# Patient Record
Sex: Male | Born: 1935
Health system: Southern US, Community
[De-identification: ages and names within clinical notes are randomized; demographics above are authoritative.]

## PROBLEM LIST (undated history)

## (undated) DIAGNOSIS — C44219 Basal cell carcinoma of skin of left ear and external auricular canal: Secondary | ICD-10-CM

## (undated) DIAGNOSIS — G250 Essential tremor: Secondary | ICD-10-CM

## (undated) DIAGNOSIS — I2510 Atherosclerotic heart disease of native coronary artery without angina pectoris: Secondary | ICD-10-CM

## (undated) DIAGNOSIS — I10 Essential (primary) hypertension: Secondary | ICD-10-CM

## (undated) DIAGNOSIS — Z860100 Personal history of colon polyps, unspecified: Secondary | ICD-10-CM

## (undated) DIAGNOSIS — K219 Gastro-esophageal reflux disease without esophagitis: Secondary | ICD-10-CM

## (undated) DIAGNOSIS — M4802 Spinal stenosis, cervical region: Secondary | ICD-10-CM

## (undated) DIAGNOSIS — K8050 Calculus of bile duct without cholangitis or cholecystitis without obstruction: Secondary | ICD-10-CM

## (undated) DIAGNOSIS — E785 Hyperlipidemia, unspecified: Secondary | ICD-10-CM

## (undated) DIAGNOSIS — N1832 Chronic kidney disease, stage 3b: Secondary | ICD-10-CM

## (undated) DIAGNOSIS — K5900 Constipation, unspecified: Secondary | ICD-10-CM

## (undated) DIAGNOSIS — I129 Hypertensive chronic kidney disease with stage 1 through stage 4 chronic kidney disease, or unspecified chronic kidney disease: Secondary | ICD-10-CM

## (undated) DIAGNOSIS — C61 Malignant neoplasm of prostate: Secondary | ICD-10-CM

## (undated) DIAGNOSIS — J189 Pneumonia, unspecified organism: Secondary | ICD-10-CM

## (undated) DIAGNOSIS — I4510 Unspecified right bundle-branch block: Secondary | ICD-10-CM

## (undated) DIAGNOSIS — M549 Dorsalgia, unspecified: Secondary | ICD-10-CM

## (undated) DIAGNOSIS — E119 Type 2 diabetes mellitus without complications: Secondary | ICD-10-CM

## (undated) DIAGNOSIS — Z972 Presence of dental prosthetic device (complete) (partial): Secondary | ICD-10-CM

## (undated) DIAGNOSIS — N179 Acute kidney failure, unspecified: Secondary | ICD-10-CM

## (undated) DIAGNOSIS — Z973 Presence of spectacles and contact lenses: Secondary | ICD-10-CM

## (undated) HISTORY — DX: Gastro-esophageal reflux disease without esophagitis: K21.9

## (undated) HISTORY — DX: Essential (primary) hypertension: I10

## (undated) HISTORY — DX: Malignant neoplasm of prostate: C61

## (undated) HISTORY — DX: Hyperlipidemia, unspecified: E78.5

## (undated) HISTORY — PX: COLONOSCOPY: SHX174

## (undated) HISTORY — DX: Essential tremor: G25.0

## (undated) HISTORY — DX: Atherosclerotic heart disease of native coronary artery without angina pectoris: I25.10

---

## 1993-09-09 DIAGNOSIS — M109 Gout, unspecified: Secondary | ICD-10-CM

## 1993-09-09 DIAGNOSIS — I10 Essential (primary) hypertension: Secondary | ICD-10-CM | POA: Insufficient documentation

## 1993-09-09 HISTORY — DX: Essential (primary) hypertension: I10

## 2003-09-10 DIAGNOSIS — G250 Essential tremor: Secondary | ICD-10-CM

## 2003-09-10 HISTORY — DX: Essential tremor: G25.0

## 2004-02-15 HISTORY — PX: CYSTOSCOPY: SUR368

## 2004-11-07 ENCOUNTER — Encounter: Payer: Self-pay | Admitting: Family Medicine

## 2005-02-07 DIAGNOSIS — E119 Type 2 diabetes mellitus without complications: Secondary | ICD-10-CM

## 2005-04-11 ENCOUNTER — Ambulatory Visit: Payer: Self-pay | Admitting: Family Medicine

## 2005-07-12 ENCOUNTER — Ambulatory Visit: Payer: Self-pay | Admitting: Family Medicine

## 2005-08-06 ENCOUNTER — Ambulatory Visit: Payer: Self-pay | Admitting: Family Medicine

## 2005-08-06 LAB — CONVERTED CEMR LAB: Microalbumin U total vol: 17.9 mg/L

## 2005-08-09 ENCOUNTER — Ambulatory Visit: Payer: Self-pay | Admitting: Family Medicine

## 2005-09-09 DIAGNOSIS — I2510 Atherosclerotic heart disease of native coronary artery without angina pectoris: Secondary | ICD-10-CM

## 2005-09-09 DIAGNOSIS — C61 Malignant neoplasm of prostate: Secondary | ICD-10-CM

## 2005-09-09 HISTORY — DX: Atherosclerotic heart disease of native coronary artery without angina pectoris: I25.10

## 2005-09-09 HISTORY — PX: CORONARY ARTERY BYPASS GRAFT: SHX141

## 2005-09-09 HISTORY — DX: Malignant neoplasm of prostate: C61

## 2005-09-10 ENCOUNTER — Ambulatory Visit: Payer: Self-pay | Admitting: Family Medicine

## 2005-09-18 ENCOUNTER — Ambulatory Visit (HOSPITAL_COMMUNITY): Admission: RE | Admit: 2005-09-18 | Discharge: 2005-09-18 | Payer: Self-pay | Admitting: Urology

## 2005-09-18 HISTORY — PX: PROSTATE CRYOABLATION: SUR358

## 2005-12-08 DIAGNOSIS — E785 Hyperlipidemia, unspecified: Secondary | ICD-10-CM

## 2005-12-08 DIAGNOSIS — E782 Mixed hyperlipidemia: Secondary | ICD-10-CM | POA: Insufficient documentation

## 2005-12-08 HISTORY — DX: Hyperlipidemia, unspecified: E78.5

## 2005-12-31 ENCOUNTER — Ambulatory Visit: Payer: Self-pay | Admitting: Family Medicine

## 2006-01-02 ENCOUNTER — Ambulatory Visit: Payer: Self-pay | Admitting: Family Medicine

## 2006-01-06 ENCOUNTER — Ambulatory Visit: Payer: Self-pay | Admitting: Cardiovascular Disease

## 2006-01-08 ENCOUNTER — Ambulatory Visit: Payer: Self-pay | Admitting: Family Medicine

## 2006-01-15 ENCOUNTER — Ambulatory Visit: Payer: Self-pay | Admitting: Family Medicine

## 2006-01-24 ENCOUNTER — Ambulatory Visit: Payer: Self-pay

## 2006-01-24 ENCOUNTER — Inpatient Hospital Stay (HOSPITAL_COMMUNITY): Admission: EM | Admit: 2006-01-24 | Discharge: 2006-02-03 | Payer: Self-pay | Admitting: Emergency Medicine

## 2006-01-24 ENCOUNTER — Ambulatory Visit: Payer: Self-pay | Admitting: Internal Medicine

## 2006-01-24 HISTORY — PX: OTHER SURGICAL HISTORY: SHX169

## 2006-01-29 ENCOUNTER — Encounter: Payer: Self-pay | Admitting: Internal Medicine

## 2006-02-27 ENCOUNTER — Ambulatory Visit: Payer: Self-pay | Admitting: Family Medicine

## 2006-02-27 LAB — CONVERTED CEMR LAB
Hgb A1c MFr Bld: 6.1 %
Microalbumin U total vol: 7.7 mg/L

## 2006-03-03 ENCOUNTER — Ambulatory Visit: Payer: Self-pay | Admitting: Family Medicine

## 2006-03-04 ENCOUNTER — Ambulatory Visit: Payer: Self-pay | Admitting: Cardiovascular Disease

## 2006-03-27 ENCOUNTER — Ambulatory Visit: Payer: Self-pay | Admitting: Family Medicine

## 2006-03-27 ENCOUNTER — Ambulatory Visit: Payer: Self-pay | Admitting: Cardiovascular Disease

## 2006-05-01 ENCOUNTER — Ambulatory Visit: Payer: Self-pay | Admitting: Cardiovascular Disease

## 2006-06-02 ENCOUNTER — Ambulatory Visit: Payer: Self-pay | Admitting: Family Medicine

## 2006-06-05 ENCOUNTER — Ambulatory Visit: Payer: Self-pay | Admitting: Family Medicine

## 2006-08-06 ENCOUNTER — Encounter: Payer: Self-pay | Admitting: Family Medicine

## 2006-11-06 ENCOUNTER — Ambulatory Visit: Payer: Self-pay | Admitting: Cardiovascular Disease

## 2006-12-18 ENCOUNTER — Encounter: Payer: Self-pay | Admitting: Family Medicine

## 2006-12-18 DIAGNOSIS — R251 Tremor, unspecified: Secondary | ICD-10-CM

## 2006-12-18 DIAGNOSIS — Z8546 Personal history of malignant neoplasm of prostate: Secondary | ICD-10-CM | POA: Insufficient documentation

## 2006-12-18 DIAGNOSIS — I2510 Atherosclerotic heart disease of native coronary artery without angina pectoris: Secondary | ICD-10-CM | POA: Insufficient documentation

## 2006-12-18 DIAGNOSIS — Z87448 Personal history of other diseases of urinary system: Secondary | ICD-10-CM

## 2007-01-05 ENCOUNTER — Ambulatory Visit: Payer: Self-pay | Admitting: Family Medicine

## 2007-01-05 LAB — CONVERTED CEMR LAB
ALT: 15 units/L (ref 0–40)
Albumin: 3.5 g/dL (ref 3.5–5.2)
Alkaline Phosphatase: 55 units/L (ref 39–117)
Bilirubin, Direct: 0.2 mg/dL (ref 0.0–0.3)
CO2: 29 meq/L (ref 19–32)
Creatinine,U: 59.7 mg/dL
Eosinophils Absolute: 0.2 10*3/uL (ref 0.0–0.6)
Eosinophils Relative: 3.2 % (ref 0.0–5.0)
GFR calc non Af Amer: 63 mL/min
Glucose, Bld: 119 mg/dL — ABNORMAL HIGH (ref 70–99)
Hgb A1c MFr Bld: 6.2 % — ABNORMAL HIGH (ref 4.6–6.0)
LDL Cholesterol: 111 mg/dL — ABNORMAL HIGH (ref 0–99)
Lymphocytes Relative: 38.2 % (ref 12.0–46.0)
MCHC: 33.8 g/dL (ref 30.0–36.0)
Monocytes Absolute: 0.6 10*3/uL (ref 0.2–0.7)
Monocytes Relative: 9.5 % (ref 3.0–11.0)
Neutro Abs: 3 10*3/uL (ref 1.4–7.7)
Neutrophils Relative %: 49 % (ref 43.0–77.0)
PSA: 1.55 ng/mL (ref 0.10–4.00)
Platelets: 229 10*3/uL (ref 150–400)
Potassium: 4.5 meq/L (ref 3.5–5.1)
RBC: 4.14 M/uL — ABNORMAL LOW (ref 4.22–5.81)
RDW: 14.7 % — ABNORMAL HIGH (ref 11.5–14.6)
Sodium: 143 meq/L (ref 135–145)

## 2007-01-07 ENCOUNTER — Ambulatory Visit: Payer: Self-pay | Admitting: Family Medicine

## 2007-02-18 ENCOUNTER — Ambulatory Visit: Payer: Self-pay | Admitting: Family Medicine

## 2007-02-19 LAB — CONVERTED CEMR LAB: Cholesterol: 204 mg/dL (ref 0–200)

## 2007-04-02 ENCOUNTER — Encounter (INDEPENDENT_AMBULATORY_CARE_PROVIDER_SITE_OTHER): Payer: Self-pay | Admitting: *Deleted

## 2007-04-08 ENCOUNTER — Ambulatory Visit: Payer: Self-pay | Admitting: Family Medicine

## 2007-04-08 LAB — CONVERTED CEMR LAB
ALT: 15 units/L (ref 0–53)
Cholesterol: 116 mg/dL (ref 0–200)
HDL: 32.8 mg/dL — ABNORMAL LOW (ref >39.0)

## 2007-04-10 ENCOUNTER — Ambulatory Visit: Payer: Self-pay | Admitting: Family Medicine

## 2007-04-22 ENCOUNTER — Ambulatory Visit: Payer: Self-pay | Admitting: Cardiovascular Disease

## 2007-06-26 ENCOUNTER — Ambulatory Visit: Payer: Self-pay | Admitting: Family Medicine

## 2008-02-02 ENCOUNTER — Ambulatory Visit: Payer: Self-pay | Admitting: Family Medicine

## 2008-02-03 LAB — CONVERTED CEMR LAB
ALT: 15 units/L (ref 0–53)
AST: 25 units/L (ref 0–37)
Alkaline Phosphatase: 49 units/L (ref 39–117)
Basophils Absolute: 0 10*3/uL (ref 0.0–0.1)
Basophils Relative: 0.3 % (ref 0.0–1.0)
Bilirubin, Direct: 0.1 mg/dL (ref 0.0–0.3)
Calcium: 9.3 mg/dL (ref 8.4–10.5)
Cholesterol: 148 mg/dL (ref 0–200)
Eosinophils Absolute: 0.1 10*3/uL (ref 0.0–0.7)
Eosinophils Relative: 1.9 % (ref 0.0–5.0)
GFR calc Af Amer: 83 mL/min
GFR calc non Af Amer: 69 mL/min
HDL: 40.1 mg/dL (ref >39.0)
LDL Cholesterol: 79 mg/dL (ref 0–99)
Lymphocytes Relative: 31.9 % (ref 12.0–46.0)
MCHC: 33.7 g/dL (ref 30.0–36.0)
MCV: 101.8 fL — ABNORMAL HIGH (ref 78.0–100.0)
Monocytes Absolute: 0.4 10*3/uL (ref 0.1–1.0)
Monocytes Relative: 8.6 % (ref 3.0–12.0)
Potassium: 4.6 meq/L (ref 3.5–5.1)
RBC: 4.08 M/uL — ABNORMAL LOW (ref 4.22–5.81)
RDW: 14 % (ref 11.5–14.6)
Total Bilirubin: 1.1 mg/dL (ref 0.3–1.2)
Total CHOL/HDL Ratio: 3.7
VLDL: 29 mg/dL (ref 0–40)
WBC: 5.1 10*3/uL (ref 4.5–10.5)

## 2008-02-23 ENCOUNTER — Ambulatory Visit: Payer: Self-pay | Admitting: Family Medicine

## 2008-06-10 ENCOUNTER — Ambulatory Visit: Payer: Self-pay | Admitting: Cardiovascular Disease

## 2008-11-03 ENCOUNTER — Telehealth: Payer: Self-pay | Admitting: Family Medicine

## 2008-12-14 ENCOUNTER — Telehealth: Payer: Self-pay | Admitting: Family Medicine

## 2009-03-22 ENCOUNTER — Ambulatory Visit: Payer: Self-pay | Admitting: Family Medicine

## 2009-03-22 LAB — CONVERTED CEMR LAB
ALT: 13 units/L (ref 0–53)
BUN: 23 mg/dL (ref 6–23)
Basophils Absolute: 0 10*3/uL (ref 0.0–0.1)
Basophils Relative: 0.3 % (ref 0.0–3.0)
Bilirubin, Direct: 0.1 mg/dL (ref 0.0–0.3)
Calcium: 8.9 mg/dL (ref 8.4–10.5)
Chloride: 107 meq/L (ref 96–112)
Eosinophils Relative: 3.5 % (ref 0.0–5.0)
GFR calc non Af Amer: 52.07 mL/min (ref >60)
Glucose, Bld: 108 mg/dL — ABNORMAL HIGH (ref 70–99)
HCT: 38.2 % — ABNORMAL LOW (ref 39.0–52.0)
Hemoglobin: 13.1 g/dL (ref 13.0–17.0)
Hgb A1c MFr Bld: 6.4 % (ref 4.6–6.5)
LDL Cholesterol: 95 mg/dL (ref 0–99)
Lymphs Abs: 2.6 10*3/uL (ref 0.7–4.0)
Microalb, Ur: 62.4 mg/dL — ABNORMAL HIGH (ref 0.0–1.9)
Neutro Abs: 3 10*3/uL (ref 1.4–7.7)
Neutrophils Relative %: 46.3 % (ref 43.0–77.0)
PSA: 2.21 ng/mL (ref 0.10–4.00)
RDW: 13.9 % (ref 11.5–14.6)
Total Bilirubin: 1.2 mg/dL (ref 0.3–1.2)
Triglycerides: 137 mg/dL (ref 0.0–149.0)
Uric Acid, Serum: 6.1 mg/dL (ref 4.0–7.8)
VLDL: 27.4 mg/dL (ref 0.0–40.0)
WBC: 6.4 10*3/uL (ref 4.5–10.5)

## 2009-03-23 ENCOUNTER — Ambulatory Visit: Payer: Self-pay | Admitting: Family Medicine

## 2009-03-23 DIAGNOSIS — E559 Vitamin D deficiency, unspecified: Secondary | ICD-10-CM | POA: Insufficient documentation

## 2009-03-23 DIAGNOSIS — E039 Hypothyroidism, unspecified: Secondary | ICD-10-CM | POA: Insufficient documentation

## 2009-05-18 ENCOUNTER — Encounter (INDEPENDENT_AMBULATORY_CARE_PROVIDER_SITE_OTHER): Payer: Self-pay | Admitting: *Deleted

## 2009-07-25 ENCOUNTER — Encounter: Payer: Self-pay | Admitting: Family Medicine

## 2009-08-28 ENCOUNTER — Ambulatory Visit: Payer: Self-pay | Admitting: Family Medicine

## 2009-08-30 ENCOUNTER — Ambulatory Visit: Payer: Self-pay | Admitting: Family Medicine

## 2009-10-31 ENCOUNTER — Encounter (INDEPENDENT_AMBULATORY_CARE_PROVIDER_SITE_OTHER): Payer: Self-pay | Admitting: *Deleted

## 2010-03-21 ENCOUNTER — Telehealth (INDEPENDENT_AMBULATORY_CARE_PROVIDER_SITE_OTHER): Payer: Self-pay | Admitting: *Deleted

## 2010-03-23 ENCOUNTER — Ambulatory Visit: Payer: Self-pay | Admitting: Family Medicine

## 2010-03-23 LAB — CONVERTED CEMR LAB
ALT: 13 units/L (ref 0–53)
Albumin: 3.6 g/dL (ref 3.5–5.2)
Alkaline Phosphatase: 61 units/L (ref 39–117)
Bilirubin, Direct: 0.1 mg/dL (ref 0.0–0.3)
Calcium: 9 mg/dL (ref 8.4–10.5)
Chloride: 106 meq/L (ref 96–112)
Cholesterol: 170 mg/dL (ref 0–200)
Creatinine, Ser: 1.5 mg/dL (ref 0.4–1.5)
Creatinine,U: 36.8 mg/dL
Microalb Creat Ratio: 87 mg/g — ABNORMAL HIGH (ref 0.0–30.0)
Potassium: 4.3 meq/L (ref 3.5–5.1)
TSH: 5.18 microintl units/mL (ref 0.35–5.50)
Total Bilirubin: 0.8 mg/dL (ref 0.3–1.2)
Triglycerides: 188 mg/dL — ABNORMAL HIGH (ref 0.0–149.0)

## 2010-03-26 ENCOUNTER — Ambulatory Visit: Payer: Self-pay | Admitting: Family Medicine

## 2010-10-09 NOTE — Letter (Signed)
Summary: [place] No Show Letter  [place] [facility]  [address] [place], [place] [address]   Phone: [phone]  Fax: [phone]    10/31/2009 MRN: [id number]  TAVIN VERNET [address] [place], [place]  [address]   Dear Mr. Benzel,   Our records indicate that you missed your scheduled appointment with ___lab__________________ on __2.22.11__________.  Please contact this office to reschedule your appointment as soon as possible.  It is important that you keep your scheduled appointments with your physician, so we can provide you the best care possible.  Please be advised that there may be a charge for "no show" appointments.    Sincerely,   [place] [facility]

## 2010-10-09 NOTE — Progress Notes (Signed)
----   Converted from flag ---- ---- 03/21/2010 10:14 AM, Crawford Givens MD wrote: TSH 790.6 PSA v76.44 cmet, lipid, MALB, A1c 250.00   ---- 03/21/2010 10:09 AM, Mills Koller wrote: Patient coming in the AM for CPX labs, need orders and dx please, [name] ------------------------------

## 2010-10-09 NOTE — Assessment & Plan Note (Signed)
Summary: CPX / LFW   Vital Signs:  Patient profile:   77 year old male Height:      70 inches Weight:      204.75 pounds BMI:     29.48 Temp:     98.5 degrees F oral Pulse rate:   68 / minute Pulse rhythm:   regular BP sitting:   156 / 80  (left arm) Cuff size:   regular  Vitals Entered By: Delilah Shan CMA Estha Few Dull) (March 26, 2010 11:47 AM) CC: CPX.  Colonoscopy at [facility]? date, Preventive Care   History of Present Illness: Recent labs reviewed with patient.   Diabetes:  Using medications without difficulties: yes Hypoglycemic episodes:no Hyperglycemic episodes:no Feet problems:no Blood Sugars averaging:  ~110 in AM eye exam within last year: yes  Hypertension:      Using medication without problems or lightheadedness: yes Chest pain with exertion:no Edema:no Short of breath:no Average home BPs: usually 110-140s/75-85 Other issues: usually higher in MD office  Elevated Cholesterol: Using medications without problems:yes Muscle aches: occ.  no clear relation to statin.   Other complaints: no  Allergies: 1)  ! Sulfa  Past History:  Past Surgical History: Last updated: 01/07/2007 HOSP GLOMERULONEPHRITIS  "REST" 1965 ABD CT WITH & WITHOUT lEFT RENAL CYST 3-4 CM/02/15/2004 CYSTOSCOPY MOD BPH , MOD TRIBEC ? 02/15/2004 CRYOTHERAPY OF PROSTATE 2ND --PROSTATE CA. 09/18/2005 COLONOSCOPY/ DIVERTICS 2 POLYPS B=9 07/23/2000         10 yrs ADENOSINE MYOVIEW ISCHEMIA BY EKG--- CATH. 01/24/2006 CABG x 4 : 01/31/2006 ABD CT NEGATIVE AAA 01/25/2006  Family History: Last updated: 04-21-09 Father:DECEASED AGE 71 NATURAL CAUSES  Mother: DECEASED 55YOA ? HEART /MULTIPLE TUMORS INSIDE Siblings: 1 BROTHER DIED 69 YOA PROSTATE CANCER BROTHER A 62  BACK PROBLEMS HYPOTHYROID SISTERS 4 : 2 WITH DM CV: + MOTHER (HEART) HBP + SELF DM + SELF; SISTERS X 2 PROSTATE CANCER: + BROTHER, + SELF DEPRESSION: NEG. ETOH/DRUG ABUSE: NEG. STROKE: NEG.  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 RETIRED P[employer] Never Smoked Alcohol use-no Drug use-no  Risk Factors: Alcohol Use: 0 (04-21-2009) Caffeine Use: 1 (Apr 21, 2009) Exercise: yes (21-Apr-2009)  Risk Factors: Smoking Status: never (21-Apr-2009) Passive Smoke Exposure: no (Apr 21, 2009)  Past Medical History: Diabetes mellitus, type II:02/2005 Gout :1995 Hyperlipidemia04/2007 Hypertension: : 1995 benign tremor 2005 CAD s/p CABG Prostate CA s/p treatment, followed by urology.   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 RETIRED [employer] Never Smoked Alcohol use-no Drug use-no  Review of Systems       See HPI.  Otherwise noncontributory.  Feeling well o/w.   Physical Exam  General:  GEN: nad, alert and oriented HEENT: mucous membranes moist NECK: supple w/o LA CV: rrr.  no murmur PULM: ctab, no inc wob ABD: soft, +bs EXT: no edema SKIN: no acute rash  resting tremor noted episodically during exam.  No motor changes o/w.  DTRs wnl x4.  Prostate:  no nodules and no asymmetry.   stool heme neg  Diabetes Management Exam:    Foot Exam (with socks and/or shoes not present):       Sensory-Pinprick/Light touch:          Left medial foot (L-4): normal          Left dorsal foot (L-5): normal  Left lateral foot (S-1): normal          Right medial foot (L-4): normal          Right dorsal foot (L-5): normal          Right lateral foot (S-1): normal       Sensory-Monofilament:          Left foot: normal          Right foot: normal       Inspection:          Left foot: normal          Right foot: normal       Nails:          Left foot: normal          Right foot: normal   Impression & Recommendations:  Problem # 1:  Preventive Health Care  (ICD-V70.0) See below.  Up to date except for shingles vaccine.  Pt to check on coverage.   Problem # 2:  HYPERTENSION (ICD-401.9) No change in meds.  controlled based on outside numbers.  His updated medication list for this problem includes:    Coreg Cr 20 Mg Cp24 (Carvedilol phosphate) .Marland Kitchen... 1 daily by mouth    Lisinopril 20 Mg Tabs (Lisinopril) .Marland Kitchen... Take one by mouth daily  Problem # 3:  HYPERLIPIDEMIA (ICD-272.4) No change in meds.  His updated medication list for this problem includes:    Crestor 10 Mg Tabs (Rosuvastatin calcium) .Marland Kitchen... 1 tablet by mouth at bedtime  Problem # 4:  DIABETES MELLITUS, TYPE II (ICD-250.00) controlled, no change in meds.  MALB elevated; treatment is to continue to control DM2.   The following medications were removed from the medication list:    Avandia 4 Mg Tabs (Rosiglitazone maleate) .Marland Kitchen... 1daily by mouth His updated medication list for this problem includes:    Aspir-low 81 Mg Tbec (Aspirin) .Marland Kitchen... 1 daily by mouth    Lisinopril 20 Mg Tabs (Lisinopril) .Marland Kitchen... Take one by mouth daily    Actos 15 Mg Tabs (Pioglitazone hcl) ..... One tab by mouth once daily    Actos 15 Mg Tabs (Pioglitazone hcl) .Marland Kitchen... Take 1 tablet by mouth once a day  Complete Medication List: 1)  Aspir-low 81 Mg Tbec (Aspirin) .Marland Kitchen.. 1 daily by mouth 2)  Crestor 10 Mg Tabs (Rosuvastatin calcium) .Marland Kitchen.. 1 tablet by mouth at bedtime 3)  Allopurinol 300 Mg Tabs (Allopurinol) .Marland Kitchen.. 1 daily by mouth 4)  Coreg Cr 20 Mg Cp24 (Carvedilol phosphate) .Marland Kitchen.. 1 daily by mouth 5)  Nexium 40 Mg Cpdr (Esomeprazole magnesium) .... One tab 45 mins before brfst 6)  Mysoline 50 Mg Tabs (Primidone) .... 1/2 tablet daily by mouth 7)  Ascensia Contour Test Strp (Glucose blood) .... Use as directed 8)  Lisinopril 20 Mg Tabs (Lisinopril) .... Take one by mouth daily 9)  Actos 15 Mg Tabs (Pioglitazone hcl) .... One tab by mouth once daily 10)  Actos 15 Mg Tabs (Pioglitazone hcl) .... Take 1 tablet by mouth once  a day  Colorectal Screening:  Current Recommendations:    Hemoccult: NEG X 1 today  PSA Screening:    PSA: 2.90  (03/23/2010)  Immunization & Chemoprophylaxis:    Tetanus vaccine: Td  (04/09/2000)    Influenza vaccine: Fluvax 3+  (06/26/2007)    Pneumovax: Pneumovax  (07/10/2005)  Patient Instructions: 1)  Please schedule a follow-up appointment in 6 months.  Please stop taking the crestor for a few days see  if the muscle aches get better. If they do, let me know.  Check with your insurance to see if they will cover the shingles shot.   Prescriptions: ACTOS 15 MG TABS (PIOGLITAZONE HCL) one tab by mouth once daily  #90 x 4   Entered and Authorized by:   Crawford Givens MD   Signed by:   Crawford Givens MD on 03/26/2010   Method used:   Faxed to ...       [facility] Environmental education officer)       [address] [address]       [place], [place]  [address]       Ph: [phone]       Fax: [phone]   RxID:   [id number] LISINOPRIL 20 MG TABS (LISINOPRIL) take one by mouth daily  #90 x 4   Entered and Authorized by:   Crawford Givens MD   Signed by:   Crawford Givens MD on 03/26/2010   Method used:   Faxed to ...       [facility] Environmental education officer)       [address] [address]       [place], [place]  [address]       Ph: [phone]       Fax: [phone]   RxID:   [id number] ASCENSIA CONTOUR TEST   STRP (GLUCOSE BLOOD) use as directed  #100 x 0   Entered and Authorized by:   Crawford Givens MD   Signed by:   Crawford Givens MD on 03/26/2010   Method used:   Faxed to ...       [facility] Environmental education officer)       [address] [address]       [place], [place]  [address]       Ph: [phone]       Fax: [phone]   RxID:   [id number] MYSOLINE 50 MG TABS (PRIMIDONE) 1/2 tablet daily by mouth  #45 x 4   Entered and Authorized by:   Crawford Givens MD   Signed by:   Crawford Givens MD on 03/26/2010   Method used:   Faxed to ...       [facility] Environmental education officer)       [address] [address]       [place], [place]  [address]       Ph:  [phone]       Fax: [phone]   RxID:   [id number] NEXIUM 40 MG CPDR (ESOMEPRAZOLE MAGNESIUM) one tab 45 mins before brfst  #90 x 4   Entered and Authorized by:   Crawford Givens MD   Signed by:   Crawford Givens MD on 03/26/2010   Method used:   Faxed to ...       [facility] Environmental education officer)       [address] [address]       [place], [place]  [address]       Ph: [phone]       Fax: [phone]   RxID:   [id number] COREG CR 20 MG CP24 (CARVEDILOL PHOSPHATE) 1 daily by mouth  #90 x 4   Entered and Authorized by:   Crawford Givens MD   Signed by:   Crawford Givens MD on 03/26/2010   Method used:   Faxed to ...       [facility] Environmental education officer)       [address] [address]       [place], [place]  [address]       Ph: [phone]       Fax: [phone]  RxID:   [id number] ALLOPURINOL 300 MG TABS (ALLOPURINOL) 1 daily by mouth  #90 x 4   Entered and Authorized by:   Crawford Givens MD   Signed by:   Crawford Givens MD on 03/26/2010   Method used:   Faxed to ...       [facility] Environmental education officer)       [address] [address]       [place], [place]  [address]       Ph: [phone]       Fax: [phone]   RxID:   [id number] CRESTOR 10 MG TABS (ROSUVASTATIN CALCIUM) 1 tablet by mouth at bedtime  #90 x 4   Entered and Authorized by:   Crawford Givens MD   Signed by:   Crawford Givens MD on 03/26/2010   Method used:   Faxed to ...       [facility] Environmental education officer)       [address] [address]       [place], [place]  [address]       Ph: [phone]       Fax: [phone]   RxID:   [id number]   Current Allergies (reviewed today): ! SULFA      Prevention & Chronic Care Immunizations   Influenza vaccine: Fluvax 3+  (06/26/2007)   Influenza vaccine deferral: Not available  (03/26/2010)    Tetanus booster: 04/09/2000: Td    Pneumococcal vaccine: Pneumovax  (07/10/2005)    H. zoster vaccine: Not documented  Colorectal Screening   Hemoccult: Not documented   Hemoccult action/deferral: NEG X 1 today   (03/26/2010)    Colonoscopy: Not documented  Other Screening   PSA: 2.90  (03/23/2010)   Smoking status: never  (03/23/2009)  Diabetes Mellitus   HgbA1C: 6.2  (03/23/2010)    Eye exam: normal  (07/25/2009)   Eye exam due: 08/2010    Foot exam: yes  (03/26/2010)   High risk foot: Not documented   Foot care education: Not documented    Urine microalbumin/creatinine ratio: 87.0  (03/23/2010)  Lipids   Total Cholesterol: 170  (03/23/2010)   LDL: 88  (03/23/2010)   LDL Direct: 104.4  (02/18/2007)   HDL: 44.80  (03/23/2010)   Triglycerides: 188.0  (03/23/2010)    SGOT (AST): 17  (03/23/2010)   SGPT (ALT): 13  (03/23/2010)   Alkaline phosphatase: 61  (03/23/2010)   Total bilirubin: 0.8  (03/23/2010)    Lipid flowsheet reviewed?: Yes  Hypertension   Last Blood Pressure: 156 / 80  (03/26/2010)   Serum creatinine: 1.5  (03/23/2010)   Serum potassium 4.3  (03/23/2010)    Hypertension flowsheet reviewed?: Yes  Self-Management Support :    Diabetes self-management support: Not documented    Hypertension self-management support: Not documented    Lipid self-management support: Not documented

## 2011-01-22 NOTE — Assessment & Plan Note (Signed)
[facility]                            CARDIOLOGY OFFICE NOTE   NAME:[name] [name], [name] [name]                   MRN:          [id number]  DATE:06/10/2008                            DOB:          Feb 09, 1929    [name] is seen today in followup.  He is status post coronary  bypass surgery in 2006.  He had a nonischemic Myoview in 2007.  His  coronary risk factors include hypertension, diabetes, and  hyperlipidemia.   He sees Dr. Hetty Ely for his primary care needs.  He has been doing  well.  He is not having any significant chest pain, PND, orthopnea.  He  continues to golf on occasion.   His [name] recent LDL cholesterol was 111.   He is on Crestor 10 mg a day and has been compliant.   The patient's review of systems is remarkable for continued body tremor.  He is on primidone for this.   His sugars have been reasonable with hemoglobin A1c in the less than 7  range, blood pressure continues to be little bit elevated.  There is  some confusion in his lisinopril as to whether he is on 10 mg or 20 mg.  We will try to look this up in the computer, use as a transcript.  I  told him I thought he should probably be on little bit more.  He  continues to have microalbuminuria in his urine and I think increasing  his ACE inhibitor would be helpful here as well.   His meds include primidone 25 mg a day, Avandia 4 mg a day, Crestor 10 a  day, aspirin a day, lisinopril dose to be determined and doubled, and  Coreg CR 20 a day.   He has had a flu shot.   His exam is remarkable for weight that is up from 195-206, blood  pressure 160/80, pulse 60 and regular, respiratory rate 14, afebrile.  HEENT, unremarkable.  Carotids normal without bruit, no lymphadenopathy,  thyromegaly, or JVP elevation.  Lungs are clear, good diaphragmatic  motion.  No wheezing.  S1 and S2, normal heart sounds, PMI normal.  Abdomen is benign.  Bowel sounds positive.  No AAA, no  tenderness, no  bruit, no hepatosplenomegaly, hepatojugular reflux.  Distal pulses are  intact.  No edema.  Neuro, nonfocal.  Skin, warm and dry.  No muscular  weakness.   EKG shows sinus rhythm with right bundle-branch block.  No acute  changes.   IMPRESSION:  1. Coronary disease, previous coronary artery bypass graft.  Continue      aspirin, beta-blocker.  2. Hypertension, poorly controlled.  We will look up his lisinopril      dose in the EMR and double it.  He will continue low-salt diet.  3. Essential obesity and weight gain.  Decrease caloric intake,      increase exercise activity, diet prescription given.  4. Chronic right bundle-branch block, stable.  No evidence of high-      grade heart block or syncope.  5. Diabetes,  follow up with Dr. Hetty Ely, hemoglobin A1c in target  range.  6. Continued microalbuminuria, continue angiotensin-converting enzyme      inhibitor to protect kidneys.  7. Hyperlipidemia.  He may benefit from a switch to Vytorin.  We will      leave this up Dr. Hetty Ely, his target LDL in the setting of      coronary bypass surgery should be 70 or 80.  8. History of gout.  Continue allopurinol, no recent flares.  9. Body tremor.  Continue primidone, beta-blocker probably also helps,      no evidence of Parkinson disease.  10.Memory changes.  I have noticed with [name] [name] over the last 2 visits      he has significant memory problems.  I suspect he is developing      some Alzheimer disease.  I will leave it up to Dr. Hetty Ely to see      if he would like to start a trial of Aricept or Namenda.  I do not      think it is related to his tremor.     Noralyn Pick. Eden Emms, MD, [facility]  Electronically Signed    PCN/MedQ  DD: 06/10/2008  DT: 06/11/2008  Job #: [id number]

## 2011-01-22 NOTE — Assessment & Plan Note (Signed)
[facility]                            CARDIOLOGY OFFICE NOTE   NAME:Couchman, HANNAH CRILL                   MRN:          [id number]  DATE:04/22/2007                            DOB:          1929/01/04    Mr. Muckleroy returns today for followup. He is status post coronary  artery bypass surgery in 2006. He had a nonischemic Myoview in 2007. He  is a diabetic. His hemoglobin A1c has been 6.1. He has been doing well.  His blood pressure has always been a little bit elevated when he comes  to the office, but he takes it at home and it usually runs 120 systolic.   He has been compliant with his low cholesterol and low sugar diets. He  has been somewhat active, although the heat bothers him. He is playing a  bit of golf.   REVIEW OF SYSTEMS:  Is otherwise negative.   CURRENT MEDICATIONS:  1. Allopurinol 300 a day.  2. Primidone 25 a day.  3. Avandia 4 a day.  4. Crestor 10 a day.  5. Aspirin a day.  6. Lisinopril 20 a day.  7. Coreg CR 20 a day.  Last time I saw him, we increased his lisinopril to 20 a day for his  blood pressure.   PHYSICAL EXAMINATION:  Remarkable for an elderly, white male who looks  younger than his stated age. Affect is appropriate. Weight is 195. Blood  pressure 150/80, pulse 56 and regular. Respiratory rate is 14. He is  afebrile.  HEENT: Is normal.  NECK: Supple. There is no thyromegaly. No lymphadenopathy. No JVP  elevation. There are no bruits.  LUNGS:  Are clear with good diaphragmatic motion. No wheezing.  There is an S1, S2 with normal heart sounds. PMI is normal.  ABDOMEN: Is benign. There are no renal bruits. No tenderness. Bowel  sounds are positive. No hepatosplenomegaly or hepatojugular reflux.  Distal pulses are intact. No edema. Femorals are +3, PT's are +3.  NEURO: Nonfocal.  SKIN: Warm and dry.  There is no muscular weakness.   IMPRESSION:  1. Stable status post coronary artery bypass graft with no  angina. He      has nitroglycerine at home in case he needs to continue aspirin and      beta-blocker therapy. Nonischemic Myoview a year ago. No need for      repeat stress testing in an asymptomatic patient for probably 2-3      years.  2. Hypertension. Currently fairly well-controlled. I think there is a      bit of white coat hypertension here. He will continue to take his      blood pressure at home and refrain from salt.  3. Hyperlipidemia. Followup with Dr.  Hetty Ely. His last LFTs done in      July were normal. He will continue his Crestor.  4. Diabetes. Hemoglobin A1c in the excellent range of 6.1. Continue      current medication. Followup with Dr.  Hetty Ely for this as well.   Overall, I think he is doing well and I will see him back  in a year.  Refills on his prescriptions for 90 days of lisinopril 20 mg and Coreg  CR 20 were given.     Noralyn Pick. Eden Emms, MD, [facility]  Electronically Signed    PCN/MedQ  DD: 04/22/2007  DT: 04/23/2007  Job #: [id number]

## 2011-01-25 NOTE — Consult Note (Signed)
NAME:  [name] [name], COOPMAN NO.:  [id number]   MEDICAL RECORD NO.:  [id number]          PATIENT TYPE:  INP   LOCATION:  3702                         FACILITY:  [facility]   PHYSICIAN:  Kerin Perna, M.D.  DATE OF BIRTH:  Nov 29, 1928   DATE OF CONSULTATION:  01/28/2006  DATE OF DISCHARGE:                                   CONSULTATION   PRIMARY CARE PHYSICIAN:  Laurita Quint, M.D.   CONSULTANT:  Kerin Perna, M.D.   REASON FOR CONSULTATION:  Severe three-vessel coronary artery disease,  unstable angina.   CHIEF COMPLAINT:  Chest pain.   HISTORY OF PRESENT ILLNESS:  [name] [name] is a 77 year old gentleman who has  had several weeks of exertional dyspnea and chest tightness.  Associated  with these symptoms has been a decrease in exercise tolerance.  The patient  underwent a stress test using the standard Bruce protocol and developed  chest pain at a heart rate of only 100 beats per minute.  The patient had a  significantly abnormal EKG and the study was terminated.  No Cardiolite  imaging was performed.  The patient was then admitted to the hospital where  cardiac enzymes were checked and were negative.  He was placed on heparin  and nitroglycerin.  He underwent diagnostic cardiac catheterization  yesterday which demonstrated severe three-vessel coronary artery disease.  His proximal LAD had a 90% stenosis.  His proximal right coronary had a 95%  stenosis.  His circumflex had an 80% stenosis of the OM1 vessels and a 99%  stenosis of the distal posterolateral circumflex which filled via  collaterals.  His ejection fraction was 60%.  Left ventricular end-diastolic  pressure was 15 mmHg.  Because of the patient's symptoms and coronary  anatomy and severe multivessel disease, he was felt to be a candidate for  surgical revascularization.  The patient is currently stable on IV heparin  following his cardiac catheterization.  He underwent a CT of the abdomen due  to  potential abdominal aneurysm seem on aortogram at catheterization,  however, the CT scan is negative other than some renal cystic disease.  His  carotid duplex study shows no significant carotid stenosis.  A 2-D echo is  pending.   PAST MEDICAL HISTORY:  1.  Type 2 diabetes mellitus with a hemoglobin A1c of 7.1.  2.  Hypertension.  3.  Adenocarcinoma of the prostate status post cryotherapy, January 2007.  4.  Gout.  5.  History of glomerulonephritis 40 years ago with persistent microscopic      hematuria but normal BUN and creatinine.  6.  Benign tremor.  7.  No known drug allergies.   MEDICATIONS:  1.  Allopurinol 300 mg daily.  2.  Inderal 80 mg daily.  3.  Lisinopril HCT 20/12.5 daily.  4.  Primidone (Mysoline) 25 mg daily.  5.  Avandia 4 m daily.  6.  Lipitor 20 mg daily.  7.  Aspirin 325 mg daily.  8.  He is also on IV heparin protocol at this time.   SOCIAL HISTORY:  The patient is a retired Airline pilot.  He is married  and has children and grandchildren.  He never smoked and he does not drink.  His main activity now has been golf.   FAMILY HISTORY:  Positive for diabetes, positive for atherosclerotic heart  disease.   REVIEW OF SYSTEMS:  Constitutional review is negative for fever or weight  loss.  HEENT review is negative for active dental problems or difficulty  swallowing.  Musculoskeletal review is negative for chest trauma, broken  ribs or extremity fracture.  Cardiac review is positive for his unstable  angina, severe three-vessel coronary artery disease, negative for myocardial  infarction or cardiac murmur.  GI review is negative for hepatitis, jaundice  or blood per rectum.  Urologic review is positive for prostate cancer.  Status post cryotherapy of the prostate gland.  He also has some renal  cystic disease.  He also has microscopic hematuria from his  glomerulonephritis following a strep throat infection 40 years ago.  His  neurologic review is  negative for stroke or seizure and positive for a  benign tremor.  Vascular review is negative for DVT, claudication or TIA.   PHYSICAL EXAM:  The patient is 5 feet 10 inches and weighs 203 pounds.  Blood pressure is 126/70, pulse 70 and regular, respirations 18.  GENERAL APPEARANCE:  That of a pleasant white gentleman in no distress on IV  heparin.  HEENT:  Exam is normocephalic.  Full EOMs.  Pharynx clear.  Dentition  adequate.  NECK:  Without JVD, mass or carotid bruits.  LYMPHATICS:  Reveal no palpable supraclavicular or axillary adenopathy.  His breath sounds are clear and equal and there is no thoracic deformity.  CARDIAC:  Exam reveals a regular rhythm, without S3, gallop or murmur or  rub.  ABDOMEN:  Exam is soft, mildly obese, nontender without pulsatile mass or  organomegaly.  EXTREMITIES:  Reveal no deformity, edema or cyanosis.  Peripheral pulses are  2+ in all extremities and there is no venous insufficiency of the lower  extremities.  SKIN:  Without rash or lesion.  NEUROLOGIC:  Exam is alert and oriented without focal motor deficit.  He  does have a resting tremor.   LABORATORY DATA:  I reviewed the coronary arteriograms and his chest x-ray  and laboratory values.  He has severe three-vessel coronary artery disease  with 95% stenosis of the right coronary, 90% stenosis of the LAD and 80-90%  stenosis of the circumflex marginals.  His creatinine is 1.2 with BUN 16,  sodium 142.  His glucose is 127 with a hemoglobin A1c of 7.1.  His total  cholesterol is 235 with a triglycerides of 197 and HDL of 41.   IMPRESSION AND PLAN:  The patient would benefit from surgical  revascularization of his multivessel coronary artery disease.  His surgery  will be scheduled for May 24.  I discussed the details of surgery with the  patient as well  as the alternatives to surgical revascularization and the associated risks. At this time, he is in agreement to proceed with surgery on May  24.   Thank you for the consultation.      Kerin Perna, M.D.  Electronically Signed     PV/MEDQ  D:  01/28/2006  T:  01/28/2006  Job:  [id number]   cc:   [facility]   CVTS Office

## 2011-01-25 NOTE — H&P (Signed)
NAME:  [name] [name], [name] [name] NO.:  [id number]   MEDICAL RECORD NO.:  [id number]          PATIENT TYPE:  AMB   LOCATION:  DAY                          FACILITY:  [facility]   PHYSICIAN:  Pricilla Riffle, M.D.    DATE OF BIRTH:  [birth year]   DATE OF ADMISSION:  09/18/2005  DATE OF DISCHARGE:  09/18/2005                                HISTORY & PHYSICAL   HISTORY:  The patient is an 77 year old gentleman (birthday today) was  recently seen by Maurine Cane, M.D. in the clinic.  He had a history of  exertional dyspnea and chest tightness for 2 months.   He has no known coronary artery disease.  He has a history of hypertension.   The patient was set up for stress Myoview scan.  He has this done today.  Baseline EKG was normal.  He walked on the treadmill for 4 minutes 37  seconds to a peak heart rate of only 100.  Peak blood pressure 145/79.  His  legs became very tired and he actually jumped off the treadmill.  It was at  this time that he began having chest pain that eased off over several minute  in the recovery period after nitroglycerin was given.   EKG at about 3 minutes showed subtle ST changes in the anterior leads.  This  went on to more pronounced depression at 3 minutes 50 seconds in V-2 through  V-6, 1, 3, and F with worsening to downsloping ST depression, 1 to 1.1 to 2  mm in 1, 2, 3, F V-2 through V-6.  By 12 minutes in recovery, these changes  were nearly resolved.   The patient currently is pain free.   ALLERGIES:  No known drug allergies.   MEDICATIONS:  1.  Allopurinol 300 daily.  2.  Inderal 80 daily.  3.  Lisinopril hydrochlorothiazide 20/12.5 daily.  4.  Primidone 25 daily.  5.  Avandia 4 daily.   PAST MEDICAL HISTORY:  1.  Gout.  2.  Hypertension.  3.  Diabetes.   SOCIAL HISTORY:  The patient is retired from the [employer].  He is  married.  He has 1 daughter and 3 grandchildren.   REVIEW OF SYSTEMS:  All systems reviewed, negative to above  problems except  as noted.   PHYSICAL EXAMINATION:  GENERAL:  The patient is in no acute distress.  VITAL SIGNS:  Blood pressure 150/79, pulse 63, weight not taken.  NECK:  JVP is normal.  LUNGS:  Clear.  CARDIAC:  Regular rate and rhythm, S1 and S2, no S3 or S4 or significant  murmurs.  ABDOMEN:  Benign, obese.  EXTREMITIES:  Trace pulses.  No edema.   IMPRESSION:  Patient is a 77 year old gentleman with a history of chest  pain, dyspnea now presents for a stress Myoview scan, developed a concern  for claudication early that made him jump off of the treadmill.  He then  developed chest pain EKG changes that are very early on with only minimal  increase in heart rate to 100.  Most consistent with a borderline early  positive test.  Based on the above, I would recommend cardiac catheterization to find the  anatomy.  The patient has an IV in place and will begin intravenous  hydration.  Plan for transfer to [facility] for this.  The patient is  currently pain free.  We will check fasting lipid panel and we will also  check hemoglobin A1c.           ______________________________  Pricilla Riffle, M.D.     PVR/MEDQ  D:  01/24/2006  T:  01/24/2006  Job:  [id number]   cc:   Charlton Haws, M.D.  [address]  [address]  [place]  [place] [address]

## 2011-01-25 NOTE — Discharge Summary (Signed)
NAMEHAYDEN, [name] [name] NO.:  [id number]   MEDICAL RECORD NO.:  [id number]          PATIENT TYPE:  INP   LOCATION:  2023                         FACILITY:  [facility]   PHYSICIAN:  Kerin Perna, M.D.  DATE OF BIRTH:  October 04, 1928   DATE OF ADMISSION:  01/24/2006  DATE OF DISCHARGE:                                 DISCHARGE SUMMARY   ADMITTING DIAGNOSES:  Chest pain.   PAST MEDICAL HISTORY AND DISCHARGE DIAGNOSES:  1.  Type 2 diabetes mellitus with a hemoglobin A1c of 7.1.  2.  Hypertension.  3.  Adenocarcinoma of the prostate status post cryotherapy January 2007.  4.  Gout.  5.  History of glomerulonephritis 40 years ago with a persistent microscopic      hematuria, but normal BUN and creatinine.  6.  Benign tremor.  7.  Severe three vessel coronary artery disease with unstable angina status      post coronary artery bypass grafting x4.   ALLERGIES:  No known drug allergies.   BRIEF HISTORY:  The patient is a 77 year old male who presented with a  several week history of exertional dyspnea and chest tightness that was  associated with a decrease in exercise tolerance.  Patient underwent a  stress test using standard Bruce protocol at which time he developed a  significantly abnormal EKG and the study was terminated.  No Cardiolite  imaging was performed.  He was admitted to [facility] where cardiac enzymes  were checked and were found to be negative.  He was placed on heparin and  nitroglycerin and then underwent a diagnostic cardiac catheterization on Jan 27, 2006 which demonstrated severe three vessel coronary artery disease.  CVTS service was then consulted regarding surgical revascularization.   HOSPITAL COURSE:  The patient was admitted after a significantly positive  stress test as previously stated.  The patient was maintained on routine  hospital care and then taken for diagnostic cardiac catheterization on Jan 27, 2006 which revealed severe three  vessel coronary artery disease.  His  ejection fraction was noted to be 60%.  The patient then underwent a CT of  the abdomen secondary to a potential abdominal aortic aneurysm seen on  aortogram at the time of catheterization.  CT scan, however, was negative  other than some renal cystic disease.  Dr. Donata Clay of the CVTS service was  then consulted regarding surgical revascularization.  Dr. Donata Clay  evaluated patient on Jan 28, 2006.  It was his opinion that the patient  should proceed with coronary artery bypass grafting.   Patient was taken to the OR on Jan 30, 2006 for coronary artery bypass  grafting x4.  Angioscopic vessel harvesting was performed on the right  thigh.  Saphenous vein was grafted to OM1 in sequence to OM2, saphenous vein  was grafted to the distal right and the left internal mammary artery was  grafted to the LAD.  Patient tolerated the procedure well and was  hemodynamically stable immediately postoperatively.  Patient was transferred  from the OR to the SICU in stable condition.  Patient was extubated without  complication and woke up from anesthesia neurologically intact.   Patient's postoperative course has progressed as expected.  His diabetes  mellitus has been controlled throughout the postoperative course with  sliding scale insulin, Lantus insulin, as well as resumption of his Avandia.  The patient has been volume overloaded postoperatively and has been diuresed  accordingly and will require further diuresis as an outpatient.  On  postoperative day one he was afebrile with stable vital signs and  maintaining a normal sinus rhythm.  All invasive lines and chest tubes were  discontinued in a routine manner and he was ambulated.  The patient has  participated in cardiac rehabilitation since postoperative day one and has  continued to progress to a satisfactory level at this time.   On postoperative day three the patient is without complaint.  He is  afebrile  with stable vital signs and maintaining a normal sinus rhythm.  On physical  examination his wounds are clean and dry.  There is trace edema present in  the bilateral lower extremities and the lungs are clear.  Cardiac is regular  rate and rhythm.  The patient is in stable condition at this time and as  long as he continues to progress in the current manner should be ready for  discharge in the next one to two days pending morning round reevaluation.   LABORATORIES:  CBC and BMP on Feb 02, 2006:  White count 8.5, hemoglobin  8.7, hematocrit 26, platelets 145.  Sodium 136, potassium 3.8, BUN 19,  creatinine 1.4, glucose 75.   CONDITION ON DISCHARGE:  Improved.   INSTRUCTIONS:   MEDICATIONS:  1.  Aspirin 325 mg daily.  2.  Toprol XL 25 mg daily.  3.  Lisinopril/HCTZ 20/12.5 mg daily.  4.  Allopurinol 300 mg daily.  5.  Mysoline 25 mg daily.  6.  Avandia 4 mg daily.  7.  Lasix 40 mg daily x5 days.  8.  K-Dur 20 mEq daily x5 days.  9.  Tylox one to two q.4-6h. p.r.n. pain.   ACTIVITY:  No driving or lifting more than 10 pounds x3 weeks and the  patient should continue daily breathing and walking exercises.   DIET:  Low salt, low fat, and carbohydrate modified mean calorie.   WOUND CARE:  The patient may shower daily and clean the incisions with soap  and water.  If wound problems arise patient should contact CVTS office.   FOLLOW-UP:  Appointment with Dr. Eden Emms.  Patient will be instructed to  contact his office for an appointment two weeks after discharge at which  time a PA and lateral chest x-ray will be taken.  Dr. Donata Clay three weeks  after discharge.  CVTS office will contact patient with the date and time of  that appointment.      Pecola Leisure, [place]      Kerin Perna, M.D.  Electronically Signed    AY/MEDQ  D:  02/02/2006  T:  02/03/2006  Job:  [id number]   cc:   Charlton Haws, M.D.  [address]  [address] [place]  [place] [address]

## 2011-01-25 NOTE — Op Note (Signed)
NAME:  [name] [name], [name] [name]            ACCOUNT NO.:  [id number]   MEDICAL RECORD NO.:  [id number]          PATIENT TYPE:  AMB   LOCATION:  DAY                          FACILITY:  [facility]   PHYSICIAN:  Sigmund I. Patsi Sears, M.D.DATE OF BIRTH:  1928/10/04   DATE OF PROCEDURE:  09/18/2005  DATE OF DISCHARGE:                                 OPERATIVE REPORT   PREOPERATIVE DIAGNOSIS:  Adenocarcinoma of the prostate.   POSTOPERATIVE DIAGNOSIS:  Adenocarcinoma of the prostate.   OPERATIONS:  Cryotherapy of the prostate.   SURGEON:  Sigmund I. Patsi Sears, M.D.   ANESTHESIA:  General LMA.   PREPARATION:  After appropriate preanesthesia, the patient was brought to  the operating room, placed on the operating table in dorsal supine position  where general LMA anesthesia was introduced. He was then replaced in the  dorsal lithotomy position where the pubis was prepped with Betadine solution  and draped in the usual fashion.   Extra care was given to suturing the scrotum out of the operative field, and  a green towel was also placed to protect the scrotum and the testicle.   REVIEW OF HISTORY:  This 77 year old married white male was found to have a  prostate nodule, and a PSA of 4.1 by Dr. Hetty Ely. Biopsy showed Gleason 3+3  (6) adenocarcinoma of the prostate on the left side, with PIN on the right  side. Prostate size was originally 78 mL with negative CT and bone scan.  After a second opinion consultation with radiation therapy, and also  consideration of radical prostatectomy with the laparoscopic approach, the  patient has elected cryotherapy as therapy for his prostate. He underwent  hormone downsizing, to a size of 44 mL  preoperatively, and 35 mL on the day  of surgery. He is now for cryosurgery. His past history is significant for  erectile dysfunction, type 2 diabetes, hypertension, gout, glomerular  nephritis.   DESCRIPTION OF PROCEDURE:  The prostate was divided into 3 areas, and  3 rows  of needles were placed with care taken to avoid any injury to the urethra.  Needles were placed under horizontal and lateral ultrasonic control.   Following placement of needles, the thermosensor devices were placed both  around the urethra, and in Denonvilliers fascia.   The patient underwent two separate freeze - thaw cycles, with direct  visualization of the ice ball prostatic affect. Care was taken to avoid any  thermosensor low temperatures, and the lowest thermocouple sensor  temperature was -7 degrees. This was only for several seconds.   Following the second freeze - thaw cycle when warming, the needles were  removed. Minimal bleeding was noted. The urethral warming device was left in  place for 20 minutes. A sterile dressing was applied. The patient was  awakened, and taken to recovery room in excellent condition. It is noted  that he underwent suprapubic tube insertion at the beginning of the case.  This will be left in position, until the patient is able to void normally.      Sigmund I. Patsi Sears, M.D.  Electronically Signed     SIT/MEDQ  D:  09/18/2005  T:  09/19/2005  Job:  [id number]   cc:   Laurita Quint, M.D.  Fax: [phone]

## 2011-01-25 NOTE — Cardiovascular Report (Signed)
NAME:  CESAR, ROGERSON NO.:  [id number]   MEDICAL RECORD NO.:  [id number]          PATIENT TYPE:  INP   LOCATION:  3702                         FACILITY:  [facility]   PHYSICIAN:  Salvadore Farber, M.D. LHCDATE OF BIRTH:  [birth year]   DATE OF PROCEDURE:  01/27/2006  DATE OF DISCHARGE:                              CARDIAC CATHETERIZATION   PROCEDURE:  Left heart catheterization, left ventriculography, abdominal  aortography, coronary angiography.   INDICATIONS:  Mr. Montez Morita is a 77 year old gentleman who presents with  several weeks of exertional dyspnea accompanied by chest discomfort.  On  Friday, he underwent an exercise tolerance test.  He stopped the test at  approximately 3 minutes of the standard Bruce protocol due to chest  discomfort and fatigue.  He achieved a peak heart rate of only 100 beats per  minute.  Despite this low work load, he had a markedly positive  electrocardiographic portion of the test.  No Cardiolite imaging was  performed.  He was admitted to the hospital and referred for diagnostic  angiography.   PROCEDURAL TECHNIQUE:  Informed consent was obtained.  Underwent 1%  lidocaine local anesthesia, a 5-French sheath was placed in the right common  femoral artery using the modified Seldinger technique.  Coronary angiography  was performed using JL-4 and JR-4 catheters.  Left heart catheterization and  ventriculography was performed using a pigtail catheter.  The pigtail  catheter was then pulled back to the suprarenal abdominal aorta.  Abdominal  aortography was performed by power injection.  The patient tolerated the  procedure well and was transferred to the holding room in stable condition.  Sheaths will be removed there.   COMPLICATIONS:  None.   FINDINGS:  1.  LV:  151/8/13.  EF 65% without regional wall motion abnormality.  2.  No aortic stenosis or mitral regurgitation.  3.  Abdominal aorta:  There is mild plaque without significant  stenosis.      There is a possible small aneurysm of the infrarenal abdominal aorta.  4.  Renal arteries:  There are two vessels bilaterally.  Both vessels on the      right appear minimally diseased.  The left upper vessel is small and may      have an at least moderate ostial stenosis.  The ostium of it is not      clearly seen.  The left lower appears minimally diseased and supplies      the bulk of the left kidney.  5.  Left main:  Angiographically normal.  6.  LAD:  The vessel is a moderate-sized vessel which supplies four small      diagonal branches.  The proximal vessel has a 90% stenosis focally.      There is then an 80% stenosis just before the takeoff of the second      diagonal.  The distal vessel is at least moderately diseased diffusely      at the apex.  7.  Circumflex:  Large vessel giving rise to 2 obtuse marginals.  The first      marginal has an 80% stenosis proximally.  The AV groove circumflex is      occluded after the takeoff of the first marginal.  The second marginal      is collateralized from both the left in the right.  8.  RCA:  Moderate-sized dominant vessel.  There is a 95% stenosis      proximally.   IMPRESSION AND PLAN:  The patient has multivessel coronary disease with  preserved left ventricular systolic function.  Due to the diffuse nature of  the disease, will refer to CVTS for consideration of coronary artery bypass  grafting surgery.  Will check an abdominal ultrasound to size what appears  to be a small abdominal aortic aneurysm.      Salvadore Farber, M.D. [facility]  Electronically Signed     WED/MEDQ  D:  01/27/2006  T:  01/27/2006  Job:  [id number]   cc:   Charlton Haws, M.D.  [address]  [address]  [place]  [place] [address]

## 2011-01-25 NOTE — Assessment & Plan Note (Signed)
[facility]                              CARDIOLOGY OFFICE NOTE   NAME:Biondolillo, HOBY KAWAI                   MRN:          [id number]  DATE:03/27/2006                            DOB:          11-Jul-1929    REASON FOR VISIT:  Mr. Phillips returns today for followup.  He is status  post recent semi-emergent CABG with good LV function.  Initially  postoperatively, he was a little bit overmedicated and dehydrated.  We  adjusted his medications.   He is currently doing better.  He is a diabetic; however, I told him I would  like get him on a low dose of ACE inhibitor for its renal protection and  decreased proteinuria.  He also being a diabetic, I think it would be better  to switch him to Coreg in regards to decreased insulin resistance.  I asked  him to see Dr. Hetty Ely in regarding to stopping his Avandia since he is a  coronary patient.   PHYSICAL EXAMINATION:  GENERAL:  He looks well.  VITAL SIGNS:  Blood pressure 136/78, pulse 68 and regular.  LUNGS:  Clear.  CARDIAC:  Normal S1 and S2 with normal heart sounds.  ABDOMEN:  Benign.  EXTREMITIES:  No edema.   IMPRESSION:  Stable, status post coronary artery bypass grafting and  reasonable risk factor modifications.  Normal liver function tests on  Crestor.  Blood pressure improved and no longer postural.  Try low-dose  lisinopril and Coreg CR.  Follow up with Hetty Ely to change oral  hyperglycemic.  I will see him back in two to three weeks.                               Noralyn Pick. Eden Emms, MD, [facility]    PCN/MedQ  DD:  03/27/2006  DT:  03/27/2006  Job #:  [id number]

## 2011-01-25 NOTE — Assessment & Plan Note (Signed)
[facility] HEALTHCARE                            CARDIOLOGY OFFICE NOTE   NAME:[name], [name] LEACH                   MRN:          [id number]  DATE:11/06/2006                            DOB:          1929-07-31    [name] [name] returns today for follow up.   He is status post CABG.  His last Myoview in May of 2007 was low risk  with apical thinning.  He had normal LV function.   Jarnell has been doing well.  He has not had any significant chest pain,  PND or orthopnea.  He continues to golf on a regular basis.   His blood pressure continues to be a bit high.  We have checked it now  over the last 6 months and I believe his lisinopril should be increased  to 20 mg a day.   He is due to have a physical with Dr. Hetty Ely in April and I asked him  to have his liver and lipids checked at that time.  He has not had any  significant chest pain.   He will also need his hemoglobin A1c checked with Dr. Hetty Ely.   In talking to [place], he could be a little bit stricter with his diet.  He still continues to have some salt in it.  I explained to him the  relationship between this and his blood pressure.   We also talked a little bit about his Coreg CR.  He was wondering if he  could save money on this.  I told him the only way that we could was to  switch him to a generic b.i.d. Coreg and he would rather take the single  pill.   REVIEW OF SYSTEMS:  Remarkable for a recent tooth abscess which he is on  amoxicillin for.   MEDICATIONS:  1. Primidone 25 mg a day.  2. Avandia 4 mg a day.  3. Crestor 10 mg a day.  4. An aspirin a day.  5. Lisinopril 10 a day, to be increased to 20 a day.  6. Coreg CR 20 a day.   EXAM:  Blood pressure is 155/86, weight is stable at 199, pulse 71 and  regular.  HEENT:  Normal.  LUNGS:  Clear.  He has an S1, S2 with a soft systolic murmur.  ABDOMEN:  Benign.  LOWER EXTREMITIES:  Intact pulses, no edema.  There are no renal bruits.  NEURO:  Nonfocal.  SKIN:  Warm and dry.   IMPRESSION:  Stable status post coronary artery bypass graft, low risk  Myoview in 2007.  Continue current therapy.  Risk factor modification  adequate.  Follow up liver and lipid profile, on Crestor, in April with  Dr. Hetty Ely, when his hemoglobin A1c will also be checked.   He will continue an aspirin a day for graft patency.   Blood pressure not particularly well controlled.  Lisinopril to be  increased from 10 to 20 mg a day.   He is status post CABG and we will maintain him on his Coreg since he is  a diabetic and the Coreg has less insulin resistance.  Overall, he is active and not having angina and I am pleased with his  progress.  His scripts were re-written for today and I will see him back  in 6 months to recheck his blood pressure.     Noralyn Pick. Eden Emms, MD, [facility]  Electronically Signed    PCN/MedQ  DD: 11/06/2006  DT: 11/06/2006  Job #: [id number]

## 2011-01-25 NOTE — Assessment & Plan Note (Signed)
[facility] HEALTHCARE                              CARDIOLOGY OFFICE NOTE   NAME:[name] [name], [name] [name]                   MRN:          [id number]  DATE:05/01/2006                            DOB:          1928/09/20    [name] returns today for follow-up.  He is status post CABG.  He has  been doing fairly well.  He would like to swing a golf club again, and I  told him this would be fine since it has been about 3 months.  His blood  pressure has been somewhat labile but seems to be improved on lisinopril 10  mg a day, and I told him if his blood pressure runs over 150 systolic he  could increase his lisinopril to 20 mg a day.  I also mentioned to him that  this is a drug that could be had at [facility] for $4.  He will continue his  Coreg CR at 20 mg a day.   He tells me his blood sugars have been under reasonable control.  He has not  had any significant chest pain, PND or orthopnea.   PHYSICAL EXAMINATION:  GENERAL:  His blood pressure is 150/83, pulse 69 and  regular.  LUNGS:  Clear.  CARDIAC:  Normal S1, S2, normal heart sounds.  CHEST:  Sternum is well-healed.  ABDOMEN:  Benign.  LOWER EXTREMITIES:  Intact pulses, no edema.   MEDICATIONS:  1. Crestor 10 mg a day.  2. Lisinopril 10 mg a day.  3. Coreg 20 mg a day.  4. He is also on some diabetes and gout medicine.   IMPRESSION:  Stable status post bypass.  Good left ventricular function and  good risk factor modification although somewhat labile blood pressure.  We  will see him back in 6 months.  Overall he is doing fairly well.                               Noralyn Pick. Eden Emms, MD, [facility]    PCN/MedQ  DD:  05/01/2006  DT:  05/01/2006  Job #:  [id number]

## 2011-01-25 NOTE — Op Note (Signed)
NAMEHASEEB, FIALLOS NO.:  [id number]   MEDICAL RECORD NO.:  [id number]          PATIENT TYPE:  INP   LOCATION:  2303                         FACILITY:  [facility]   PHYSICIAN:  Kerin Perna, M.D.  DATE OF BIRTH:  1929-03-25   DATE OF PROCEDURE:  01/30/2006  DATE OF DISCHARGE:                                 OPERATIVE REPORT   OPERATION:  Coronary artery bypass grafting x4 (left internal mammary artery  to the LAD, saphenous vein graft to right coronary artery, sequential  saphenous vein graft to OM1 and OM2).   PREOPERATIVE DIAGNOSIS:  Class IV unstable angina, with severe three-vessel  coronary artery disease.   POSTOPERATIVE DIAGNOSIS:  Class IV unstable angina, with severe three-vessel  coronary artery disease.   SURGEON:  Kerin Perna, M.D.   ASSISTANT:  Angelina Sheriff. Wortman, P.A.-C.   ANESTHESIA:  General by Dr. Laverle Hobby.   INDICATIONS:  The patient is a 77 year old male diabetic who presented with  dyspnea on exertion, and a stress test showed a strongly early ischemic  changes.  He was admitted to hospital and underwent cardiac catheterization  which demonstrated high-grade three-vessel coronary artery disease with 95%  stenosis of the proximal right coronary, 90% stenosis of the LAD, and 95%  stenosis of the circumflex.  His LV systolic function was preserved.  He was  felt to be a candidate for surgical revascularization.   Prior to surgery, I examined the patient and his hospital room on at least  two separate occasions and reviewed results of the cardiac catheterization  with the patient as well as with the patient's family.  I discussed the  indications and expected benefits of coronary artery bypass surgery for  treatment of his coronary artery disease.  I reviewed the alternatives to  surgical therapy as well.  I discussed with the patient the major aspects of  the planned procedure including the choice of conduit to include internal  mammary artery and endoscopically harvested saphenous vein, the location and  size of the surgical incisions, the use of general anesthesia and  cardiopulmonary bypass, and the expected postoperative hospital recovery.  I  reviewed with the patient the risks to him of coronary artery bypass  surgery, including risks of MI, CVA, bleeding, blood transfusion  requirement, infection, and death.  He understood these factors regarding  bypass surgery and agreed to proceed with operation as planned under what I  felt was an informed consent.   OPERATIVE FINDINGS:  The patient was given Amicar for this operation and was  not given aprotinin.  The patient did not require any blood products.  The  patient was maintained on an intravenous insulin infusion according to the  [employer] protocol.  The heart was covered with thick layer of epicardial  fat.  The LAD was deeply imbedded in the fat.  It was a suboptimal target.  The distal circumflex vessel was small but graftable.  The vein was  harvested endoscopically from the left leg.  The right leg vein was exposed  but was not harvested.   PROCEDURE:  The patient was brought  to the operating room and placed supine  on the operating room table, where general anesthesia was induced under  invasive hemodynamic monitoring.  The chest, abdomen, and legs were prepped  with Betadine and draped as a sterile field.  A sternal incision was made as  the saphenous vein was harvested endoscopically from the left thigh.  The  left internal mammary artery was harvested as a pedicle graft from its  origin at the subclavian vessels.  It was a small 1.5 mm vessel but had  excellent flow.  The sternal retractor was placed, and the pericardium was  opened and suspended.  Pursestrings were placed in the ascending aorta and  right atrium.  The patient was given heparin, and the ACT was documented as  being therapeutic.  The patient was cannulated and placed on bypass.   The  coronaries were identified for grafting.  The mammary artery and vein grafts  were prepared for the distal anastomoses.  Cardioplegia catheters were  placed for both antegrade aortic and retrograde coronary sinus cardioplegia.  The patient was cooled to 32 degrees.  The aortic cross-clamp was applied.  800 cc of cold blood cardioplegia was delivered in split doses between the  antegrade aortic and retrograde coronary sinus catheters.  There was good  cardioplegic arrest, and septal temperature dropped less than 14 degrees.  Topical iced saline was used to augment myocardial preservation.   The distal coronary anastomoses were then performed.  The first distal  anastomosis was to the mid to distal right coronary.  This was a 1.75 mm  vessel with a proximal 95% stenosis.  A reverse saphenous vein was sewn end-  to-side with running 7-0 Prolene.  There was good flow through graft.  The  second distal anastomosis was part of the sequential vein graft to the OM1  and OM2.  The OM1 was a 1.7 mm vessel which was intramyocardial and had a  proximal 80% stenosis.  A reverse saphenous vein was used to construct a  side-to-side anastomosis with the OM1.  There was excellent flow through  graft.  The third distal anastomosis was the continuation of the sequential  vein graft to the OM2.  This was a smaller 1.5 mm vessel which was totally  occluded proximally.  The end of the vein was sewn end-to-side with running  7-0 Prolene.  There was good flow through graft.  Cardioplegia was redosed.  The fourth distal anastomosis was to the midportion of the LAD.  The LAD had  a proximal 90% stenosis.  The IMA pedicle was brought through an opening  created in the pericardium through a thick layer of fat down onto the LAD.  An end-to-side anastomosis with running 8-0 Prolene was constructed.  There was excellent flow through the anastomosis after briefly releasing the  pedicle bulldog on the mammary  pedicle.  The bulldog was replaced and the  pedicle secured to the epicardium.  Cardioplegia was redosed.   While the cross-clamp was still in place, two proximal vein anastomoses were  performed on the ascending aorta using a 4.0 mm punch and a running 6-0  Prolene.  Prior to tying down the final proximal anastomosis, air was vented  from the coronaries and the left side of the heart was a with a dose of  retrograde warm blood cardioplegia.  The final proximal anastomosis was tied  down, and the cross-clamp was removed.   The heart was cardioverted back to a regular rhythm.  A 27-gauge  needle was  used aspirated from the vein grafts.  The vein grafts were both opened.  Each had excellent flow.  Hemostasis was documented at the proximal and  distal anastomoses.  The patient was rewarmed and reperfused.  Temporary  pacing wires were applied.  The lungs were expanded, and the ventilator was  resumed.  When the patient reached 37.5 degrees, he was weaned from bypass  without difficulty.  The patient was on renal dose dopamine and in a sinus  rhythm.  Protamine was administered without adverse reaction.  The  hemodynamics remained stable.  The cannulas were removed.  The mediastinum  was irrigated with a warm antibiotic irrigation.  The leg incision was  irrigated and closed in a standard fashion.  The superior pericardial fat  was closed over the aorta.  Two mediastinal and a left pleural chest tube  were placed and  brought out through separate incisions.  The sternum was closed with  interrupted steel wire.  The pectoralis fascia was closed with a running #1  Vicryl.  The subcutaneous and skin layers were closed with a running Vicryl,  and sterile dressings were applied.  Total bypass time was 134 minutes, with  cross-clamp time of 84 minutes.      Kerin Perna, M.D.  Electronically Signed     PV/MEDQ  D:  01/30/2006  T:  01/31/2006  Job:  [id number]   cc:   CV[facility] Office   Charlton Haws, M.D.  [address]  [address]  [place]  [place] [address]

## 2011-09-20 ENCOUNTER — Encounter: Payer: Self-pay | Admitting: *Deleted

## 2011-09-20 ENCOUNTER — Encounter: Payer: Self-pay | Admitting: Cardiovascular Disease

## 2011-09-20 ENCOUNTER — Ambulatory Visit (INDEPENDENT_AMBULATORY_CARE_PROVIDER_SITE_OTHER): Payer: Medicare Other | Admitting: Cardiovascular Disease

## 2011-09-20 VITALS — BP 168/76 | HR 65 | Ht 71.0 in | Wt 197.0 lb

## 2011-09-20 DIAGNOSIS — I10 Essential (primary) hypertension: Secondary | ICD-10-CM

## 2011-09-20 DIAGNOSIS — E785 Hyperlipidemia, unspecified: Secondary | ICD-10-CM

## 2011-09-20 DIAGNOSIS — I2510 Atherosclerotic heart disease of native coronary artery without angina pectoris: Secondary | ICD-10-CM

## 2011-09-20 DIAGNOSIS — E119 Type 2 diabetes mellitus without complications: Secondary | ICD-10-CM | POA: Diagnosis not present

## 2011-09-20 DIAGNOSIS — I452 Bifascicular block: Secondary | ICD-10-CM

## 2011-09-20 MED ORDER — TRAMADOL HCL 50 MG PO TABS: 50.0000 mg | ORAL_TABLET | Freq: Three times a day (TID) | ORAL | Status: DC | PRN

## 2011-09-20 MED ORDER — LISINOPRIL 20 MG PO TABS: 20.0000 mg | ORAL_TABLET | Freq: Every day | ORAL | Status: DC

## 2011-09-20 MED ORDER — ROSUVASTATIN CALCIUM 10 MG PO TABS: 10.0000 mg | ORAL_TABLET | Freq: Every day | ORAL | Status: DC

## 2011-09-20 MED ORDER — CARVEDILOL PHOSPHATE ER 20 MG PO CP24: 20.0000 mg | ORAL_CAPSULE | Freq: Every day | ORAL | Status: DC

## 2011-09-20 NOTE — Assessment & Plan Note (Signed)
No change from ECG reviewed from 2006  Yearly echo  No evidence of syncope or high grade heart block

## 2011-09-20 NOTE — Progress Notes (Signed)
Patient ID: [name] [name], male   DOB: July 21, 1929, 78 y.o.   MRN: [id number] 78 yo referred by Dr Para March.  Has not been seen here in over 4 years when he moved to [place].  Had CABG in 2006 by Dr Maren Beach.  Last myovue in 2007.  No recent problems with SSCP, dyspnea or palpitations.  Compliant with meds.  Recent diagnosis of DM.  Has increasing lower back pain that limits mobility.  CRF;s otherwise HTN and elevated lipids.  Has BP cuff at home but not sure it is accurate.  Had been running around 150 systolic Compliant with meds.  Limited exercise.    ROS: Denies fever, malais, weight loss, blurry vision, decreased visual acuity, cough, sputum, SOB, hemoptysis, pleuritic pain, palpitaitons, heartburn, abdominal pain, melena, lower extremity edema, claudication, or rash.  All other systems reviewed and negative   General: Affect appropriate Healthy:  appears stated age HEENT: normal Neck supple with no adenopathy JVP normal no bruits no thyromegaly Lungs clear with no wheezing and good diaphragmatic motion Heart:  S1/S2 no murmur,rub, gallop or click PMI normal Abdomen: benighn, BS positve, no tenderness, no AAA no bruit.  No HSM or HJR Distal pulses intact with no bruits No edema Neuro non-focal Skin warm and dry No muscular weakness  Medications Current Outpatient Prescriptions  Medication Sig Dispense Refill  . allopurinol (ZYLOPRIM) 300 MG tablet Take 300 mg by mouth daily.      . carvedilol (COREG CR) 20 MG 24 hr capsule Take 20 mg by mouth daily.      Marland Kitchen ibuprofen (ADVIL,MOTRIN) 200 MG tablet Take 600 mg by mouth as needed.      Marland Kitchen lisinopril (PRINIVIL,ZESTRIL) 20 MG tablet Take 20 mg by mouth daily.      . pioglitazone (ACTOS) 15 MG tablet Take 15 mg by mouth daily.      . primidone (MYSOLINE) 250 MG tablet Take 250 mg by mouth daily.      . rosuvastatin (CRESTOR) 10 MG tablet Take 10 mg by mouth daily.      . traMADol (ULTRAM) 50 MG tablet Take 1 tablet (50 mg total) by  mouth every 8 (eight) hours as needed for pain.  30 tablet  1    Allergies Sulfonamide derivatives  Family History: Family History  Problem Relation Age of Onset  . Heart disease Mother   . Hypothyroidism Brother   . Cancer Mother   . Diabetes Sister   . Diabetes Sister   . Prostate cancer Brother     Social History: History   Social History  . Marital Status: Married    Spouse Name: N/A    Number of Children: N/A  . Years of Education: N/A   Occupational History  . Not on file.   Social History Main Topics  . Smoking status: Never Smoker   . Smokeless tobacco: Never Used  . Alcohol Use: Not on file  . Drug Use: Not on file  . Sexually Active: Not on file   Other Topics Concern  . Not on file   Social History Narrative  . No narrative on file    Electrocardiogram:  SR rate 65 RBBB LAD  Assessment and Plan

## 2011-09-20 NOTE — Assessment & Plan Note (Signed)
Discussed low carb diet.  Target hemoglobin A1c is 6.5 or less.  Continue current medications.  

## 2011-09-20 NOTE — Patient Instructions (Signed)
Your physician wants you to follow-up in: 6 months.  You will receive a reminder letter in the mail two months in advance. If you don't receive a letter, please call our office to schedule the follow-up appointment.  Your physician has recommended you make the following change in your medication: Start Tramadol 50mg  by mouth every 8 hours as needed for pain.

## 2011-09-20 NOTE — Assessment & Plan Note (Signed)
Will monitor at home may need higher dose of ACE  Low sodium diet

## 2011-09-20 NOTE — Assessment & Plan Note (Signed)
Stable with no angina and good activity level.  Continue medical Rx  

## 2011-09-20 NOTE — Assessment & Plan Note (Signed)
Cholesterol is at goal.  Continue current dose of statin and diet Rx.  No myalgias or side effects.  F/U  LFT's in 6 months. Lab Results  Component Value Date   LDLCALC 88 03/23/2010

## 2011-10-09 ENCOUNTER — Other Ambulatory Visit: Payer: Self-pay | Admitting: Family Medicine

## 2011-10-09 DIAGNOSIS — E119 Type 2 diabetes mellitus without complications: Secondary | ICD-10-CM

## 2011-10-09 DIAGNOSIS — Z8546 Personal history of malignant neoplasm of prostate: Secondary | ICD-10-CM

## 2011-10-15 ENCOUNTER — Other Ambulatory Visit (INDEPENDENT_AMBULATORY_CARE_PROVIDER_SITE_OTHER): Payer: Medicare Other

## 2011-10-15 DIAGNOSIS — E119 Type 2 diabetes mellitus without complications: Secondary | ICD-10-CM

## 2011-10-15 DIAGNOSIS — Z8546 Personal history of malignant neoplasm of prostate: Secondary | ICD-10-CM | POA: Diagnosis not present

## 2011-10-15 DIAGNOSIS — M109 Gout, unspecified: Secondary | ICD-10-CM | POA: Diagnosis not present

## 2011-10-15 LAB — COMPREHENSIVE METABOLIC PANEL
AST: 19 U/L (ref 0–37)
Alkaline Phosphatase: 60 U/L (ref 39–117)
BUN: 23 mg/dL (ref 6–23)
Glucose, Bld: 104 mg/dL — ABNORMAL HIGH (ref 70–99)
Potassium: 4.5 mEq/L (ref 3.5–5.1)
Sodium: 138 mEq/L (ref 135–145)
Total Bilirubin: 1 mg/dL (ref 0.3–1.2)

## 2011-10-15 LAB — LIPID PANEL
Cholesterol: 176 mg/dL (ref 0–200)
HDL: 53.4 mg/dL (ref >39.00)
LDL Cholesterol: 92 mg/dL (ref 0–99)
VLDL: 30.4 mg/dL (ref 0.0–40.0)

## 2011-10-15 LAB — TSH: TSH: 6.19 u[IU]/mL — ABNORMAL HIGH (ref 0.35–5.50)

## 2011-10-15 LAB — URIC ACID: Uric Acid, Serum: 5.3 mg/dL (ref 4.0–7.8)

## 2011-10-15 LAB — HEMOGLOBIN A1C: Hgb A1c MFr Bld: 6.1 % (ref 4.6–6.5)

## 2011-10-17 ENCOUNTER — Telehealth: Payer: Self-pay | Admitting: *Deleted

## 2011-10-17 ENCOUNTER — Encounter: Payer: Self-pay | Admitting: Family Medicine

## 2011-10-17 LAB — PSA, MEDICARE: PSA: 2.51 ng/mL (ref <=4.00)

## 2011-10-17 NOTE — Telephone Encounter (Signed)
Patient returned your call.  He is coming in for CPE tomorrow.

## 2011-10-18 ENCOUNTER — Ambulatory Visit (INDEPENDENT_AMBULATORY_CARE_PROVIDER_SITE_OTHER): Payer: Medicare Other | Admitting: Family Medicine

## 2011-10-18 ENCOUNTER — Encounter: Payer: Self-pay | Admitting: Family Medicine

## 2011-10-18 DIAGNOSIS — C61 Malignant neoplasm of prostate: Secondary | ICD-10-CM | POA: Diagnosis not present

## 2011-10-18 DIAGNOSIS — E785 Hyperlipidemia, unspecified: Secondary | ICD-10-CM | POA: Diagnosis not present

## 2011-10-18 DIAGNOSIS — Z1211 Encounter for screening for malignant neoplasm of colon: Secondary | ICD-10-CM

## 2011-10-18 DIAGNOSIS — M549 Dorsalgia, unspecified: Secondary | ICD-10-CM | POA: Diagnosis not present

## 2011-10-18 DIAGNOSIS — E119 Type 2 diabetes mellitus without complications: Secondary | ICD-10-CM | POA: Diagnosis not present

## 2011-10-18 DIAGNOSIS — M109 Gout, unspecified: Secondary | ICD-10-CM

## 2011-10-18 DIAGNOSIS — I10 Essential (primary) hypertension: Secondary | ICD-10-CM

## 2011-10-18 DIAGNOSIS — G250 Essential tremor: Secondary | ICD-10-CM

## 2011-10-18 DIAGNOSIS — Z8546 Personal history of malignant neoplasm of prostate: Secondary | ICD-10-CM

## 2011-10-18 DIAGNOSIS — Z87448 Personal history of other diseases of urinary system: Secondary | ICD-10-CM

## 2011-10-18 DIAGNOSIS — Z23 Encounter for immunization: Secondary | ICD-10-CM | POA: Diagnosis not present

## 2011-10-18 DIAGNOSIS — E039 Hypothyroidism, unspecified: Secondary | ICD-10-CM

## 2011-10-18 MED ORDER — ATORVASTATIN CALCIUM 40 MG PO TABS: 40.0000 mg | ORAL_TABLET | Freq: Every day | ORAL | Status: DC

## 2011-10-18 MED ORDER — HYDROCODONE-ACETAMINOPHEN 5-500 MG PO TABS: 0.5000 | ORAL_TABLET | Freq: Three times a day (TID) | ORAL | Status: AC | PRN

## 2011-10-18 MED ORDER — ALLOPURINOL 300 MG PO TABS: 300.0000 mg | ORAL_TABLET | Freq: Every day | ORAL | Status: DC

## 2011-10-18 MED ORDER — PIOGLITAZONE HCL 15 MG PO TABS: 15.0000 mg | ORAL_TABLET | Freq: Every day | ORAL | Status: DC

## 2011-10-18 MED ORDER — ESOMEPRAZOLE MAGNESIUM 40 MG PO CPDR: 40.0000 mg | DELAYED_RELEASE_CAPSULE | Freq: Every day | ORAL | Status: DC

## 2011-10-18 MED ORDER — PRIMIDONE 50 MG PO TABS: 25.0000 mg | ORAL_TABLET | Freq: Every day | ORAL | Status: DC

## 2011-10-18 NOTE — Telephone Encounter (Signed)
Will see pt today.  

## 2011-10-18 NOTE — Progress Notes (Signed)
Back pain.  Taking up to 12 ibuprofen a day w/o much relief.  Going on for years, getting worse. Was xrays in [place] and was told it was probably due to arthritis.  Pain with standing, in lower midline of back.  Gets better as the day goes on. No help with tramadol. Requesting records about back.  Pain with back extension.    H/o prostate cancer.  Prev seen by uro, last treatment was ~6 years ago. PSA stable.  H/o hematuria, longstanding and predates the prostate CA.   GERD controlled with PRN nexium.  No vomiting, no blood in stool.   Diabetes:  Using medications without difficulties:yes Hypoglycemic episodes:no Hyperglycemic episodes:no Feet problems:no Blood Sugars averaging: ~115 in AM eye exam within last year: <1 year  Hypertension:    Using medication without problems or lightheadedness: yes Chest pain with exertion:no Edema:no Short of breath:no Average home BPs: ~130s/80s at home  Elevated Cholesterol: Using medications without problems:yes Muscle aches: no Diet compliance: yes Exercise: limited by back  Gout, controlled with meds.  No flares.    Tremor is continued.  Sometimes it's worse than others.  No other new neuro sx.   PMH and SH reviewed.   Vital signs, Meds and allergies reviewed.  ROS: See HPI.  Otherwise nontributory.   GEN: nad, alert and oriented HEENT: mucous membranes moist NECK: supple w/o LA CV: rrr. PULM: ctab, no inc wob ABD: soft, +bs EXT: no edema SKIN: no acute rash Faint tremor noted in hands Prostate gland firm and smooth, no sig enlargement, no nodularity, tenderness, mass, asymmetry or induration.  Diabetic foot exam: Normal inspection No skin breakdown No calluses  Normal DP pulses Normal sensation to light tough and monofilament Nails normal

## 2011-10-18 NOTE — Patient Instructions (Addendum)
Check with your insurance to see if they will cover the shingles shot. See Shirlee Limerick about your referral before you leave today. Take the vicodin an needed for back pain.  It can make you drowsy and constipated.   Take care.  Recheck in 6 months.  A1c ahead of visit.

## 2011-10-20 ENCOUNTER — Encounter: Payer: Self-pay | Admitting: Family Medicine

## 2011-10-20 DIAGNOSIS — Z1211 Encounter for screening for malignant neoplasm of colon: Secondary | ICD-10-CM | POA: Insufficient documentation

## 2011-10-20 DIAGNOSIS — M549 Dorsalgia, unspecified: Secondary | ICD-10-CM | POA: Insufficient documentation

## 2011-10-20 NOTE — Assessment & Plan Note (Signed)
Will follow.

## 2011-10-20 NOTE — Assessment & Plan Note (Signed)
We talked about actos.  Med is tolerated (no h/o bladder CA) and benefit from control of DM2 outweighs other considerations, with repeat uro eval pending.  Pt agrees to continue actos.  A1c controlled, no change in meds.

## 2011-10-20 NOTE — Assessment & Plan Note (Signed)
D/w patient re:options for colon cancer screening, including IFOB vs. colonoscopy.  Risks and benefits of both were discussed and patient voiced understanding.  Pt elects for:IFOB  

## 2011-10-20 NOTE — Assessment & Plan Note (Signed)
Controlled on home checks, no change in meds.

## 2011-10-20 NOTE — Assessment & Plan Note (Signed)
Refer back to uro for re-eval, to re-est care (esp given his stated history of hematuria and current use of actos).  PSA isn't up significantly, but I'd like to have him seen again.

## 2011-10-20 NOTE — Assessment & Plan Note (Signed)
Refer back to uro.  

## 2011-10-20 NOTE — Assessment & Plan Note (Signed)
Controlled , no change in meds 

## 2011-10-20 NOTE — Assessment & Plan Note (Signed)
Change to atorvastatin. D/w pt, he agrees.  Insurance won't cover the crestor

## 2011-10-20 NOTE — Assessment & Plan Note (Addendum)
Now with midline pain in lower back, not with elevation in PSA.  Pain with facet loading on exam but not as much with forward flexion. No new neuro sx.  Will get outside records/images and then try to wean off nsaids. Refer to ortho for further consideration.  >50 min spent with face to face with patient, >50% counseling and/or coordinating care

## 2011-10-20 NOTE — Assessment & Plan Note (Signed)
Recheck yearly, no TMG on exam.

## 2011-10-25 ENCOUNTER — Other Ambulatory Visit: Payer: Medicare Other

## 2011-10-25 DIAGNOSIS — Z1289 Encounter for screening for malignant neoplasm of other sites: Secondary | ICD-10-CM

## 2011-10-25 LAB — FECAL OCCULT BLOOD, IMMUNOCHEMICAL: Fecal Occult Bld: NEGATIVE

## 2011-10-28 DIAGNOSIS — M545 Low back pain, unspecified: Secondary | ICD-10-CM | POA: Diagnosis not present

## 2011-10-28 DIAGNOSIS — M1990 Unspecified osteoarthritis, unspecified site: Secondary | ICD-10-CM | POA: Diagnosis not present

## 2011-10-28 DIAGNOSIS — IMO0002 Reserved for concepts with insufficient information to code with codable children: Secondary | ICD-10-CM | POA: Diagnosis not present

## 2011-11-02 ENCOUNTER — Ambulatory Visit: Payer: Self-pay | Admitting: Sports Medicine

## 2011-11-02 DIAGNOSIS — M5126 Other intervertebral disc displacement, lumbar region: Secondary | ICD-10-CM | POA: Diagnosis not present

## 2011-11-02 DIAGNOSIS — M5137 Other intervertebral disc degeneration, lumbosacral region: Secondary | ICD-10-CM | POA: Diagnosis not present

## 2011-11-02 DIAGNOSIS — M51379 Other intervertebral disc degeneration, lumbosacral region without mention of lumbar back pain or lower extremity pain: Secondary | ICD-10-CM | POA: Diagnosis not present

## 2011-11-02 DIAGNOSIS — M48061 Spinal stenosis, lumbar region without neurogenic claudication: Secondary | ICD-10-CM | POA: Diagnosis not present

## 2011-11-07 DIAGNOSIS — M4800 Spinal stenosis, site unspecified: Secondary | ICD-10-CM | POA: Diagnosis not present

## 2011-11-07 DIAGNOSIS — M5430 Sciatica, unspecified side: Secondary | ICD-10-CM | POA: Diagnosis not present

## 2011-11-07 DIAGNOSIS — M545 Low back pain, unspecified: Secondary | ICD-10-CM | POA: Diagnosis not present

## 2011-11-07 DIAGNOSIS — IMO0002 Reserved for concepts with insufficient information to code with codable children: Secondary | ICD-10-CM | POA: Diagnosis not present

## 2011-11-08 DIAGNOSIS — M47817 Spondylosis without myelopathy or radiculopathy, lumbosacral region: Secondary | ICD-10-CM | POA: Diagnosis not present

## 2011-11-08 DIAGNOSIS — M48062 Spinal stenosis, lumbar region with neurogenic claudication: Secondary | ICD-10-CM | POA: Diagnosis not present

## 2011-11-08 DIAGNOSIS — IMO0002 Reserved for concepts with insufficient information to code with codable children: Secondary | ICD-10-CM | POA: Diagnosis not present

## 2011-11-11 ENCOUNTER — Encounter: Payer: Self-pay | Admitting: Family Medicine

## 2011-11-11 DIAGNOSIS — C61 Malignant neoplasm of prostate: Secondary | ICD-10-CM | POA: Diagnosis not present

## 2011-11-14 ENCOUNTER — Encounter: Payer: Self-pay | Admitting: Family Medicine

## 2011-11-19 ENCOUNTER — Other Ambulatory Visit: Payer: Self-pay

## 2011-11-19 MED ORDER — LISINOPRIL 20 MG PO TABS: 20.0000 mg | ORAL_TABLET | Freq: Every day | ORAL | Status: DC

## 2011-11-29 DIAGNOSIS — IMO0002 Reserved for concepts with insufficient information to code with codable children: Secondary | ICD-10-CM | POA: Diagnosis not present

## 2011-11-29 DIAGNOSIS — M5430 Sciatica, unspecified side: Secondary | ICD-10-CM | POA: Diagnosis not present

## 2011-11-29 DIAGNOSIS — M545 Low back pain, unspecified: Secondary | ICD-10-CM | POA: Diagnosis not present

## 2011-11-29 DIAGNOSIS — M4800 Spinal stenosis, site unspecified: Secondary | ICD-10-CM | POA: Diagnosis not present

## 2011-12-11 DIAGNOSIS — M48062 Spinal stenosis, lumbar region with neurogenic claudication: Secondary | ICD-10-CM | POA: Diagnosis not present

## 2011-12-11 DIAGNOSIS — IMO0002 Reserved for concepts with insufficient information to code with codable children: Secondary | ICD-10-CM | POA: Diagnosis not present

## 2011-12-11 DIAGNOSIS — M47817 Spondylosis without myelopathy or radiculopathy, lumbosacral region: Secondary | ICD-10-CM | POA: Diagnosis not present

## 2012-01-03 DIAGNOSIS — M1990 Unspecified osteoarthritis, unspecified site: Secondary | ICD-10-CM | POA: Diagnosis not present

## 2012-01-03 DIAGNOSIS — IMO0002 Reserved for concepts with insufficient information to code with codable children: Secondary | ICD-10-CM | POA: Diagnosis not present

## 2012-01-03 DIAGNOSIS — M545 Low back pain, unspecified: Secondary | ICD-10-CM | POA: Diagnosis not present

## 2012-01-03 DIAGNOSIS — I10 Essential (primary) hypertension: Secondary | ICD-10-CM | POA: Diagnosis not present

## 2012-01-10 DIAGNOSIS — M48061 Spinal stenosis, lumbar region without neurogenic claudication: Secondary | ICD-10-CM | POA: Diagnosis not present

## 2012-01-10 DIAGNOSIS — M4310 Spondylolisthesis, site unspecified: Secondary | ICD-10-CM | POA: Diagnosis not present

## 2012-01-10 DIAGNOSIS — E119 Type 2 diabetes mellitus without complications: Secondary | ICD-10-CM | POA: Diagnosis not present

## 2012-01-10 DIAGNOSIS — I10 Essential (primary) hypertension: Secondary | ICD-10-CM | POA: Diagnosis not present

## 2012-01-20 ENCOUNTER — Telehealth: Payer: Self-pay | Admitting: *Deleted

## 2012-01-20 DIAGNOSIS — Z01818 Encounter for other preprocedural examination: Secondary | ICD-10-CM

## 2012-01-20 NOTE — Telephone Encounter (Signed)
PT AWARE  LEXISCAN SCHEDULED FOR 01-23-12 AT 8:15  FOR PRE OP CLEARANCE PT NEEDS BACK SURGERY .[name] [name]

## 2012-01-20 NOTE — Telephone Encounter (Signed)
SPOKE WITH PT RE  RECEIVING  FAX FROM DR Yetta Barre RE PT NEEDING BACK SURGERY PER DR NISHAN PT NEEDS LEXISCAN  MYOVIEW PRIOR  TO BEING CLEARED FOR  PROCEDURE PT AWARE OF ABOVE WILL TRY AND SCHEDULE PT THIS WEEK SO AS TO CLEAR FOR SURGERY./CY

## 2012-01-23 ENCOUNTER — Ambulatory Visit (HOSPITAL_COMMUNITY): Payer: Medicare Other | Attending: Cardiology | Admitting: Radiology

## 2012-01-23 VITALS — BP 167/91 | HR 61 | Ht 71.0 in | Wt 192.0 lb

## 2012-01-23 DIAGNOSIS — I2510 Atherosclerotic heart disease of native coronary artery without angina pectoris: Secondary | ICD-10-CM

## 2012-01-23 DIAGNOSIS — I10 Essential (primary) hypertension: Secondary | ICD-10-CM | POA: Diagnosis not present

## 2012-01-23 DIAGNOSIS — Z01810 Encounter for preprocedural cardiovascular examination: Secondary | ICD-10-CM | POA: Diagnosis not present

## 2012-01-23 DIAGNOSIS — Z8249 Family history of ischemic heart disease and other diseases of the circulatory system: Secondary | ICD-10-CM | POA: Diagnosis not present

## 2012-01-23 DIAGNOSIS — E119 Type 2 diabetes mellitus without complications: Secondary | ICD-10-CM | POA: Insufficient documentation

## 2012-01-23 DIAGNOSIS — I4510 Unspecified right bundle-branch block: Secondary | ICD-10-CM

## 2012-01-23 DIAGNOSIS — Z01818 Encounter for other preprocedural examination: Secondary | ICD-10-CM

## 2012-01-23 DIAGNOSIS — E785 Hyperlipidemia, unspecified: Secondary | ICD-10-CM | POA: Insufficient documentation

## 2012-01-23 DIAGNOSIS — I4949 Other premature depolarization: Secondary | ICD-10-CM | POA: Diagnosis not present

## 2012-01-23 MED ORDER — TECHNETIUM TC 99M TETROFOSMIN IV KIT
30.0000 | PACK | Freq: Once | INTRAVENOUS | Status: AC | PRN
  Administered 2012-01-23: 30 via INTRAVENOUS

## 2012-01-23 MED ORDER — REGADENOSON 0.4 MG/5ML IV SOLN
0.4000 mg | Freq: Once | INTRAVENOUS | Status: AC
  Administered 2012-01-23: 0.4 mg via INTRAVENOUS

## 2012-01-23 MED ORDER — TECHNETIUM TC 99M TETROFOSMIN IV KIT
10.0000 | PACK | Freq: Once | INTRAVENOUS | Status: AC | PRN
  Administered 2012-01-23: 10 via INTRAVENOUS

## 2012-01-23 NOTE — Progress Notes (Signed)
[facility] SITE 3 NUCLEAR MED [address] [place] [address] [phone]  Cardiology Nuclear Med Study  [name] [name] is a 78 y.o. male     MRN : [id number]     DOB: [birth year]  Procedure Date: 01/23/2012  Nuclear Med Background Indication for Stress Test:  Evaluation for Ischemia, Graft Patency and Clearance for Pending Back Surgery by Dr. Yetta Barre  History:  '07 MPS:(+)>CABG; '07 Echo:60% Cardiac Risk Factors: Family History - CAD, Hypertension, Lipids, NIDDM and RBBB  Symptoms:  No cardiac complaints.   Nuclear Pre-Procedure Caffeine/Decaff Intake:  None NPO After: 5:00pm   Lungs:  clear O2 Sat: 98% on room air. IV 0.9% NS with Angio Cath:  20g  IV Site: R Antecubital  IV Started by:  Stanton Kidney, EMT-P  Chest Size (in):  42  Cup Size: n/a  Height: 5\' 11"  (1.803 m)  Weight:  192 lb (87.091 kg)  BMI:  Body mass index is 26.78 kg/(m^2). Tech Comments:  Med's were not taken this day, per patient.    Nuclear Med Study 1 or 2 day study: 1 day  Stress Test Type:  Eugenie Birks  Reading MD: Marca Ancona, MD  Order Authorizing Provider:  Charlton Haws, MD  Resting Radionuclide: Technetium 61m Tetrofosmin  Resting Radionuclide Dose: 10.9 mCi   Stress Radionuclide:  Technetium 41m Tetrofosmin  Stress Radionuclide Dose: 32.9 mCi           Stress Protocol Rest HR: 61 Stress HR: 77  Rest BP: 167/91 Stress BP: 165/99  Exercise Time (min): n/a METS: n/a          Dose of Adenosine (mg):  n/a Dose of Lexiscan: 0.4 mg  Dose of Atropine (mg): n/a Dose of Dobutamine: n/a mcg/kg/min (at max HR)  Stress Test Technologist: Smiley Houseman, CMA-N  Nuclear Technologist:  Domenic Polite, CNMT     Rest Procedure:  Myocardial perfusion imaging was performed at rest 45 minutes following the intravenous administration of Technetium 70m Tetrofosmin.  Rest ECG:  RBBB and occasional PAC's, rare PVC.  Stress Procedure:  The patient received IV Lexiscan 0.4 mg over  15-seconds.  Technetium 52m Tetrofosmin injected at 30-seconds.  There were no significant changes with Lexiscan, occasional PJC's were noted.  He did c/o slight chest tightness with Lexiscan.  Quantitative spect images were obtained after a 45 minute delay.  Stress ECG: No significant change from baseline ECG  QPS Raw Data Images:  Normal; no motion artifact; normal heart/lung ratio. Stress Images:  Small moderate perfusion defect involving the apical inferior wall and true apex.  Rest Images:  Small mild perfusion defect involving the apical inferior wall and true apex Subtraction (SDS):  Partially reversible small apical inferior and true apex perfusion defect.  Transient Ischemic Dilatation (Normal <1.22):  1.07 Lung/Heart Ratio (Normal <0.45):  0.35  Quantitative Gated Spect Images QGS EDV:  92 ml QGS ESV:  35 ml  Impression Exercise Capacity:  Lexiscan with no exercise. BP Response:  Normal blood pressure response. Clinical Symptoms:  Dyspnea.  ECG Impression:  RBBB, no change with infusion.  Comparison with Prior Nuclear Study: No images to compare  Overall Impression:  Abnormal stress nuclear study.  There is a small, partially reversible apical inferior and true apex perfusion defect suggestive of prior MI with peri-infarction ischemia.  This defect looks like more than just apical thinning.   LV Ejection Fraction: 62%.  LV Wall Motion:  NL LV Function; NL Wall Motion  [employer]

## 2012-02-08 HISTORY — PX: LUMBAR FUSION: SHX111

## 2012-02-10 ENCOUNTER — Other Ambulatory Visit: Payer: Self-pay | Admitting: Neurological Surgery

## 2012-02-12 ENCOUNTER — Encounter (HOSPITAL_COMMUNITY): Payer: Self-pay | Admitting: Respiratory Therapy

## 2012-02-12 ENCOUNTER — Encounter (HOSPITAL_COMMUNITY): Payer: Self-pay | Admitting: *Deleted

## 2012-02-12 NOTE — Progress Notes (Signed)
CONFIRMED LAB APPOINTMENT WITH PATIENT 02/14/12 1400.

## 2012-02-12 NOTE — Pre-Procedure Instructions (Signed)
[address] [place]  02/12/2012   Your procedure is scheduled on: Thursday 02/20/12   Report to [facility] at 530 AM.  Call this number if you have problems the morning of surgery: [phone]   Remember:   Do not eat food:After Midnight.  May have clear liquids: up to 4 Hours before arrival.(until 130 am)  Clear liquids include soda, tea, black coffee, apple or grape juice, broth.  Take these medicines the morning of surgery with A SIP OF WATER: allopurinol, coreg(carvedilol), nexium, percocet, primidone(mysoline)    Do not wear jewelry, make-up or nail polish.  Do not wear lotions, powders, or perfumes. You may wear deodorant.  Do not shave 48 hours prior to surgery. Men may shave face and neck.  Do not bring valuables to the hospital.  Contacts, dentures or bridgework may not be worn into surgery.  Leave suitcase in the car. After surgery it may be brought to your room.  For patients admitted to the hospital, checkout time is 11:00 AM the day of discharge.   Patients discharged the day of surgery will not be allowed to drive home.  Name and phone number of your driver:   Special Instructions: CHG Shower Use Special Wash: 1/2 bottle night before surgery and 1/2 bottle morning of surgery.   Please read over the following fact sheets that you were given: Pain Booklet, Coughing and Deep Breathing, Blood Transfusion Information, MRSA Information and Surgical Site Infection Prevention

## 2012-02-14 ENCOUNTER — Encounter (HOSPITAL_COMMUNITY)
Admission: RE | Admit: 2012-02-14 | Discharge: 2012-02-14 | Disposition: A | Discharge status: Home / Self Care | Payer: Medicare Other | Source: Ambulatory Visit | Attending: Neurological Surgery | Admitting: Neurological Surgery

## 2012-02-14 DIAGNOSIS — Z01811 Encounter for preprocedural respiratory examination: Secondary | ICD-10-CM | POA: Diagnosis not present

## 2012-02-14 LAB — TYPE AND SCREEN: Antibody Screen: NEGATIVE

## 2012-02-14 LAB — BASIC METABOLIC PANEL
BUN: 25 mg/dL — ABNORMAL HIGH (ref 6–23)
Chloride: 100 mEq/L (ref 96–112)
Creatinine, Ser: 1.27 mg/dL (ref 0.50–1.35)
GFR calc Af Amer: 59 mL/min — ABNORMAL LOW (ref >90)
Glucose, Bld: 117 mg/dL — ABNORMAL HIGH (ref 70–99)
Potassium: 4.5 mEq/L (ref 3.5–5.1)

## 2012-02-14 LAB — CBC
HCT: 39.5 % (ref 39.0–52.0)
Hemoglobin: 13.7 g/dL (ref 13.0–17.0)
MCH: 32.6 pg (ref 26.0–34.0)
MCHC: 34.7 g/dL (ref 30.0–36.0)
MCV: 94 fL (ref 78.0–100.0)
RDW: 13.7 % (ref 11.5–15.5)

## 2012-02-14 LAB — DIFFERENTIAL
Eosinophils Absolute: 0.2 10*3/uL (ref 0.0–0.7)
Lymphocytes Relative: 36 % (ref 12–46)
Lymphs Abs: 3.1 10*3/uL (ref 0.7–4.0)
Monocytes Relative: 7 % (ref 3–12)
Neutro Abs: 4.6 10*3/uL (ref 1.7–7.7)
Neutrophils Relative %: 54 % (ref 43–77)

## 2012-02-14 LAB — SURGICAL PCR SCREEN: Staphylococcus aureus: NEGATIVE

## 2012-02-14 NOTE — Pre-Procedure Instructions (Signed)
[address] [place]  02/14/2012   Your procedure is scheduled on:  Thursday February 20, 2012.  Report to [facility] at 0530 AM.  Call this number if you have problems the morning of surgery: [phone]   Remember:   Do not eat food:After Midnight.  May have clear liquids: up to 4 Hours before arrival until 0130 am.  Clear liquids include soda, tea, black coffee, apple or grape juice, broth.  Take these medicines the morning of surgery with A SIP OF WATER: Allopurinol (Zyloprim), Carvedilol (Coreg), Esomeprazole (Nexium), Oxycodone (Percocet) if needed for pain, and Primidone (Mysoline).   Do not wear jewelry  Do not wear lotions or cologne.             Men may shave face and neck.  Do not bring valuables to the hospital.  Contacts, dentures or bridgework may not be worn into surgery.  Leave suitcase in the car. After surgery it may be brought to your room.  For patients admitted to the hospital, checkout time is 11:00 AM the day of discharge.   Patients discharged the day of surgery will not be allowed to drive home.  Name and phone number of your driver:   Special Instructions: CHG Shower Use Special Wash: 1/2 bottle night before surgery and 1/2 bottle morning of surgery.   Please read over the following fact sheets that you were given: Pain Booklet, Coughing and Deep Breathing, Blood Transfusion Information, MRSA Information and Surgical Site Infection Prevention

## 2012-02-17 NOTE — Consult Note (Signed)
Anesthesia Chart Review:  Patient is a 78 year old male scheduled for L4-5 PLIF on 02/20/12.  History includes non-smoker, DM2, HLD, HTN, benign essential tremor, gout, GERD, glomerulonephritis '65, prostate CA, and CAD s/p CABG X 4 '07 (LIMA -> LAD, SVG RCA, SVG -> OM1 -> OM2).  PCP is Dr. Crawford Givens.  His Cardiologist is Dr. Eden Emms, last visit was 09/20/11.  He recommended stress test preoperatively.    Stress test on 01/23/12 showed: Abnormal stress nuclear study. There is a small, partially reversible apical inferior and true apex perfusion defect suggestive of prior MI with peri-infarction ischemia. This defect looks like more than just apical thinning. LV Ejection Fraction: 62%. LV Wall Motion: NL LV Function; NL Wall Motion. Ultimately, Dr. Eden Emms said, "I think this is low risk for back surgery and he has no chest pain Continue beta blocker ok for surgery."  He remains on Coreg by medication list.  Unconfirmed EKG then showed SR with PACs, LAD, right BBB, cannot rule out inferior infarct (age undetermined).  It appeared overall stable when compared to his [facility] EKG from 09/20/11.    Last echo was on 01/29/06 and showed: - Overall left ventricular systolic function was normal. Left ventricular ejection fraction was estimated to be 60 %. There was focal basal septal hypertrophy. - Aortic valve thickness was mildly to moderately increased. There was lower normal aortic valve leaflet excursion. - Moderate sized anterior echodense region suspect fat pad but can't exclude organized effusion.  His last cath was on 01/27/06 prior to his CABG. (See Notes tab for complete report.)  CXR on 02/14/12 showed no acute cardiopulmonary abnormalities.  Labs noted.  Plan to proceed if no new acute CV symptoms.  Shonna Chock, PA-C

## 2012-02-19 MED ORDER — CEFAZOLIN SODIUM-DEXTROSE 2-3 GM-% IV SOLR
2.0000 g | INTRAVENOUS | Status: AC
  Administered 2012-02-20: 2 g via INTRAVENOUS
  Filled 2012-02-19: qty 50

## 2012-02-20 ENCOUNTER — Inpatient Hospital Stay (HOSPITAL_COMMUNITY): Payer: Medicare Other | Admitting: Vascular Surgery

## 2012-02-20 ENCOUNTER — Encounter (HOSPITAL_COMMUNITY): Payer: Self-pay | Admitting: Vascular Surgery

## 2012-02-20 ENCOUNTER — Encounter (HOSPITAL_COMMUNITY): Payer: Self-pay | Admitting: *Deleted

## 2012-02-20 ENCOUNTER — Inpatient Hospital Stay (HOSPITAL_COMMUNITY)
Admission: RE | Admit: 2012-02-20 | Discharge: 2012-02-22 | DRG: 460 | Disposition: A | Discharge status: Home / Self Care | Payer: Medicare Other | Source: Ambulatory Visit | Attending: Neurological Surgery | Admitting: Neurological Surgery

## 2012-02-20 ENCOUNTER — Encounter (HOSPITAL_COMMUNITY)
Admission: RE | Disposition: A | Discharge status: Home / Self Care | Payer: Self-pay | Source: Ambulatory Visit | Attending: Neurological Surgery

## 2012-02-20 ENCOUNTER — Encounter (HOSPITAL_COMMUNITY): Payer: Self-pay | Admitting: Anesthesiology

## 2012-02-20 ENCOUNTER — Inpatient Hospital Stay (HOSPITAL_COMMUNITY): Payer: Medicare Other

## 2012-02-20 DIAGNOSIS — M47817 Spondylosis without myelopathy or radiculopathy, lumbosacral region: Principal | ICD-10-CM | POA: Diagnosis present

## 2012-02-20 DIAGNOSIS — E039 Hypothyroidism, unspecified: Secondary | ICD-10-CM | POA: Diagnosis present

## 2012-02-20 DIAGNOSIS — Z01812 Encounter for preprocedural laboratory examination: Secondary | ICD-10-CM | POA: Diagnosis not present

## 2012-02-20 DIAGNOSIS — M4310 Spondylolisthesis, site unspecified: Secondary | ICD-10-CM | POA: Diagnosis not present

## 2012-02-20 DIAGNOSIS — I2510 Atherosclerotic heart disease of native coronary artery without angina pectoris: Secondary | ICD-10-CM | POA: Diagnosis present

## 2012-02-20 DIAGNOSIS — M549 Dorsalgia, unspecified: Secondary | ICD-10-CM

## 2012-02-20 DIAGNOSIS — I10 Essential (primary) hypertension: Secondary | ICD-10-CM | POA: Diagnosis not present

## 2012-02-20 DIAGNOSIS — M48061 Spinal stenosis, lumbar region without neurogenic claudication: Secondary | ICD-10-CM | POA: Diagnosis not present

## 2012-02-20 DIAGNOSIS — M79609 Pain in unspecified limb: Secondary | ICD-10-CM | POA: Diagnosis not present

## 2012-02-20 DIAGNOSIS — K219 Gastro-esophageal reflux disease without esophagitis: Secondary | ICD-10-CM | POA: Diagnosis present

## 2012-02-20 DIAGNOSIS — E119 Type 2 diabetes mellitus without complications: Secondary | ICD-10-CM | POA: Diagnosis present

## 2012-02-20 DIAGNOSIS — M545 Low back pain: Secondary | ICD-10-CM | POA: Diagnosis not present

## 2012-02-20 DIAGNOSIS — M519 Unspecified thoracic, thoracolumbar and lumbosacral intervertebral disc disorder: Secondary | ICD-10-CM | POA: Diagnosis not present

## 2012-02-20 DIAGNOSIS — Q762 Congenital spondylolisthesis: Secondary | ICD-10-CM | POA: Diagnosis not present

## 2012-02-20 LAB — GLUCOSE, CAPILLARY
Glucose-Capillary: 120 mg/dL — ABNORMAL HIGH (ref 70–99)
Glucose-Capillary: 201 mg/dL — ABNORMAL HIGH (ref 70–99)

## 2012-02-20 LAB — PROTIME-INR
INR: 1.02 (ref 0.00–1.49)
Prothrombin Time: 13.6 seconds (ref 11.6–15.2)

## 2012-02-20 SURGERY — POSTERIOR LUMBAR FUSION 1 LEVEL
Anesthesia: General | Site: Back | Laterality: Bilateral | Wound class: Clean

## 2012-02-20 MED ORDER — OXYCODONE-ACETAMINOPHEN 5-325 MG PO TABS
1.0000 | ORAL_TABLET | ORAL | Status: DC | PRN
  Administered 2012-02-20: 2 via ORAL
  Administered 2012-02-21: 1 via ORAL
  Administered 2012-02-21 – 2012-02-22 (×2): 2 via ORAL
  Filled 2012-02-20 (×5): qty 2

## 2012-02-20 MED ORDER — CYCLOBENZAPRINE HCL 10 MG PO TABS
10.0000 mg | ORAL_TABLET | Freq: Three times a day (TID) | ORAL | Status: DC | PRN
  Administered 2012-02-20 – 2012-02-21 (×2): 10 mg via ORAL
  Filled 2012-02-20 (×2): qty 1

## 2012-02-20 MED ORDER — GLYCOPYRROLATE 0.2 MG/ML IJ SOLN
INTRAMUSCULAR | Status: DC | PRN
  Administered 2012-02-20: .8 mg via INTRAVENOUS

## 2012-02-20 MED ORDER — PRIMIDONE 50 MG PO TABS
25.0000 mg | ORAL_TABLET | Freq: Every day | ORAL | Status: DC
  Administered 2012-02-21 – 2012-02-22 (×2): 25 mg via ORAL
  Filled 2012-02-20 (×3): qty 0.5

## 2012-02-20 MED ORDER — FENTANYL CITRATE 0.05 MG/ML IJ SOLN: 50.0000 ug | INTRAMUSCULAR | Status: DC | PRN

## 2012-02-20 MED ORDER — ONDANSETRON HCL 4 MG/2ML IJ SOLN
INTRAMUSCULAR | Status: DC | PRN
  Administered 2012-02-20: 4 mg via INTRAVENOUS

## 2012-02-20 MED ORDER — PROPOFOL 10 MG/ML IV EMUL
INTRAVENOUS | Status: DC | PRN
  Administered 2012-02-20: 140 mg via INTRAVENOUS

## 2012-02-20 MED ORDER — BACITRACIN 50000 UNITS IM SOLR
INTRAMUSCULAR | Status: AC
  Filled 2012-02-20: qty 1

## 2012-02-20 MED ORDER — ZOLPIDEM TARTRATE 5 MG PO TABS
5.0000 mg | ORAL_TABLET | Freq: Every evening | ORAL | Status: DC | PRN
  Administered 2012-02-21: 5 mg via ORAL
  Filled 2012-02-20: qty 1

## 2012-02-20 MED ORDER — PIOGLITAZONE HCL 15 MG PO TABS
15.0000 mg | ORAL_TABLET | Freq: Every day | ORAL | Status: DC
  Administered 2012-02-21 – 2012-02-22 (×2): 15 mg via ORAL
  Filled 2012-02-20 (×3): qty 1

## 2012-02-20 MED ORDER — CEFAZOLIN SODIUM 1-5 GM-% IV SOLN
1.0000 g | Freq: Three times a day (TID) | INTRAVENOUS | Status: AC
  Administered 2012-02-20 – 2012-02-21 (×2): 1 g via INTRAVENOUS
  Filled 2012-02-20 (×2): qty 50

## 2012-02-20 MED ORDER — ONDANSETRON HCL 4 MG/2ML IJ SOLN: 4.0000 mg | INTRAMUSCULAR | Status: DC | PRN

## 2012-02-20 MED ORDER — PANTOPRAZOLE SODIUM 40 MG PO TBEC
40.0000 mg | DELAYED_RELEASE_TABLET | Freq: Every day | ORAL | Status: DC
  Administered 2012-02-20 – 2012-02-22 (×3): 40 mg via ORAL
  Filled 2012-02-20 (×3): qty 1

## 2012-02-20 MED ORDER — BACITRACIN 50000 UNITS IM SOLR
INTRAMUSCULAR | Status: DC | PRN
  Administered 2012-02-20: 09:00:00

## 2012-02-20 MED ORDER — THROMBIN 20000 UNITS EX KIT
PACK | CUTANEOUS | Status: DC | PRN
  Administered 2012-02-20: 20000 [IU] via TOPICAL

## 2012-02-20 MED ORDER — ROCURONIUM BROMIDE 100 MG/10ML IV SOLN
INTRAVENOUS | Status: DC | PRN
  Administered 2012-02-20: 20 mg via INTRAVENOUS
  Administered 2012-02-20: 10 mg via INTRAVENOUS
  Administered 2012-02-20: 20 mg via INTRAVENOUS
  Administered 2012-02-20: 50 mg via INTRAVENOUS

## 2012-02-20 MED ORDER — PHENYLEPHRINE HCL 10 MG/ML IJ SOLN
10.0000 mg | INTRAVENOUS | Status: DC | PRN
  Administered 2012-02-20: 50 ug/min via INTRAVENOUS

## 2012-02-20 MED ORDER — MIDAZOLAM HCL 2 MG/2ML IJ SOLN: 1.0000 mg | INTRAMUSCULAR | Status: DC | PRN

## 2012-02-20 MED ORDER — DEXAMETHASONE SODIUM PHOSPHATE 10 MG/ML IJ SOLN
10.0000 mg | INTRAMUSCULAR | Status: AC
  Administered 2012-02-20: 10 mg via INTRAVENOUS

## 2012-02-20 MED ORDER — LACTATED RINGERS IV SOLN
INTRAVENOUS | Status: DC | PRN
  Administered 2012-02-20 (×3): via INTRAVENOUS

## 2012-02-20 MED ORDER — PHENOL 1.4 % MT LIQD: 1.0000 | OROMUCOSAL | Status: DC | PRN

## 2012-02-20 MED ORDER — SODIUM CHLORIDE 0.9 % IJ SOLN
3.0000 mL | Freq: Two times a day (BID) | INTRAMUSCULAR | Status: DC
  Administered 2012-02-21: 3 mL via INTRAVENOUS

## 2012-02-20 MED ORDER — LIDOCAINE HCL (CARDIAC) 20 MG/ML IV SOLN
INTRAVENOUS | Status: DC | PRN
  Administered 2012-02-20: 90 mg via INTRAVENOUS

## 2012-02-20 MED ORDER — LISINOPRIL 20 MG PO TABS
20.0000 mg | ORAL_TABLET | Freq: Every day | ORAL | Status: DC
  Administered 2012-02-20 – 2012-02-22 (×3): 20 mg via ORAL
  Filled 2012-02-20 (×3): qty 1

## 2012-02-20 MED ORDER — ASPIRIN 81 MG PO TABS: 81.0000 mg | ORAL_TABLET | Freq: Every day | ORAL | Status: DC

## 2012-02-20 MED ORDER — HYDROMORPHONE HCL PF 1 MG/ML IJ SOLN
INTRAMUSCULAR | Status: AC
  Filled 2012-02-20: qty 1

## 2012-02-20 MED ORDER — FENTANYL CITRATE 0.05 MG/ML IJ SOLN
INTRAMUSCULAR | Status: DC | PRN
  Administered 2012-02-20 (×2): 25 ug via INTRAVENOUS
  Administered 2012-02-20: 50 ug via INTRAVENOUS
  Administered 2012-02-20: 25 ug via INTRAVENOUS
  Administered 2012-02-20: 75 ug via INTRAVENOUS

## 2012-02-20 MED ORDER — HYDROMORPHONE HCL PF 1 MG/ML IJ SOLN
0.2500 mg | INTRAMUSCULAR | Status: DC | PRN
  Administered 2012-02-20 (×3): 0.5 mg via INTRAVENOUS

## 2012-02-20 MED ORDER — MIDAZOLAM HCL 5 MG/5ML IJ SOLN
INTRAMUSCULAR | Status: DC | PRN
  Administered 2012-02-20: 1 mg via INTRAVENOUS

## 2012-02-20 MED ORDER — VECURONIUM BROMIDE 10 MG IV SOLR
INTRAVENOUS | Status: DC | PRN
  Administered 2012-02-20 (×2): 1 mg via INTRAVENOUS

## 2012-02-20 MED ORDER — MENTHOL 3 MG MT LOZG: 1.0000 | LOZENGE | OROMUCOSAL | Status: DC | PRN

## 2012-02-20 MED ORDER — SODIUM CHLORIDE 0.9 % IV SOLN
INTRAVENOUS | Status: AC
  Filled 2012-02-20: qty 500

## 2012-02-20 MED ORDER — HYDROMORPHONE HCL PF 1 MG/ML IJ SOLN
0.5000 mg | INTRAMUSCULAR | Status: DC | PRN
  Administered 2012-02-20 – 2012-02-21 (×2): 1 mg via INTRAVENOUS
  Filled 2012-02-20 (×2): qty 1

## 2012-02-20 MED ORDER — SENNA 8.6 MG PO TABS
1.0000 | ORAL_TABLET | Freq: Two times a day (BID) | ORAL | Status: DC
  Administered 2012-02-20 – 2012-02-22 (×5): 8.6 mg via ORAL
  Filled 2012-02-20 (×6): qty 1

## 2012-02-20 MED ORDER — ACETAMINOPHEN 10 MG/ML IV SOLN
INTRAVENOUS | Status: AC
  Administered 2012-02-20: 1000 mg via INTRAVENOUS
  Filled 2012-02-20: qty 100

## 2012-02-20 MED ORDER — CARVEDILOL PHOSPHATE ER 20 MG PO CP24
20.0000 mg | ORAL_CAPSULE | Freq: Every day | ORAL | Status: DC
  Administered 2012-02-21 – 2012-02-22 (×2): 20 mg via ORAL
  Filled 2012-02-20 (×3): qty 1

## 2012-02-20 MED ORDER — ACETAMINOPHEN 325 MG PO TABS: 650.0000 mg | ORAL_TABLET | ORAL | Status: DC | PRN

## 2012-02-20 MED ORDER — SODIUM CHLORIDE 0.9 % IV SOLN: 250.0000 mL | INTRAVENOUS | Status: DC

## 2012-02-20 MED ORDER — ASPIRIN EC 81 MG PO TBEC
81.0000 mg | DELAYED_RELEASE_TABLET | Freq: Every day | ORAL | Status: DC
  Administered 2012-02-21 – 2012-02-22 (×2): 81 mg via ORAL
  Filled 2012-02-20 (×2): qty 1

## 2012-02-20 MED ORDER — BUPIVACAINE HCL (PF) 0.25 % IJ SOLN
INTRAMUSCULAR | Status: DC | PRN
  Administered 2012-02-20: 8 mL

## 2012-02-20 MED ORDER — DEXAMETHASONE SODIUM PHOSPHATE 10 MG/ML IJ SOLN
INTRAMUSCULAR | Status: AC
  Filled 2012-02-20: qty 1

## 2012-02-20 MED ORDER — POTASSIUM CHLORIDE IN NACL 20-0.9 MEQ/L-% IV SOLN
INTRAVENOUS | Status: DC
  Administered 2012-02-20 – 2012-02-21 (×2): via INTRAVENOUS
  Filled 2012-02-20 (×5): qty 1000

## 2012-02-20 MED ORDER — 0.9 % SODIUM CHLORIDE (POUR BTL) OPTIME
TOPICAL | Status: DC | PRN
  Administered 2012-02-20: 1000 mL

## 2012-02-20 MED ORDER — ACETAMINOPHEN 650 MG RE SUPP: 650.0000 mg | RECTAL | Status: DC | PRN

## 2012-02-20 MED ORDER — ACETAMINOPHEN 10 MG/ML IV SOLN
1000.0000 mg | Freq: Four times a day (QID) | INTRAVENOUS | Status: AC
  Administered 2012-02-20 – 2012-02-21 (×3): 1000 mg via INTRAVENOUS
  Filled 2012-02-20 (×4): qty 100

## 2012-02-20 MED ORDER — HEMOSTATIC AGENTS (NO CHARGE) OPTIME
TOPICAL | Status: DC | PRN
  Administered 2012-02-20: 1 via TOPICAL

## 2012-02-20 MED ORDER — SODIUM CHLORIDE 0.9 % IJ SOLN: 3.0000 mL | INTRAMUSCULAR | Status: DC | PRN

## 2012-02-20 MED ORDER — ALLOPURINOL 300 MG PO TABS
300.0000 mg | ORAL_TABLET | Freq: Every day | ORAL | Status: DC
  Administered 2012-02-21 – 2012-02-22 (×2): 300 mg via ORAL
  Filled 2012-02-20 (×3): qty 1

## 2012-02-20 MED ORDER — NEOSTIGMINE METHYLSULFATE 1 MG/ML IJ SOLN
INTRAMUSCULAR | Status: DC | PRN
  Administered 2012-02-20: 5 mg via INTRAVENOUS

## 2012-02-20 MED ORDER — LORAZEPAM 2 MG/ML IJ SOLN: 1.0000 mg | Freq: Once | INTRAMUSCULAR | Status: DC | PRN

## 2012-02-20 SURGICAL SUPPLY — 60 items
BAG DECANTER FOR FLEXI CONT (MISCELLANEOUS) ×2 IMPLANT
BENZOIN TINCTURE PRP APPL 2/3 (GAUZE/BANDAGES/DRESSINGS) ×2 IMPLANT
BLADE SURG ROTATE 9660 (MISCELLANEOUS) IMPLANT
BUR MATCHSTICK NEURO 3.0 LAGG (BURR) ×2 IMPLANT
CANISTER SUCTION 2500CC (MISCELLANEOUS) ×2 IMPLANT
CLOTH BEACON ORANGE TIMEOUT ST (SAFETY) ×2 IMPLANT
CONT SPEC 4OZ CLIKSEAL STRL BL (MISCELLANEOUS) ×4 IMPLANT
COVER BACK TABLE 24X17X13 BIG (DRAPES) IMPLANT
COVER TABLE BACK 60X90 (DRAPES) ×2 IMPLANT
DRAPE C-ARM 42X72 X-RAY (DRAPES) ×4 IMPLANT
DRAPE LAPAROTOMY 100X72X124 (DRAPES) ×2 IMPLANT
DRAPE POUCH INSTRU U-SHP 10X18 (DRAPES) ×2 IMPLANT
DRAPE SURG 17X23 STRL (DRAPES) ×2 IMPLANT
DRESSING TELFA 8X3 (GAUZE/BANDAGES/DRESSINGS) ×2 IMPLANT
DRSG OPSITE 4X5.5 SM (GAUZE/BANDAGES/DRESSINGS) ×4 IMPLANT
DURAPREP 26ML APPLICATOR (WOUND CARE) ×2 IMPLANT
ELECT REM PT RETURN 9FT ADLT (ELECTROSURGICAL) ×2
ELECTRODE REM PT RTRN 9FT ADLT (ELECTROSURGICAL) ×1 IMPLANT
EVACUATOR 1/8 PVC DRAIN (DRAIN) ×2 IMPLANT
GAUZE SPONGE 4X4 16PLY XRAY LF (GAUZE/BANDAGES/DRESSINGS) IMPLANT
GLOVE BIO SURGEON STRL SZ8 (GLOVE) ×6 IMPLANT
GLOVE BIOGEL PI IND STRL 6.5 (GLOVE) ×1 IMPLANT
GLOVE BIOGEL PI IND STRL 8.5 (GLOVE) ×1 IMPLANT
GLOVE BIOGEL PI INDICATOR 6.5 (GLOVE) ×1
GLOVE BIOGEL PI INDICATOR 8.5 (GLOVE) ×1
GLOVE SURG SS PI 6.5 STRL IVOR (GLOVE) ×6 IMPLANT
GLOVE SURG SS PI 7.0 STRL IVOR (GLOVE) ×2 IMPLANT
GOWN BRE IMP SLV AUR LG STRL (GOWN DISPOSABLE) ×2 IMPLANT
GOWN BRE IMP SLV AUR XL STRL (GOWN DISPOSABLE) ×6 IMPLANT
GOWN STRL REIN 2XL LVL4 (GOWN DISPOSABLE) IMPLANT
HEMOSTAT POWDER KIT SURGIFOAM (HEMOSTASIS) IMPLANT
KIT BASIN OR (CUSTOM PROCEDURE TRAY) ×2 IMPLANT
KIT ROOM TURNOVER OR (KITS) ×2 IMPLANT
MILL MEDIUM DISP (BLADE) ×2 IMPLANT
NEEDLE HYPO 25X1 1.5 SAFETY (NEEDLE) ×2 IMPLANT
NS IRRIG 1000ML POUR BTL (IV SOLUTION) ×2 IMPLANT
PACK FOAM VITOSS 5CC (Orthopedic Implant) ×2 IMPLANT
PACK LAMINECTOMY NEURO (CUSTOM PROCEDURE TRAY) ×2 IMPLANT
PAD ARMBOARD 7.5X6 YLW CONV (MISCELLANEOUS) ×6 IMPLANT
ROD 40MM (Rod) ×2 IMPLANT
ROD SPNL 40X5.5XPREBNT NS (Rod) ×2 IMPLANT
SCREW LOCK (Screw) ×4 IMPLANT
SCREW LOCK 100X5.5X OPN (Screw) ×4 IMPLANT
SCREW POLY 45X6.5 (Screw) ×4 IMPLANT
SCREW POLY 6.5X45MM (Screw) ×4 IMPLANT
SPONGE LAP 4X18 X RAY DECT (DISPOSABLE) IMPLANT
SPONGE SURGIFOAM ABS GEL 100 (HEMOSTASIS) ×2 IMPLANT
STRIP CLOSURE SKIN 1/2X4 (GAUZE/BANDAGES/DRESSINGS) ×2 IMPLANT
SUT VIC AB 0 CT1 18XCR BRD8 (SUTURE) ×1 IMPLANT
SUT VIC AB 0 CT1 8-18 (SUTURE) ×1
SUT VIC AB 2-0 CP2 18 (SUTURE) ×2 IMPLANT
SUT VIC AB 3-0 SH 8-18 (SUTURE) ×4 IMPLANT
SYR 20ML ECCENTRIC (SYRINGE) ×2 IMPLANT
TELAMON P VBS 3 26X10MM (Cage) ×2 IMPLANT
TOWEL OR 17X24 6PK STRL BLUE (TOWEL DISPOSABLE) ×2 IMPLANT
TOWEL OR 17X26 10 PK STRL BLUE (TOWEL DISPOSABLE) ×2 IMPLANT
TRAP SPECIMEN MUCOUS 40CC (MISCELLANEOUS) IMPLANT
TRAY FOLEY CATH 14FRSI W/METER (CATHETERS) ×2 IMPLANT
WATER STERILE IRR 1000ML POUR (IV SOLUTION) ×2 IMPLANT
WEDGE TANGENT 10X26MM ×2 IMPLANT

## 2012-02-20 NOTE — Transfer of Care (Signed)
Immediate Anesthesia Transfer of Care Note  Patient: [name] [name]  Procedure(s) Performed: Procedure(s) (LRB): POSTERIOR LUMBAR FUSION 1 LEVEL (Bilateral)  Patient Location: PACU  Anesthesia Type: General  Level of Consciousness: awake, alert , patient cooperative and responds to stimulation  Airway & Oxygen Therapy: Patient Spontanous Breathing  Post-op Assessment: Report given to PACU RN, Post -op Vital signs reviewed and stable and Patient moving all extremities  Post vital signs: Reviewed and stable  Complications: No apparent anesthesia complications

## 2012-02-20 NOTE — Anesthesia Procedure Notes (Signed)
Procedure Name: Intubation Date/Time: 02/20/2012 7:45 AM Performed by: Ozzie Remmers, Nuala Alpha Pre-anesthesia Checklist: Patient identified, Emergency Drugs available, Suction available, Patient being monitored and Timeout performed Patient Re-evaluated:Patient Re-evaluated prior to inductionOxygen Delivery Method: Circle system utilized and Simple face mask Preoxygenation: Pre-oxygenation with 100% oxygen Intubation Type: IV induction Ventilation: Mask ventilation without difficulty Laryngoscope Size: Miller and 2 Grade View: Grade I Tube type: Oral Tube size: 8.0 mm Number of attempts: 1 Airway Equipment and Method: Stylet Placement Confirmation: ETT inserted through vocal cords under direct vision,  positive ETCO2,  CO2 detector and breath sounds checked- equal and bilateral Secured at: 22 cm Tube secured with: Tape Dental Injury: Teeth and Oropharynx as per pre-operative assessment

## 2012-02-20 NOTE — Op Note (Signed)
02/20/2012  10:57 AM  PATIENT:  [name] [name]  78 y.o. male  PRE-OPERATIVE DIAGNOSIS:  Grade 1 spondylolisthesis with severe spinal stenosis L4-5, back and leg pain with leg weakness  POST-OPERATIVE DIAGNOSIS:  Same  PROCEDURE:   1. Decompressive Gill-type lumbar laminectomy L4-5 requiring more work than would be required of the typical PLIF procedure in order to adequately decompress the neural elements.  2. Posterior lumbar interbody fusion L4-5 using a PEEK interbody cage packed with morcellized allograft and autograft and an allograft wedge.  3. Posterior fixation L4-5 using Nuvasive pedicle screws.  4. Intertransverse arthrodesis L4-5 using morcellized autograft and allograft.  SURGEON:  Marikay Alar, MD  ASSISTANTS: Dr. Venetia Maxon  ANESTHESIA:  General  EBL: 250 ml  Total I/O In: 2100 [I.V.:2100] Out: 640 [Urine:390; Blood:250]  BLOOD ADMINISTERED:none  DRAINS: Hemovac   INDICATION FOR PROCEDURE: Patient presented with back pain when he walks. He had some leg pain and complaint of leg weakness tiredness when he walk. He had plain films and an MRI which showed grade 1 spondylolisthesis with severe stenosis at L4-5. He tried medical management without relief. Recommended a decompression and instrumented fusion at L4-5. Patient understood the risks, benefits, and alternatives and potential outcomes and wished to proceed.  PROCEDURE DETAILS:  The patient was brought to the operating room. After induction of generalized endotracheal anesthesia the patient was rolled into the prone position on chest rolls and all pressure points were padded. The patient's lumbar region was cleaned and then prepped with DuraPrep and draped in the usual sterile fashion. Anesthesia was injected and then a dorsal midline incision was made and carried down to the lumbosacral fascia. The fascia was opened and the paraspinous musculature was taken down in a subperiosteal fashion to expose L4-5.  Intraoperative fluoroscopy confirmed my level, and the spinous process was removed and complete lumbar laminectomies, hemi- facetectomies, and foraminotomies were performed at L45. The yellow ligament was removed to expose the underlying dura and nerve roots, and generous foraminotomies were performed to adequately decompress the neural elements. Once the decompression was complete, I turned my attention to the posterior lower lumbar interbody fusion. The epidural venous vasculature was coagulated and cut sharply. Disc space was incised and the initial discectomy was performed with pituitary rongeurs. The disc space was distracted with sequential distractors to a height of 10 mm. We then used a series of scrapers and shavers to prepare the endplates for fusion. The midline was prepared with Epstein curettes. Once the complete discectomy was finished, we packed an appropriate sized peek interbody cage with local autograft and morcellized allograft, gently retracted the nerve root, and tapped the cage into position at L4-5. We also tapped a tangent interbody bone wedge into the opposite side. The midline was packed with morselized autograft and allograft. We then turned our attention to the posterior fixation. The pedicle screw entry zones were identified utilizing surface landmarks and fluoroscopy. We probed each pedicle with the pedicle probe and tapped each pedicle with the appropriate tap. We palpated with a ball probe to assure no break in the cortex. We then placed 6 5 x 45 mm pedicle screws into the pedicles bilaterally at L4-5. We then decorticated the transverse processes and laid a mixture of morcellized autograft and allograft out over these to perform intertransverse arthrodesis at L45. We then placed lordotic rods into the multiaxial screw heads of the pedicle screws and locked these in position with the locking caps and anti-torque device. We achieved compression of  our grafts. We then checked our  construct with AP and lateral fluoroscopy. Irrigated with copious amounts of bacitracin-containing saline solution. Placed a medium Hemovac drain through separate stab incision. Inspected the nerve roots once again to assure adequate decompression, lined to the dura with Gelfoam, and closed the muscle and the fascia with 0 Vicryl. Closed the subcutaneous tissues with 2-0 Vicryl and subcuticular tissues with 3-0 Vicryl. The skin was closed with benzoin and Steri-Strips. Dressing was then applied, the patient was awakened from general anesthesia and transported to the recovery room in stable condition. At the end of the procedure all sponge, needle and instrument counts were correct.   PLAN OF CARE: Admit to inpatient   PATIENT DISPOSITION:  PACU - hemodynamically stable.   Delay start of Pharmacological VTE agent (>24hrs) due to surgical blood loss or risk of bleeding:  yes

## 2012-02-20 NOTE — Preoperative (Signed)
Beta Blockers   Reason not to administer Beta Blockers:Not Applicable 

## 2012-02-20 NOTE — H&P (Signed)
Subjective: Patient is a 78 y.o. male admitted for PLIF L4-5. Onset of symptoms was months ago, progressively worse since that time.  The pain is rated moderately severe and is located at the low back and radiates to legs with leg weakness. The pain is described as aching and occurs with walking. The symptoms have been progressive. Symptoms are exacerbated by exercise. MRI or CT showed spondylolisthesis with stenosis L4-5.   Past Medical History  Diagnosis Date  . Diabetes mellitus   . Hypertension 1995  . Hyperlipidemia 12/2005  . Benign essential tremor 2005  . Gout 1995  . GERD (gastroesophageal reflux disease)   . Coronary artery disease     s/p CABG, DR Eden Emms  . Glomerulonephritis 1965    3 months "Rest"  . Prostate cancer     S/P treatment, followed by Urology    Past Surgical History  Procedure Date  . Abdominal cat 02/15/2004    (with and without) - Left renal cyst, 3-4 cm  . Cystoscopy 02/15/2004    Mod BPH, moder tribec ?  . Cryotherapy of prostate 09/18/2005    2nd, prostate CA  . Adenosine myoview 01/24/2006    Ischemia by EKG, Cath  . Abdominal cat 01/25/2006    Negative AAA  . Coronary artery bypass graft   . Coronary artery bypass graft 01/31/2006    X 4    Prior to Admission medications   Medication Sig Start Date End Date Taking? Authorizing Provider  allopurinol (ZYLOPRIM) 300 MG tablet Take 1 tablet (300 mg total) by mouth daily. 10/18/11  Yes Joaquim Nam, MD  atorvastatin (LIPITOR) 40 MG tablet Take 1 tablet (40 mg total) by mouth daily. 10/18/11 10/17/12 Yes Joaquim Nam, MD  carvedilol (COREG CR) 20 MG 24 hr capsule Take 1 capsule (20 mg total) by mouth daily. 09/20/11  Yes Wendall Stade, MD  esomeprazole (NEXIUM) 40 MG capsule Take 1 capsule (40 mg total) by mouth daily before breakfast. 10/18/11  Yes Joaquim Nam, MD  lisinopril (PRINIVIL,ZESTRIL) 20 MG tablet Take 1 tablet (20 mg total) by mouth daily. 11/19/11  Yes Wendall Stade, MD    oxyCODONE-acetaminophen (PERCOCET) 5-325 MG per tablet Take 1 tablet by mouth every 4 (four) hours as needed. For pain   Yes Historical Provider, MD  pioglitazone (ACTOS) 15 MG tablet Take 1 tablet (15 mg total) by mouth daily. 10/18/11  Yes Joaquim Nam, MD  primidone (MYSOLINE) 50 MG tablet Take 0.5 tablets (25 mg total) by mouth daily. 10/18/11  Yes Joaquim Nam, MD  rosuvastatin (CRESTOR) 20 MG tablet Take 20 mg by mouth daily.   Yes Historical Provider, MD  aspirin 81 MG tablet Take 81 mg by mouth daily.     Historical Provider, MD   Allergies  Allergen Reactions  . Sulfonamide Derivatives     REACTION: lethargic    History  Substance Use Topics  . Smoking status: Never Smoker   . Smokeless tobacco: Never Used  . Alcohol Use: No    Family History  Problem Relation Age of Onset  . Heart disease Mother   . Cancer Mother   . Diabetes Sister   . Diabetes Sister   . Cancer Brother     prostate  . Hypothyroidism Brother   . Depression Neg Hx   . Alcohol abuse Neg Hx   . Drug abuse Neg Hx   . Stroke Neg Hx   . Colon cancer Neg Hx  Review of Systems  Positive ROS: neg  All other systems have been reviewed and were otherwise negative with the exception of those mentioned in the HPI and as above.  Objective: Vital signs in last 24 hours:    General Appearance: Alert, cooperative, no distress, appears stated age Head: Normocephalic, without obvious abnormality, atraumatic Eyes: PERRL, conjunctiva/corneas clear, EOM's intact, fundi benign, both eyes      Ears: Normal TM's and external ear canals, both ears Throat: Lips, mucosa, and tongue normal; teeth and gums normal Neck: Supple, symmetrical, trachea midline, no adenopathy; thyroid: No enlargement/tenderness/nodules; no carotid bruit or JVD Back: Symmetric, no curvature, ROM normal, no CVA tenderness Lungs: Clear to auscultation bilaterally, respirations unlabored Heart: Regular rate and rhythm, S1 and S2 normal,  no murmur, rub or gallop Abdomen: Soft, non-tender, bowel sounds active all four quadrants, no masses, no organomegaly Extremities: Extremities normal, atraumatic, no cyanosis or edema Pulses: 2+ and symmetric all extremities Skin: Skin color, texture, turgor normal, no rashes or lesions  NEUROLOGIC:   Mental status: Alert and oriented x4,  no aphasia, good attention span, fund of knowledge, and memory Motor Exam - grossly normal Sensory Exam - grossly normal Reflexes: 1+ Coordination - grossly normal Gait - grossly normal Balance - grossly normal Cranial Nerves: I: smell Not tested  II: visual acuity  OS: nl    OD: nl  II: visual fields Full to confrontation  II: pupils Equal, round, reactive to light  III,VII: ptosis None  III,IV,VI: extraocular muscles  Full ROM  V: mastication Normal  V: facial light touch sensation  Normal  V,VII: corneal reflex  Present  VII: facial muscle function - upper  Normal  VII: facial muscle function - lower Normal  VIII: hearing Not tested  IX: soft palate elevation  Normal  IX,X: gag reflex Present  XI: trapezius strength  5/5  XI: sternocleidomastoid strength 5/5  XI: neck flexion strength  5/5  XII: tongue strength  Normal    Data Review Lab Results  Component Value Date   WBC 8.6 02/14/2012   HGB 13.7 02/14/2012   HCT 39.5 02/14/2012   MCV 94.0 02/14/2012   PLT 213 02/14/2012   Lab Results  Component Value Date   NA 135 02/14/2012   K 4.5 02/14/2012   CL 100 02/14/2012   CO2 24 02/14/2012   BUN 25* 02/14/2012   CREATININE 1.27 02/14/2012   GLUCOSE 117* 02/14/2012   No results found for this basename: INR, PROTIME    Assessment/Plan: Patient admitted for PLIF L4-5. Patient has failed conservative therapy.  I explained the condition and procedure to the patient and answered any questions.  Patient wishes to proceed with procedure as planned. Understands risks/ benefits and typical outcomes of procedure.   Tyric Rodeheaver S 02/20/2012 6:07  AM

## 2012-02-20 NOTE — Anesthesia Preprocedure Evaluation (Signed)
Anesthesia Evaluation  Patient identified by MRN, date of birth, ID band Patient awake    Reviewed: Allergy & Precautions, H&P , NPO status , Patient's Chart, lab work & pertinent test results  Airway Mallampati: II TM Distance: >3 FB Neck ROM: Full    Dental   Pulmonary    Pulmonary exam normal       Cardiovascular hypertension, Pt. on medications + CAD  rbbb   Neuro/Psych    GI/Hepatic GERD-  ,  Endo/Other  Diabetes mellitus-, Well ControlledHypothyroidism   Renal/GU      Musculoskeletal   Abdominal   Peds  Hematology   Anesthesia Other Findings   Reproductive/Obstetrics                           Anesthesia Physical Anesthesia Plan  ASA: III  Anesthesia Plan: General   Post-op Pain Management:    Induction: Intravenous  Airway Management Planned: Oral ETT  Additional Equipment:   Intra-op Plan:   Post-operative Plan: Extubation in OR  Informed Consent: I have reviewed the patients History and Physical, chart, labs and discussed the procedure including the risks, benefits and alternatives for the proposed anesthesia with the patient or authorized representative who has indicated his/her understanding and acceptance.     Plan Discussed with: CRNA and Surgeon  Anesthesia Plan Comments:         Anesthesia Quick Evaluation

## 2012-02-20 NOTE — Progress Notes (Signed)
UR COMPLETED  

## 2012-02-20 NOTE — Anesthesia Postprocedure Evaluation (Signed)
  Anesthesia Post-op Note  Patient: [name] [name]  Procedure(s) Performed: Procedure(s) (LRB): POSTERIOR LUMBAR FUSION 1 LEVEL (Bilateral)  Patient Location: PACU  Anesthesia Type: General  Level of Consciousness: awake and alert   Airway and Oxygen Therapy: Patient Spontanous Breathing  Post-op Pain: mild  Post-op Assessment: Post-op Vital signs reviewed, Patient's Cardiovascular Status Stable, Respiratory Function Stable, Patent Airway, No signs of Nausea or vomiting and Pain level controlled  Post-op Vital Signs: stable  Complications: No apparent anesthesia complications

## 2012-02-21 LAB — GLUCOSE, CAPILLARY

## 2012-02-21 NOTE — Progress Notes (Signed)
Patient ID: [name] [name], male   DOB: 04-19-1929, 78 y.o.   MRN: [id number] Subjective: Patient reports appropriate back soreness, no leg pain.  Objective: Vital signs in last 24 hours: Temp:  [97.6 F (36.4 C)-98.4 F (36.9 C)] 98.1 F (36.7 C) (06/14 0600) Pulse Rate:  [59-75] 75  (06/14 0600) Resp:  [16-18] 18  (06/14 0600) BP: (122-151)/(63-83) 134/69 mmHg (06/14 0600) SpO2:  [94 %-99 %] 97 % (06/14 0600) Weight:  [95.255 kg (210 lb)] 95.255 kg (210 lb) (06/13 1205)  Intake/Output from previous day: 06/13 0701 - 06/14 0700 In: 3390 [P.O.:1060; I.V.:2100] Out: 2440 [Urine:2165; Drains:25; Blood:250] Intake/Output this shift:    Neurologic: Grossly normal  Lab Results: Lab Results  Component Value Date   WBC 8.6 02/14/2012   HGB 13.7 02/14/2012   HCT 39.5 02/14/2012   MCV 94.0 02/14/2012   PLT 213 02/14/2012   Lab Results  Component Value Date   INR 1.02 02/20/2012   BMET Lab Results  Component Value Date   NA 135 02/14/2012   K 4.5 02/14/2012   CL 100 02/14/2012   CO2 24 02/14/2012   GLUCOSE 117* 02/14/2012   BUN 25* 02/14/2012   CREATININE 1.27 02/14/2012   CALCIUM 9.7 02/14/2012    Studies/Results: Dg Lumbar Spine 2-3 Views  02/20/2012  *RADIOLOGY REPORT*  Clinical Data: L4-5 PLIF  DG C-ARM 61-120 MIN,LUMBAR SPINE - 2-3 VIEW  Technique: Two fluoroscopic intraoperative spot views of the lower lumbar spine provided.  Comparison:  CT abdomen and pelvis 01/28/2006.  Findings: There is transitional anatomy at the lumbosacral junction. The last fully open disc spaces labeled L5-S1.  Images demonstrate pedicle screws stabilization bars with interbody spacer placed at L4-5.  IMPRESSION: L4-5 PLIF.  Original Report Authenticated By: Bernadene Bell. D'ALESSIO, M.D.   Dg C-arm 61-120 Min  02/20/2012  *RADIOLOGY REPORT*  Clinical Data: L4-5 PLIF  DG C-ARM 61-120 MIN,LUMBAR SPINE - 2-3 VIEW  Technique: Two fluoroscopic intraoperative spot views of the lower lumbar spine provided.  Comparison:  CT  abdomen and pelvis 01/28/2006.  Findings: There is transitional anatomy at the lumbosacral junction. The last fully open disc spaces labeled L5-S1.  Images demonstrate pedicle screws stabilization bars with interbody spacer placed at L4-5.  IMPRESSION: L4-5 PLIF.  Original Report Authenticated By: Bernadene Bell. Maricela Curet, M.D.    Assessment/Plan: Mobilize today. Foley out. Doing well.   LOS: 1 day    [name] 02/21/2012, 7:37 AM

## 2012-02-21 NOTE — Progress Notes (Signed)
Occupational Therapy Evaluation Patient Details Name: [name] [name] MRN: [id number] DOB: [birth year] Today's Date: 02/21/2012 Time: 0347-4259 OT Time Calculation (min): 17 min  OT Assessment / Plan / Recommendation Clinical Impression  Pt s/p L4-5 PLIF thus affecting PLOF.  Will benefit from acute OT to address below problem list in prep for d/c home with family.    OT Assessment  Patient needs continued OT Services    Follow Up Recommendations  No OT follow up    Barriers to Discharge      Equipment Recommendations   (need for 3n1 TBD)    Recommendations for Other Services    Frequency  Min 2X/week    Precautions / Restrictions Precautions Precautions: Back Precaution Booklet Issued: Yes (comment) Precaution Comments: Provided patient with handout on back precautions/information and reviewed with patient and his daughter Required Braces or Orthoses: Spinal Brace Spinal Brace: Lumbar corset Restrictions Weight Bearing Restrictions: No   Pertinent Vitals/Pain See vitals    ADL  Lower Body Bathing: Simulated;Min guard Where Assessed - Lower Body Bathing: Unsupported sit to stand Lower Body Dressing: Performed;Min guard Where Assessed - Lower Body Dressing: Sopported sit to stand Toilet Transfer: Buyer, retail Method: Sit to Barista:  (bed) Equipment Used: Back brace;Rolling walker Transfers/Ambulation Related to ADLs: Supervision for sit<>stand from bed ADL Comments: Pt educated on incorporating back precautions into ADL activities.  Pt able to cross ankles over legs to perform LB bathing/dressing.  Pt educated on keeping commonly used items at countertop height to aid in adhering to precautions.  Recommended pt use built in shower seat for showers for energy conservation and to avoid bending to bather lower legs.    OT Diagnosis: Acute pain  OT Problem List: Decreased knowledge of use of DME or AE;Decreased  knowledge of precautions;Pain OT Treatment Interventions: Self-care/ADL training;DME and/or AE instruction;Therapeutic activities;Patient/family education   OT Goals Acute Rehab OT Goals OT Goal Formulation: With patient Time For Goal Achievement: 02/28/12 Potential to Achieve Goals: Good ADL Goals Pt Will Transfer to Toilet: with modified independence;with DME;Ambulation;Comfort height toilet;Maintaining back safety precautions ADL Goal: Toilet Transfer - Progress: Goal set today Pt Will Perform Toileting - Clothing Manipulation: with modified independence;Standing;Sitting on 3-in-1 or toilet ADL Goal: Toileting - Clothing Manipulation - Progress: Goal set today Pt Will Perform Toileting - Hygiene: with modified independence;Sit to stand from 3-in-1/toilet;Standing at 3-in-1/toilet ADL Goal: Toileting - Hygiene - Progress: Goal set today Pt Will Perform Tub/Shower Transfer: Shower transfer;with modified independence;Ambulation;with DME;Shower seat with back;Maintaining back safety precautions ADL Goal: Tub/Shower Transfer - Progress: Goal set today Additional ADL Goal #1: Pt will verbalize and demonstrate 3/3 back precautions during all ADL activity. ADL Goal: Additional Goal #1 - Progress: Goal set today Miscellaneous OT Goals Miscellaneous OT Goal #1: Pt will perform bed mobility with HOB flat with mod I in prep for EOB ADLs. OT Goal: Miscellaneous Goal #1 - Progress: Goal set today  Visit Information  Last OT Received On: 02/21/12    Subjective Data  Subjective: I want to be careful with my back.   Prior Functioning  Home Living Lives With: Spouse;Daughter (grandson) Available Help at Discharge: Family;Available 24 hours/day Type of Home: House (townhouse) Home Access: Stairs to enter [employer] of Steps: 1 Entrance Stairs-Rails: None Home Layout: Two level;Able to live on main level with bedroom/bathroom Bathroom Shower/Tub: Walk-in shower (built in  seat) Firefighter: Standard Bathroom Accessibility: Yes How Accessible: Accessible via walker Home Adaptive Equipment: Straight cane;Hand-held shower  hose Prior Function Level of Independence: Independent with assistive device(s) (uses cane) Able to Take Stairs?: Yes Driving: Yes Vocation: Retired Musician: No difficulties    Cognition  Overall Cognitive Status: Appears within functional limits for tasks assessed/performed Arousal/Alertness: Awake/alert Orientation Level: Oriented X4 / Intact Behavior During Session: WFL for tasks performed    Extremity/Trunk Assessment Right Upper Extremity Assessment RUE ROM/Strength/Tone: [facility] for tasks assessed Left Upper Extremity Assessment LUE ROM/Strength/Tone: WFL for tasks assessed   Mobility Bed Mobility Bed Mobility: Rolling Left;Left Sidelying to Sit;Sitting - Scoot to Edge of Bed;Sit to Sidelying Left Rolling Left: 4: Min guard;With rail Left Sidelying to Sit: 4: Min guard;With rails Sitting - Scoot to Edge of Bed: 5: Supervision Sit to Sidelying Left: 4: Min assist;With rail Details for Bed Mobility Assistance: Moderate verbal and tactile cues for sequencing and proper technique.  Min assist for sit to sidelying to assist legs into bed and support trunk in order to avoid twisting. Transfers Transfers: Sit to Stand;Stand to Sit Sit to Stand: 5: Supervision;With upper extremity assist;From bed Stand to Sit: 5: Supervision;To bed;With upper extremity assist Details for Transfer Assistance: Verbal cueing for safe hand placement   Exercise    Balance    End of Session OT - End of Session Equipment Utilized During Treatment: Gait belt;Back brace Activity Tolerance: Patient tolerated treatment well Patient left: in bed;with call bell/phone within reach;with family/visitor present;with bed alarm set Nurse Communication: Mobility status  02/21/2012 Cipriano Mile OTR/L Pager [phone] Office  [phone]  Cipriano Mile 02/21/2012, 2:33 PM

## 2012-02-21 NOTE — Evaluation (Signed)
Physical Therapy Evaluation Patient Details Name: [name] [name] MRN: [id number] DOB: [birth year] Today's Date: 02/21/2012 Time: 7829-5621 PT Time Calculation (min): 24 min  PT Assessment / Plan / Recommendation Clinical Impression  Patient is an 78 yo male admitted for L4-5 PLIF.  Patient instructed in and donned brace with min assist.  Able to ambulate with RW 150' with min guard assist.  Patient will progress well with mobility. Do not anticipate any f/u PT needs at discharge.  Will determine need for RW closer to discharge.  Will follow patient for mobility, gait, and education on back precautions.    PT Assessment  Patient needs continued PT services    Follow Up Recommendations  No PT follow up;Supervision - Intermittent    Barriers to Discharge None      lEquipment Recommendations   (Need for RW TBD)    Recommendations for Other Services     Frequency Min 5X/week    Precautions / Restrictions Precautions Precautions: Back Precaution Booklet Issued: Yes (comment) Precaution Comments: Provided patient with handout on back precautions/information and reviewed with patient and his daughter Required Braces or Orthoses: Spinal Brace Spinal Brace: Lumbar corset Restrictions Weight Bearing Restrictions: No       Mobility  Bed Mobility Bed Mobility: Rolling Left;Left Sidelying to Sit;Sitting - Scoot to [place] of Bed Rolling Left: 4: Min assist;With rail Left Sidelying to Sit: 4: Min assist;With rails;HOB flat Sitting - Scoot to [place] of Bed: 5: Supervision Details for Bed Mobility Assistance: Verbal and tactile cues to transition using appropriate technique, and to maintain back precautions Transfers Transfers: Sit to Stand;Stand to Sit Sit to Stand: 5: Supervision;With upper extremity assist;From bed Stand to Sit: 5: Supervision;With upper extremity assist;To chair/3-in-1;With armrests Details for Transfer Assistance: Verbal cues for technique and hand  placement Ambulation/Gait Ambulation/Gait Assistance: 4: Min guard Ambulation Distance (Feet): 150 Feet Assistive device: Rolling walker Gait Pattern: Step-through pattern General Gait Details: Cues to stand upright.  Instruction on safe use of RW.    Exercises     PT Diagnosis: Difficulty walking;Acute pain  PT Problem List: Decreased strength;Decreased activity tolerance;Decreased mobility;Decreased knowledge of use of DME;Decreased knowledge of precautions;Pain PT Treatment Interventions: DME instruction;Gait training;Stair training;Functional mobility training;Patient/family education   PT Goals Acute Rehab PT Goals PT Goal Formulation: With patient Time For Goal Achievement: 02/28/12 Potential to Achieve Goals: Good Pt will Roll Supine to Left Side: Independently PT Goal: Rolling Supine to Left Side - Progress: Goal set today Pt will go Supine/Side to Sit: Independently;with HOB 0 degrees PT Goal: Supine/Side to Sit - Progress: Goal set today Pt will go Sit to Supine/Side: Independently;with HOB 0 degrees PT Goal: Sit to Supine/Side - Progress: Goal set today Pt will go Sit to Stand: with modified independence;with upper extremity assist PT Goal: Sit to Stand - Progress: Goal set today Pt will go Stand to Sit: with modified independence;with upper extremity assist PT Goal: Stand to Sit - Progress: Goal set today Pt will Ambulate: >150 feet;with modified independence;with least restrictive assistive device PT Goal: Ambulate - Progress: Goal set today Pt will Go Up / Down Stairs: 1-2 stairs;with supervision;with least restrictive assistive device PT Goal: Up/Down Stairs - Progress: Goal set today Additional Goals Additional Goal #1: Patient will recall 3/3 back precautions and integrate into functional mobility PT Goal: Additional Goal #1 - Progress: Goal set today  Visit Information  Last PT Received On: 02/21/12 Assistance Needed: +1    Subjective Data  Subjective: "Can  I walk  more today?" Patient Stated Goal: To return home   Prior Functioning  Home Living Lives With: Spouse;Daughter (grandson) Available Help at Discharge: Family;Available 24 hours/day Type of Home: House (Townhouse) Home Access: Stairs to enter [employer] of Steps: 1 Entrance Stairs-Rails: None Home Layout: Two level;Able to live on main level with bedroom/bathroom Bathroom Shower/Tub: Walk-in shower (with built-in seat) Bathroom Toilet: Standard Bathroom Accessibility: Yes How Accessible: Accessible via walker Home Adaptive Equipment: Straight cane Prior Function Level of Independence: Independent with assistive device(s) (uses cane) Able to Take Stairs?: Yes Driving: Yes Vocation: Retired Musician: No difficulties    Cognition  Overall Cognitive Status: Appears within functional limits for tasks assessed/performed Arousal/Alertness: Awake/alert Orientation Level: Oriented X4 / Intact Behavior During Session: [facility] for tasks performed    Extremity/Trunk Assessment Right Upper Extremity Assessment RUE ROM/Strength/Tone: [facility] for tasks assessed Left Upper Extremity Assessment LUE ROM/Strength/Tone: WFL for tasks assessed Right Lower Extremity Assessment RLE ROM/Strength/Tone: WFL for tasks assessed RLE Sensation: WFL - Light Touch Left Lower Extremity Assessment LLE ROM/Strength/Tone: WFL for tasks assessed LLE Sensation: WFL - Light Touch   Balance    End of Session PT - End of Session Equipment Utilized During Treatment: Back brace;Gait belt Activity Tolerance: Patient tolerated treatment well Patient left: in chair;with call bell/phone within reach;with family/visitor present Nurse Communication: Mobility status   Vena Austria 02/21/2012, 9:45 AM Durenda Hurt. Renaldo Fiddler, [facility] Acute Rehab Services Pager [phone]

## 2012-02-21 NOTE — Care Management Note (Signed)
    Page 1 of 1   02/21/2012     4:08:46 PM   CARE MANAGEMENT NOTE 02/21/2012  Patient:  [name] [name], [name] [name]   Account Number:  [id number]  Date Initiated:  02/21/2012  Documentation initiated by:  Onnie Boer  Subjective/Objective Assessment:   PT WAS ADMITTED FOR SURGERY     Action/Plan:   PROGRESSION OF CARE AND DISCHARGE PLANNING   Anticipated DC Date:  02/23/2012   Anticipated DC Plan:  HOME/SELF CARE      DC Planning Services  CM consult      Choice offered to / List presented to:  C-1 Patient      DME agency  [facility]        Status of service:  In process, will continue to follow Medicare Important Message given?   (If response is "NO", the following Medicare IM given date fields will be blank) Date Medicare IM given:   Date Additional Medicare IM given:    Discharge Disposition:    Per UR Regulation:  Reviewed for med. necessity/level of care/duration of stay  If discussed at Long Length of Stay Meetings, dates discussed:    Comments:  02/21/12 Onnie Boer, RN, BSN PT WAS ADMITTED FOR AN L4-5 PLIF.  PTA PT WAS AT HOME WITH HIS WIFE.  PT PLANS TO RETURN TO HOME.  PHYSICAL THERAPY RECOMMENDED A RW.  PT HAS CHOSEN [facility].  WILL F/U ONCE ORDER IS RECEIVED.

## 2012-02-22 LAB — GLUCOSE, CAPILLARY: Glucose-Capillary: 105 mg/dL — ABNORMAL HIGH (ref 70–99)

## 2012-02-22 MED ORDER — CYCLOBENZAPRINE HCL 10 MG PO TABS: 10.0000 mg | ORAL_TABLET | Freq: Three times a day (TID) | ORAL | Status: AC | PRN

## 2012-02-22 MED ORDER — OXYCODONE-ACETAMINOPHEN 5-325 MG PO TABS: 1.0000 | ORAL_TABLET | ORAL | Status: AC | PRN

## 2012-02-22 NOTE — Discharge Summary (Signed)
  Physician Discharge Summary  Patient ID: [name] [name] MRN: [id number] DOB/AGE: October 10, 1928 78 y.o.  Admit date: 02/20/2012 Discharge date: 02/22/2012  Admission Diagnoses: Lumbar spondylosis with stenosis L4-5  Discharge Diagnoses: Same Active Problems:  * No active hospital problems. *    Discharged Condition: good  Hospital Course: Patient was admitted hospital underwent an L4-5 fusion postop patient did very well with recovered in the floor on the floor was convalescing well and living and voiding spontaneously tolerating regular diet was a be discharged home on postop day 2.  Consults: Significant Diagnostic Studies: Treatments: L4-5 posterior lumbar interbody fusion Discharge Exam: Blood pressure 155/75, pulse 61, temperature 98.3 F (36.8 C), temperature source Oral, resp. rate 18, height 5\' 11"  (1.803 m), weight 95.255 kg (210 lb), SpO2 98.00%. Strength out of 5 wound clean and dry  Disposition: Home  Discharge Orders    Future Appointments: Provider: Department: Dept Phone: Center:   04/09/2012 9:30 AM [facility] [phone] [facility]   04/16/2012 10:30 AM Joaquim Nam, MD [facility] [phone] [facility]     Medication List  As of 02/22/2012  8:24 AM   TAKE these medications         allopurinol 300 MG tablet   Commonly known as: ZYLOPRIM   Take 1 tablet (300 mg total) by mouth daily.      aspirin 81 MG tablet   Take 81 mg by mouth daily.      atorvastatin 40 MG tablet   Commonly known as: LIPITOR   Take 1 tablet (40 mg total) by mouth daily.      carvedilol 20 MG 24 hr capsule   Commonly known as: COREG CR   Take 1 capsule (20 mg total) by mouth daily.      cyclobenzaprine 10 MG tablet   Commonly known as: FLEXERIL   Take 1 tablet (10 mg total) by mouth 3 (three) times daily as needed for muscle spasms.      esomeprazole 40 MG capsule   Commonly known as: NEXIUM   Take 1 capsule (40 mg total) by mouth daily before  breakfast.      lisinopril 20 MG tablet   Commonly known as: PRINIVIL,ZESTRIL   Take 1 tablet (20 mg total) by mouth daily.      oxyCODONE-acetaminophen 5-325 MG per tablet   Commonly known as: PERCOCET   Take 1-2 tablets by mouth every 4 (four) hours as needed.      oxyCODONE-acetaminophen 5-325 MG per tablet   Commonly known as: PERCOCET   Take 1 tablet by mouth every 4 (four) hours as needed. For pain      pioglitazone 15 MG tablet   Commonly known as: ACTOS   Take 1 tablet (15 mg total) by mouth daily.      primidone 50 MG tablet   Commonly known as: MYSOLINE   Take 0.5 tablets (25 mg total) by mouth daily.      rosuvastatin 20 MG tablet   Commonly known as: CRESTOR   Take 20 mg by mouth daily.             Signed: Trenell Moxey P 02/22/2012, 8:24 AM

## 2012-02-22 NOTE — Progress Notes (Signed)
   CARE MANAGEMENT NOTE 02/22/2012  Patient:  [name], [name]   Account Number:  [id number]  Date Initiated:  02/21/2012  Documentation initiated by:  Onnie Boer  Subjective/Objective Assessment:   PT WAS ADMITTED FOR SURGERY     Action/Plan:   PROGRESSION OF CARE AND DISCHARGE PLANNING   Anticipated DC Date:  02/23/2012   Anticipated DC Plan:  HOME/SELF CARE      DC Planning Services  CM consult      Choice offered to / List presented to:  C-1 Patient   DME arranged  3-N-1  Levan Hurst      DME agency  [facility]     HH arranged  HH-2 PT      [facility] agency  [facility]   Status of service:  Completed, signed off Medicare Important Message given?   (If response is "NO", the following Medicare IM given date fields will be blank) Date Medicare IM given:   Date Additional Medicare IM given:    Discharge Disposition:  HOME W HOME HEALTH SERVICES  Per UR Regulation:  Reviewed for med. necessity/level of care/duration of stay  If discussed at Long Length of Stay Meetings, dates discussed:    Comments:  02/22/2012 1200 Contacted [facility] for DME and HH for scheduled d/c today. [name] Donning RN CCM Case Mgmt phone [phone]  02/21/12 Onnie Boer, RN, BSN PT WAS ADMITTED FOR AN L4-5 PLIF.  PTA PT WAS AT HOME WITH HIS WIFE.  PT PLANS TO RETURN TO HOME.  PHYSICAL THERAPY RECOMMENDED A RW.  PT HAS CHOSEN [facility].  WILL F/U ONCE ORDER IS RECEIVED.

## 2012-02-22 NOTE — Progress Notes (Signed)
Subjective: Patient reports Is feeling great no leg pain no back pain ambulating and voiding spontaneously ready for discharge  Objective: Vital signs in last 24 hours: Temp:  [97.3 F (36.3 C)-98.3 F (36.8 C)] 98.3 F (36.8 C) (06/15 0526) Pulse Rate:  [57-74] 61  (06/15 0526) Resp:  [18-20] 18  (06/15 0526) BP: (128-155)/(65-75) 155/75 mmHg (06/15 0526) SpO2:  [96 %-99 %] 98 % (06/15 0526)  Intake/Output from previous day: 06/14 0701 - 06/15 0700 In: 1470 [P.O.:1320] Out: 702 [Urine:702] Intake/Output this shift:    Strength out of 5 wound clean and dry  Lab Results: No results found for this basename: WBC:2,HGB:2,HCT:2,PLT:2 in the last 72 hours BMET No results found for this basename: NA:2,K:2,CL:2,CO2:2,GLUCOSE:2,BUN:2,CREATININE:2,CALCIUM:2 in the last 72 hours  Studies/Results: Dg Lumbar Spine 2-3 Views  02/20/2012  *RADIOLOGY REPORT*  Clinical Data: L4-5 PLIF  DG C-ARM 61-120 MIN,LUMBAR SPINE - 2-3 VIEW  Technique: Two fluoroscopic intraoperative spot views of the lower lumbar spine provided.  Comparison:  CT abdomen and pelvis 01/28/2006.  Findings: There is transitional anatomy at the lumbosacral junction. The last fully open disc spaces labeled L5-S1.  Images demonstrate pedicle screws stabilization bars with interbody spacer placed at L4-5.  IMPRESSION: L4-5 PLIF.  Original Report Authenticated By: Bernadene Bell. D'ALESSIO, M.D.   Dg C-arm 61-120 Min  02/20/2012  *RADIOLOGY REPORT*  Clinical Data: L4-5 PLIF  DG C-ARM 61-120 MIN,LUMBAR SPINE - 2-3 VIEW  Technique: Two fluoroscopic intraoperative spot views of the lower lumbar spine provided.  Comparison:  CT abdomen and pelvis 01/28/2006.  Findings: There is transitional anatomy at the lumbosacral junction. The last fully open disc spaces labeled L5-S1.  Images demonstrate pedicle screws stabilization bars with interbody spacer placed at L4-5.  IMPRESSION: L4-5 PLIF.  Original Report Authenticated By: Bernadene Bell. Maricela Curet, M.D.     Assessment/Plan: Posterior day 2 from a pleasant will be discharged home.  LOS: 2 days     Bessie Boyte P 02/22/2012, 8:22 AM

## 2012-02-22 NOTE — Discharge Instructions (Signed)
No lifting no bending no twisting no driving a riding a car when she stomach were to see Dr. Yetta Barre.

## 2012-02-22 NOTE — Progress Notes (Signed)
Occupational Therapy Treatment Patient Details Name: [name] [name] MRN: [id number] DOB: 06/25/1929 Today's Date: 02/22/2012 Time: 7106-2694 OT Time Calculation (min): 27 min  OT Assessment / Plan / Recommendation Comments on Treatment Session Pt ready for d/c, but noting some impulsivity and decreased generalization of back precautions and safety.  Recommending HHOT.  Instructed pt and daughter in benefits of home health therapy visits.    Follow Up Recommendations  Home health OT;Supervision/Assistance - 24 hour    Barriers to Discharge       Equipment Recommendations  Rolling walker with 5" wheels;3 in 1 bedside comode    Recommendations for Other Services    Frequency     Plan Discharge plan needs to be updated    Precautions / Restrictions Precautions Precautions: Back Precaution Booklet Issued: Yes (comment) Precaution Comments: pt verbalized back precautions, but needs reinforcement to follow. Required Braces or Orthoses: Spinal Brace Spinal Brace: Lumbar corset Restrictions Weight Bearing Restrictions: No   Pertinent Vitals/Pain     ADL  Grooming: Performed;Wash/dry hands;Supervision/safety Where Assessed - Grooming: Supported standing Lower Body Bathing: Simulated;Supervision/safety Where Assessed - Lower Body Bathing: Supported sit to stand Lower Body Dressing: Performed;Supervision/safety (instructed in use of AE with daughter observing) Where Assessed - Lower Body Dressing: Sopported sit to stand Toilet Transfer: Research scientist (life sciences) Method: Sit to Barista: Raised toilet seat with arms (or 3-in-1 over toilet) Toileting - Clothing Manipulation and Hygiene: Simulated;Supervision/safety;Other (comment) (instructed in hygiene without twisting, toilet aide) Where Assessed - Toileting Clothing Manipulation and Hygiene: Sit to stand from 3-in-1 or toilet Tub/Shower Transfer: Performed;Supervision/safety Tub/Shower  Transfer Method: Ambulating Equipment Used: Back brace;Rolling walker ADL Comments: Instructed in common activities in which pt's frequently do not follow precautions with daughter participating in conversation.  ADL equipment instructed in presence of pt's daughter.    OT Diagnosis:    OT Problem List:   OT Treatment Interventions:     OT Goals ADL Goals Pt Will Transfer to Toilet: with modified independence;with DME;Ambulation;Comfort height toilet;Maintaining back safety precautions ADL Goal: Toilet Transfer - Progress: Progressing toward goals Pt Will Perform Toileting - Clothing Manipulation: with modified independence;Standing;Sitting on 3-in-1 or toilet ADL Goal: Toileting - Clothing Manipulation - Progress: Progressing toward goals Pt Will Perform Toileting - Hygiene: with modified independence;Sit to stand from 3-in-1/toilet;Standing at 3-in-1/toilet ADL Goal: Toileting - Hygiene - Progress: Progressing toward goals Pt Will Perform Tub/Shower Transfer: Shower transfer;with modified independence;Ambulation;with DME;Shower seat with back;Maintaining back safety precautions ADL Goal: Web designer - Progress: Progressing toward goals Additional ADL Goal #1: Pt will verbalize and demonstrate 3/3 back precautions during all ADL activity. ADL Goal: Additional Goal #1 - Progress: Progressing toward goals  Visit Information  Last OT Received On: 02/22/12 Assistance Needed: +1    Subjective Data      Prior Functioning       Cognition  Overall Cognitive Status: Appears within functional limits for tasks assessed/performed (pt with some impulsivity) Arousal/Alertness: Awake/alert Orientation Level: Oriented X4 / Intact Behavior During Session: Other (comment) (some impulsivity noted)    Mobility Bed Mobility Bed Mobility: Rolling Left;Left Sidelying to Sit;Sitting - Scoot to Edge of Bed Rolling Left: 5: Supervision Left Sidelying to Sit: 5: Supervision Sitting - Scoot to  Edge of Bed: 5: Supervision Details for Bed Mobility Assistance: cues for log roll.  pt attempted to sit straight up until PT cued to do log roll.   Transfers Sit to Stand: 5: Supervision;With upper extremity assist;From bed;From toilet Stand to Sit:  5: Supervision;With upper extremity assist;With armrests;To chair/3-in-1;To toilet Details for Transfer Assistance: cues for safe use of UEs   Exercises    Balance Balance Balance Assessed: No  End of Session OT - End of Session Equipment Utilized During Treatment: Gait belt;Back brace Activity Tolerance: Patient tolerated treatment well Patient left: in chair;with family/visitor present;with call bell/phone within reach   Evern Bio 02/22/2012, 12:48 PM [phone]

## 2012-02-22 NOTE — Progress Notes (Signed)
Physical Therapy Treatment Patient Details Name: [name] [name] MRN: [id number] DOB: 1929/06/16 Today's Date: 02/22/2012 Time: 4098-1191 PT Time Calculation (min): 20 min  PT Assessment / Plan / Recommendation Comments on Treatment Session  pt presents with L4-5 PLIF.  pt needs consistent cueing for following back precautions and safety.  pt incontinent on way to bathroom limiting ambulation and needing further cues for back precautions during hygiene.  pt will need HHPT to further educate on back precautions and home safety.      Follow Up Recommendations  Home health PT;Supervision/Assistance - 24 hour    Barriers to Discharge        Equipment Recommendations  Rolling walker with 5" wheels;3 in 1 bedside comode    Recommendations for Other Services    Frequency Min 5X/week   Plan Discharge plan needs to be updated;Frequency remains appropriate    Precautions / Restrictions Precautions Precautions: Back Precaution Booklet Issued: Yes (comment) Precaution Comments: pt verbalized back precautions, but needs reinforcement to follow. Required Braces or Orthoses: Spinal Brace Spinal Brace: Lumbar corset Restrictions Weight Bearing Restrictions: No   Pertinent Vitals/Pain 5/10 in low back.  RN made aware, however pt declined pain meds.      Mobility  Bed Mobility Bed Mobility: Rolling Left;Left Sidelying to Sit;Sitting - Scoot to Edge of Bed Rolling Left: 5: Supervision Left Sidelying to Sit: 5: Supervision Sitting - Scoot to Edge of Bed: 5: Supervision Details for Bed Mobility Assistance: cues for log roll.  pt attempted to sit straight up until PT cued to do log roll.   Transfers Transfers: Sit to Stand;Stand to Sit Sit to Stand: 5: Supervision;With upper extremity assist;From bed;From toilet Stand to Sit: 5: Supervision;With upper extremity assist;With armrests;To chair/3-in-1;To toilet Details for Transfer Assistance: cues for safe use of  UEs Ambulation/Gait Ambulation/Gait Assistance: 4: Min guard Ambulation Distance (Feet): 20 Feet (x2) Assistive device: Rolling walker Ambulation/Gait Assistance Details: cues throughout mobility for back precautions, safe use of RW.  pt verbalizes precautions, but does not follow.   Gait Pattern: Step-through pattern Stairs: No Wheelchair Mobility Wheelchair Mobility: No    Exercises     PT Diagnosis:    PT Problem List:   PT Treatment Interventions:     PT Goals Acute Rehab PT Goals Time For Goal Achievement: 02/28/12 PT Goal: Rolling Supine to Left Side - Progress: Progressing toward goal PT Goal: Supine/Side to Sit - Progress: Progressing toward goal PT Goal: Sit to Stand - Progress: Progressing toward goal PT Goal: Stand to Sit - Progress: Progressing toward goal PT Goal: Ambulate - Progress: Progressing toward goal Additional Goals PT Goal: Additional Goal #1 - Progress: Progressing toward goal  Visit Information  Last PT Received On: 02/22/12 Assistance Needed: +1    Subjective Data      Cognition  Overall Cognitive Status: Appears within functional limits for tasks assessed/performed Arousal/Alertness: Awake/alert Orientation Level: Oriented X4 / Intact Behavior During Session: [facility] for tasks performed    Balance  Balance Balance Assessed: No  End of Session PT - End of Session Equipment Utilized During Treatment: Back brace;Gait belt Activity Tolerance: Patient tolerated treatment well Patient left: in chair;with call bell/phone within reach Nurse Communication: Mobility status    Sunny Schlein, [place] [phone] 02/22/2012, 10:06 AM

## 2012-03-27 DIAGNOSIS — M4310 Spondylolisthesis, site unspecified: Secondary | ICD-10-CM | POA: Diagnosis not present

## 2012-04-09 ENCOUNTER — Other Ambulatory Visit (INDEPENDENT_AMBULATORY_CARE_PROVIDER_SITE_OTHER): Payer: Medicare Other

## 2012-04-09 DIAGNOSIS — E119 Type 2 diabetes mellitus without complications: Secondary | ICD-10-CM

## 2012-04-09 LAB — HEMOGLOBIN A1C: Hgb A1c MFr Bld: 5.9 % (ref 4.6–6.5)

## 2012-04-16 ENCOUNTER — Ambulatory Visit (INDEPENDENT_AMBULATORY_CARE_PROVIDER_SITE_OTHER): Payer: Medicare Other | Admitting: Family Medicine

## 2012-04-16 ENCOUNTER — Encounter: Payer: Self-pay | Admitting: Family Medicine

## 2012-04-16 VITALS — BP 154/104 | HR 81 | Temp 98.3°F | Wt 194.0 lb

## 2012-04-16 DIAGNOSIS — E119 Type 2 diabetes mellitus without complications: Secondary | ICD-10-CM | POA: Diagnosis not present

## 2012-04-16 NOTE — Patient Instructions (Addendum)
Stop the actos/pioglitazone in the meantime.   If your sugar is greatly elevated, notify me and restart the actos. Recheck A1c and other labs in 6 months before a 30 min visit with Para March.

## 2012-04-16 NOTE — Progress Notes (Signed)
Other than some fatigue, he is feeling much better after back surgery.  No pain with walking now.  Intermittently wearing a back brace.    Diabetes:  Using medications without difficulties:yes Hypoglycemic episodes:no Hyperglycemic episodes: no Feet problems:no Blood Sugars averaging: 90-120s We talked about labs and options.  Will stop the actos for now.    BP has been elevated at the OVs prev.  He can check BP at home and notify me if elevated.  He agrees with that plan.    Tremor at baseline per patient.   Meds, vitals, and allergies reviewed.   ROS: See HPI.  Otherwise negative.    GEN: nad, alert and oriented HEENT: mucous membranes moist NECK: supple w/o LA CV: rrr. PULM: ctab, no inc wob EXT: no edema SKIN: no acute rash Tremor noted B In back brace   Diabetic foot exam: Normal inspection No skin breakdown No calluses  Normal DP pulses Normal sensation to light touch and monofilament Nails normal

## 2012-04-17 NOTE — Assessment & Plan Note (Signed)
A1c improved to the point of needing to come off actos. Will recheck A1c later as scheduled, if sugar greatly elevated he'll restart actos and notify me.  He agrees.  He'll monitor BP in meantime.

## 2012-05-25 DIAGNOSIS — M4310 Spondylolisthesis, site unspecified: Secondary | ICD-10-CM | POA: Diagnosis not present

## 2012-06-23 ENCOUNTER — Ambulatory Visit (INDEPENDENT_AMBULATORY_CARE_PROVIDER_SITE_OTHER): Payer: Medicare Other

## 2012-06-23 DIAGNOSIS — Z23 Encounter for immunization: Secondary | ICD-10-CM

## 2012-09-16 ENCOUNTER — Other Ambulatory Visit: Payer: Self-pay | Admitting: *Deleted

## 2012-09-16 MED ORDER — CARVEDILOL PHOSPHATE ER 20 MG PO CP24: 20.0000 mg | ORAL_CAPSULE | Freq: Every day | ORAL | Status: DC

## 2012-10-08 ENCOUNTER — Other Ambulatory Visit: Payer: Self-pay | Admitting: Family Medicine

## 2012-10-08 MED ORDER — LISINOPRIL 20 MG PO TABS: 20.0000 mg | ORAL_TABLET | Freq: Every day | ORAL | Status: DC

## 2012-10-08 NOTE — Telephone Encounter (Signed)
According to the last OV note, the patient was stopping the Actos but his mail order pharmacy is now asking for RF.  Please advise.

## 2012-10-08 NOTE — Telephone Encounter (Signed)
If he's been off actos (and it appears that he has), then I would hold off on the actos until he comes back in for the next OV.  Others are sent.  Please notify pt.  Thanks.

## 2012-10-08 NOTE — Telephone Encounter (Signed)
Patient notified.  He is not taking Actos but did request a RF on his Lisinopril.  That Rx was sent electronically.

## 2012-10-12 ENCOUNTER — Other Ambulatory Visit: Payer: Self-pay | Admitting: Family Medicine

## 2012-10-14 ENCOUNTER — Other Ambulatory Visit (INDEPENDENT_AMBULATORY_CARE_PROVIDER_SITE_OTHER): Payer: Medicare Other

## 2012-10-14 DIAGNOSIS — E119 Type 2 diabetes mellitus without complications: Secondary | ICD-10-CM

## 2012-10-14 LAB — COMPREHENSIVE METABOLIC PANEL
ALT: 16 U/L (ref 0–53)
Albumin: 3.6 g/dL (ref 3.5–5.2)
CO2: 27 mEq/L (ref 19–32)
Calcium: 9.4 mg/dL (ref 8.4–10.5)
Chloride: 107 mEq/L (ref 96–112)
GFR: 62.25 mL/min (ref >60.00)
Sodium: 139 mEq/L (ref 135–145)
Total Protein: 7 g/dL (ref 6.0–8.3)

## 2012-10-14 LAB — LIPID PANEL: Total CHOL/HDL Ratio: 4

## 2012-10-15 ENCOUNTER — Other Ambulatory Visit: Payer: Medicare Other

## 2012-10-19 ENCOUNTER — Ambulatory Visit (INDEPENDENT_AMBULATORY_CARE_PROVIDER_SITE_OTHER): Payer: Medicare Other | Admitting: Family Medicine

## 2012-10-19 ENCOUNTER — Encounter: Payer: Self-pay | Admitting: Family Medicine

## 2012-10-19 VITALS — BP 152/100 | HR 66 | Temp 97.4°F | Wt 204.8 lb

## 2012-10-19 DIAGNOSIS — G250 Essential tremor: Secondary | ICD-10-CM

## 2012-10-19 DIAGNOSIS — E785 Hyperlipidemia, unspecified: Secondary | ICD-10-CM

## 2012-10-19 DIAGNOSIS — Z8546 Personal history of malignant neoplasm of prostate: Secondary | ICD-10-CM | POA: Diagnosis not present

## 2012-10-19 DIAGNOSIS — G252 Other specified forms of tremor: Secondary | ICD-10-CM | POA: Diagnosis not present

## 2012-10-19 DIAGNOSIS — E119 Type 2 diabetes mellitus without complications: Secondary | ICD-10-CM

## 2012-10-19 DIAGNOSIS — C61 Malignant neoplasm of prostate: Secondary | ICD-10-CM | POA: Diagnosis not present

## 2012-10-19 DIAGNOSIS — I10 Essential (primary) hypertension: Secondary | ICD-10-CM

## 2012-10-19 NOTE — Patient Instructions (Signed)
Go to the lab on the way out.  We'll contact you with your lab report.  Take care.  Recheck in 6 months. Labs ahead of time.  Glad to see you.

## 2012-10-19 NOTE — Progress Notes (Signed)
Diabetes:  No meds Hypoglycemic episodes: no sx Hyperglycemic episodes: no sx Feet problems: no  Blood Sugars averaging: ~120 or lower  Hypertension:    Using medication without problems or lightheadedness: yes Chest pain with exertion:no Edema:no Short of breath:no Average home BPs: SBP 130/80s  Elevated Cholesterol: Using medications without problems:yes Muscle aches: no Diet compliance: discussed Exercise: walking.   Prostate cancer, due for repeat PSA (repeat PSA w/o sig change, see notes on labs). Had seen Dr. Patsi Sears last year.  No LUTS.   Tremor is at baseline.  No ADE from from meds.  Tremor is no more bothersome than it prev was.   PMH and SH reviewed.   Vital signs, Meds and allergies reviewed.  ROS: See HPI.  Otherwise nontributory.   GEN: nad, alert and oriented HEENT: mucous membranes moist NECK: supple w/o LA CV: rrr.  no murmur PULM: ctab, no inc wob ABD: soft, +bs EXT: no edema SKIN: no acute rash B hand tremor, episodic.  Rare jaw tremor note.  No foot tremor.    Diabetic foot exam: Normal inspection No skin breakdown No calluses  Normal DP pulses Normal sensation to light tough and monofilament Nails normal

## 2012-10-20 ENCOUNTER — Encounter: Payer: Self-pay | Admitting: Family Medicine

## 2012-10-20 ENCOUNTER — Encounter: Payer: Self-pay | Admitting: *Deleted

## 2012-10-20 NOTE — Assessment & Plan Note (Signed)
Tremor is at baseline.  No ADE from from meds.  Tremor is no more bothersome than it prev was.  Continue current meds.

## 2012-10-20 NOTE — Assessment & Plan Note (Signed)
Controlled on home checks, continue as is.  Labs d/w pt.

## 2012-10-20 NOTE — Assessment & Plan Note (Signed)
Controlled, continue as is.  Labs d/w pt.  

## 2012-10-20 NOTE — Assessment & Plan Note (Addendum)
PSA unchanged.  Will notify uro.  No other f/u needed for now.  Can recheck in 1 year.

## 2013-01-25 ENCOUNTER — Telehealth: Payer: Self-pay

## 2013-01-25 NOTE — Telephone Encounter (Signed)
Spoke with p[facility] for [facility]. [name] will ck with [facility].

## 2013-03-18 ENCOUNTER — Other Ambulatory Visit: Payer: Self-pay

## 2013-03-29 DIAGNOSIS — L578 Other skin changes due to chronic exposure to nonionizing radiation: Secondary | ICD-10-CM | POA: Diagnosis not present

## 2013-03-29 DIAGNOSIS — L570 Actinic keratosis: Secondary | ICD-10-CM | POA: Diagnosis not present

## 2013-03-29 DIAGNOSIS — L821 Other seborrheic keratosis: Secondary | ICD-10-CM | POA: Diagnosis not present

## 2013-03-29 DIAGNOSIS — D1800 Hemangioma unspecified site: Secondary | ICD-10-CM | POA: Diagnosis not present

## 2013-03-29 DIAGNOSIS — L820 Inflamed seborrheic keratosis: Secondary | ICD-10-CM | POA: Diagnosis not present

## 2013-03-30 DIAGNOSIS — E663 Overweight: Secondary | ICD-10-CM | POA: Diagnosis not present

## 2013-03-30 DIAGNOSIS — M549 Dorsalgia, unspecified: Secondary | ICD-10-CM | POA: Diagnosis not present

## 2013-03-31 ENCOUNTER — Other Ambulatory Visit: Payer: Self-pay | Admitting: Family Medicine

## 2013-03-31 DIAGNOSIS — M109 Gout, unspecified: Secondary | ICD-10-CM

## 2013-03-31 DIAGNOSIS — E119 Type 2 diabetes mellitus without complications: Secondary | ICD-10-CM

## 2013-04-09 ENCOUNTER — Other Ambulatory Visit: Payer: Medicare Other

## 2013-04-12 ENCOUNTER — Other Ambulatory Visit: Payer: Medicare Other

## 2013-04-14 ENCOUNTER — Other Ambulatory Visit (INDEPENDENT_AMBULATORY_CARE_PROVIDER_SITE_OTHER): Payer: Medicare Other

## 2013-04-14 DIAGNOSIS — E119 Type 2 diabetes mellitus without complications: Secondary | ICD-10-CM

## 2013-04-14 DIAGNOSIS — M109 Gout, unspecified: Secondary | ICD-10-CM

## 2013-04-14 DIAGNOSIS — E039 Hypothyroidism, unspecified: Secondary | ICD-10-CM | POA: Diagnosis not present

## 2013-04-19 ENCOUNTER — Ambulatory Visit (INDEPENDENT_AMBULATORY_CARE_PROVIDER_SITE_OTHER): Payer: Medicare Other | Admitting: Family Medicine

## 2013-04-19 ENCOUNTER — Encounter: Payer: Self-pay | Admitting: Family Medicine

## 2013-04-19 VITALS — BP 160/80 | HR 76 | Temp 98.2°F | Wt 202.5 lb

## 2013-04-19 DIAGNOSIS — E039 Hypothyroidism, unspecified: Secondary | ICD-10-CM | POA: Diagnosis not present

## 2013-04-19 DIAGNOSIS — I10 Essential (primary) hypertension: Secondary | ICD-10-CM | POA: Diagnosis not present

## 2013-04-19 DIAGNOSIS — G250 Essential tremor: Secondary | ICD-10-CM | POA: Diagnosis not present

## 2013-04-19 DIAGNOSIS — M109 Gout, unspecified: Secondary | ICD-10-CM | POA: Diagnosis not present

## 2013-04-19 DIAGNOSIS — G252 Other specified forms of tremor: Secondary | ICD-10-CM

## 2013-04-19 DIAGNOSIS — E119 Type 2 diabetes mellitus without complications: Secondary | ICD-10-CM

## 2013-04-19 NOTE — Assessment & Plan Note (Signed)
Controlled on home checks, not SOB, no BLE edema, no CP.

## 2013-04-19 NOTE — Assessment & Plan Note (Signed)
Urate not elevated, continue allopurinol. Rare flares.

## 2013-04-19 NOTE — Assessment & Plan Note (Signed)
No meds, controlled, continue as is.  He agrees. Labs d/w pt.

## 2013-04-19 NOTE — Assessment & Plan Note (Signed)
He'll notify me if worsening.

## 2013-04-19 NOTE — Patient Instructions (Addendum)
Check with your insurance to see if they will cover the shingles shot. I would get a flu shot each fall.   Recheck in about 6 months at a physical. Labs ahead of time.  Call about an eye exam.

## 2013-04-19 NOTE — Progress Notes (Signed)
Tremor- present for decades, not worse.  Offered neuro referral and he'll consider if worse.  Minimal sx in the jaw.  Noted L>R hand.    Diabetes:  No meds Hypoglycemic episodes:no Hyperglycemic episodes:no Feet problems:no Blood Sugars averaging: ~120s eye exam within last year: due, he'll call about that.  Labs d/w pt.   Abnormal TSH, wnl now.  No neck mass.  BP has been controlled on home checks  Gout. Uric acid wnl, no flares.  Doing well with meds.  Labs d/w pt.    PMH and SH reviewed  Meds, vitals, and allergies reviewed.   ROS: See HPI.  Otherwise negative.    GEN: nad, alert and oriented HEENT: mucous membranes moist NECK: supple w/o LA, no TMG CV: rrr. PULM: ctab, no inc wob ABD: soft, +bs EXT: no edema SKIN: no acute rash  Diabetic foot exam: Normal inspection No skin breakdown No calluses  Normal DP pulses Normal sensation to light touch and monofilament Nails thickened

## 2013-04-19 NOTE — Assessment & Plan Note (Signed)
TSH wnl, we can recheck periodically. D/w pt.

## 2013-04-27 DIAGNOSIS — H524 Presbyopia: Secondary | ICD-10-CM | POA: Diagnosis not present

## 2013-04-27 DIAGNOSIS — E119 Type 2 diabetes mellitus without complications: Secondary | ICD-10-CM | POA: Diagnosis not present

## 2013-04-27 DIAGNOSIS — H2510 Age-related nuclear cataract, unspecified eye: Secondary | ICD-10-CM | POA: Diagnosis not present

## 2013-05-25 ENCOUNTER — Other Ambulatory Visit: Payer: Self-pay | Admitting: Family Medicine

## 2013-06-01 ENCOUNTER — Encounter: Payer: Self-pay | Admitting: Family Medicine

## 2013-06-22 ENCOUNTER — Ambulatory Visit (INDEPENDENT_AMBULATORY_CARE_PROVIDER_SITE_OTHER): Payer: Medicare Other

## 2013-06-22 DIAGNOSIS — Z23 Encounter for immunization: Secondary | ICD-10-CM

## 2013-06-28 DIAGNOSIS — M549 Dorsalgia, unspecified: Secondary | ICD-10-CM | POA: Diagnosis not present

## 2013-06-29 ENCOUNTER — Other Ambulatory Visit: Payer: Self-pay | Admitting: Neurological Surgery

## 2013-06-29 DIAGNOSIS — L821 Other seborrheic keratosis: Secondary | ICD-10-CM | POA: Diagnosis not present

## 2013-06-29 DIAGNOSIS — L570 Actinic keratosis: Secondary | ICD-10-CM | POA: Diagnosis not present

## 2013-06-29 DIAGNOSIS — M549 Dorsalgia, unspecified: Secondary | ICD-10-CM

## 2013-06-29 DIAGNOSIS — D1800 Hemangioma unspecified site: Secondary | ICD-10-CM | POA: Diagnosis not present

## 2013-06-29 DIAGNOSIS — L820 Inflamed seborrheic keratosis: Secondary | ICD-10-CM | POA: Diagnosis not present

## 2013-06-29 DIAGNOSIS — L578 Other skin changes due to chronic exposure to nonionizing radiation: Secondary | ICD-10-CM | POA: Diagnosis not present

## 2013-07-09 DIAGNOSIS — E119 Type 2 diabetes mellitus without complications: Secondary | ICD-10-CM | POA: Diagnosis not present

## 2013-07-09 DIAGNOSIS — Z961 Presence of intraocular lens: Secondary | ICD-10-CM | POA: Diagnosis not present

## 2013-07-09 DIAGNOSIS — H2510 Age-related nuclear cataract, unspecified eye: Secondary | ICD-10-CM | POA: Diagnosis not present

## 2013-07-09 DIAGNOSIS — H18419 Arcus senilis, unspecified eye: Secondary | ICD-10-CM | POA: Diagnosis not present

## 2013-07-12 ENCOUNTER — Ambulatory Visit
Admission: RE | Admit: 2013-07-12 | Discharge: 2013-07-12 | Disposition: A | Discharge status: Home / Self Care | Payer: Medicare Other | Source: Ambulatory Visit | Attending: Neurological Surgery | Admitting: Neurological Surgery

## 2013-07-12 DIAGNOSIS — M549 Dorsalgia, unspecified: Secondary | ICD-10-CM

## 2013-07-12 DIAGNOSIS — M48061 Spinal stenosis, lumbar region without neurogenic claudication: Secondary | ICD-10-CM | POA: Diagnosis not present

## 2013-07-19 ENCOUNTER — Telehealth: Payer: Self-pay | Admitting: *Deleted

## 2013-07-19 NOTE — Telephone Encounter (Signed)
PT  OVERDUE  FOR AN APPT LMTCB   NEEDS  CLEARANCE  FOR  CATARACT SURGERY .[name] [name]

## 2013-07-20 DIAGNOSIS — M549 Dorsalgia, unspecified: Secondary | ICD-10-CM | POA: Diagnosis not present

## 2013-07-28 NOTE — Telephone Encounter (Signed)
PT  HAS APPT  08-03-13  AT  2:45 PM./CY

## 2013-07-30 ENCOUNTER — Other Ambulatory Visit: Payer: Self-pay | Admitting: Family Medicine

## 2013-07-30 NOTE — Telephone Encounter (Signed)
Electronic refill request. Please advise. I wasn't sure if I could refill Primidone.

## 2013-08-01 NOTE — Telephone Encounter (Signed)
Sent!

## 2013-08-03 ENCOUNTER — Ambulatory Visit (INDEPENDENT_AMBULATORY_CARE_PROVIDER_SITE_OTHER): Payer: Medicare Other | Admitting: Cardiovascular Disease

## 2013-08-03 ENCOUNTER — Encounter: Payer: Self-pay | Admitting: Cardiovascular Disease

## 2013-08-03 VITALS — BP 142/100 | HR 64 | Ht 71.0 in | Wt 202.1 lb

## 2013-08-03 DIAGNOSIS — I452 Bifascicular block: Secondary | ICD-10-CM | POA: Diagnosis not present

## 2013-08-03 DIAGNOSIS — Z01810 Encounter for preprocedural cardiovascular examination: Secondary | ICD-10-CM | POA: Insufficient documentation

## 2013-08-03 DIAGNOSIS — I10 Essential (primary) hypertension: Secondary | ICD-10-CM | POA: Diagnosis not present

## 2013-08-03 DIAGNOSIS — I2510 Atherosclerotic heart disease of native coronary artery without angina pectoris: Secondary | ICD-10-CM | POA: Diagnosis not present

## 2013-08-03 DIAGNOSIS — E785 Hyperlipidemia, unspecified: Secondary | ICD-10-CM | POA: Diagnosis not present

## 2013-08-03 MED ORDER — LISINOPRIL 40 MG PO TABS: ORAL_TABLET | ORAL | Status: DC

## 2013-08-03 NOTE — Progress Notes (Signed)
Patient ID: MACEN JOSLIN, male   DOB: 19-Apr-1929, 79 y.o.   MRN: [id number] 79 yo referred by Dr Para March. Has not been seen here in over 4 years when he moved to [place]. Had CABG in 2006 by Dr Maren Beach. Last myovue in 2007. No recent problems with SSCP, dyspnea or palpitations. Compliant with meds. Recent diagnosis of DM. Has increasing lower back pain that limits mobility. CRF;s otherwise HTN and elevated lipids. Has BP cuff at home but not sure it is accurate. Had been running around 150 systolic Compliant with meds. Limited exercise.   Discussed increasing lisinopril to 40mg   BP's running 140 systolic at home.  Needs glaucoma surgery and also teeth worked on . Does not remember his dentist or eye doctors name He is clear to have both systems worked on   ROS: Denies fever, malais, weight loss, blurry vision, decreased visual acuity, cough, sputum, SOB, hemoptysis, pleuritic pain, palpitaitons, heartburn, abdominal pain, melena, lower extremity edema, claudication, or rash.  All other systems reviewed and negative  General: Affect appropriate Healthy:  appears stated age HEENT: normal Neck supple with no adenopathy JVP normal no bruits no thyromegaly Lungs clear with no wheezing and good diaphragmatic motion Heart:  S1/S2 no murmur, no rub, gallop or click PMI normal Abdomen: benighn, BS positve, no tenderness, no AAA no bruit.  No HSM or HJR Distal pulses intact with no bruits No edema Neuro non-focal Skin warm and dry No muscular weakness   Current Outpatient Prescriptions  Medication Sig Dispense Refill  . allopurinol (ZYLOPRIM) 300 MG tablet TAKE 1 TABLET DAILY  90 tablet  3  . atorvastatin (LIPITOR) 40 MG tablet TAKE 1 TABLET DAILY  90 tablet  1  . carvedilol (COREG CR) 20 MG 24 hr capsule Take 1 capsule (20 mg total) by mouth daily.  90 capsule  3  . esomeprazole (NEXIUM) 40 MG capsule       . lisinopril (PRINIVIL,ZESTRIL) 20 MG tablet Take 1 tablet (20 mg total) by  mouth daily.  90 tablet  3  . primidone (MYSOLINE) 50 MG tablet TAKE ONE-HALF (1/2) TABLET (25 MG TOTAL) DAILY  45 tablet  3   No current facility-administered medications for this visit.    Allergies  Sulfonamide derivatives  Electrocardiogram:  SR rate 64 LAD RBBB   Assessment and Plan

## 2013-08-03 NOTE — Assessment & Plan Note (Signed)
Increase lisinopril to 40 mg daily [facility]

## 2013-08-03 NOTE — Patient Instructions (Addendum)
Your physician wants you to follow-up in:    6 MONTHS WITH DR Haywood Filler will receive a reminder letter in the mail two months in advance. If you don't receive a letter, please call our office to schedule the follow-up appointment. Your physician has recommended you make the following change in your medication: INCREASE LISINOPRIL TO  40 MG    CALL WITH  NAME  OF EYE  DOCTOR AND DENTIST LEAVE MESSAGE  FOR  [name] [name]   AT [phone] HAND WRITTEN NOTE  GIVEN TO PT OKAYING PT  FOR DENTAL  WORK AND  CATARACT  SURGERY  PER  DR Eden Emms

## 2013-08-03 NOTE — Assessment & Plan Note (Signed)
Cholesterol is at goal.  Continue current dose of statin and diet Rx.  No myalgias or side effects.  F/U  LFT's in 6 months. Lab Results  Component Value Date   LDLCALC 90 10/14/2012

## 2013-08-03 NOTE — Assessment & Plan Note (Signed)
Stable with no angina and good activity level.  Continue medical Rx  

## 2013-08-03 NOTE — Assessment & Plan Note (Signed)
Stable with no high grade AV block or sycope  ECG q 6 months

## 2013-08-03 NOTE — Assessment & Plan Note (Signed)
Clear to have glaucoma surgery and any dental work that is needed

## 2013-08-04 ENCOUNTER — Telehealth: Payer: Self-pay | Admitting: Cardiovascular Disease

## 2013-08-04 NOTE — Telephone Encounter (Signed)
Received request from Nurse fax box, documents faxed for surgical clearance. To: [facility] [place] Fax number: [phone] Attention: 08/04/13/km

## 2013-08-17 ENCOUNTER — Other Ambulatory Visit: Payer: Self-pay | Admitting: Family Medicine

## 2013-08-20 DIAGNOSIS — Z961 Presence of intraocular lens: Secondary | ICD-10-CM | POA: Diagnosis not present

## 2013-08-20 DIAGNOSIS — H269 Unspecified cataract: Secondary | ICD-10-CM | POA: Diagnosis not present

## 2013-08-20 DIAGNOSIS — H2510 Age-related nuclear cataract, unspecified eye: Secondary | ICD-10-CM | POA: Diagnosis not present

## 2013-09-21 ENCOUNTER — Telehealth: Payer: Self-pay | Admitting: Family Medicine

## 2013-09-21 MED ORDER — CARVEDILOL PHOSPHATE ER 20 MG PO CP24: 20.0000 mg | ORAL_CAPSULE | Freq: Every day | ORAL | Status: DC

## 2013-09-21 NOTE — Telephone Encounter (Signed)
Pt [facility]ill Coreg [facility]; pt has CPX scheduled 10/21/13. Advised refill done.

## 2013-09-21 NOTE — Telephone Encounter (Signed)
Pt is needing refill on [place] ??? [facility]. I asked him to contact them first but he insisted I send a note back.

## 2013-10-07 ENCOUNTER — Other Ambulatory Visit: Payer: Self-pay | Admitting: Family Medicine

## 2013-10-07 DIAGNOSIS — E119 Type 2 diabetes mellitus without complications: Secondary | ICD-10-CM

## 2013-10-07 DIAGNOSIS — Z125 Encounter for screening for malignant neoplasm of prostate: Secondary | ICD-10-CM

## 2013-10-10 HISTORY — PX: CATARACT EXTRACTION W/ INTRAOCULAR LENS IMPLANT: SHX1309

## 2013-10-14 ENCOUNTER — Other Ambulatory Visit (INDEPENDENT_AMBULATORY_CARE_PROVIDER_SITE_OTHER): Payer: Medicare Other

## 2013-10-14 DIAGNOSIS — E785 Hyperlipidemia, unspecified: Secondary | ICD-10-CM | POA: Diagnosis not present

## 2013-10-14 DIAGNOSIS — Z8546 Personal history of malignant neoplasm of prostate: Secondary | ICD-10-CM | POA: Diagnosis not present

## 2013-10-14 DIAGNOSIS — I10 Essential (primary) hypertension: Secondary | ICD-10-CM

## 2013-10-14 DIAGNOSIS — E119 Type 2 diabetes mellitus without complications: Secondary | ICD-10-CM

## 2013-10-14 DIAGNOSIS — Z125 Encounter for screening for malignant neoplasm of prostate: Secondary | ICD-10-CM

## 2013-10-14 LAB — COMPREHENSIVE METABOLIC PANEL
ALK PHOS: 72 U/L (ref 39–117)
ALT: 21 U/L (ref 0–53)
AST: 20 U/L (ref 0–37)
Albumin: 3.4 g/dL — ABNORMAL LOW (ref 3.5–5.2)
BILIRUBIN TOTAL: 0.9 mg/dL (ref 0.3–1.2)
BUN: 19 mg/dL (ref 6–23)
CO2: 27 mEq/L (ref 19–32)
CREATININE: 1.2 mg/dL (ref 0.4–1.5)
Calcium: 9.4 mg/dL (ref 8.4–10.5)
Chloride: 108 mEq/L (ref 96–112)
GFR: 61.5 mL/min (ref >60.00)
Glucose, Bld: 121 mg/dL — ABNORMAL HIGH (ref 70–99)
Potassium: 4.5 mEq/L (ref 3.5–5.1)
SODIUM: 140 meq/L (ref 135–145)
TOTAL PROTEIN: 6.4 g/dL (ref 6.0–8.3)

## 2013-10-14 LAB — PSA, MEDICARE: PSA: 2.74 ng/mL (ref 0.10–4.00)

## 2013-10-14 LAB — LIPID PANEL
Cholesterol: 141 mg/dL (ref 0–200)
HDL: 46.4 mg/dL (ref >39.00)
LDL CALC: 75 mg/dL (ref 0–99)
Total CHOL/HDL Ratio: 3
Triglycerides: 98 mg/dL (ref 0.0–149.0)
VLDL: 19.6 mg/dL (ref 0.0–40.0)

## 2013-10-14 LAB — HEMOGLOBIN A1C: HEMOGLOBIN A1C: 6.1 % (ref 4.6–6.5)

## 2013-10-16 ENCOUNTER — Other Ambulatory Visit: Payer: Self-pay | Admitting: Family Medicine

## 2013-10-19 DIAGNOSIS — I10 Essential (primary) hypertension: Secondary | ICD-10-CM | POA: Diagnosis not present

## 2013-10-19 DIAGNOSIS — M549 Dorsalgia, unspecified: Secondary | ICD-10-CM | POA: Diagnosis not present

## 2013-10-21 ENCOUNTER — Ambulatory Visit (INDEPENDENT_AMBULATORY_CARE_PROVIDER_SITE_OTHER): Payer: Medicare Other | Admitting: Family Medicine

## 2013-10-21 ENCOUNTER — Encounter: Payer: Self-pay | Admitting: Family Medicine

## 2013-10-21 VITALS — BP 130/84 | HR 72 | Temp 97.5°F | Ht 71.0 in | Wt 201.2 lb

## 2013-10-21 DIAGNOSIS — Z0000 Encounter for general adult medical examination without abnormal findings: Secondary | ICD-10-CM

## 2013-10-21 DIAGNOSIS — Z8546 Personal history of malignant neoplasm of prostate: Secondary | ICD-10-CM

## 2013-10-21 DIAGNOSIS — G252 Other specified forms of tremor: Secondary | ICD-10-CM

## 2013-10-21 DIAGNOSIS — G250 Essential tremor: Secondary | ICD-10-CM

## 2013-10-21 DIAGNOSIS — I10 Essential (primary) hypertension: Secondary | ICD-10-CM

## 2013-10-21 DIAGNOSIS — M109 Gout, unspecified: Secondary | ICD-10-CM | POA: Diagnosis not present

## 2013-10-21 DIAGNOSIS — R259 Unspecified abnormal involuntary movements: Secondary | ICD-10-CM

## 2013-10-21 DIAGNOSIS — E785 Hyperlipidemia, unspecified: Secondary | ICD-10-CM | POA: Diagnosis not present

## 2013-10-21 DIAGNOSIS — R251 Tremor, unspecified: Secondary | ICD-10-CM

## 2013-10-21 DIAGNOSIS — E119 Type 2 diabetes mellitus without complications: Secondary | ICD-10-CM

## 2013-10-21 NOTE — Patient Instructions (Addendum)
[name] [name] will call about your referral. Check with your insurance to see if they will cover the shingles shot. Recheck A1c before a visit in about 6 months.  Take care.  Glad to see you.

## 2013-10-21 NOTE — Progress Notes (Signed)
Pre-visit discussion using our clinic review tool. No additional management support is needed unless otherwise documented below in the visit note.  I have personally reviewed the Medicare Annual Wellness questionnaire and have noted 1. The patient's medical and social history 2. Their use of alcohol, tobacco or illicit drugs 3. Their current medications and supplements 4. The patient's functional ability including ADL's, fall risks, home safety risks and hearing or visual             impairment. 5. Diet and physical activities 6. Evidence for depression or mood disorders  The patients weight, height, BMI have been recorded in the chart and visual acuity is per eye clinic.  I have made referrals, counseling and provided education to the patient based review of the above and I have provided the pt with a written personalized care plan for preventive services.  See scanned forms.  Routine anticipatory guidance given to patient.  See health maintenance. Flu 2014 Shingles d/w pt.  PNA 2006 Tetanus 2013 Colonoscopy 2013 PSA w/o sig change.  CA hx noted.  D/w pt.  Advance directive dw pt. Wife designated if patient is incapacitated.  Cognitive function addressed- see scanned forms- and if abnormal then additional documentation follows.   Diabetes:  No meds Hypoglycemic episodes:no Hyperglycemic episodes:non Feet problems:no Blood Sugars averaging:~130 eye exam within last year: yes  Hypertension:    Using medication without problems or lightheadedness: yes Chest pain with exertion:no Edema:no Short of breath:no Labs d/w pt.   Elevated Cholesterol: Using medications without problems:yes Muscle aches: no Diet compliance:yes Exercise:yes  Gout, no ADE.  Compliant.  No flares.   PMH and SH reviewed.   Vital signs, Meds and allergies reviewed.  ROS: See HPI.  Otherwise nontributory.   GEN: nad, alert and oriented HEENT: mucous membranes moist NECK: supple w/o LA CV:  rrr. PULM: ctab, no inc wob ABD: soft, +bs EXT: no edema SKIN: no acute rash Tremor noted.   Diabetic foot exam: Normal inspection No skin breakdown No calluses  Normal DP pulses Normal sensation to light tough and monofilament Nails normal

## 2013-10-22 ENCOUNTER — Telehealth: Payer: Self-pay | Admitting: Family Medicine

## 2013-10-22 DIAGNOSIS — Z0000 Encounter for general adult medical examination without abnormal findings: Secondary | ICD-10-CM | POA: Insufficient documentation

## 2013-10-22 NOTE — Assessment & Plan Note (Signed)
No meds, continue as is.  He agrees.  Recheck in about 6 months.  Diet d/w pt

## 2013-10-22 NOTE — Assessment & Plan Note (Signed)
Controlled, continue current meds. Labs d/w pt.  

## 2013-10-22 NOTE — Assessment & Plan Note (Signed)
No ADE.  Compliant.  No flares.

## 2013-10-22 NOTE — Assessment & Plan Note (Signed)
See scanned forms.  Routine anticipatory guidance given to patient.  See health maintenance. Flu 2014 Shingles d/w pt.  PNA 2006 Tetanus 2013 Colonoscopy 2013 PSA w/o sig change.  CA hx noted.  D/w pt.  Advance directive dw pt. Wife designated if patient is incapacitated.  Cognitive function addressed- see scanned forms- and if abnormal then additional documentation follows.

## 2013-10-22 NOTE — Assessment & Plan Note (Signed)
Refer

## 2013-10-22 NOTE — Telephone Encounter (Signed)
Relevant patient education mailed to patient.  

## 2013-10-22 NOTE — Assessment & Plan Note (Signed)
PSA w/o sig change.

## 2013-11-01 ENCOUNTER — Ambulatory Visit: Payer: Medicare Other | Admitting: Neurology

## 2013-11-24 ENCOUNTER — Ambulatory Visit (INDEPENDENT_AMBULATORY_CARE_PROVIDER_SITE_OTHER): Payer: Medicare Other | Admitting: Family Medicine

## 2013-11-24 ENCOUNTER — Encounter: Payer: Self-pay | Admitting: Family Medicine

## 2013-11-24 VITALS — BP 138/82 | HR 108 | Temp 98.7°F | Wt 191.8 lb

## 2013-11-24 DIAGNOSIS — R1110 Vomiting, unspecified: Secondary | ICD-10-CM | POA: Insufficient documentation

## 2013-11-24 NOTE — Assessment & Plan Note (Signed)
Appears to be isolated and resolving. Sips of fluids in meantime.  Likely abd strain w/o ongoing intraabdominal pathology.  See instructions.  F/u prn. Would hold lisinopril tonight. D/w pt.

## 2013-11-24 NOTE — Patient Instructions (Signed)
Sips of fluids.  Skip the lisinopril tonight.  Press on the sore area to help with pain, especially when doing the breathing exercises.  This should gradually improve.  Notify me if not getting better.  Take care.

## 2013-11-24 NOTE — Progress Notes (Signed)
Pre visit review using our clinic review tool, if applicable. No additional management support is needed unless otherwise documented below in the visit note.  Chicken salad sandwich at lunch yesterday.  Vomiting started about 4PM yesterday.  Still able to drink some.  No solids in the meantime.  No fevers.  Still urinating.  A little "wobbly" on standing the last 2 days.  No diarrhea.  No blood in vomitus.  Last vomited about 6PM last night.  Some R sided abd pain. Voice is hoarse.  He can get a deep breath, but with pain.  No falls.  No other sick contacts.  Still on his BP meds.   Meds, vitals, and allergies reviewed.   ROS: See HPI.  Otherwise, noncontributory.  nad ncat Mmm, OP WNL No stridor, neck supple, no LA rrr ctab abd soft, mildly ttp along the R oblique and lateral portion of the upper abd rectus- pain there with a deep breath, resolved during deep breath with compression Ribs not ttp Ext w/o edema Tremor at baseline

## 2013-12-05 ENCOUNTER — Encounter (HOSPITAL_COMMUNITY): Payer: Self-pay | Admitting: Emergency Medicine

## 2013-12-05 ENCOUNTER — Inpatient Hospital Stay (HOSPITAL_COMMUNITY)
Admission: EM | Admit: 2013-12-05 | Discharge: 2013-12-15 | DRG: 853 | Disposition: A | Discharge status: Skilled Nursing Facility | Payer: Medicare Other | Attending: Internal Medicine | Admitting: Internal Medicine

## 2013-12-05 ENCOUNTER — Emergency Department (HOSPITAL_COMMUNITY): Payer: Medicare Other

## 2013-12-05 DIAGNOSIS — G250 Essential tremor: Secondary | ICD-10-CM

## 2013-12-05 DIAGNOSIS — Z87448 Personal history of other diseases of urinary system: Secondary | ICD-10-CM

## 2013-12-05 DIAGNOSIS — M109 Gout, unspecified: Secondary | ICD-10-CM

## 2013-12-05 DIAGNOSIS — K822 Perforation of gallbladder: Secondary | ICD-10-CM | POA: Diagnosis not present

## 2013-12-05 DIAGNOSIS — N259 Disorder resulting from impaired renal tubular function, unspecified: Secondary | ICD-10-CM

## 2013-12-05 DIAGNOSIS — Z79899 Other long term (current) drug therapy: Secondary | ICD-10-CM | POA: Diagnosis not present

## 2013-12-05 DIAGNOSIS — Z8546 Personal history of malignant neoplasm of prostate: Secondary | ICD-10-CM | POA: Diagnosis not present

## 2013-12-05 DIAGNOSIS — E559 Vitamin D deficiency, unspecified: Secondary | ICD-10-CM

## 2013-12-05 DIAGNOSIS — J189 Pneumonia, unspecified organism: Secondary | ICD-10-CM | POA: Diagnosis not present

## 2013-12-05 DIAGNOSIS — M549 Dorsalgia, unspecified: Secondary | ICD-10-CM

## 2013-12-05 DIAGNOSIS — R112 Nausea with vomiting, unspecified: Secondary | ICD-10-CM | POA: Diagnosis not present

## 2013-12-05 DIAGNOSIS — Z794 Long term (current) use of insulin: Secondary | ICD-10-CM

## 2013-12-05 DIAGNOSIS — K819 Cholecystitis, unspecified: Secondary | ICD-10-CM | POA: Diagnosis not present

## 2013-12-05 DIAGNOSIS — R6520 Severe sepsis without septic shock: Secondary | ICD-10-CM

## 2013-12-05 DIAGNOSIS — R279 Unspecified lack of coordination: Secondary | ICD-10-CM | POA: Diagnosis not present

## 2013-12-05 DIAGNOSIS — J9819 Other pulmonary collapse: Secondary | ICD-10-CM | POA: Diagnosis not present

## 2013-12-05 DIAGNOSIS — M6281 Muscle weakness (generalized): Secondary | ICD-10-CM | POA: Diagnosis not present

## 2013-12-05 DIAGNOSIS — E785 Hyperlipidemia, unspecified: Secondary | ICD-10-CM | POA: Diagnosis present

## 2013-12-05 DIAGNOSIS — I2510 Atherosclerotic heart disease of native coronary artery without angina pectoris: Secondary | ICD-10-CM | POA: Diagnosis present

## 2013-12-05 DIAGNOSIS — R1310 Dysphagia, unspecified: Secondary | ICD-10-CM | POA: Diagnosis not present

## 2013-12-05 DIAGNOSIS — E039 Hypothyroidism, unspecified: Secondary | ICD-10-CM | POA: Diagnosis present

## 2013-12-05 DIAGNOSIS — E860 Dehydration: Secondary | ICD-10-CM | POA: Diagnosis present

## 2013-12-05 DIAGNOSIS — K810 Acute cholecystitis: Secondary | ICD-10-CM | POA: Diagnosis not present

## 2013-12-05 DIAGNOSIS — N183 Chronic kidney disease, stage 3 unspecified: Secondary | ICD-10-CM | POA: Diagnosis present

## 2013-12-05 DIAGNOSIS — Z981 Arthrodesis status: Secondary | ICD-10-CM

## 2013-12-05 DIAGNOSIS — R062 Wheezing: Secondary | ICD-10-CM

## 2013-12-05 DIAGNOSIS — K8050 Calculus of bile duct without cholangitis or cholecystitis without obstruction: Secondary | ICD-10-CM | POA: Diagnosis present

## 2013-12-05 DIAGNOSIS — R262 Difficulty in walking, not elsewhere classified: Secondary | ICD-10-CM | POA: Diagnosis not present

## 2013-12-05 DIAGNOSIS — E119 Type 2 diabetes mellitus without complications: Secondary | ICD-10-CM | POA: Diagnosis present

## 2013-12-05 DIAGNOSIS — I10 Essential (primary) hypertension: Secondary | ICD-10-CM | POA: Diagnosis present

## 2013-12-05 DIAGNOSIS — K8062 Calculus of gallbladder and bile duct with acute cholecystitis without obstruction: Secondary | ICD-10-CM | POA: Diagnosis present

## 2013-12-05 DIAGNOSIS — C801 Malignant (primary) neoplasm, unspecified: Secondary | ICD-10-CM | POA: Diagnosis not present

## 2013-12-05 DIAGNOSIS — K219 Gastro-esophageal reflux disease without esophagitis: Secondary | ICD-10-CM | POA: Diagnosis present

## 2013-12-05 DIAGNOSIS — A419 Sepsis, unspecified organism: Secondary | ICD-10-CM

## 2013-12-05 DIAGNOSIS — I129 Hypertensive chronic kidney disease with stage 1 through stage 4 chronic kidney disease, or unspecified chronic kidney disease: Secondary | ICD-10-CM | POA: Diagnosis present

## 2013-12-05 DIAGNOSIS — G252 Other specified forms of tremor: Secondary | ICD-10-CM

## 2013-12-05 DIAGNOSIS — Z951 Presence of aortocoronary bypass graft: Secondary | ICD-10-CM

## 2013-12-05 DIAGNOSIS — I452 Bifascicular block: Secondary | ICD-10-CM | POA: Diagnosis present

## 2013-12-05 DIAGNOSIS — R1011 Right upper quadrant pain: Secondary | ICD-10-CM | POA: Diagnosis not present

## 2013-12-05 DIAGNOSIS — K8020 Calculus of gallbladder without cholecystitis without obstruction: Secondary | ICD-10-CM | POA: Diagnosis not present

## 2013-12-05 DIAGNOSIS — N281 Cyst of kidney, acquired: Secondary | ICD-10-CM | POA: Diagnosis not present

## 2013-12-05 DIAGNOSIS — Z48815 Encounter for surgical aftercare following surgery on the digestive system: Secondary | ICD-10-CM | POA: Diagnosis not present

## 2013-12-05 DIAGNOSIS — R109 Unspecified abdominal pain: Secondary | ICD-10-CM | POA: Diagnosis not present

## 2013-12-05 DIAGNOSIS — Z9181 History of falling: Secondary | ICD-10-CM | POA: Diagnosis not present

## 2013-12-05 DIAGNOSIS — R1311 Dysphagia, oral phase: Secondary | ICD-10-CM | POA: Diagnosis not present

## 2013-12-05 DIAGNOSIS — R1110 Vomiting, unspecified: Secondary | ICD-10-CM

## 2013-12-05 DIAGNOSIS — D649 Anemia, unspecified: Secondary | ICD-10-CM | POA: Diagnosis not present

## 2013-12-05 DIAGNOSIS — Z01810 Encounter for preprocedural cardiovascular examination: Secondary | ICD-10-CM

## 2013-12-05 DIAGNOSIS — R141 Gas pain: Secondary | ICD-10-CM | POA: Diagnosis not present

## 2013-12-05 DIAGNOSIS — K5900 Constipation, unspecified: Secondary | ICD-10-CM | POA: Diagnosis not present

## 2013-12-05 DIAGNOSIS — N179 Acute kidney failure, unspecified: Secondary | ICD-10-CM | POA: Diagnosis present

## 2013-12-05 DIAGNOSIS — Z8639 Personal history of other endocrine, nutritional and metabolic disease: Secondary | ICD-10-CM | POA: Diagnosis present

## 2013-12-05 DIAGNOSIS — Z0000 Encounter for general adult medical examination without abnormal findings: Secondary | ICD-10-CM

## 2013-12-05 DIAGNOSIS — Z1211 Encounter for screening for malignant neoplasm of colon: Secondary | ICD-10-CM

## 2013-12-05 DIAGNOSIS — I959 Hypotension, unspecified: Secondary | ICD-10-CM | POA: Diagnosis present

## 2013-12-05 DIAGNOSIS — I4510 Unspecified right bundle-branch block: Secondary | ICD-10-CM | POA: Diagnosis not present

## 2013-12-05 DIAGNOSIS — K651 Peritoneal abscess: Secondary | ICD-10-CM

## 2013-12-05 LAB — CBC WITH DIFFERENTIAL/PLATELET
Basophils Absolute: 0 10*3/uL (ref 0.0–0.1)
Basophils Relative: 0 % (ref 0–1)
EOS PCT: 0 % (ref 0–5)
Eosinophils Absolute: 0 10*3/uL (ref 0.0–0.7)
HEMATOCRIT: 36.8 % — AB (ref 39.0–52.0)
HEMOGLOBIN: 13.1 g/dL (ref 13.0–17.0)
Lymphocytes Relative: 3 % — ABNORMAL LOW (ref 12–46)
Lymphs Abs: 1.3 10*3/uL (ref 0.7–4.0)
MCH: 34.3 pg — ABNORMAL HIGH (ref 26.0–34.0)
MCHC: 35.6 g/dL (ref 30.0–36.0)
MCV: 96.3 fL (ref 78.0–100.0)
MONOS PCT: 3 % (ref 3–12)
Monocytes Absolute: 1.3 10*3/uL — ABNORMAL HIGH (ref 0.1–1.0)
NEUTROS ABS: 40.2 10*3/uL — AB (ref 1.7–7.7)
NEUTROS PCT: 94 % — AB (ref 43–77)
Platelets: 327 10*3/uL (ref 150–400)
RBC: 3.82 MIL/uL — AB (ref 4.22–5.81)
RDW: 14.2 % (ref 11.5–15.5)
WBC Morphology: INCREASED
WBC: 42.8 10*3/uL — AB (ref 4.0–10.5)

## 2013-12-05 LAB — COMPREHENSIVE METABOLIC PANEL
ALBUMIN: 2.3 g/dL — AB (ref 3.5–5.2)
ALK PHOS: 270 U/L — AB (ref 39–117)
ALT: 64 U/L — ABNORMAL HIGH (ref 0–53)
AST: 54 U/L — ABNORMAL HIGH (ref 0–37)
BUN: 22 mg/dL (ref 6–23)
CALCIUM: 9.5 mg/dL (ref 8.4–10.5)
CO2: 19 mEq/L (ref 19–32)
Chloride: 93 mEq/L — ABNORMAL LOW (ref 96–112)
Creatinine, Ser: 2.35 mg/dL — ABNORMAL HIGH (ref 0.50–1.35)
GFR calc non Af Amer: 24 mL/min — ABNORMAL LOW (ref >90)
GFR, EST AFRICAN AMERICAN: 28 mL/min — AB (ref >90)
Glucose, Bld: 232 mg/dL — ABNORMAL HIGH (ref 70–99)
Potassium: 4.7 mEq/L (ref 3.7–5.3)
Sodium: 134 mEq/L — ABNORMAL LOW (ref 137–147)
TOTAL PROTEIN: 7.2 g/dL (ref 6.0–8.3)
Total Bilirubin: 1.5 mg/dL — ABNORMAL HIGH (ref 0.3–1.2)

## 2013-12-05 LAB — I-STAT TROPONIN, ED: Troponin i, poc: 0.02 ng/mL (ref 0.00–0.08)

## 2013-12-05 LAB — LIPASE, BLOOD: LIPASE: 22 U/L (ref 11–59)

## 2013-12-05 LAB — POC OCCULT BLOOD, ED: Fecal Occult Bld: NEGATIVE

## 2013-12-05 LAB — GLUCOSE, CAPILLARY: GLUCOSE-CAPILLARY: 160 mg/dL — AB (ref 70–99)

## 2013-12-05 LAB — MRSA PCR SCREENING: MRSA BY PCR: NEGATIVE

## 2013-12-05 MED ORDER — PIPERACILLIN-TAZOBACTAM 3.375 G IVPB 30 MIN
3.3750 g | Freq: Once | INTRAVENOUS | Status: AC
  Administered 2013-12-05: 3.375 g via INTRAVENOUS
  Filled 2013-12-05: qty 50

## 2013-12-05 MED ORDER — ONDANSETRON HCL 4 MG PO TABS: 4.0000 mg | ORAL_TABLET | Freq: Four times a day (QID) | ORAL | Status: DC | PRN

## 2013-12-05 MED ORDER — ONDANSETRON HCL 4 MG/2ML IJ SOLN
4.0000 mg | Freq: Four times a day (QID) | INTRAMUSCULAR | Status: DC | PRN
  Administered 2013-12-06: 4 mg via INTRAVENOUS
  Filled 2013-12-05: qty 2

## 2013-12-05 MED ORDER — ONDANSETRON HCL 4 MG/2ML IJ SOLN
4.0000 mg | Freq: Once | INTRAMUSCULAR | Status: AC
  Administered 2013-12-05: 4 mg via INTRAVENOUS
  Filled 2013-12-05: qty 2

## 2013-12-05 MED ORDER — INSULIN ASPART 100 UNIT/ML ~~LOC~~ SOLN
0.0000 [IU] | Freq: Three times a day (TID) | SUBCUTANEOUS | Status: DC
  Administered 2013-12-06 – 2013-12-07 (×2): 2 [IU] via SUBCUTANEOUS
  Administered 2013-12-09 – 2013-12-10 (×2): 1 [IU] via SUBCUTANEOUS
  Administered 2013-12-10: 2 [IU] via SUBCUTANEOUS
  Administered 2013-12-11 – 2013-12-13 (×6): 1 [IU] via SUBCUTANEOUS
  Administered 2013-12-13 – 2013-12-14 (×3): 2 [IU] via SUBCUTANEOUS
  Administered 2013-12-15: 1 [IU] via SUBCUTANEOUS
  Administered 2013-12-15: 2 [IU] via SUBCUTANEOUS

## 2013-12-05 MED ORDER — IOHEXOL 300 MG/ML  SOLN
50.0000 mL | Freq: Once | INTRAMUSCULAR | Status: AC | PRN
  Administered 2013-12-05: 50 mL via ORAL

## 2013-12-05 MED ORDER — METOCLOPRAMIDE HCL 5 MG/ML IJ SOLN
10.0000 mg | Freq: Once | INTRAMUSCULAR | Status: AC
  Administered 2013-12-05: 10 mg via INTRAVENOUS
  Filled 2013-12-05: qty 2

## 2013-12-05 MED ORDER — INSULIN ASPART 100 UNIT/ML ~~LOC~~ SOLN: 0.0000 [IU] | Freq: Every day | SUBCUTANEOUS | Status: DC

## 2013-12-05 MED ORDER — SODIUM CHLORIDE 0.9 % IV BOLUS (SEPSIS)
1000.0000 mL | Freq: Once | INTRAVENOUS | Status: AC
  Administered 2013-12-05: 1000 mL via INTRAVENOUS

## 2013-12-05 MED ORDER — HEPARIN SODIUM (PORCINE) 5000 UNIT/ML IJ SOLN
5000.0000 [IU] | Freq: Three times a day (TID) | INTRAMUSCULAR | Status: DC
  Administered 2013-12-05 – 2013-12-06 (×2): 5000 [IU] via SUBCUTANEOUS
  Filled 2013-12-05 (×5): qty 1

## 2013-12-05 MED ORDER — SODIUM CHLORIDE 0.9 % IV SOLN
INTRAVENOUS | Status: DC
  Administered 2013-12-06 – 2013-12-08 (×5): via INTRAVENOUS

## 2013-12-05 MED ORDER — MORPHINE SULFATE 4 MG/ML IJ SOLN
4.0000 mg | Freq: Once | INTRAMUSCULAR | Status: AC
Start: 2013-12-05 — End: 2013-12-05
  Administered 2013-12-05: 4 mg via INTRAVENOUS
  Filled 2013-12-05: qty 1

## 2013-12-05 MED ORDER — MORPHINE SULFATE 4 MG/ML IJ SOLN
4.0000 mg | INTRAMUSCULAR | Status: DC | PRN
  Administered 2013-12-06 (×3): 4 mg via INTRAVENOUS
  Filled 2013-12-05 (×3): qty 1

## 2013-12-05 MED ORDER — VANCOMYCIN HCL IN DEXTROSE 1-5 GM/200ML-% IV SOLN
1000.0000 mg | Freq: Once | INTRAVENOUS | Status: AC
  Administered 2013-12-05: 1000 mg via INTRAVENOUS
  Filled 2013-12-05: qty 200

## 2013-12-05 NOTE — ED Notes (Signed)
Pt unable to urinate at this time. He was made aware that we need a sample.

## 2013-12-05 NOTE — Consult Note (Signed)
Reason for Consult:Acute cholecystitis Referring Physician: Dr. Stark Jock  Cardiology - Dr. Johnsie Cancel PCP - Dr. Renford Dills  [name] [name] is an 80 y.o. male.  HPI: This is an 80 yo male that presents with a 2 week history of abdominal pain in his RUQ associated with nausea and weakness.  He denies any fever or chills.  Appetite is poor.  He has had some bilious vomiting.  He has felt weak and has fallen a couple of times at home.  He was brought to the ED today for further evaluation.  WBC 42, BP in the 90's.  CT scan showed acute cholecystitis, with possible choledocholithiasis.    Past Medical History  Diagnosis Date  . Diabetes mellitus   . Hypertension 1995  . Hyperlipidemia 12/2005  . Benign essential tremor 2005  . Gout 1995  . GERD (gastroesophageal reflux disease)   . Coronary artery disease     s/p CABG, DR Johnsie Cancel  . Glomerulonephritis 1965    3 months "Rest"  . Prostate cancer     S/P treatment, followed by Urology    Past Surgical History  Procedure Laterality Date  . Abdominal cat  02/15/2004    (with and without) - Left renal cyst, 3-4 cm  . Cystoscopy  02/15/2004    Mod BPH, moder tribec ?  . Cryotherapy of prostate  09/18/2005    2nd, prostate CA  . Adenosine myoview  01/24/2006    Ischemia by EKG, Cath  . Abdominal cat  01/25/2006    Negative AAA  . Coronary artery bypass graft    . Coronary artery bypass graft  01/31/2006    X 4  . Lumbar fusion      Family History  Problem Relation Age of Onset  . Heart disease Mother   . Cancer Mother   . Diabetes Sister   . Diabetes Sister   . Cancer Brother     prostate  . Prostate cancer Brother   . Hypothyroidism Brother   . Depression Neg Hx   . Alcohol abuse Neg Hx   . Drug abuse Neg Hx   . Stroke Neg Hx   . Colon cancer Neg Hx     Social History:  reports that he has never smoked. He has never used smokeless tobacco. He reports that he does not drink alcohol or use illicit drugs.  Allergies:   Allergies  Allergen Reactions  . Sulfonamide Derivatives     REACTION: lethargic    Medications:   Prior to Admission medications   Medication Sig Start Date End Date Taking? Authorizing Provider  allopurinol (ZYLOPRIM) 300 MG tablet Take 300 mg by mouth daily.   Yes Historical Provider, MD  atorvastatin (LIPITOR) 40 MG tablet Take 40 mg by mouth daily.   Yes Historical Provider, MD  carvedilol (COREG CR) 20 MG 24 hr capsule Take 1 capsule (20 mg total) by mouth daily. 09/21/13  Yes Tonia Ghent, MD  esomeprazole (NEXIUM) 40 MG capsule Daily as needed 08/17/13  Yes Tonia Ghent, MD  lisinopril (PRINIVIL,ZESTRIL) 40 MG tablet Take 40 mg by mouth daily. 1  TAB  EVERY DAY 08/03/13  Yes Josue Hector, MD  primidone (MYSOLINE) 50 MG tablet Take 25 mg by mouth daily.   Yes Historical Provider, MD     Results for orders placed during the hospital encounter of 12/05/13 (from the past 48 hour(s))  POC OCCULT BLOOD, ED     Status: None   Collection Time  12/05/13  2:51 PM      Result Value Ref Range   Fecal Occult Bld NEGATIVE  NEGATIVE  COMPREHENSIVE METABOLIC PANEL     Status: Abnormal   Collection Time    12/05/13  2:52 PM      Result Value Ref Range   Sodium 134 (*) 137 - 147 mEq/L   Potassium 4.7  3.7 - 5.3 mEq/L   Chloride 93 (*) 96 - 112 mEq/L   CO2 19  19 - 32 mEq/L   Glucose, Bld 232 (*) 70 - 99 mg/dL   BUN 22  6 - 23 mg/dL   Creatinine, Ser 2.35 (*) 0.50 - 1.35 mg/dL   Calcium 9.5  8.4 - 10.5 mg/dL   Total Protein 7.2  6.0 - 8.3 g/dL   Albumin 2.3 (*) 3.5 - 5.2 g/dL   AST 54 (*) 0 - 37 U/L   ALT 64 (*) 0 - 53 U/L   Alkaline Phosphatase 270 (*) 39 - 117 U/L   Total Bilirubin 1.5 (*) 0.3 - 1.2 mg/dL   GFR calc non Af Amer 24 (*) >90 mL/min   GFR calc Af Amer 28 (*) >90 mL/min   Comment: (NOTE)     The eGFR has been calculated using the CKD EPI equation.     This calculation has not been validated in all clinical situations.     eGFR's persistently <90 mL/min  signify possible Chronic Kidney     Disease.  LIPASE, BLOOD     Status: None   Collection Time    12/05/13  2:52 PM      Result Value Ref Range   Lipase 22  11 - 59 U/L  CBC WITH DIFFERENTIAL     Status: Abnormal   Collection Time    12/05/13  2:52 PM      Result Value Ref Range   WBC 42.8 (*) 4.0 - 10.5 K/uL   RBC 3.82 (*) 4.22 - 5.81 MIL/uL   Hemoglobin 13.1  13.0 - 17.0 g/dL   HCT 36.8 (*) 39.0 - 52.0 %   MCV 96.3  78.0 - 100.0 fL   MCH 34.3 (*) 26.0 - 34.0 pg   MCHC 35.6  30.0 - 36.0 g/dL   RDW 14.2  11.5 - 15.5 %   Platelets 327  150 - 400 K/uL   Neutrophils Relative % 94 (*) 43 - 77 %   Lymphocytes Relative 3 (*) 12 - 46 %   Monocytes Relative 3  3 - 12 %   Eosinophils Relative 0  0 - 5 %   Basophils Relative 0  0 - 1 %   Neutro Abs 40.2 (*) 1.7 - 7.7 K/uL   Lymphs Abs 1.3  0.7 - 4.0 K/uL   Monocytes Absolute 1.3 (*) 0.1 - 1.0 K/uL   Eosinophils Absolute 0.0  0.0 - 0.7 K/uL   Basophils Absolute 0.0  0.0 - 0.1 K/uL   WBC Morphology INCREASED BANDS (>20% BANDS)     Comment: MILD LEFT SHIFT (1-5% METAS, OCC MYELO, OCC BANDS)     TOXIC GRANULATION  I-STAT TROPOININ, ED     Status: None   Collection Time    12/05/13  3:07 PM      Result Value Ref Range   Troponin i, poc 0.02  0.00 - 0.08 ng/mL   Comment 3            Comment: Due to the release kinetics of cTnI,     a  negative result within the first hours     of the onset of symptoms does not rule out     myocardial infarction with certainty.     If myocardial infarction is still suspected,     repeat the test at appropriate intervals.    Ct Abdomen Pelvis Wo Contrast  12/05/2013   CLINICAL DATA:  Abdominal pain and vomiting.  Weakness.  EXAM: CT ABDOMEN AND PELVIS WITHOUT CONTRAST  TECHNIQUE: Multidetector CT imaging of the abdomen and pelvis was performed following the standard protocol without intravenous contrast.  COMPARISON:  CT ABDOMEN W/O CM dated 01/28/2006  FINDINGS: Lung Bases: Right-greater-than-left  basilar atelectasis.  Liver: Low attenuation is present in the right hepatic lobe adjacent to the gallbladder fossa, suspicious for hepatic edema. Focal fatty infiltration can also have this appearance.  Spleen:  Old granulomatous disease.  Gallbladder: Hydropic gallbladder, with calcified gallstones in the gallbladder neck. Suspect tiny calcified stone in the proximal common bile duct. Mild stranding is present around the gallbladder, again supporting a diagnosis of acute cholecystitis. Gallbladder ultrasound recommended for further assessment.  Common bile duct: Possible tiny gallstone in the proximal common bile duct. Distal common bile duct within normal limits for age.  Pancreas:  Normal.  Adrenal glands:  Normal.  Kidneys: Low-density lesions are present in the kidneys compatible with hepatic cysts. The ureters appear within normal limits.  Stomach: Moderate hiatal hernia. Contrast present in the distal esophagus an hernia. Stomach is distended with contrast.  Small bowel: Duodenum appears normal. Small bowel is normal. No mesenteric adenopathy.  Colon: Normal appendix. Colonic diverticulosis. There is no diverticulitis.  Pelvic Genitourinary: Urinary bladder within normal limits. Prostatomegaly with 52 mm transverse prostate span. No free fluid.  Bones: Posterior lumbar interbody fusion. No aggressive osseous lesions.  Vasculature: Atherosclerosis without gross acute vascular abnormality.  Body Wall: Tiny fat containing periumbilical hernia.  IMPRESSION: 1. Hydropic gallbladder with calcified gallstones and mild stranding suspicious for acute cholecystitis. Consider correlation with ultrasound. 2. Possible tiny calcified stone in the proximal common bile duct. 3. Low attenuation in the liver adjacent to the gallbladder fossa may represent hepatic edema in the setting of cholecystitis or focal fatty infiltration. 4. Small hiatal hernia. 5. Renal cysts.   Electronically Signed   By: Dereck Ligas M.D.   On:  12/05/2013 17:37    Review of Systems  Constitutional: Negative for weight loss.  HENT: Negative for ear discharge, ear pain, hearing loss and tinnitus.   Eyes: Negative for blurred vision, double vision, photophobia and pain.  Respiratory: Negative for cough, sputum production and shortness of breath.   Cardiovascular: Negative for chest pain.  Gastrointestinal: Positive for nausea, vomiting and abdominal pain.  Genitourinary: Negative for dysuria, urgency, frequency and flank pain.  Musculoskeletal: Positive for falls. Negative for back pain, joint pain, myalgias and neck pain.  Neurological: Negative for dizziness, tingling, sensory change, focal weakness, loss of consciousness and headaches.  Endo/Heme/Allergies: Does not bruise/bleed easily.  Psychiatric/Behavioral: Negative for depression, memory loss and substance abuse. The patient is not nervous/anxious.    Blood pressure 97/52, pulse 83, temperature 97.5 F (36.4 C), temperature source Oral, resp. rate 28, SpO2 97.00%. Physical Exam WDWN in NAD HEENT:  EOMI, sclera anicteric Neck:  No masses, no thyromegaly Lungs:  CTA bilaterally; normal respiratory effort CV:  Regular rate and rhythm; no murmurs Abd:  +bowel sounds, soft, tender in RUQ below costal margin; no palpable masses Ext:  Well-perfused; no edema Skin:  Warm, dry; no  sign of jaundice  Assessment/Plan: 1.  Acute cholecystitis with sepsis 2.  Possible choledocholithiasis 3.  Coronary artery disease - Cardiology clearance needed 4.  Acute renal insufficiency 5.  DM2  Appreciate CCM management of his medical problems Would recheck LFT's in AM to see if T.bili improves.  If it increases, would consult GI for ERCP. Keep patient NPO x ice chips for now. IV antibiotics  Will probably proceed with laparoscopic cholecystectomy in next couple of days if medical stable.  Jarmar Rousseau K. 12/05/2013, 8:05 PM

## 2013-12-05 NOTE — ED Notes (Signed)
Pt in c/o abd pain with vomiting, generalized weakness and fell twice last night when trying to stand up due to weakness in his legs- states all of this started when having several teeth removed in the middle of march

## 2013-12-05 NOTE — ED Provider Notes (Signed)
CSN: [id number]     Arrival date & time 12/05/13  1311 History   First MD Initiated Contact with Patient 12/05/13 1436     Chief Complaint  Patient presents with  . Weakness  . Fall  . Abdominal Pain     (Consider location/radiation/quality/duration/timing/severity/associated sxs/prior Treatment) HPI ComPlains of generalized weakness for the past 1.5 weeks. A accompanied by epigastric pain, worse with deep inspiration. Last bowel movement was 4 days ago. He denies urinary symptoms denies fever. Due to generalized weakness he fell yesterday. No injury. Weakness is worse with standing. He denies blood per rectum. Denies shortness of breath. No other masses symptoms. No treatment prior to coming here. Past Medical History  Diagnosis Date  . Diabetes mellitus   . Hypertension 1995  . Hyperlipidemia 12/2005  . Benign essential tremor 2005  . Gout 1995  . GERD (gastroesophageal reflux disease)   . Coronary artery disease     s/p CABG, DR Johnsie Cancel  . Glomerulonephritis 1965    3 months "Rest"  . Prostate cancer     S/P treatment, followed by Urology   Past Surgical History  Procedure Laterality Date  . Abdominal cat  02/15/2004    (with and without) - Left renal cyst, 3-4 cm  . Cystoscopy  02/15/2004    Mod BPH, moder tribec ?  . Cryotherapy of prostate  09/18/2005    2nd, prostate CA  . Adenosine myoview  01/24/2006    Ischemia by EKG, Cath  . Abdominal cat  01/25/2006    Negative AAA  . Coronary artery bypass graft    . Coronary artery bypass graft  01/31/2006    X 4  . Lumbar fusion     Family History  Problem Relation Age of Onset  . Heart disease Mother   . Cancer Mother   . Diabetes Sister   . Diabetes Sister   . Cancer Brother     prostate  . Prostate cancer Brother   . Hypothyroidism Brother   . Depression Neg Hx   . Alcohol abuse Neg Hx   . Drug abuse Neg Hx   . Stroke Neg Hx   . Colon cancer Neg Hx    History  Substance Use Topics  . Smoking status:  Never Smoker   . Smokeless tobacco: Never Used  . Alcohol Use: No    Review of Systems  Constitutional: Positive for appetite change.  HENT: Negative.   Respiratory: Negative.   Cardiovascular: Negative.   Gastrointestinal: Positive for nausea, abdominal pain and constipation.  Musculoskeletal: Negative.   Skin: Negative.   Neurological: Positive for weakness.  Psychiatric/Behavioral: Negative.   All other systems reviewed and are negative.      Allergies  Sulfonamide derivatives  Home Medications   Current Outpatient Rx  Name  Route  Sig  Dispense  Refill  . allopurinol (ZYLOPRIM) 300 MG tablet      TAKE 1 TABLET DAILY   90 tablet   3   . atorvastatin (LIPITOR) 40 MG tablet      TAKE 1 TABLET DAILY   90 tablet   0   . carvedilol (COREG CR) 20 MG 24 hr capsule   Oral   Take 1 capsule (20 mg total) by mouth daily.   90 capsule   0   . esomeprazole (NEXIUM) 40 MG capsule      Daily as needed         . lisinopril (PRINIVIL,ZESTRIL) 40 MG tablet  1  TAB  EVERY DAY   90 tablet   3   . primidone (MYSOLINE) 50 MG tablet      TAKE ONE-HALF (1/2) TABLET (25 MG TOTAL) DAILY   45 tablet   3    BP 121/69  Pulse 89  Temp(Src) 97.5 F (36.4 C) (Oral)  Resp 24  SpO2 98% Physical Exam  Nursing note and vitals reviewed. Constitutional:  Chronically ill-appearing  HENT:  Head: Normocephalic and atraumatic.  Uterus membranes dry  Eyes: Conjunctivae are normal. Pupils are equal, round, and reactive to light.  Neck: Neck supple. No tracheal deviation present. No thyromegaly present.  Cardiovascular: Normal rate and regular rhythm.   No murmur heard. Pulmonary/Chest: Effort normal and breath sounds normal.  Abdominal: Soft. Bowel sounds are normal. He exhibits no distension. There is no tenderness.  Genitourinary: Guaiac negative stool. No penile tenderness.  No stool on examining finger contents on examining finger Hemoccult negative   Musculoskeletal: Normal range of motion. He exhibits no edema and no tenderness.  Neurological: He is alert. Coordination normal.  Skin: Skin is warm and dry. No rash noted.  Psychiatric: He has a normal mood and affect.    ED Course  Procedures (including critical care time) Labs Review Labs Reviewed - No data to display Imaging Review No results found.   EKG Interpretation   Date/Time:  Sunday December 05 2013 15:09:20 EDT Ventricular Rate:  83 PR Interval:  191 QRS Duration: 138 QT Interval:  409 QTC Calculation: 481 R Axis:   -36 Text Interpretation:  Sinus rhythm Right bundle branch block No  significant change since last tracing Confirmed by Winfred Leeds  MD, Lennell Shanks  [phone]) on 12/05/2013 3:20:13 PM     Results for orders placed during the hospital encounter of 12/05/13  COMPREHENSIVE METABOLIC PANEL      Result Value Ref Range   Sodium 134 (*) 137 - 147 mEq/L   Potassium 4.7  3.7 - 5.3 mEq/L   Chloride 93 (*) 96 - 112 mEq/L   CO2 19  19 - 32 mEq/L   Glucose, Bld 232 (*) 70 - 99 mg/dL   BUN 22  6 - 23 mg/dL   Creatinine, Ser 2.35 (*) 0.50 - 1.35 mg/dL   Calcium 9.5  8.4 - 10.5 mg/dL   Total Protein 7.2  6.0 - 8.3 g/dL   Albumin 2.3 (*) 3.5 - 5.2 g/dL   AST 54 (*) 0 - 37 U/L   ALT 64 (*) 0 - 53 U/L   Alkaline Phosphatase 270 (*) 39 - 117 U/L   Total Bilirubin 1.5 (*) 0.3 - 1.2 mg/dL   GFR calc non Af Amer 24 (*) >90 mL/min   GFR calc Af Amer 28 (*) >90 mL/min  LIPASE, BLOOD      Result Value Ref Range   Lipase 22  11 - 59 U/L  CBC WITH DIFFERENTIAL      Result Value Ref Range   WBC 42.8 (*) 4.0 - 10.5 K/uL   RBC 3.82 (*) 4.22 - 5.81 MIL/uL   Hemoglobin 13.1  13.0 - 17.0 g/dL   HCT 36.8 (*) 39.0 - 52.0 %   MCV 96.3  78.0 - 100.0 fL   MCH 34.3 (*) 26.0 - 34.0 pg   MCHC 35.6  30.0 - 36.0 g/dL   RDW 14.2  11.5 - 15.5 %   Platelets 327  150 - 400 K/uL   Neutrophils Relative % 94 (*) 43 - 77 %  Lymphocytes Relative 3 (*) 12 - 46 %   Monocytes Relative 3  3 - 12  %   Eosinophils Relative 0  0 - 5 %   Basophils Relative 0  0 - 1 %   Neutro Abs 40.2 (*) 1.7 - 7.7 K/uL   Lymphs Abs 1.3  0.7 - 4.0 K/uL   Monocytes Absolute 1.3 (*) 0.1 - 1.0 K/uL   Eosinophils Absolute 0.0  0.0 - 0.7 K/uL   Basophils Absolute 0.0  0.0 - 0.1 K/uL   WBC Morphology INCREASED BANDS (>20% BANDS)    POC OCCULT BLOOD, ED      Result Value Ref Range   Fecal Occult Bld NEGATIVE  NEGATIVE  I-STAT TROPOININ, ED      Result Value Ref Range   Troponin i, poc 0.02  0.00 - 0.08 ng/mL   Comment 3            No results found.  MDM  Pt signed out to Dr. Stark Jock 430 pm . He will check lab work and CT scan.   Inpatient stay likely. Primidone level and metabolites result tomorrow. Final diagnoses:  None   diagnoses #1 dehydration #2 abdominal pain #3 elevated liver function tests #4 renal insufficiency #5 leukocytosis      Orlie Dakin, MD 12/05/13 1702

## 2013-12-05 NOTE — ED Provider Notes (Deleted)
CSN: [id number]     Arrival date & time 12/05/13  1311 History   First MD Initiated Contact with Patient 12/05/13 1436     Chief Complaint  Patient presents with  . Weakness  . Fall  . Abdominal Pain     (Consider location/radiation/quality/duration/timing/severity/associated sxs/prior Treatment) HPI  Past Medical History  Diagnosis Date  . Diabetes mellitus   . Hypertension 1995  . Hyperlipidemia 12/2005  . Benign essential tremor 2005  . Gout 1995  . GERD (gastroesophageal reflux disease)   . Coronary artery disease     s/p CABG, DR Johnsie Cancel  . Glomerulonephritis 1965    3 months "Rest"  . Prostate cancer     S/P treatment, followed by Urology   Past Surgical History  Procedure Laterality Date  . Abdominal cat  02/15/2004    (with and without) - Left renal cyst, 3-4 cm  . Cystoscopy  02/15/2004    Mod BPH, moder tribec ?  . Cryotherapy of prostate  09/18/2005    2nd, prostate CA  . Adenosine myoview  01/24/2006    Ischemia by EKG, Cath  . Abdominal cat  01/25/2006    Negative AAA  . Coronary artery bypass graft    . Coronary artery bypass graft  01/31/2006    X 4  . Lumbar fusion     Family History  Problem Relation Age of Onset  . Heart disease Mother   . Cancer Mother   . Diabetes Sister   . Diabetes Sister   . Cancer Brother     prostate  . Prostate cancer Brother   . Hypothyroidism Brother   . Depression Neg Hx   . Alcohol abuse Neg Hx   . Drug abuse Neg Hx   . Stroke Neg Hx   . Colon cancer Neg Hx    History  Substance Use Topics  . Smoking status: Never Smoker   . Smokeless tobacco: Never Used  . Alcohol Use: No    Review of Systems    Allergies  Sulfonamide derivatives  Home Medications   Current Outpatient Rx  Name  Route  Sig  Dispense  Refill  . allopurinol (ZYLOPRIM) 300 MG tablet   Oral   Take 300 mg by mouth daily.         Marland Kitchen atorvastatin (LIPITOR) 40 MG tablet   Oral   Take 40 mg by mouth daily.         .  carvedilol (COREG CR) 20 MG 24 hr capsule   Oral   Take 1 capsule (20 mg total) by mouth daily.   90 capsule   0   . esomeprazole (NEXIUM) 40 MG capsule      Daily as needed         . lisinopril (PRINIVIL,ZESTRIL) 40 MG tablet   Oral   Take 40 mg by mouth daily. 1  TAB  EVERY DAY         . primidone (MYSOLINE) 50 MG tablet   Oral   Take 25 mg by mouth daily.          BP 121/69  Pulse 89  Temp(Src) 97.5 F (36.4 C) (Oral)  Resp 24  SpO2 98% Physical Exam  ED Course  Procedures (including critical care time) Labs Review Labs Reviewed  COMPREHENSIVE METABOLIC PANEL - Abnormal; Notable for the following:    Sodium 134 (*)    Chloride 93 (*)    Glucose, Bld 232 (*)    Creatinine, Ser  2.35 (*)    Albumin 2.3 (*)    AST 54 (*)    ALT 64 (*)    Alkaline Phosphatase 270 (*)    Total Bilirubin 1.5 (*)    GFR calc non Af Amer 24 (*)    GFR calc Af Amer 28 (*)    All other components within normal limits  CBC WITH DIFFERENTIAL - Abnormal; Notable for the following:    WBC 42.8 (*)    RBC 3.82 (*)    HCT 36.8 (*)    MCH 34.3 (*)    Neutrophils Relative % 94 (*)    Lymphocytes Relative 3 (*)    Neutro Abs 40.2 (*)    Monocytes Absolute 1.3 (*)    All other components within normal limits  LIPASE, BLOOD  URINALYSIS, ROUTINE W REFLEX MICROSCOPIC  PRIMIDONE AND METABOLITE LEVEL  POC OCCULT BLOOD, ED  I-STAT TROPOININ, ED   Imaging Review No results found.   EKG Interpretation   Date/Time:  Sunday December 05 2013 15:09:20 EDT Ventricular Rate:  83 PR Interval:  191 QRS Duration: 138 QT Interval:  409 QTC Calculation: 481 R Axis:   -36 Text Interpretation:  Sinus rhythm Right bundle branch block No  significant change since last tracing Confirmed by Winfred Leeds  MD, Blanche Scovell  [phone]) on 12/05/2013 3:20:13 PM      Results for orders placed during the hospital encounter of 12/05/13  COMPREHENSIVE METABOLIC PANEL      Result Value Ref Range   Sodium 134 (*) 137  - 147 mEq/L   Potassium 4.7  3.7 - 5.3 mEq/L   Chloride 93 (*) 96 - 112 mEq/L   CO2 19  19 - 32 mEq/L   Glucose, Bld 232 (*) 70 - 99 mg/dL   BUN 22  6 - 23 mg/dL   Creatinine, Ser 2.35 (*) 0.50 - 1.35 mg/dL   Calcium 9.5  8.4 - 10.5 mg/dL   Total Protein 7.2  6.0 - 8.3 g/dL   Albumin 2.3 (*) 3.5 - 5.2 g/dL   AST 54 (*) 0 - 37 U/L   ALT 64 (*) 0 - 53 U/L   Alkaline Phosphatase 270 (*) 39 - 117 U/L   Total Bilirubin 1.5 (*) 0.3 - 1.2 mg/dL   GFR calc non Af Amer 24 (*) >90 mL/min   GFR calc Af Amer 28 (*) >90 mL/min  LIPASE, BLOOD      Result Value Ref Range   Lipase 22  11 - 59 U/L  CBC WITH DIFFERENTIAL      Result Value Ref Range   WBC 42.8 (*) 4.0 - 10.5 K/uL   RBC 3.82 (*) 4.22 - 5.81 MIL/uL   Hemoglobin 13.1  13.0 - 17.0 g/dL   HCT 36.8 (*) 39.0 - 52.0 %   MCV 96.3  78.0 - 100.0 fL   MCH 34.3 (*) 26.0 - 34.0 pg   MCHC 35.6  30.0 - 36.0 g/dL   RDW 14.2  11.5 - 15.5 %   Platelets 327  150 - 400 K/uL   Neutrophils Relative % 94 (*) 43 - 77 %   Lymphocytes Relative 3 (*) 12 - 46 %   Monocytes Relative 3  3 - 12 %   Eosinophils Relative 0  0 - 5 %   Basophils Relative 0  0 - 1 %   Neutro Abs 40.2 (*) 1.7 - 7.7 K/uL   Lymphs Abs 1.3  0.7 - 4.0 K/uL   Monocytes Absolute  1.3 (*) 0.1 - 1.0 K/uL   Eosinophils Absolute 0.0  0.0 - 0.7 K/uL   Basophils Absolute 0.0  0.0 - 0.1 K/uL   WBC Morphology INCREASED BANDS (>20% BANDS)    POC OCCULT BLOOD, ED      Result Value Ref Range   Fecal Occult Bld NEGATIVE  NEGATIVE  I-STAT TROPOININ, ED      Result Value Ref Range   Troponin i, poc 0.02  0.00 - 0.08 ng/mL   Comment 3            No results found.  4:30 PM patient continues to complain of nausea after treatment with intravenous Zofran and intravenous fluids. Also requests medicine for abdominal pain. Morphine ordered. MDM  Patient signed out to Dr.Delo at 4:30 PM. He will check further lab work and CT scan. Inpatient stay likely.Pimadone and metabolite level will not be  resulted until tomorrow Final diagnoses:  None   diagnosis #1 hydration #2 abdominal pain #3 elevated liver function tests #4 renal insufficiency #5 leukocytosis     Orlie Dakin, MD 12/05/13 1640

## 2013-12-05 NOTE — H&P (Addendum)
Hospitalist Admission History and Physical  Patient name: [name] [name] Medical record number: [id number] Date of birth: May 20, 1929 Age: 80 y.o. Gender: male  Primary Care Provider: Elsie Stain, MD  Chief Complaint: cholecystitis, sepsis  History of Present Illness:This is a 80 y.o. year old male with multiple medical problems including type 2 DM, CAD s/p CABG, HLD presenting with cholecystitis and sepsis. Patient states that he's had right upper quadrant/right rib pain for the past 2 weeks with associated nausea and weakness. Was seen by PCP about 2 weeks ago for symptoms. Was told it was a musculoskeletal strain. Denies any fevers or chills. Has had progressive decreased appetite as well as intermittent episodes of bilious vomiting. Has had 1-2 falls at home. No direct head trauma or loss of consciousness. Denies any chest pain, shortness of breath, diarrhea, dysuria, hemiparesis, confusion. We'll draw to the ER by daughter for worsening symptoms. Was no data White blood cell count of greater than 42,000. CT of the abdomen and pelvis shows enlarged gallbladder with calcified gallstones and stranding concerning for acute cholecystitis as well as possible tiny calcified stone in the proximal bile duct. Creatinine also noted 2.35 with a baseline creatinine of 1.2. AST/ALT mildly elevated at 54/64 respectively. ALP markedly elevated @ 270. tbili @ 1.5. General surgery was contacted by ER physician. Per ER physician, surgeon recommends medical mission because of comorbidities. May also need possible preoperative clearance. Noted note by Dr. Johnsie Cancel 07/2013 showed patient otherwise medically optimized prior to dental and ophthalmologic procedure. Blood pressures have persisted in the 90s to 100s despite IV fluids. Baseline blood pressures 130s 140s per family.  Patient Active Problem List   Diagnosis Date Noted  . Acute cholecystitis 12/05/2013  . Vomiting 11/24/2013  . Medicare annual wellness visit,  initial 10/22/2013  . Preop cardiovascular exam 08/03/2013  . Colon cancer screening 10/20/2011  . Back pain 10/20/2011  . RBBB (right bundle branch block with left anterior fascicular block) 09/20/2011  . HYPOTHYROIDISM, BORDERLINE 03/23/2009  . UNSPECIFIED VITAMIN D DEFICIENCY 03/23/2009  . TREMOR NEC 12/18/2006  . CAD 12/18/2006  . IMPAIRED RENAL FUNCTION NOS 12/18/2006  . ADENOCARCINOMA, PROSTATE, HX OF 12/18/2006  . HEMATURIA, HX OF 12/18/2006  . HYPERLIPIDEMIA 12/08/2005  . DIABETES MELLITUS, TYPE II 02/07/2005  . GOUT 09/09/1993  . HYPERTENSION 09/09/1993   Past Medical History: Past Medical History  Diagnosis Date  . Diabetes mellitus   . Hypertension 1995  . Hyperlipidemia 12/2005  . Benign essential tremor 2005  . Gout 1995  . GERD (gastroesophageal reflux disease)   . Coronary artery disease     s/p CABG, DR Johnsie Cancel  . Glomerulonephritis 1965    3 months "Rest"  . Prostate cancer     S/P treatment, followed by Urology    Past Surgical History: Past Surgical History  Procedure Laterality Date  . Abdominal cat  02/15/2004    (with and without) - Left renal cyst, 3-4 cm  . Cystoscopy  02/15/2004    Mod BPH, moder tribec ?  . Cryotherapy of prostate  09/18/2005    2nd, prostate CA  . Adenosine myoview  01/24/2006    Ischemia by EKG, Cath  . Abdominal cat  01/25/2006    Negative AAA  . Coronary artery bypass graft    . Coronary artery bypass graft  01/31/2006    X 4  . Lumbar fusion      Social History: History   Social History  . Marital Status: Married  Spouse Name: N/A    Number of Children: 1  . Years of Education: N/A   Occupational History  . Retired Pharmacist, community    Social History Main Topics  . Smoking status: Never Smoker   . Smokeless tobacco: Never Used  . Alcohol Use: No  . Drug Use: No  . Sexual Activity: No   Other Topics Concern  . 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    Agent orange exposure    Family History: Family History  Problem Relation Age of Onset  . Heart disease Mother   . Cancer Mother   . Diabetes Sister   . Diabetes Sister   . Cancer Brother     prostate  . Prostate cancer Brother   . Hypothyroidism Brother   . Depression Neg Hx   . Alcohol abuse Neg Hx   . Drug abuse Neg Hx   . Stroke Neg Hx   . Colon cancer Neg Hx     Allergies: Allergies  Allergen Reactions  . Sulfonamide Derivatives     REACTION: lethargic    Current Facility-Administered Medications  Medication Dose Route Frequency Provider Last Rate Last Dose  . 0.9 %  sodium chloride infusion   Intravenous Continuous Shanda Howells, MD      . heparin injection 5,000 Units  5,000 Units Subcutaneous 3 times per day Shanda Howells, MD      . morphine 4 MG/ML injection 4 mg  4 mg Intravenous Q2H PRN Shanda Howells, MD      . ondansetron [facility]) tablet 4 mg  4 mg Oral Q6H PRN Shanda Howells, MD       Or  . ondansetron [facility]) injection 4 mg  4 mg Intravenous Q6H PRN Shanda Howells, MD      . piperacillin-tazobactam (ZOSYN) IVPB 3.375 g  3.375 g Intravenous Once Veryl Speak, MD      . sodium chloride 0.9 % bolus 1,000 mL  1,000 mL Intravenous Once Veryl Speak, MD      . vancomycin (VANCOCIN) IVPB 1000 mg/200 mL premix  1,000 mg Intravenous Once Veryl Speak, MD       Current Outpatient Prescriptions  Medication Sig Dispense Refill  . allopurinol (ZYLOPRIM) 300 MG tablet Take 300 mg by mouth daily.      Marland Kitchen atorvastatin (LIPITOR) 40 MG tablet Take 40 mg by mouth daily.      . carvedilol (COREG CR) 20 MG 24 hr capsule Take 1 capsule (20 mg total) by mouth daily.  90 capsule  0  . esomeprazole (NEXIUM) 40 MG capsule Daily as needed      . lisinopril (PRINIVIL,ZESTRIL) 40 MG tablet Take 40 mg by mouth daily. 1  TAB  EVERY DAY      . primidone (MYSOLINE) 50 MG tablet Take 25 mg by mouth daily.       Review Of Systems: 12 point ROS negative  except as noted above in HPI.  Physical Exam: Filed Vitals:   12/05/13 1406  BP: 121/69  Pulse: 89  Temp: 97.5 F (36.4 C)  Resp: 24    General: alert and cooperative HEENT: PERRLA and extra ocular movement intact Heart: S1, S2 normal, no murmur, rub or gallop, regular rate and rhythm Lungs: clear to auscultation, no wheezes or rales and unlabored breathing Abdomen: obese, abdomen, non tender; no perotineal signs currently s/p IV morphine  Extremities: extremities normal,  atraumatic, no cyanosis or edema Skin:no rashes, no ecchymoses Neurology: normal without focal findings  Labs and Imaging: Lab Results  Component Value Date/Time   NA 134* 12/05/2013  2:52 PM   K 4.7 12/05/2013  2:52 PM   CL 93* 12/05/2013  2:52 PM   CO2 19 12/05/2013  2:52 PM   BUN 22 12/05/2013  2:52 PM   CREATININE 2.35* 12/05/2013  2:52 PM   GLUCOSE 232* 12/05/2013  2:52 PM    Lab Results  Component Value Date   WBC 42.8* 12/05/2013   HGB 13.1 12/05/2013   HCT 36.8* 12/05/2013   MCV 96.3 12/05/2013   PLT 327 12/05/2013    Ct Abdomen Pelvis Wo Contrast  12/05/2013   CLINICAL DATA:  Abdominal pain and vomiting.  Weakness.  EXAM: CT ABDOMEN AND PELVIS WITHOUT CONTRAST  TECHNIQUE: Multidetector CT imaging of the abdomen and pelvis was performed following the standard protocol without intravenous contrast.  COMPARISON:  CT ABDOMEN W/O CM dated 01/28/2006  FINDINGS: Lung Bases: Right-greater-than-left basilar atelectasis.  Liver: Low attenuation is present in the right hepatic lobe adjacent to the gallbladder fossa, suspicious for hepatic edema. Focal fatty infiltration can also have this appearance.  Spleen:  Old granulomatous disease.  Gallbladder: Hydropic gallbladder, with calcified gallstones in the gallbladder neck. Suspect tiny calcified stone in the proximal common bile duct. Mild stranding is present around the gallbladder, again supporting a diagnosis of acute cholecystitis. Gallbladder ultrasound recommended  for further assessment.  Common bile duct: Possible tiny gallstone in the proximal common bile duct. Distal common bile duct within normal limits for age.  Pancreas:  Normal.  Adrenal glands:  Normal.  Kidneys: Low-density lesions are present in the kidneys compatible with hepatic cysts. The ureters appear within normal limits.  Stomach: Moderate hiatal hernia. Contrast present in the distal esophagus an hernia. Stomach is distended with contrast.  Small bowel: Duodenum appears normal. Small bowel is normal. No mesenteric adenopathy.  Colon: Normal appendix. Colonic diverticulosis. There is no diverticulitis.  Pelvic Genitourinary: Urinary bladder within normal limits. Prostatomegaly with 52 mm transverse prostate span. No free fluid.  Bones: Posterior lumbar interbody fusion. No aggressive osseous lesions.  Vasculature: Atherosclerosis without gross acute vascular abnormality.  Body Wall: Tiny fat containing periumbilical hernia.  IMPRESSION: 1. Hydropic gallbladder with calcified gallstones and mild stranding suspicious for acute cholecystitis. Consider correlation with ultrasound. 2. Possible tiny calcified stone in the proximal common bile duct. 3. Low attenuation in the liver adjacent to the gallbladder fossa may represent hepatic edema in the setting of cholecystitis or focal fatty infiltration. 4. Small hiatal hernia. 5. Renal cysts.   Electronically Signed   By: Dereck Ligas M.D.   On: 12/05/2013 17:37     Assessment and Plan: MAKSIM SICOTTE is a 80 y.o. year old male presenting with cholecystitis, sepsis   Sepsis: Likely secondary to cholecystitis. Pending formal surgery consult. Placed on Vancocin and Zosyn for broad-spectrum coverage. Blood and urine cultures. Systolic blood pressures have remained in the 90s by IV fluids. Given age and comorbidities, doubt the patient has a significant physiological reserve. Critical care consultation by ER physician. Step down. Also consult cards for  pre-operative clearance per Surgery request, though noted to be medically optimized from recent documentation. Also noted possible CBD stone. ? Coordination with GI. F/u surgery recs. Trend LFTs.  CAD/HTN: no active CP. LLN BPs currently. Hold antihypertensives. Cards consult pending OR.   Leukocytosis: likely secondary to biliary process. Pan culture. Consider peripheral  smear if WBC elevated despite treatment.   AKI: likely secondary to dehydration in setting of ACE use. Hold lisinopril. Hydrate. Reassess in am.  DM: SSI. A1C.   FEN/GI: NPO. PPI Prophylaxis: sub q heparin  Disposition: pending further evaluation  Code Status:Full Code        Shanda Howells MD  Pager: [phone]

## 2013-12-05 NOTE — Consult Note (Signed)
PULMONARY / CRITICAL CARE MEDICINE   Name: [name] [name] MRN: [id number] DOB: 1929/03/13    ADMISSION DATE:  12/05/2013 CONSULTATION DATE: 12/05/13  REFERRING MD :  Shanda Howells, MD PRIMARY SERVICE: Family Medicine  CHIEF COMPLAINT:  RUQ pain, sepsis  BRIEF PATIENT DESCRIPTION:  80 years old functional man with PMH relevant for DM, HTN, CAD post CABG, CKD, presents with cholecystitis and sepsis.  SIGNIFICANT EVENTS / STUDIES:  CT scan of the abdomen:  IMPRESSION:  1. Hydropic gallbladder with calcified gallstones and mild stranding  suspicious for acute cholecystitis. Consider correlation with  ultrasound.  2. Possible tiny calcified stone in the proximal common bile duct.  3. Low attenuation in the liver adjacent to the gallbladder fossa  may represent hepatic edema in the setting of cholecystitis or focal  fatty infiltration.  4. Small hiatal hernia.  5. Renal cysts.    LINES / TUBES: - Peripheral IV's  CULTURES: - Blood cultures sent  ANTIBIOTICS: - Zosyn  - Vancomycin  HISTORY OF PRESENT ILLNESS:   80 years old functional man with PMH relevant for DM, HTN, CAD post CABG, CKD, presents with 2 weeks history of weakness, malaise, nausea, vomiting and RUQ pain. At admission to the ED found to have borderline hypotension with partial response to IVF's, elevated WBC and slight tachycardia. CT scan of the abdomen without contrast is suggestive of acute cholecystitis. At the time of my examination the patient is awake, alert, oriented x 3 and with a BP of 90/60 and saturating 95% on RA. Not on any distress. Denies any other symptoms including CP, SOB. Cough, sputum production, diarrhea, urinary symptoms. Lactate is pending. Surgical consult was called by ED physician.   PAST MEDICAL HISTORY :  Past Medical History  Diagnosis Date  . Diabetes mellitus   . Hypertension 1995  . Hyperlipidemia 12/2005  . Benign essential tremor 2005  . Gout 1995  . GERD  (gastroesophageal reflux disease)   . Coronary artery disease     s/p CABG, DR Johnsie Cancel  . Glomerulonephritis 1965    3 months "Rest"  . Prostate cancer     S/P treatment, followed by Urology   Past Surgical History  Procedure Laterality Date  . Abdominal cat  02/15/2004    (with and without) - Left renal cyst, 3-4 cm  . Cystoscopy  02/15/2004    Mod BPH, moder tribec ?  . Cryotherapy of prostate  09/18/2005    2nd, prostate CA  . Adenosine myoview  01/24/2006    Ischemia by EKG, Cath  . Abdominal cat  01/25/2006    Negative AAA  . Coronary artery bypass graft    . Coronary artery bypass graft  01/31/2006    X 4  . Lumbar fusion     Prior to Admission medications   Medication Sig Start Date End Date Taking? Authorizing Provider  allopurinol (ZYLOPRIM) 300 MG tablet Take 300 mg by mouth daily.   Yes Historical Provider, MD  atorvastatin (LIPITOR) 40 MG tablet Take 40 mg by mouth daily.   Yes Historical Provider, MD  carvedilol (COREG CR) 20 MG 24 hr capsule Take 1 capsule (20 mg total) by mouth daily. 09/21/13  Yes Tonia Ghent, MD  esomeprazole (NEXIUM) 40 MG capsule Daily as needed 08/17/13  Yes Tonia Ghent, MD  lisinopril (PRINIVIL,ZESTRIL) 40 MG tablet Take 40 mg by mouth daily. 1  TAB  EVERY DAY 08/03/13  Yes Josue Hector, MD  primidone (MYSOLINE) 50 MG tablet  Take 25 mg by mouth daily.   Yes Historical Provider, MD   Allergies  Allergen Reactions  . Sulfonamide Derivatives     REACTION: lethargic    FAMILY HISTORY:  Family History  Problem Relation Age of Onset  . Heart disease Mother   . Cancer Mother   . Diabetes Sister   . Diabetes Sister   . Cancer Brother     prostate  . Prostate cancer Brother   . Hypothyroidism Brother   . Depression Neg Hx   . Alcohol abuse Neg Hx   . Drug abuse Neg Hx   . Stroke Neg Hx   . Colon cancer Neg Hx    SOCIAL HISTORY:  reports that he has never smoked. He has never used smokeless tobacco. He reports that he does  not drink alcohol or use illicit drugs.  REVIEW OF SYSTEMS:  All systems reviewed and found negative except for what I mentioned in the HPI.   SUBJECTIVE:   VITAL SIGNS: Temp:  [97.5 F (36.4 C)] 97.5 F (36.4 C) (03/29 1406) Pulse Rate:  [82-89] 82 (03/29 2000) Resp:  [24-33] 28 (03/29 1800) BP: (95-121)/(25-78) 96/54 mmHg (03/29 2000) SpO2:  [96 %-100 %] 96 % (03/29 2000) HEMODYNAMICS:   VENTILATOR SETTINGS:   INTAKE / OUTPUT: Intake/Output   None     PHYSICAL EXAMINATION: General: Pleasant male patient in no acute distress. Eyes: Anicteric sclerae. ENT: Oropharynx clear. Dry mucous membranes. No thrush Lymph: No cervical, supraclavicular, or axillary lymphadenopathy. Heart: Normal S1, S2. No murmurs, rubs, or gallops appreciated. No bruits, equal pulses. Lungs: Normal excursion, no dullness to percussion. Good air movement bilaterally, without wheezes or crackles. Normal upper airway sounds without evidence of stridor. Abdomen: Abdomen soft, non-tender and not distended, normoactive bowel sounds. No hepatosplenomegaly or masses. Musculoskeletal: No clubbing or synovitis. Skin: No rashes or lesions Neuro: No focal neurologic deficits.  LABS:  CBC  Recent Labs Lab 12/05/13 1452  WBC 42.8*  HGB 13.1  HCT 36.8*  PLT 327   Coag's No results found for this basename: APTT, INR,  in the last 168 hours BMET  Recent Labs Lab 12/05/13 1452  NA 134*  K 4.7  CL 93*  CO2 19  BUN 22  CREATININE 2.35*  GLUCOSE 232*   Electrolytes  Recent Labs Lab 12/05/13 1452  CALCIUM 9.5   Sepsis Markers No results found for this basename: LATICACIDVEN, PROCALCITON, O2SATVEN,  in the last 168 hours ABG No results found for this basename: PHART, PCO2ART, PO2ART,  in the last 168 hours Liver Enzymes  Recent Labs Lab 12/05/13 1452  AST 54*  ALT 64*  ALKPHOS 270*  BILITOT 1.5*  ALBUMIN 2.3*   Cardiac Enzymes No results found for this basename: TROPONINI, PROBNP,   in the last 168 hours Glucose No results found for this basename: GLUCAP,  in the last 168 hours  Imaging Ct Abdomen Pelvis Wo Contrast  12/05/2013   CLINICAL DATA:  Abdominal pain and vomiting.  Weakness.  EXAM: CT ABDOMEN AND PELVIS WITHOUT CONTRAST  TECHNIQUE: Multidetector CT imaging of the abdomen and pelvis was performed following the standard protocol without intravenous contrast.  COMPARISON:  CT ABDOMEN W/O CM dated 01/28/2006  FINDINGS: Lung Bases: Right-greater-than-left basilar atelectasis.  Liver: Low attenuation is present in the right hepatic lobe adjacent to the gallbladder fossa, suspicious for hepatic edema. Focal fatty infiltration can also have this appearance.  Spleen:  Old granulomatous disease.  Gallbladder: Hydropic gallbladder, with calcified gallstones in the  gallbladder neck. Suspect tiny calcified stone in the proximal common bile duct. Mild stranding is present around the gallbladder, again supporting a diagnosis of acute cholecystitis. Gallbladder ultrasound recommended for further assessment.  Common bile duct: Possible tiny gallstone in the proximal common bile duct. Distal common bile duct within normal limits for age.  Pancreas:  Normal.  Adrenal glands:  Normal.  Kidneys: Low-density lesions are present in the kidneys compatible with hepatic cysts. The ureters appear within normal limits.  Stomach: Moderate hiatal hernia. Contrast present in the distal esophagus an hernia. Stomach is distended with contrast.  Small bowel: Duodenum appears normal. Small bowel is normal. No mesenteric adenopathy.  Colon: Normal appendix. Colonic diverticulosis. There is no diverticulitis.  Pelvic Genitourinary: Urinary bladder within normal limits. Prostatomegaly with 52 mm transverse prostate span. No free fluid.  Bones: Posterior lumbar interbody fusion. No aggressive osseous lesions.  Vasculature: Atherosclerosis without gross acute vascular abnormality.  Body Wall: Tiny fat containing  periumbilical hernia.  IMPRESSION: 1. Hydropic gallbladder with calcified gallstones and mild stranding suspicious for acute cholecystitis. Consider correlation with ultrasound. 2. Possible tiny calcified stone in the proximal common bile duct. 3. Low attenuation in the liver adjacent to the gallbladder fossa may represent hepatic edema in the setting of cholecystitis or focal fatty infiltration. 4. Small hiatal hernia. 5. Renal cysts.   Electronically Signed   By: Dereck Ligas M.D.   On: 12/05/2013 17:37     CXR:   ASSESSMENT / PLAN:  PULMONARY A: 1) No issues P:   - Saturating 95% on RA  CARDIOVASCULAR A:  1) Borderline hypotension with some response to IVF's No need for pressors now 2) Hypotension likely secondary to sepsis with the most likely source being cholecystitis P:  - Awaiting lactate - No need for pressors now. Patient can be admitted to [place] unit by Medicine team. If he deteriorates we will take him to the ICU.  - Continue IVF resuscitation  RENAL A:   1) Acute on chronic renal failure, likely prerenal P:   - Will follow chemistry - Continue IVF resuscitation  GASTROINTESTINAL A:   1) Acute cholecystitis P:   - Surgical input appreciated - Will follow LFT's - Agree with Zosyn / vancomycin - NPO for possible ERCP in am  - Will eventually need cholecystectomy  HEMATOLOGIC A:   1) No issues P:  - Will follow CBC  INFECTIOUS A:   1) Acute cholecystitis 2) Sepsis P:   - Will follow blood cultures - Zosyn - Vancomycin - Surgery / GI consults for source control  ENDOCRINE A:   1) DM  P:   - Novolog sliding scale  NEUROLOGIC A:   1) No issues   TODAY'S SUMMARY:   I have personally obtained a history, examined the patient, evaluated laboratory and imaging results, formulated the assessment and plan and placed orders. CRITICAL CARE: Critical Care Time devoted to patient care services described in this note is 60 minutes.   Waynetta Pean,  MD Pulmonary and [facility] Pager: [phone]  12/05/2013, 8:25 PM

## 2013-12-05 NOTE — ED Provider Notes (Signed)
Care assumed from Dr. Winfred Leeds at shift change awaiting results of CT scan. Patient is an 80 year old male with history of coronary artery disease status post bypass surgery, hypertension, diabetes. He presents with weakness and abdominal pain. Workup reveals a white count of 42,000 with 20% bands and acute renal failure. A CT scan was performed and revealed evidence for acute cholecystitis. He was given vancomycin and Zosyn. I have consulted Dr. Georgette Dover from general surgery who would like the patient admitted to medicine. The hospitalist agrees to admit the would like consultation with critical care. I have updated critical care is or the patient and they will evaluate him as well. His blood pressure has been in the 42-876 systolic range and due to his leukocytosis and bandemia, blood cultures have been obtained. She will be admitted to the hospitalist service.  Veryl Speak, MD 12/05/13 2026

## 2013-12-06 ENCOUNTER — Encounter (HOSPITAL_COMMUNITY)
Admission: EM | Disposition: A | Discharge status: Skilled Nursing Facility | Payer: Medicare Other | Source: Home / Self Care | Attending: Internal Medicine

## 2013-12-06 ENCOUNTER — Encounter (HOSPITAL_COMMUNITY): Payer: Self-pay | Admitting: Physician Assistant

## 2013-12-06 ENCOUNTER — Inpatient Hospital Stay (HOSPITAL_COMMUNITY): Payer: Medicare Other

## 2013-12-06 ENCOUNTER — Telehealth: Payer: Self-pay | Admitting: Family Medicine

## 2013-12-06 ENCOUNTER — Encounter (HOSPITAL_COMMUNITY): Payer: Medicare Other | Admitting: Anesthesiology

## 2013-12-06 ENCOUNTER — Inpatient Hospital Stay (HOSPITAL_COMMUNITY): Payer: Medicare Other | Admitting: Anesthesiology

## 2013-12-06 DIAGNOSIS — N183 Chronic kidney disease, stage 3 unspecified: Secondary | ICD-10-CM | POA: Diagnosis present

## 2013-12-06 DIAGNOSIS — N179 Acute kidney failure, unspecified: Secondary | ICD-10-CM | POA: Diagnosis present

## 2013-12-06 DIAGNOSIS — I2510 Atherosclerotic heart disease of native coronary artery without angina pectoris: Secondary | ICD-10-CM

## 2013-12-06 DIAGNOSIS — I959 Hypotension, unspecified: Secondary | ICD-10-CM | POA: Diagnosis present

## 2013-12-06 DIAGNOSIS — K8050 Calculus of bile duct without cholangitis or cholecystitis without obstruction: Secondary | ICD-10-CM

## 2013-12-06 DIAGNOSIS — K810 Acute cholecystitis: Secondary | ICD-10-CM

## 2013-12-06 DIAGNOSIS — Z01810 Encounter for preprocedural cardiovascular examination: Secondary | ICD-10-CM

## 2013-12-06 DIAGNOSIS — I452 Bifascicular block: Secondary | ICD-10-CM

## 2013-12-06 DIAGNOSIS — A419 Sepsis, unspecified organism: Principal | ICD-10-CM

## 2013-12-06 HISTORY — DX: Calculus of bile duct without cholangitis or cholecystitis without obstruction: K80.50

## 2013-12-06 HISTORY — DX: Acute kidney failure, unspecified: N17.9

## 2013-12-06 HISTORY — PX: CHOLECYSTECTOMY: SHX55

## 2013-12-06 HISTORY — PX: ERCP: SHX5425

## 2013-12-06 LAB — BLOOD GAS, ARTERIAL
ACID-BASE DEFICIT: 6.3 mmol/L — AB (ref 0.0–2.0)
Bicarbonate: 18.9 mEq/L — ABNORMAL LOW (ref 20.0–24.0)
DRAWN BY: 313941
Expiratory PAP: 8
FIO2: 0.6 %
INSPIRATORY PAP: 14
Mode: POSITIVE
O2 SAT: 98.5 %
PATIENT TEMPERATURE: 98.6
PCO2 ART: 39.8 mmHg (ref 35.0–45.0)
RATE: 15 resp/min
TCO2: 20.2 mmol/L (ref 0–100)
pH, Arterial: 7.299 — ABNORMAL LOW (ref 7.350–7.450)
pO2, Arterial: 135 mmHg — ABNORMAL HIGH (ref 80.0–100.0)

## 2013-12-06 LAB — COMPREHENSIVE METABOLIC PANEL
ALT: 54 U/L — AB (ref 0–53)
AST: 47 U/L — ABNORMAL HIGH (ref 0–37)
Albumin: 2 g/dL — ABNORMAL LOW (ref 3.5–5.2)
Alkaline Phosphatase: 227 U/L — ABNORMAL HIGH (ref 39–117)
BUN: 28 mg/dL — ABNORMAL HIGH (ref 6–23)
CALCIUM: 8.2 mg/dL — AB (ref 8.4–10.5)
CO2: 17 mEq/L — ABNORMAL LOW (ref 19–32)
CREATININE: 2.11 mg/dL — AB (ref 0.50–1.35)
Chloride: 102 mEq/L (ref 96–112)
GFR, EST AFRICAN AMERICAN: 32 mL/min — AB (ref >90)
GFR, EST NON AFRICAN AMERICAN: 28 mL/min — AB (ref >90)
GLUCOSE: 85 mg/dL (ref 70–99)
Potassium: 5.1 mEq/L (ref 3.7–5.3)
SODIUM: 136 meq/L — AB (ref 137–147)
Total Bilirubin: 1.5 mg/dL — ABNORMAL HIGH (ref 0.3–1.2)
Total Protein: 6.1 g/dL (ref 6.0–8.3)

## 2013-12-06 LAB — URINE MICROSCOPIC-ADD ON

## 2013-12-06 LAB — URINALYSIS, ROUTINE W REFLEX MICROSCOPIC
Glucose, UA: NEGATIVE mg/dL
Hgb urine dipstick: NEGATIVE
Ketones, ur: NEGATIVE mg/dL
LEUKOCYTES UA: NEGATIVE
NITRITE: NEGATIVE
PH: 5.5 (ref 5.0–8.0)
Protein, ur: 100 mg/dL — AB
SPECIFIC GRAVITY, URINE: 1.016 (ref 1.005–1.030)
Urobilinogen, UA: 0.2 mg/dL (ref 0.0–1.0)

## 2013-12-06 LAB — CBC WITH DIFFERENTIAL/PLATELET
BASOS ABS: 0 10*3/uL (ref 0.0–0.1)
BASOS PCT: 0 % (ref 0–1)
EOS ABS: 0 10*3/uL (ref 0.0–0.7)
Eosinophils Relative: 0 % (ref 0–5)
HCT: 30.1 % — ABNORMAL LOW (ref 39.0–52.0)
HEMOGLOBIN: 10.6 g/dL — AB (ref 13.0–17.0)
Lymphocytes Relative: 1 % — ABNORMAL LOW (ref 12–46)
Lymphs Abs: 0.3 10*3/uL — ABNORMAL LOW (ref 0.7–4.0)
MCH: 33.2 pg (ref 26.0–34.0)
MCHC: 35.2 g/dL (ref 30.0–36.0)
MCV: 94.4 fL (ref 78.0–100.0)
Monocytes Absolute: 0.2 10*3/uL (ref 0.1–1.0)
Monocytes Relative: 1 % — ABNORMAL LOW (ref 3–12)
NEUTROS PCT: 98 % — AB (ref 43–77)
Neutro Abs: 22.8 10*3/uL — ABNORMAL HIGH (ref 1.7–7.7)
PLATELETS: 271 10*3/uL (ref 150–400)
RBC: 3.19 MIL/uL — ABNORMAL LOW (ref 4.22–5.81)
RDW: 14.3 % (ref 11.5–15.5)
WBC: 23.3 10*3/uL — ABNORMAL HIGH (ref 4.0–10.5)

## 2013-12-06 LAB — GLUCOSE, CAPILLARY
GLUCOSE-CAPILLARY: 133 mg/dL — AB (ref 70–99)
Glucose-Capillary: 115 mg/dL — ABNORMAL HIGH (ref 70–99)
Glucose-Capillary: 126 mg/dL — ABNORMAL HIGH (ref 70–99)
Glucose-Capillary: 121 mg/dL — ABNORMAL HIGH (ref 70–99)
Glucose-Capillary: 153 mg/dL — ABNORMAL HIGH (ref 70–99)

## 2013-12-06 LAB — LIPASE, BLOOD: Lipase: 15 U/L (ref 11–59)

## 2013-12-06 SURGERY — ERCP, WITH INTERVENTION IF INDICATED
Anesthesia: General | Site: Abdomen

## 2013-12-06 MED ORDER — ONDANSETRON HCL 4 MG PO TABS
4.0000 mg | ORAL_TABLET | ORAL | Status: DC | PRN
  Administered 2013-12-12: 4 mg via ORAL
  Filled 2013-12-06: qty 1

## 2013-12-06 MED ORDER — LIDOCAINE HCL (CARDIAC) 20 MG/ML IV SOLN
INTRAVENOUS | Status: DC | PRN
  Administered 2013-12-06: 30 mg via INTRAVENOUS

## 2013-12-06 MED ORDER — PHENYLEPHRINE 40 MCG/ML (10ML) SYRINGE FOR IV PUSH (FOR BLOOD PRESSURE SUPPORT)
PREFILLED_SYRINGE | INTRAVENOUS | Status: AC
  Filled 2013-12-06: qty 10

## 2013-12-06 MED ORDER — OXYCODONE-ACETAMINOPHEN 5-325 MG PO TABS: 1.0000 | ORAL_TABLET | ORAL | Status: DC | PRN

## 2013-12-06 MED ORDER — LIDOCAINE HCL (CARDIAC) 20 MG/ML IV SOLN
INTRAVENOUS | Status: AC
  Filled 2013-12-06: qty 5

## 2013-12-06 MED ORDER — BUPIVACAINE-EPINEPHRINE (PF) 0.25% -1:200000 IJ SOLN
INTRAMUSCULAR | Status: AC
  Filled 2013-12-06: qty 30

## 2013-12-06 MED ORDER — NEOSTIGMINE METHYLSULFATE 1 MG/ML IJ SOLN
INTRAMUSCULAR | Status: DC | PRN
  Administered 2013-12-06: 4 mg via INTRAVENOUS

## 2013-12-06 MED ORDER — MORPHINE SULFATE 2 MG/ML IJ SOLN
1.0000 mg | INTRAMUSCULAR | Status: DC | PRN
  Administered 2013-12-06 – 2013-12-09 (×17): 2 mg via INTRAVENOUS
  Administered 2013-12-09: 4 mg via INTRAVENOUS
  Administered 2013-12-10 – 2013-12-11 (×3): 2 mg via INTRAVENOUS
  Administered 2013-12-11: 4 mg via INTRAVENOUS
  Administered 2013-12-11 – 2013-12-12 (×2): 2 mg via INTRAVENOUS
  Administered 2013-12-12: 4 mg via INTRAVENOUS
  Administered 2013-12-12 – 2013-12-13 (×4): 2 mg via INTRAVENOUS
  Filled 2013-12-06 (×5): qty 1
  Filled 2013-12-06: qty 2
  Filled 2013-12-06 (×6): qty 1
  Filled 2013-12-06: qty 2
  Filled 2013-12-06 (×18): qty 1
  Filled 2013-12-06: qty 2

## 2013-12-06 MED ORDER — FENTANYL CITRATE 0.05 MG/ML IJ SOLN
INTRAMUSCULAR | Status: AC
  Filled 2013-12-06: qty 5

## 2013-12-06 MED ORDER — GLYCOPYRROLATE 0.2 MG/ML IJ SOLN
INTRAMUSCULAR | Status: DC | PRN
  Administered 2013-12-06: .6 mg via INTRAVENOUS

## 2013-12-06 MED ORDER — CARVEDILOL PHOSPHATE ER 20 MG PO CP24
20.0000 mg | ORAL_CAPSULE | Freq: Every day | ORAL | Status: DC
  Administered 2013-12-06 – 2013-12-15 (×10): 20 mg via ORAL
  Filled 2013-12-06 (×12): qty 1

## 2013-12-06 MED ORDER — EPHEDRINE SULFATE 50 MG/ML IJ SOLN
INTRAMUSCULAR | Status: DC | PRN
  Administered 2013-12-06 (×2): 10 mg via INTRAVENOUS

## 2013-12-06 MED ORDER — ROCURONIUM BROMIDE 100 MG/10ML IV SOLN
INTRAVENOUS | Status: DC | PRN
Start: 2013-12-06 — End: 2013-12-06
  Administered 2013-12-06: 20 mg via INTRAVENOUS
  Administered 2013-12-06: 10 mg via INTRAVENOUS
  Administered 2013-12-06: 40 mg via INTRAVENOUS

## 2013-12-06 MED ORDER — ALBUMIN HUMAN 5 % IV SOLN
INTRAVENOUS | Status: DC | PRN
  Administered 2013-12-06: 15:00:00 via INTRAVENOUS

## 2013-12-06 MED ORDER — ONDANSETRON HCL 4 MG/2ML IJ SOLN
INTRAMUSCULAR | Status: DC | PRN
  Administered 2013-12-06: 4 mg via INTRAVENOUS

## 2013-12-06 MED ORDER — SODIUM CHLORIDE 0.9 % IV BOLUS (SEPSIS)
250.0000 mL | Freq: Once | INTRAVENOUS | Status: AC
  Administered 2013-12-06: 250 mL via INTRAVENOUS

## 2013-12-06 MED ORDER — PIPERACILLIN-TAZOBACTAM 3.375 G IVPB
3.3750 g | Freq: Three times a day (TID) | INTRAVENOUS | Status: DC
Start: 2013-12-06 — End: 2013-12-14
  Administered 2013-12-06 – 2013-12-14 (×25): 3.375 g via INTRAVENOUS
  Filled 2013-12-06 (×32): qty 50

## 2013-12-06 MED ORDER — HYDROMORPHONE HCL PF 1 MG/ML IJ SOLN
0.2500 mg | INTRAMUSCULAR | Status: DC | PRN
  Administered 2013-12-06: 0.5 mg via INTRAVENOUS

## 2013-12-06 MED ORDER — EPHEDRINE SULFATE 50 MG/ML IJ SOLN
INTRAMUSCULAR | Status: AC
  Filled 2013-12-06: qty 1

## 2013-12-06 MED ORDER — SODIUM CHLORIDE 0.9 % IR SOLN
Status: DC | PRN
  Administered 2013-12-06: 1000 mL

## 2013-12-06 MED ORDER — SODIUM CHLORIDE 0.9 % IV SOLN
INTRAVENOUS | Status: DC | PRN
  Administered 2013-12-06: 13:00:00

## 2013-12-06 MED ORDER — PHENYLEPHRINE HCL 10 MG/ML IJ SOLN
INTRAMUSCULAR | Status: DC | PRN
  Administered 2013-12-06 (×2): 80 ug via INTRAVENOUS
  Administered 2013-12-06: 120 ug via INTRAVENOUS

## 2013-12-06 MED ORDER — PIPERACILLIN-TAZOBACTAM 3.375 G IVPB 30 MIN: 3.3750 g | Freq: Four times a day (QID) | INTRAVENOUS | Status: DC

## 2013-12-06 MED ORDER — LACTATED RINGERS IV SOLN
INTRAVENOUS | Status: DC | PRN
  Administered 2013-12-06 (×2): via INTRAVENOUS

## 2013-12-06 MED ORDER — ONDANSETRON HCL 4 MG/2ML IJ SOLN
4.0000 mg | Freq: Four times a day (QID) | INTRAMUSCULAR | Status: DC | PRN
  Administered 2013-12-08 – 2013-12-13 (×9): 4 mg via INTRAVENOUS
  Filled 2013-12-06 (×9): qty 2

## 2013-12-06 MED ORDER — PROPOFOL 10 MG/ML IV BOLUS
INTRAVENOUS | Status: AC
  Filled 2013-12-06: qty 20

## 2013-12-06 MED ORDER — 0.9 % SODIUM CHLORIDE (POUR BTL) OPTIME
TOPICAL | Status: DC | PRN
  Administered 2013-12-06: 1000 mL

## 2013-12-06 MED ORDER — FENTANYL CITRATE 0.05 MG/ML IJ SOLN
INTRAMUSCULAR | Status: DC | PRN
  Administered 2013-12-06 (×3): 25 ug via INTRAVENOUS
  Administered 2013-12-06: 75 ug via INTRAVENOUS

## 2013-12-06 MED ORDER — BUPIVACAINE-EPINEPHRINE 0.25% -1:200000 IJ SOLN
INTRAMUSCULAR | Status: DC | PRN
  Administered 2013-12-06: 30 mL

## 2013-12-06 MED ORDER — ONDANSETRON HCL 4 MG/2ML IJ SOLN
INTRAMUSCULAR | Status: AC
  Filled 2013-12-06: qty 2

## 2013-12-06 MED ORDER — PROPOFOL 10 MG/ML IV BOLUS
INTRAVENOUS | Status: DC | PRN
  Administered 2013-12-06: 70 mg via INTRAVENOUS

## 2013-12-06 MED ORDER — ROCURONIUM BROMIDE 50 MG/5ML IV SOLN
INTRAVENOUS | Status: AC
  Filled 2013-12-06: qty 1

## 2013-12-06 MED ORDER — HYDROMORPHONE HCL PF 1 MG/ML IJ SOLN
INTRAMUSCULAR | Status: AC
  Filled 2013-12-06: qty 1

## 2013-12-06 SURGICAL SUPPLY — 45 items
APPLIER CLIP 5 13 M/L LIGAMAX5 (MISCELLANEOUS) ×4
BLADE SURG ROTATE 9660 (MISCELLANEOUS) IMPLANT
CANISTER SUCTION 2500CC (MISCELLANEOUS) ×4 IMPLANT
CHLORAPREP W/TINT 26ML (MISCELLANEOUS) ×4 IMPLANT
CLIP APPLIE 5 13 M/L LIGAMAX5 (MISCELLANEOUS) ×2 IMPLANT
COVER MAYO STAND STRL (DRAPES) ×4 IMPLANT
COVER SURGICAL LIGHT HANDLE (MISCELLANEOUS) ×4 IMPLANT
DECANTER SPIKE VIAL GLASS SM (MISCELLANEOUS) ×4 IMPLANT
DERMABOND ADVANCED (GAUZE/BANDAGES/DRESSINGS) ×2
DERMABOND ADVANCED .7 DNX12 (GAUZE/BANDAGES/DRESSINGS) ×2 IMPLANT
DRAIN CHANNEL 19F RND (DRAIN) ×8 IMPLANT
DRAPE C-ARM 42X72 X-RAY (DRAPES) ×4 IMPLANT
ELECT REM PT RETURN 9FT ADLT (ELECTROSURGICAL) ×4
ELECTRODE REM PT RTRN 9FT ADLT (ELECTROSURGICAL) ×2 IMPLANT
EVACUATOR SILICONE 100CC (DRAIN) ×8 IMPLANT
GLOVE BIO SURGEON STRL SZ7 (GLOVE) ×8 IMPLANT
GLOVE BIO SURGEON STRL SZ8 (GLOVE) ×4 IMPLANT
GLOVE BIOGEL PI IND STRL 7.0 (GLOVE) ×6 IMPLANT
GLOVE BIOGEL PI IND STRL 7.5 (GLOVE) ×4 IMPLANT
GLOVE BIOGEL PI INDICATOR 7.0 (GLOVE) ×6
GLOVE BIOGEL PI INDICATOR 7.5 (GLOVE) ×4
GLOVE ECLIPSE 7.0 STRL STRAW (GLOVE) ×4 IMPLANT
GLOVE SURG SS PI 7.0 STRL IVOR (GLOVE) ×8 IMPLANT
GOWN STRL REUS W/ TWL LRG LVL3 (GOWN DISPOSABLE) ×8 IMPLANT
GOWN STRL REUS W/TWL LRG LVL3 (GOWN DISPOSABLE) ×8
KIT BASIN OR (CUSTOM PROCEDURE TRAY) ×4 IMPLANT
KIT ROOM TURNOVER OR (KITS) ×4 IMPLANT
NS IRRIG 1000ML POUR BTL (IV SOLUTION) ×4 IMPLANT
PAD ARMBOARD 7.5X6 YLW CONV (MISCELLANEOUS) ×4 IMPLANT
POUCH RETRIEVAL ECOSAC 10 (ENDOMECHANICALS) ×2 IMPLANT
POUCH RETRIEVAL ECOSAC 10MM (ENDOMECHANICALS) ×2
SCISSORS LAP 5X35 DISP (ENDOMECHANICALS) ×4 IMPLANT
SET CHOLANGIOGRAPH 5 50 .035 (SET/KITS/TRAYS/PACK) ×4 IMPLANT
SET IRRIG TUBING LAPAROSCOPIC (IRRIGATION / IRRIGATOR) ×4 IMPLANT
SLEEVE ENDOPATH XCEL 5M (ENDOMECHANICALS) ×8 IMPLANT
SPECIMEN JAR SMALL (MISCELLANEOUS) ×4 IMPLANT
SPONGE GAUZE 4X4 12PLY (GAUZE/BANDAGES/DRESSINGS) ×8 IMPLANT
SUT ETHILON 2 0 FS 18 (SUTURE) ×8 IMPLANT
SUT MNCRL AB 4-0 PS2 18 (SUTURE) ×4 IMPLANT
TAPE CLOTH SURG 4X10 WHT LF (GAUZE/BANDAGES/DRESSINGS) ×4 IMPLANT
TOWEL OR 17X24 6PK STRL BLUE (TOWEL DISPOSABLE) ×4 IMPLANT
TOWEL OR 17X26 10 PK STRL BLUE (TOWEL DISPOSABLE) ×4 IMPLANT
TRAY LAPAROSCOPIC (CUSTOM PROCEDURE TRAY) ×4 IMPLANT
TROCAR XCEL BLUNT TIP 100MML (ENDOMECHANICALS) ×4 IMPLANT
TROCAR XCEL NON-BLD 5MMX100MML (ENDOMECHANICALS) ×4 IMPLANT

## 2013-12-06 NOTE — Transfer of Care (Signed)
Immediate Anesthesia Transfer of Care Note  Patient: [name] [name]  Procedure(s) Performed: Procedure(s): ENDOSCOPIC RETROGRADE CHOLANGIOPANCREATOGRAPHY (ERCP) (N/A) Diagnostic Laparoscopy for  drainage Intrabdominal abcess (N/A)  Patient Location: PACU  Anesthesia Type:General  Level of Consciousness: awake and lethargic  Airway & Oxygen Therapy: Patient Spontanous Breathing, Patient remains intubated per anesthesia plan and Patient placed on Ventilator (see vital sign flow sheet for setting)  Post-op Assessment: Report given to PACU RN and Post -op Vital signs reviewed and stable  Post vital signs: Reviewed and stable  Complications: No apparent anesthesia complications

## 2013-12-06 NOTE — Consult Note (Signed)
[facility] Consult: 8:29 AM 12/06/2013  LOS: 1 day    Referring Provider: Rolm Bookbinder MD Primary Care Physician:  Elsie Stain, MD Primary Gastroenterologist:  None, unassigned.      Reason for Consultation:  Cholangitis, choledocholithiasis.    HPI: [name] [name] is a 80 y.o. male.  Hx DM 2 (no meds for this as outpt). Hx prostate cancer, s/p cryotherapy 2007. CABG in 2007. Suffers from tremors.   On 3/5 had multiple teeth extracted, had to go back for repeat oral surgery due to (retained tooth?).  Mouth hurt and he stopped eating much.  2 weeks ago he developed epigastric pain and vomiting. Acutely this lasted 2 to 3 days.  Pain and anorexia persisted to present.  Pain attributed to a pulled muscle from retching.  It did not improve with doses of Ibuprofen 400 mg daily, nor with a few doses of Nexium.  Became progressively weaker.  Over weekend he had fallen due to weakness.  Brought to ED and diagnosed with sepsis, cholangitis. Received vanc and Zosyn in ED but no ongoing abx.   CT shows gallstones with likely cholecystitis, possible small stone in non-dilated CBD. ? Hepatic edema at GB fossa, ? Fatty liver. Alk phos 270, AST/ALT 54/64.  t bili 1.5.  Lipase normal.       Past Medical History  Diagnosis Date  . Diabetes mellitus   . Hypertension 1995  . Hyperlipidemia 12/2005  . Benign essential tremor 2005  . Gout 1995  . GERD (gastroesophageal reflux disease)   . Coronary artery disease     s/p CABG, DR Johnsie Cancel  . Glomerulonephritis 1965    3 months "Rest"  . Prostate cancer     S/P treatment, followed by Urology    Past Surgical History  Procedure Laterality Date  . Abdominal cat  02/15/2004    (with and without) - Left renal cyst, 3-4 cm  . Cystoscopy  02/15/2004    Mod BPH,  moder tribec ?  . Cryotherapy of prostate  09/18/2005    2nd, prostate CA  . Adenosine myoview  01/24/2006    Ischemia by EKG, Cath  . Abdominal cat  01/25/2006    Negative AAA  . Coronary artery bypass graft    . Coronary artery bypass graft  01/31/2006    X 4  . Lumbar fusion      Prior to Admission medications   Medication Sig Start Date End Date Taking? Authorizing Provider  allopurinol (ZYLOPRIM) 300 MG tablet Take 300 mg by mouth daily.   Yes Historical Provider, MD  atorvastatin (LIPITOR) 40 MG tablet Take 40 mg by mouth daily.   Yes Historical Provider, MD  carvedilol (COREG CR) 20 MG 24 hr capsule Take 1 capsule (20 mg total) by mouth daily. 09/21/13  Yes Tonia Ghent, MD  esomeprazole (NEXIUM) 40 MG capsule Daily as needed 08/17/13  Yes Tonia Ghent, MD  lisinopril (PRINIVIL,ZESTRIL) 40 MG tablet Take 40 mg by mouth daily. 1  TAB  EVERY DAY 08/03/13  Yes Josue Hector,  MD  primidone (MYSOLINE) 50 MG tablet Take 25 mg by mouth daily. Uses it only prn however   Yes Historical Provider, MD  Ibuprofen 400 mg                    Once daily.   Scheduled Meds: . heparin  5,000 Units Subcutaneous 3 times per day  . insulin aspart  0-5 Units Subcutaneous QHS  . insulin aspart  0-9 Units Subcutaneous TID WC   Infusions: . sodium chloride 100 mL/hr at 12/06/13 0050   PRN Meds: morphine injection, ondansetron (ZOFRAN) IV, ondansetron   Allergies as of 12/05/2013 - Review Complete 12/05/2013  Allergen Reaction Noted  . Sulfonamide derivatives  04/02/2007    Family History  Problem Relation Age of Onset  . Heart disease Mother   . Cancer Mother   . Diabetes Sister   . Diabetes Sister   . Cancer Brother     prostate  . Prostate cancer Brother   . Hypothyroidism Brother   . Depression Neg Hx   . Alcohol abuse Neg Hx   . Drug abuse Neg Hx   . Stroke Neg Hx   . Colon cancer Neg Hx     History   Social History  . Marital Status: Married    Spouse Name: N/A     Number of Children: 1  . Years of Education: N/A   Occupational History  . Retired Pharmacist, community    Social History Main Topics  . Smoking status: Never Smoker   . Smokeless tobacco: Never Used  . Alcohol Use: No  . Drug Use: No  . Sexual Activity: No   Other Topics Concern  . 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    Agent orange exposure    REVIEW OF SYSTEMS: Constitutional:  No weight loss.  Generally poor exercise tolerance ENT:  No nose bleeds Eyes :  Left cataract surgery in Feb 2015.  Pulm:  No cough.  + stable exertional dyspnea CV:  No palpitations, no LE edema.  GU:  No hematuria, no frequency GI:  Per HPI.  No hx dysphagia, no bleeding from mouth or rectume Heme:  No anemia   Transfusions:  None in past Neuro:  No headaches, no peripheral tingling or numbness.  + limb tremor.  Shakes a lot at night, neuro consult planned but other issues have prevented neuro eval.  Derm:  No itching, no rash or sores.  Endocrine: .  No polyuria or dysuria.  Previous oral diabetes agents, but now controls diabetes with diet.  Immunization:  Flu, pnemovax up to date.  Travel:  None beyond local counties in last few months.    PHYSICAL EXAM: Vital signs in last 24 hours: Filed Vitals:   12/06/13 0407  BP: 96/62  Pulse: 94  Temp: 100.4 F (38 C)  Resp: 31   Wt Readings from Last 3 Encounters:  12/06/13 89 kg (196 lb 3.4 oz)  11/24/13 86.977 kg (191 lb 12 oz)  10/21/13 91.286 kg (201 lb 4 oz)   General: tremulous, diaphoretic, ill looking eldery WM.  Currently comfortable Head:  No trauma or swelling  Eyes:  No icterus or pallor Ears:  Not HOH  Nose:  No discharge Mouth:  somewhat dry oral MM.  No ulcers, no teeth Neck:  No JVD, no TMG, no JVD Lungs:  Clear bil.  No dyspnea or cough Heart: RRR.  No MRG Abdomen:  Soft, NT, BS hypoactive, no mass, no bruits.   Rectal: deferred     Musc/Skeltl: no joint swelling, no contracture. Extremities:  No pedal edema  Neurologic:  Tremor in head, hands.  No limb weakness.  Oriented x3, alert. Skin:  No jaundice, no telangectasia.  No sores or rash Tattoos:  none Nodes:  No cervical adenopathy   Psych:  Cooperative, affect flat.   Intake/Output from previous day: 03/29 0701 - 03/30 0700 In: 1000 [I.V.:1000] Out: 270 [Urine:270] Intake/Output this shift:    LAB RESULTS:  Recent Labs  12/05/13 1452 12/06/13 0315  WBC 42.8* 23.3*  HGB 13.1 10.6*  HCT 36.8* 30.1*  PLT 327 271  MCV    94  BMET Lab Results  Component Value Date   NA 136* 12/06/2013   NA 134* 12/05/2013   NA 140 10/14/2013   K 5.1 12/06/2013   K 4.7 12/05/2013   K 4.5 10/14/2013   CL 102 12/06/2013   CL 93* 12/05/2013   CL 108 10/14/2013   CO2 17* 12/06/2013   CO2 19 12/05/2013   CO2 27 10/14/2013   GLUCOSE 85 12/06/2013   GLUCOSE 232* 12/05/2013   GLUCOSE 121* 10/14/2013   BUN 28* 12/06/2013   BUN 22 12/05/2013   BUN 19 10/14/2013   CREATININE 2.11* 12/06/2013   CREATININE 2.35* 12/05/2013   CREATININE 1.2 10/14/2013   CALCIUM 8.2* 12/06/2013   CALCIUM 9.5 12/05/2013   CALCIUM 9.4 10/14/2013   LFT  Recent Labs  12/05/13 1452 12/06/13 0315  PROT 7.2 6.1  ALBUMIN 2.3* 2.0*  AST 54* 47*  ALT 64* 54*  ALKPHOS 270* 227*  BILITOT 1.5* 1.5*   PT/INR Lab Results  Component Value Date   INR 1.02 02/20/2012   Lipase     Component Value Date/Time   LIPASE 22 12/05/2013 1452    RADIOLOGY STUDIES: Ct Abdomen Pelvis Wo Contrast 12/05/2013    FINDINGS: Lung Bases: Right-greater-than-left basilar atelectasis.  Liver: Low attenuation is present in the right hepatic lobe adjacent to the gallbladder fossa, suspicious for hepatic edema. Focal fatty infiltration can also have this appearance.  Spleen:  Old granulomatous disease.  Gallbladder: Hydropic gallbladder, with calcified gallstones in the gallbladder neck. Suspect tiny calcified stone in the proximal common  bile duct. Mild stranding is present around the gallbladder, again supporting a diagnosis of acute cholecystitis. Gallbladder ultrasound recommended for further assessment.  Common bile duct: Possible tiny gallstone in the proximal common bile duct. Distal common bile duct within normal limits for age.  Pancreas:  Normal.  Adrenal glands:  Normal.  Kidneys: Low-density lesions are present in the kidneys compatible with hepatic cysts. The ureters appear within normal limits.  Stomach: Moderate hiatal hernia. Contrast present in the distal esophagus an hernia. Stomach is distended with contrast.  Small bowel: Duodenum appears normal. Small bowel is normal. No mesenteric adenopathy.  Colon: Normal appendix. Colonic diverticulosis. There is no diverticulitis.  Pelvic Genitourinary: Urinary bladder within normal limits. Prostatomegaly with 52 mm transverse prostate span. No free fluid.  Bones: Posterior lumbar interbody fusion. No aggressive osseous lesions.  Vasculature: Atherosclerosis without gross acute vascular abnormality.  Body Wall: Tiny fat containing periumbilical hernia.  IMPRESSION: 1. Hydropic gallbladder with calcified gallstones and mild stranding suspicious for acute cholecystitis. Consider correlation with ultrasound. 2. Possible tiny calcified stone in the proximal common bile duct. 3. Low attenuation in the liver adjacent to the gallbladder fossa may  represent hepatic edema in the setting of cholecystitis or focal fatty infiltration. 4. Small hiatal hernia. 5. Renal cysts.   Electronically Signed   By: Dereck Ligas M.D.   On: 12/05/2013 17:37    ENDOSCOPIC STUDIES: Remote colonoscopy with a polyp, no records of procedure or pathology in epic.   IMPRESSION:   *  Choledocholithiasis with cholangitis, sepsis.   *  Acute cholecystitis.   *  DM.  Diet controlled at home.  Acutely elevated in setting of infection.   *  Normocytic anemia. In setting of aggressive fluid resuscitation.      PLAN:     *  ERCP.  Trying to arrange for early afternoon in OR *  Reordered ongoing Zosyn, did not reorder vancomycin.     Azucena Freed  12/06/2013, 8:29 AM Pager: [phone]    ________________________________________________________________________  Velora Heckler GI MD note:  I personally examined the patient, reviewed the data and agree with the assessment and plan described above.  Planning on ERCP prior to same setting lap chole in OR.  I reviewed CT scan, labs.  There is a good chance he has CBD stone based on CT findings.  I spoke with him, wife, daughter about risks (including pancreatitis) and benefits of ERCP. Will proceed.   Owens Loffler, MD [facility] Pager [phone]

## 2013-12-06 NOTE — Progress Notes (Signed)
Inpatient Diabetes Program Recommendations  AACE/ADA: New Consensus Statement on Inpatient Glycemic Control (2013)  Target Ranges:  Prepandial:   less than 140 mg/dL      Peak postprandial:   less than 180 mg/dL (1-2 hours)      Critically ill patients:  140 - 180 mg/dL   Inpatient Diabetes Program Recommendations Correction (SSI): Pt NPO fur surgery. May want to change correction to q 4hrs while NPO until tolerating po's.  Thank you, Rosita Kea, RN, CNS, Diabetes Coordinator [phone])

## 2013-12-06 NOTE — Op Note (Signed)
[facility] [place], [address]   ERCP PROCEDURE REPORT  PATIENT: [name] [name], [name] [name].  MR# :[id number] BIRTHDATE: 1929-05-31  GENDER: Male ENDOSCOPIST: Milus Banister, MD REFERRED BY: Rolm Bookbinder, M.D. PROCEDURE DATE:  12/06/2013 PROCEDURE:   ERCP with sphincterotomy/papillotomy and ERCP with removal of calculus/calculi ASA CLASS:   Class III INDICATIONS:admitted with probable acute cholecystitis, elevated liver tests, CT scan suggested stone in CBD. MEDICATIONS: General endotracheal anesthesia (GETA) TOPICAL ANESTHETIC: none  DESCRIPTION OF PROCEDURE:   After the risks benefits and alternatives of the procedure were thoroughly explained, informed consent was obtained.  The Pentax Ercp Scope [id number]  endoscope was introduced through the mouth  and advanced to the second portion of the duodenum without detailed examination of the UGI tract.  The major papilla was normal appearing.  A 44 hydrotome over a .035 hydrawire was used to cannulate the bile duct and contrast was injected. Cholangiogram showed non-dilated extrahepatic bile duct, patent cystic duct, partial filling of the GB. There was a suggestion of small (1-70mm) floating filling defect in the CBD and so an adequate biliary sphincterotomy was performed. The bile duct was then swept with balloon and 2-3 small stone particles were delivered into the duodenum. There was no purulence.  A completion, occlusion cholangiogram was normal.  The main pancreatic duct was never injected with dye or cannulated with the wire.  The scope was then completely withdrawn from the patient and the procedure terminated.     COMPLICATIONS: no immediate complications  ENDOSCOPIC IMPRESSION: Small CBD stones, stone particles.  Treated with biliary sphincterotomy and balloon sweeping.  RECOMMENDATIONS: He will be undergoing lap chole immediately following  this ERCP.   _______________________________ eSigned:  Milus Banister, MD 12/06/2013 2:29 PM

## 2013-12-06 NOTE — Anesthesia Preprocedure Evaluation (Addendum)
Anesthesia Evaluation  Patient identified by MRN, date of birth, ID band Patient awake    Reviewed: Allergy & Precautions, H&P , NPO status , Patient's Chart, lab work & pertinent test results, reviewed documented beta blocker date and time   Airway Mallampati: II TM Distance: >3 FB Neck ROM: Full    Dental no notable dental hx. (+) Edentulous Upper, Edentulous Lower, Dental Advisory Given   Pulmonary neg pulmonary ROS,  breath sounds clear to auscultation  Pulmonary exam normal       Cardiovascular hypertension, Pt. on medications and Pt. on home beta blockers - angina+ CAD and + CABG ('07) Rhythm:Regular Rate:Normal  '13 Abnormal stress nuclear study:  There is a small, partially reversible apical inferior and true apex perfusion defect suggestive of prior MI with peri-infarction ischemia.  This defect looks like more than just apical thinning.  LV Ejection Fraction: 62%.  LV Wall Motion:  NL LV Function; NL Wall Motion      Neuro/Psych negative neurological ROS  negative psych ROS   GI/Hepatic Neg liver ROS, GERD-  Medicated and Controlled,Elevated LFTs with acute cholecystitis   Endo/Other  diabetes (diet controlledglu 126)Hypothyroidism   Renal/GU Renal InsufficiencyRenal disease (creat 2.11, K+ 5.1)  negative genitourinary   Musculoskeletal   Abdominal   Peds  Hematology negative hematology ROS (+)   Anesthesia Other Findings   Reproductive/Obstetrics negative OB ROS                        Anesthesia Physical Anesthesia Plan  ASA: III  Anesthesia Plan: General   Post-op Pain Management:    Induction: Intravenous  Airway Management Planned: Oral ETT  Additional Equipment:   Intra-op Plan:   Post-operative Plan: Extubation in OR and Possible Post-op intubation/ventilation  Informed Consent: I have reviewed the patients History and Physical, chart, labs and discussed the  procedure including the risks, benefits and alternatives for the proposed anesthesia with the patient or authorized representative who has indicated his/her understanding and acceptance.   Dental advisory given  Plan Discussed with: CRNA  Anesthesia Plan Comments:        Anesthesia Quick Evaluation

## 2013-12-06 NOTE — Anesthesia Procedure Notes (Signed)
Procedure Name: Intubation Date/Time: 12/06/2013 1:45 PM Performed by: Trixie Deis A Pre-anesthesia Checklist: Patient identified, Timeout performed, Emergency Drugs available, Suction available and Patient being monitored Patient Re-evaluated:Patient Re-evaluated prior to inductionOxygen Delivery Method: Circle system utilized Preoxygenation: Pre-oxygenation with 100% oxygen Intubation Type: IV induction Ventilation: Mask ventilation without difficulty and Oral airway inserted - appropriate to patient size Laryngoscope Size: Mac and 4 Grade View: Grade I Tube type: Oral Tube size: 8.0 mm Number of attempts: 1 Airway Equipment and Method: Stylet Placement Confirmation: ETT inserted through vocal cords under direct vision,  breath sounds checked- equal and bilateral and positive ETCO2 Secured at: 23 cm Tube secured with: Tape Dental Injury: Teeth and Oropharynx as per pre-operative assessment

## 2013-12-06 NOTE — Telephone Encounter (Signed)
Call staff at [facility], left a message for patient.  App help of all involved.

## 2013-12-06 NOTE — Op Note (Signed)
Preoperative diagnosis: Choledocholithiasis, acute cholecystitis Postoperative diagnosis: Choledocholithiasis, right upper quadrant abscess Procedure: Diagnostic laparoscopy with drainage of right upper quadrant abscess Surgeon: Dr. Serita Grammes Asst.: Saverio Danker Estimated blood loss: 50 cc Drains: 2 19 French Blake drains, see operative report below Complications: None Anesthesia: Gen. Sponge and count correct completion Disposition to recovery stable  Indications: This is an 80 year old male who presented with a white blood cell count of 46,000 and right upper quadrant pain. He had a mildly elevated bilirubin. He has imaging consistent with cholecystitis and a possible common duct stone also. He was seen in conjunction with gastroenterology we decided to proceed with an ERCP followed by a laparoscopic cholecystectomy. I discussed the risks and benefits of this prior to beginning.  Procedure: The patient first underwent an ERCP with Dr. Ardis Hughs. I had obtained informed consent from the patient. He was being given antibiotics already. He has sequential compression devices on his legs. Once Dr. Ardis Hughs was completed with the ERCP I followed with the my portion. He was rolled supine. His abdomen was prepped and draped in the standard sterile surgical fashion as he was already under anesthesia. Surgical time out was then performed.  I infiltrated Marcaine below his umbilicus. I then made vertical incision carried this to the fascia. The fascia was entered sharply. The peritoneum was entered bluntly. A 0 Vicryl pursestring suture was placed through the fascia. I then inserted a Hassan trocar insufflated the abdomen to 15 mm mercury pressure. Upon entering it was noted that it appeared he had a right upper quadrant abscess and either his omentum and his gallbladder were adherent to his abdominal wall. I inserted an epigastric trocar under direct vision. I then used blunt dissection to dissect an  abscess and either his omentum and gallbladder that were adherent to his anterior abdominal wall. This was drained in its entirety. Eventually I was able to free his liver from the anterior abdominal wall. Once I had done this I inserted 2 more trochars. I was really unable to identify his gallbladder and distinguish it from any of the surrounding structures. It appeared that I had made a rent in his gallbladder while I was removed from the abdominal wall. I think I took care of most of the problem with the abscess. I think even to open him at this point was going to be a significantly high chance of causing injury to any surrounding structures. I elected to place drains and see if he will get better from this which I think he likely will. I placed 2 19 French Blake drains. One of these is lateral to the liver in the right gutter. The other went into what I think is the gallbladder. These were both secured with 2-0 nylon sutures. Irrigation was performed and then evacuated. I then removed all my trocars. I tied down my umbilical stitches completely obliterated the defect. The remaining incisions were then closed with 4 Monocryl and Dermabond. He tolerated this well was extubated and transferred to recovery stable.

## 2013-12-06 NOTE — Progress Notes (Signed)
ABG results called to Dr. Conrad Salem. Plan to transfer patient back to [facility] and have hospitalist continue care of patient.

## 2013-12-06 NOTE — Consult Note (Signed)
PULMONARY / CRITICAL CARE MEDICINE   Name: [name] [name] MRN: [id number] DOB: [birth year]    ADMISSION DATE:  12/05/2013 CONSULTATION DATE: 12/05/13  REFERRING MD :  Shanda Howells, MD PRIMARY SERVICE: Family Medicine  CHIEF COMPLAINT:  RUQ pain, sepsis  BRIEF PATIENT DESCRIPTION:  80 years old functional man with PMH relevant for DM, HTN, CAD post CABG, CKD, presents with cholecystitis and sepsis.  SIGNIFICANT EVENTS / STUDIES:  CT scan of the abdomen:  IMPRESSION:  1. Hydropic gallbladder with calcified gallstones and mild stranding  suspicious for acute cholecystitis. Consider correlation with  ultrasound.  2. Possible tiny calcified stone in the proximal common bile duct.  3. Low attenuation in the liver adjacent to the gallbladder fossa  may represent hepatic edema in the setting of cholecystitis or focal  fatty infiltration.  4. Small hiatal hernia.  5. Renal cysts.    LINES / TUBES: - Peripheral IV's  CULTURES: - Blood cultures sent  ANTIBIOTICS: - Zosyn  - Vancomycin  SUBJECTIVE: No events overnight.  BP stable overnight.  VITAL SIGNS: Temp:  [97.5 F (36.4 C)-100.4 F (38 C)] 98.1 F (36.7 C) (03/30 0900) Pulse Rate:  [82-123] 87 (03/30 0900) Resp:  [19-38] 19 (03/30 0900) BP: (95-196)/(25-78) 95/56 mmHg (03/30 0900) SpO2:  [92 %-100 %] 96 % (03/30 0900) Weight:  [196 lb 3.4 oz (89 kg)-197 lb 12 oz (89.7 kg)] 196 lb 3.4 oz (89 kg) (03/30 0500) HEMODYNAMICS:   VENTILATOR SETTINGS:   INTAKE / OUTPUT: Intake/Output     03/29 0701 - 03/30 0700 03/30 0701 - 03/31 0700   I.V. (mL/kg) 1000 (11.2) 480.8 (5.4)   IV Piggyback  50   Total Intake(mL/kg) 1000 (11.2) 530.8 (6)   Urine (mL/kg/hr) 270    Total Output 270     Net +730 +530.8         PHYSICAL EXAMINATION: General: Pleasant male patient in no acute distress. Eyes: Anicteric sclerae. ENT: Oropharynx clear. Dry mucous membranes. No thrush Lymph: No cervical, supraclavicular, or axillary  lymphadenopathy. Heart: Normal S1, S2. No murmurs, rubs, or gallops appreciated. No bruits, equal pulses. Lungs: Normal excursion, no dullness to percussion. Good air movement bilaterally, without wheezes or crackles. Normal upper airway sounds without evidence of stridor. Abdomen: Abdomen soft, non-tender and not distended, normoactive bowel sounds. No hepatosplenomegaly or masses. Musculoskeletal: No clubbing or synovitis. Skin: No rashes or lesions Neuro: No focal neurologic deficits.  LABS:  CBC  Recent Labs Lab 12/05/13 1452 12/06/13 0315  WBC 42.8* 23.3*  HGB 13.1 10.6*  HCT 36.8* 30.1*  PLT 327 271   Coag's No results found for this basename: APTT, INR,  in the last 168 hours BMET  Recent Labs Lab 12/05/13 1452 12/06/13 0315  NA 134* 136*  K 4.7 5.1  CL 93* 102  CO2 19 17*  BUN 22 28*  CREATININE 2.35* 2.11*  GLUCOSE 232* 85   Electrolytes  Recent Labs Lab 12/05/13 1452 12/06/13 0315  CALCIUM 9.5 8.2*   Sepsis Markers No results found for this basename: LATICACIDVEN, PROCALCITON, O2SATVEN,  in the last 168 hours ABG No results found for this basename: PHART, PCO2ART, PO2ART,  in the last 168 hours Liver Enzymes  Recent Labs Lab 12/05/13 1452 12/06/13 0315  AST 54* 47*  ALT 64* 54*  ALKPHOS 270* 227*  BILITOT 1.5* 1.5*  ALBUMIN 2.3* 2.0*   Cardiac Enzymes No results found for this basename: TROPONINI, PROBNP,  in the last 168 hours Glucose  Recent Labs  Lab 12/05/13 2118 12/06/13 0745  GLUCAP 160* 115*    Imaging Ct Abdomen Pelvis Wo Contrast  12/05/2013   CLINICAL DATA:  Abdominal pain and vomiting.  Weakness.  EXAM: CT ABDOMEN AND PELVIS WITHOUT CONTRAST  TECHNIQUE: Multidetector CT imaging of the abdomen and pelvis was performed following the standard protocol without intravenous contrast.  COMPARISON:  CT ABDOMEN W/O CM dated 01/28/2006  FINDINGS: Lung Bases: Right-greater-than-left basilar atelectasis.  Liver: Low attenuation is  present in the right hepatic lobe adjacent to the gallbladder fossa, suspicious for hepatic edema. Focal fatty infiltration can also have this appearance.  Spleen:  Old granulomatous disease.  Gallbladder: Hydropic gallbladder, with calcified gallstones in the gallbladder neck. Suspect tiny calcified stone in the proximal common bile duct. Mild stranding is present around the gallbladder, again supporting a diagnosis of acute cholecystitis. Gallbladder ultrasound recommended for further assessment.  Common bile duct: Possible tiny gallstone in the proximal common bile duct. Distal common bile duct within normal limits for age.  Pancreas:  Normal.  Adrenal glands:  Normal.  Kidneys: Low-density lesions are present in the kidneys compatible with hepatic cysts. The ureters appear within normal limits.  Stomach: Moderate hiatal hernia. Contrast present in the distal esophagus an hernia. Stomach is distended with contrast.  Small bowel: Duodenum appears normal. Small bowel is normal. No mesenteric adenopathy.  Colon: Normal appendix. Colonic diverticulosis. There is no diverticulitis.  Pelvic Genitourinary: Urinary bladder within normal limits. Prostatomegaly with 52 mm transverse prostate span. No free fluid.  Bones: Posterior lumbar interbody fusion. No aggressive osseous lesions.  Vasculature: Atherosclerosis without gross acute vascular abnormality.  Body Wall: Tiny fat containing periumbilical hernia.  IMPRESSION: 1. Hydropic gallbladder with calcified gallstones and mild stranding suspicious for acute cholecystitis. Consider correlation with ultrasound. 2. Possible tiny calcified stone in the proximal common bile duct. 3. Low attenuation in the liver adjacent to the gallbladder fossa may represent hepatic edema in the setting of cholecystitis or focal fatty infiltration. 4. Small hiatal hernia. 5. Renal cysts.   Electronically Signed   By: Dereck Ligas M.D.   On: 12/05/2013 17:37     CXR:   ASSESSMENT /  PLAN:  PULMONARY A: 1) No issues P:   - Saturating 95% on RA  CARDIOVASCULAR A:  1) Borderline hypotension with some response to IVF's No need for pressors now 2) Hypotension likely secondary to sepsis with the most likely source being cholecystitis P:  - Lactate not sent, too late now. - BP more stable now, no need for pressors or transfer to the ICU.  - Continue IVF resuscitation per primary.  RENAL A:   1) Acute on chronic renal failure, likely prerenal P:   - Will follow chemistry - Continue IVF resuscitation  GASTROINTESTINAL A:   1) Acute cholecystitis P:   - Surgical input appreciated - Will follow LFT's - Agree with Zosyn / vancomycin - NPO for possible ERCP in am  - Will eventually need cholecystectomy  HEMATOLOGIC A:   1) No issues P:  - Will follow CBC  INFECTIOUS A:   1) Acute cholecystitis 2) Sepsis P:   - Will follow blood cultures - Zosyn - Vancomycin - Surgery / GI consults for source control  ENDOCRINE A:   1) DM  P:   - Novolog sliding scale  NEUROLOGIC A:   1) No issues   TODAY'S SUMMARY: BP soft but stable and patient is mentating well.  Continue treatment as above.  PCCM will sign off, please  call back if needed.  Rush Farmer, M.D. [facility] Pulmonary/Critical Care Medicine. Pager: [phone]. After hours pager: [phone].

## 2013-12-06 NOTE — Consult Note (Signed)
Admit date: 12/05/2013 Referring Physician  Dr. Jenetta Downer Primary Physician Elsie Stain, MD Primary Cardiologist  Dr. Johnsie Cancel Reason for Consultation  Pre op evaluation prior to cholecystectomy  HPI: 80 year old male patient of Dr. Johnsie Cancel with bypass surgery in 2007x4, SVG to OM 1 and OM 2 sequential, SVG to distal right coronary artery, LIMA to LAD (Dr. Darcey Nora),  with last clinic visit on 08/03/13 with no complaints of angina, dyspnea, palpitations who presented here to the hospital with acute cholecystitis.  On 11/11/13 he had multiple dental extractions and oral surgery and this limited his appetite. Approximately 2 weeks ago he developed epigastric discomfort and vomiting which lasted 2 days. His pain and decreased appetite has been quite persistent. He is becoming weaker and weaker. He was diagnosed with sepsis and cholangitis.  CT scan shows likely cholecystitis, possible small stone and nondilated common bile duct.  His EKG shows right bundle branch block, sinus rhythm with left anterior fascicular block. No change from prior.  Currently he is resting comfortably, family at bedside. No active chest discomfort. Blood pressure has improved with IV fluid resuscitation.  Echocardiogram in 2007 demonstrated normal ejection fraction.  PMH:   Past Medical History  Diagnosis Date  . Diabetes mellitus   . Hypertension 1995  . Hyperlipidemia 12/2005  . Benign essential tremor 2005  . Gout 1995  . GERD (gastroesophageal reflux disease)   . Coronary artery disease 2007    s/p CABG, DR Johnsie Cancel  . Glomerulonephritis 1965    3 months "Rest"  . Prostate cancer 2007    S/P treatment, followed by Urology    PSH:   Past Surgical History  Procedure Laterality Date  . Cystoscopy  02/15/2004    Mod BPH, moder tribec ?  . Cryotherapy of prostate  09/18/2005    2nd, prostate CA  . Adenosine myoview  01/24/2006    Ischemia by EKG, Cath  . Coronary artery bypass graft  01/31/2006    X 4    . Lumbar fusion  02/2012    lumbar spine for spinal stenosis  . Cataract extraction Left 10/2013   Allergies:  Sulfonamide derivatives Prior to Admit Meds:   Prior to Admission medications   Medication Sig Start Date End Date Taking? Authorizing Provider  allopurinol (ZYLOPRIM) 300 MG tablet Take 300 mg by mouth daily.   Yes Historical Provider, MD  atorvastatin (LIPITOR) 40 MG tablet Take 40 mg by mouth daily.   Yes Historical Provider, MD  carvedilol (COREG CR) 20 MG 24 hr capsule Take 1 capsule (20 mg total) by mouth daily. 09/21/13  Yes Tonia Ghent, MD  esomeprazole (NEXIUM) 40 MG capsule Daily as needed 08/17/13  Yes Tonia Ghent, MD  lisinopril (PRINIVIL,ZESTRIL) 40 MG tablet Take 40 mg by mouth daily. 1  TAB  EVERY DAY 08/03/13  Yes Josue Hector, MD  primidone (MYSOLINE) 50 MG tablet Take 25 mg by mouth daily.   Yes Historical Provider, MD   Fam HX:    Family History  Problem Relation Age of Onset  . Heart disease Mother   . Cancer Mother   . Diabetes Sister   . Diabetes Sister   . Cancer Brother     prostate  . Prostate cancer Brother   . Hypothyroidism Brother   . Depression Neg Hx   . Alcohol abuse Neg Hx   . Drug abuse Neg Hx   . Stroke Neg Hx   . Colon cancer Neg Hx  Social HX:    History   Social History  . Marital Status: Married    Spouse Name: N/A    Number of Children: 1  . Years of Education: N/A   Occupational History  . Retired Pharmacist, community    Social History Main Topics  . Smoking status: Never Smoker   . Smokeless tobacco: Never Used  . Alcohol Use: No  . Drug Use: No  . Sexual Activity: No   Other Topics Concern  . 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    Agent orange exposure     ROS:  Denies any strokelike symptoms, bleeding, syncope, palpitations, chest pain, orthopnea, PND All 11 ROS were addressed and are negative except what is stated in  the HPI   Physical Exam: Blood pressure 95/56, pulse 87, temperature 98.1 F (36.7 C), temperature source Oral, resp. rate 19, height 5\' 11"  (1.803 m), weight 196 lb 3.4 oz (89 kg), SpO2 96.00%.   General: Well developed, well nourished, in no acute distress Head: Eyes PERRLA, No xanthomas.  Edentulous. Normal cephalic and atramatic  Lungs:   Clear bilaterally to auscultation and percussion. Normal respiratory effort. No wheezes, no rales. Heart:   HRRR S1 S2 Pulses are 2+ & equal. No murmur, rubs, gallops.  No carotid bruit. No JVD.  No abdominal bruits.  Abdomen: Bowel sounds are positive, overweight  Msk:  Back normal. Normal strength and tone for age. Extremities:  No clubbing, cyanosis or edema.  DP +1 Neuro: Alert and oriented X 3, non-focal, MAE x 4, mild tremulousness GU: Deferred Rectal: Deferred Psych:  Normal affect, responds appropriately      Labs: Lab Results  Component Value Date   WBC 23.3* 12/06/2013   HGB 10.6* 12/06/2013   HCT 30.1* 12/06/2013   MCV 94.4 12/06/2013   PLT 271 12/06/2013     Recent Labs Lab 12/06/13 0315  NA 136*  K 5.1  CL 102  CO2 17*  BUN 28*  CREATININE 2.11*  CALCIUM 8.2*  PROT 6.1  BILITOT 1.5*  ALKPHOS 227*  ALT 54*  AST 47*  GLUCOSE 85   No results found for this basename: CKTOTAL, CKMB, TROPONINI,  in the last 72 hours Lab Results  Component Value Date   CHOL 141 10/14/2013   HDL 46.40 10/14/2013   LDLCALC 75 10/14/2013   TRIG 98.0 10/14/2013      Radiology:  Ct Abdomen Pelvis Wo Contrast  12/05/2013   CLINICAL DATA:  Abdominal pain and vomiting.  Weakness.  EXAM: CT ABDOMEN AND PELVIS WITHOUT CONTRAST  TECHNIQUE: Multidetector CT imaging of the abdomen and pelvis was performed following the standard protocol without intravenous contrast.  COMPARISON:  CT ABDOMEN W/O CM dated 01/28/2006  FINDINGS: Lung Bases: Right-greater-than-left basilar atelectasis.  Liver: Low attenuation is present in the right hepatic lobe adjacent to the  gallbladder fossa, suspicious for hepatic edema. Focal fatty infiltration can also have this appearance.  Spleen:  Old granulomatous disease.  Gallbladder: Hydropic gallbladder, with calcified gallstones in the gallbladder neck. Suspect tiny calcified stone in the proximal common bile duct. Mild stranding is present around the gallbladder, again supporting a diagnosis of acute cholecystitis. Gallbladder ultrasound recommended for further assessment.  Common bile duct: Possible tiny gallstone in the proximal common bile duct. Distal common bile duct within normal limits for age.  Pancreas:  Normal.  Adrenal glands:  Normal.  Kidneys: Low-density lesions are present in the kidneys compatible with hepatic cysts. The ureters appear within normal limits.  Stomach: Moderate hiatal hernia. Contrast present in the distal esophagus an hernia. Stomach is distended with contrast.  Small bowel: Duodenum appears normal. Small bowel is normal. No mesenteric adenopathy.  Colon: Normal appendix. Colonic diverticulosis. There is no diverticulitis.  Pelvic Genitourinary: Urinary bladder within normal limits. Prostatomegaly with 52 mm transverse prostate span. No free fluid.  Bones: Posterior lumbar interbody fusion. No aggressive osseous lesions.  Vasculature: Atherosclerosis without gross acute vascular abnormality.  Body Wall: Tiny fat containing periumbilical hernia.  IMPRESSION: 1. Hydropic gallbladder with calcified gallstones and mild stranding suspicious for acute cholecystitis. Consider correlation with ultrasound. 2. Possible tiny calcified stone in the proximal common bile duct. 3. Low attenuation in the liver adjacent to the gallbladder fossa may represent hepatic edema in the setting of cholecystitis or focal fatty infiltration. 4. Small hiatal hernia. 5. Renal cysts.   Electronically Signed   By: Dereck Ligas M.D.   On: 12/05/2013 17:37     EKG:  See history of present illness Personally viewed.   . heparin   5,000 Units Subcutaneous 3 times per day  . insulin aspart  0-5 Units Subcutaneous QHS  . insulin aspart  0-9 Units Subcutaneous TID WC  . piperacillin-tazobactam (ZOSYN)  IV  3.375 g Intravenous 3 times per day  . sodium chloride  250 mL Intravenous Once   ASSESSMENT/PLAN:    80 year old male with coronary artery disease status post bypass surgery in 2007x4 with diabetes, right bundle branch block, hypertension, recent dental extractions now with acute cholecystitis, elevated LFTs, leukocytosis awaiting ERCP.  1. Preoperative risk stratification-he has not had any recent anginal symptoms, no palpitations, no syncope, no shortness of breath consistent with heart failure. Previous ejection fraction was normal. He was revascularized in 2007. Based upon his lack of cardiac symptoms, he is of mild to moderate risk based upon his prior cardiovascular disease for upcoming surgery/procedures. There is nothing from a cardiac standpoint that is prohibiting him from proceeding. I do not believe that he needs further cardiac testing at this time. Up until recently, he was able to complete greater than 4 METs of activity without difficulty.  Currently his carvedilol, atorvastatin, aspirin are being held because of his current acute illness. Blood pressure most recently is 95/56 and would not support beta blocker. ACE inhibitor is currently being held because of creatinine of 2.35 on admission.  I would first try to restart his beta blocker when he is able to take.  He may proceed with surgery. Please contact us if further assistance is needed.  Candee Furbish, MD  12/06/2013  9:51 AM

## 2013-12-06 NOTE — Progress Notes (Signed)
[name]  [name] [name] VQM:[id number] DOB: 1928/09/16 DOA: 12/05/2013 PCP: Elsie Stain, MD  Time spent :  Brief narrative: 80 year old male patient with multiple medical problems including coronary artery disease and diabetes. Presented to the hospital with complaints of significant right upper quadrant pain for 2 weeks. This was associated with nausea and weakness. He apparently had been seen by primary care provider 2 weeks prior for similar symptoms and was treated for musculoskeletal strain. No apparent fevers or chills at home. Since that time he has had progression in his symptoms and increased anorexia. He also now has right shoulder pain. Since that initial visit he has developed bilious vomiting and has fallen at least twice at home.  Upon evaluation in the ER he was found to have a white count of 42,000. CT of the abdomen and pelvis revealed an enlarged gallbladder with calcified gallstones and stranding concerning for acute cholecystitis. There is possibly a tiny calcified stone in the proximal bile duct. He also was noted to have acute renal failure the creatinine of 2.35 noting his baseline creatinine 1.2 back in February. He had mild transaminitis with an elevated alkaline phosphatase and only mildly elevated total bilirubin. General surgery was contacted by the ER physician. Given his cardiac history preoperative clearance was recommended. In addition to the above findings patient had very soft blood pressures with systolic in the 93O despite IV fluids.  HPI/Subjective: C/o epigastric and abdom pain.  No CP.  No SOB.    Assessment/Plan:    Acute cholecystitis/Choledocholithiasis -Appreciate surgery assistance -Continue empiric antibiotics to cover for possible ascending cholangitis-leukocytosis has decreased to 23,300 this a.m. -Post cardiac clearance patient is to undergo ERCP first by Dr. Ardis Hughs and proceed with cholecystectomy by  Dr. Donne Hazel -Statin on hold given acute transaminitis -Repeat lipase 15 therefore no findings consistent with biliary pancreatitis    Sepsis -Secondary to acute cholecystitis -Continue empiric antibiotics and supportive care -BP remains soft but MAP greater than 65    CAD -Cardiology consulted for surgical clearance -Currently no acute ischemic symptoms noted on exam or endorsed by the patient -BP soft so carvedilol on hold    Hypotension, unspecified -BP remains soft noting baseline readings systolic between 671 and 245 -Suspect related to sepsis and dehydration -Hold offending medications    Acute renal failure on CKD (chronic kidney disease), stage III -BUN and creatinine remained elevated -Continue IV fluids -Suspect related to sepsis so correct infectious processes -Also appears to have a degree of ATN related to underlying use of ACE inhibitor prior to admission    HYPOTHYROIDISM, BORDERLINE -Was not on medications prior to admission    DIABETES MELLITUS, TYPE II -CBGs controlled in less than 200 -Continue sliding scale insulin    HYPERTENSION  DVT prophylaxis: SCDs-transition to pharmacological prophylaxis at discretion of surgical team Code Status: Full Family Communication: Family at bedside Disposition Plan/Expected LOS: Step down  Consultants: Gen. Surgery Gastroenterology Cardiology  Procedures: ERCP pending  Cholecystectomy pending  Antibiotics: Zosyn 3/29 >>> Vancomycin 3/29  X 1 dose  Objective: Blood pressure 95/56, pulse 87, temperature 98.1 F (36.7 C), temperature source Oral, resp. rate 19, height 5\' 11"  (1.803 m), weight 196 lb 3.4 oz (89 kg), SpO2 96.00%.  Intake/Output Summary (Last 24 hours) at 12/06/13 1341 Last data filed at 12/06/13 1031  Gross per 24 hour  Intake 1530.83 ml  Output    270 ml  Net 1260.83 ml   Exam:  General: No acute respiratory distress Lungs: Clear to auscultation bilaterally without wheezes or crackles,  RA Cardiovascular: Regular rate and rhythm without murmur gallop or rub normal S1 and S2, no peripheral edema or JVD Abdomen: Tender with guarding over right upper quadrant-tender left upper quadrant without guarding, distended, soft, bowel sounds positive, no rebound, no ascites, no appreciable mass Musculoskeletal: No significant cyanosis, clubbing of bilateral lower extremities Neurological: Alert and oriented x 3, moves all extremities x 4 without focal neurological deficits, CN 2-12 intact  Scheduled Meds:  Scheduled Meds: . insulin aspart  0-5 Units Subcutaneous QHS  . insulin aspart  0-9 Units Subcutaneous TID WC  . piperacillin-tazobactam (ZOSYN)  IV  3.375 g Intravenous 3 times per day   Data Reviewed: Basic Metabolic Panel:  Recent Labs Lab 12/05/13 1452 12/06/13 0315  NA 134* 136*  K 4.7 5.1  CL 93* 102  CO2 19 17*  GLUCOSE 232* 85  BUN 22 28*  CREATININE 2.35* 2.11*  CALCIUM 9.5 8.2*   Liver Function Tests:  Recent Labs Lab 12/05/13 1452 12/06/13 0315  AST 54* 47*  ALT 64* 54*  ALKPHOS 270* 227*  BILITOT 1.5* 1.5*  PROT 7.2 6.1  ALBUMIN 2.3* 2.0*    Recent Labs Lab 12/05/13 1452 12/06/13 0315  LIPASE 22 15   CBC:  Recent Labs Lab 12/05/13 1452 12/06/13 0315  WBC 42.8* 23.3*  NEUTROABS 40.2* 22.8*  HGB 13.1 10.6*  HCT 36.8* 30.1*  MCV 96.3 94.4  PLT 327 271   CBG:  Recent Labs Lab 12/05/13 2118 12/06/13 0745 12/06/13 1242  GLUCAP 160* 115* 126*    Recent Results (from the past 240 hour(s))  MRSA PCR SCREENING     Status: None   Collection Time    12/05/13  8:30 PM      Result Value Ref Range Status   MRSA by PCR NEGATIVE  NEGATIVE Final   Comment:            The GeneXpert MRSA Assay (FDA     approved for NASAL specimens     only), is one component of a     comprehensive MRSA colonization     surveillance program. It is not     intended to diagnose MRSA     infection nor to guide or     monitor treatment for     MRSA  infections.     Studies:  Recent x-ray studies have been reviewed in detail by the Attending Physician      Erin Hearing, ANP [facility] Office  [phone] Pager [phone]  **If unable to reach the above provider after paging please contact the [place] @ [phone]  On-Call/Text Page:      [email or web address]      password [email or web address]  If 7PM-7AM, please contact night-coverage [email or web address] Password [email or web address] 12/06/2013, 1:41 PM   LOS: 1 day   I have personally examined this patient and reviewed the entire database. I have reviewed the above note, made any necessary editorial changes, and agree with its content.  Cherene Altes, MD [facility]

## 2013-12-06 NOTE — Progress Notes (Signed)
Subjective: Feels better today  Objective: Vital signs in last 24 hours: Temp:  [97.5 F (36.4 C)-100.4 F (38 C)] 100.4 F (38 C) (03/30 0407) Pulse Rate:  [82-123] 94 (03/30 0407) Resp:  [24-38] 31 (03/30 0407) BP: (95-196)/(25-78) 96/62 mmHg (03/30 0407) SpO2:  [92 %-100 %] 92 % (03/30 0407) Weight:  [196 lb 3.4 oz (89 kg)-197 lb 12 oz (89.7 kg)] 196 lb 3.4 oz (89 kg) (03/30 0500)    Intake/Output from previous day: 03/29 0701 - 03/30 0700 In: 1000 [I.V.:1000] Out: 270 [Urine:270] Intake/Output this shift:    General appearance: no distress Resp: clear to auscultation bilaterally Cardio: regular rate and rhythm GI: soft ,mild tender ruq  Lab Results:   Recent Labs  12/05/13 1452 12/06/13 0315  WBC 42.8* 23.3*  HGB 13.1 10.6*  HCT 36.8* 30.1*  PLT 327 271   BMET  Recent Labs  12/05/13 1452 12/06/13 0315  NA 134* 136*  K 4.7 5.1  CL 93* 102  CO2 19 17*  GLUCOSE 232* 85  BUN 22 28*  CREATININE 2.35* 2.11*  CALCIUM 9.5 8.2*   PT/INR No results found for this basename: LABPROT, INR,  in the last 72 hours ABG No results found for this basename: PHART, PCO2, PO2, HCO3,  in the last 72 hours  Studies/Results: Ct Abdomen Pelvis Wo Contrast  12/05/2013   CLINICAL DATA:  Abdominal pain and vomiting.  Weakness.  EXAM: CT ABDOMEN AND PELVIS WITHOUT CONTRAST  TECHNIQUE: Multidetector CT imaging of the abdomen and pelvis was performed following the standard protocol without intravenous contrast.  COMPARISON:  CT ABDOMEN W/O CM dated 01/28/2006  FINDINGS: Lung Bases: Right-greater-than-left basilar atelectasis.  Liver: Low attenuation is present in the right hepatic lobe adjacent to the gallbladder fossa, suspicious for hepatic edema. Focal fatty infiltration can also have this appearance.  Spleen:  Old granulomatous disease.  Gallbladder: Hydropic gallbladder, with calcified gallstones in the gallbladder neck. Suspect tiny calcified stone in the proximal common  bile duct. Mild stranding is present around the gallbladder, again supporting a diagnosis of acute cholecystitis. Gallbladder ultrasound recommended for further assessment.  Common bile duct: Possible tiny gallstone in the proximal common bile duct. Distal common bile duct within normal limits for age.  Pancreas:  Normal.  Adrenal glands:  Normal.  Kidneys: Low-density lesions are present in the kidneys compatible with hepatic cysts. The ureters appear within normal limits.  Stomach: Moderate hiatal hernia. Contrast present in the distal esophagus an hernia. Stomach is distended with contrast.  Small bowel: Duodenum appears normal. Small bowel is normal. No mesenteric adenopathy.  Colon: Normal appendix. Colonic diverticulosis. There is no diverticulitis.  Pelvic Genitourinary: Urinary bladder within normal limits. Prostatomegaly with 52 mm transverse prostate span. No free fluid.  Bones: Posterior lumbar interbody fusion. No aggressive osseous lesions.  Vasculature: Atherosclerosis without gross acute vascular abnormality.  Body Wall: Tiny fat containing periumbilical hernia.  IMPRESSION: 1. Hydropic gallbladder with calcified gallstones and mild stranding suspicious for acute cholecystitis. Consider correlation with ultrasound. 2. Possible tiny calcified stone in the proximal common bile duct. 3. Low attenuation in the liver adjacent to the gallbladder fossa may represent hepatic edema in the setting of cholecystitis or focal fatty infiltration. 4. Small hiatal hernia. 5. Renal cysts.   Electronically Signed   By: Dereck Ligas M.D.   On: 12/05/2013 17:37    Anti-infectives: Anti-infectives   Start     Dose/Rate Route Frequency Ordered Stop   12/05/13 1900  vancomycin (VANCOCIN) IVPB  1000 mg/200 mL premix     1,000 mg 200 mL/hr over 60 Minutes Intravenous  Once 12/05/13 1849 12/05/13 2038   12/05/13 1815  piperacillin-tazobactam (ZOSYN) IVPB 3.375 g     3.375 g 100 mL/hr over 30 Minutes Intravenous   Once 12/05/13 1808 12/05/13 1942      Assessment/Plan: Cholecystitis, possible choledocholithiasis (questionable possible cholangitis given appearance)  He has strong predictor for a stone in his duct given ct appearance.  I have discussed with Dr Ardis Hughs who will consider for ercp.  He still needs cards to see this am although he certainly appears to be able to undergo procedures.  If able can consider combining the lap chole with ercp. I discussed the procedure in detail.    We discussed the risks and benefits of a laparoscopic cholecystectomy and possible cholangiogram including, but not limited to bleeding, infection, injury to surrounding structures such as the intestine or liver, bile leak, retained gallstones, need to convert to an open procedure, prolonged diarrhea, blood clots such as  DVT, common bile duct injury, anesthesia risks, and possible need for additional procedures.    [facility] 12/06/2013

## 2013-12-07 ENCOUNTER — Encounter (HOSPITAL_COMMUNITY): Payer: Self-pay | Admitting: Gastroenterology

## 2013-12-07 ENCOUNTER — Telehealth: Payer: Self-pay | Admitting: Family Medicine

## 2013-12-07 LAB — CBC WITH DIFFERENTIAL/PLATELET
Basophils Absolute: 0 10*3/uL (ref 0.0–0.1)
Basophils Relative: 0 % (ref 0–1)
EOS ABS: 0 10*3/uL (ref 0.0–0.7)
EOS PCT: 0 % (ref 0–5)
HEMATOCRIT: 27.9 % — AB (ref 39.0–52.0)
Hemoglobin: 9.5 g/dL — ABNORMAL LOW (ref 13.0–17.0)
Lymphocytes Relative: 4 % — ABNORMAL LOW (ref 12–46)
Lymphs Abs: 0.8 10*3/uL (ref 0.7–4.0)
MCH: 32.8 pg (ref 26.0–34.0)
MCHC: 34.1 g/dL (ref 30.0–36.0)
MCV: 96.2 fL (ref 78.0–100.0)
MONO ABS: 0.7 10*3/uL (ref 0.1–1.0)
Monocytes Relative: 4 % (ref 3–12)
Neutro Abs: 17.2 10*3/uL — ABNORMAL HIGH (ref 1.7–7.7)
Neutrophils Relative %: 92 % — ABNORMAL HIGH (ref 43–77)
Platelets: 259 10*3/uL (ref 150–400)
RBC: 2.9 MIL/uL — ABNORMAL LOW (ref 4.22–5.81)
RDW: 14.6 % (ref 11.5–15.5)
WBC: 18.8 10*3/uL — ABNORMAL HIGH (ref 4.0–10.5)

## 2013-12-07 LAB — COMPREHENSIVE METABOLIC PANEL
ALT: 36 U/L (ref 0–53)
AST: 31 U/L (ref 0–37)
Albumin: 1.9 g/dL — ABNORMAL LOW (ref 3.5–5.2)
Alkaline Phosphatase: 130 U/L — ABNORMAL HIGH (ref 39–117)
BUN: 40 mg/dL — ABNORMAL HIGH (ref 6–23)
CALCIUM: 8 mg/dL — AB (ref 8.4–10.5)
CO2: 18 mEq/L — ABNORMAL LOW (ref 19–32)
CREATININE: 1.85 mg/dL — AB (ref 0.50–1.35)
Chloride: 106 mEq/L (ref 96–112)
GFR, EST AFRICAN AMERICAN: 37 mL/min — AB (ref >90)
GFR, EST NON AFRICAN AMERICAN: 32 mL/min — AB (ref >90)
GLUCOSE: 153 mg/dL — AB (ref 70–99)
Potassium: 4.8 mEq/L (ref 3.7–5.3)
Sodium: 138 mEq/L (ref 137–147)
TOTAL PROTEIN: 5.9 g/dL — AB (ref 6.0–8.3)
Total Bilirubin: 1.5 mg/dL — ABNORMAL HIGH (ref 0.3–1.2)

## 2013-12-07 LAB — URINE CULTURE

## 2013-12-07 LAB — HEMOGLOBIN A1C
Hgb A1c MFr Bld: 6.1 % — ABNORMAL HIGH (ref <5.7)
Mean Plasma Glucose: 128 mg/dL — ABNORMAL HIGH (ref <117)

## 2013-12-07 LAB — GLUCOSE, CAPILLARY
GLUCOSE-CAPILLARY: 162 mg/dL — AB (ref 70–99)
GLUCOSE-CAPILLARY: 99 mg/dL (ref 70–99)
Glucose-Capillary: 97 mg/dL (ref 70–99)

## 2013-12-07 NOTE — Progress Notes (Signed)
1 Day Post-Op  Subjective: Feels ok this am, some soreness at incision  Objective: Vital signs in last 24 hours: Temp:  [97.5 F (36.4 C)-98.2 F (36.8 C)] 97.9 F (36.6 C) (03/31 0753) Pulse Rate:  [77-106] 90 (03/31 0753) Resp:  [15-25] 22 (03/31 0753) BP: (95-148)/(56-80) 118/61 mmHg (03/31 0753) SpO2:  [91 %-100 %] 98 % (03/31 0753) FiO2 (%):  [0.5 %-60 %] 50 % (03/30 1942) Weight:  [196 lb 3.4 oz (89 kg)] 196 lb 3.4 oz (89 kg) (03/31 0400) Last BM Date: 12/05/13  Intake/Output from previous day: 03/30 0701 - 03/31 0700 In: 5068.3 [I.V.:4518.3; IV Piggyback:450] Out: 283 [Urine:375; Drains:181; Blood:20] Intake/Output this shift:    General appearance: no distress GI: soft approp tender drains with serosang output, incisions celan  Lab Results:   Recent Labs  12/06/13 0315 12/07/13 0328  WBC 23.3* 18.8*  HGB 10.6* 9.5*  HCT 30.1* 27.9*  PLT 271 259   BMET  Recent Labs  12/05/13 1452 12/06/13 0315  NA 134* 136*  K 4.7 5.1  CL 93* 102  CO2 19 17*  GLUCOSE 232* 85  BUN 22 28*  CREATININE 2.35* 2.11*  CALCIUM 9.5 8.2*   PT/INR No results found for this basename: LABPROT, INR,  in the last 72 hours ABG  Recent Labs  12/06/13 1725  PHART 7.299*  HCO3 18.9*    Studies/Results: Ct Abdomen Pelvis Wo Contrast  12/05/2013   CLINICAL DATA:  Abdominal pain and vomiting.  Weakness.  EXAM: CT ABDOMEN AND PELVIS WITHOUT CONTRAST  TECHNIQUE: Multidetector CT imaging of the abdomen and pelvis was performed following the standard protocol without intravenous contrast.  COMPARISON:  CT ABDOMEN W/O CM dated 01/28/2006  FINDINGS: Lung Bases: Right-greater-than-left basilar atelectasis.  Liver: Low attenuation is present in the right hepatic lobe adjacent to the gallbladder fossa, suspicious for hepatic edema. Focal fatty infiltration can also have this appearance.  Spleen:  Old granulomatous disease.  Gallbladder: Hydropic gallbladder, with calcified gallstones in the  gallbladder neck. Suspect tiny calcified stone in the proximal common bile duct. Mild stranding is present around the gallbladder, again supporting a diagnosis of acute cholecystitis. Gallbladder ultrasound recommended for further assessment.  Common bile duct: Possible tiny gallstone in the proximal common bile duct. Distal common bile duct within normal limits for age.  Pancreas:  Normal.  Adrenal glands:  Normal.  Kidneys: Low-density lesions are present in the kidneys compatible with hepatic cysts. The ureters appear within normal limits.  Stomach: Moderate hiatal hernia. Contrast present in the distal esophagus an hernia. Stomach is distended with contrast.  Small bowel: Duodenum appears normal. Small bowel is normal. No mesenteric adenopathy.  Colon: Normal appendix. Colonic diverticulosis. There is no diverticulitis.  Pelvic Genitourinary: Urinary bladder within normal limits. Prostatomegaly with 52 mm transverse prostate span. No free fluid.  Bones: Posterior lumbar interbody fusion. No aggressive osseous lesions.  Vasculature: Atherosclerosis without gross acute vascular abnormality.  Body Wall: Tiny fat containing periumbilical hernia.  IMPRESSION: 1. Hydropic gallbladder with calcified gallstones and mild stranding suspicious for acute cholecystitis. Consider correlation with ultrasound. 2. Possible tiny calcified stone in the proximal common bile duct. 3. Low attenuation in the liver adjacent to the gallbladder fossa may represent hepatic edema in the setting of cholecystitis or focal fatty infiltration. 4. Small hiatal hernia. 5. Renal cysts.   Electronically Signed   By: Dereck Ligas M.D.   On: 12/05/2013 17:37   Dg Chest Port 1 View  12/06/2013   CLINICAL  DATA:  Respiratory distress after cholecystectomy.  EXAM: PORTABLE CHEST - 1 VIEW  COMPARISON:  DG CHEST 2 VIEW dated 02/14/2012  FINDINGS: Lung volumes are very low with bibasilar atelectasis present, right greater than left. No overt edema or  focal airspace consolidation is seen. The heart is enlarged and patient has had prior CABG. No evidence of pneumothorax.  IMPRESSION: Very low lung volumes with bibasilar atelectasis postoperatively.   Electronically Signed   By: Aletta Edouard M.D.   On: 12/06/2013 17:11   Dg Ercp Biliary & Pancreatic Ducts  12/06/2013   CLINICAL DATA:  CBD stone  EXAM: ERCP  TECHNIQUE: Multiple spot images obtained with the fluoroscopic device and submitted for interpretation post-procedure.  COMPARISON:  CT ABD/PELV WO CM dated 12/05/2013  FINDINGS: A single spot intraoperative radiographic images of the right upper quadrant during ERCP is provided for review.  Image demonstrates an ERCP probe overlying the right upper abdominal quadrant with selective cannulation and opacification of the common bile duct which appears nondilated. No discrete filling defects are seen within the common bile duct given obliquity.  There is minimal opacification of the central aspect of the intrahepatic biliary tree which appears nondilated.  There is minimal opacification of the central aspect of the cystic duct and the neck of the gallbladder. The known stones within the neck of the gallbladder and cystic duct are not well demonstrated on present examination secondary to underdistention.  There is no definitive opacification of the pancreatic duct.  IMPRESSION: ERCP as above.  These images were submitted for radiologic interpretation only. Please see the procedural report for the amount of contrast and the fluoroscopy time utilized.   Electronically Signed   By: Sandi Mariscal M.D.   On: 12/06/2013 16:27    Anti-infectives: Anti-infectives   Start     Dose/Rate Route Frequency Ordered Stop   12/06/13 1200  piperacillin-tazobactam (ZOSYN) IVPB 3.375 g  Status:  Discontinued     3.375 g 100 mL/hr over 30 Minutes Intravenous 4 times per day 12/06/13 0924 12/06/13 0933   12/06/13 0945  piperacillin-tazobactam (ZOSYN) IVPB 3.375 g     3.375  g 12.5 mL/hr over 240 Minutes Intravenous 3 times per day 12/06/13 0935     12/05/13 1900  vancomycin (VANCOCIN) IVPB 1000 mg/200 mL premix     1,000 mg 200 mL/hr over 60 Minutes Intravenous  Once 12/05/13 1849 12/05/13 2038   12/05/13 1815  piperacillin-tazobactam (ZOSYN) IVPB 3.375 g     3.375 g 100 mL/hr over 30 Minutes Intravenous  Once 12/05/13 1808 12/05/13 1942      Assessment/Plan: POD 1 ercp/dx lsc with drainage ruq abscess  He is doing pretty well this am, clinically looks well, drain with expected output, wbc still improving. I discussed case with him and that I did not remove gb but drained abscess If improves will continue abx and drains with consideration of chole later If does not improve will reimage Can give clears and diet  [facility] 12/07/2013

## 2013-12-07 NOTE — Progress Notes (Signed)
PT taken off Bipap to nasal cannula by RN. Pt tolerating [place] well at this time. No distress noted.

## 2013-12-07 NOTE — Anesthesia Postprocedure Evaluation (Signed)
  Anesthesia Post-op Note  Patient: [name] [name]  Procedure(s) Performed: Procedure(s): ENDOSCOPIC RETROGRADE CHOLANGIOPANCREATOGRAPHY (ERCP) (N/A) Diagnostic Laparoscopy for  drainage Intrabdominal abcess (N/A)  Patient Location: PACU  Anesthesia Type:General  Level of Consciousness: awake, alert , oriented and patient cooperative  Airway and Oxygen Therapy: Patient Spontanous Breathing  Post-op Pain: mild  Post-op Assessment: Post-op Vital signs reviewed, Patient's Cardiovascular Status Stable, Respiratory Function Stable, Patent Airway, No signs of Nausea or vomiting and Pain level controlled  Post-op Vital Signs: stable  Complications: No apparent anesthesia complications

## 2013-12-07 NOTE — Progress Notes (Signed)
[name]  [name] [name] XBD:[id number] DOB: 10/18/1928 DOA: 12/05/2013 PCP: Elsie Stain, MD  Time spent : 57mins  Brief narrative: 80 year old male patient with multiple medical problems including coronary artery disease and diabetes. Presented to the hospital with complaints of significant right upper quadrant pain for 2 weeks. This was associated with nausea and weakness. He apparently had been seen by his primary care provider 2 weeks prior for similar symptoms and was treated for musculoskeletal strain. No apparent fevers or chills at home. Since that time he has had progression in his symptoms and increased anorexia. He also now has right shoulder pain. Since that initial visit he has developed bilious vomiting and has fallen at least twice at home.  Upon evaluation in the ER he was found to have a white count of 42,000. CT of the abdomen and pelvis revealed an enlarged gallbladder with calcified gallstones and stranding concerning for acute cholecystitis. There is possibly a tiny calcified stone in the proximal bile duct. He also was noted to have acute renal failure w/ creatinine of 2.35 noting his baseline creatinine 1.2 back in February. He had mild transaminitis with an elevated alkaline phosphatase and only mildly elevated total bilirubin. General Surgery was contacted by the ER physician. Given his cardiac history preoperative clearance was recommended. In addition to the above findings patient had very soft blood pressures with systolic in the 83M despite IV fluids.  HPI/Subjective: Primarily endorsing operative site pain - sts feels much better than yesterday.  No chest pain or SOB.  Assessment/Plan:  Acute cholecystitis/Choledocholithiasis -s/p attempted lap chole - due to perforated GB underwent diag laparoscopy with drainage of RUQ abscess -follow Blake drains x 2- plan is eventual cholecystectomy  -Continue empiric antibiotics -  WBCs cont to trend down -also underwent ERCP with stone retrieval + sphincterotomy by Dr. Ardis Hughs -Statin on hold given acute transaminitis  Sepsis -Secondary to acute cholecystitis -Continue empiric antibiotics and supportive care -BP improved -somewhat acidotic but this could be 2/2 mild ARF  CAD -Cardiology consulted for surgical clearance -Currently no acute ischemic symptoms noted on exam or endorsed by the patient -BP soft so carvedilol on hold  Hypotension -BP remains soft noting baseline readings systolic between 196 and 222 -related to sepsis and dehydration -Hold offending medications  Acute renal failure on CKD (chronic kidney disease), stage III -BUN and creatinine slowly trending downward -Continue IV fluids -Suspect related to sepsis so correct infectious processes -Also appears to have a degree of ATN related to underlying use of ACE inhibitor prior to admission  ? OSA -diff to extubate post op - may simple be reflection of severity of illness, or indicative of occult OSA -consider outpt eval as indicated  HYPOTHYROIDISM, BORDERLINE -Was not on medications prior to admission  DIABETES MELLITUS, TYPE II -CBGs controlled and less than 200 -Continue sliding scale insulin  HYPERTENSION  DVT prophylaxis: SCDs-transition to pharmacological prophylaxis at discretion of surgical team Code Status: Full Family Communication: Family at bedside Disposition Plan/Expected LOS: Step down  Consultants: Gen. Surgery Gastroenterology Cardiology  Procedures: ERCP   Exploratory Laparoscopy w/ drainage placement x2  Antibiotics: Zosyn 3/29 >>> Vancomycin 3/29    Objective: Blood pressure 116/61, pulse 85, temperature 98.5 F (36.9 C), temperature source Oral, resp. rate 26, height 5\' 11"  (1.803 m), weight 196 lb 3.4 oz (89 kg), SpO2 96.00%.  Intake/Output Summary (Last 24 hours) at 12/07/13 1246 Last data filed at 12/07/13 0700  Gross per 24 hour  Intake  4537.42 ml  Output    576 ml  Net 3961.42 ml   Exam: General: No acute respiratory distress Lungs: Clear to auscultation bilaterally without wheezes or crackles, 2L Cardiovascular: Regular rate and rhythm without murmur gallop or rub normal S1 and S2, no peripheral edema or JVD Abdomen: Tender over trocar sites and somewhat in RUQ, slightly distended, soft, bowel sounds positive, no rebound, no ascites, no appreciable mass; Blake Drains x 2 Musculoskeletal: No significant cyanosis, clubbing of bilateral lower extremities Neurological: Alert and oriented x 3, moves all extremities x 4 without focal neurological deficits, CN 2-12 intact  Scheduled Meds:  Scheduled Meds: . carvedilol  20 mg Oral Daily  . insulin aspart  0-5 Units Subcutaneous QHS  . insulin aspart  0-9 Units Subcutaneous TID WC  . piperacillin-tazobactam (ZOSYN)  IV  3.375 g Intravenous 3 times per day   Data Reviewed: Basic Metabolic Panel:  Recent Labs Lab 12/05/13 1452 12/06/13 0315 12/07/13 1130  NA 134* 136* 138  K 4.7 5.1 4.8  CL 93* 102 106  CO2 19 17* 18*  GLUCOSE 232* 85 153*  BUN 22 28* 40*  CREATININE 2.35* 2.11* 1.85*  CALCIUM 9.5 8.2* 8.0*   Liver Function Tests:  Recent Labs Lab 12/05/13 1452 12/06/13 0315 12/07/13 1130  AST 54* 47* 31  ALT 64* 54* 36  ALKPHOS 270* 227* 130*  BILITOT 1.5* 1.5* 1.5*  PROT 7.2 6.1 5.9*  ALBUMIN 2.3* 2.0* 1.9*    Recent Labs Lab 12/05/13 1452 12/06/13 0315  LIPASE 22 15   CBC:  Recent Labs Lab 12/05/13 1452 12/06/13 0315 12/07/13 0328  WBC 42.8* 23.3* 18.8*  NEUTROABS 40.2* 22.8* 17.2*  HGB 13.1 10.6* 9.5*  HCT 36.8* 30.1* 27.9*  MCV 96.3 94.4 96.2  PLT 327 271 259   CBG:  Recent Labs Lab 12/06/13 1529 12/06/13 1801 12/06/13 2147 12/07/13 0627 12/07/13 1138  GLUCAP 121* 153* 133* 97 162*    Recent Results (from the past 240 hour(s))  MRSA PCR SCREENING     Status: None   Collection Time    12/05/13  8:30 PM      Result  Value Ref Range Status   MRSA by PCR NEGATIVE  NEGATIVE Final   Comment:            The GeneXpert MRSA Assay (FDA     approved for NASAL specimens     only), is one component of a     comprehensive MRSA colonization     surveillance program. It is not     intended to diagnose MRSA     infection nor to guide or     monitor treatment for     MRSA infections.  CULTURE, BLOOD (ROUTINE X 2)     Status: None   Collection Time    12/05/13  9:00 PM      Result Value Ref Range Status   Specimen Description BLOOD LEFT ARM   Final   Special Requests     Final   Value: BOTTLES DRAWN AEROBIC AND ANAEROBIC 10CC AEROBIC 8CC ANAEROBIC   Culture  Setup Time     Final   Value: 12/06/2013 01:37     Performed at [facility]   Culture     Final   Value:        BLOOD CULTURE RECEIVED NO GROWTH TO DATE CULTURE WILL BE HELD FOR 5 DAYS BEFORE ISSUING A FINAL NEGATIVE REPORT  Performed at [facility]   Report Status PENDING   Incomplete  CULTURE, BLOOD (ROUTINE X 2)     Status: None   Collection Time    12/05/13  9:30 PM      Result Value Ref Range Status   Specimen Description BLOOD LEFT HAND   Final   Special Requests BOTTLES DRAWN AEROBIC ONLY 5CC   Final   Culture  Setup Time     Final   Value: 12/06/2013 01:36     Performed at [facility]   Culture     Final   Value:        BLOOD CULTURE RECEIVED NO GROWTH TO DATE CULTURE WILL BE HELD FOR 5 DAYS BEFORE ISSUING A FINAL NEGATIVE REPORT     Performed at [facility]   Report Status PENDING   Incomplete  URINE CULTURE     Status: None   Collection Time    12/06/13  2:24 AM      Result Value Ref Range Status   Specimen Description URINE, CLEAN CATCH   Final   Special Requests NONE   Final   Culture  Setup Time     Final   Value: 12/06/2013 09:13     Performed at [facility]     Final   Value: 1,000 COLONIES/ML     Performed at [facility]   Culture     Final   Value:  INSIGNIFICANT GROWTH     Performed at [facility]   Report Status 12/07/2013 FINAL   Final     Studies:  Recent x-ray studies have been reviewed in detail by the Attending Physician      Erin Hearing, [place] [facility] Office  [phone] Pager [phone]  **If unable to reach the above provider after paging please contact the [facility] @ [phone]  On-Call/Text Page:      [email or web address]      password [email or web address]  If 7PM-7AM, please contact night-coverage [email or web address] Password [email or web address] 12/07/2013, 12:46 PM   LOS: 2 days   I have personally examined this patient and reviewed the entire database. I have reviewed the above note, made any necessary editorial changes, and agree with its content.  Cherene Altes, MD [facility]

## 2013-12-07 NOTE — Telephone Encounter (Signed)
I called pt and wished him well.  He had gotten progressively worse after the OV with me.  Notes reviewed.  He is back in 2C10, on clear liquids.   I thanked Copy for their help.  I thanked patient for taking my call.

## 2013-12-07 NOTE — Progress Notes (Signed)
[facility] Progress Note    Since last GI note: ERCP and then Laparoscopy yesterday with drainage of abd abscess.  Overall he feels better than yesterday but still in abd pain.    Objective: Vital signs in last 24 hours: Temp:  [97.5 F (36.4 C)-98.2 F (36.8 C)] 97.9 F (36.6 C) (03/31 0753) Pulse Rate:  [77-106] 90 (03/31 0753) Resp:  [15-25] 22 (03/31 0753) BP: (95-148)/(56-80) 118/61 mmHg (03/31 0753) SpO2:  [91 %-100 %] 98 % (03/31 0753) FiO2 (%):  [0.5 %-60 %] 50 % (03/30 1942) Weight:  [196 lb 3.4 oz (89 kg)] 196 lb 3.4 oz (89 kg) (03/31 0400) Last BM Date: 12/05/13 General: alert and oriented times 3 Heart: regular rate and rythm Abdomen: soft, appropriately tender, non-distended, no bowel sounds 2 drains in place (serosanguninous fluid in each)   Lab Results:  Recent Labs  12/05/13 1452 12/06/13 0315 12/07/13 0328  WBC 42.8* 23.3* 18.8*  HGB 13.1 10.6* 9.5*  PLT 327 271 259  MCV 96.3 94.4 96.2    Recent Labs  12/05/13 1452 12/06/13 0315  NA 134* 136*  K 4.7 5.1  CL 93* 102  CO2 19 17*  GLUCOSE 232* 85  BUN 22 28*  CREATININE 2.35* 2.11*  CALCIUM 9.5 8.2*    Recent Labs  12/05/13 1452 12/06/13 0315  PROT 7.2 6.1  ALBUMIN 2.3* 2.0*  AST 54* 47*  ALT 64* 54*  ALKPHOS 270* 227*  BILITOT 1.5* 1.5*   Studies/Results: Ct Abdomen Pelvis Wo Contrast  12/05/2013   CLINICAL DATA:  Abdominal pain and vomiting.  Weakness.  EXAM: CT ABDOMEN AND PELVIS WITHOUT CONTRAST  TECHNIQUE: Multidetector CT imaging of the abdomen and pelvis was performed following the standard protocol without intravenous contrast.  COMPARISON:  CT ABDOMEN W/O CM dated 01/28/2006  FINDINGS: Lung Bases: Right-greater-than-left basilar atelectasis.  Liver: Low attenuation is present in the right hepatic lobe adjacent to the gallbladder fossa, suspicious for hepatic edema. Focal fatty infiltration can also have this appearance.  Spleen:  Old granulomatous disease.   Gallbladder: Hydropic gallbladder, with calcified gallstones in the gallbladder neck. Suspect tiny calcified stone in the proximal common bile duct. Mild stranding is present around the gallbladder, again supporting a diagnosis of acute cholecystitis. Gallbladder ultrasound recommended for further assessment.  Common bile duct: Possible tiny gallstone in the proximal common bile duct. Distal common bile duct within normal limits for age.  Pancreas:  Normal.  Adrenal glands:  Normal.  Kidneys: Low-density lesions are present in the kidneys compatible with hepatic cysts. The ureters appear within normal limits.  Stomach: Moderate hiatal hernia. Contrast present in the distal esophagus an hernia. Stomach is distended with contrast.  Small bowel: Duodenum appears normal. Small bowel is normal. No mesenteric adenopathy.  Colon: Normal appendix. Colonic diverticulosis. There is no diverticulitis.  Pelvic Genitourinary: Urinary bladder within normal limits. Prostatomegaly with 52 mm transverse prostate span. No free fluid.  Bones: Posterior lumbar interbody fusion. No aggressive osseous lesions.  Vasculature: Atherosclerosis without gross acute vascular abnormality.  Body Wall: Tiny fat containing periumbilical hernia.  IMPRESSION: 1. Hydropic gallbladder with calcified gallstones and mild stranding suspicious for acute cholecystitis. Consider correlation with ultrasound. 2. Possible tiny calcified stone in the proximal common bile duct. 3. Low attenuation in the liver adjacent to the gallbladder fossa may represent hepatic edema in the setting of cholecystitis or focal fatty infiltration. 4. Small hiatal hernia. 5. Renal cysts.   Electronically Signed   By: Arvilla Market.D.  On: 12/05/2013 17:37   Dg Chest Port 1 View  12/06/2013   CLINICAL DATA:  Respiratory distress after cholecystectomy.  EXAM: PORTABLE CHEST - 1 VIEW  COMPARISON:  DG CHEST 2 VIEW dated 02/14/2012  FINDINGS: Lung volumes are very low with  bibasilar atelectasis present, right greater than left. No overt edema or focal airspace consolidation is seen. The heart is enlarged and patient has had prior CABG. No evidence of pneumothorax.  IMPRESSION: Very low lung volumes with bibasilar atelectasis postoperatively.   Electronically Signed   By: Aletta Edouard M.D.   On: 12/06/2013 17:11   Dg Ercp Biliary & Pancreatic Ducts  12/06/2013   CLINICAL DATA:  CBD stone  EXAM: ERCP  TECHNIQUE: Multiple spot images obtained with the fluoroscopic device and submitted for interpretation post-procedure.  COMPARISON:  CT ABD/PELV WO CM dated 12/05/2013  FINDINGS: A single spot intraoperative radiographic images of the right upper quadrant during ERCP is provided for review.  Image demonstrates an ERCP probe overlying the right upper abdominal quadrant with selective cannulation and opacification of the common bile duct which appears nondilated. No discrete filling defects are seen within the common bile duct given obliquity.  There is minimal opacification of the central aspect of the intrahepatic biliary tree which appears nondilated.  There is minimal opacification of the central aspect of the cystic duct and the neck of the gallbladder. The known stones within the neck of the gallbladder and cystic duct are not well demonstrated on present examination secondary to underdistention.  There is no definitive opacification of the pancreatic duct.  IMPRESSION: ERCP as above.  These images were submitted for radiologic interpretation only. Please see the procedural report for the amount of contrast and the fluoroscopy time utilized.   Electronically Signed   By: Sandi Mariscal M.D.   On: 12/06/2013 16:27     Medications: Scheduled Meds: . carvedilol  20 mg Oral Daily  . insulin aspart  0-5 Units Subcutaneous QHS  . insulin aspart  0-9 Units Subcutaneous TID WC  . piperacillin-tazobactam (ZOSYN)  IV  3.375 g Intravenous 3 times per day   Continuous Infusions: .  sodium chloride 125 mL/hr at 12/07/13 0638   PRN Meds:.morphine injection, ondansetron (ZOFRAN) IV, ondansetron    Assessment/Plan: 80 y.o. male with acute cholecystitis causing RUQ abscess s/p lap, drain placement  He looks remarkably well given what he has been through.  ERCP with biliary sphincterotomy (stone fragment removal).  Sphincterotomy should help if he has biliary leak now following abscess drainage, laparoscopy.    Milus Banister, MD  12/07/2013, 8:03 AM [facility] Pager [phone]

## 2013-12-07 NOTE — Progress Notes (Signed)
Noted foley cath without any urine..stood patient up and he started to void through condom catheter approx 674ml. Attempted to bladder scan to check for residual and it registered 847ml per tech. Inserted foley cath and 219ml of urine returned. Foley left in until further eval. Tol. Procedure.

## 2013-12-08 ENCOUNTER — Inpatient Hospital Stay (HOSPITAL_COMMUNITY): Payer: Medicare Other

## 2013-12-08 ENCOUNTER — Encounter (HOSPITAL_COMMUNITY): Payer: Self-pay | Admitting: General Practice

## 2013-12-08 LAB — COMPREHENSIVE METABOLIC PANEL
ALK PHOS: 160 U/L — AB (ref 39–117)
ALT: 28 U/L (ref 0–53)
AST: 24 U/L (ref 0–37)
Albumin: 1.7 g/dL — ABNORMAL LOW (ref 3.5–5.2)
BUN: 41 mg/dL — AB (ref 6–23)
CO2: 17 mEq/L — ABNORMAL LOW (ref 19–32)
Calcium: 7.8 mg/dL — ABNORMAL LOW (ref 8.4–10.5)
Chloride: 106 mEq/L (ref 96–112)
Creatinine, Ser: 1.62 mg/dL — ABNORMAL HIGH (ref 0.50–1.35)
GFR calc Af Amer: 44 mL/min — ABNORMAL LOW (ref >90)
GFR calc non Af Amer: 38 mL/min — ABNORMAL LOW (ref >90)
Glucose, Bld: 89 mg/dL (ref 70–99)
Potassium: 4.2 mEq/L (ref 3.7–5.3)
Sodium: 138 mEq/L (ref 137–147)
Total Bilirubin: 1.4 mg/dL — ABNORMAL HIGH (ref 0.3–1.2)
Total Protein: 5.5 g/dL — ABNORMAL LOW (ref 6.0–8.3)

## 2013-12-08 LAB — CBC
HEMATOCRIT: 25.7 % — AB (ref 39.0–52.0)
Hemoglobin: 8.8 g/dL — ABNORMAL LOW (ref 13.0–17.0)
MCH: 32.7 pg (ref 26.0–34.0)
MCHC: 34.2 g/dL (ref 30.0–36.0)
MCV: 95.5 fL (ref 78.0–100.0)
Platelets: 235 10*3/uL (ref 150–400)
RBC: 2.69 MIL/uL — ABNORMAL LOW (ref 4.22–5.81)
RDW: 14.7 % (ref 11.5–15.5)
WBC: 15 10*3/uL — AB (ref 4.0–10.5)

## 2013-12-08 LAB — GLUCOSE, CAPILLARY
GLUCOSE-CAPILLARY: 91 mg/dL (ref 70–99)
GLUCOSE-CAPILLARY: 89 mg/dL (ref 70–99)
GLUCOSE-CAPILLARY: 79 mg/dL (ref 70–99)
Glucose-Capillary: 84 mg/dL (ref 70–99)
Glucose-Capillary: 116 mg/dL — ABNORMAL HIGH (ref 70–99)
Glucose-Capillary: 120 mg/dL — ABNORMAL HIGH (ref 70–99)

## 2013-12-08 MED ORDER — FAMOTIDINE 20 MG PO TABS
20.0000 mg | ORAL_TABLET | Freq: Two times a day (BID) | ORAL | Status: DC | PRN
  Administered 2013-12-08 – 2013-12-12 (×4): 20 mg via ORAL
  Filled 2013-12-08 (×5): qty 1

## 2013-12-08 NOTE — Progress Notes (Signed)
Bleeding noted around the #2 JP site , dressing was reinfored, MD was paged.

## 2013-12-08 NOTE — Progress Notes (Signed)
[name] - Stepdown / ICU Progress Note  [name] [name] PZW:[id number] DOB: 11-18-1928 DOA: 12/05/2013 PCP: Elsie Stain, MD  Time spent : 34mins  Brief narrative: 80 year old male patient with multiple medical problems including coronary artery disease and diabetes. Presented to the hospital with complaints of significant right upper quadrant pain for 2 weeks. This was associated with nausea and weakness. He apparently had been seen by his primary care provider 2 weeks prior for similar symptoms and was treated for musculoskeletal strain. No apparent fevers or chills at home. Since that time he has had progression in his symptoms and increased anorexia. He also now has right shoulder pain. Since that initial visit he has developed bilious vomiting and has fallen at least twice at home.  Upon evaluation in the ER he was found to have a white count of 42,000. CT of the abdomen and pelvis revealed an enlarged gallbladder with calcified gallstones and stranding concerning for acute cholecystitis. There is possibly a tiny calcified stone in the proximal bile duct. He also was noted to have acute renal failure w/ creatinine of 2.35 noting his baseline creatinine 1.2 back in February. He had mild transaminitis with an elevated alkaline phosphatase and only mildly elevated total bilirubin. General Surgery was contacted by the ER physician. Given his cardiac history preoperative clearance was recommended. In addition to the above findings patient had very soft blood pressures with systolic in the 42P despite IV fluids.  HPI/Subjective: Primarily endorsing operative site pain as well as fullness after eating.  No sob or cp.    Assessment/Plan:  Acute cholecystitis/Choledocholithiasis -s/p attempted lap chole - due to perforated GB underwent diag laparoscopy with drainage of RUQ abscess -follow Blake drains x 2- plan is eventual cholecystectomy  -Continue empiric antibiotics - WBCs cont to  trend down -also underwent ERCP with stone retrieval + sphincterotomy by Dr. Ardis Hughs -Statin on hold given acute transaminitis -now with mild gaseous distention of stomach but no ileus on KUB today - surgery has advanced diet to Fulls -having more surgical site pain today - taught splinting maneuver - due to renal function can't use Toradol  Sepsis -Secondary to acute cholecystitis -Continue empiric antibiotics and supportive care -BP improved -sepsis physiology has resolved   CAD -Cardiology consulted for surgical clearance -Currently no acute ischemic symptoms noted on exam or endorsed by the patient -BP soft so carvedilol on hold  Hypotension -BP remains soft noting baseline readings systolic between 536 and 144 -related to sepsis and dehydration -Hold offending medications  Acute renal failure on CKD (chronic kidney disease), stage III -BUN and creatinine slowly trending downward -Continue IV fluids -Suspect related to sepsis so correct infectious processes -Also appears to have a degree of ATN related to underlying use of ACE inhibitor prior to admission  ? OSA -diff to extubate post op - may simple be reflection of severity of illness, or indicative of occult OSA -consider outpt eval as indicated  HYPOTHYROIDISM, BORDERLINE -Was not on medications prior to admission  DIABETES MELLITUS, TYPE II -CBGs controlled and less than 200 -Continue sliding scale insulin  HYPERTENSION  DVT prophylaxis: SCDs - transition to pharmacological prophylaxis at discretion of surgical team Code Status: Full Family Communication: Family at bedside Disposition Plan/Expected LOS: stable for transfer to med/surg floor   Consultants: Gen. Surgery Gastroenterology Cardiology  Procedures: ERCP   Exploratory Laparoscopy w/ drainage placement x2  Antibiotics: Zosyn 3/29 >>> Vancomycin 3/29    Objective: Blood pressure 137/77, pulse 77,  temperature 97.7 F (36.5 C), temperature  source Oral, resp. rate 28, height 5\' 11"  (1.803 m), weight 196 lb 3.4 oz (89 kg), SpO2 93.00%.  Intake/Output Summary (Last 24 hours) at 12/08/13 1438 Last data filed at 12/08/13 1400  Gross per 24 hour  Intake   4495 ml  Output   2730 ml  Net   1765 ml   Exam: General: No acute respiratory distress Lungs: Clear to auscultation bilaterally without wheezes or crackles, 2L Cardiovascular: Regular rate and rhythm without murmur gallop or rub normal S1 and S2, no peripheral edema or JVD Abdomen: Tender over trocar sites and somewhat in RUQ, more distended, soft, bowel sounds positive, no rebound, no ascites, no appreciable mass; Blake Drains x 2 Musculoskeletal: No significant cyanosis, clubbing of bilateral lower extremities Neurological: Alert and oriented x 3, moves all extremities x 4 without focal neurological deficits, CN 2-12 intact  Scheduled Meds:  Scheduled Meds: . carvedilol  20 mg Oral Daily  . insulin aspart  0-5 Units Subcutaneous QHS  . insulin aspart  0-9 Units Subcutaneous TID WC  . piperacillin-tazobactam (ZOSYN)  IV  3.375 g Intravenous 3 times per day   Data Reviewed: Basic Metabolic Panel:  Recent Labs Lab 12/05/13 1452 12/06/13 0315 12/07/13 1130 12/08/13 0245  NA 134* 136* 138 138  K 4.7 5.1 4.8 4.2  CL 93* 102 106 106  CO2 19 17* 18* 17*  GLUCOSE 232* 85 153* 89  BUN 22 28* 40* 41*  CREATININE 2.35* 2.11* 1.85* 1.62*  CALCIUM 9.5 8.2* 8.0* 7.8*   Liver Function Tests:  Recent Labs Lab 12/05/13 1452 12/06/13 0315 12/07/13 1130 12/08/13 0245  AST 54* 47* 31 24  ALT 64* 54* 36 28  ALKPHOS 270* 227* 130* 160*  BILITOT 1.5* 1.5* 1.5* 1.4*  PROT 7.2 6.1 5.9* 5.5*  ALBUMIN 2.3* 2.0* 1.9* 1.7*    Recent Labs Lab 12/05/13 1452 12/06/13 0315  LIPASE 22 15   CBC:  Recent Labs Lab 12/05/13 1452 12/06/13 0315 12/07/13 0328 12/08/13 0245  WBC 42.8* 23.3* 18.8* 15.0*  NEUTROABS 40.2* 22.8* 17.2*  --   HGB 13.1 10.6* 9.5* 8.8*  HCT 36.8*  30.1* 27.9* 25.7*  MCV 96.3 94.4 96.2 95.5  PLT 327 271 259 235   CBG:  Recent Labs Lab 12/07/13 1138 12/07/13 1653 12/07/13 2221 12/08/13 0836 12/08/13 1241  GLUCAP 162* 99 91 84 89    Recent Results (from the past 240 hour(s))  MRSA PCR SCREENING     Status: None   Collection Time    12/05/13  8:30 PM      Result Value Ref Range Status   MRSA by PCR NEGATIVE  NEGATIVE Final   Comment:            The GeneXpert MRSA Assay (FDA     approved for NASAL specimens     only), is one component of a     comprehensive MRSA colonization     surveillance program. It is not     intended to diagnose MRSA     infection nor to guide or     monitor treatment for     MRSA infections.  CULTURE, BLOOD (ROUTINE X 2)     Status: None   Collection Time    12/05/13  9:00 PM      Result Value Ref Range Status   Specimen Description BLOOD LEFT ARM   Final   Special Requests     Final   Value:  BOTTLES DRAWN AEROBIC AND ANAEROBIC 10CC AEROBIC 8CC ANAEROBIC   Culture  Setup Time     Final   Value: 12/06/2013 01:37     Performed at [facility]   Culture     Final   Value:        BLOOD CULTURE RECEIVED NO GROWTH TO DATE CULTURE WILL BE HELD FOR 5 DAYS BEFORE ISSUING A FINAL NEGATIVE REPORT     Performed at [facility]   Report Status PENDING   Incomplete  CULTURE, BLOOD (ROUTINE X 2)     Status: None   Collection Time    12/05/13  9:30 PM      Result Value Ref Range Status   Specimen Description BLOOD LEFT HAND   Final   Special Requests BOTTLES DRAWN AEROBIC ONLY 5CC   Final   Culture  Setup Time     Final   Value: 12/06/2013 01:36     Performed at [facility]   Culture     Final   Value:        BLOOD CULTURE RECEIVED NO GROWTH TO DATE CULTURE WILL BE HELD FOR 5 DAYS BEFORE ISSUING A FINAL NEGATIVE REPORT     Performed at [facility]   Report Status PENDING   Incomplete  URINE CULTURE     Status: None   Collection Time    12/06/13  2:24 AM       Result Value Ref Range Status   Specimen Description URINE, CLEAN CATCH   Final   Special Requests NONE   Final   Culture  Setup Time     Final   Value: 12/06/2013 09:13     Performed at [facility]     Final   Value: 1,000 COLONIES/ML     Performed at [facility]   Culture     Final   Value: INSIGNIFICANT GROWTH     Performed at [facility]   Report Status 12/07/2013 FINAL   Final     Studies:  Recent x-ray studies have been reviewed in detail by the Attending Physician      Erin Hearing, [place] [facility] Office  [phone] Pager [phone]  **If unable to reach the above provider after paging please contact the [facility] @ [phone]  On-Call/Text Page:      [email or web address]      password [email or web address]  If 7PM-7AM, please contact night-coverage [email or web address] Password [email or web address] 12/08/2013, 2:38 PM   LOS: 3 days   I have personally examined this patient and reviewed the entire database. I have reviewed the above note, made any necessary editorial changes, and agree with its content.  Cherene Altes, MD [facility]

## 2013-12-08 NOTE — Progress Notes (Signed)
2 Days Post-Op  Subjective: Some ruq pain, no real nausea with eating yesterday, some flatus  Objective: Vital signs in last 24 hours: Temp:  [98.2 F (36.8 C)-98.6 F (37 C)] 98.2 F (36.8 C) (04/01 0837) Pulse Rate:  [76-85] 80 (04/01 0837) Resp:  [20-26] 22 (04/01 0837) BP: (104-131)/(58-67) 131/67 mmHg (04/01 0837) SpO2:  [92 %-97 %] 92 % (04/01 0837) Weight:  [196 lb 3.4 oz (89 kg)] 196 lb 3.4 oz (89 kg) (04/01 0442) Last BM Date: 12/05/13  Intake/Output from previous day: 03/31 0701 - 04/01 0700 In: 3175 [P.O.:100; I.V.:2875; IV Piggyback:150] Out: 2125 [Urine:2075; Drains:50] Intake/Output this shift:    General appearance: no distress GI: soft some bs incisions clean drains with serosang output  Lab Results:   Recent Labs  12/07/13 0328 12/08/13 0245  WBC 18.8* 15.0*  HGB 9.5* 8.8*  HCT 27.9* 25.7*  PLT 259 235   BMET  Recent Labs  12/07/13 1130 12/08/13 0245  NA 138 138  K 4.8 4.2  CL 106 106  CO2 18* 17*  GLUCOSE 153* 89  BUN 40* 41*  CREATININE 1.85* 1.62*  CALCIUM 8.0* 7.8*   PT/INR No results found for this basename: LABPROT, INR,  in the last 72 hours ABG  Recent Labs  12/06/13 1725  PHART 7.299*  HCO3 18.9*    Studies/Results: Dg Chest Port 1 View  12/06/2013   CLINICAL DATA:  Respiratory distress after cholecystectomy.  EXAM: PORTABLE CHEST - 1 VIEW  COMPARISON:  DG CHEST 2 VIEW dated 02/14/2012  FINDINGS: Lung volumes are very low with bibasilar atelectasis present, right greater than left. No overt edema or focal airspace consolidation is seen. The heart is enlarged and patient has had prior CABG. No evidence of pneumothorax.  IMPRESSION: Very low lung volumes with bibasilar atelectasis postoperatively.   Electronically Signed   By: Aletta Edouard M.D.   On: 12/06/2013 17:11   Dg Ercp Biliary & Pancreatic Ducts  12/06/2013   CLINICAL DATA:  CBD stone  EXAM: ERCP  TECHNIQUE: Multiple spot images obtained with the fluoroscopic device  and submitted for interpretation post-procedure.  COMPARISON:  CT ABD/PELV WO CM dated 12/05/2013  FINDINGS: A single spot intraoperative radiographic images of the right upper quadrant during ERCP is provided for review.  Image demonstrates an ERCP probe overlying the right upper abdominal quadrant with selective cannulation and opacification of the common bile duct which appears nondilated. No discrete filling defects are seen within the common bile duct given obliquity.  There is minimal opacification of the central aspect of the intrahepatic biliary tree which appears nondilated.  There is minimal opacification of the central aspect of the cystic duct and the neck of the gallbladder. The known stones within the neck of the gallbladder and cystic duct are not well demonstrated on present examination secondary to underdistention.  There is no definitive opacification of the pancreatic duct.  IMPRESSION: ERCP as above.  These images were submitted for radiologic interpretation only. Please see the procedural report for the amount of contrast and the fluoroscopy time utilized.   Electronically Signed   By: Sandi Mariscal M.D.   On: 12/06/2013 16:27    Anti-infectives: Anti-infectives   Start     Dose/Rate Route Frequency Ordered Stop   12/06/13 1200  piperacillin-tazobactam (ZOSYN) IVPB 3.375 g  Status:  Discontinued     3.375 g 100 mL/hr over 30 Minutes Intravenous 4 times per day 12/06/13 0924 12/06/13 0933   12/06/13 0945  piperacillin-tazobactam (  ZOSYN) IVPB 3.375 g     3.375 g 12.5 mL/hr over 240 Minutes Intravenous 3 times per day 12/06/13 0935     12/05/13 1900  vancomycin (VANCOCIN) IVPB 1000 mg/200 mL premix     1,000 mg 200 mL/hr over 60 Minutes Intravenous  Once 12/05/13 1849 12/05/13 2038   12/05/13 1815  piperacillin-tazobactam (ZOSYN) IVPB 3.375 g     3.375 g 100 mL/hr over 30 Minutes Intravenous  Once 12/05/13 1808 12/05/13 1942      Assessment/Plan: POD 2 ercp/dx lsc with drainage  ruq abscess  He is doing pretty well this am, clinically looks well, drain with expected output, wbc still improving.  I think will recheck cbc in am.   If improves will continue abx and drains with consideration of chole later  If does not improve will reimage  Can give fulls today   [facility] 12/08/2013

## 2013-12-08 NOTE — Progress Notes (Addendum)
Clinical Social Work Department CLINICAL SOCIAL WORK PLACEMENT NOTE 12/08/2013  Patient:  [name] [name], [name] [name]  Account Number:  [id number] Admit date:  12/05/2013  Clinical Social Worker:  Ky Barban, Latanya Presser  Date/time:  12/08/2013 01:19 PM  Clinical Social Work is seeking post-discharge placement for this patient at the following level of care:   [place]   (*CSW will update this form in Epic as items are completed)   12/08/2013  Patient/family provided with [facility] Department of Clinical Social Work's list of facilities offering this level of care within the geographic area requested by the patient (or if unable, by the patient's family).  12/08/2013  Patient/family informed of their freedom to choose among providers that offer the needed level of care, that participate in Medicare, Medicaid or managed care program needed by the patient, have an available bed and are willing to accept the patient.  12/08/2013  Patient/family informed of [facility]' ownership interest in [facility], as well as of the fact that they are under no obligation to receive care at this facility.  PASARR submitted to EDS on 12/08/2013 PASARR number received from EDS on 12/08/2013  FL2 transmitted to all facilities in geographic area requested by pt/family on  12/08/2013 FL2 transmitted to all facilities within larger geographic area on   Patient informed that his/her managed care company has contracts with or will negotiate with  certain facilities, including the following:     Patient/family informed of bed offers received:  12/08/2013 Patient chooses bed at [facility] Physician recommends and patient chooses bed at    Patient to be transferred to [facility] on   Patient to be transferred to facility by   The following physician request were entered in Epic:   Additional Comments:   Ky Barban, MSW, [place] Worker [phone]

## 2013-12-08 NOTE — Progress Notes (Signed)
Clinical Social Work Department BRIEF PSYCHOSOCIAL ASSESSMENT 12/08/2013  Patient:  [name] [name], [name] [name]     Account Number:  [id number]     Admit date:  12/05/2013  Clinical Social Worker:  Freeman Caldron  Date/Time:  12/08/2013 01:16 PM  Referred by:  Physician  Date Referred:  12/08/2013 Referred for  SNF Placement   Other Referral:   Interview type:  Family Other interview type:    PSYCHOSOCIAL DATA Living Status:  FAMILY Admitted from facility:   Level of care:   Primary support name:  [name] [phone] Primary support relationship to patient:  CHILD, ADULT Degree of support available:   Good--pt lives with spouse, and daughter [name] provides support.    CURRENT CONCERNS Current Concerns  Post-Acute Placement   Other Concerns:    SOCIAL WORK ASSESSMENT / PLAN Pt sleeping when CSW went to room. Spoke with daughter [name] about recommendation for SNF at discharge. [name] agrees with recommendation and asked referrals made to [facility] (first choice), SNFs in [place], and SNFs in [place]. Referrals have been made. Will provide bed offers to daughter [name].   Assessment/plan status:  Psychosocial Support/Ongoing Assessment of Needs Other assessment/ plan:   Information/referral to community resources:   SNF ([place])    PATIENT'S/FAMILY'S RESPONSE TO PLAN OF CARE: Good--daughter engaged in conversation with CSW. Daughter agrees with recommendation for SNF.       Ky Barban, MSW, [facility] Clinical Social Worker [phone]

## 2013-12-09 ENCOUNTER — Inpatient Hospital Stay (HOSPITAL_COMMUNITY): Payer: Medicare Other

## 2013-12-09 DIAGNOSIS — J189 Pneumonia, unspecified organism: Secondary | ICD-10-CM

## 2013-12-09 DIAGNOSIS — E119 Type 2 diabetes mellitus without complications: Secondary | ICD-10-CM

## 2013-12-09 DIAGNOSIS — R062 Wheezing: Secondary | ICD-10-CM

## 2013-12-09 DIAGNOSIS — N179 Acute kidney failure, unspecified: Secondary | ICD-10-CM

## 2013-12-09 DIAGNOSIS — I10 Essential (primary) hypertension: Secondary | ICD-10-CM

## 2013-12-09 HISTORY — DX: Pneumonia, unspecified organism: J18.9

## 2013-12-09 LAB — CBC
HCT: 28.7 % — ABNORMAL LOW (ref 39.0–52.0)
Hemoglobin: 9.7 g/dL — ABNORMAL LOW (ref 13.0–17.0)
MCH: 32.6 pg (ref 26.0–34.0)
MCHC: 33.8 g/dL (ref 30.0–36.0)
MCV: 96.3 fL (ref 78.0–100.0)
Platelets: 274 10*3/uL (ref 150–400)
RBC: 2.98 MIL/uL — AB (ref 4.22–5.81)
RDW: 14.8 % (ref 11.5–15.5)
WBC: 12.9 10*3/uL — ABNORMAL HIGH (ref 4.0–10.5)

## 2013-12-09 LAB — GLUCOSE, CAPILLARY
GLUCOSE-CAPILLARY: 110 mg/dL — AB (ref 70–99)
Glucose-Capillary: 109 mg/dL — ABNORMAL HIGH (ref 70–99)
Glucose-Capillary: 130 mg/dL — ABNORMAL HIGH (ref 70–99)
Glucose-Capillary: 124 mg/dL — ABNORMAL HIGH (ref 70–99)

## 2013-12-09 LAB — COMPREHENSIVE METABOLIC PANEL
ALBUMIN: 1.6 g/dL — AB (ref 3.5–5.2)
ALT: 22 U/L (ref 0–53)
AST: 20 U/L (ref 0–37)
Alkaline Phosphatase: 124 U/L — ABNORMAL HIGH (ref 39–117)
BUN: 41 mg/dL — ABNORMAL HIGH (ref 6–23)
CO2: 16 meq/L — AB (ref 19–32)
Calcium: 8.3 mg/dL — ABNORMAL LOW (ref 8.4–10.5)
Chloride: 109 mEq/L (ref 96–112)
Creatinine, Ser: 1.33 mg/dL (ref 0.50–1.35)
GFR calc Af Amer: 56 mL/min — ABNORMAL LOW (ref >90)
GFR, EST NON AFRICAN AMERICAN: 48 mL/min — AB (ref >90)
Glucose, Bld: 115 mg/dL — ABNORMAL HIGH (ref 70–99)
POTASSIUM: 4.5 meq/L (ref 3.7–5.3)
SODIUM: 140 meq/L (ref 137–147)
Total Bilirubin: 1.2 mg/dL (ref 0.3–1.2)
Total Protein: 5.8 g/dL — ABNORMAL LOW (ref 6.0–8.3)

## 2013-12-09 MED ORDER — ALBUTEROL SULFATE (2.5 MG/3ML) 0.083% IN NEBU
2.5000 mg | INHALATION_SOLUTION | Freq: Four times a day (QID) | RESPIRATORY_TRACT | Status: DC
  Administered 2013-12-09 – 2013-12-11 (×8): 2.5 mg via RESPIRATORY_TRACT
  Filled 2013-12-09 (×8): qty 3

## 2013-12-09 MED ORDER — LISINOPRIL 10 MG PO TABS
10.0000 mg | ORAL_TABLET | Freq: Every day | ORAL | Status: DC
  Administered 2013-12-09 – 2013-12-15 (×7): 10 mg via ORAL
  Filled 2013-12-09 (×3): qty 1
  Filled 2013-12-09: qty 2
  Filled 2013-12-09 (×6): qty 1

## 2013-12-09 MED ORDER — METOCLOPRAMIDE HCL 5 MG/ML IJ SOLN
5.0000 mg | Freq: Three times a day (TID) | INTRAMUSCULAR | Status: DC
  Administered 2013-12-09 – 2013-12-10 (×5): 5 mg via INTRAVENOUS
  Filled 2013-12-09 (×3): qty 2
  Filled 2013-12-09 (×3): qty 1

## 2013-12-09 MED ORDER — ALBUTEROL SULFATE (2.5 MG/3ML) 0.083% IN NEBU
2.5000 mg | INHALATION_SOLUTION | RESPIRATORY_TRACT | Status: DC | PRN
  Administered 2013-12-09: 2.5 mg via RESPIRATORY_TRACT
  Filled 2013-12-09 (×2): qty 3

## 2013-12-09 NOTE — Progress Notes (Signed)
Chart reviewed.  Progress Note  [name] [name] KGU:[id number] DOB: 07-16-1929 DOA: 12/05/2013 PCP: Elsie Stain, MD  Time spent : 26mins  Brief narrative: 80 year old male patient with multiple medical problems including coronary artery disease and diabetes. Presented to the hospital with complaints of significant right upper quadrant pain for 2 weeks. This was associated with nausea and weakness. He apparently had been seen by his primary care provider 2 weeks prior for similar symptoms and was treated for musculoskeletal strain. No apparent fevers or chills at home. Since that time he has had progression in his symptoms and increased anorexia. He also now has right shoulder pain. Since that initial visit he has developed bilious vomiting and has fallen at least twice at home.  Upon evaluation in the ER he was found to have a white count of 42,000. CT of the abdomen and pelvis revealed an enlarged gallbladder with calcified gallstones and stranding concerning for acute cholecystitis. There is possibly a tiny calcified stone in the proximal bile duct. He also was noted to have acute renal failure w/ creatinine of 2.35 noting his baseline creatinine 1.2 back in February. He had mild transaminitis with an elevated alkaline phosphatase and only mildly elevated total bilirubin. General Surgery was contacted by the ER physician. Given his cardiac history preoperative clearance was recommended. In addition to the above findings patient had very soft blood pressures with systolic in the 62G despite IV fluids.  HPI/Subjective: C/o wheeze, dyspnea, cough productive of yellow sputum this am. Better after HHN. No h/o COPD of asthma.  Having flatus. No BM. Not eating much. Stays nauseated.   Assessment/Plan:  Acute cholecystitis/right upper quadrant abscess/Choledocholithiasis -s/p attempted lap chole - due to perforated GB underwent diag laparoscopy with drainage of RUQ abscess -follow Blake drains x 2-  plan is eventual cholecystectomy  -Continue empiric antibiotics - WBCs cont to trend down -also underwent ERCP with stone retrieval + sphincterotomy by Dr. Ardis Hughs  Sepsis Resolved  Karsten Fells:  Patient reports improvement after nebulizer. Will schedule 4 times a day for now. Chest x-ray shows left-sided atelectasis versus infiltrate. Could be HCAP, as he does have cough. Continue Zosyn. Supportive care.  Nausea: No vomiting. Patient reports that a friend is not really helping. will schedule low-dose Reglan before meals short-term. Patient is still quite distended. Bowel movement yet but is having flatus.  CAD  Acute renal failure on CKD (chronic kidney disease), stage III Resolving  HYPOTHYROIDISM, BORDERLINE -Was not on medications prior to admission  DIABETES MELLITUS, TYPE II Controled  HYPERTENSION Blood pressure increasing. Will resume ACE inhibitor at lower dose now that creatinine normalizing.  Increased activity. Physical therapy evaluation.  DVT prophylaxis: SCDs - transition to pharmacological prophylaxis at discretion of surgical team Code Status: Full Family Communication: Family at bedside Disposition Plan/Expected LOS:   Consultants: Gen. Surgery Gastroenterology Cardiology  Procedures: ERCP   Exploratory Laparoscopy w/ drainage placement x2  Antibiotics: Zosyn 3/29 >>> Vancomycin 3/29    Objective: Blood pressure 164/72, pulse 75, temperature 98.2 F (36.8 C), temperature source Oral, resp. rate 20, height 5\' 11"  (1.803 m), weight 91.1 kg (200 lb 13.4 oz), SpO2 96.00%.  Intake/Output Summary (Last 24 hours) at 12/09/13 1035 Last data filed at 12/09/13 0903  Gross per 24 hour  Intake   2530 ml  Output   1900 ml  Net    630 ml   Exam: General: Appears comfortable Lungs: Good air movement. Minimal congestion and wheeze on the left. Right lung field clear. Cardiovascular:  Regular rate and rhythm without murmur gallop or rub normal S1 and  S2,  Abdomen: Distended. Soft. Drains in place. Musculoskeletal: No significant cyanosis, clubbing of bilateral lower extremities  Scheduled Meds:  Scheduled Meds: . carvedilol  20 mg Oral Daily  . insulin aspart  0-5 Units Subcutaneous QHS  . insulin aspart  0-9 Units Subcutaneous TID WC  . piperacillin-tazobactam (ZOSYN)  IV  3.375 g Intravenous 3 times per day   Data Reviewed: Basic Metabolic Panel:  Recent Labs Lab 12/05/13 1452 12/06/13 0315 12/07/13 1130 12/08/13 0245 12/09/13 0554  NA 134* 136* 138 138 140  K 4.7 5.1 4.8 4.2 4.5  CL 93* 102 106 106 109  CO2 19 17* 18* 17* 16*  GLUCOSE 232* 85 153* 89 115*  BUN 22 28* 40* 41* 41*  CREATININE 2.35* 2.11* 1.85* 1.62* 1.33  CALCIUM 9.5 8.2* 8.0* 7.8* 8.3*   Liver Function Tests:  Recent Labs Lab 12/05/13 1452 12/06/13 0315 12/07/13 1130 12/08/13 0245 12/09/13 0554  AST 54* 47* 31 24 20   ALT 64* 54* 36 28 22  ALKPHOS 270* 227* 130* 160* 124*  BILITOT 1.5* 1.5* 1.5* 1.4* 1.2  PROT 7.2 6.1 5.9* 5.5* 5.8*  ALBUMIN 2.3* 2.0* 1.9* 1.7* 1.6*    Recent Labs Lab 12/05/13 1452 12/06/13 0315  LIPASE 22 15   CBC:  Recent Labs Lab 12/05/13 1452 12/06/13 0315 12/07/13 0328 12/08/13 0245 12/09/13 0554  WBC 42.8* 23.3* 18.8* 15.0* 12.9*  NEUTROABS 40.2* 22.8* 17.2*  --   --   HGB 13.1 10.6* 9.5* 8.8* 9.7*  HCT 36.8* 30.1* 27.9* 25.7* 28.7*  MCV 96.3 94.4 96.2 95.5 96.3  PLT 327 271 259 235 274   CBG:  Recent Labs Lab 12/08/13 1241 12/08/13 1645 12/08/13 1750 12/08/13 2145 12/09/13 0711  GLUCAP 89 79 116* 120* 109*    Recent Results (from the past 240 hour(s))  MRSA PCR SCREENING     Status: None   Collection Time    12/05/13  8:30 PM      Result Value Ref Range Status   MRSA by PCR NEGATIVE  NEGATIVE Final   Comment:            The GeneXpert MRSA Assay (FDA     approved for NASAL specimens     only), is one component of a     comprehensive MRSA colonization     surveillance program. It is  not     intended to diagnose MRSA     infection nor to guide or     monitor treatment for     MRSA infections.  CULTURE, BLOOD (ROUTINE X 2)     Status: None   Collection Time    12/05/13  9:00 PM      Result Value Ref Range Status   Specimen Description BLOOD LEFT ARM   Final   Special Requests     Final   Value: BOTTLES DRAWN AEROBIC AND ANAEROBIC 10CC AEROBIC 8CC ANAEROBIC   Culture  Setup Time     Final   Value: 12/06/2013 01:37     Performed at [facility]   Culture     Final   Value:        BLOOD CULTURE RECEIVED NO GROWTH TO DATE CULTURE WILL BE HELD FOR 5 DAYS BEFORE ISSUING A FINAL NEGATIVE REPORT     Performed at [facility]   Report Status PENDING   Incomplete  CULTURE, BLOOD (ROUTINE X 2)  Status: None   Collection Time    12/05/13  9:30 PM      Result Value Ref Range Status   Specimen Description BLOOD LEFT HAND   Final   Special Requests BOTTLES DRAWN AEROBIC ONLY 5CC   Final   Culture  Setup Time     Final   Value: 12/06/2013 01:36     Performed at [facility]   Culture     Final   Value:        BLOOD CULTURE RECEIVED NO GROWTH TO DATE CULTURE WILL BE HELD FOR 5 DAYS BEFORE ISSUING A FINAL NEGATIVE REPORT     Performed at [facility]   Report Status PENDING   Incomplete  URINE CULTURE     Status: None   Collection Time    12/06/13  2:24 AM      Result Value Ref Range Status   Specimen Description URINE, CLEAN CATCH   Final   Special Requests NONE   Final   Culture  Setup Time     Final   Value: 12/06/2013 09:13     Performed at [place]     Final   Value: 1,000 COLONIES/ML     Performed at [facility]   Culture     Final   Value: INSIGNIFICANT GROWTH     Performed at [facility]   Report Status 12/07/2013 FINAL   Final     Studies:  CLINICAL DATA: Abdominal distension and wheezing.  EXAM:  ACUTE ABDOMEN SERIES (ABDOMEN 2 VIEW & CHEST 1 VIEW)  COMPARISON: DG ABD  PORTABLE 1V dated 12/08/2013; CT ABD/PELV WO CM  dated 12/05/2013  FINDINGS:  Low lung volumes. Cardiac silhouette is enlarged. Areas of linear  density project within the left lung base.  Residual contrast is appreciated within the colon. Air is seen  within nondilated loops of small bowel. Surgical drain is  appreciated within the right upper quadrant. Fusion of the lower  lumbar spine is demonstrated. Osseous structures otherwise  unremarkable.  IMPRESSION:  Likely atelectasis left lung base and infiltrate cannot be excluded.  Nonobstructive bowel gas pattern with a residua of radiopaque  contrast. Surgical drain right upper quadrant.   Doree Barthel, M.D. [facility] [phone]  If 7PM-7AM, please contact night-coverage [email or web address] Password [email or web address] 12/09/2013, 10:35 AM   LOS: 4 days

## 2013-12-09 NOTE — Progress Notes (Signed)
Patient ID: [name] [name], male   DOB: 1929-05-30, 80 y.o.   MRN: [id number] 3 Days Post-Op  Subjective: Pt does not feel well today.  C/o chest pain and SOB.  ("I can't breath")  He c/o pain with deep inspiration (expected).  He states that chest is actually in his epigastrium and it's the same pain he has been having, not new like some new event.  However, the inability to breath and the audible wheezing is new.  He c/o abdominal distention and reflux specifically with juices.  No real appetite, but passing flatus.  Objective: Vital signs in last 24 hours: Temp:  [97.7 F (36.5 C)-99.1 F (37.3 C)] 98.2 F (36.8 C) (04/02 0533) Pulse Rate:  [75-88] 75 (04/02 0533) Resp:  [20-28] 20 (04/02 0533) BP: (131-173)/(67-80) 164/72 mmHg (04/02 0533) SpO2:  [92 %-96 %] 96 % (04/02 0533) Weight:  [200 lb 13.4 oz (91.1 kg)] 200 lb 13.4 oz (91.1 kg) (04/02 0500) Last BM Date: 12/05/13  Intake/Output from previous day: 04/01 0701 - 04/02 0700 In: 2905 [P.O.:1310; I.V.:1545; IV Piggyback:50] Out: 1900 [KWIOX:7353; Drains:125] Intake/Output this shift:    PE: GEN: mild distress as he appears uncomfortable Heart: regular Lungs: audible wheezing, diffuse significant wheezing with auscultation. Abd: soft, but distended, +BS, JP drains both with serosang output, incisions are c/d/i  Lab Results:   Recent Labs  12/08/13 0245 12/09/13 0554  WBC 15.0* 12.9*  HGB 8.8* 9.7*  HCT 25.7* 28.7*  PLT 235 274   BMET  Recent Labs  12/08/13 0245 12/09/13 0554  NA 138 140  K 4.2 4.5  CL 106 109  CO2 17* 16*  GLUCOSE 89 115*  BUN 41* 41*  CREATININE 1.62* 1.33  CALCIUM 7.8* 8.3*   PT/INR No results found for this basename: LABPROT, INR,  in the last 72 hours CMP     Component Value Date/Time   NA 140 12/09/2013 0554   K 4.5 12/09/2013 0554   CL 109 12/09/2013 0554   CO2 16* 12/09/2013 0554   GLUCOSE 115* 12/09/2013 0554   BUN 41* 12/09/2013 0554   CREATININE 1.33 12/09/2013 0554   CALCIUM  8.3* 12/09/2013 0554   PROT 5.8* 12/09/2013 0554   ALBUMIN 1.6* 12/09/2013 0554   AST 20 12/09/2013 0554   ALT 22 12/09/2013 0554   ALKPHOS 124* 12/09/2013 0554   BILITOT 1.2 12/09/2013 0554   GFRNONAA 48* 12/09/2013 0554   GFRAA 56* 12/09/2013 0554   Lipase     Component Value Date/Time   LIPASE 15 12/06/2013 0315       Studies/Results: Dg Abd Portable 1v  12/08/2013   CLINICAL DATA:  80 year old male status post procedure with possible ileus. Initial encounter.  EXAM: PORTABLE ABDOMEN - 1 VIEW  COMPARISON:  ERCP images 330 15.  CT Abdomen and Pelvis 12/05/2013.  FINDINGS: Portable spine view of the abdomen at 1100 hrs. Contrast in nondilated colon. Two catheters project over the right abdomen, possibly postoperative drains. Mild gaseous distension of the body of the stomach. Paucity of small bowel gas. No other increased or dilated loops identified. Stable visualized osseous structures.  IMPRESSION: Non obstructed bowel gas pattern with stable mild gaseous distension of the stomach.   Electronically Signed   By: Lars Pinks M.D.   On: 12/08/2013 12:19    Anti-infectives: Anti-infectives   Start     Dose/Rate Route Frequency Ordered Stop   12/06/13 1200  piperacillin-tazobactam (ZOSYN) IVPB 3.375 g  Status:  Discontinued  3.375 g 100 mL/hr over 30 Minutes Intravenous 4 times per day 12/06/13 0924 12/06/13 0933   12/06/13 0945  piperacillin-tazobactam (ZOSYN) IVPB 3.375 g     3.375 g 12.5 mL/hr over 240 Minutes Intravenous 3 times per day 12/06/13 0935     12/05/13 1900  vancomycin (VANCOCIN) IVPB 1000 mg/200 mL premix     1,000 mg 200 mL/hr over 60 Minutes Intravenous  Once 12/05/13 1849 12/05/13 2038   12/05/13 1815  piperacillin-tazobactam (ZOSYN) IVPB 3.375 g     3.375 g 100 mL/hr over 30 Minutes Intravenous  Once 12/05/13 1808 12/05/13 1942       Assessment/Plan 1. POD 3 ercp/dx laparoscopy with drainage ruq abscess  2. Acute cholecystitis 3. Diffuse wheezing  Plan: 1. Will check  STAT films today of chest and abdomen.  Hey may require further imaging of his abdomen, will check films first.  His respiratory issues are new, ? Fluid overload vs aspiration PNA/PNA (he c/o all this reflux that seems to make it hard to breath)  Dr. Conley Canal is aware of this as well. 2. Cont abx therapy 3. Cont fulls. He's not taking much and is very hesitant to do much more with his diet.    LOS: 4 days    Joshlyn Beadle E 12/09/2013, 8:16 AM Pager: [phone]

## 2013-12-09 NOTE — Evaluation (Signed)
Physical Therapy Evaluation Patient Details Name: [name] [name] MRN: [id number] DOB: 11/05/1928 Today's Date: 12/09/2013   History of Present Illness  Patient is an 80 y/o male with PMH of 2 DM, CAD s/p CABG, HLD presenting with cholecystitis and sepsis.  Now s/p ERCP with sphincterotomy/papillotomy and ERCP with removal of calculus/calculi and drainage of right upper quadrant abcess.  Clinical Impression  Patient presents with decreased independence with mobility due to deficits listed below (PT problem list).  He will benefit from skilled PT in the acute setting to allow return home with family support following STSNF rehab stay.    Follow Up Recommendations SNF    Equipment Recommendations  None recommended by PT    Recommendations for Other Services       Precautions / Restrictions Precautions Precautions: Fall      Mobility  Bed Mobility Overal bed mobility: Needs Assistance Bed Mobility: Sit to Supine;Sit to Sidelying         Sit to sidelying: Mod assist General bed mobility comments: assist for legs into bed through sidelying; scooted up with supervision and cues, bed in trendelenberg with use of rails  Transfers Overall transfer level: Needs assistance Equipment used: Rolling walker (2 wheeled) Transfers: Sit to/from Stand Sit to Stand: Mod assist         General transfer comment: increased time to rise to standing  Ambulation/Gait Ambulation/Gait assistance: Min guard;Min assist Ambulation Distance (Feet): 80 Feet Assistive device: Rolling walker (2 wheeled) Gait Pattern/deviations: Step-through pattern;Decreased stride length;Shuffle;Trunk flexed     General Gait Details: cues for walker management  Stairs            Wheelchair Mobility    Modified Rankin (Stroke Patients Only)       Balance Overall balance assessment: Needs assistance         Standing balance support: Bilateral upper extremity supported Standing balance-Leahy  Scale: Poor Standing balance comment: UE support needed for balance standing                             Pertinent Vitals/Pain 7-8/10 in abdomen; RN aware; SpO2 94% amb on 2L O2 HR 98    Home Living Family/patient expects to be discharged to:: Skilled nursing facility Living Arrangements: Spouse/significant other                    Prior Function Level of Independence: Independent         Comments: until recent decline started using cane, then walker     Hand Dominance        Extremity/Trunk Assessment               Lower Extremity Assessment: Generalized weakness         Communication   Communication: No difficulties  Cognition Arousal/Alertness: Awake/alert Behavior During Therapy: WFL for tasks assessed/performed Overall Cognitive Status: Within Functional Limits for tasks assessed                      General Comments      Exercises        Assessment/Plan    PT Assessment Patient needs continued PT services  PT Diagnosis Abnormality of gait;Generalized weakness;Acute pain   PT Problem List Decreased strength;Decreased mobility;Decreased activity tolerance;Decreased balance;Pain;Decreased knowledge of use of DME  PT Treatment Interventions DME instruction;Therapeutic exercise;Gait training;Balance training;Functional mobility training;Therapeutic activities;Patient/family education   PT Goals (Current goals can be found  in the Care Plan section) Acute Rehab PT Goals Patient Stated Goal: To return to independent PT Goal Formulation: With patient/family Time For Goal Achievement: 12/23/13 Potential to Achieve Goals: Good    Frequency Min 3X/week   Barriers to discharge        Co-evaluation               End of Session Equipment Utilized During Treatment: Gait belt;Oxygen Activity Tolerance: Patient limited by fatigue Patient left: in bed;with call bell/phone within reach;with bed alarm set;with  family/visitor present Nurse Communication: Patient requests pain meds         Time: 2409-7353 PT Time Calculation (min): 22 min   Charges:   PT Evaluation $Initial PT Evaluation Tier I: 1 Procedure PT Treatments $Gait Training: 8-22 mins   PT G Codes:          Olivene Cookston,CYNDI 01/03/2014, 5:10 PM Magda Kiel, [place] January 03, 2014

## 2013-12-09 NOTE — Progress Notes (Signed)
Pt. Called me into room because dressing around JP drains had moderate amount of blood on them.  Pt. Had just been ambulating with PT and thinks the JP drains were "pulling" some.  Dressing changed and JP drains still intact.  Will continue to monitor. Syliva Overman

## 2013-12-09 NOTE — Progress Notes (Signed)
Still with some abdominal pain Respiratory status seems worse - CXR pending and TRH has been called. No appetite  WBC improving.  Continue with drain  Imogene Burn. Georgette Dover, MD, [facility]  General/ Trauma Surgery  12/09/2013 9:05 AM

## 2013-12-09 NOTE — Progress Notes (Signed)
JP dressings rechecked and are clean, dry, and intact.  Will continue to monitor. Syliva Overman

## 2013-12-10 LAB — GLUCOSE, CAPILLARY
GLUCOSE-CAPILLARY: 117 mg/dL — AB (ref 70–99)
GLUCOSE-CAPILLARY: 130 mg/dL — AB (ref 70–99)
Glucose-Capillary: 152 mg/dL — ABNORMAL HIGH (ref 70–99)
Glucose-Capillary: 133 mg/dL — ABNORMAL HIGH (ref 70–99)

## 2013-12-10 MED ORDER — METOCLOPRAMIDE HCL 5 MG/ML IJ SOLN
5.0000 mg | Freq: Three times a day (TID) | INTRAMUSCULAR | Status: AC
  Administered 2013-12-11 – 2013-12-12 (×6): 5 mg via INTRAVENOUS
  Filled 2013-12-10 (×5): qty 1

## 2013-12-10 MED ORDER — ENOXAPARIN SODIUM 40 MG/0.4ML ~~LOC~~ SOLN
40.0000 mg | SUBCUTANEOUS | Status: DC
  Administered 2013-12-11 – 2013-12-15 (×5): 40 mg via SUBCUTANEOUS
  Filled 2013-12-10 (×7): qty 0.4

## 2013-12-10 NOTE — Progress Notes (Signed)
Physical Therapy Treatment Patient Details Name: [name] [name] MRN: [id number] DOB: [birth year] Today's Date: 12/10/2013    History of Present Illness Patient is an 80 y/o male with PMH of 2 DM, CAD s/p CABG, HLD presenting with cholecystitis and sepsis.  Now s/p ERCP with sphincterotomy/papillotomy and ERCP with removal of calculus/calculi and drainage of right upper quadrant abcess.    PT Comments    Patient demonstrates some improvements in mobility today, reports decreased pain today but increased fatigue. Ambulated on room air sP02 >94% on room air with activity, 98% at rest.  Follow Up Recommendations  SNF     Equipment Recommendations  None recommended by PT    Recommendations for Other Services       Precautions / Restrictions Precautions Precautions: Fall Restrictions Weight Bearing Restrictions: No    Mobility  Bed Mobility Overal bed mobility: Needs Assistance Bed Mobility: Sit to Supine;Sit to Sidelying         Sit to sidelying: Mod assist General bed mobility comments: Assist to elevate LEs into bed and reposition in bed, patient able to use rails to slide up in bed  Transfers Overall transfer level: Needs assistance Equipment used: Rolling walker (2 wheeled) Transfers: Sit to/from Stand Sit to Stand: Min assist         General transfer comment: assist for stability upon standing  Ambulation/Gait Ambulation/Gait assistance: Min guard Ambulation Distance (Feet): 260 Feet (one standing rest break then addtl 140) Assistive device: Rolling walker (2 wheeled) Gait Pattern/deviations: Step-through pattern;Decreased stride length;Shuffle;Trunk flexed Gait velocity: decreased   General Gait Details: VCs for upright posture and safety.    Stairs            Wheelchair Mobility    Modified Rankin (Stroke Patients Only)       Balance             Standing balance-Leahy Scale: Poor                      Cognition  Arousal/Alertness: Awake/alert Behavior During Therapy: WFL for tasks assessed/performed Overall Cognitive Status: Within Functional Limits for tasks assessed                      Exercises      General Comments        Pertinent Vitals/Pain 5/10    Home Living                      Prior Function            PT Goals (current goals can now be found in the care plan section) Acute Rehab PT Goals Patient Stated Goal: To return to independent PT Goal Formulation: With patient/family Time For Goal Achievement: 12/23/13 Potential to Achieve Goals: Good Progress towards PT goals: Progressing toward goals    Frequency  Min 3X/week    PT Plan Current plan remains appropriate    Co-evaluation             End of Session Equipment Utilized During Treatment: Gait belt;Oxygen Activity Tolerance: Patient limited by fatigue Patient left: in bed;with call bell/phone within reach;with bed alarm set;with family/visitor present     Time: 3614-4315 PT Time Calculation (min): 23 min  Charges:  $Gait Training: 8-22 mins $Therapeutic Activity: 8-22 mins                    G Codes:  Duncan Dull 12/10/2013, 1:03 PM Alben Deeds, [place] DPT  [phone]

## 2013-12-10 NOTE — Progress Notes (Signed)
Patient ID: [name] [name], male   DOB: [birth year], 80 y.o.   MRN: [id number] 4 Days Post-Op  Subjective: Gradually feels a little better. Some right upper quadrant pain with deep breaths but slowly improving. Tolerating full liquids but fills up quickly. Positive flatus, no bowel movements. Up in chair this morning.  Objective: Vital signs in last 24 hours: Temp:  [97.4 F (36.3 C)-98.3 F (36.8 C)] 98.3 F (36.8 C) (04/03 0558) Pulse Rate:  [63-90] 65 (04/03 0558) Resp:  [18-21] 21 (04/03 0558) BP: (141-169)/(66-70) 169/70 mmHg (04/03 0558) SpO2:  [95 %-99 %] 99 % (04/03 0558) FiO2 (%):  [28 %] 28 % (04/02 1340) Weight:  [216 lb 0.8 oz (98 kg)] 216 lb 0.8 oz (98 kg) (04/03 0558) Last BM Date: 12/07/13  Intake/Output from previous day: 04/02 0701 - 04/03 0700 In: 1620 [P.O.:1320; I.V.:100; IV Piggyback:200] Out: 1025 [Urine:925; Drains:100] Intake/Output this shift: Total I/O In: -  Out: 800 [Urine:800]  General appearance: alert, cooperative and no distress Resp: no wheezing or increased work of breathing GI: mild right upper quadrant tenderness without guarding. JP drains in place with serosanguineous drainage. Incision/Wound: no drainage or erythema  Lab Results:   Recent Labs  12/08/13 0245 12/09/13 0554  WBC 15.0* 12.9*  HGB 8.8* 9.7*  HCT 25.7* 28.7*  PLT 235 274   BMET  Recent Labs  12/08/13 0245 12/09/13 0554  NA 138 140  K 4.2 4.5  CL 106 109  CO2 17* 16*  GLUCOSE 89 115*  BUN 41* 41*  CREATININE 1.62* 1.33  CALCIUM 7.8* 8.3*     Studies/Results: Dg Abd Acute W/chest  12/09/2013   CLINICAL DATA:  Abdominal distension and wheezing.  EXAM: ACUTE ABDOMEN SERIES (ABDOMEN 2 VIEW & CHEST 1 VIEW)  COMPARISON:  DG ABD PORTABLE 1V dated 12/08/2013; CT ABD/PELV WO CM dated 12/05/2013  FINDINGS: Low lung volumes. Cardiac silhouette is enlarged. Areas of linear density project within the left lung base.  Residual contrast is appreciated within the colon.  Air is seen within nondilated loops of small bowel. Surgical drain is appreciated within the right upper quadrant. Fusion of the lower lumbar spine is demonstrated. Osseous structures otherwise unremarkable.  IMPRESSION: Likely atelectasis left lung base and infiltrate cannot be excluded.  Nonobstructive bowel gas pattern with a residua of radiopaque contrast. Surgical drain right upper quadrant.   Electronically Signed   By: Margaree Mackintosh M.D.   On: 12/09/2013 09:41   Dg Abd Portable 1v  12/08/2013   CLINICAL DATA:  80 year old male status post procedure with possible ileus. Initial encounter.  EXAM: PORTABLE ABDOMEN - 1 VIEW  COMPARISON:  ERCP images 330 15.  CT Abdomen and Pelvis 12/05/2013.  FINDINGS: Portable spine view of the abdomen at 1100 hrs. Contrast in nondilated colon. Two catheters project over the right abdomen, possibly postoperative drains. Mild gaseous distension of the body of the stomach. Paucity of small bowel gas. No other increased or dilated loops identified. Stable visualized osseous structures.  IMPRESSION: Non obstructed bowel gas pattern with stable mild gaseous distension of the stomach.   Electronically Signed   By: Lars Pinks M.D.   On: 12/08/2013 12:19    Anti-infectives: Anti-infectives   Start     Dose/Rate Route Frequency Ordered Stop   12/06/13 1200  piperacillin-tazobactam (ZOSYN) IVPB 3.375 g  Status:  Discontinued     3.375 g 100 mL/hr over 30 Minutes Intravenous 4 times per day 12/06/13 0924 12/06/13 0933   12/06/13 0945  piperacillin-tazobactam (ZOSYN) IVPB 3.375 g     3.375 g 12.5 mL/hr over 240 Minutes Intravenous 3 times per day 12/06/13 0935     12/05/13 1900  vancomycin (VANCOCIN) IVPB 1000 mg/200 mL premix     1,000 mg 200 mL/hr over 60 Minutes Intravenous  Once 12/05/13 1849 12/05/13 2038   12/05/13 1815  piperacillin-tazobactam (ZOSYN) IVPB 3.375 g     3.375 g 100 mL/hr over 30 Minutes Intravenous  Once 12/05/13 1808 12/05/13 1942       Assessment/Plan: s/p Procedure(s): ENDOSCOPIC RETROGRADE CHOLANGIOPANCREATOGRAPHY (ERCP) Diagnostic Laparoscopy for  drainage Intrabdominal abscess Apparent perforated gallbladder with abscess. Stable/gradually improving. Continue IV antibiotics. Continue full liquid diet for now. Mobilizing physical therapy. has significant medical issues, internal medicine managing.    LOS: 5 days    Finnlee Guarnieri T 12/10/2013

## 2013-12-10 NOTE — Progress Notes (Addendum)
Progress Note  [name] [name] QPY:[id number] DOB: 03-16-1929 DOA: 12/05/2013 PCP: Elsie Stain, MD  Time spent : 88mins  Brief narrative: 80 year old male patient with multiple medical problems including coronary artery disease and diabetes. Presented to the hospital with complaints of significant right upper quadrant pain for 2 weeks. This was associated with nausea and weakness. He apparently had been seen by his primary care provider 2 weeks prior for similar symptoms and was treated for musculoskeletal strain. No apparent fevers or chills at home. Since that time he has had progression in his symptoms and increased anorexia. He also now has right shoulder pain. Since that initial visit he has developed bilious vomiting and has fallen at least twice at home.  Upon evaluation in the ER he was found to have a white count of 42,000. CT of the abdomen and pelvis revealed an enlarged gallbladder with calcified gallstones and stranding concerning for acute cholecystitis. There is possibly a tiny calcified stone in the proximal bile duct. He also was noted to have acute renal failure w/ creatinine of 2.35 noting his baseline creatinine 1.2 back in February. He had mild transaminitis with an elevated alkaline phosphatase and only mildly elevated total bilirubin. General Surgery was contacted by the ER physician. Given his cardiac history preoperative clearance was recommended. In addition to the above findings patient had very soft blood pressures with systolic in the 12W despite IV fluids.  HPI/Subjective: Nausea slightly better today on reglan, scheduled, but not eating much. Wheeze, dyspnea better.   Assessment/Plan:  Acute cholecystitis/right upper quadrant abscess/Choledocholithiasis -s/p attempted lap chole - due to perforated GB underwent diag laparoscopy with drainage of RUQ abscess -follow Blake drains x 2- plan is eventual cholecystectomy  -Continue empiric antibiotics - WBCs cont to  trend down ERCP with stone retrieval + sphincterotomy by Dr. Ardis Hughs  Sepsis Resolved  Wheezing/dyspnea/possible HCAP: wheeze better. Change HHN to PRN.   Nausea: No vomiting. Slightly improved on scheduled reglan  CAD  Acute renal failure on CKD (chronic kidney disease), stage III Resolving  HYPOTHYROIDISM, BORDERLINE -Was not on medications prior to admission  DIABETES MELLITUS, TYPE II Controled  HYPERTENSION Blood pressure increasing. Will resume ACE inhibitor at lower dose now that creatinine normalizing.  DVT prophylaxis: SCd Code Status: Full Family Communication: Family at bedside Disposition Plan/Expected LOS: SNF when stable  Consultants: Gen. Surgery Gastroenterology Cardiology IR  Procedures: ERCP   Exploratory Laparoscopy w/ drainage placement x2  Antibiotics: Zosyn 3/29 >>> Vancomycin 3/29    Objective: Blood pressure 150/76, pulse 69, temperature 98.5 F (36.9 C), temperature source Oral, resp. rate 20, height 5\' 11"  (1.803 m), weight 98 kg (216 lb 0.8 oz), SpO2 95.00%.  Intake/Output Summary (Last 24 hours) at 12/10/13 2209 Last data filed at 12/10/13 1955  Gross per 24 hour  Intake    760 ml  Output   1865 ml  Net  -1105 ml   Exam: General: Appears comfortable Lungs: Good air movement. CTA without WRR Cardiovascular: Regular rate and rhythm without murmur gallop or rub normal S1 and S2,  Abdomen: Distended. Soft. Drains in place. Musculoskeletal: No significant cyanosis, clubbing of bilateral lower extremities  Scheduled Meds:  Scheduled Meds: . albuterol  2.5 mg Nebulization QID  . carvedilol  20 mg Oral Daily  . insulin aspart  0-5 Units Subcutaneous QHS  . insulin aspart  0-9 Units Subcutaneous TID WC  . lisinopril  10 mg Oral Daily  . metoCLOPramide (REGLAN) injection  5 mg Intravenous TID AC  .  piperacillin-tazobactam (ZOSYN)  IV  3.375 g Intravenous 3 times per day   Data Reviewed: Basic Metabolic Panel:  Recent  Labs Lab 12/05/13 1452 12/06/13 0315 12/07/13 1130 12/08/13 0245 12/09/13 0554  NA 134* 136* 138 138 140  K 4.7 5.1 4.8 4.2 4.5  CL 93* 102 106 106 109  CO2 19 17* 18* 17* 16*  GLUCOSE 232* 85 153* 89 115*  BUN 22 28* 40* 41* 41*  CREATININE 2.35* 2.11* 1.85* 1.62* 1.33  CALCIUM 9.5 8.2* 8.0* 7.8* 8.3*   Liver Function Tests:  Recent Labs Lab 12/05/13 1452 12/06/13 0315 12/07/13 1130 12/08/13 0245 12/09/13 0554  AST 54* 47* 31 24 20   ALT 64* 54* 36 28 22  ALKPHOS 270* 227* 130* 160* 124*  BILITOT 1.5* 1.5* 1.5* 1.4* 1.2  PROT 7.2 6.1 5.9* 5.5* 5.8*  ALBUMIN 2.3* 2.0* 1.9* 1.7* 1.6*    Recent Labs Lab 12/05/13 1452 12/06/13 0315  LIPASE 22 15   CBC:  Recent Labs Lab 12/05/13 1452 12/06/13 0315 12/07/13 0328 12/08/13 0245 12/09/13 0554  WBC 42.8* 23.3* 18.8* 15.0* 12.9*  NEUTROABS 40.2* 22.8* 17.2*  --   --   HGB 13.1 10.6* 9.5* 8.8* 9.7*  HCT 36.8* 30.1* 27.9* 25.7* 28.7*  MCV 96.3 94.4 96.2 95.5 96.3  PLT 327 271 259 235 274   CBG:  Recent Labs Lab 12/09/13 1716 12/09/13 2058 12/10/13 0727 12/10/13 1209 12/10/13 1704  GLUCAP 130* 124* 117* 152* 130*    Recent Results (from the past 240 hour(s))  MRSA PCR SCREENING     Status: None   Collection Time    12/05/13  8:30 PM      Result Value Ref Range Status   MRSA by PCR NEGATIVE  NEGATIVE Final   Comment:            The GeneXpert MRSA Assay (FDA     approved for NASAL specimens     only), is one component of a     comprehensive MRSA colonization     surveillance program. It is not     intended to diagnose MRSA     infection nor to guide or     monitor treatment for     MRSA infections.  CULTURE, BLOOD (ROUTINE X 2)     Status: None   Collection Time    12/05/13  9:00 PM      Result Value Ref Range Status   Specimen Description BLOOD LEFT ARM   Final   Special Requests     Final   Value: BOTTLES DRAWN AEROBIC AND ANAEROBIC 10CC AEROBIC 8CC ANAEROBIC   Culture  Setup Time     Final    Value: 12/06/2013 01:37     Performed at [facility]   Culture     Final   Value:        BLOOD CULTURE RECEIVED NO GROWTH TO DATE CULTURE WILL BE HELD FOR 5 DAYS BEFORE ISSUING A FINAL NEGATIVE REPORT     Performed at [facility]   Report Status PENDING   Incomplete  CULTURE, BLOOD (ROUTINE X 2)     Status: None   Collection Time    12/05/13  9:30 PM      Result Value Ref Range Status   Specimen Description BLOOD LEFT HAND   Final   Special Requests BOTTLES DRAWN AEROBIC ONLY 5CC   Final   Culture  Setup Time     Final   Value: 12/06/2013  01:36     Performed at [facility]     Final   Value:        BLOOD CULTURE RECEIVED NO GROWTH TO DATE CULTURE WILL BE HELD FOR 5 DAYS BEFORE ISSUING A FINAL NEGATIVE REPORT     Performed at [facility]   Report Status PENDING   Incomplete  URINE CULTURE     Status: None   Collection Time    12/06/13  2:24 AM      Result Value Ref Range Status   Specimen Description URINE, CLEAN CATCH   Final   Special Requests NONE   Final   Culture  Setup Time     Final   Value: 12/06/2013 09:13     Performed at [facility]     Final   Value: 1,000 COLONIES/ML     Performed at [facility]   Culture     Final   Value: INSIGNIFICANT GROWTH     Performed at [facility]   Report Status 12/07/2013 FINAL   Final     Studies:  CLINICAL DATA: Abdominal distension and wheezing.  EXAM:  ACUTE ABDOMEN SERIES (ABDOMEN 2 VIEW & CHEST 1 VIEW)  COMPARISON: DG ABD PORTABLE 1V dated 12/08/2013; CT ABD/PELV WO CM  dated 12/05/2013  FINDINGS:  Low lung volumes. Cardiac silhouette is enlarged. Areas of linear  density project within the left lung base.  Residual contrast is appreciated within the colon. Air is seen  within nondilated loops of small bowel. Surgical drain is  appreciated within the right upper quadrant. Fusion of the lower  lumbar spine is demonstrated. Osseous  structures otherwise  unremarkable.  IMPRESSION:  Likely atelectasis left lung base and infiltrate cannot be excluded.  Nonobstructive bowel gas pattern with a residua of radiopaque  contrast. Surgical drain right upper quadrant.   Doree Barthel, M.D. [facility] [phone]  If 7PM-7AM, please contact night-coverage [email or web address] Password [email or web address] 12/10/2013, 10:09 PM   LOS: 5 days

## 2013-12-11 LAB — CBC
HCT: 26.3 % — ABNORMAL LOW (ref 39.0–52.0)
HEMOGLOBIN: 9.2 g/dL — AB (ref 13.0–17.0)
MCH: 33.1 pg (ref 26.0–34.0)
MCHC: 35 g/dL (ref 30.0–36.0)
MCV: 94.6 fL (ref 78.0–100.0)
Platelets: 267 10*3/uL (ref 150–400)
RBC: 2.78 MIL/uL — AB (ref 4.22–5.81)
RDW: 14.5 % (ref 11.5–15.5)
WBC: 9.4 10*3/uL (ref 4.0–10.5)

## 2013-12-11 LAB — COMPREHENSIVE METABOLIC PANEL
ALK PHOS: 127 U/L — AB (ref 39–117)
ALT: 15 U/L (ref 0–53)
AST: 16 U/L (ref 0–37)
Albumin: 1.5 g/dL — ABNORMAL LOW (ref 3.5–5.2)
BILIRUBIN TOTAL: 0.9 mg/dL (ref 0.3–1.2)
BUN: 26 mg/dL — ABNORMAL HIGH (ref 6–23)
CHLORIDE: 108 meq/L (ref 96–112)
CO2: 19 meq/L (ref 19–32)
Calcium: 8.3 mg/dL — ABNORMAL LOW (ref 8.4–10.5)
Creatinine, Ser: 1.03 mg/dL (ref 0.50–1.35)
GFR calc Af Amer: 76 mL/min — ABNORMAL LOW (ref >90)
GFR, EST NON AFRICAN AMERICAN: 66 mL/min — AB (ref >90)
Glucose, Bld: 168 mg/dL — ABNORMAL HIGH (ref 70–99)
POTASSIUM: 3.7 meq/L (ref 3.7–5.3)
SODIUM: 141 meq/L (ref 137–147)
Total Protein: 5.8 g/dL — ABNORMAL LOW (ref 6.0–8.3)

## 2013-12-11 LAB — GLUCOSE, CAPILLARY
GLUCOSE-CAPILLARY: 124 mg/dL — AB (ref 70–99)
Glucose-Capillary: 122 mg/dL — ABNORMAL HIGH (ref 70–99)
Glucose-Capillary: 142 mg/dL — ABNORMAL HIGH (ref 70–99)
Glucose-Capillary: 126 mg/dL — ABNORMAL HIGH (ref 70–99)

## 2013-12-11 MED ORDER — ALBUTEROL SULFATE (2.5 MG/3ML) 0.083% IN NEBU
2.5000 mg | INHALATION_SOLUTION | Freq: Two times a day (BID) | RESPIRATORY_TRACT | Status: DC
  Administered 2013-12-11 – 2013-12-13 (×3): 2.5 mg via RESPIRATORY_TRACT
  Filled 2013-12-11 (×4): qty 3

## 2013-12-11 NOTE — Progress Notes (Signed)
Progress Note  LENG MONTESDEOCA MOQ:[id number] DOB: 1929/04/20 DOA: 12/05/2013 PCP: Elsie Stain, MD  Time spent : 72mins  Brief narrative: 80 year old male patient with multiple medical problems including coronary artery disease and diabetes. Presented to the hospital with complaints of significant right upper quadrant pain for 2 weeks. This was associated with nausea and weakness. He apparently had been seen by his primary care provider 2 weeks prior for similar symptoms and was treated for musculoskeletal strain. No apparent fevers or chills at home. Since that time he has had progression in his symptoms and increased anorexia. He also now has right shoulder pain. Since that initial visit he has developed bilious vomiting and has fallen at least twice at home.  Upon evaluation in the ER he was found to have a white count of 42,000. CT of the abdomen and pelvis revealed an enlarged gallbladder with calcified gallstones and stranding concerning for acute cholecystitis. There is possibly a tiny calcified stone in the proximal bile duct. He also was noted to have acute renal failure w/ creatinine of 2.35 noting his baseline creatinine 1.2 back in February. He had mild transaminitis with an elevated alkaline phosphatase and only mildly elevated total bilirubin. General Surgery was contacted by the ER physician. Given his cardiac history preoperative clearance was recommended. In addition to the above findings patient had very soft blood pressures with systolic in the 35W despite IV fluids.  HPI/Subjective: Feeling better No c/o   Assessment/Plan:  Acute cholecystitis/right upper quadrant abscess/Choledocholithiasis -s/p attempted lap chole - due to perforated GB underwent diag laparoscopy with drainage of RUQ abscess -follow Blake drains x 2- plan is eventual cholecystectomy  -Continue empiric antibiotics - WBCs cont to trend down ERCP with stone retrieval + sphincterotomy by Dr.  Ardis Hughs  Sepsis Resolved  Wheezing/dyspnea/possible HCAP: wheeze better. Change HHN to PRN.   Nausea: No vomiting. Slightly improved on scheduled reglan  CAD  Acute renal failure on CKD (chronic kidney disease), stage III Resolving  HYPOTHYROIDISM, BORDERLINE -Was not on medications prior to admission  DIABETES MELLITUS, TYPE II Controled  HYPERTENSION Blood pressure increasing. Will resume ACE inhibitor at lower dose now that creatinine normalizing.  DVT prophylaxis: SCd Code Status: Full Family Communication: patient Disposition Plan/Expected LOS: SNF when stable  Consultants: Gen. Surgery Gastroenterology Cardiology IR  Procedures: ERCP   Exploratory Laparoscopy w/ drainage placement x2  Antibiotics: Zosyn 3/29 >>> Vancomycin 3/29    Objective: Blood pressure 162/69, pulse 65, temperature 98 F (36.7 C), temperature source Oral, resp. rate 20, height 5\' 11"  (1.803 m), weight 95.3 kg (210 lb 1.6 oz), SpO2 91.00%.  Intake/Output Summary (Last 24 hours) at 12/11/13 1115 Last data filed at 12/11/13 1105  Gross per 24 hour  Intake 1289.67 ml  Output   2390 ml  Net -1100.33 ml   Exam: General: Appears comfortable Lungs: Good air movement. CTA without WRR Cardiovascular: Regular rate and rhythm without murmur gallop or rub normal S1 and S2,  Abdomen: Distended. Soft. Drains in place. Musculoskeletal: No significant cyanosis, clubbing of bilateral lower extremities  Scheduled Meds:  Scheduled Meds: . albuterol  2.5 mg Nebulization BID  . carvedilol  20 mg Oral Daily  . enoxaparin (LOVENOX) injection  40 mg Subcutaneous Q24H  . insulin aspart  0-5 Units Subcutaneous QHS  . insulin aspart  0-9 Units Subcutaneous TID WC  . lisinopril  10 mg Oral Daily  . metoCLOPramide (REGLAN) injection  5 mg Intravenous TID AC  . piperacillin-tazobactam (ZOSYN)  IV  3.375 g Intravenous 3 times per day   Data Reviewed: Basic Metabolic Panel:  Recent Labs Lab  12/05/13 1452 12/06/13 0315 12/07/13 1130 12/08/13 0245 12/09/13 0554  NA 134* 136* 138 138 140  K 4.7 5.1 4.8 4.2 4.5  CL 93* 102 106 106 109  CO2 19 17* 18* 17* 16*  GLUCOSE 232* 85 153* 89 115*  BUN 22 28* 40* 41* 41*  CREATININE 2.35* 2.11* 1.85* 1.62* 1.33  CALCIUM 9.5 8.2* 8.0* 7.8* 8.3*   Liver Function Tests:  Recent Labs Lab 12/05/13 1452 12/06/13 0315 12/07/13 1130 12/08/13 0245 12/09/13 0554  AST 54* 47* 31 24 20   ALT 64* 54* 36 28 22  ALKPHOS 270* 227* 130* 160* 124*  BILITOT 1.5* 1.5* 1.5* 1.4* 1.2  PROT 7.2 6.1 5.9* 5.5* 5.8*  ALBUMIN 2.3* 2.0* 1.9* 1.7* 1.6*    Recent Labs Lab 12/05/13 1452 12/06/13 0315  LIPASE 22 15   CBC:  Recent Labs Lab 12/05/13 1452 12/06/13 0315 12/07/13 0328 12/08/13 0245 12/09/13 0554 12/11/13 1016  WBC 42.8* 23.3* 18.8* 15.0* 12.9* 9.4  NEUTROABS 40.2* 22.8* 17.2*  --   --   --   HGB 13.1 10.6* 9.5* 8.8* 9.7* 9.2*  HCT 36.8* 30.1* 27.9* 25.7* 28.7* 26.3*  MCV 96.3 94.4 96.2 95.5 96.3 94.6  PLT 327 271 259 235 274 267   CBG:  Recent Labs Lab 12/10/13 0727 12/10/13 1209 12/10/13 1704 12/10/13 2208 12/11/13 0753  GLUCAP 117* 152* 130* 133* 122*    Recent Results (from the past 240 hour(s))  MRSA PCR SCREENING     Status: None   Collection Time    12/05/13  8:30 PM      Result Value Ref Range Status   MRSA by PCR NEGATIVE  NEGATIVE Final   Comment:            The GeneXpert MRSA Assay (FDA     approved for NASAL specimens     only), is one component of a     comprehensive MRSA colonization     surveillance program. It is not     intended to diagnose MRSA     infection nor to guide or     monitor treatment for     MRSA infections.  CULTURE, BLOOD (ROUTINE X 2)     Status: None   Collection Time    12/05/13  9:00 PM      Result Value Ref Range Status   Specimen Description BLOOD LEFT ARM   Final   Special Requests     Final   Value: BOTTLES DRAWN AEROBIC AND ANAEROBIC 10CC AEROBIC 8CC ANAEROBIC    Culture  Setup Time     Final   Value: 12/06/2013 01:37     Performed at [facility]   Culture     Final   Value:        BLOOD CULTURE RECEIVED NO GROWTH TO DATE CULTURE WILL BE HELD FOR 5 DAYS BEFORE ISSUING A FINAL NEGATIVE REPORT     Performed at [facility]   Report Status PENDING   Incomplete  CULTURE, BLOOD (ROUTINE X 2)     Status: None   Collection Time    12/05/13  9:30 PM      Result Value Ref Range Status   Specimen Description BLOOD LEFT HAND   Final   Special Requests BOTTLES DRAWN AEROBIC ONLY 5CC   Final   Culture  Setup Time  Final   Value: 12/06/2013 01:36     Performed at [facility]   Culture     Final   Value:        BLOOD CULTURE RECEIVED NO GROWTH TO DATE CULTURE WILL BE HELD FOR 5 DAYS BEFORE ISSUING A FINAL NEGATIVE REPORT     Performed at [facility]   Report Status PENDING   Incomplete  URINE CULTURE     Status: None   Collection Time    12/06/13  2:24 AM      Result Value Ref Range Status   Specimen Description URINE, CLEAN CATCH   Final   Special Requests NONE   Final   Culture  Setup Time     Final   Value: 12/06/2013 09:13     Performed at [facility]     Final   Value: 1,000 COLONIES/ML     Performed at [facility]   Culture     Final   Value: INSIGNIFICANT GROWTH     Performed at [facility]   Report Status 12/07/2013 FINAL   Final     Studies:  CLINICAL DATA: Abdominal distension and wheezing.  EXAM:  ACUTE ABDOMEN SERIES (ABDOMEN 2 VIEW & CHEST 1 VIEW)  COMPARISON: DG ABD PORTABLE 1V dated 12/08/2013; CT ABD/PELV WO CM  dated 12/05/2013  FINDINGS:  Low lung volumes. Cardiac silhouette is enlarged. Areas of linear  density project within the left lung base.  Residual contrast is appreciated within the colon. Air is seen  within nondilated loops of small bowel. Surgical drain is  appreciated within the right upper quadrant. Fusion of the lower  lumbar  spine is demonstrated. Osseous structures otherwise  unremarkable.  IMPRESSION:  Likely atelectasis left lung base and infiltrate cannot be excluded.  Nonobstructive bowel gas pattern with a residua of radiopaque  contrast. Surgical drain right upper quadrant.   Eulogio Bear [facility] [phone]  If 7PM-7AM, please contact night-coverage [email or web address] Password [email or web address] 12/11/2013, 11:15 AM   LOS: 6 days

## 2013-12-11 NOTE — Progress Notes (Signed)
Patient ID: [name] [name], male   DOB: 27-Nov-1928, 80 y.o.   MRN: [id number] 5 Days Post-Op  Subjective: Abdominal pain is a little better. He is having some shoulder pain and hiccups. Drinking some some clear liquids but having a little nausea and heartburn with these. Has been up out of bed.  Objective: Vital signs in last 24 hours: Temp:  [98 F (36.7 C)-98.6 F (37 C)] 98 F (36.7 C) (04/04 0648) Pulse Rate:  [65-69] 65 (04/04 0648) Resp:  [20] 20 (04/04 0648) BP: (124-150)/(60-76) 148/67 mmHg (04/04 0648) SpO2:  [91 %-96 %] 91 % (04/04 0750) FiO2 (%):  [21 %] 21 % (04/03 1601) Weight:  [210 lb 1.6 oz (95.3 kg)] 210 lb 1.6 oz (95.3 kg) (04/04 0648) Last BM Date: 12/05/13  Intake/Output from previous day: 04/03 0701 - 04/04 0700 In: 1629.7 [P.O.:1238; I.V.:191.7; IV Piggyback:200] Out: 2890 [Urine:2800; Drains:90] Intake/Output this shift:    General appearance: alert, cooperative and no distress Resp: nno wheezing or increased work of breathing GI: mild distention but soft and essentially nontender. Drains in place with more medial drain showing some bile tinged drainage and the other sero-sanguinous Incision/Wound: no erythema or drainage  Lab Results:   Recent Labs  12/09/13 0554  WBC 12.9*  HGB 9.7*  HCT 28.7*  PLT 274   BMET  Recent Labs  12/09/13 0554  NA 140  K 4.5  CL 109  CO2 16*  GLUCOSE 115*  BUN 41*  CREATININE 1.33  CALCIUM 8.3*     Studies/Results: Dg Abd Acute W/chest  12/09/2013   CLINICAL DATA:  Abdominal distension and wheezing.  EXAM: ACUTE ABDOMEN SERIES (ABDOMEN 2 VIEW & CHEST 1 VIEW)  COMPARISON:  DG ABD PORTABLE 1V dated 12/08/2013; CT ABD/PELV WO CM dated 12/05/2013  FINDINGS: Low lung volumes. Cardiac silhouette is enlarged. Areas of linear density project within the left lung base.  Residual contrast is appreciated within the colon. Air is seen within nondilated loops of small bowel. Surgical drain is appreciated within the right  upper quadrant. Fusion of the lower lumbar spine is demonstrated. Osseous structures otherwise unremarkable.  IMPRESSION: Likely atelectasis left lung base and infiltrate cannot be excluded.  Nonobstructive bowel gas pattern with a residua of radiopaque contrast. Surgical drain right upper quadrant.   Electronically Signed   By: Margaree Mackintosh M.D.   On: 12/09/2013 09:41    Anti-infectives: Anti-infectives   Start     Dose/Rate Route Frequency Ordered Stop   12/06/13 1200  piperacillin-tazobactam (ZOSYN) IVPB 3.375 g  Status:  Discontinued     3.375 g 100 mL/hr over 30 Minutes Intravenous 4 times per day 12/06/13 0924 12/06/13 0933   12/06/13 0945  piperacillin-tazobactam (ZOSYN) IVPB 3.375 g     3.375 g 12.5 mL/hr over 240 Minutes Intravenous 3 times per day 12/06/13 0935     12/05/13 1900  vancomycin (VANCOCIN) IVPB 1000 mg/200 mL premix     1,000 mg 200 mL/hr over 60 Minutes Intravenous  Once 12/05/13 1849 12/05/13 2038   12/05/13 1815  piperacillin-tazobactam (ZOSYN) IVPB 3.375 g     3.375 g 100 mL/hr over 30 Minutes Intravenous  Once 12/05/13 1808 12/05/13 1942      Assessment/Plan: s/p Procedure(s): ENDOSCOPIC RETROGRADE CHOLANGIOPANCREATOGRAPHY (ERCP) Diagnostic Laparoscopy for  drainage Intrabdominal abcess Appears to be gradually improving. Today's lab work pending. His abdomen is benign. Continue IV antibiotics Having some heartburn and nausea with liquids, probably mild ileus. Continue clear liquid diet for today.  LOS: 6 days    Syncere Kaminski T 12/11/2013

## 2013-12-12 LAB — GLUCOSE, CAPILLARY
GLUCOSE-CAPILLARY: 118 mg/dL — AB (ref 70–99)
Glucose-Capillary: 146 mg/dL — ABNORMAL HIGH (ref 70–99)
Glucose-Capillary: 127 mg/dL — ABNORMAL HIGH (ref 70–99)
Glucose-Capillary: 122 mg/dL — ABNORMAL HIGH (ref 70–99)

## 2013-12-12 LAB — CULTURE, BLOOD (ROUTINE X 2)
Culture: NO GROWTH
Culture: NO GROWTH

## 2013-12-12 MED ORDER — POLYETHYLENE GLYCOL 3350 17 GM/SCOOP PO POWD
1.0000 | Freq: Once | ORAL | Status: AC
  Administered 2013-12-12: 1 via ORAL
  Filled 2013-12-12: qty 255

## 2013-12-12 NOTE — Progress Notes (Signed)
Patient ID: [name] [name], male   DOB: 1929/02/21, 80 y.o.   MRN: [id number] 6 Days Post-Op  Subjective: Still with epigastric and right upper quadrant pain which is about the same as yesterday but definitely improved since admission. He is taking some full liquids but having nausea without vomiting. Has flatus but no bowel movements.  Objective: Vital signs in last 24 hours: Temp:  [98 F (36.7 C)-98.5 F (36.9 C)] 98.5 F (36.9 C) (04/05 0519) Pulse Rate:  [66-68] 68 (04/05 0519) Resp:  [18-20] 20 (04/05 0519) BP: (146-170)/(64-78) 154/64 mmHg (04/05 0519) SpO2:  [97 %-100 %] 98 % (04/05 0519) Weight:  [203 lb 7.8 oz (92.3 kg)] 203 lb 7.8 oz (92.3 kg) (04/05 0519) Last BM Date: 12/05/13  Intake/Output from previous day: 04/04 0701 - 04/05 0700 In: 600 [P.O.:480; I.V.:120] Out: 1940 [Urine:1825; Drains:115] Intake/Output this shift:    General appearance: alert, cooperative and no distress GI: mild epigastric and right upper quadrant tenderness. Mild distention but soft. A few bowel sounds. JP drains with serosanguineous drainage and laterally and bile tinged clear drainage medially. Incision/Wound: no erythema or drainage  Lab Results:   Recent Labs  12/11/13 1016  WBC 9.4  HGB 9.2*  HCT 26.3*  PLT 267   BMET  Recent Labs  12/11/13 1016  NA 141  K 3.7  CL 108  CO2 19  GLUCOSE 168*  BUN 26*  CREATININE 1.03  CALCIUM 8.3*     Studies/Results: No results found.  Anti-infectives: Anti-infectives   Start     Dose/Rate Route Frequency Ordered Stop   12/06/13 1200  piperacillin-tazobactam (ZOSYN) IVPB 3.375 g  Status:  Discontinued     3.375 g 100 mL/hr over 30 Minutes Intravenous 4 times per day 12/06/13 0924 12/06/13 0933   12/06/13 0945  piperacillin-tazobactam (ZOSYN) IVPB 3.375 g     3.375 g 12.5 mL/hr over 240 Minutes Intravenous 3 times per day 12/06/13 0935     12/05/13 1900  vancomycin (VANCOCIN) IVPB 1000 mg/200 mL premix     1,000 mg 200  mL/hr over 60 Minutes Intravenous  Once 12/05/13 1849 12/05/13 2038   12/05/13 1815  piperacillin-tazobactam (ZOSYN) IVPB 3.375 g     3.375 g 100 mL/hr over 30 Minutes Intravenous  Once 12/05/13 1808 12/05/13 1942      Assessment/Plan: s/p Procedure(s): ENDOSCOPIC RETROGRADE CHOLANGIOPANCREATOGRAPHY (ERCP) Diagnostic Laparoscopy for  drainage Intrabdominal abcess Stable, white blood count normalized but still with some discomfort and probably some degree of ileus. Continue current treatment. I think he will need repeat CT scan in a couple of days to assess for resolution and see if any further drainage might be needed. We'll try one dose of MiraLAX   LOS: 7 days    Avonell Lenig T 12/12/2013

## 2013-12-12 NOTE — Progress Notes (Signed)
Progress Note  [name] [name] EXB:[id number] DOB: 04-Nov-1928 DOA: 12/05/2013 PCP: Elsie Stain, MD  Time spent : 44mins  Brief narrative: 80 year old male patient with multiple medical problems including coronary artery disease and diabetes. Presented to the hospital with complaints of significant right upper quadrant pain for 2 weeks. This was associated with nausea and weakness. He apparently had been seen by his primary care provider 2 weeks prior for similar symptoms and was treated for musculoskeletal strain. No apparent fevers or chills at home. Since that time he has had progression in his symptoms and increased anorexia. He also now has right shoulder pain. Since that initial visit he has developed bilious vomiting and has fallen at least twice at home.  Upon evaluation in the ER he was found to have a white count of 42,000. CT of the abdomen and pelvis revealed an enlarged gallbladder with calcified gallstones and stranding concerning for acute cholecystitis. There is possibly a tiny calcified stone in the proximal bile duct. He also was noted to have acute renal failure w/ creatinine of 2.35 noting his baseline creatinine 1.2 back in February. He had mild transaminitis with an elevated alkaline phosphatase and only mildly elevated total bilirubin. General Surgery was contacted by the ER physician. Given his cardiac history preoperative clearance was recommended. In addition to the above findings patient had very soft blood pressures with systolic in the 10U despite IV fluids.  HPI/Subjective: Some nausea today +flatus, no BM   Assessment/Plan:  Acute cholecystitis/right upper quadrant abscess/Choledocholithiasis -s/p attempted lap chole - due to perforated GB underwent diag laparoscopy with drainage of RUQ abscess -follow Blake drains x 2- plan is eventual cholecystectomy  -Continue empiric antibiotics - WBCs cont to trend down ERCP with stone retrieval + sphincterotomy by Dr.  Ardis Hughs  Sepsis Resolved  Wheezing/dyspnea/possible HCAP: wheeze better. Change HHN to PRN.   Nausea: No vomiting. Slightly improved on scheduled reglan  CAD  Acute renal failure on CKD (chronic kidney disease), stage III Resolving  HYPOTHYROIDISM, BORDERLINE -Was not on medications prior to admission  DIABETES MELLITUS, TYPE II Controled  HYPERTENSION Blood pressure increasing. Will resume ACE inhibitor at lower dose now that creatinine normalizing.  DVT prophylaxis: SCd Code Status: Full Family Communication: patient Disposition Plan/Expected LOS: SNF when stable  Consultants: Gen. Surgery Gastroenterology Cardiology IR  Procedures: ERCP   Exploratory Laparoscopy w/ drainage placement x2  Antibiotics: Zosyn 3/29 >>> Vancomycin 3/29    Objective: Blood pressure 154/64, pulse 68, temperature 98.5 F (36.9 C), temperature source [name], resp. rate 20, height 5\' 11"  (1.803 m), weight 92.3 kg (203 lb 7.8 oz), SpO2 98.00%.  Intake/Output Summary (Last 24 hours) at 12/12/13 1007 Last data filed at 12/12/13 0914  Gross per 24 hour  Intake    600 ml  Output   2640 ml  Net  -2040 ml   Exam: General: Appears comfortable Lungs: Good air movement. CTA without WRR Cardiovascular: Regular rate and rhythm without murmur gallop or rub normal S1 and S2,  Abdomen: Distended. Soft. Decreased BM Musculoskeletal: No significant cyanosis, clubbing of bilateral lower extremities  Scheduled Meds:  Scheduled Meds: . albuterol  2.5 mg Nebulization BID  . carvedilol  20 mg [name] Daily  . enoxaparin (LOVENOX) injection  40 mg Subcutaneous Q24H  . insulin aspart  0-5 Units Subcutaneous QHS  . insulin aspart  0-9 Units Subcutaneous TID WC  . lisinopril  10 mg [name] Daily  . metoCLOPramide (REGLAN) injection  5 mg Intravenous TID AC  .  piperacillin-tazobactam (ZOSYN)  IV  3.375 g Intravenous 3 times per day  . polyethylene glycol powder  1 Container [name] Once   Data  Reviewed: Basic Metabolic Panel:  Recent Labs Lab 12/06/13 0315 12/07/13 1130 12/08/13 0245 12/09/13 0554 12/11/13 1016  NA 136* 138 138 140 141  K 5.1 4.8 4.2 4.5 3.7  CL 102 106 106 109 108  CO2 17* 18* 17* 16* 19  GLUCOSE 85 153* 89 115* 168*  BUN 28* 40* 41* 41* 26*  CREATININE 2.11* 1.85* 1.62* 1.33 1.03  CALCIUM 8.2* 8.0* 7.8* 8.3* 8.3*   Liver Function Tests:  Recent Labs Lab 12/06/13 0315 12/07/13 1130 12/08/13 0245 12/09/13 0554 12/11/13 1016  AST 47* 31 24 20 16   ALT 54* 36 28 22 15   ALKPHOS 227* 130* 160* 124* 127*  BILITOT 1.5* 1.5* 1.4* 1.2 0.9  PROT 6.1 5.9* 5.5* 5.8* 5.8*  ALBUMIN 2.0* 1.9* 1.7* 1.6* 1.5*    Recent Labs Lab 12/05/13 1452 12/06/13 0315  LIPASE 22 15   CBC:  Recent Labs Lab 12/05/13 1452 12/06/13 0315 12/07/13 0328 12/08/13 0245 12/09/13 0554 12/11/13 1016  WBC 42.8* 23.3* 18.8* 15.0* 12.9* 9.4  NEUTROABS 40.2* 22.8* 17.2*  --   --   --   HGB 13.1 10.6* 9.5* 8.8* 9.7* 9.2*  HCT 36.8* 30.1* 27.9* 25.7* 28.7* 26.3*  MCV 96.3 94.4 96.2 95.5 96.3 94.6  PLT 327 271 259 235 274 267   CBG:  Recent Labs Lab 12/11/13 0753 12/11/13 1152 12/11/13 1720 12/11/13 2218 12/12/13 0801  GLUCAP 122* 142* 124* 126* 118*    Recent Results (from the past 240 hour(s))  MRSA PCR SCREENING     Status: None   Collection Time    12/05/13  8:30 PM      Result Value Ref Range Status   MRSA by PCR NEGATIVE  NEGATIVE Final   Comment:            The GeneXpert MRSA Assay (FDA     approved for NASAL specimens     only), is one component of a     comprehensive MRSA colonization     surveillance program. It is not     intended to diagnose MRSA     infection nor to guide or     monitor treatment for     MRSA infections.  CULTURE, BLOOD (ROUTINE X 2)     Status: None   Collection Time    12/05/13  9:00 PM      Result Value Ref Range Status   Specimen Description BLOOD LEFT ARM   Final   Special Requests     Final   Value: BOTTLES  DRAWN AEROBIC AND ANAEROBIC 10CC AEROBIC 8CC ANAEROBIC   Culture  Setup Time     Final   Value: 12/06/2013 01:37     Performed at [facility]   Culture     Final   Value:        BLOOD CULTURE RECEIVED NO GROWTH TO DATE CULTURE WILL BE HELD FOR 5 DAYS BEFORE ISSUING A FINAL NEGATIVE REPORT     Performed at [facility]   Report Status PENDING   Incomplete  CULTURE, BLOOD (ROUTINE X 2)     Status: None   Collection Time    12/05/13  9:30 PM      Result Value Ref Range Status   Specimen Description BLOOD LEFT HAND   Final   Special Requests BOTTLES DRAWN AEROBIC  ONLY 5CC   Final   Culture  Setup Time     Final   Value: 12/06/2013 01:36     Performed at [facility]   Culture     Final   Value:        BLOOD CULTURE RECEIVED NO GROWTH TO DATE CULTURE WILL BE HELD FOR 5 DAYS BEFORE ISSUING A FINAL NEGATIVE REPORT     Performed at [facility]   Report Status PENDING   Incomplete  URINE CULTURE     Status: None   Collection Time    12/06/13  2:24 AM      Result Value Ref Range Status   Specimen Description URINE, CLEAN CATCH   Final   Special Requests NONE   Final   Culture  Setup Time     Final   Value: 12/06/2013 09:13     Performed at [place]     Final   Value: 1,000 COLONIES/ML     Performed at [facility]   Culture     Final   Value: INSIGNIFICANT GROWTH     Performed at [facility]   Report Status 12/07/2013 FINAL   Final     Studies:  CLINICAL DATA: Abdominal distension and wheezing.  EXAM:  ACUTE ABDOMEN SERIES (ABDOMEN 2 VIEW & CHEST 1 VIEW)  COMPARISON: DG ABD PORTABLE 1V dated 12/08/2013; CT ABD/PELV WO CM  dated 12/05/2013  FINDINGS:  Low lung volumes. Cardiac silhouette is enlarged. Areas of linear  density project within the left lung base.  Residual contrast is appreciated within the colon. Air is seen  within nondilated loops of small bowel. Surgical drain is  appreciated within the  right upper quadrant. Fusion of the lower  lumbar spine is demonstrated. Osseous structures otherwise  unremarkable.  IMPRESSION:  Likely atelectasis left lung base and infiltrate cannot be excluded.  Nonobstructive bowel gas pattern with a residua of radiopaque  contrast. Surgical drain right upper quadrant.   Eulogio Bear [facility] [phone]  If 7PM-7AM, please contact night-coverage [email or web address] Password [email or web address] 12/12/2013, 10:07 AM   LOS: 7 days

## 2013-12-13 ENCOUNTER — Encounter (HOSPITAL_COMMUNITY): Payer: Self-pay

## 2013-12-13 DIAGNOSIS — K5900 Constipation, unspecified: Secondary | ICD-10-CM

## 2013-12-13 LAB — CBC
HCT: 26.4 % — ABNORMAL LOW (ref 39.0–52.0)
Hemoglobin: 9.2 g/dL — ABNORMAL LOW (ref 13.0–17.0)
MCH: 33 pg (ref 26.0–34.0)
MCHC: 34.8 g/dL (ref 30.0–36.0)
MCV: 94.6 fL (ref 78.0–100.0)
Platelets: 284 10*3/uL (ref 150–400)
RBC: 2.79 MIL/uL — ABNORMAL LOW (ref 4.22–5.81)
RDW: 14.9 % (ref 11.5–15.5)
WBC: 10 10*3/uL (ref 4.0–10.5)

## 2013-12-13 LAB — COMPREHENSIVE METABOLIC PANEL
ALBUMIN: 1.7 g/dL — AB (ref 3.5–5.2)
ALK PHOS: 107 U/L (ref 39–117)
ALT: 13 U/L (ref 0–53)
AST: 14 U/L (ref 0–37)
BILIRUBIN TOTAL: 0.9 mg/dL (ref 0.3–1.2)
BUN: 16 mg/dL (ref 6–23)
CHLORIDE: 104 meq/L (ref 96–112)
CO2: 22 mEq/L (ref 19–32)
Calcium: 8.2 mg/dL — ABNORMAL LOW (ref 8.4–10.5)
Creatinine, Ser: 0.97 mg/dL (ref 0.50–1.35)
GFR calc non Af Amer: 75 mL/min — ABNORMAL LOW (ref >90)
GFR, EST AFRICAN AMERICAN: 87 mL/min — AB (ref >90)
GLUCOSE: 110 mg/dL — AB (ref 70–99)
POTASSIUM: 3.2 meq/L — AB (ref 3.7–5.3)
Sodium: 138 mEq/L (ref 137–147)
Total Protein: 6.1 g/dL (ref 6.0–8.3)

## 2013-12-13 LAB — GLUCOSE, CAPILLARY
GLUCOSE-CAPILLARY: 105 mg/dL — AB (ref 70–99)
GLUCOSE-CAPILLARY: 121 mg/dL — AB (ref 70–99)
Glucose-Capillary: 153 mg/dL — ABNORMAL HIGH (ref 70–99)
Glucose-Capillary: 124 mg/dL — ABNORMAL HIGH (ref 70–99)

## 2013-12-13 MED ORDER — ONDANSETRON HCL 4 MG PO TABS
4.0000 mg | ORAL_TABLET | ORAL | Status: DC | PRN
Start: 2013-12-13 — End: 2013-12-15

## 2013-12-13 MED ORDER — PROCHLORPERAZINE 25 MG RE SUPP
25.0000 mg | Freq: Two times a day (BID) | RECTAL | Status: DC | PRN
  Administered 2013-12-15: 25 mg via RECTAL
  Filled 2013-12-13 (×3): qty 1

## 2013-12-13 MED ORDER — POTASSIUM CHLORIDE CRYS ER 20 MEQ PO TBCR
40.0000 meq | EXTENDED_RELEASE_TABLET | ORAL | Status: AC
  Administered 2013-12-13 (×2): 40 meq via ORAL
  Filled 2013-12-13 (×2): qty 2

## 2013-12-13 MED ORDER — OXYCODONE HCL 5 MG PO TABS
5.0000 mg | ORAL_TABLET | ORAL | Status: DC | PRN
  Administered 2013-12-13: 5 mg via ORAL
  Administered 2013-12-13 – 2013-12-14 (×2): 10 mg via ORAL
  Filled 2013-12-13: qty 1
  Filled 2013-12-13 (×2): qty 2

## 2013-12-13 MED ORDER — ALBUTEROL SULFATE (2.5 MG/3ML) 0.083% IN NEBU: 2.5000 mg | INHALATION_SOLUTION | Freq: Four times a day (QID) | RESPIRATORY_TRACT | Status: DC | PRN

## 2013-12-13 MED ORDER — BISACODYL 10 MG RE SUPP
10.0000 mg | Freq: Once | RECTAL | Status: AC
  Administered 2013-12-13: 10 mg via RECTAL
  Filled 2013-12-13: qty 1

## 2013-12-13 MED ORDER — FLEET ENEMA 7-19 GM/118ML RE ENEM
1.0000 | ENEMA | Freq: Once | RECTAL | Status: DC
  Filled 2013-12-13: qty 1

## 2013-12-13 MED ORDER — ENSURE COMPLETE PO LIQD
237.0000 mL | Freq: Two times a day (BID) | ORAL | Status: DC
  Administered 2013-12-13 – 2013-12-15 (×4): 237 mL via ORAL

## 2013-12-13 MED ORDER — ONDANSETRON HCL 4 MG/2ML IJ SOLN
4.0000 mg | INTRAMUSCULAR | Status: DC | PRN
  Administered 2013-12-13 – 2013-12-15 (×10): 4 mg via INTRAVENOUS
  Filled 2013-12-13 (×10): qty 2

## 2013-12-13 NOTE — Progress Notes (Signed)
Patient ID: [name] [name], male   DOB: 13-Oct-1928, 80 y.o.   MRN: [id number] 7 Days Post-Op  Subjective: Pt continues to slowly feel better.  Having constant nausea.  Eating about 50% of meals. Passing flatus.  Objective: Vital signs in last 24 hours: Temp:  [98.4 F (36.9 C)-98.5 F (36.9 C)] 98.5 F (36.9 C) (04/06 0608) Pulse Rate:  [64-66] 65 (04/06 0608) Resp:  [16-20] 20 (04/06 0608) BP: (152-166)/(65-66) 166/66 mmHg (04/06 0608) SpO2:  [95 %-97 %] 95 % (04/06 0608) Last BM Date: 12/05/13  Intake/Output from previous day: 04/05 0701 - 04/06 0700 In: 31 [P.O.:360; I.V.:230; IV Piggyback:300] Out: 1800 [Urine:1750; Drains:50] Intake/Output this shift:    PE: Abd: soft, minimal RUQ tenderness, +BS, ND, JP 1 and 2 with serosang output.  No bilious output noted Lungs: CTAB  Lab Results:   Recent Labs  12/11/13 1016 12/13/13 0640  WBC 9.4 10.0  HGB 9.2* 9.2*  HCT 26.3* 26.4*  PLT 267 284   BMET  Recent Labs  12/11/13 1016 12/13/13 0640  NA 141 138  K 3.7 3.2*  CL 108 104  CO2 19 22  GLUCOSE 168* 110*  BUN 26* 16  CREATININE 1.03 0.97  CALCIUM 8.3* 8.2*   PT/INR No results found for this basename: LABPROT, INR,  in the last 72 hours CMP     Component Value Date/Time   NA 138 12/13/2013 0640   K 3.2* 12/13/2013 0640   CL 104 12/13/2013 0640   CO2 22 12/13/2013 0640   GLUCOSE 110* 12/13/2013 0640   BUN 16 12/13/2013 0640   CREATININE 0.97 12/13/2013 0640   CALCIUM 8.2* 12/13/2013 0640   PROT 6.1 12/13/2013 0640   ALBUMIN 1.7* 12/13/2013 0640   AST 14 12/13/2013 0640   ALT 13 12/13/2013 0640   ALKPHOS 107 12/13/2013 0640   BILITOT 0.9 12/13/2013 0640   GFRNONAA 75* 12/13/2013 0640   GFRAA 87* 12/13/2013 0640   Lipase     Component Value Date/Time   LIPASE 15 12/06/2013 0315       Studies/Results: No results found.  Anti-infectives: Anti-infectives   Start     Dose/Rate Route Frequency Ordered Stop   12/06/13 1200  piperacillin-tazobactam (ZOSYN) IVPB 3.375 g   Status:  Discontinued     3.375 g 100 mL/hr over 30 Minutes Intravenous 4 times per day 12/06/13 0924 12/06/13 0933   12/06/13 0945  piperacillin-tazobactam (ZOSYN) IVPB 3.375 g     3.375 g 12.5 mL/hr over 240 Minutes Intravenous 3 times per day 12/06/13 0935     12/05/13 1900  vancomycin (VANCOCIN) IVPB 1000 mg/200 mL premix     1,000 mg 200 mL/hr over 60 Minutes Intravenous  Once 12/05/13 1849 12/05/13 2038   12/05/13 1815  piperacillin-tazobactam (ZOSYN) IVPB 3.375 g     3.375 g 100 mL/hr over 30 Minutes Intravenous  Once 12/05/13 1808 12/05/13 1942       Assessment/Plan  1. POD 7, s/p diagnostic laparoscopy with drain placement and ERCP  Plan: 1. Patient slowly improving.  Will add chocolate ensure to help with calorie intake 2. No planned imaging at this time as patient is overall improving.  He is AF, no WBC, and minimal abdominal pain. 3. Cont PT 4. Cont abx therapy. 5. Will see if there are any adjustments that can be done for nausea.  LOS: 8 days    Sheranda Seabrooks E 12/13/2013, 8:33 AM Pager: [phone]

## 2013-12-13 NOTE — Progress Notes (Signed)
I saw the patient, participated in the history, exam and medical decision making, and concur with the physician assistant's note above.  Looks pretty good. Appetite marginal No emesis but nausea Alert, nad cta b/l Soft, nd, drains - serosang; no bile  Cont IV abx due to intra-abd infection Pt/ot Stay on fulls today Right now see no need to repeat imaging - vitals stable, labs ok, no signif pain, no evid of bile leak  Leighton Ruff. Redmond Pulling, MD, FACS General, Bariatric, & Minimally Invasive Surgery [facility], [place]

## 2013-12-13 NOTE — Progress Notes (Signed)
Seen and agreed 12/13/2013 Jacqualyn Posey PTA [phone] pager [phone] office

## 2013-12-13 NOTE — Progress Notes (Signed)
Progress Note  MERRIL NAGY GBT:[id number] DOB: [birth year] DOA: 12/05/2013 PCP: Elsie Stain, MD  Time spent : 2mins  Brief narrative: 80 year old male patient with multiple medical problems including coronary artery disease and diabetes. Presented to the hospital with complaints of significant right upper quadrant pain for 2 weeks. This was associated with nausea and weakness. He apparently had been seen by his primary care provider 2 weeks prior for similar symptoms and was treated for musculoskeletal strain. No apparent fevers or chills at home. Since that time he has had progression in his symptoms and increased anorexia. He also now has right shoulder pain. Since that initial visit he has developed bilious vomiting and has fallen at least twice at home.  Upon evaluation in the ER he was found to have a white count of 42,000. CT of the abdomen and pelvis revealed an enlarged gallbladder with calcified gallstones and stranding concerning for acute cholecystitis. There is possibly a tiny calcified stone in the proximal bile duct. He also was noted to have acute renal failure w/ creatinine of 2.35 noting his baseline creatinine 1.2 back in February. He had mild transaminitis with an elevated alkaline phosphatase and only mildly elevated total bilirubin. General Surgery was contacted by the ER physician. Given his cardiac history preoperative clearance was recommended. In addition to the above findings patient had very soft blood pressures with systolic in the 71G despite IV fluids.  HPI/Subjective: Some nausea today +flatus, no BM   Assessment/Plan:  Acute cholecystitis/right upper quadrant abscess/Choledocholithiasis -s/p attempted lap chole - due to perforated GB underwent diag laparoscopy with drainage of RUQ abscess -follow Blake drains x 2- plan is eventual cholecystectomy  -Continue empiric antibiotics - WBCs cont to trend down ERCP with stone retrieval + sphincterotomy by Dr.  Ardis Hughs  Sepsis Resolved  Wheezing/dyspnea/possible HCAP: wheeze better. Change HHN to PRN.   Nausea: No vomiting. Slightly improved on scheduled reglan  CAD  Acute renal failure on CKD (chronic kidney disease), stage III Resolving  HYPOTHYROIDISM, BORDERLINE -Was not on medications prior to admission  DIABETES MELLITUS, TYPE II Controled  HYPERTENSION Blood pressure increasing. Will resume ACE inhibitor at lower dose now that creatinine normalizing.  Constipation -added miralax with no BM -add suppository and then enema if needed   DVT prophylaxis: SCd Code Status: Full Family Communication: patient Disposition Plan/Expected LOS: SNF when stable  Consultants: Gen. Surgery Gastroenterology Cardiology IR  Procedures: ERCP   Exploratory Laparoscopy w/ drainage placement x2  Antibiotics: Zosyn 3/29 >>> Vancomycin 3/29    Objective: Blood pressure 166/66, pulse 65, temperature 98.5 F (36.9 C), temperature source Oral, resp. rate 20, height 5\' 11"  (1.803 m), weight 92.3 kg (203 lb 7.8 oz), SpO2 95.00%.  Intake/Output Summary (Last 24 hours) at 12/13/13 1013 Last data filed at 12/13/13 0600  Gross per 24 hour  Intake    890 ml  Output   1100 ml  Net   -210 ml   Exam: General: Appears comfortable Lungs: Good air movement. CTA without WRR Cardiovascular: Regular rate and rhythm without murmur gallop or rub normal S1 and S2,  Abdomen: Distended. Soft. Decreased BM Musculoskeletal: No significant cyanosis, clubbing of bilateral lower extremities  Scheduled Meds:  Scheduled Meds: . albuterol  2.5 mg Nebulization BID  . carvedilol  20 mg Oral Daily  . enoxaparin (LOVENOX) injection  40 mg Subcutaneous Q24H  . feeding supplement (ENSURE COMPLETE)  237 mL Oral BID BM  . insulin aspart  0-5 Units Subcutaneous QHS  .  insulin aspart  0-9 Units Subcutaneous TID WC  . lisinopril  10 mg Oral Daily  . piperacillin-tazobactam (ZOSYN)  IV  3.375 g Intravenous 3  times per day   Data Reviewed: Basic Metabolic Panel:  Recent Labs Lab 12/07/13 1130 12/08/13 0245 12/09/13 0554 12/11/13 1016 12/13/13 0640  NA 138 138 140 141 138  K 4.8 4.2 4.5 3.7 3.2*  CL 106 106 109 108 104  CO2 18* 17* 16* 19 22  GLUCOSE 153* 89 115* 168* 110*  BUN 40* 41* 41* 26* 16  CREATININE 1.85* 1.62* 1.33 1.03 0.97  CALCIUM 8.0* 7.8* 8.3* 8.3* 8.2*   Liver Function Tests:  Recent Labs Lab 12/07/13 1130 12/08/13 0245 12/09/13 0554 12/11/13 1016 12/13/13 0640  AST 31 24 20 16 14   ALT 36 28 22 15 13   ALKPHOS 130* 160* 124* 127* 107  BILITOT 1.5* 1.4* 1.2 0.9 0.9  PROT 5.9* 5.5* 5.8* 5.8* 6.1  ALBUMIN 1.9* 1.7* 1.6* 1.5* 1.7*   No results found for this basename: LIPASE, AMYLASE,  in the last 168 hours CBC:  Recent Labs Lab 12/07/13 0328 12/08/13 0245 12/09/13 0554 12/11/13 1016 12/13/13 0640  WBC 18.8* 15.0* 12.9* 9.4 10.0  NEUTROABS 17.2*  --   --   --   --   HGB 9.5* 8.8* 9.7* 9.2* 9.2*  HCT 27.9* 25.7* 28.7* 26.3* 26.4*  MCV 96.2 95.5 96.3 94.6 94.6  PLT 259 235 274 267 284   CBG:  Recent Labs Lab 12/12/13 0801 12/12/13 1143 12/12/13 1722 12/12/13 2221 12/13/13 0812  GLUCAP 118* 146* 127* 122* 105*    Recent Results (from the past 240 hour(s))  MRSA PCR SCREENING     Status: None   Collection Time    12/05/13  8:30 PM      Result Value Ref Range Status   MRSA by PCR NEGATIVE  NEGATIVE Final   Comment:            The GeneXpert MRSA Assay (FDA     approved for NASAL specimens     only), is one component of a     comprehensive MRSA colonization     surveillance program. It is not     intended to diagnose MRSA     infection nor to guide or     monitor treatment for     MRSA infections.  CULTURE, BLOOD (ROUTINE X 2)     Status: None   Collection Time    12/05/13  9:00 PM      Result Value Ref Range Status   Specimen Description BLOOD LEFT ARM   Final   Special Requests     Final   Value: BOTTLES DRAWN AEROBIC AND  ANAEROBIC 10CC AEROBIC 8CC ANAEROBIC   Culture  Setup Time     Final   Value: 12/06/2013 01:37     Performed at [facility]   Culture     Final   Value: NO GROWTH 5 DAYS     Performed at [facility]   Report Status 12/12/2013 FINAL   Final  CULTURE, BLOOD (ROUTINE X 2)     Status: None   Collection Time    12/05/13  9:30 PM      Result Value Ref Range Status   Specimen Description BLOOD LEFT HAND   Final   Special Requests BOTTLES DRAWN AEROBIC ONLY 5CC   Final   Culture  Setup Time     Final   Value: 12/06/2013  01:36     Performed at [facility]     Final   Value: NO GROWTH 5 DAYS     Performed at [facility]   Report Status 12/12/2013 FINAL   Final  URINE CULTURE     Status: None   Collection Time    12/06/13  2:24 AM      Result Value Ref Range Status   Specimen Description URINE, CLEAN CATCH   Final   Special Requests NONE   Final   Culture  Setup Time     Final   Value: 12/06/2013 09:13     Performed at [place]     Final   Value: 1,000 COLONIES/ML     Performed at [facility]   Culture     Final   Value: INSIGNIFICANT GROWTH     Performed at [facility]   Report Status 12/07/2013 FINAL   Final     Studies:  CLINICAL DATA: Abdominal distension and wheezing.  EXAM:  ACUTE ABDOMEN SERIES (ABDOMEN 2 VIEW & CHEST 1 VIEW)  COMPARISON: DG ABD PORTABLE 1V dated 12/08/2013; CT ABD/PELV WO CM  dated 12/05/2013  FINDINGS:  Low lung volumes. Cardiac silhouette is enlarged. Areas of linear  density project within the left lung base.  Residual contrast is appreciated within the colon. Air is seen  within nondilated loops of small bowel. Surgical drain is  appreciated within the right upper quadrant. Fusion of the lower  lumbar spine is demonstrated. Osseous structures otherwise  unremarkable.  IMPRESSION:  Likely atelectasis left lung base and infiltrate cannot be excluded.    Nonobstructive bowel gas pattern with a residua of radiopaque  contrast. Surgical drain right upper quadrant.   Eulogio Bear [facility] [phone]  If 7PM-7AM, please contact night-coverage [email or web address] Password [email or web address] 12/13/2013, 10:13 AM   LOS: 8 days

## 2013-12-13 NOTE — Progress Notes (Signed)
Physical Therapy Treatment Patient Details Name: [name] [name] MRN: [id number] DOB: November 01, 1928 Today's Date: 12/13/2013    History of Present Illness Patient is an 80 y/o male with PMH of 2 DM, CAD s/p CABG, HLD presenting with cholecystitis and sepsis.  Now s/p ERCP with sphincterotomy/papillotomy and ERCP with removal of calculus/calculi and drainage of right upper quadrant abcess.    PT Comments    Patient requires O2 monitoring with therapy. Patient felt nervous momentarily in halls with gait training. Patient was returned to room and reports feeling better. RN staff in room pretherapy to discuss diet and food modification. Continue to recommend SNF at this time for safety and reassurance.    Follow Up Recommendations        Equipment Recommendations       Recommendations for Other Services       Precautions / Restrictions Precautions Precautions: Fall Restrictions Weight Bearing Restrictions: No    Mobility  Bed Mobility Overal bed mobility: Needs Assistance Bed Mobility: Sit to Supine;Sit to Sidelying       Sit to supine: Min assist Sit to sidelying: Min assist General bed mobility comments: Assist to hellp cue for hand placement on rails with bed mobility. Patient required Min A for LE  Transfers Overall transfer level: Needs assistance Equipment used: Rolling walker (2 wheeled) Transfers: Sit to/from Stand Sit to Stand: Min assist         General transfer comment: assist for stability upon standing  Ambulation/Gait Ambulation/Gait assistance: Min assist Ambulation Distance (Feet): 200 Feet Assistive device: Rolling walker (2 wheeled) Gait Pattern/deviations: Step-through pattern;Decreased stride length;Trunk flexed;Shuffle Gait velocity: decreased   General Gait Details: Patient required cues for proper technique with RW and cues for upright posture. Patient required Min A for safety and RW guidence with maneuvering in hall.    Stairs             Wheelchair Mobility    Modified Rankin (Stroke Patients Only)       Balance Overall balance assessment: Needs assistance         Standing balance support: Bilateral upper extremity supported Standing balance-Leahy Scale: Poor Standing balance comment: UE support needed for standing balance                    Cognition Arousal/Alertness: Awake/alert Behavior During Therapy: WFL for tasks assessed/performed Overall Cognitive Status: Within Functional Limits for tasks assessed                      Exercises      General Comments        Pertinent Vitals/Pain Patient denies pain. O2 sats OOB 76% / with gait training 80% / post therapy 78% measured with portable pulse oximetry device.    Home Living                      Prior Function            PT Goals (current goals can now be found in the care plan section) Progress towards PT goals: Progressing toward goals    Frequency       PT Plan Current plan remains appropriate    Co-evaluation             End of Session Equipment Utilized During Treatment: Gait belt;Oxygen Activity Tolerance: Patient limited by fatigue Patient left: in chair;with call bell/phone within reach;with nursing/sitter in room;with family/visitor present     Time: 1062-6948  PT Time Calculation (min): 24 min  Charges:                       G Codes:      Sayana Salley, SPTA 12/13/2013, 9:13 AM

## 2013-12-14 LAB — CBC
HCT: 28.4 % — ABNORMAL LOW (ref 39.0–52.0)
HEMOGLOBIN: 9.6 g/dL — AB (ref 13.0–17.0)
MCH: 32.2 pg (ref 26.0–34.0)
MCHC: 33.8 g/dL (ref 30.0–36.0)
MCV: 95.3 fL (ref 78.0–100.0)
Platelets: 312 10*3/uL (ref 150–400)
RBC: 2.98 MIL/uL — AB (ref 4.22–5.81)
RDW: 14.9 % (ref 11.5–15.5)
WBC: 10.8 10*3/uL — AB (ref 4.0–10.5)

## 2013-12-14 LAB — GLUCOSE, CAPILLARY
GLUCOSE-CAPILLARY: 107 mg/dL — AB (ref 70–99)
Glucose-Capillary: 184 mg/dL — ABNORMAL HIGH (ref 70–99)
Glucose-Capillary: 122 mg/dL — ABNORMAL HIGH (ref 70–99)
Glucose-Capillary: 152 mg/dL — ABNORMAL HIGH (ref 70–99)

## 2013-12-14 LAB — BASIC METABOLIC PANEL
BUN: 13 mg/dL (ref 6–23)
CHLORIDE: 105 meq/L (ref 96–112)
CO2: 22 meq/L (ref 19–32)
Calcium: 8.2 mg/dL — ABNORMAL LOW (ref 8.4–10.5)
Creatinine, Ser: 0.93 mg/dL (ref 0.50–1.35)
GFR calc Af Amer: 88 mL/min — ABNORMAL LOW (ref >90)
GFR calc non Af Amer: 76 mL/min — ABNORMAL LOW (ref >90)
GLUCOSE: 108 mg/dL — AB (ref 70–99)
Potassium: 3.7 mEq/L (ref 3.7–5.3)
SODIUM: 141 meq/L (ref 137–147)

## 2013-12-14 MED ORDER — METOCLOPRAMIDE HCL 5 MG PO TABS: 5.0000 mg | ORAL_TABLET | Freq: Four times a day (QID) | ORAL | Status: DC

## 2013-12-14 MED ORDER — ONDANSETRON HCL 4 MG PO TABS: 4.0000 mg | ORAL_TABLET | ORAL | Status: DC | PRN

## 2013-12-14 MED ORDER — ALBUTEROL SULFATE (2.5 MG/3ML) 0.083% IN NEBU: 2.5000 mg | INHALATION_SOLUTION | Freq: Four times a day (QID) | RESPIRATORY_TRACT | Status: DC | PRN

## 2013-12-14 MED ORDER — ENSURE COMPLETE PO LIQD: 237.0000 mL | Freq: Two times a day (BID) | ORAL | Status: DC

## 2013-12-14 MED ORDER — INSULIN ASPART 100 UNIT/ML ~~LOC~~ SOLN: 0.0000 [IU] | Freq: Three times a day (TID) | SUBCUTANEOUS | Status: DC

## 2013-12-14 MED ORDER — FAMOTIDINE 20 MG PO TABS: 20.0000 mg | ORAL_TABLET | Freq: Two times a day (BID) | ORAL | Status: DC | PRN

## 2013-12-14 MED ORDER — INSULIN ASPART 100 UNIT/ML ~~LOC~~ SOLN: 0.0000 [IU] | Freq: Every day | SUBCUTANEOUS | Status: DC

## 2013-12-14 MED ORDER — PROCHLORPERAZINE 25 MG RE SUPP: 25.0000 mg | Freq: Two times a day (BID) | RECTAL | Status: DC | PRN

## 2013-12-14 MED ORDER — OXYCODONE HCL 5 MG PO TABS: 5.0000 mg | ORAL_TABLET | ORAL | Status: DC | PRN

## 2013-12-14 MED ORDER — AMOXICILLIN-POT CLAVULANATE 875-125 MG PO TABS: 1.0000 | ORAL_TABLET | Freq: Two times a day (BID) | ORAL | Status: DC

## 2013-12-14 MED ORDER — AMOXICILLIN-POT CLAVULANATE 875-125 MG PO TABS
1.0000 | ORAL_TABLET | Freq: Two times a day (BID) | ORAL | Status: DC
Start: 2013-12-14 — End: 2013-12-15
  Administered 2013-12-14 – 2013-12-15 (×3): 1 via ORAL
  Filled 2013-12-14 (×5): qty 1

## 2013-12-14 NOTE — Progress Notes (Signed)
Agree with above.   Seen, and evaluated.   Follow up with [facility].

## 2013-12-14 NOTE — Discharge Instructions (Signed)
DO NOT REMOVE JP DRAIN!!!!!!!  Bulb Drain Home Care A bulb drain consists of a thin rubber tube and a soft, round bulb that creates a gentle suction. The rubber tube is placed in the area where you had surgery. A bulb is attached to the end of the tube that is outside the body. The bulb drain removes excess fluid that normally builds up in a surgical wound after surgery. The color and amount of fluid will vary. Immediately after surgery, the fluid is bright red and is a little thicker than water. It may gradually change to a yellow or pink color and become more thin and water-like. When the amount decreases to about 1 or 2 tbsp in 24 hours, your health care provider will usually remove it. DAILY CARE  Keep the bulb flat (compressed) at all times, except while emptying it. The flatness creates suction. You can flatten the bulb by squeezing it firmly in the middle and then closing the cap.  Keep sites where the tube enters the skin dry and covered with a bandage (dressing).  Secure the tube 1 2 in (2.5 5.1 cm) below the insertion sites, to keep it from pulling on your stitches. The tube is stitched in place and will not slip out.  Secure the bulb as directed by your health care provider.  For the first 3 days after surgery, there usually is more fluid in the bulb. Empty the bulb whenever it becomes half full because the bulb does not create enough suction if it is too full. The bulb could also overflow. Write down how much fluid you remove each time you empty your drain. Add up the amount removed in 24 hours.  Empty the bulb at the same time every day once the amount of fluid decreases and you only need to empty it once a day. Write down the amounts and the 24-hour totals to give to your health care provider. This helps your health care provider know when the tubes can be removed. EMPTYING THE BULB DRAIN Before emptying the bulb, get a measuring cup, a piece of paper, and a pen and wash your  hands.  Gently run your fingers down the tube (stripping) to empty any drainage from the tubing into the bulb. This may need to be done several times a day to clear the tubing of clots and tissue.  Open the bulb cap to release suction, which causes it to inflate. Do not touch the inside of the cap.  Gently run your fingers down the tube (stripping) to empty any drainage from the tubing into the bulb.  Hold the cap out of the way, and pour fluid into the measuring cup.   Squeeze the bulb to provide suction.  Replace the cap.   Check the tape that holds the tube to your skin. If it is becoming loose, you can remove the loose piece of tape and apply a new one. Then, pin the bulb to your shirt.   Write down the amount of fluid you emptied out. Write down the date and each time you emptied your bulb drain. (If there are 2 bulbs, note the amount of drainage from each bulb and keep the totals separate. Your health care provider will want to know the total amounts for each drain and which tube is draining more.)   Flush the fluid down the toilet and wash your hands.   Call your health care provider once you have less than 2 tbsp of fluid collecting  in the bulb drain every 24 hours. If there is drainage around the tube site, change dressings and keep the area dry. Cleanse around tube with sterile saline and place dry gauze around site. This gauze should be changed when it is soiled. If it stays clean and unsoiled, it should still be changed daily.  SEEK MEDICAL CARE IF:  Your drainage has a bad smell or is cloudy.   You have a fever.   Your drainage is increasing instead of decreasing.   Your tube fell out.   You have redness or swelling around the tube site.   You have drainage from a surgical wound.   Your bulb drain will not stay flat after you empty it.  MAKE SURE YOU:   Understand these instructions.  Will watch your condition.  Will get help right away if you are  not doing well or get worse. Document Released: 08/23/2000 Document Revised: 06/16/2013 Document Reviewed: 01/29/2012 [facility] Patient Information 2014 [place], [place].

## 2013-12-14 NOTE — Progress Notes (Signed)
Seen, agree with above.  [place], drains.   SNF when stable

## 2013-12-14 NOTE — Progress Notes (Signed)
Seen and agreed 12/14/2013 Robinette, Julia Elizabeth PTA [phone] pager [phone] office    

## 2013-12-14 NOTE — Discharge Summary (Addendum)
Physician Discharge Summary  [name] [name] URK:[id number] DOB: 05-15-1929 DOA: 12/05/2013  PCP: Elsie Stain, MD  Admit date: 12/05/2013 Discharge date: 12/15/2013  Time spent: 35 minutes  Recommendations for Outpatient Follow-up:  1. D/c to [facility] 2. D/c with drain- surgery follow up in 2 week- drain care at SNF 3. Monitor blood sugars  Discharge Diagnoses:  Active Problems:   HYPOTHYROIDISM, BORDERLINE   DIABETES MELLITUS, TYPE II   HYPERTENSION   CAD   Acute cholecystitis   Choledocholithiasis   Sepsis   Hypotension, unspecified   CKD (chronic kidney disease), stage III   Acute renal failure   Wheezing   possible HCAP (healthcare-associated pneumonia)   Discharge Condition: improved  Diet recommendation: DYS 2 diet  Filed Weights   12/12/13 0519 12/14/13 0514 12/15/13 0559  Weight: 92.3 kg (203 lb 7.8 oz) 92.6 kg (204 lb 2.3 oz) 91.5 kg (201 lb 11.5 oz)    History of present illness:  History of Present Illness:This is a 80 y.o. year old male with multiple medical problems including type 2 DM, CAD s/p CABG, HLD presenting with cholecystitis and sepsis. Patient states that he's had right upper quadrant/right rib pain for the past 2 weeks with associated nausea and weakness. Was seen by PCP about 2 weeks ago for symptoms. Was told it was a musculoskeletal strain. Denies any fevers or chills. Has had progressive decreased appetite as well as intermittent episodes of bilious vomiting. Has had 1-2 falls at home. No direct head trauma or loss of consciousness. Denies any chest pain, shortness of breath, diarrhea, dysuria, hemiparesis, confusion. We'll draw to the ER by daughter for worsening symptoms.  Was no data White blood cell count of greater than 42,000. CT of the abdomen and pelvis shows enlarged gallbladder with calcified gallstones and stranding concerning for acute cholecystitis as well as possible tiny calcified stone in the proximal bile duct. Creatinine also  noted 2.35 with a baseline creatinine of 1.2. AST/ALT mildly elevated at 54/64 respectively. ALP markedly elevated @ 270. tbili @ 1.5. General surgery was contacted by ER physician. Per ER physician, surgeon recommends medical mission because of comorbidities. May also need possible preoperative clearance. Noted note by Dr. Johnsie Cancel 07/2013 showed patient otherwise medically optimized prior to dental and ophthalmologic procedure. Blood pressures have persisted in the 90s to 100s despite IV fluids. Baseline blood pressures 130s 140s per family.   Hospital Course:  Acute cholecystitis/right upper quadrant abscess/Choledocholithiasis  -s/p attempted lap chole - due to perforated GB underwent diag laparoscopy with drainage of RUQ abscess  -follow Blake drains x 2- plan is eventual cholecystectomy  augmentin course finished ERCP with stone retrieval + sphincterotomy by Dr. Ardis Hughs   Sepsis  Resolved   Wheezing/dyspnea/possible HCAP: wheeze better. Change HHN to PRN.   Nausea: No vomiting. Slightly improved on scheduled reglan   CAD   Acute renal failure on CKD (chronic kidney disease), stage III  Resolving   HYPOTHYROIDISM, BORDERLINE  -Was not on medications prior to admission   DIABETES MELLITUS, TYPE II  Controled   HYPERTENSION  Blood pressure increasing. Will resume ACE inhibitor at lower dose now that creatinine normalizing.   Constipation  -needs bowel regimin   Procedures: s/p diagnostic laparoscopy with drain placement and ERCP   Consultations:  Surgery  GI  Discharge Exam: Filed Vitals:   12/15/13 0559  BP: 152/66  Pulse: 68  Temp: 98.2 F (36.8 C)  Resp: 18    General:pleasant/cooperative Cardiovascular: rrr Respiratory: clear  Discharge  Instructions You were cared for by a hospitalist during your hospital stay. If you have any questions about your discharge medications or the care you received while you were in the hospital after you are discharged, you  can call the unit and asked to speak with the hospitalist on call if the hospitalist that took care of you is not available. Once you are discharged, your primary care physician will handle any further medical issues. Please note that NO REFILLS for any discharge medications will be authorized once you are discharged, as it is imperative that you return to your primary care physician (or establish a relationship with a primary care physician if you do not have one) for your aftercare needs so that they can reassess your need for medications and monitor your lab values.      Discharge Orders   Future Appointments Provider Department Dept Phone   12/20/2013 2:30 PM Rolm Bookbinder, MD [facility] Surgery, [place]   03/24/2014 1:00 PM Audelia Acton [facility] Neurology [facility] [phone]   Future Orders Complete By Expires   Diet - low sodium heart healthy  As directed    Discharge instructions  As directed    Increase activity slowly  As directed        Medication List    STOP taking these medications       NEXIUM 40 MG capsule  Generic drug:  esomeprazole      TAKE these medications       albuterol (2.5 MG/3ML) 0.083% nebulizer solution  Commonly known as:  PROVENTIL  Take 3 mLs (2.5 mg total) by nebulization every 6 (six) hours as needed for wheezing or shortness of breath.     allopurinol 300 MG tablet  Commonly known as:  ZYLOPRIM  Take 300 mg by mouth daily.     amoxicillin-clavulanate 875-125 MG per tablet  Commonly known as:  AUGMENTIN  Take 1 tablet by mouth every 12 (twelve) hours.     atorvastatin 40 MG tablet  Commonly known as:  LIPITOR  Take 40 mg by mouth daily.     carvedilol 20 MG 24 hr capsule  Commonly known as:  COREG CR  Take 1 capsule (20 mg total) by mouth daily.     famotidine 20 MG tablet  Commonly known as:  PEPCID  Take 1 tablet (20 mg total) by mouth 2 (two) times daily as needed for heartburn.     feeding supplement (ENSURE  COMPLETE) Liqd  Take 237 mLs by mouth 2 (two) times daily between meals.     insulin aspart 100 UNIT/ML injection  Commonly known as:  novoLOG  Inject 0-5 Units into the skin at bedtime.     insulin aspart 100 UNIT/ML injection  Commonly known as:  novoLOG  Inject 0-9 Units into the skin 3 (three) times daily with meals.     lisinopril 40 MG tablet  Commonly known as:  PRINIVIL,ZESTRIL  Take 40 mg by mouth daily. 1  TAB  EVERY DAY     metoCLOPramide 5 MG tablet  Commonly known as:  REGLAN  Take 1 tablet (5 mg total) by mouth 4 (four) times daily.     ondansetron 4 MG tablet  Commonly known as:  ZOFRAN  Take 1 tablet (4 mg total) by mouth every 4 (four) hours as needed for nausea.     oxyCODONE 5 MG immediate release tablet  Commonly known as:  Oxy IR/ROXICODONE  Take 1-2 tablets (5-10 mg total) by  mouth every 4 (four) hours as needed for moderate pain.     primidone 50 MG tablet  Commonly known as:  MYSOLINE  Take 25 mg by mouth daily.     prochlorperazine 25 MG suppository  Commonly known as:  COMPAZINE  Place 1 suppository (25 mg total) rectally every 12 (twelve) hours as needed for nausea or vomiting.       Allergies  Allergen Reactions  . Sulfonamide Derivatives     REACTION: lethargic   Follow-up Information   Follow up with Elsie Stain, MD In 1 week.   Specialty:  Family Medicine   Contact information:   [place] [address] [phone]       Follow up with Rolm Bookbinder, MD On 12/20/2013. (arrive at 2:15pm for a 2:30pm appointment)    Specialty:  General Surgery   Contact information:   [place] [address] [phone]        The results of significant diagnostics from this hospitalization (including imaging, microbiology, ancillary and laboratory) are listed below for reference.    Significant Diagnostic Studies: Ct Abdomen Pelvis Wo Contrast  12/05/2013   CLINICAL DATA:  Abdominal pain  and vomiting.  Weakness.  EXAM: CT ABDOMEN AND PELVIS WITHOUT CONTRAST  TECHNIQUE: Multidetector CT imaging of the abdomen and pelvis was performed following the standard protocol without intravenous contrast.  COMPARISON:  CT ABDOMEN W/O CM dated 01/28/2006  FINDINGS: Lung Bases: Right-greater-than-left basilar atelectasis.  Liver: Low attenuation is present in the right hepatic lobe adjacent to the gallbladder fossa, suspicious for hepatic edema. Focal fatty infiltration can also have this appearance.  Spleen:  Old granulomatous disease.  Gallbladder: Hydropic gallbladder, with calcified gallstones in the gallbladder neck. Suspect tiny calcified stone in the proximal common bile duct. Mild stranding is present around the gallbladder, again supporting a diagnosis of acute cholecystitis. Gallbladder ultrasound recommended for further assessment.  Common bile duct: Possible tiny gallstone in the proximal common bile duct. Distal common bile duct within normal limits for age.  Pancreas:  Normal.  Adrenal glands:  Normal.  Kidneys: Low-density lesions are present in the kidneys compatible with hepatic cysts. The ureters appear within normal limits.  Stomach: Moderate hiatal hernia. Contrast present in the distal esophagus an hernia. Stomach is distended with contrast.  Small bowel: Duodenum appears normal. Small bowel is normal. No mesenteric adenopathy.  Colon: Normal appendix. Colonic diverticulosis. There is no diverticulitis.  Pelvic Genitourinary: Urinary bladder within normal limits. Prostatomegaly with 52 mm transverse prostate span. No free fluid.  Bones: Posterior lumbar interbody fusion. No aggressive osseous lesions.  Vasculature: Atherosclerosis without gross acute vascular abnormality.  Body Wall: Tiny fat containing periumbilical hernia.  IMPRESSION: 1. Hydropic gallbladder with calcified gallstones and mild stranding suspicious for acute cholecystitis. Consider correlation with ultrasound. 2. Possible  tiny calcified stone in the proximal common bile duct. 3. Low attenuation in the liver adjacent to the gallbladder fossa may represent hepatic edema in the setting of cholecystitis or focal fatty infiltration. 4. Small hiatal hernia. 5. Renal cysts.   Electronically Signed   By: Dereck Ligas M.D.   On: 12/05/2013 17:37   Dg Chest Port 1 View  12/06/2013   CLINICAL DATA:  Respiratory distress after cholecystectomy.  EXAM: PORTABLE CHEST - 1 VIEW  COMPARISON:  DG CHEST 2 VIEW dated 02/14/2012  FINDINGS: Lung volumes are very low with bibasilar atelectasis present, right greater than left. No overt edema or focal airspace consolidation  is seen. The heart is enlarged and patient has had prior CABG. No evidence of pneumothorax.  IMPRESSION: Very low lung volumes with bibasilar atelectasis postoperatively.   Electronically Signed   By: Aletta Edouard M.D.   On: 12/06/2013 17:11   Dg Ercp Biliary & Pancreatic Ducts  12/06/2013   CLINICAL DATA:  CBD stone  EXAM: ERCP  TECHNIQUE: Multiple spot images obtained with the fluoroscopic device and submitted for interpretation post-procedure.  COMPARISON:  CT ABD/PELV WO CM dated 12/05/2013  FINDINGS: A single spot intraoperative radiographic images of the right upper quadrant during ERCP is provided for review.  Image demonstrates an ERCP probe overlying the right upper abdominal quadrant with selective cannulation and opacification of the common bile duct which appears nondilated. No discrete filling defects are seen within the common bile duct given obliquity.  There is minimal opacification of the central aspect of the intrahepatic biliary tree which appears nondilated.  There is minimal opacification of the central aspect of the cystic duct and the neck of the gallbladder. The known stones within the neck of the gallbladder and cystic duct are not well demonstrated on present examination secondary to underdistention.  There is no definitive opacification of the  pancreatic duct.  IMPRESSION: ERCP as above.  These images were submitted for radiologic interpretation only. Please see the procedural report for the amount of contrast and the fluoroscopy time utilized.   Electronically Signed   By: Sandi Mariscal M.D.   On: 12/06/2013 16:27   Dg Abd Acute W/chest  12/09/2013   CLINICAL DATA:  Abdominal distension and wheezing.  EXAM: ACUTE ABDOMEN SERIES (ABDOMEN 2 VIEW & CHEST 1 VIEW)  COMPARISON:  DG ABD PORTABLE 1V dated 12/08/2013; CT ABD/PELV WO CM dated 12/05/2013  FINDINGS: Low lung volumes. Cardiac silhouette is enlarged. Areas of linear density project within the left lung base.  Residual contrast is appreciated within the colon. Air is seen within nondilated loops of small bowel. Surgical drain is appreciated within the right upper quadrant. Fusion of the lower lumbar spine is demonstrated. Osseous structures otherwise unremarkable.  IMPRESSION: Likely atelectasis left lung base and infiltrate cannot be excluded.  Nonobstructive bowel gas pattern with a residua of radiopaque contrast. Surgical drain right upper quadrant.   Electronically Signed   By: Margaree Mackintosh M.D.   On: 12/09/2013 09:41   Dg Abd Portable 1v  12/08/2013   CLINICAL DATA:  80 year old male status post procedure with possible ileus. Initial encounter.  EXAM: PORTABLE ABDOMEN - 1 VIEW  COMPARISON:  ERCP images 330 15.  CT Abdomen and Pelvis 12/05/2013.  FINDINGS: Portable spine view of the abdomen at 1100 hrs. Contrast in nondilated colon. Two catheters project over the right abdomen, possibly postoperative drains. Mild gaseous distension of the body of the stomach. Paucity of small bowel gas. No other increased or dilated loops identified. Stable visualized osseous structures.  IMPRESSION: Non obstructed bowel gas pattern with stable mild gaseous distension of the stomach.   Electronically Signed   By: Lars Pinks M.D.   On: 12/08/2013 12:19    Microbiology: Recent Results (from the past 240 hour(s))   MRSA PCR SCREENING     Status: None   Collection Time    12/05/13  8:30 PM      Result Value Ref Range Status   MRSA by PCR NEGATIVE  NEGATIVE Final   Comment:            The GeneXpert MRSA Assay (FDA  approved for NASAL specimens     only), is one component of a     comprehensive MRSA colonization     surveillance program. It is not     intended to diagnose MRSA     infection nor to guide or     monitor treatment for     MRSA infections.  CULTURE, BLOOD (ROUTINE X 2)     Status: None   Collection Time    12/05/13  9:00 PM      Result Value Ref Range Status   Specimen Description BLOOD LEFT ARM   Final   Special Requests     Final   Value: BOTTLES DRAWN AEROBIC AND ANAEROBIC 10CC AEROBIC 8CC ANAEROBIC   Culture  Setup Time     Final   Value: 12/06/2013 01:37     Performed at [facility]   Culture     Final   Value: NO GROWTH 5 DAYS     Performed at [facility]   Report Status 12/12/2013 FINAL   Final  CULTURE, BLOOD (ROUTINE X 2)     Status: None   Collection Time    12/05/13  9:30 PM      Result Value Ref Range Status   Specimen Description BLOOD LEFT HAND   Final   Special Requests BOTTLES DRAWN AEROBIC ONLY 5CC   Final   Culture  Setup Time     Final   Value: 12/06/2013 01:36     Performed at [facility]   Culture     Final   Value: NO GROWTH 5 DAYS     Performed at [facility]   Report Status 12/12/2013 FINAL   Final  URINE CULTURE     Status: None   Collection Time    12/06/13  2:24 AM      Result Value Ref Range Status   Specimen Description URINE, CLEAN CATCH   Final   Special Requests NONE   Final   Culture  Setup Time     Final   Value: 12/06/2013 09:13     Performed at [place]     Final   Value: 1,000 COLONIES/ML     Performed at [facility]   Culture     Final   Value: INSIGNIFICANT GROWTH     Performed at [facility]   Report Status 12/07/2013 FINAL   Final      Labs: Basic Metabolic Panel:  Recent Labs Lab 12/09/13 0554 12/11/13 1016 12/13/13 0640 12/14/13 0505  NA 140 141 138 141  K 4.5 3.7 3.2* 3.7  CL 109 108 104 105  CO2 16* 19 22 22   GLUCOSE 115* 168* 110* 108*  BUN 41* 26* 16 13  CREATININE 1.33 1.03 0.97 0.93  CALCIUM 8.3* 8.3* 8.2* 8.2*   Liver Function Tests:  Recent Labs Lab 12/09/13 0554 12/11/13 1016 12/13/13 0640  AST 20 16 14   ALT 22 15 13   ALKPHOS 124* 127* 107  BILITOT 1.2 0.9 0.9  PROT 5.8* 5.8* 6.1  ALBUMIN 1.6* 1.5* 1.7*   No results found for this basename: LIPASE, AMYLASE,  in the last 168 hours No results found for this basename: AMMONIA,  in the last 168 hours CBC:  Recent Labs Lab 12/09/13 0554 12/11/13 1016 12/13/13 0640 12/14/13 0505  WBC 12.9* 9.4 10.0 10.8*  HGB 9.7* 9.2* 9.2* 9.6*  HCT 28.7* 26.3* 26.4* 28.4*  MCV 96.3 94.6  94.6 95.3  PLT 274 267 284 312   Cardiac Enzymes: No results found for this basename: CKTOTAL, CKMB, CKMBINDEX, TROPONINI,  in the last 168 hours BNP: BNP (last 3 results) No results found for this basename: PROBNP,  in the last 8760 hours CBG:  Recent Labs Lab 12/14/13 0808 12/14/13 1137 12/14/13 1659 12/14/13 2223 12/15/13 0740  GLUCAP 107* 184* 122* 152* 121*       Signed:  Geradine Girt  [facility] 12/15/2013, 9:56 AM

## 2013-12-14 NOTE — Progress Notes (Addendum)
Patient ID: [name] [name], male   DOB: 05-13-1929, 80 y.o.   MRN: [id number] 8 Days Post-Op  Subjective: Pt feels ok just very weak.  Less nausea with Reglan.  Trying compazine today as well  Objective: Vital signs in last 24 hours: Temp:  [98.8 F (37.1 C)-98.9 F (37.2 C)] 98.9 F (37.2 C) (04/07 0514) Pulse Rate:  [66-68] 66 (04/07 0514) Resp:  [18-20] 18 (04/07 0514) BP: (155-181)/(71-77) 155/74 mmHg (04/07 0514) SpO2:  [96 %-98 %] 96 % (04/07 0514) Weight:  [204 lb 2.3 oz (92.6 kg)] 204 lb 2.3 oz (92.6 kg) (04/07 0514) Last BM Date: 12/13/13  Intake/Output from previous day: 04/06 0701 - 04/07 0700 In: 385.7 [P.O.:60; I.V.:225.7; IV Piggyback:100] Out: 1710 [Urine:1600; Drains:110] Intake/Output this shift:    PE: Abd: soft, minimal tenderness, +BS, incision c/d/i, both JP drains with serosang output  Lab Results:   Recent Labs  12/13/13 0640 12/14/13 0505  WBC 10.0 10.8*  HGB 9.2* 9.6*  HCT 26.4* 28.4*  PLT 284 312   BMET  Recent Labs  12/13/13 0640 12/14/13 0505  NA 138 141  K 3.2* 3.7  CL 104 105  CO2 22 22  GLUCOSE 110* 108*  BUN 16 13  CREATININE 0.97 0.93  CALCIUM 8.2* 8.2*   PT/INR No results found for this basename: LABPROT, INR,  in the last 72 hours CMP     Component Value Date/Time   NA 141 12/14/2013 0505   K 3.7 12/14/2013 0505   CL 105 12/14/2013 0505   CO2 22 12/14/2013 0505   GLUCOSE 108* 12/14/2013 0505   BUN 13 12/14/2013 0505   CREATININE 0.93 12/14/2013 0505   CALCIUM 8.2* 12/14/2013 0505   PROT 6.1 12/13/2013 0640   ALBUMIN 1.7* 12/13/2013 0640   AST 14 12/13/2013 0640   ALT 13 12/13/2013 0640   ALKPHOS 107 12/13/2013 0640   BILITOT 0.9 12/13/2013 0640   GFRNONAA 76* 12/14/2013 0505   GFRAA 88* 12/14/2013 0505   Lipase     Component Value Date/Time   LIPASE 15 12/06/2013 0315       Studies/Results: No results found.  Anti-infectives: Anti-infectives   Start     Dose/Rate Route Frequency Ordered Stop   12/06/13 1200   piperacillin-tazobactam (ZOSYN) IVPB 3.375 g  Status:  Discontinued     3.375 g 100 mL/hr over 30 Minutes Intravenous 4 times per day 12/06/13 0924 12/06/13 0933   12/06/13 0945  piperacillin-tazobactam (ZOSYN) IVPB 3.375 g     3.375 g 12.5 mL/hr over 240 Minutes Intravenous 3 times per day 12/06/13 0935     12/05/13 1900  vancomycin (VANCOCIN) IVPB 1000 mg/200 mL premix     1,000 mg 200 mL/hr over 60 Minutes Intravenous  Once 12/05/13 1849 12/05/13 2038   12/05/13 1815  piperacillin-tazobactam (ZOSYN) IVPB 3.375 g     3.375 g 100 mL/hr over 30 Minutes Intravenous  Once 12/05/13 1808 12/05/13 1942       Assessment/Plan  1. POD 8, s/p diagnostic laparoscopy with drain placement and ERCP   Plan: 1. Patient is surgically stable.  Nausea may be a chronic thing for him right now.  It is better with Reglan before his meals though.  His drains will need to remain in place indefinitely. 2. Zosyn D9/10.  I have changed this to oral augmentin D1/2.  This has an automatic stop date tomorrow. 3. Plan for SNF.  [place] for dc to SNF when patient if/when ok with medicine. 4.  Follow up with Dr. Donne Hazel in 2 weeks  LOS: 9 days    Makennah Omura E 12/14/2013, 9:31 AM Pager: [phone]  ADDENDUM: Dr. Donne Hazel informed me to pull the lateral JP drain prior to dc.  This was done with no complications.

## 2013-12-14 NOTE — Progress Notes (Signed)
Physical Therapy Treatment Patient Details Name: [name] [name] MRN: [id number] DOB: 1929-07-27 Today's Date: 12/14/2013    History of Present Illness Patient is an 80 y/o male with PMH of 2 DM, CAD s/p CABG, HLD presenting with cholecystitis and sepsis.  Now s/p ERCP with sphincterotomy/papillotomy and ERCP with removal of calculus/calculi and drainage of right upper quadrant abcess.    PT Comments    Patient able to tolerate gait training today with increased distance and required no rest breaks. Patient motivated to leave hospital. Patient will benefit from SNF to increase safety and endurance with activity.   Follow Up Recommendations  SNF     Equipment Recommendations  None recommended by PT    Recommendations for Other Services       Precautions / Restrictions Precautions Precautions: Fall Restrictions Weight Bearing Restrictions: No    Mobility  Bed Mobility Overal bed mobility: Needs Assistance Bed Mobility: Sit to Supine;Sit to Sidelying       Sit to supine: Min assist Sit to sidelying: Min assist General bed mobility comments: Assist to reposition LE and required cues for hand placement on rails with bed mobility.   Transfers Overall transfer level: Needs assistance Equipment used: Rolling walker (2 wheeled) Transfers: Sit to/from Stand Sit to Stand: Min assist         General transfer comment: assist for stability upon standing  Ambulation/Gait Ambulation/Gait assistance: Min assist Ambulation Distance (Feet): 300 Feet Assistive device: Rolling walker (2 wheeled)   Gait velocity: decreased   General Gait Details: Patient required cues for upright posture. Patient was able to tolerate increased distance without rest    Stairs            Wheelchair Mobility    Modified Rankin (Stroke Patients Only)       Balance Overall balance assessment: Needs assistance         Standing balance support: Bilateral upper extremity  supported Standing balance-Leahy Scale: Poor Standing balance comment: Patient relies on UE support during standing balance                    Cognition Arousal/Alertness: Awake/alert Behavior During Therapy: WFL for tasks assessed/performed Overall Cognitive Status: Within Functional Limits for tasks assessed                      Exercises      General Comments        Pertinent Vitals/Pain Patient denies pain. Patient c/o increased nausea today. Nurse was notified and came into room to adm medication.     Home Living                      Prior Function            PT Goals (current goals can now be found in the care plan section) Progress towards PT goals: Progressing toward goals    Frequency  Min 3X/week    PT Plan Current plan remains appropriate    Co-evaluation             End of Session Equipment Utilized During Treatment: Gait belt Activity Tolerance: Patient limited by fatigue;Patient tolerated treatment well (Patient fatigue after gait training) Patient left: in bed;with call bell/phone within reach;with bed alarm set;with family/visitor present     Time: 1431-1457 PT Time Calculation (min): 26 min  Charges:  $Gait Training: 23-37 mins  G Codes:      Bhavana Kady, SPTA 12/14/2013, 3:13 PM

## 2013-12-15 ENCOUNTER — Telehealth: Payer: Self-pay | Admitting: Family Medicine

## 2013-12-15 DIAGNOSIS — M6281 Muscle weakness (generalized): Secondary | ICD-10-CM | POA: Diagnosis not present

## 2013-12-15 DIAGNOSIS — K651 Peritoneal abscess: Secondary | ICD-10-CM | POA: Diagnosis not present

## 2013-12-15 DIAGNOSIS — I959 Hypotension, unspecified: Secondary | ICD-10-CM | POA: Diagnosis not present

## 2013-12-15 DIAGNOSIS — C801 Malignant (primary) neoplasm, unspecified: Secondary | ICD-10-CM | POA: Diagnosis not present

## 2013-12-15 DIAGNOSIS — R1311 Dysphagia, oral phase: Secondary | ICD-10-CM | POA: Diagnosis not present

## 2013-12-15 DIAGNOSIS — N183 Chronic kidney disease, stage 3 unspecified: Secondary | ICD-10-CM | POA: Diagnosis not present

## 2013-12-15 DIAGNOSIS — K8050 Calculus of bile duct without cholangitis or cholecystitis without obstruction: Secondary | ICD-10-CM | POA: Diagnosis not present

## 2013-12-15 DIAGNOSIS — R5381 Other malaise: Secondary | ICD-10-CM | POA: Diagnosis not present

## 2013-12-15 DIAGNOSIS — K819 Cholecystitis, unspecified: Secondary | ICD-10-CM | POA: Diagnosis not present

## 2013-12-15 DIAGNOSIS — R1310 Dysphagia, unspecified: Secondary | ICD-10-CM | POA: Diagnosis not present

## 2013-12-15 DIAGNOSIS — K219 Gastro-esophageal reflux disease without esophagitis: Secondary | ICD-10-CM | POA: Diagnosis not present

## 2013-12-15 DIAGNOSIS — G250 Essential tremor: Secondary | ICD-10-CM | POA: Diagnosis not present

## 2013-12-15 DIAGNOSIS — E785 Hyperlipidemia, unspecified: Secondary | ICD-10-CM | POA: Diagnosis not present

## 2013-12-15 DIAGNOSIS — R262 Difficulty in walking, not elsewhere classified: Secondary | ICD-10-CM | POA: Diagnosis not present

## 2013-12-15 DIAGNOSIS — I4510 Unspecified right bundle-branch block: Secondary | ICD-10-CM | POA: Diagnosis not present

## 2013-12-15 DIAGNOSIS — I2510 Atherosclerotic heart disease of native coronary artery without angina pectoris: Secondary | ICD-10-CM | POA: Diagnosis not present

## 2013-12-15 DIAGNOSIS — E119 Type 2 diabetes mellitus without complications: Secondary | ICD-10-CM | POA: Diagnosis not present

## 2013-12-15 DIAGNOSIS — M109 Gout, unspecified: Secondary | ICD-10-CM | POA: Diagnosis not present

## 2013-12-15 DIAGNOSIS — R279 Unspecified lack of coordination: Secondary | ICD-10-CM | POA: Diagnosis not present

## 2013-12-15 DIAGNOSIS — Z9181 History of falling: Secondary | ICD-10-CM | POA: Diagnosis not present

## 2013-12-15 DIAGNOSIS — A419 Sepsis, unspecified organism: Secondary | ICD-10-CM | POA: Diagnosis not present

## 2013-12-15 DIAGNOSIS — I10 Essential (primary) hypertension: Secondary | ICD-10-CM | POA: Diagnosis not present

## 2013-12-15 DIAGNOSIS — Z48815 Encounter for surgical aftercare following surgery on the digestive system: Secondary | ICD-10-CM | POA: Diagnosis not present

## 2013-12-15 DIAGNOSIS — K5900 Constipation, unspecified: Secondary | ICD-10-CM | POA: Diagnosis not present

## 2013-12-15 DIAGNOSIS — K810 Acute cholecystitis: Secondary | ICD-10-CM | POA: Diagnosis not present

## 2013-12-15 LAB — GLUCOSE, CAPILLARY
GLUCOSE-CAPILLARY: 121 mg/dL — AB (ref 70–99)
Glucose-Capillary: 197 mg/dL — ABNORMAL HIGH (ref 70–99)

## 2013-12-15 NOTE — Discharge Summary (Signed)
Report called to [facility]. Spoke with Christean Leaf. Patient is dressed in paper scrubs and awaiting transport.

## 2013-12-15 NOTE — Progress Notes (Signed)
  Pt to be d/c today to [facility].   Pt and family agreeable. Confirmed plans with facility. Nurse notified to give report.  Plan transfer via EMS at 2:30pm.    [place] 1-16;  6N1-16 Phone: [phone]

## 2013-12-15 NOTE — Progress Notes (Signed)
Patient ID: [name] [name], male   DOB: 28-Mar-1929, 80 y.o.   MRN: [id number] 9 Days Post-Op  Subjective: Pt is stable.  No new complaints  Objective: Vital signs in last 24 hours: Temp:  [98.2 F (36.8 C)-98.5 F (36.9 C)] 98.2 F (36.8 C) (04/08 0559) Pulse Rate:  [66-70] 68 (04/08 0559) Resp:  [17-20] 18 (04/08 0559) BP: (152-157)/(62-68) 152/66 mmHg (04/08 0559) SpO2:  [95 %-97 %] 96 % (04/08 0559) Weight:  [201 lb 11.5 oz (91.5 kg)] 201 lb 11.5 oz (91.5 kg) (04/08 0559) Last BM Date: 12/14/13  Intake/Output from previous day: 04/07 0701 - 04/08 0700 In: 360 [P.O.:360] Out: 1230 [Urine:1150; Drains:80] Intake/Output this shift: Total I/O In: -  Out: 450 [Urine:450]  PE: Abd: soft, JP with serous drainage, +BS, ND, incision c/d/i  Lab Results:   Recent Labs  12/13/13 0640 12/14/13 0505  WBC 10.0 10.8*  HGB 9.2* 9.6*  HCT 26.4* 28.4*  PLT 284 312   BMET  Recent Labs  12/13/13 0640 12/14/13 0505  NA 138 141  K 3.2* 3.7  CL 104 105  CO2 22 22  GLUCOSE 110* 108*  BUN 16 13  CREATININE 0.97 0.93  CALCIUM 8.2* 8.2*   PT/INR No results found for this basename: LABPROT, INR,  in the last 72 hours CMP     Component Value Date/Time   NA 141 12/14/2013 0505   K 3.7 12/14/2013 0505   CL 105 12/14/2013 0505   CO2 22 12/14/2013 0505   GLUCOSE 108* 12/14/2013 0505   BUN 13 12/14/2013 0505   CREATININE 0.93 12/14/2013 0505   CALCIUM 8.2* 12/14/2013 0505   PROT 6.1 12/13/2013 0640   ALBUMIN 1.7* 12/13/2013 0640   AST 14 12/13/2013 0640   ALT 13 12/13/2013 0640   ALKPHOS 107 12/13/2013 0640   BILITOT 0.9 12/13/2013 0640   GFRNONAA 76* 12/14/2013 0505   GFRAA 88* 12/14/2013 0505   Lipase     Component Value Date/Time   LIPASE 15 12/06/2013 0315       Studies/Results: No results found.  Anti-infectives: Anti-infectives   Start     Dose/Rate Route Frequency Ordered Stop   12/14/13 1000  amoxicillin-clavulanate (AUGMENTIN) 875-125 MG per tablet 1 tablet     1 tablet Oral  Every 12 hours 12/14/13 0934 12/16/13 0959   12/14/13 0000  amoxicillin-clavulanate (AUGMENTIN) 875-125 MG per tablet     1 tablet Oral Every 12 hours 12/14/13 1059     12/06/13 1200  piperacillin-tazobactam (ZOSYN) IVPB 3.375 g  Status:  Discontinued     3.375 g 100 mL/hr over 30 Minutes Intravenous 4 times per day 12/06/13 0924 12/06/13 0933   12/06/13 0945  piperacillin-tazobactam (ZOSYN) IVPB 3.375 g  Status:  Discontinued     3.375 g 12.5 mL/hr over 240 Minutes Intravenous 3 times per day 12/06/13 0935 12/14/13 0934   12/05/13 1900  vancomycin (VANCOCIN) IVPB 1000 mg/200 mL premix     1,000 mg 200 mL/hr over 60 Minutes Intravenous  Once 12/05/13 1849 12/05/13 2038   12/05/13 1815  piperacillin-tazobactam (ZOSYN) IVPB 3.375 g     3.375 g 100 mL/hr over 30 Minutes Intravenous  Once 12/05/13 1808 12/05/13 1942       Assessment/Plan   1. POD 9, s/p diagnostic laparoscopy with drain placement and ERCP  Plan: 1. Awaiting bed at SNF 2. D2/2 Augmentin.  He has completed his course today. 3. Follow up with Dr. Donne Hazel on Monday, already arranged  LOS:  10 days    Henreitta Cea 12/15/2013, 9:31 AM Pager: [phone]

## 2013-12-15 NOTE — Progress Notes (Signed)
Progress Note  CUYLER VANDYKEN ENI:[id number] DOB: Dec 29, 1928 DOA: 12/05/2013 PCP: Elsie Stain, MD  Time spent : 56mins  Brief narrative: 80 year old male patient with multiple medical problems including coronary artery disease and diabetes. Presented to the hospital with complaints of significant right upper quadrant pain for 2 weeks. This was associated with nausea and weakness. He apparently had been seen by his primary care provider 2 weeks prior for similar symptoms and was treated for musculoskeletal strain. No apparent fevers or chills at home. Since that time he has had progression in his symptoms and increased anorexia. He also now has right shoulder pain. Since that initial visit he has developed bilious vomiting and has fallen at least twice at home.  Upon evaluation in the ER he was found to have a white count of 42,000. CT of the abdomen and pelvis revealed an enlarged gallbladder with calcified gallstones and stranding concerning for acute cholecystitis. There is possibly a tiny calcified stone in the proximal bile duct. He also was noted to have acute renal failure w/ creatinine of 2.35 noting his baseline creatinine 1.2 back in February. He had mild transaminitis with an elevated alkaline phosphatase and only mildly elevated total bilirubin. General Surgery was contacted by the ER physician. Given his cardiac history preoperative clearance was recommended. In addition to the above findings patient had very soft blood pressures with systolic in the 61W despite IV fluids.  HPI/Subjective: Eating breakfast   Assessment/Plan: AWAITING SNF BED  Acute cholecystitis/right upper quadrant abscess/Choledocholithiasis -s/p attempted lap chole - due to perforated GB underwent diag laparoscopy with drainage of RUQ abscess -follow Blake drains x 2- plan is eventual cholecystectomy  -Continue empiric antibiotics - WBCs cont to trend down ERCP with stone retrieval + sphincterotomy by Dr.  Ardis Hughs  Sepsis Resolved  Wheezing/dyspnea/possible HCAP: wheeze better. Change HHN to PRN.   Nausea: No vomiting. Slightly improved on scheduled reglan  CAD  Acute renal failure on CKD (chronic kidney disease), stage III Resolving  HYPOTHYROIDISM, BORDERLINE -Was not on medications prior to admission  DIABETES MELLITUS, TYPE II Controled  HYPERTENSION Blood pressure increasing. Will resume ACE inhibitor at lower dose now that creatinine normalizing.  Constipation -resolved  DVT prophylaxis: SCd Code Status: Full Family Communication: patient Disposition Plan/Expected LOS: SNF when stable  Consultants: Gen. Surgery Gastroenterology Cardiology IR  Procedures: ERCP   Exploratory Laparoscopy w/ drainage placement x2  Antibiotics: Zosyn 3/29 >>> Vancomycin 3/29    Objective: Blood pressure 152/66, pulse 68, temperature 98.2 F (36.8 C), temperature source Oral, resp. rate 18, height 5\' 11"  (1.803 m), weight 91.5 kg (201 lb 11.5 oz), SpO2 96.00%.  Intake/Output Summary (Last 24 hours) at 12/15/13 0957 Last data filed at 12/15/13 0811  Gross per 24 hour  Intake    360 ml  Output   1330 ml  Net   -970 ml   Exam: General: Appears comfortable Lungs: Good air movement. CTA without WRR Cardiovascular: Regular rate and rhythm without murmur gallop or rub normal S1 and S2,  Abdomen: Distended. Soft. Decreased BM Musculoskeletal: No significant cyanosis, clubbing of bilateral lower extremities  Scheduled Meds:  Scheduled Meds: . amoxicillin-clavulanate  1 tablet Oral Q12H  . carvedilol  20 mg Oral Daily  . enoxaparin (LOVENOX) injection  40 mg Subcutaneous Q24H  . feeding supplement (ENSURE COMPLETE)  237 mL Oral BID BM  . insulin aspart  0-5 Units Subcutaneous QHS  . insulin aspart  0-9 Units Subcutaneous TID WC  . lisinopril  10  mg Oral Daily  . sodium phosphate  1 enema Rectal Once   Data Reviewed: Basic Metabolic Panel:  Recent Labs Lab  12/09/13 0554 12/11/13 1016 12/13/13 0640 12/14/13 0505  NA 140 141 138 141  K 4.5 3.7 3.2* 3.7  CL 109 108 104 105  CO2 16* 19 22 22   GLUCOSE 115* 168* 110* 108*  BUN 41* 26* 16 13  CREATININE 1.33 1.03 0.97 0.93  CALCIUM 8.3* 8.3* 8.2* 8.2*   Liver Function Tests:  Recent Labs Lab 12/09/13 0554 12/11/13 1016 12/13/13 0640  AST 20 16 14   ALT 22 15 13   ALKPHOS 124* 127* 107  BILITOT 1.2 0.9 0.9  PROT 5.8* 5.8* 6.1  ALBUMIN 1.6* 1.5* 1.7*   No results found for this basename: LIPASE, AMYLASE,  in the last 168 hours CBC:  Recent Labs Lab 12/09/13 0554 12/11/13 1016 12/13/13 0640 12/14/13 0505  WBC 12.9* 9.4 10.0 10.8*  HGB 9.7* 9.2* 9.2* 9.6*  HCT 28.7* 26.3* 26.4* 28.4*  MCV 96.3 94.6 94.6 95.3  PLT 274 267 284 312   CBG:  Recent Labs Lab 12/14/13 0808 12/14/13 1137 12/14/13 1659 12/14/13 2223 12/15/13 0740  GLUCAP 107* 184* 122* 152* 121*    Recent Results (from the past 240 hour(s))  MRSA PCR SCREENING     Status: None   Collection Time    12/05/13  8:30 PM      Result Value Ref Range Status   MRSA by PCR NEGATIVE  NEGATIVE Final   Comment:            The GeneXpert MRSA Assay (FDA     approved for NASAL specimens     only), is one component of a     comprehensive MRSA colonization     surveillance program. It is not     intended to diagnose MRSA     infection nor to guide or     monitor treatment for     MRSA infections.  CULTURE, BLOOD (ROUTINE X 2)     Status: None   Collection Time    12/05/13  9:00 PM      Result Value Ref Range Status   Specimen Description BLOOD LEFT ARM   Final   Special Requests     Final   Value: BOTTLES DRAWN AEROBIC AND ANAEROBIC 10CC AEROBIC 8CC ANAEROBIC   Culture  Setup Time     Final   Value: 12/06/2013 01:37     Performed at [facility]   Culture     Final   Value: NO GROWTH 5 DAYS     Performed at [facility]   Report Status 12/12/2013 FINAL   Final  CULTURE, BLOOD (ROUTINE X 2)      Status: None   Collection Time    12/05/13  9:30 PM      Result Value Ref Range Status   Specimen Description BLOOD LEFT HAND   Final   Special Requests BOTTLES DRAWN AEROBIC ONLY 5CC   Final   Culture  Setup Time     Final   Value: 12/06/2013 01:36     Performed at [facility]   Culture     Final   Value: NO GROWTH 5 DAYS     Performed at [facility]   Report Status 12/12/2013 FINAL   Final  URINE CULTURE     Status: None   Collection Time    12/06/13  2:24 AM  Result Value Ref Range Status   Specimen Description URINE, CLEAN CATCH   Final   Special Requests NONE   Final   Culture  Setup Time     Final   Value: 12/06/2013 09:13     Performed at [facility]     Final   Value: 1,000 COLONIES/ML     Performed at [facility]   Culture     Final   Value: INSIGNIFICANT GROWTH     Performed at [facility]   Report Status 12/07/2013 FINAL   Final     Studies:  CLINICAL DATA: Abdominal distension and wheezing.  EXAM:  ACUTE ABDOMEN SERIES (ABDOMEN 2 VIEW & CHEST 1 VIEW)  COMPARISON: DG ABD PORTABLE 1V dated 12/08/2013; CT ABD/PELV WO CM  dated 12/05/2013  FINDINGS:  Low lung volumes. Cardiac silhouette is enlarged. Areas of linear  density project within the left lung base.  Residual contrast is appreciated within the colon. Air is seen  within nondilated loops of small bowel. Surgical drain is  appreciated within the right upper quadrant. Fusion of the lower  lumbar spine is demonstrated. Osseous structures otherwise  unremarkable.  IMPRESSION:  Likely atelectasis left lung base and infiltrate cannot be excluded.  Nonobstructive bowel gas pattern with a residua of radiopaque  contrast. Surgical drain right upper quadrant.   Eulogio Bear [facility] [phone]  If 7PM-7AM, please contact night-coverage [email or web address] Password [email or web address] 12/15/2013, 9:57 AM   LOS: 10 days

## 2013-12-15 NOTE — Telephone Encounter (Signed)
Called and left message with [facility] staff for patient, wishing him will with post hospital recovery.

## 2013-12-15 NOTE — Progress Notes (Signed)
   CSW spoke with Florentina Jenny at [facility] and Pt is clear to transport to facility after completion of paperwork at 2:00pm.   CSW prepared packet and spoke with the family to confirm.     Pt to transport via [facility]. CSW will inform nurse.    CSW will continue to follow Pt for d/c planning.    [facility]  4N 1-16;  [phone] Phone: [phone]

## 2013-12-16 ENCOUNTER — Non-Acute Institutional Stay (SKILLED_NURSING_FACILITY): Payer: Medicare Other | Admitting: Adult Health

## 2013-12-16 ENCOUNTER — Encounter: Payer: Self-pay | Admitting: Adult Health

## 2013-12-16 DIAGNOSIS — E785 Hyperlipidemia, unspecified: Secondary | ICD-10-CM

## 2013-12-16 DIAGNOSIS — G250 Essential tremor: Secondary | ICD-10-CM

## 2013-12-16 DIAGNOSIS — N183 Chronic kidney disease, stage 3 unspecified: Secondary | ICD-10-CM | POA: Diagnosis not present

## 2013-12-16 DIAGNOSIS — I10 Essential (primary) hypertension: Secondary | ICD-10-CM | POA: Diagnosis not present

## 2013-12-16 DIAGNOSIS — K810 Acute cholecystitis: Secondary | ICD-10-CM | POA: Diagnosis not present

## 2013-12-16 DIAGNOSIS — M109 Gout, unspecified: Secondary | ICD-10-CM

## 2013-12-16 DIAGNOSIS — K651 Peritoneal abscess: Secondary | ICD-10-CM

## 2013-12-16 DIAGNOSIS — K8050 Calculus of bile duct without cholangitis or cholecystitis without obstruction: Secondary | ICD-10-CM | POA: Diagnosis not present

## 2013-12-16 DIAGNOSIS — G252 Other specified forms of tremor: Secondary | ICD-10-CM

## 2013-12-16 LAB — PRIMIDONE AND METABOLITE LEVEL: Primidone, Serum: <2.5 ug/mL (ref 8–12)

## 2013-12-16 NOTE — Progress Notes (Signed)
Patient ID: [name] [name], male   DOB: 1929-05-18, 80 y.o.   MRN: [id number]     [facility]   Allergies  Allergen Reactions  . Sulfonamide Derivatives     REACTION: lethargic      Chief Complaint  Patient presents with  . Hospitalization Follow-up    HPI:  He has been hospitalized for acute cholecystitis; choledocholithiasis and right upper quad abscess; with drain. He will eventually undergo a cholecystectomy.  He underwent an ercp with a biliary sphincterotomy and balloon sweep. He is here for short term rehab and will return back home. He has completed his abt.    Past Medical History  Diagnosis Date  . Diabetes mellitus   . Hypertension 1995  . Hyperlipidemia 12/2005  . Benign essential tremor 2005  . Gout 1995  . GERD (gastroesophageal reflux disease)   . Coronary artery disease 2007    s/p CABG, DR Johnsie Cancel  . Glomerulonephritis 1965    3 months "Rest"  . Prostate cancer 2007    S/P treatment, followed by Urology    Past Surgical History  Procedure Laterality Date  . Cystoscopy  02/15/2004    Mod BPH, moder tribec ?  . Cryotherapy of prostate  09/18/2005    2nd, prostate CA  . Adenosine myoview  01/24/2006    Ischemia by EKG, Cath  . Coronary artery bypass graft  01/31/2006    X 4  . Lumbar fusion  02/2012    lumbar spine for spinal stenosis  . Cataract extraction Left 10/2013  . Ercp N/A 12/06/2013    Procedure: ENDOSCOPIC RETROGRADE CHOLANGIOPANCREATOGRAPHY (ERCP);  Surgeon: Milus Banister, MD;  Location: [facility];  Service: Endoscopy;  Laterality: N/A;  . Cholecystectomy N/A 12/06/2013    Procedure: Diagnostic Laparoscopy for  drainage Intrabdominal abcess;  Surgeon: Rolm Bookbinder, MD;  Location: [facility] OR;  Service: General;  Laterality: N/A;    VITAL SIGNS BP 112/76  Pulse 63  Ht 5\' 11"  (1.803 m)  Wt 190 lb (86.183 kg)  BMI 26.51 kg/m2   Patient's Medications  New Prescriptions   No medications on file  Previous Medications   ALBUTEROL  (PROVENTIL) (2.5 MG/3ML) 0.083% NEBULIZER SOLUTION    Take 3 mLs (2.5 mg total) by nebulization every 6 (six) hours as needed for wheezing or shortness of breath.   ALLOPURINOL (ZYLOPRIM) 300 MG TABLET    Take 300 mg by mouth daily.   ATORVASTATIN (LIPITOR) 40 MG TABLET    Take 40 mg by mouth daily.   CARVEDILOL (COREG CR) 20 MG 24 HR CAPSULE    Take 1 capsule (20 mg total) by mouth daily.   FAMOTIDINE (PEPCID) 20 MG TABLET    Take 1 tablet (20 mg total) by mouth 2 (two) times daily as needed for heartburn.   FEEDING SUPPLEMENT, ENSURE COMPLETE, (ENSURE COMPLETE) LIQD    Take 237 mLs by mouth 2 (two) times daily between meals.   LISINOPRIL (PRINIVIL,ZESTRIL) 40 MG TABLET    Take 40 mg by mouth daily. 1  TAB  EVERY DAY   METOCLOPRAMIDE (REGLAN) 5 MG TABLET    Take 1 tablet (5 mg total) by mouth 4 (four) times daily.   ONDANSETRON (ZOFRAN) 4 MG TABLET    Take 1 tablet (4 mg total) by mouth every 4 (four) hours as needed for nausea.   OXYCODONE (OXY IR/ROXICODONE) 5 MG IMMEDIATE RELEASE TABLET    Take 1-2 tablets (5-10 mg total) by mouth every 4 (four) hours as needed  for moderate pain.   PRIMIDONE (MYSOLINE) 50 MG TABLET    Take 25 mg by mouth daily.   PROCHLORPERAZINE (COMPAZINE) 25 MG SUPPOSITORY    Place 1 suppository (25 mg total) rectally every 12 (twelve) hours as needed for nausea or vomiting.  Modified Medications   Modified Medication Previous Medication   INSULIN ASPART (NOVOLOG) 100 UNIT/ML INJECTION insulin aspart (NOVOLOG) 100 UNIT/ML injection      Inject 5 Units into the skin 3 (three) times daily with meals. For cbg >=150    Inject 0-9 Units into the skin 3 (three) times daily with meals.  Discontinued Medications   AMOXICILLIN-CLAVULANATE (AUGMENTIN) 875-125 MG PER TABLET    Take 1 tablet by mouth every 12 (twelve) hours.   INSULIN ASPART (NOVOLOG) 100 UNIT/ML INJECTION    Inject 0-5 Units into the skin at bedtime.    SIGNIFICANT DIAGNOSTIC EXAMS  12-05-13: ct of abdomen and  pelvis: 1. Hydropic gallbladder with calcified gallstones and mild stranding suspicious for acute cholecystitis. Consider correlation with ultrasound. 2. Possible tiny calcified stone in the proximal common bile duct. 3. Low attenuation in the liver adjacent to the gallbladder fossa may represent hepatic edema in the setting of cholecystitis or focalfatty infiltration.4. Small hiatal hernia. 5. Renal cysts.  12-06-13: chest x-ray: Very low lung volumes with bibasilar atelectasis postoperatively.   12-08-13: kub: Non obstructed bowel gas pattern with stable mild gaseous distension of the stomach.   12-09-13: acute abdomen: Likely atelectasis left lung base and infiltrate cannot be excluded. Nonobstructive bowel gas pattern with a residual of radiopaque contrast. Surgical drain right upper quadrant.     LABS REVIEWED:   12-05-13: wbc 42.8; hgb 13.1; hct 36.8; mcv 96.3; plt 327; glucose 232; bun 22; creat 2.35; k+4.7; na++134; lipase 22; alkphos 270; alt 64; ast 54; t bili 1.5; albumin 2.3 hgb a1c 6.1 12-09-13: wbc 12.9; hgb 9.7; hct 28.7; mcv 96.3; lt 274; glucose 115; bun 11; creat 1.33; k+4.5; na++140 alkphos 124; ast 22; ast 20; albumin 1.6; t bili 1.2 12-14-13: wbc 10.8; hgb 9.6; hct 28.4; mcv 95.3; plt 312; glucose 108; bun 13; creat 0.93; k+3.7; na++141      Review of Systems  Constitutional: Negative for malaise/fatigue.  Respiratory: Negative for cough, sputum production and shortness of breath.   Cardiovascular: Negative for chest pain, palpitations and leg swelling.  Gastrointestinal: Negative for heartburn, abdominal pain and constipation.  Musculoskeletal: Negative for joint pain and myalgias.  Skin: Negative.   Neurological: Negative for weakness.  Psychiatric/Behavioral: Negative for depression. The patient is not nervous/anxious.      Physical Exam  Constitutional: He is oriented to person, place, and time. He appears well-developed and well-nourished. No distress.  Neck: Neck  supple. No JVD present.  Cardiovascular: Normal rate, regular rhythm and intact distal pulses.   Respiratory: Effort normal and breath sounds normal. No respiratory distress. He has no wheezes.  GI: Soft. Bowel sounds are normal. He exhibits no distension. There is no tenderness.  Surgical drain with serous drainage present   Musculoskeletal: Normal range of motion. He exhibits no edema.  Neurological: He is alert and oriented to person, place, and time.  Skin: Skin is warm and dry. He is not diaphoretic.  Incision lines without signs of infection present.   Psychiatric: He has a normal mood and affect.      ASSESSMENT/ PLAN:  1. Cholecystitis with right upper quad abscess: he has completed his abt; will continue to monitor his drain out put and  will continue to monitor for further signs of infection present. He will follow up with surgeon as indicated.   2. Hypertension: will continue lisinopril 40 mg daily and coreg cr 20 mg daily will monitor  3. Gout: no recent flares present will continue allopurinol 300 mg daily and will monitor   4. Dyslipidemia: will continue lipitor 40 mg daily   5. Gerd; is stable will continue reglan 5 mg four times daily; and will continue pepcid 20 mg twice daily as needed  6. Tremor: is stable will continue mysoline 25 mg daily and will monitor  7. CKD: stage III: his renal function remains stable at this time will not make changes will monitor    Will check cbc and cmp next draw and will monitor his status.    Time spent with patient 50 minutes      Ok Edwards NP [facility]  Contact [phone] Monday through Friday 8am- 5pm  After hours call [phone]

## 2013-12-17 LAB — HEPATIC FUNCTION PANEL
ALT: 24 U/L (ref 10–40)
AST: 29 U/L (ref 14–40)
Alkaline Phosphatase: 109 U/L (ref 25–125)
BILIRUBIN, TOTAL: 0.7 mg/dL

## 2013-12-17 LAB — CBC AND DIFFERENTIAL
HEMATOCRIT: 30 % — AB (ref 41–53)
HEMOGLOBIN: 9.4 g/dL — AB (ref 13.5–17.5)
Platelets: 437 10*3/uL — AB (ref 150–399)
WBC: 10.5 10^3/mL

## 2013-12-17 LAB — BASIC METABOLIC PANEL
BUN: 14 mg/dL (ref 4–21)
Creatinine: 0.9 mg/dL (ref 0.6–1.3)
Glucose: 113 mg/dL
Potassium: 3.9 mmol/L (ref 3.4–5.3)
SODIUM: 136 mmol/L — AB (ref 137–147)

## 2013-12-20 ENCOUNTER — Ambulatory Visit (INDEPENDENT_AMBULATORY_CARE_PROVIDER_SITE_OTHER): Payer: Medicare Other | Admitting: General Surgery

## 2013-12-20 ENCOUNTER — Non-Acute Institutional Stay (SKILLED_NURSING_FACILITY): Payer: Medicare Other | Admitting: Internal Medicine

## 2013-12-20 ENCOUNTER — Encounter (INDEPENDENT_AMBULATORY_CARE_PROVIDER_SITE_OTHER): Payer: Medicare Other | Admitting: General Surgery

## 2013-12-20 ENCOUNTER — Encounter (INDEPENDENT_AMBULATORY_CARE_PROVIDER_SITE_OTHER): Payer: Self-pay | Admitting: General Surgery

## 2013-12-20 VITALS — BP 136/78 | HR 78 | Temp 99.5°F | Resp 18 | Ht 71.0 in | Wt 185.0 lb

## 2013-12-20 DIAGNOSIS — E785 Hyperlipidemia, unspecified: Secondary | ICD-10-CM

## 2013-12-20 DIAGNOSIS — I10 Essential (primary) hypertension: Secondary | ICD-10-CM | POA: Diagnosis not present

## 2013-12-20 DIAGNOSIS — M109 Gout, unspecified: Secondary | ICD-10-CM

## 2013-12-20 DIAGNOSIS — Z9889 Other specified postprocedural states: Secondary | ICD-10-CM

## 2013-12-20 DIAGNOSIS — R5381 Other malaise: Secondary | ICD-10-CM | POA: Diagnosis not present

## 2013-12-20 DIAGNOSIS — N183 Chronic kidney disease, stage 3 unspecified: Secondary | ICD-10-CM | POA: Diagnosis not present

## 2013-12-20 DIAGNOSIS — G252 Other specified forms of tremor: Secondary | ICD-10-CM

## 2013-12-20 DIAGNOSIS — K810 Acute cholecystitis: Secondary | ICD-10-CM | POA: Diagnosis not present

## 2013-12-20 DIAGNOSIS — R110 Nausea: Secondary | ICD-10-CM

## 2013-12-20 DIAGNOSIS — G250 Essential tremor: Secondary | ICD-10-CM

## 2013-12-20 NOTE — Progress Notes (Signed)
[name] [name] is a 80 y.o. male who is status post a diagnostic laparoscopy and drainage of RUQ abscess on 3/29.  It appears he underwent a laparoscopic drainage procedure due to a gallbladder perforation. It was felt that it would be unsafe at this time to proceed with cholecystectomy. Drains were placed in the right upper quadrant area including the subhepatic space and the paracolic gutter. The patient was discharged on postop day 10 after a difficult recovery. He remains at a nursing facility for his rehabilitation.  He mainly c/o nausea when trying to eat.  Pt can't remember if he is getting his reglan before meals.    Objective: Filed Vitals:   12/20/13 1547  BP: 136/78  Pulse: 78  Temp: 99.5 F (37.5 C)  Resp: 18    General appearance: alert and cooperative GI: normal findings: soft, non-tender JP: cloudy, sero-sanguineus output  Incision: healing well   Assessment: s/p  Patient Active Problem List   Diagnosis Date Noted  . Right upper quadrant abdominal abscess 12/16/2013  . Wheezing 12/09/2013  . possible HCAP (healthcare-associated pneumonia) 12/09/2013  . Choledocholithiasis 12/06/2013  . Sepsis 12/06/2013  . Hypotension, unspecified 12/06/2013  . CKD (chronic kidney disease), stage III 12/06/2013  . Acute renal failure 12/06/2013  . Acute cholecystitis 12/05/2013  . Vomiting 11/24/2013  . Medicare annual wellness visit, initial 10/22/2013  . Preop cardiovascular exam 08/03/2013  . Colon cancer screening 10/20/2011  . Back pain 10/20/2011  . RBBB (right bundle branch block with left anterior fascicular block) 09/20/2011  . HYPOTHYROIDISM, BORDERLINE 03/23/2009  . UNSPECIFIED VITAMIN D DEFICIENCY 03/23/2009  . TREMOR NEC 12/18/2006  . CAD 12/18/2006  . IMPAIRED RENAL FUNCTION NOS 12/18/2006  . ADENOCARCINOMA, PROSTATE, HX OF 12/18/2006  . HEMATURIA, HX OF 12/18/2006  . HYPERLIPIDEMIA 12/08/2005  . DIABETES MELLITUS, TYPE II 02/07/2005  . GOUT 09/09/1993   . HYPERTENSION 09/09/1993    Plan: Cont rehab.  Cont Reglan QAC.  Cont JP.  RTO with Dr Donne Hazel in 2 weeks.    Rosario Adie, [facility], [place]   12/20/2013 3:49 PM

## 2013-12-21 ENCOUNTER — Encounter: Payer: Self-pay | Admitting: Internal Medicine

## 2013-12-21 NOTE — Progress Notes (Signed)
Patient ID: [name] [name], male   DOB: 11-14-1928, 80 y.o.   MRN: [id number]    Facility: [facility]   Chief Complaint  Patient presents with  . Hospitalization Follow-up    new admit   Allergies  Allergen Reactions  . Sulfonamide Derivatives     REACTION: lethargic   HPI 80 y/o male patient is here for STR after hospital admission with acute cholecystitis, choledocholithiasis and right upper quad abscess. He underwent diagnostic laproscopy with drainage of the abscess due to gallbladder perforation. Drains were placed and plan is to have him undergo cholecystectomy. He is seen in his room today. He is in no distress. He has completed his antibiotics.  Review of Systems  Constitutional: Negative for malaise/fatigue and fever Respiratory: Negative for cough, sputum production and shortness of breath.   Cardiovascular: Negative for chest pain, palpitations and leg swelling.  Gastrointestinal: Negative for heartburn, abdominal pain and constipation.  Musculoskeletal: Negative for joint pain and myalgias.  Skin: Negative.   Neurological: has generalized weakness.  Psychiatric/Behavioral: Negative for depression.   Past Medical History  Diagnosis Date  . Hyperlipidemia 12/2005    takes Atorvastatin daily  . Benign essential tremor 2005    takes Primidone daily  . Gout 1995  . Coronary artery disease 2007    s/p CABG, DR Johnsie Cancel  . Prostate cancer 2007    S/P treatment, followed by Urology  . Hypertension 1995    takes Lisinopril and Coreg daily  . Back pain     occasionally  . GERD (gastroesophageal reflux disease)     takes Nexium daily  . Constipation     takes OTC stool softener  . History of colon polyps   . Diabetes mellitus     no meds;diet and exercise controlled    Medication reviewed. See [facility]  Past Surgical History  Procedure Laterality Date  . Cystoscopy  02/15/2004    Mod BPH, moder tribec ?  . Cryotherapy of prostate   09/18/2005    2nd, prostate CA  . Adenosine myoview  01/24/2006    Ischemia by EKG, Cath  . Lumbar fusion  02/2012    lumbar spine for spinal stenosis  . Cataract extraction Left 10/2013  . Ercp N/A 12/06/2013    Procedure: ENDOSCOPIC RETROGRADE CHOLANGIOPANCREATOGRAPHY (ERCP);  Surgeon: Milus Banister, MD;  Location: [place];  Service: Endoscopy;  Laterality: N/A;  . Cholecystectomy N/A 12/06/2013    Procedure: Diagnostic Laparoscopy for  drainage Intrabdominal abcess;  Surgeon: Rolm Bookbinder, MD;  Location: [facility] OR;  Service: General;  Laterality: N/A;  . Coronary artery bypass graft  2007    X 4  . Colonoscopy     Family History  Problem Relation Age of Onset  . Heart disease Mother   . Cancer Mother   . Diabetes Sister   . Diabetes Sister   . Cancer Brother     prostate  . Prostate cancer Brother   . Hypothyroidism Brother   . Depression Neg Hx   . Alcohol abuse Neg Hx   . Drug abuse Neg Hx   . Stroke Neg Hx   . Colon cancer Neg Hx    History   Social History  . Marital Status: Married    Spouse Name: N/A    Number of Children: 1  . Years of Education: N/A   Occupational History  . Retired Pharmacist, community    Social History Main Topics  . Smoking status: Never  Smoker   . Smokeless tobacco: Never Used  . Alcohol Use: No  . Drug Use: No  . Sexual Activity: Not Currently   Other Topics Concern  . 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    Agent orange exposure   Physical exam BP 128/66  Pulse 71  Temp(Src) 98.8 F (37.1 C)  Resp 18  SpO2 99%  Constitutional: No distress.  Neck: Neck supple. No JVD present.  Cardiovascular: Normal rate, regular rhythm. No leg edema   Respiratory: Effort normal and breath sounds normal. No respiratory distress. He has no wheezes.  GI: Soft. Bowel sounds are normal. He exhibits no distension. Surgical drain in place, serous drainage  present.There is no tenderness.  Musculoskeletal: Normal range of motion. He exhibits no edema.  Neurological: He is alert and oriented to person, place, and time.  Skin: Skin is warm and dry. He is not diaphoretic. Incision line healing well Psychiatric: He has a normal mood and affect.   Labs reviewed 12-05-13: wbc 42.8; hgb 13.1; hct 36.8; mcv 96.3; plt 327; glucose 232; bun 22; creat 2.35; k+4.7; na++134; lipase 22; alkphos 270; alt 64; ast 54; t bili 1.5; albumin 2.3 hgb a1c 6.1 12-09-13: wbc 12.9; hgb 9.7; hct 28.7; mcv 96.3; lt 274; glucose 115; bun 11; creat 1.33; k+4.5; na++140 alkphos 124; ast 22; ast 20; albumin 1.6; t bili 1.2 12-14-13: wbc 10.8; hgb 9.6; hct 28.4; mcv 95.3; plt 312; glucose 108; bun 13; creat 0.93; k+3.7; na++141   Radiological exams 12-05-13: ct of abdomen and pelvis: 1. Hydropic gallbladder with calcified gallstones and mild stranding suspicious for acute cholecystitis. Consider correlation with ultrasound. 2. Possible tiny calcified stone in the proximal common bile duct. 3. Low attenuation in the liver adjacent to the gallbladder fossa may represent hepatic edema in the setting of cholecystitis or focalfatty infiltration.4. Small hiatal hernia. 5. Renal cysts.  12-06-13: chest x-ray: Very low lung volumes with bibasilar atelectasis postoperatively.   12-08-13: kub: Non obstructed bowel gas pattern with stable mild gaseous distension of the stomach.   12-09-13: acute abdomen: Likely atelectasis left lung base and infiltrate cannot be excluded. Nonobstructive bowel gas pattern with a residual of radiopaque contrast. Surgical drain right upper quadrant.   Assessment/plan  Deconditioning Will have patient work with PT/OT as tolerated to regain strength and restore function.  Fall precautions are in place.  Cholecystitis S/p abscess drainage and has completed his antibiotics. Continue to monitor drain output, skin care and monitor for infection signs. Has follow up with  surgery  Nausea Continue premeal reglan and Pepcid for his GERD  Hypertension Stable. continue lisinopril 40 mg daily and coreg cr 20 mg daily   CKD stage 3 Avoid NSAIDs, continue bp meds. Monitor kidney function  hyperlipidemia Continue lipitor for now  Gout no recent flares.continue allopurinol 300 mg daily and will monitor   Tremor continue mysoline 25 mg daily and will monitor  Blanchie Serve, MD  [facility] [phone] (Monday-Friday 8 am - 5 pm) [phone] (afterhours)

## 2013-12-26 ENCOUNTER — Telehealth (INDEPENDENT_AMBULATORY_CARE_PROVIDER_SITE_OTHER): Payer: Self-pay | Admitting: Surgery

## 2013-12-26 NOTE — Telephone Encounter (Signed)
Called by granddaughter due to increasing pain and no po intake. Has perforated GB treated by laparoscopy on 12/05/2013 at [facility].  Recommended trip to [facility] ED for evaluation since he is in more pain. Agreed to proceed.

## 2013-12-29 ENCOUNTER — Other Ambulatory Visit: Payer: Self-pay | Admitting: Family Medicine

## 2013-12-31 ENCOUNTER — Telehealth: Payer: Self-pay

## 2013-12-31 NOTE — Telephone Encounter (Signed)
Pt wants to be removed from auto refill from mail order pharmacy; advised pt he or family member would need to contact mail order pharmacy to let them know to stop auto refill; pt voiced understanding.

## 2014-01-04 ENCOUNTER — Encounter: Payer: Self-pay | Admitting: Adult Health

## 2014-01-04 ENCOUNTER — Non-Acute Institutional Stay (SKILLED_NURSING_FACILITY): Payer: Medicare Other | Admitting: Adult Health

## 2014-01-04 DIAGNOSIS — K8050 Calculus of bile duct without cholangitis or cholecystitis without obstruction: Secondary | ICD-10-CM | POA: Diagnosis not present

## 2014-01-04 DIAGNOSIS — K810 Acute cholecystitis: Secondary | ICD-10-CM | POA: Diagnosis not present

## 2014-01-04 DIAGNOSIS — A419 Sepsis, unspecified organism: Secondary | ICD-10-CM

## 2014-01-04 DIAGNOSIS — K651 Peritoneal abscess: Secondary | ICD-10-CM | POA: Diagnosis not present

## 2014-01-04 NOTE — Progress Notes (Signed)
Patient ID: [name] [name], male   DOB: 1928/12/07, 80 y.o.   MRN: [id number]     [facility]  Allergies  Allergen Reactions  . Sulfonamide Derivatives     REACTION: lethargic     Chief Complaint  Patient presents with  . Discharge Note    HPI:  He is being discharged to home with home health for pt/ot/nursing/aid. He will need a tub bench. He will need his prescriptions to be written. He was admitted to this facility for short term rehab following acute cholecystitis and sepsis.    Past Medical History  Diagnosis Date  . Diabetes mellitus   . Hypertension 1995  . Hyperlipidemia 12/2005  . Benign essential tremor 2005  . Gout 1995  . GERD (gastroesophageal reflux disease)   . Coronary artery disease 2007    s/p CABG, DR Johnsie Cancel  . Glomerulonephritis 1965    3 months "Rest"  . Prostate cancer 2007    S/P treatment, followed by Urology    Past Surgical History  Procedure Laterality Date  . Cystoscopy  02/15/2004    Mod BPH, moder tribec ?  . Cryotherapy of prostate  09/18/2005    2nd, prostate CA  . Adenosine myoview  01/24/2006    Ischemia by EKG, Cath  . Coronary artery bypass graft  01/31/2006    X 4  . Lumbar fusion  02/2012    lumbar spine for spinal stenosis  . Cataract extraction Left 10/2013  . Ercp N/A 12/06/2013    Procedure: ENDOSCOPIC RETROGRADE CHOLANGIOPANCREATOGRAPHY (ERCP);  Surgeon: Milus Banister, MD;  Location: [place];  Service: Endoscopy;  Laterality: N/A;  . Cholecystectomy N/A 12/06/2013    Procedure: Diagnostic Laparoscopy for  drainage Intrabdominal abcess;  Surgeon: Rolm Bookbinder, MD;  Location: [facility] OR;  Service: General;  Laterality: N/A;    VITAL SIGNS BP 124/88  Pulse 77  Ht 5\' 11"  (1.803 m)  Wt 184 lb 6.4 oz (83.643 kg)  BMI 25.73 kg/m2   Patient's Medications  New Prescriptions   No medications on file  Previous Medications   ALLOPURINOL (ZYLOPRIM) 300 MG TABLET    Take 300 mg by mouth daily.   ATORVASTATIN (LIPITOR)  40 MG TABLET    Take 40 mg by mouth daily.   CARVEDILOL (COREG CR) 20 MG 24 HR CAPSULE    Take 1 capsule (20 mg total) by mouth daily.   FAMOTIDINE (PEPCID) 20 MG TABLET    Take 1 tablet (20 mg total) by mouth 2 (two) times daily as needed for heartburn.   FEEDING SUPPLEMENT, ENSURE COMPLETE, (ENSURE COMPLETE) LIQD    Take 237 mLs by mouth 2 (two) times daily between meals.   LISINOPRIL (PRINIVIL,ZESTRIL) 40 MG TABLET    Take 40 mg by mouth daily. 1  TAB  EVERY DAY   METOCLOPRAMIDE (REGLAN) 5 MG TABLET    Take 1 tablet (5 mg total) by mouth 4 (four) times daily.   ONDANSETRON (ZOFRAN) 4 MG TABLET    Take 1 tablet (4 mg total) by mouth every 4 (four) hours as needed for nausea.   OXYCODONE (OXY IR/ROXICODONE) 5 MG IMMEDIATE RELEASE TABLET    Take 1-2 tablets (5-10 mg total) by mouth every 4 (four) hours as needed for moderate pain.   PRIMIDONE (MYSOLINE) 50 MG TABLET    Take 25 mg by mouth daily.   PROCHLORPERAZINE (COMPAZINE) 25 MG SUPPOSITORY    Place 1 suppository (25 mg total) rectally every 12 (twelve) hours as needed  for nausea or vomiting.  Modified Medications   No medications on file  Discontinued Medications   ALBUTEROL (PROVENTIL) (2.5 MG/3ML) 0.083% NEBULIZER SOLUTION    Take 3 mLs (2.5 mg total) by nebulization every 6 (six) hours as needed for wheezing or shortness of breath.   ATORVASTATIN (LIPITOR) 40 MG TABLET    TAKE 1 TABLET DAILY   INSULIN ASPART (NOVOLOG) 100 UNIT/ML INJECTION    Inject 5 Units into the skin 3 (three) times daily with meals. For cbg >=150    SIGNIFICANT DIAGNOSTIC EXAMS  12-05-13: ct of abdomen and pelvis: 1. Hydropic gallbladder with calcified gallstones and mild stranding suspicious for acute cholecystitis. Consider correlation with ultrasound. 2. Possible tiny calcified stone in the proximal common bile duct. 3. Low attenuation in the liver adjacent to the gallbladder fossa may represent hepatic edema in the setting of cholecystitis or focalfatty  infiltration.4. Small hiatal hernia. 5. Renal cysts.  12-06-13: chest x-ray: Very low lung volumes with bibasilar atelectasis postoperatively.   12-08-13: kub: Non obstructed bowel gas pattern with stable mild gaseous distension of the stomach.   12-09-13: acute abdomen: Likely atelectasis left lung base and infiltrate cannot be excluded. Nonobstructive bowel gas pattern with a residual of radiopaque contrast. Surgical drain right upper quadrant.     LABS REVIEWED:   12-05-13: wbc 42.8; hgb 13.1; hct 36.8; mcv 96.3; plt 327; glucose 232; bun 22; creat 2.35; k+4.7; na++134; lipase 22; alkphos 270; alt 64; ast 54; t bili 1.5; albumin 2.3 hgb a1c 6.1 12-09-13: wbc 12.9; hgb 9.7; hct 28.7; mcv 96.3; lt 274; glucose 115; bun 11; creat 1.33; k+4.5; na++140 alkphos 124; ast 22; ast 20; albumin 1.6; t bili 1.2 12-14-13: wbc 10.8; hgb 9.6; hct 28.4; mcv 95.3; plt 312; glucose 108; bun 13; creat 0.93; k+3.7; na++141  12-17-13: wbc 10.5; hgb 9.4; hct 29.8; mcv 97.7; plt 437; glucose 113; bun 14; creat 0.9; k+3.9; na++136; liver normal albumin 2.1  12-24-13: urine culture: e-coli: macrobid      Review of Systems  Constitutional: Negative for malaise/fatigue.  Respiratory: Negative for cough, sputum production and shortness of breath.   Cardiovascular: Negative for chest pain, palpitations and leg swelling.  Gastrointestinal: Negative for heartburn, abdominal pain and constipation.  Musculoskeletal: Negative for joint pain and myalgias.  Skin: Negative.   Neurological: Negative for weakness.  Psychiatric/Behavioral: Negative for depression. The patient is not nervous/anxious.      Physical Exam  Constitutional: He is oriented to person, place, and time. He appears well-developed and well-nourished. No distress.  Neck: Neck supple. No JVD present.  Cardiovascular: Normal rate, regular rhythm and intact distal pulses.   Respiratory: Effort normal and breath sounds normal. No respiratory distress. He has no  wheezes.  GI: Soft. Bowel sounds are normal. He exhibits no distension. There is no tenderness.  Surgical drain with serous drainage present   Musculoskeletal: Normal range of motion. He exhibits no edema.  Neurological: He is alert and oriented to person, place, and time.  Skin: Skin is warm and dry. He is not diaphoretic.   Psychiatric: He has a normal mood and affect.         ASSESSMENT/ PLAN:   Will discharge to home with home health for pt/ot/rn/aid. He will need a tub bench. His prescriptions have been written.    Time spent with patient 40 minutes     Ok Edwards NP [facility]  Contact [phone] Monday through Friday 8am- 5pm  After hours call [phone]

## 2014-01-06 DIAGNOSIS — I2510 Atherosclerotic heart disease of native coronary artery without angina pectoris: Secondary | ICD-10-CM | POA: Diagnosis not present

## 2014-01-06 DIAGNOSIS — Z9181 History of falling: Secondary | ICD-10-CM | POA: Diagnosis not present

## 2014-01-06 DIAGNOSIS — N183 Chronic kidney disease, stage 3 unspecified: Secondary | ICD-10-CM | POA: Diagnosis not present

## 2014-01-06 DIAGNOSIS — E119 Type 2 diabetes mellitus without complications: Secondary | ICD-10-CM | POA: Diagnosis not present

## 2014-01-06 DIAGNOSIS — I959 Hypotension, unspecified: Secondary | ICD-10-CM | POA: Diagnosis not present

## 2014-01-06 DIAGNOSIS — Z951 Presence of aortocoronary bypass graft: Secondary | ICD-10-CM | POA: Diagnosis not present

## 2014-01-06 DIAGNOSIS — I129 Hypertensive chronic kidney disease with stage 1 through stage 4 chronic kidney disease, or unspecified chronic kidney disease: Secondary | ICD-10-CM | POA: Diagnosis not present

## 2014-01-06 DIAGNOSIS — Z48815 Encounter for surgical aftercare following surgery on the digestive system: Secondary | ICD-10-CM | POA: Diagnosis not present

## 2014-01-06 DIAGNOSIS — Z4801 Encounter for change or removal of surgical wound dressing: Secondary | ICD-10-CM | POA: Diagnosis not present

## 2014-01-07 ENCOUNTER — Telehealth (INDEPENDENT_AMBULATORY_CARE_PROVIDER_SITE_OTHER): Payer: Self-pay | Admitting: *Deleted

## 2014-01-07 ENCOUNTER — Encounter (INDEPENDENT_AMBULATORY_CARE_PROVIDER_SITE_OTHER): Payer: Self-pay | Admitting: General Surgery

## 2014-01-07 ENCOUNTER — Ambulatory Visit (INDEPENDENT_AMBULATORY_CARE_PROVIDER_SITE_OTHER): Payer: Medicare Other | Admitting: General Surgery

## 2014-01-07 VITALS — BP 130/80 | HR 78 | Temp 97.3°F | Resp 14 | Ht 71.0 in | Wt 174.0 lb

## 2014-01-07 DIAGNOSIS — K810 Acute cholecystitis: Secondary | ICD-10-CM

## 2014-01-07 DIAGNOSIS — N390 Urinary tract infection, site not specified: Secondary | ICD-10-CM | POA: Diagnosis not present

## 2014-01-07 LAB — COMPREHENSIVE METABOLIC PANEL
ALT: 20 U/L (ref 0–53)
AST: 23 U/L (ref 0–37)
Albumin: 2.6 g/dL — ABNORMAL LOW (ref 3.5–5.2)
Alkaline Phosphatase: 126 U/L — ABNORMAL HIGH (ref 39–117)
BUN: 19 mg/dL (ref 6–23)
CO2: 27 mEq/L (ref 19–32)
Calcium: 9.1 mg/dL (ref 8.4–10.5)
Chloride: 100 mEq/L (ref 96–112)
Creat: 1.1 mg/dL (ref 0.50–1.35)
Glucose, Bld: 162 mg/dL — ABNORMAL HIGH (ref 70–99)
Potassium: 4.8 mEq/L (ref 3.5–5.3)
Sodium: 134 mEq/L — ABNORMAL LOW (ref 135–145)
Total Bilirubin: 0.6 mg/dL (ref 0.2–1.2)
Total Protein: 7.2 g/dL (ref 6.0–8.3)

## 2014-01-07 LAB — CBC WITH DIFFERENTIAL/PLATELET
BASOS ABS: 0.1 10*3/uL (ref 0.0–0.1)
BASOS PCT: 1 % (ref 0–1)
EOS ABS: 0.2 10*3/uL (ref 0.0–0.7)
EOS PCT: 2 % (ref 0–5)
HCT: 32.2 % — ABNORMAL LOW (ref 39.0–52.0)
Hemoglobin: 10.8 g/dL — ABNORMAL LOW (ref 13.0–17.0)
Lymphocytes Relative: 34 % (ref 12–46)
Lymphs Abs: 3.3 10*3/uL (ref 0.7–4.0)
MCH: 30.8 pg (ref 26.0–34.0)
MCHC: 33.5 g/dL (ref 30.0–36.0)
MCV: 91.7 fL (ref 78.0–100.0)
MONO ABS: 0.6 10*3/uL (ref 0.1–1.0)
Monocytes Relative: 6 % (ref 3–12)
NEUTROS ABS: 5.6 10*3/uL (ref 1.7–7.7)
Neutrophils Relative %: 57 % (ref 43–77)
Platelets: 382 10*3/uL (ref 150–400)
RBC: 3.51 MIL/uL — ABNORMAL LOW (ref 4.22–5.81)
RDW: 16.5 % — AB (ref 11.5–15.5)
WBC: 9.8 10*3/uL (ref 4.0–10.5)

## 2014-01-07 NOTE — Telephone Encounter (Signed)
LM for pt to return my call.  Please advise pt of his appt for IR Sinus/Fist Tube Check @ [facility] on 5.5.15 arrive at 10:30 for an 11:00 apt.  Thanks!  Anderson Malta

## 2014-01-07 NOTE — Progress Notes (Signed)
Subjective:     Patient ID: [name] [name], male   DOB: 1929/04/28, 80 y.o.   MRN: [id number]  HPI This is an 80 year old male who presented with gangrenous cholecystitis and a right upper quadrant abscess. I took him to the operating room and attempted to do a cholecystectomy but was really unable to do that. I laparoscopically placed 2 drains. He has had a long hospitalization but returns today doing much better. He is at home. He is eating well with a good appetite. He has no nausea or vomiting. He has no abdominal pain. The drain is putting out small amount of fluid but it does appear somewhat bilious in nature as well. He comes back in today for followup.  Review of Systems     Objective:   Physical Exam Healing incisions without infection, drain with mild amount possibly bilious fluid, nontender abdomen    Assessment:     S/p drainage ruq abscess     Plan:     I will do studies drainage check some labs and see him back again soon. At some point we'll have to discuss a cholecystectomy but I think he needs to be healthier before we begin this.

## 2014-01-08 ENCOUNTER — Other Ambulatory Visit: Payer: Self-pay | Admitting: Family Medicine

## 2014-01-08 LAB — URINALYSIS
BILIRUBIN URINE: NEGATIVE
GLUCOSE, UA: 100 mg/dL — AB
Leukocytes, UA: NEGATIVE
Nitrite: NEGATIVE
Protein, ur: 100 mg/dL — AB
SPECIFIC GRAVITY, URINE: 1.013 (ref 1.005–1.030)
Urobilinogen, UA: 0.2 mg/dL (ref 0.0–1.0)
pH: 6 (ref 5.0–8.0)

## 2014-01-10 NOTE — Telephone Encounter (Signed)
Apparently request originated from pharmacy.  Patient states he is no longer on the medication.

## 2014-01-10 NOTE — Telephone Encounter (Signed)
Electronic refill request. This medication is not on the current meds list nor could I find it in the History.  Please advise.

## 2014-01-10 NOTE — Telephone Encounter (Signed)
It's listed in history. Appears he was off this.  I would deny this for now, please see where the request originated, ie patient or pharmacy.  Thanks.

## 2014-01-11 ENCOUNTER — Telehealth (INDEPENDENT_AMBULATORY_CARE_PROVIDER_SITE_OTHER): Payer: Self-pay

## 2014-01-11 ENCOUNTER — Ambulatory Visit (HOSPITAL_COMMUNITY): Admission: RE | Admit: 2014-01-11 | Payer: Medicare Other | Source: Ambulatory Visit

## 2014-01-11 ENCOUNTER — Other Ambulatory Visit (INDEPENDENT_AMBULATORY_CARE_PROVIDER_SITE_OTHER): Payer: Self-pay | Admitting: General Surgery

## 2014-01-11 DIAGNOSIS — I129 Hypertensive chronic kidney disease with stage 1 through stage 4 chronic kidney disease, or unspecified chronic kidney disease: Secondary | ICD-10-CM | POA: Diagnosis not present

## 2014-01-11 DIAGNOSIS — N183 Chronic kidney disease, stage 3 unspecified: Secondary | ICD-10-CM | POA: Diagnosis not present

## 2014-01-11 DIAGNOSIS — I2510 Atherosclerotic heart disease of native coronary artery without angina pectoris: Secondary | ICD-10-CM | POA: Diagnosis not present

## 2014-01-11 DIAGNOSIS — Z48815 Encounter for surgical aftercare following surgery on the digestive system: Secondary | ICD-10-CM | POA: Diagnosis not present

## 2014-01-11 DIAGNOSIS — E119 Type 2 diabetes mellitus without complications: Secondary | ICD-10-CM | POA: Diagnosis not present

## 2014-01-11 DIAGNOSIS — I959 Hypotension, unspecified: Secondary | ICD-10-CM | POA: Diagnosis not present

## 2014-01-11 NOTE — Telephone Encounter (Signed)
Called pt to see about his drain study getting r/s from today to tomorrow. The pt said he was never told about his appt for the drain study. I apologized for the mistake. I advised him that Dr Donne Hazel did review his labs/ua which were normal per Dr Donne Hazel. I advised pt that I would be back in touch with him once the drain study is completed b/c Dr Donne Hazel will have to advise [place] on the appt. The pt understands.

## 2014-01-12 ENCOUNTER — Encounter (INDEPENDENT_AMBULATORY_CARE_PROVIDER_SITE_OTHER): Payer: Self-pay | Admitting: General Surgery

## 2014-01-12 ENCOUNTER — Ambulatory Visit (HOSPITAL_COMMUNITY)
Admission: RE | Admit: 2014-01-12 | Discharge: 2014-01-12 | Disposition: A | Discharge status: Home / Self Care | Payer: Medicare Other | Source: Ambulatory Visit | Attending: General Surgery | Admitting: General Surgery

## 2014-01-12 ENCOUNTER — Ambulatory Visit (INDEPENDENT_AMBULATORY_CARE_PROVIDER_SITE_OTHER): Payer: Medicare Other | Admitting: General Surgery

## 2014-01-12 VITALS — BP 144/78 | HR 72 | Temp 97.1°F | Resp 16 | Ht 71.0 in | Wt 174.2 lb

## 2014-01-12 DIAGNOSIS — K819 Cholecystitis, unspecified: Secondary | ICD-10-CM | POA: Diagnosis not present

## 2014-01-12 DIAGNOSIS — T85698A Other mechanical complication of other specified internal prosthetic devices, implants and grafts, initial encounter: Secondary | ICD-10-CM | POA: Insufficient documentation

## 2014-01-12 DIAGNOSIS — K810 Acute cholecystitis: Secondary | ICD-10-CM

## 2014-01-12 DIAGNOSIS — Y849 Medical procedure, unspecified as the cause of abnormal reaction of the patient, or of later complication, without mention of misadventure at the time of the procedure: Secondary | ICD-10-CM | POA: Insufficient documentation

## 2014-01-12 DIAGNOSIS — K651 Peritoneal abscess: Secondary | ICD-10-CM | POA: Diagnosis not present

## 2014-01-12 DIAGNOSIS — Z09 Encounter for follow-up examination after completed treatment for conditions other than malignant neoplasm: Secondary | ICD-10-CM

## 2014-01-12 MED ORDER — IOHEXOL 300 MG/ML  SOLN
50.0000 mL | Freq: Once | INTRAMUSCULAR | Status: AC | PRN
  Administered 2014-01-12: 5 mL via INTRAVENOUS

## 2014-01-12 NOTE — Progress Notes (Signed)
Subjective:     Patient ID: [name] [name], male   DOB: 10-21-1928, 80 y.o.   MRN: [id number]  HPI No complaints today, no change since last visit, drain study showed it was occluded, no real output  Review of Systems     Objective:   Physical Exam    abd soft nt drain site clean Assessment:     S/p drainage ruq abscess     Plan:     I removed drain today without difficulty. I will see back in 2 weeks.  Will image again at some point also. Discussed in future when ntn etc are better going to or for cholecystectomy

## 2014-01-13 DIAGNOSIS — I129 Hypertensive chronic kidney disease with stage 1 through stage 4 chronic kidney disease, or unspecified chronic kidney disease: Secondary | ICD-10-CM | POA: Diagnosis not present

## 2014-01-13 DIAGNOSIS — I2510 Atherosclerotic heart disease of native coronary artery without angina pectoris: Secondary | ICD-10-CM | POA: Diagnosis not present

## 2014-01-13 DIAGNOSIS — N183 Chronic kidney disease, stage 3 unspecified: Secondary | ICD-10-CM | POA: Diagnosis not present

## 2014-01-13 DIAGNOSIS — I959 Hypotension, unspecified: Secondary | ICD-10-CM | POA: Diagnosis not present

## 2014-01-13 DIAGNOSIS — Z48815 Encounter for surgical aftercare following surgery on the digestive system: Secondary | ICD-10-CM | POA: Diagnosis not present

## 2014-01-13 DIAGNOSIS — E119 Type 2 diabetes mellitus without complications: Secondary | ICD-10-CM | POA: Diagnosis not present

## 2014-01-16 DIAGNOSIS — Z48815 Encounter for surgical aftercare following surgery on the digestive system: Secondary | ICD-10-CM

## 2014-01-16 DIAGNOSIS — Z48814 Encounter for surgical aftercare following surgery on the teeth or oral cavity: Secondary | ICD-10-CM

## 2014-01-16 DIAGNOSIS — I2510 Atherosclerotic heart disease of native coronary artery without angina pectoris: Secondary | ICD-10-CM | POA: Diagnosis not present

## 2014-01-16 DIAGNOSIS — E119 Type 2 diabetes mellitus without complications: Secondary | ICD-10-CM

## 2014-01-16 DIAGNOSIS — I129 Hypertensive chronic kidney disease with stage 1 through stage 4 chronic kidney disease, or unspecified chronic kidney disease: Secondary | ICD-10-CM | POA: Diagnosis not present

## 2014-01-20 ENCOUNTER — Other Ambulatory Visit: Payer: Self-pay | Admitting: Adult Health

## 2014-01-20 ENCOUNTER — Other Ambulatory Visit: Payer: Self-pay | Admitting: Family Medicine

## 2014-01-20 DIAGNOSIS — I129 Hypertensive chronic kidney disease with stage 1 through stage 4 chronic kidney disease, or unspecified chronic kidney disease: Secondary | ICD-10-CM | POA: Diagnosis not present

## 2014-01-20 DIAGNOSIS — E119 Type 2 diabetes mellitus without complications: Secondary | ICD-10-CM | POA: Diagnosis not present

## 2014-01-20 DIAGNOSIS — I2510 Atherosclerotic heart disease of native coronary artery without angina pectoris: Secondary | ICD-10-CM | POA: Diagnosis not present

## 2014-01-20 DIAGNOSIS — N183 Chronic kidney disease, stage 3 unspecified: Secondary | ICD-10-CM | POA: Diagnosis not present

## 2014-01-20 DIAGNOSIS — Z48815 Encounter for surgical aftercare following surgery on the digestive system: Secondary | ICD-10-CM | POA: Diagnosis not present

## 2014-01-20 DIAGNOSIS — I959 Hypotension, unspecified: Secondary | ICD-10-CM | POA: Diagnosis not present

## 2014-01-21 NOTE — Telephone Encounter (Signed)
Electronic refill request. Last Filled:   20 tablet 0RF on  12/14/2013.  Please advise.

## 2014-01-22 NOTE — Telephone Encounter (Signed)
Sent!

## 2014-01-27 DIAGNOSIS — Z48815 Encounter for surgical aftercare following surgery on the digestive system: Secondary | ICD-10-CM | POA: Diagnosis not present

## 2014-01-27 DIAGNOSIS — I959 Hypotension, unspecified: Secondary | ICD-10-CM | POA: Diagnosis not present

## 2014-01-27 DIAGNOSIS — N183 Chronic kidney disease, stage 3 unspecified: Secondary | ICD-10-CM | POA: Diagnosis not present

## 2014-01-27 DIAGNOSIS — E119 Type 2 diabetes mellitus without complications: Secondary | ICD-10-CM | POA: Diagnosis not present

## 2014-01-27 DIAGNOSIS — I2510 Atherosclerotic heart disease of native coronary artery without angina pectoris: Secondary | ICD-10-CM | POA: Diagnosis not present

## 2014-01-27 DIAGNOSIS — I129 Hypertensive chronic kidney disease with stage 1 through stage 4 chronic kidney disease, or unspecified chronic kidney disease: Secondary | ICD-10-CM | POA: Diagnosis not present

## 2014-02-01 DIAGNOSIS — I129 Hypertensive chronic kidney disease with stage 1 through stage 4 chronic kidney disease, or unspecified chronic kidney disease: Secondary | ICD-10-CM | POA: Diagnosis not present

## 2014-02-01 DIAGNOSIS — N183 Chronic kidney disease, stage 3 unspecified: Secondary | ICD-10-CM | POA: Diagnosis not present

## 2014-02-01 DIAGNOSIS — E119 Type 2 diabetes mellitus without complications: Secondary | ICD-10-CM | POA: Diagnosis not present

## 2014-02-01 DIAGNOSIS — Z48815 Encounter for surgical aftercare following surgery on the digestive system: Secondary | ICD-10-CM | POA: Diagnosis not present

## 2014-02-01 DIAGNOSIS — I2510 Atherosclerotic heart disease of native coronary artery without angina pectoris: Secondary | ICD-10-CM | POA: Diagnosis not present

## 2014-02-01 DIAGNOSIS — I959 Hypotension, unspecified: Secondary | ICD-10-CM | POA: Diagnosis not present

## 2014-02-04 ENCOUNTER — Telehealth: Payer: Self-pay | Admitting: *Deleted

## 2014-02-04 ENCOUNTER — Encounter (INDEPENDENT_AMBULATORY_CARE_PROVIDER_SITE_OTHER): Payer: Self-pay | Admitting: General Surgery

## 2014-02-04 ENCOUNTER — Ambulatory Visit (INDEPENDENT_AMBULATORY_CARE_PROVIDER_SITE_OTHER): Payer: Medicare Other | Admitting: General Surgery

## 2014-02-04 VITALS — BP 124/60 | HR 80 | Temp 97.6°F | Resp 16 | Ht 71.0 in | Wt 173.0 lb

## 2014-02-04 DIAGNOSIS — Z09 Encounter for follow-up examination after completed treatment for conditions other than malignant neoplasm: Secondary | ICD-10-CM

## 2014-02-04 LAB — COMPREHENSIVE METABOLIC PANEL
ALBUMIN: 3.2 g/dL — AB (ref 3.5–5.2)
ALT: 11 U/L (ref 0–53)
AST: 17 U/L (ref 0–37)
Alkaline Phosphatase: 84 U/L (ref 39–117)
BUN: 17 mg/dL (ref 6–23)
CO2: 26 mEq/L (ref 19–32)
Calcium: 9.2 mg/dL (ref 8.4–10.5)
Chloride: 102 mEq/L (ref 96–112)
Creat: 1.16 mg/dL (ref 0.50–1.35)
GLUCOSE: 144 mg/dL — AB (ref 70–99)
POTASSIUM: 5 meq/L (ref 3.5–5.3)
Sodium: 135 mEq/L (ref 135–145)
TOTAL PROTEIN: 6.9 g/dL (ref 6.0–8.3)
Total Bilirubin: 0.7 mg/dL (ref 0.2–1.2)

## 2014-02-04 NOTE — Telephone Encounter (Signed)
Patty advised.  Chong Sicilian says that if the patient refuses again, they will do the discharge without the visit on Monday.

## 2014-02-04 NOTE — Telephone Encounter (Signed)
Please give the order.  Thanks.   

## 2014-02-04 NOTE — Progress Notes (Signed)
Subjective:     Patient ID: [name] [name], male   DOB: 10-08-1928, 80 y.o.   MRN: [id number]  HPI This is an 80 year old male who presented with gangrenous cholecystitis and a right upper quadrant abscess. I took him to the operating room and attempted to do a cholecystectomy but was really unable to do that. I laparoscopically placed 2 drains. He has had a long hospitalization but returns today doing much better. He is at home. He is eating some and using ensure. He has no nausea or vomiting. He has no abdominal pain. I sent him for drain study last time but this was clogged and I ended up removing it.  He returns today doing pretty well. He has some itching.   Review of Systems     Objective:   Physical Exam Abdomen soft, nontender    Assessment:     ruq abscess  cholecystitis    Plan:     I think reasonable to consider proceeding with cholecystectomy soon.  I am going to send him to get a ct scan and then return to discuss surgery.

## 2014-02-04 NOTE — Telephone Encounter (Signed)
Patty from [facility] says the patient was scheduled for a discharge visit today but the patient refused saying that he had an MD appt today.  Patty needs orders saying it's okay to delay the discharge visit until Monday.  Please advise.

## 2014-02-08 DIAGNOSIS — I959 Hypotension, unspecified: Secondary | ICD-10-CM | POA: Diagnosis not present

## 2014-02-08 DIAGNOSIS — E119 Type 2 diabetes mellitus without complications: Secondary | ICD-10-CM | POA: Diagnosis not present

## 2014-02-08 DIAGNOSIS — Z48815 Encounter for surgical aftercare following surgery on the digestive system: Secondary | ICD-10-CM | POA: Diagnosis not present

## 2014-02-08 DIAGNOSIS — I2510 Atherosclerotic heart disease of native coronary artery without angina pectoris: Secondary | ICD-10-CM | POA: Diagnosis not present

## 2014-02-08 DIAGNOSIS — N183 Chronic kidney disease, stage 3 unspecified: Secondary | ICD-10-CM | POA: Diagnosis not present

## 2014-02-08 DIAGNOSIS — I129 Hypertensive chronic kidney disease with stage 1 through stage 4 chronic kidney disease, or unspecified chronic kidney disease: Secondary | ICD-10-CM | POA: Diagnosis not present

## 2014-02-11 ENCOUNTER — Ambulatory Visit
Admission: RE | Admit: 2014-02-11 | Discharge: 2014-02-11 | Disposition: A | Discharge status: Home / Self Care | Payer: Medicare Other | Source: Ambulatory Visit | Attending: General Surgery | Admitting: General Surgery

## 2014-02-11 DIAGNOSIS — K8020 Calculus of gallbladder without cholecystitis without obstruction: Secondary | ICD-10-CM | POA: Diagnosis not present

## 2014-02-11 DIAGNOSIS — Z09 Encounter for follow-up examination after completed treatment for conditions other than malignant neoplasm: Secondary | ICD-10-CM

## 2014-02-11 MED ORDER — IOHEXOL 300 MG/ML  SOLN
100.0000 mL | Freq: Once | INTRAMUSCULAR | Status: AC | PRN
  Administered 2014-02-11: 100 mL via INTRAVENOUS

## 2014-02-16 ENCOUNTER — Telehealth (INDEPENDENT_AMBULATORY_CARE_PROVIDER_SITE_OTHER): Payer: Self-pay

## 2014-02-16 NOTE — Telephone Encounter (Signed)
LMOM for pt to call me so I can let him know that his scan looks pretty good per Dr Donne Hazel. The pt will need to get scheduled for surgery at his next f/u appt which will be this Friday 6/12. Dr Donne Hazel will go over the details at the appt.

## 2014-02-16 NOTE — Telephone Encounter (Signed)
Message copied by Illene Regulus on Wed Feb 16, 2014  5:23 PM ------      Message from: Donne Hazel, MATTHEW      Created: Wed Feb 16, 2014  6:22 AM      Regarding: FW:       His scan looks pretty good and I will need to schedule next time I see him      ----- Message -----         From: Rad Results In Interface         Sent: 02/11/2014   3:23 PM           To: Rolm Bookbinder, MD       ------

## 2014-02-18 ENCOUNTER — Encounter (INDEPENDENT_AMBULATORY_CARE_PROVIDER_SITE_OTHER): Payer: Self-pay | Admitting: General Surgery

## 2014-02-18 ENCOUNTER — Ambulatory Visit (INDEPENDENT_AMBULATORY_CARE_PROVIDER_SITE_OTHER): Payer: Medicare Other | Admitting: General Surgery

## 2014-02-18 VITALS — BP 150/76 | HR 64 | Temp 97.0°F | Resp 18 | Ht 71.0 in | Wt 172.8 lb

## 2014-02-18 DIAGNOSIS — K8020 Calculus of gallbladder without cholecystitis without obstruction: Secondary | ICD-10-CM

## 2014-02-18 NOTE — Progress Notes (Signed)
Patient ID: [name] [name], male   DOB: 1928/11/01, 80 y.o.   MRN: [id number]  Chief Complaint  Patient presents with  . Routine Post Op    reck after GB drain removal    HPI [name] is a 80 y.o. male.   HPI This is an 80 year old male who presented with gangrenous cholecystitis and a right upper quadrant abscess. He underwent ercp to clear his duct I took him to the operating room and attempted to do a cholecystectomy but was really unable to do that. I laparoscopically placed 2 drains. He has had a long hospitalization but returns today again doing much better. He is at home. He is eating more and using ensure. His weight is stable. He appears healthy. He has no nausea or vomiting. He has no abdominal pain. He has done well since drain removal. I did send for ct that was essentially unremarkable.  He comes in today to discuss surgery  Past Medical History  Diagnosis Date  . Diabetes mellitus   . Hypertension 1995  . Hyperlipidemia 12/2005  . Benign essential tremor 2005  . Gout 1995  . GERD (gastroesophageal reflux disease)   . Coronary artery disease 2007    s/p CABG, DR Johnsie Cancel  . Glomerulonephritis 1965    3 months "Rest"  . Prostate cancer 2007    S/P treatment, followed by Urology    Past Surgical History  Procedure Laterality Date  . Cystoscopy  02/15/2004    Mod BPH, moder tribec ?  . Cryotherapy of prostate  09/18/2005    2nd, prostate CA  . Adenosine myoview  01/24/2006    Ischemia by EKG, Cath  . Coronary artery bypass graft  01/31/2006    X 4  . Lumbar fusion  02/2012    lumbar spine for spinal stenosis  . Cataract extraction Left 10/2013  . Ercp N/A 12/06/2013    Procedure: ENDOSCOPIC RETROGRADE CHOLANGIOPANCREATOGRAPHY (ERCP);  Surgeon: Milus Banister, MD;  Location: [facility];  Service: Endoscopy;  Laterality: N/A;  . Cholecystectomy N/A 12/06/2013    Procedure: Diagnostic Laparoscopy for  drainage Intrabdominal abcess;  Surgeon: Rolm Bookbinder,  MD;  Location: [facility];  Service: General;  Laterality: N/A;    Family History  Problem Relation Age of Onset  . Heart disease Mother   . Cancer Mother   . Diabetes Sister   . Diabetes Sister   . Cancer Brother     prostate  . Prostate cancer Brother   . Hypothyroidism Brother   . Depression Neg Hx   . Alcohol abuse Neg Hx   . Drug abuse Neg Hx   . Stroke Neg Hx   . Colon cancer Neg Hx     Social History History  Substance Use Topics  . Smoking status: Never Smoker   . Smokeless tobacco: Never Used  . Alcohol Use: No    Allergies  Allergen Reactions  . Sulfonamide Derivatives     REACTION: lethargic    Current Outpatient Prescriptions  Medication Sig Dispense Refill  . allopurinol (ZYLOPRIM) 300 MG tablet Take 300 mg by mouth daily.      Marland Kitchen atorvastatin (LIPITOR) 40 MG tablet Take 40 mg by mouth daily.      . carvedilol (COREG CR) 20 MG 24 hr capsule Take 1 capsule (20 mg total) by mouth daily.  90 capsule  0  . famotidine (PEPCID) 20 MG tablet Take 1 tablet (20 mg total) by mouth 2 (two) times daily as  needed for heartburn.      . feeding supplement, ENSURE COMPLETE, (ENSURE COMPLETE) LIQD Take 237 mLs by mouth 2 (two) times daily between meals.      Marland Kitchen lisinopril (PRINIVIL,ZESTRIL) 40 MG tablet Take 40 mg by mouth daily. 1  TAB  EVERY DAY      . ondansetron (ZOFRAN) 4 MG tablet TAKE 1 TABLET BY MOUTH EVERY 4 HOURS AS NEEDED  20 tablet  0  . primidone (MYSOLINE) 50 MG tablet Take 25 mg by mouth daily.      . prochlorperazine (COMPAZINE) 25 MG suppository Place 1 suppository (25 mg total) rectally every 12 (twelve) hours as needed for nausea or vomiting.  12 suppository  0   No current facility-administered medications for this visit.    Review of Systems Review of Systems  Constitutional: Negative for fever, chills and unexpected weight change.  HENT: Negative for congestion, hearing loss, sore throat, trouble swallowing and voice change.   Eyes: Negative for visual  disturbance.  Respiratory: Negative for cough and wheezing.   Cardiovascular: Negative for chest pain, palpitations and leg swelling.  Gastrointestinal: Negative for nausea, vomiting, abdominal pain, diarrhea, constipation, blood in stool, abdominal distention, anal bleeding and rectal pain.  Genitourinary: Negative for hematuria and difficulty urinating.  Musculoskeletal: Negative for arthralgias.  Skin: Negative for rash and wound.  Neurological: Negative for seizures, syncope, weakness and headaches.  Hematological: Negative for adenopathy. Does not bruise/bleed easily.  Psychiatric/Behavioral: Negative for confusion.    Blood pressure 150/76, pulse 64, temperature 97 F (36.1 C), temperature source Temporal, resp. rate 18, height 5\' 11"  (1.803 m), weight 172 lb 12.8 oz (78.382 kg).  Physical Exam Physical Exam  Vitals reviewed. Constitutional: He appears well-developed and well-nourished.  Eyes: No scleral icterus.  Cardiovascular: Normal rate, regular rhythm and normal heart sounds.   Pulmonary/Chest: Effort normal and breath sounds normal. He has no wheezes. He has no rales.  Abdominal: Bowel sounds are normal. There is no tenderness.  Lymphadenopathy:    He has no cervical adenopathy.    Data Reviewed EXAM:  CT ABDOMEN AND PELVIS WITH CONTRAST  TECHNIQUE:  Multidetector CT imaging of the abdomen and pelvis was performed  using the standard protocol following bolus administration of  intravenous contrast.  CONTRAST: 164mL OMNIPAQUE IOHEXOL 300 MG/ML SOLN  COMPARISON: 12/05/2013  FINDINGS:  Lung bases are clear.  Liver, spleen, pancreas, and adrenal glands are within normal  limits.  Cholelithiasis (series 4/ image 25). No acute gallbladder wall  thickening or inflammatory changes. No intrahepatic or extrahepatic  ductal dilatation.  Mild postinfectious/inflammatory stranding in the right upper  abdomen, anterior to the gallbladder (series 4/ image 22). No  drainable  fluid collection/abscess.  Left renal cysts, including a dominant 5.6 x 4.9 cm lateral left  lower pole renal cyst. Right kidney is within normal limits. No  hydronephrosis.  No evidence of bowel obstruction. Normal appendix. Colonic  diverticulosis, without associated inflammatory changes.  Atherosclerotic calcifications of the abdominal aorta and branch  vessels.  No abdominopelvic ascites.  No suspicious abdominopelvic lymphadenopathy.  Prostatomegaly.  Bladder is within normal limits.  Degenerative changes of the visualized thoracolumbar spine. Status  post PLIF at L4-5.  IMPRESSION:  Cholelithiasis. No acute gallbladder wall thickening or inflammatory  changes.  Mild postinfectious/inflammatory stranding in the right upper  abdomen, anterior to the gallbladder. No drainable fluid  collection/abscess.   Assessment    History ruq abscess, cholecystitis     Plan    Laparoscopic, possible  open , cholecystectomy     Will need preop cardiac recommendations from Dr Johnsie Cancel.  Once we have those can proceed with surgery. I discussed the procedure in detail with the patient and his family.    We discussed the risks and benefits of a laparoscopic cholecystectomy and possible cholangiogram including, but not limited to bleeding, infection, injury to surrounding structures such as the intestine or liver, bile leak, retained gallstones, need to convert to an open procedure, prolonged diarrhea, blood clots such as  DVT, common bile duct injury, anesthesia risks, and possible need for additional procedures.  I did tell him he had a 20-30% risk of open cholecystectomy. The likelihood of improvement in symptoms and return to the patient's normal status is good. We discussed the typical post-operative recovery course.    Beatrix Breece 02/18/2014, 7:34 PM

## 2014-02-21 ENCOUNTER — Telehealth (INDEPENDENT_AMBULATORY_CARE_PROVIDER_SITE_OTHER): Payer: Self-pay | Admitting: General Surgery

## 2014-02-21 NOTE — Telephone Encounter (Signed)
Spoke with pt and informed him that we received the cardiac clearance from Dr. Johnsie Cancel.  Informed him that per Dr. Johnsie Cancel he needs to continue his beta blocker and BP meds including the morning of surgery.  Explained that I would send around our order forms to our surgery schedulers and that they would call him to set up the surgery date over the phone.  I reiterated that he needs to stop blood thinners 5 days prior to surgery date.  The patient verbalized understanding of all this information.

## 2014-02-22 ENCOUNTER — Other Ambulatory Visit (INDEPENDENT_AMBULATORY_CARE_PROVIDER_SITE_OTHER): Payer: Self-pay | Admitting: General Surgery

## 2014-02-22 ENCOUNTER — Encounter (INDEPENDENT_AMBULATORY_CARE_PROVIDER_SITE_OTHER): Payer: Self-pay

## 2014-02-22 ENCOUNTER — Telehealth: Payer: Self-pay | Admitting: *Deleted

## 2014-02-22 NOTE — Telephone Encounter (Signed)
LEFT  MESSAGE FOR  CALL BACK  MAY PROCEED WITH SURGERY   PR  DR Johnsie Cancel  NOTE  FORWARDED  TO   DR  Donne Hazel  AND  KENDALL MOFFITT./ CY

## 2014-03-08 ENCOUNTER — Other Ambulatory Visit: Payer: Self-pay

## 2014-03-08 NOTE — Telephone Encounter (Signed)
Pt left note; pt request refill carvedilol, lisinopril and nexium to [facility]. Pt also request 15 day refill of Coreg to [facility]; pt only has one pill left. On med list Coreg was last filled # 90 x 0 on 09/21/13. Pt said that had to be wrong because he has been taking his med and has not missed any pills. Pt also said his lisinopril has not been filled by Dr Johnsie Cancel; Finally Pepcid is on med list but pt said presently he is not taking Pepcid or Nexium but pt wants rx for Nexium sent to [facility]. Pt has f/u appt with Dr Damita Dunnings on 04/18/14.Please advise.

## 2014-03-09 ENCOUNTER — Encounter (HOSPITAL_COMMUNITY): Payer: Self-pay | Admitting: Pharmacy Technician

## 2014-03-09 MED ORDER — LISINOPRIL 40 MG PO TABS: 40.0000 mg | ORAL_TABLET | Freq: Every day | ORAL | Status: DC

## 2014-03-09 MED ORDER — ESOMEPRAZOLE MAGNESIUM 20 MG PO CPDR: 20.0000 mg | DELAYED_RELEASE_CAPSULE | Freq: Every day | ORAL | Status: DC

## 2014-03-09 MED ORDER — CARVEDILOL PHOSPHATE ER 20 MG PO CP24: 20.0000 mg | ORAL_CAPSULE | Freq: Every day | ORAL | Status: DC

## 2014-03-09 NOTE — Pre-Procedure Instructions (Signed)
[name] [name]  03/09/2014   Your procedure is scheduled on:  Thurs, July 9 @ 7:30 AM  Report to [facility]  at 5:30 AM.  Call this number if you have problems the morning of surgery: [phone]   Remember:   Do not eat food or drink liquids after midnight.   Take these medicines the morning of surgery with A SIP OF WATER: Allopurinol(Zyloprim),Carvedilol(Coreg),and Nexium(Esomeprazole)               No Goody's,BC's,Aleve,Aspirin,Ibuprofen,Fish Oil,or any Herbal Medications   Do not wear jewelry  Do not wear lotions, powders, or colognes. You may wear deodorant.  Men may shave face and neck.  Do not bring valuables to the hospital.  [facility] is not responsible                  for any belongings or valuables.               Contacts, dentures or bridgework may not be worn into surgery.  Leave suitcase in the car. After surgery it may be brought to your room.  For patients admitted to the hospital, discharge time is determined by your                treatment team.               Patients discharged the day of surgery will not be allowed to drive  home.    Special Instructions:  [facility] - Preparing for Surgery  Before surgery, you can play an important role.  Because skin is not sterile, your skin needs to be as free of germs as possible.  You can reduce the number of germs on you skin by washing with CHG (chlorahexidine gluconate) soap before surgery.  CHG is an antiseptic cleaner which kills germs and bonds with the skin to continue killing germs even after washing.  Please DO NOT use if you have an allergy to CHG or antibacterial soaps.  If your skin becomes reddened/irritated stop using the CHG and inform your nurse when you arrive at Short Stay.  Do not shave (including legs and underarms) for at least 48 hours prior to the first CHG shower.  You may shave your face.  Please follow these instructions carefully:   1.  Shower with CHG Soap the night before  surgery and the                                morning of Surgery.  2.  If you choose to wash your hair, wash your hair first as usual with your       normal shampoo.  3.  After you shampoo, rinse your hair and body thoroughly to remove the                      Shampoo.  4.  Use CHG as you would any other liquid soap.  You can apply chg directly       to the skin and wash gently with scrungie or a clean washcloth.  5.  Apply the CHG Soap to your body ONLY FROM THE NECK DOWN.        Do not use on open wounds or open sores.  Avoid contact with your eyes,       ears, mouth and genitals (private parts).  Wash genitals (private parts)  with your normal soap.  6.  Wash thoroughly, paying special attention to the area where your surgery        will be performed.  7.  Thoroughly rinse your body with warm water from the neck down.  8.  DO NOT shower/wash with your normal soap after using and rinsing off       the CHG Soap.  9.  Pat yourself dry with a clean towel.            10.  Wear clean pajamas.            11.  Place clean sheets on your bed the night of your first shower and do not        sleep with pets.  Day of Surgery  Do not apply any lotions/deoderants the morning of surgery.  Please wear clean clothes to the hospital/surgery center.     Please read over the following fact sheets that you were given: Pain Booklet, Coughing and Deep Breathing and Surgical Site Infection Prevention

## 2014-03-09 NOTE — Telephone Encounter (Signed)
All sent.  Thanks.  

## 2014-03-10 ENCOUNTER — Encounter (HOSPITAL_COMMUNITY)
Admission: RE | Admit: 2014-03-10 | Discharge: 2014-03-10 | Disposition: A | Discharge status: Home / Self Care | Payer: Medicare Other | Source: Ambulatory Visit | Attending: Anesthesiology | Admitting: Anesthesiology

## 2014-03-10 ENCOUNTER — Encounter (HOSPITAL_COMMUNITY): Payer: Self-pay

## 2014-03-10 ENCOUNTER — Encounter (HOSPITAL_COMMUNITY)
Admission: RE | Admit: 2014-03-10 | Discharge: 2014-03-10 | Disposition: A | Discharge status: Home / Self Care | Payer: Medicare Other | Source: Ambulatory Visit | Attending: General Surgery | Admitting: General Surgery

## 2014-03-10 DIAGNOSIS — I10 Essential (primary) hypertension: Secondary | ICD-10-CM | POA: Diagnosis not present

## 2014-03-10 DIAGNOSIS — Z01812 Encounter for preprocedural laboratory examination: Secondary | ICD-10-CM | POA: Insufficient documentation

## 2014-03-10 DIAGNOSIS — Z01818 Encounter for other preprocedural examination: Secondary | ICD-10-CM | POA: Diagnosis not present

## 2014-03-10 HISTORY — DX: Constipation, unspecified: K59.00

## 2014-03-10 HISTORY — DX: Personal history of colonic polyps: Z86.010

## 2014-03-10 HISTORY — DX: Dorsalgia, unspecified: M54.9

## 2014-03-10 HISTORY — DX: Personal history of colon polyps, unspecified: Z86.0100

## 2014-03-10 LAB — BASIC METABOLIC PANEL
ANION GAP: 16 — AB (ref 5–15)
BUN: 24 mg/dL — ABNORMAL HIGH (ref 6–23)
CO2: 21 mEq/L (ref 19–32)
CREATININE: 1.06 mg/dL (ref 0.50–1.35)
Calcium: 9.6 mg/dL (ref 8.4–10.5)
Chloride: 100 mEq/L (ref 96–112)
GFR calc non Af Amer: 63 mL/min — ABNORMAL LOW (ref >90)
GFR, EST AFRICAN AMERICAN: 73 mL/min — AB (ref >90)
Glucose, Bld: 164 mg/dL — ABNORMAL HIGH (ref 70–99)
POTASSIUM: 4.4 meq/L (ref 3.7–5.3)
Sodium: 137 mEq/L (ref 137–147)

## 2014-03-10 LAB — CBC WITH DIFFERENTIAL/PLATELET
BASOS ABS: 0 10*3/uL (ref 0.0–0.1)
BASOS PCT: 0 % (ref 0–1)
Eosinophils Absolute: 0.2 10*3/uL (ref 0.0–0.7)
Eosinophils Relative: 2 % (ref 0–5)
HCT: 37 % — ABNORMAL LOW (ref 39.0–52.0)
Hemoglobin: 12.2 g/dL — ABNORMAL LOW (ref 13.0–17.0)
Lymphocytes Relative: 34 % (ref 12–46)
Lymphs Abs: 3 10*3/uL (ref 0.7–4.0)
MCH: 31.9 pg (ref 26.0–34.0)
MCHC: 33 g/dL (ref 30.0–36.0)
MCV: 96.9 fL (ref 78.0–100.0)
MONOS PCT: 6 % (ref 3–12)
Monocytes Absolute: 0.5 10*3/uL (ref 0.1–1.0)
NEUTROS ABS: 5.1 10*3/uL (ref 1.7–7.7)
NEUTROS PCT: 58 % (ref 43–77)
Platelets: 208 10*3/uL (ref 150–400)
RBC: 3.82 MIL/uL — ABNORMAL LOW (ref 4.22–5.81)
RDW: 15.1 % (ref 11.5–15.5)
WBC: 8.7 10*3/uL (ref 4.0–10.5)

## 2014-03-10 LAB — PROTIME-INR
INR: 1.06 (ref 0.00–1.49)
PROTHROMBIN TIME: 13.8 s (ref 11.6–15.2)

## 2014-03-10 NOTE — Progress Notes (Signed)
03/10/14 1207  OBSTRUCTIVE SLEEP APNEA  Have you ever been diagnosed with sleep apnea through a sleep study? No  Do you snore loudly (loud enough to be heard through closed doors)?  0  Do you often feel tired, fatigued, or sleepy during the daytime? 0  Has anyone observed you stop breathing during your sleep? 0  Do you have, or are you being treated for high blood pressure? 1  BMI more than 35 kg/m2? 0  Age over 78 years old? 1  Neck circumference greater than 40 cm/16 inches? 1 (16)  Gender: 1  Obstructive Sleep Apnea Score 4  Score 4 or greater  Results sent to PCP

## 2014-03-10 NOTE — Progress Notes (Addendum)
Dr.Nishan is cardiologist with last visit in 07/2013  Echo in epic from 2007  Stress test in epic from 2013  Heart cath in epic from 2007  EKG in epic from 12-05-13  Medical Md is Dr.Graham Damita Dunnings   Denies CXR in past yr

## 2014-03-14 NOTE — Progress Notes (Signed)
Anesthesia Chart Review: Patient is a 80 year old male scheduled for laparoscopic, possible open, cholecystectomy on 03/17/14 by Dr. Donne Hazel. He presented on 12/05/13 with gangrenous cholecystitis and RUQ abscess with inability to perform cholecystectomy due to perforated gallbladder, so he underwent drainage of abscess with placement of drains and ERCP with stone retrieval.  Other history includes non-smoker, DM2, HLD, HTN, benign essential tremor, gout, GERD, glomerulonephritis '65, prostate CA, gout, CAD s/p CABG X 4 '07 (LIMA -> LAD, SVG RCA, SVG -> OM1 -> OM2), L4-5 PLIF 02/2012. OSA screening score is 4. PCP is Dr. Elsie Stain. His Cardiologist is Dr. Johnsie Cancel, last visit was 07/2013 although with in-patient cardiology consult by Dr. Marlou Porch on 12/06/13. Dr. Johnsie Cancel was notified for plans for this procedure and cleared patient for surgery.   Nuclear stress test on 01/23/12 showed:  Abnormal stress nuclear study. There is a small, partially reversible apical inferior and true apex perfusion defect suggestive of prior MI with peri-infarction ischemia. This defect looks like more than just apical thinning. LV Ejection Fraction: 62%. LV Wall Motion: NL LV Function; NL Wall Motion.  Ultimately, Dr. Johnsie Cancel cleared him for his lumbar fusion at that time without need for further testing.   Last echo was on 01/29/06 and showed:  - Overall left ventricular systolic function was normal. Left ventricular ejection fraction was estimated to be 60 %. There was focal basal septal hypertrophy. - Aortic valve thickness was mildly to moderately increased. There was lower normal aortic valve leaflet excursion. - Moderate sized anterior echodense region suspect fat pad but can't exclude organized effusion.  His last cath was on 01/27/06 prior to his CABG. (See Notes tab for complete report.)  EKG on 12/05/13 showed NSR, LAD, right BBB.  CXR on 03/10/14 showed: Stable mild cardiomegaly.  No active lung  disease.  Preoperative labs noted.    He has been cleared by his cardiologist.  If no acute changes then I would anticipate that he could proceed as planned.  [facility] Short Stay Center/Anesthesiology Phone [phone] 03/14/2014 12:42 PM

## 2014-03-16 MED ORDER — DEXTROSE 5 % IV SOLN
2.0000 g | INTRAVENOUS | Status: AC
  Administered 2014-03-17: 2 g via INTRAVENOUS
  Filled 2014-03-16: qty 2

## 2014-03-17 ENCOUNTER — Encounter (HOSPITAL_COMMUNITY): Payer: Self-pay | Admitting: Anesthesiology

## 2014-03-17 ENCOUNTER — Encounter (HOSPITAL_COMMUNITY)
Admission: RE | Disposition: A | Discharge status: Skilled Nursing Facility | Payer: Self-pay | Source: Ambulatory Visit | Attending: General Surgery

## 2014-03-17 ENCOUNTER — Inpatient Hospital Stay (HOSPITAL_COMMUNITY): Payer: Medicare Other | Admitting: Anesthesiology

## 2014-03-17 ENCOUNTER — Observation Stay (HOSPITAL_COMMUNITY)
Admission: RE | Admit: 2014-03-17 | Discharge: 2014-03-20 | Disposition: A | Discharge status: Skilled Nursing Facility | Payer: Medicare Other | Source: Ambulatory Visit | Attending: General Surgery | Admitting: General Surgery

## 2014-03-17 ENCOUNTER — Encounter (HOSPITAL_COMMUNITY): Payer: Medicare Other | Admitting: Vascular Surgery

## 2014-03-17 DIAGNOSIS — K219 Gastro-esophageal reflux disease without esophagitis: Secondary | ICD-10-CM | POA: Diagnosis not present

## 2014-03-17 DIAGNOSIS — K660 Peritoneal adhesions (postprocedural) (postinfection): Secondary | ICD-10-CM | POA: Insufficient documentation

## 2014-03-17 DIAGNOSIS — E119 Type 2 diabetes mellitus without complications: Secondary | ICD-10-CM | POA: Diagnosis present

## 2014-03-17 DIAGNOSIS — E785 Hyperlipidemia, unspecified: Secondary | ICD-10-CM | POA: Insufficient documentation

## 2014-03-17 DIAGNOSIS — N183 Chronic kidney disease, stage 3 unspecified: Secondary | ICD-10-CM | POA: Diagnosis not present

## 2014-03-17 DIAGNOSIS — R259 Unspecified abnormal involuntary movements: Secondary | ICD-10-CM | POA: Insufficient documentation

## 2014-03-17 DIAGNOSIS — I10 Essential (primary) hypertension: Secondary | ICD-10-CM | POA: Diagnosis present

## 2014-03-17 DIAGNOSIS — R339 Retention of urine, unspecified: Secondary | ICD-10-CM | POA: Diagnosis not present

## 2014-03-17 DIAGNOSIS — G250 Essential tremor: Secondary | ICD-10-CM | POA: Diagnosis not present

## 2014-03-17 DIAGNOSIS — Z8639 Personal history of other endocrine, nutritional and metabolic disease: Secondary | ICD-10-CM | POA: Diagnosis present

## 2014-03-17 DIAGNOSIS — I2510 Atherosclerotic heart disease of native coronary artery without angina pectoris: Secondary | ICD-10-CM | POA: Diagnosis not present

## 2014-03-17 DIAGNOSIS — K810 Acute cholecystitis: Secondary | ICD-10-CM

## 2014-03-17 DIAGNOSIS — E559 Vitamin D deficiency, unspecified: Secondary | ICD-10-CM | POA: Diagnosis not present

## 2014-03-17 DIAGNOSIS — I129 Hypertensive chronic kidney disease with stage 1 through stage 4 chronic kidney disease, or unspecified chronic kidney disease: Secondary | ICD-10-CM | POA: Diagnosis not present

## 2014-03-17 DIAGNOSIS — Z951 Presence of aortocoronary bypass graft: Secondary | ICD-10-CM | POA: Diagnosis not present

## 2014-03-17 DIAGNOSIS — R338 Other retention of urine: Secondary | ICD-10-CM

## 2014-03-17 DIAGNOSIS — G252 Other specified forms of tremor: Secondary | ICD-10-CM

## 2014-03-17 DIAGNOSIS — Z981 Arthrodesis status: Secondary | ICD-10-CM | POA: Diagnosis not present

## 2014-03-17 DIAGNOSIS — K811 Chronic cholecystitis: Secondary | ICD-10-CM | POA: Diagnosis not present

## 2014-03-17 DIAGNOSIS — I4510 Unspecified right bundle-branch block: Secondary | ICD-10-CM | POA: Insufficient documentation

## 2014-03-17 DIAGNOSIS — M549 Dorsalgia, unspecified: Secondary | ICD-10-CM | POA: Insufficient documentation

## 2014-03-17 DIAGNOSIS — K819 Cholecystitis, unspecified: Secondary | ICD-10-CM | POA: Diagnosis not present

## 2014-03-17 DIAGNOSIS — R062 Wheezing: Secondary | ICD-10-CM | POA: Diagnosis not present

## 2014-03-17 DIAGNOSIS — Z9049 Acquired absence of other specified parts of digestive tract: Secondary | ICD-10-CM

## 2014-03-17 DIAGNOSIS — Z8546 Personal history of malignant neoplasm of prostate: Secondary | ICD-10-CM | POA: Insufficient documentation

## 2014-03-17 DIAGNOSIS — M109 Gout, unspecified: Secondary | ICD-10-CM | POA: Diagnosis not present

## 2014-03-17 HISTORY — DX: Calculus of bile duct without cholangitis or cholecystitis without obstruction: K80.50

## 2014-03-17 HISTORY — DX: Pneumonia, unspecified organism: J18.9

## 2014-03-17 HISTORY — DX: Type 2 diabetes mellitus without complications: E11.9

## 2014-03-17 HISTORY — DX: Unspecified right bundle-branch block: I45.10

## 2014-03-17 HISTORY — DX: Acute kidney failure, unspecified: N17.9

## 2014-03-17 HISTORY — PX: LAPAROSCOPIC CHOLECYSTECTOMY: SUR755

## 2014-03-17 HISTORY — PX: CHOLECYSTECTOMY: SHX55

## 2014-03-17 LAB — CBC
HEMATOCRIT: 34.2 % — AB (ref 39.0–52.0)
Hemoglobin: 11.4 g/dL — ABNORMAL LOW (ref 13.0–17.0)
MCH: 32 pg (ref 26.0–34.0)
MCHC: 33.3 g/dL (ref 30.0–36.0)
MCV: 96.1 fL (ref 78.0–100.0)
Platelets: 188 10*3/uL (ref 150–400)
RBC: 3.56 MIL/uL — ABNORMAL LOW (ref 4.22–5.81)
RDW: 14.5 % (ref 11.5–15.5)
WBC: 9.4 10*3/uL (ref 4.0–10.5)

## 2014-03-17 LAB — GLUCOSE, CAPILLARY
Glucose-Capillary: 108 mg/dL — ABNORMAL HIGH (ref 70–99)
Glucose-Capillary: 157 mg/dL — ABNORMAL HIGH (ref 70–99)

## 2014-03-17 LAB — CREATININE, SERUM
Creatinine, Ser: 1.16 mg/dL (ref 0.50–1.35)
GFR calc non Af Amer: 56 mL/min — ABNORMAL LOW (ref >90)
GFR, EST AFRICAN AMERICAN: 65 mL/min — AB (ref >90)

## 2014-03-17 SURGERY — LAPAROSCOPIC CHOLECYSTECTOMY
Anesthesia: General | Site: Abdomen

## 2014-03-17 MED ORDER — PHENYLEPHRINE HCL 10 MG/ML IJ SOLN
INTRAMUSCULAR | Status: DC | PRN
  Administered 2014-03-17: 60 ug via INTRAVENOUS
  Administered 2014-03-17: 80 ug via INTRAVENOUS
  Administered 2014-03-17: 120 ug via INTRAVENOUS
  Administered 2014-03-17: 80 ug via INTRAVENOUS
  Administered 2014-03-17: 180 ug via INTRAVENOUS

## 2014-03-17 MED ORDER — ONDANSETRON HCL 4 MG/2ML IJ SOLN
4.0000 mg | Freq: Four times a day (QID) | INTRAMUSCULAR | Status: DC | PRN
  Administered 2014-03-17: 4 mg via INTRAVENOUS
  Filled 2014-03-17: qty 2

## 2014-03-17 MED ORDER — SODIUM CHLORIDE 0.9 % IR SOLN
Status: DC | PRN
  Administered 2014-03-17: 3000 mL

## 2014-03-17 MED ORDER — GLYCOPYRROLATE 0.2 MG/ML IJ SOLN
INTRAMUSCULAR | Status: DC | PRN
  Administered 2014-03-17: .6 mg via INTRAVENOUS
  Administered 2014-03-17: .2 mg via INTRAVENOUS

## 2014-03-17 MED ORDER — BUPIVACAINE-EPINEPHRINE 0.25% -1:200000 IJ SOLN
INTRAMUSCULAR | Status: DC | PRN
  Administered 2014-03-17: 30 mL

## 2014-03-17 MED ORDER — NEOSTIGMINE METHYLSULFATE 10 MG/10ML IV SOLN
INTRAVENOUS | Status: DC | PRN
  Administered 2014-03-17: 4 mg via INTRAVENOUS

## 2014-03-17 MED ORDER — LIDOCAINE HCL 4 % MT SOLN
OROMUCOSAL | Status: DC | PRN
  Administered 2014-03-17: 4 mL via TOPICAL

## 2014-03-17 MED ORDER — PHENYLEPHRINE 40 MCG/ML (10ML) SYRINGE FOR IV PUSH (FOR BLOOD PRESSURE SUPPORT)
PREFILLED_SYRINGE | INTRAVENOUS | Status: AC
  Filled 2014-03-17: qty 10

## 2014-03-17 MED ORDER — PROPOFOL 10 MG/ML IV BOLUS
INTRAVENOUS | Status: AC
  Filled 2014-03-17: qty 20

## 2014-03-17 MED ORDER — EPHEDRINE SULFATE 50 MG/ML IJ SOLN
INTRAMUSCULAR | Status: AC
  Filled 2014-03-17: qty 1

## 2014-03-17 MED ORDER — ROCURONIUM BROMIDE 50 MG/5ML IV SOLN
INTRAVENOUS | Status: AC
  Filled 2014-03-17: qty 4

## 2014-03-17 MED ORDER — ARTIFICIAL TEARS OP OINT
TOPICAL_OINTMENT | OPHTHALMIC | Status: AC
  Filled 2014-03-17: qty 3.5

## 2014-03-17 MED ORDER — ROCURONIUM BROMIDE 100 MG/10ML IV SOLN
INTRAVENOUS | Status: DC | PRN
  Administered 2014-03-17 (×2): 10 mg via INTRAVENOUS
  Administered 2014-03-17: 30 mg via INTRAVENOUS
  Administered 2014-03-17: 10 mg via INTRAVENOUS

## 2014-03-17 MED ORDER — BUPIVACAINE-EPINEPHRINE (PF) 0.25% -1:200000 IJ SOLN
INTRAMUSCULAR | Status: AC
  Filled 2014-03-17: qty 30

## 2014-03-17 MED ORDER — ONDANSETRON HCL 4 MG/2ML IJ SOLN: 4.0000 mg | Freq: Once | INTRAMUSCULAR | Status: DC | PRN

## 2014-03-17 MED ORDER — PROPOFOL 10 MG/ML IV BOLUS
INTRAVENOUS | Status: DC | PRN
  Administered 2014-03-17: 130 mg via INTRAVENOUS

## 2014-03-17 MED ORDER — ACETAMINOPHEN 325 MG PO TABS
650.0000 mg | ORAL_TABLET | Freq: Four times a day (QID) | ORAL | Status: DC | PRN
  Administered 2014-03-19: 650 mg via ORAL
  Filled 2014-03-17: qty 2

## 2014-03-17 MED ORDER — SODIUM CHLORIDE 0.9 % IJ SOLN
INTRAMUSCULAR | Status: AC
  Filled 2014-03-17: qty 10

## 2014-03-17 MED ORDER — SODIUM CHLORIDE 0.9 % IV SOLN
INTRAVENOUS | Status: DC
  Administered 2014-03-17 – 2014-03-18 (×3): via INTRAVENOUS

## 2014-03-17 MED ORDER — ACETAMINOPHEN 650 MG RE SUPP: 650.0000 mg | Freq: Four times a day (QID) | RECTAL | Status: DC | PRN

## 2014-03-17 MED ORDER — PANTOPRAZOLE SODIUM 40 MG PO TBEC
40.0000 mg | DELAYED_RELEASE_TABLET | Freq: Every day | ORAL | Status: DC
  Administered 2014-03-18 – 2014-03-20 (×3): 40 mg via ORAL
  Filled 2014-03-17 (×3): qty 1

## 2014-03-17 MED ORDER — FENTANYL CITRATE 0.05 MG/ML IJ SOLN
INTRAMUSCULAR | Status: AC
  Filled 2014-03-17: qty 5

## 2014-03-17 MED ORDER — ROCURONIUM BROMIDE 50 MG/5ML IV SOLN
INTRAVENOUS | Status: AC
  Filled 2014-03-17: qty 1

## 2014-03-17 MED ORDER — LIDOCAINE HCL (CARDIAC) 20 MG/ML IV SOLN
INTRAVENOUS | Status: AC
  Filled 2014-03-17: qty 5

## 2014-03-17 MED ORDER — LIDOCAINE HCL (CARDIAC) 20 MG/ML IV SOLN
INTRAVENOUS | Status: DC | PRN
  Administered 2014-03-17: 60 mg via INTRAVENOUS

## 2014-03-17 MED ORDER — DEXTROSE 5 % IV SOLN
2.0000 g | Freq: Four times a day (QID) | INTRAVENOUS | Status: AC
  Administered 2014-03-17 – 2014-03-18 (×3): 2 g via INTRAVENOUS
  Filled 2014-03-17 (×5): qty 2

## 2014-03-17 MED ORDER — CARVEDILOL PHOSPHATE ER 20 MG PO CP24
20.0000 mg | ORAL_CAPSULE | Freq: Every day | ORAL | Status: DC
  Administered 2014-03-18 – 2014-03-20 (×3): 20 mg via ORAL
  Filled 2014-03-17 (×3): qty 1

## 2014-03-17 MED ORDER — LIDOCAINE HCL (CARDIAC) 20 MG/ML IV SOLN
INTRAVENOUS | Status: AC
  Filled 2014-03-17: qty 20

## 2014-03-17 MED ORDER — HYDROMORPHONE HCL PF 1 MG/ML IJ SOLN
INTRAMUSCULAR | Status: AC
  Administered 2014-03-17: 12:00:00
  Filled 2014-03-17: qty 1

## 2014-03-17 MED ORDER — 0.9 % SODIUM CHLORIDE (POUR BTL) OPTIME
TOPICAL | Status: DC | PRN
  Administered 2014-03-17: 1000 mL

## 2014-03-17 MED ORDER — HEMOSTATIC AGENTS (NO CHARGE) OPTIME
TOPICAL | Status: DC | PRN
  Administered 2014-03-17: 1 via TOPICAL

## 2014-03-17 MED ORDER — ONDANSETRON HCL 4 MG/2ML IJ SOLN
INTRAMUSCULAR | Status: DC | PRN
  Administered 2014-03-17: 4 mg via INTRAVENOUS

## 2014-03-17 MED ORDER — LISINOPRIL 20 MG PO TABS
20.0000 mg | ORAL_TABLET | Freq: Every day | ORAL | Status: DC
  Administered 2014-03-18 – 2014-03-20 (×3): 20 mg via ORAL
  Filled 2014-03-17 (×3): qty 1

## 2014-03-17 MED ORDER — FENTANYL CITRATE 0.05 MG/ML IJ SOLN
INTRAMUSCULAR | Status: DC | PRN
Start: 2014-03-17 — End: 2014-03-17
  Administered 2014-03-17 (×2): 100 ug via INTRAVENOUS
  Administered 2014-03-17: 50 ug via INTRAVENOUS

## 2014-03-17 MED ORDER — MORPHINE SULFATE 2 MG/ML IJ SOLN
2.0000 mg | INTRAMUSCULAR | Status: DC | PRN
  Administered 2014-03-17 – 2014-03-18 (×4): 2 mg via INTRAVENOUS
  Filled 2014-03-17 (×4): qty 1

## 2014-03-17 MED ORDER — LACTATED RINGERS IV SOLN
INTRAVENOUS | Status: DC | PRN
  Administered 2014-03-17 (×2): via INTRAVENOUS

## 2014-03-17 MED ORDER — EPHEDRINE SULFATE 50 MG/ML IJ SOLN
INTRAMUSCULAR | Status: DC | PRN
  Administered 2014-03-17 (×2): 10 mg via INTRAVENOUS

## 2014-03-17 MED ORDER — HYDROMORPHONE HCL PF 1 MG/ML IJ SOLN
0.2500 mg | INTRAMUSCULAR | Status: DC | PRN
  Administered 2014-03-17: 0.25 mg via INTRAVENOUS
  Administered 2014-03-17: 0.5 mg via INTRAVENOUS
  Administered 2014-03-17: 0.25 mg via INTRAVENOUS

## 2014-03-17 MED ORDER — OXYCODONE HCL 5 MG PO TABS
5.0000 mg | ORAL_TABLET | ORAL | Status: DC | PRN
  Administered 2014-03-17 – 2014-03-19 (×4): 5 mg via ORAL
  Filled 2014-03-17 (×4): qty 1

## 2014-03-17 MED ORDER — ENOXAPARIN SODIUM 40 MG/0.4ML ~~LOC~~ SOLN
40.0000 mg | SUBCUTANEOUS | Status: DC
  Administered 2014-03-18 – 2014-03-20 (×3): 40 mg via SUBCUTANEOUS
  Filled 2014-03-17 (×4): qty 0.4

## 2014-03-17 MED ORDER — NEOSTIGMINE METHYLSULFATE 10 MG/10ML IV SOLN
INTRAVENOUS | Status: AC
  Filled 2014-03-17: qty 2

## 2014-03-17 SURGICAL SUPPLY — 50 items
APPLIER CLIP 5 13 M/L LIGAMAX5 (MISCELLANEOUS) ×3
BLADE SURG ROTATE 9660 (MISCELLANEOUS) ×3 IMPLANT
CANISTER SUCTION 2500CC (MISCELLANEOUS) ×3 IMPLANT
CHLORAPREP W/TINT 26ML (MISCELLANEOUS) ×3 IMPLANT
CLIP APPLIE 5 13 M/L LIGAMAX5 (MISCELLANEOUS) ×1 IMPLANT
CLOSURE WOUND 1/2 X4 (GAUZE/BANDAGES/DRESSINGS) ×1
COVER MAYO STAND STRL (DRAPES) IMPLANT
COVER SURGICAL LIGHT HANDLE (MISCELLANEOUS) ×3 IMPLANT
DECANTER SPIKE VIAL GLASS SM (MISCELLANEOUS) ×3 IMPLANT
DERMABOND ADVANCED (GAUZE/BANDAGES/DRESSINGS) ×2
DERMABOND ADVANCED .7 DNX12 (GAUZE/BANDAGES/DRESSINGS) ×1 IMPLANT
DRAIN CHANNEL 19F RND (DRAIN) ×3 IMPLANT
DRAPE C-ARM 42X72 X-RAY (DRAPES) IMPLANT
ELECT REM PT RETURN 9FT ADLT (ELECTROSURGICAL) ×3
ELECTRODE REM PT RTRN 9FT ADLT (ELECTROSURGICAL) ×1 IMPLANT
ENDOLOOP SUT PDS II  0 18 (SUTURE) ×2
ENDOLOOP SUT PDS II 0 18 (SUTURE) ×1 IMPLANT
EVACUATOR SILICONE 100CC (DRAIN) ×3 IMPLANT
GLOVE BIO SURGEON STRL SZ7 (GLOVE) ×6 IMPLANT
GLOVE BIO SURGEON STRL SZ7.5 (GLOVE) ×6 IMPLANT
GLOVE BIOGEL PI IND STRL 7.0 (GLOVE) ×3 IMPLANT
GLOVE BIOGEL PI IND STRL 7.5 (GLOVE) ×3 IMPLANT
GLOVE BIOGEL PI INDICATOR 7.0 (GLOVE) ×6
GLOVE BIOGEL PI INDICATOR 7.5 (GLOVE) ×6
GLOVE SURG SS PI 7.0 STRL IVOR (GLOVE) ×9 IMPLANT
GOWN STRL REUS W/ TWL LRG LVL3 (GOWN DISPOSABLE) ×6 IMPLANT
GOWN STRL REUS W/TWL LRG LVL3 (GOWN DISPOSABLE) ×12
HEMOSTAT SNOW SURGICEL 2X4 (HEMOSTASIS) ×3 IMPLANT
KIT BASIN OR (CUSTOM PROCEDURE TRAY) ×3 IMPLANT
KIT ROOM TURNOVER OR (KITS) ×3 IMPLANT
NS IRRIG 1000ML POUR BTL (IV SOLUTION) ×3 IMPLANT
PAD ARMBOARD 7.5X6 YLW CONV (MISCELLANEOUS) ×3 IMPLANT
POUCH RETRIEVAL ECOSAC 10 (ENDOMECHANICALS) ×1 IMPLANT
POUCH RETRIEVAL ECOSAC 10MM (ENDOMECHANICALS) ×2
SCISSORS LAP 5X35 DISP (ENDOMECHANICALS) ×3 IMPLANT
SET CHOLANGIOGRAPH 5 50 .035 (SET/KITS/TRAYS/PACK) IMPLANT
SET IRRIG TUBING LAPAROSCOPIC (IRRIGATION / IRRIGATOR) ×3 IMPLANT
SLEEVE ENDOPATH XCEL 5M (ENDOMECHANICALS) ×6 IMPLANT
SPECIMEN JAR SMALL (MISCELLANEOUS) ×3 IMPLANT
SPONGE GAUZE 4X4 12PLY (GAUZE/BANDAGES/DRESSINGS) ×3 IMPLANT
STRIP CLOSURE SKIN 1/2X4 (GAUZE/BANDAGES/DRESSINGS) ×2 IMPLANT
SUT ETHILON 2 0 FS 18 (SUTURE) ×3 IMPLANT
SUT MNCRL AB 4-0 PS2 18 (SUTURE) ×3 IMPLANT
SUT VICRYL 0 UR6 27IN ABS (SUTURE) ×3 IMPLANT
TOWEL OR 17X24 6PK STRL BLUE (TOWEL DISPOSABLE) ×6 IMPLANT
TOWEL OR 17X26 10 PK STRL BLUE (TOWEL DISPOSABLE) ×3 IMPLANT
TRAY LAPAROSCOPIC (CUSTOM PROCEDURE TRAY) ×3 IMPLANT
TROCAR XCEL 12X100 BLDLESS (ENDOMECHANICALS) ×3 IMPLANT
TROCAR XCEL BLUNT TIP 100MML (ENDOMECHANICALS) ×3 IMPLANT
TROCAR XCEL NON-BLD 5MMX100MML (ENDOMECHANICALS) ×3 IMPLANT

## 2014-03-17 NOTE — Op Note (Signed)
Preoperative diagnosis: history cholecystitis and ruq abscess s/p laparoscopic drainage Postoperative diagnosis: same as above Procedure: Laparoscopic subtotal cholecystectomy, laparoscopic lysis of adhesions times 45 minutes Surgeon: Dr Serita Grammes Assist: Dr Verita Lamb General anesthesia EBL: 50 cc Drains: 73 Fr Blake to RUQ Specimen: partial gallbladder and stones Sponge and needle count correct Disposition to recovery stable  Indications: This is an 80 year old male who presented several months ago with choledocholithiasis and a right upper quadrant abscess. He underwent an ERCP to clear his duct. I then attempted to do a laparoscopic cholecystectomy. At that operation I found that he had a large right upper quadrant abscess that was adherent to his ribs. Due to the amount of inflammation that he had a really was unable to identify his gallbladder and I elected to drain the abscess and place a drain in his right upper quadrant. It took him a while to recover from this. He eventually recovered and the drains were removed. He is returned more back to his normal self at this point. We did discuss returning to the operating room now in an attempt to remove his gallbladder. We discussed the risks and benefits associated with this.  Procedure: After informed consent was obtained the patient was taken to the operating room. He was given antibiotics. Sequential compression devices were on his legs. He was then placed under general anesthesia without complication. His abdomen was prepped and draped in the standard sterile surgical fashion. A surgical timeout was then performed.  I infiltrated Marcaine below his umbilicus. I reentered the old incision. I entered the fascia sharply and the peritoneum bluntly. I then placed a 0 Vicryl pursestring suture through the fascia. I then inserted a Hassan trocar and insufflated the abdomen to 15 mm mercury pressure. 3 further 5 mm trocars were placed in the  epigastrium and right upper quadrant. Eventually the epigastric trocar was sized up to a 12 mm trocar. Upon entering he had some adhesions to his anterior abdominal wall in the right mid-abdomen. Scissors were used to remove these adhesions. I was barely able to see the top of his gallbladder. He had a lot of adhesions from his liver to his abdominal wall. I spent about 45 minutes lysing adhesions just to be able to visualize the gallbladder as well as the liver. There were a couple small rents made in the liver that were cauterized and made hemostatic. Eventually I was able to identify the gallbladder. I retracted it cephalad. It was very difficult to retract this at all due to the fact that his liver was fixed. I eventually was able to identify the region I thought the triangle was in. I decided to take this from a top down fashion. His gallbladder back wall was fused to his liver and I made a decision to remove the front wall of the gallbladder and leave the back wall on the liver. I took this down to where I thought the triangle was. Eventually with some dissection I was able to identify the cystic artery and clipped and divided this. I did remove all of the stones from the gallbladder and these were taken out and passed off the table. Eventually I went down I was able to identify the cystic duct as well as the common bile duct. There was still a lot of inflammation in this area so I decided to leave a small cuff of gallbladder attached to the cystic duct were administered was safe. I did not really have the ability to do  a cholangiogram either. I did not think opening was going to improve visualization either. I then divided the small cuff of gallbladder. I then placed the gallbladder in a bag and removed it. I then cauterized the entire back wall of the gallbladder as well as any mucosa that was present. I then placed an Endoloop on the stump of gallbladder that had been left in. There were no stones in this  small area. Hemostasis was then observed. I did place a piece of Surgicel so in the liver bed due to its friability. I then placed a 55 [place] Blake drain to this space. I then removed all my trocars. The abdomen had been desufflated. I then tied down my umbilical sutures completely obliterating the defect. I did place an additional 0 Vicryl suture through this. I then closed using 4-0 Monocryl and Dermabond. She was extubated and transferred to recovery stable.

## 2014-03-17 NOTE — H&P (View-Only) (Signed)
Patient ID: [name] [name], male   DOB: 09-11-1928, 80 y.o.   MRN: [id number]  Chief Complaint  Patient presents with  . Routine Post Op    reck after GB drain removal    HPI [name] [name] is a 80 y.o. male.   HPI This is an 80 year old male who presented with gangrenous cholecystitis and a right upper quadrant abscess. He underwent ercp to clear his duct I took him to the operating room and attempted to do a cholecystectomy but was really unable to do that. I laparoscopically placed 2 drains. He has had a long hospitalization but returns today again doing much better. He is at home. He is eating more and using ensure. His weight is stable. He appears healthy. He has no nausea or vomiting. He has no abdominal pain. He has done well since drain removal. I did send for ct that was essentially unremarkable.  He comes in today to discuss surgery  Past Medical History  Diagnosis Date  . Diabetes mellitus   . Hypertension 1995  . Hyperlipidemia 12/2005  . Benign essential tremor 2005  . Gout 1995  . GERD (gastroesophageal reflux disease)   . Coronary artery disease 2007    s/p CABG, DR Johnsie Cancel  . Glomerulonephritis 1965    3 months "Rest"  . Prostate cancer 2007    S/P treatment, followed by Urology    Past Surgical History  Procedure Laterality Date  . Cystoscopy  02/15/2004    Mod BPH, moder tribec ?  . Cryotherapy of prostate  09/18/2005    2nd, prostate CA  . Adenosine myoview  01/24/2006    Ischemia by EKG, Cath  . Coronary artery bypass graft  01/31/2006    X 4  . Lumbar fusion  02/2012    lumbar spine for spinal stenosis  . Cataract extraction Left 10/2013  . Ercp N/A 12/06/2013    Procedure: ENDOSCOPIC RETROGRADE CHOLANGIOPANCREATOGRAPHY (ERCP);  Surgeon: Milus Banister, MD;  Location: [place];  Service: Endoscopy;  Laterality: N/A;  . Cholecystectomy N/A 12/06/2013    Procedure: Diagnostic Laparoscopy for  drainage Intrabdominal abcess;  Surgeon: Rolm Bookbinder,  MD;  Location: [facility] OR;  Service: General;  Laterality: N/A;    Family History  Problem Relation Age of Onset  . Heart disease Mother   . Cancer Mother   . Diabetes Sister   . Diabetes Sister   . Cancer Brother     prostate  . Prostate cancer Brother   . Hypothyroidism Brother   . Depression Neg Hx   . Alcohol abuse Neg Hx   . Drug abuse Neg Hx   . Stroke Neg Hx   . Colon cancer Neg Hx     Social History History  Substance Use Topics  . Smoking status: Never Smoker   . Smokeless tobacco: Never Used  . Alcohol Use: No    Allergies  Allergen Reactions  . Sulfonamide Derivatives     REACTION: lethargic    Current Outpatient Prescriptions  Medication Sig Dispense Refill  . allopurinol (ZYLOPRIM) 300 MG tablet Take 300 mg by mouth daily.      Marland Kitchen atorvastatin (LIPITOR) 40 MG tablet Take 40 mg by mouth daily.      . carvedilol (COREG CR) 20 MG 24 hr capsule Take 1 capsule (20 mg total) by mouth daily.  90 capsule  0  . famotidine (PEPCID) 20 MG tablet Take 1 tablet (20 mg total) by mouth 2 (two) times daily as  needed for heartburn.      . feeding supplement, ENSURE COMPLETE, (ENSURE COMPLETE) LIQD Take 237 mLs by mouth 2 (two) times daily between meals.      Marland Kitchen lisinopril (PRINIVIL,ZESTRIL) 40 MG tablet Take 40 mg by mouth daily. 1  TAB  EVERY DAY      . ondansetron (ZOFRAN) 4 MG tablet TAKE 1 TABLET BY MOUTH EVERY 4 HOURS AS NEEDED  20 tablet  0  . primidone (MYSOLINE) 50 MG tablet Take 25 mg by mouth daily.      . prochlorperazine (COMPAZINE) 25 MG suppository Place 1 suppository (25 mg total) rectally every 12 (twelve) hours as needed for nausea or vomiting.  12 suppository  0   No current facility-administered medications for this visit.    Review of Systems Review of Systems  Constitutional: Negative for fever, chills and unexpected weight change.  HENT: Negative for congestion, hearing loss, sore throat, trouble swallowing and voice change.   Eyes: Negative for visual  disturbance.  Respiratory: Negative for cough and wheezing.   Cardiovascular: Negative for chest pain, palpitations and leg swelling.  Gastrointestinal: Negative for nausea, vomiting, abdominal pain, diarrhea, constipation, blood in stool, abdominal distention, anal bleeding and rectal pain.  Genitourinary: Negative for hematuria and difficulty urinating.  Musculoskeletal: Negative for arthralgias.  Skin: Negative for rash and wound.  Neurological: Negative for seizures, syncope, weakness and headaches.  Hematological: Negative for adenopathy. Does not bruise/bleed easily.  Psychiatric/Behavioral: Negative for confusion.    Blood pressure 150/76, pulse 64, temperature 97 F (36.1 C), temperature source Temporal, resp. rate 18, height 5\' 11"  (1.803 m), weight 172 lb 12.8 oz (78.382 kg).  Physical Exam Physical Exam  Vitals reviewed. Constitutional: He appears well-developed and well-nourished.  Eyes: No scleral icterus.  Cardiovascular: Normal rate, regular rhythm and normal heart sounds.   Pulmonary/Chest: Effort normal and breath sounds normal. He has no wheezes. He has no rales.  Abdominal: Bowel sounds are normal. There is no tenderness.  Lymphadenopathy:    He has no cervical adenopathy.    Data Reviewed EXAM:  CT ABDOMEN AND PELVIS WITH CONTRAST  TECHNIQUE:  Multidetector CT imaging of the abdomen and pelvis was performed  using the standard protocol following bolus administration of  intravenous contrast.  CONTRAST: 135mL OMNIPAQUE IOHEXOL 300 MG/ML SOLN  COMPARISON: 12/05/2013  FINDINGS:  Lung bases are clear.  Liver, spleen, pancreas, and adrenal glands are within normal  limits.  Cholelithiasis (series 4/ image 25). No acute gallbladder wall  thickening or inflammatory changes. No intrahepatic or extrahepatic  ductal dilatation.  Mild postinfectious/inflammatory stranding in the right upper  abdomen, anterior to the gallbladder (series 4/ image 22). No  drainable  fluid collection/abscess.  Left renal cysts, including a dominant 5.6 x 4.9 cm lateral left  lower pole renal cyst. Right kidney is within normal limits. No  hydronephrosis.  No evidence of bowel obstruction. Normal appendix. Colonic  diverticulosis, without associated inflammatory changes.  Atherosclerotic calcifications of the abdominal aorta and branch  vessels.  No abdominopelvic ascites.  No suspicious abdominopelvic lymphadenopathy.  Prostatomegaly.  Bladder is within normal limits.  Degenerative changes of the visualized thoracolumbar spine. Status  post PLIF at L4-5.  IMPRESSION:  Cholelithiasis. No acute gallbladder wall thickening or inflammatory  changes.  Mild postinfectious/inflammatory stranding in the right upper  abdomen, anterior to the gallbladder. No drainable fluid  collection/abscess.   Assessment    History ruq abscess, cholecystitis     Plan    Laparoscopic, possible  open , cholecystectomy     Will need preop cardiac recommendations from Dr Johnsie Cancel.  Once we have those can proceed with surgery. I discussed the procedure in detail with the patient and his family.    We discussed the risks and benefits of a laparoscopic cholecystectomy and possible cholangiogram including, but not limited to bleeding, infection, injury to surrounding structures such as the intestine or liver, bile leak, retained gallstones, need to convert to an open procedure, prolonged diarrhea, blood clots such as  DVT, common bile duct injury, anesthesia risks, and possible need for additional procedures.  I did tell him he had a 20-30% risk of open cholecystectomy. The likelihood of improvement in symptoms and return to the patient's normal status is good. We discussed the typical post-operative recovery course.    Daegan Arizmendi 02/18/2014, 7:34 PM

## 2014-03-17 NOTE — Interval H&P Note (Signed)
History and Physical Interval Note:  03/17/2014 7:21 AM  [name] [name]  has presented today for surgery, with the diagnosis of cholecystitis  The various methods of treatment have been discussed with the patient and family. After consideration of risks, benefits and other options for treatment, the patient has consented to  Procedure(s): LAPAROSCOPIC CHOLECYSTECTOMY POSSIBLE OPEN (N/A) as a surgical intervention .  The patient's history has been reviewed, patient examined, no change in status, stable for surgery.  I have reviewed the patient's chart and labs.  Questions were answered to the patient's satisfaction.     [name]

## 2014-03-17 NOTE — Transfer of Care (Signed)
Immediate Anesthesia Transfer of Care Note  Patient: [name] [name]  Procedure(s) Performed: Procedure(s): LAPAROSCOPIC CHOLECYSTECTOMY  (N/A)  Patient Location: PACU  Anesthesia Type:General  Level of Consciousness: awake, alert  and oriented  Airway & Oxygen Therapy: Patient Spontanous Breathing and Patient connected to nasal cannula oxygen  Post-op Assessment: Report given to PACU RN and Post -op Vital signs reviewed and stable  Post vital signs: Reviewed and stable  Complications: No apparent anesthesia complications

## 2014-03-17 NOTE — Anesthesia Postprocedure Evaluation (Signed)
  Anesthesia Post-op Note  Patient: [name] [name]  Procedure(s) Performed: Procedure(s): LAPAROSCOPIC CHOLECYSTECTOMY  (N/A)  Patient Location: PACU  Anesthesia Type:General  Level of Consciousness: awake, alert , oriented and patient cooperative  Airway and Oxygen Therapy: Patient Spontanous Breathing  Post-op Pain: mild  Post-op Assessment: Post-op Vital signs reviewed, Patient's Cardiovascular Status Stable, Respiratory Function Stable, Patent Airway and No signs of Nausea or vomiting  Post-op Vital Signs: stable  Last Vitals:  Filed Vitals:   03/17/14 1128  BP: 123/60  Pulse: 59  Temp:   Resp: 15    Complications: No apparent anesthesia complications

## 2014-03-17 NOTE — Anesthesia Preprocedure Evaluation (Signed)
Anesthesia Evaluation  Patient identified by MRN, date of birth, ID band Patient awake    Reviewed: Allergy & Precautions, H&P , NPO status , Patient's Chart, lab work & pertinent test results  Airway       Dental   Pulmonary          Cardiovascular hypertension, + CAD     Neuro/Psych    GI/Hepatic GERD-  ,  Endo/Other  diabetesHypothyroidism   Renal/GU CRFRenal disease     Musculoskeletal   Abdominal   Peds  Hematology   Anesthesia Other Findings   Reproductive/Obstetrics                           Anesthesia Physical Anesthesia Plan  ASA: III  Anesthesia Plan: General   Post-op Pain Management:    Induction: Intravenous  Airway Management Planned: Oral ETT  Additional Equipment:   Intra-op Plan:   Post-operative Plan: Extubation in OR and Possible Post-op intubation/ventilation  Informed Consent: I have reviewed the patients History and Physical, chart, labs and discussed the procedure including the risks, benefits and alternatives for the proposed anesthesia with the patient or authorized representative who has indicated his/her understanding and acceptance.     Plan Discussed with:   Anesthesia Plan Comments:         Anesthesia Quick Evaluation

## 2014-03-18 ENCOUNTER — Encounter (HOSPITAL_COMMUNITY): Payer: Self-pay | Admitting: General Surgery

## 2014-03-18 DIAGNOSIS — K660 Peritoneal adhesions (postprocedural) (postinfection): Secondary | ICD-10-CM | POA: Diagnosis not present

## 2014-03-18 DIAGNOSIS — K811 Chronic cholecystitis: Secondary | ICD-10-CM | POA: Diagnosis not present

## 2014-03-18 DIAGNOSIS — E119 Type 2 diabetes mellitus without complications: Secondary | ICD-10-CM | POA: Diagnosis not present

## 2014-03-18 DIAGNOSIS — E785 Hyperlipidemia, unspecified: Secondary | ICD-10-CM | POA: Diagnosis not present

## 2014-03-18 DIAGNOSIS — G250 Essential tremor: Secondary | ICD-10-CM | POA: Diagnosis not present

## 2014-03-18 DIAGNOSIS — K219 Gastro-esophageal reflux disease without esophagitis: Secondary | ICD-10-CM | POA: Diagnosis not present

## 2014-03-18 LAB — CBC
HEMATOCRIT: 33.2 % — AB (ref 39.0–52.0)
Hemoglobin: 10.9 g/dL — ABNORMAL LOW (ref 13.0–17.0)
MCH: 31.4 pg (ref 26.0–34.0)
MCHC: 32.8 g/dL (ref 30.0–36.0)
MCV: 95.7 fL (ref 78.0–100.0)
Platelets: 193 10*3/uL (ref 150–400)
RBC: 3.47 MIL/uL — ABNORMAL LOW (ref 4.22–5.81)
RDW: 14.5 % (ref 11.5–15.5)
WBC: 9.5 10*3/uL (ref 4.0–10.5)

## 2014-03-18 LAB — COMPREHENSIVE METABOLIC PANEL
ALBUMIN: 2.4 g/dL — AB (ref 3.5–5.2)
ALT: 24 U/L (ref 0–53)
AST: 25 U/L (ref 0–37)
Alkaline Phosphatase: 79 U/L (ref 39–117)
Anion gap: 11 (ref 5–15)
BILIRUBIN TOTAL: 1.1 mg/dL (ref 0.3–1.2)
BUN: 17 mg/dL (ref 6–23)
CHLORIDE: 104 meq/L (ref 96–112)
CO2: 23 mEq/L (ref 19–32)
CREATININE: 1.19 mg/dL (ref 0.50–1.35)
Calcium: 8.6 mg/dL (ref 8.4–10.5)
GFR calc Af Amer: 63 mL/min — ABNORMAL LOW (ref >90)
GFR, EST NON AFRICAN AMERICAN: 55 mL/min — AB (ref >90)
Glucose, Bld: 110 mg/dL — ABNORMAL HIGH (ref 70–99)
Potassium: 4.8 mEq/L (ref 3.7–5.3)
SODIUM: 138 meq/L (ref 137–147)
Total Protein: 6 g/dL (ref 6.0–8.3)

## 2014-03-18 MED ORDER — ALLOPURINOL 300 MG PO TABS
300.0000 mg | ORAL_TABLET | Freq: Every day | ORAL | Status: DC
  Administered 2014-03-18 – 2014-03-20 (×3): 300 mg via ORAL
  Filled 2014-03-18 (×3): qty 1

## 2014-03-18 MED ORDER — PRIMIDONE 50 MG PO TABS
25.0000 mg | ORAL_TABLET | Freq: Every day | ORAL | Status: DC
  Administered 2014-03-18 – 2014-03-20 (×3): 25 mg via ORAL
  Filled 2014-03-18 (×3): qty 0.5

## 2014-03-18 MED ORDER — MENTHOL 3 MG MT LOZG
1.0000 | LOZENGE | OROMUCOSAL | Status: DC | PRN
  Administered 2014-03-18: 3 mg via ORAL
  Filled 2014-03-18 (×2): qty 9

## 2014-03-18 MED ORDER — OXYCODONE HCL 5 MG PO TABS: 5.0000 mg | ORAL_TABLET | ORAL | Status: DC | PRN

## 2014-03-18 MED ORDER — ENSURE COMPLETE PO LIQD: 237.0000 mL | Freq: Two times a day (BID) | ORAL | Status: DC

## 2014-03-18 MED ORDER — BOOST PLUS PO LIQD
237.0000 mL | Freq: Two times a day (BID) | ORAL | Status: DC
  Administered 2014-03-18 – 2014-03-20 (×2): 237 mL via ORAL
  Filled 2014-03-18 (×5): qty 237
  Filled 2014-03-18: qty 1
  Filled 2014-03-18 (×3): qty 237

## 2014-03-18 NOTE — Progress Notes (Addendum)
INITIAL NUTRITION ASSESSMENT  DOCUMENTATION CODES Per approved criteria  -Not applicable   INTERVENTION: -D/c Ensure BID -Boost Plus BID (pt prefers this product)  NUTRITION DIAGNOSIS: Increased nutrient needs related to recent surgeries and hospitalizations as evidenced by 7.4% wt loss x 3 months.   Goal: Pt will meet >90% of estimated nutritional needs  Monitor:  PO/supplement intake, labs, weight changes, I/O's  Reason for Assessment: Malnutrition Screen Score  80 y.o. male  Admitting Dx: <principal problem not specified>  ASSESSMENT: Pt with hx of cholecystitis and ruq abscess s/p laparoscopic drainage. He underwent ERCP to clear duct due to gangrenous cholecystitis and a right upper quadrant abscess a few months ago. S/p lap chole 03/17/14.  Pt reports he is very grateful that his gallbladder was taken out. Pt and family confirm that pt has been through a lot over since March. They revealed that pt had a tooth extraction and failed gallbladder surgery in March that resulted in decreased appetite and a 30# (14.6%) weight loss. Documented wt hx reveals a 7.4% wt loss over the past 3 months.  Pt reveals that he has had a very good appetite over the past 5 months and has gained 5 #. He reports he initially had difficulty chewing foods s/p tooth extraction, but has been consuming a regular diet at home and tolerates the foods well here. He revealed he ate all of his breakfast this morning. He also reports he has been drinking Boost High protein at home BID and would prefer this supplement over the Ensure that is currently ordered.  Reviewed with pt importance of good PO and supplement intake to promote healing.  Labs reviewed. Na and K WNL. Elevated glucose: 110. CBG 108-157.  Plans to d/c home tomorrow.   Nutrition Focused Physical Exam:  Subcutaneous Fat:  Orbital Region: WDL Upper Arm Region: WDL Thoracic and Lumbar Region: WDL  Muscle:  Temple Region: mild  depletion Clavicle Bone Region: WDL Clavicle and Acromion Bone Region: WDL Scapular Bone Region: mild depletion Dorsal Hand: WDL Patellar Region: WDL Anterior Thigh Region: WDL Posterior Calf Region: WDL  Edema: none present  Height: Ht Readings from Last 1 Encounters:  03/17/14 5\' 11"  (1.803 m)    Weight: Wt Readings from Last 1 Encounters:  03/17/14 176 lb (79.833 kg)    Ideal Body Weight: 172#  % Ideal Body Weight: 102%  Wt Readings from Last 10 Encounters:  03/17/14 176 lb (79.833 kg)  03/17/14 176 lb (79.833 kg)  03/10/14 176 lb 1.6 oz (79.878 kg)  02/18/14 172 lb 12.8 oz (78.382 kg)  02/04/14 173 lb (78.472 kg)  01/12/14 174 lb 3.2 oz (79.017 kg)  01/07/14 174 lb (78.926 kg)  01/04/14 184 lb 6.4 oz (83.643 kg)  12/20/13 185 lb (83.915 kg)  12/16/13 190 lb (86.183 kg)   Usual Body Weight: 200#  % Usual Body Weight: 88%  BMI:  Body mass index is 24.56 kg/(m^2). Normal weight range.   Estimated Nutritional Needs: Kcal: 2200-2400 Protein: 104-120 grams Fluid: 2.2-2.4 L  Skin: JP drain rt lateral abdomen, 4 port abdominal incision, closed abdominal incision   Diet Order: General  EDUCATION NEEDS: -Education needs addressed   Intake/Output Summary (Last 24 hours) at 03/18/14 1055 Last data filed at 03/18/14 0646  Gross per 24 hour  Intake    270 ml  Output   1970 ml  Net  -1700 ml    Last BM: 03/16/14  Labs:   Recent Labs Lab 03/17/14 1200 03/18/14 0552  NA  --  138  K  --  4.8  CL  --  104  CO2  --  23  BUN  --  17  CREATININE 1.16 1.19  CALCIUM  --  8.6  GLUCOSE  --  110*    CBG (last 3)   Recent Labs  03/17/14 0613 03/17/14 1022  GLUCAP 108* 157*   Lab Results  Component Value Date   HGBA1C 6.1* 12/05/2013    Scheduled Meds: . allopurinol  300 mg Oral Daily  . carvedilol  20 mg Oral Daily  . enoxaparin (LOVENOX) injection  40 mg Subcutaneous Q24H  . feeding supplement (ENSURE COMPLETE)  237 mL Oral BID BM  .  lisinopril  20 mg Oral Daily  . pantoprazole  40 mg Oral QAC breakfast  . primidone  25 mg Oral Daily    Continuous Infusions: . sodium chloride 50 mL/hr at 03/18/14 0840    Past Medical History  Diagnosis Date  . Hyperlipidemia 12/2005    takes Atorvastatin daily  . Benign essential tremor 2005    takes Primidone daily  . Gout 1995    "~ once/yr" (03/17/2014)  . Coronary artery disease 2007    s/p CABG, DR Johnsie Cancel  . Prostate cancer 2007    S/P treatment, followed by Urology  . Hypertension 1995    takes Lisinopril and Coreg daily  . Back pain     occasionally  . GERD (gastroesophageal reflux disease)     takes Nexium daily  . Constipation     takes OTC stool softener  . History of colon polyps   . Type II diabetes mellitus     no meds;diet and exercise controlled     Past Surgical History  Procedure Laterality Date  . Cystoscopy  02/15/2004    Mod BPH, moder tribec ?  . Prostate cryoablation  09/18/2005    prostate CA  . Adenosine myoview  01/24/2006    Ischemia by EKG, Cath  . Lumbar fusion  02/2012    lumbar spine for spinal stenosis  . Cataract extraction w/ intraocular lens implant Left 10/2013  . Ercp N/A 12/06/2013    Procedure: ENDOSCOPIC RETROGRADE CHOLANGIOPANCREATOGRAPHY (ERCP);  Surgeon: Milus Banister, MD;  Location: [place];  Service: Endoscopy;  Laterality: N/A;  . Coronary artery bypass graft  2007    CABGX 4  . Colonoscopy    . Cholecystectomy N/A 12/06/2013    Procedure: Diagnostic Laparoscopy for  drainage Intrabdominal abcess;  Surgeon: Rolm Bookbinder, MD;  Location: [place];  Service: General;  Laterality: N/A;  . Laparoscopic cholecystectomy  03/17/2014    Jay Haskew A. Jimmye Norman, RD, LDN Pager: [phone] After hours Pager: [phone]

## 2014-03-18 NOTE — Evaluation (Signed)
Physical Therapy Evaluation Patient Details Name: [name] [name] MRN: [id number] DOB: [birth year] Today's Date: 03/18/2014   History of Present Illness  s/p lap chole 03/17/14.  Clinical Impression  Patient is s/p surgery listed above resulting in functional limitations due to the deficits listed below (see PT Problem List).  Patient will benefit from skilled PT to increase their independence and safety with mobility to allow discharge to the venue listed below. Pt at min guard for mobility at this time and has difficulty managing RW around obstacles. Pt limited by pain . Plans to D/C tomorrow,home with wife.     Follow Up Recommendations No PT follow up;Supervision/Assistance - 24 hour    Equipment Recommendations  None recommended by PT    Recommendations for Other Services       Precautions / Restrictions Precautions Precautions: Fall Precaution Comments: JP abdomen bulb  Restrictions Weight Bearing Restrictions: No      Mobility  Bed Mobility               General bed mobility comments: pt sitting EOB upon arrival and returned to chair; bed mobility not assessed this session   Transfers Overall transfer level: Needs assistance Equipment used: Rolling walker (2 wheeled) Transfers: Sit to/from Stand Sit to Stand: Min guard         General transfer comment: min guard to steady when standing from bed; cues for hand placement and safety with RW  Ambulation/Gait Ambulation/Gait assistance: Min guard Ambulation Distance (Feet): 150 Feet Assistive device: Rolling walker (2 wheeled) Gait Pattern/deviations: Step-through pattern;Decreased stride length;Trunk flexed;Wide base of support;Drifts right/left Gait velocity: decreased due to pain  Gait velocity interpretation: Below normal speed for age/gender General Gait Details: pt with difficulty managing RW around obstacles; tends to run into objects in hallway and requires incr time to problem solve; min guard to  steady and manage RW; limited by pain   Stairs            Wheelchair Mobility    Modified Rankin (Stroke Patients Only)       Balance Overall balance assessment: Needs assistance Sitting-balance support: Feet supported;No upper extremity supported Sitting balance-Leahy Scale: Normal     Standing balance support: During functional activity;Bilateral upper extremity supported Standing balance-Leahy Scale: Poor Standing balance comment: relies heavily on RW for UE support at this time                              Pertinent Vitals/Pain 8/10 at surgical site. patient repositioned for comfort     Home Living Family/patient expects to be discharged to:: Private residence Living Arrangements: Spouse/significant other Available Help at Discharge: Family;Available 24 hours/day Type of Home: House Home Access: Level entry     Home Layout: Two level;Able to live on main level with bedroom/bathroom Home Equipment: Gilford Rile - 2 wheels;Cane - single point;Tub bench      Prior Function Level of Independence: Independent with assistive device(s)         Comments: ambulates with cane or RW; RW for community primarily      Hand Dominance        Extremity/Trunk Assessment   Upper Extremity Assessment: Overall WFL for tasks assessed           Lower Extremity Assessment: Generalized weakness (secondary to surgery)      Cervical / Trunk Assessment: Other exceptions  Communication   Communication: No difficulties  Cognition Arousal/Alertness: Awake/alert Behavior During Therapy:  WFL for tasks assessed/performed Overall Cognitive Status: Within Functional Limits for tasks assessed                      General Comments      Exercises        Assessment/Plan    PT Assessment Patient needs continued PT services  PT Diagnosis Abnormality of gait;Generalized weakness;Acute pain   PT Problem List Decreased strength;Decreased activity  tolerance;Decreased balance;Decreased mobility;Pain;Decreased knowledge of use of DME  PT Treatment Interventions DME instruction;Gait training;Functional mobility training;Therapeutic activities;Therapeutic exercise;Balance training;Neuromuscular re-education;Patient/family education   PT Goals (Current goals can be found in the Care Plan section) Acute Rehab PT Goals Patient Stated Goal: to go home tomorrow PT Goal Formulation: With patient Time For Goal Achievement: 03/20/14 Potential to Achieve Goals: Good    Frequency Min 3X/week   Barriers to discharge        Co-evaluation               End of Session Equipment Utilized During Treatment: Gait belt Activity Tolerance: Patient limited by pain Patient left: in chair;with call bell/phone within reach;with nursing/sitter in room Nurse Communication: Mobility status    Functional Assessment Tool Used: clinical judgement  Functional Limitation: Mobility: Walking and moving around Mobility: Walking and Moving Around Current Status [phone]): At least 1 percent but less than 20 percent impaired, limited or restricted Mobility: Walking and Moving Around Goal Status [phone]): 0 percent impaired, limited or restricted    Time: 6503-5465 PT Time Calculation (min): 16 min   Charges:   PT Evaluation $Initial PT Evaluation Tier I: 1 Procedure PT Treatments $Gait Training: 8-22 mins   PT G Codes:   Functional Assessment Tool Used: clinical judgement  Functional Limitation: Mobility: Walking and moving around    [place], [employer], [place]  [phone] 03/18/2014, 10:04 AM

## 2014-03-18 NOTE — Progress Notes (Signed)
1 Day Post-Op  Subjective: Feels well, tol some clear, not up yet  Objective: Vital signs in last 24 hours: Temp:  [96 F (35.6 C)-99.2 F (37.3 C)] 99.2 F (37.3 C) (07/10 0644) Pulse Rate:  [55-83] 76 (07/10 0644) Resp:  [10-18] 16 (07/10 0644) BP: (95-143)/(56-76) 139/64 mmHg (07/10 0644) SpO2:  [94 %-99 %] 94 % (07/10 0644) Last BM Date: 03/16/14  Intake/Output from previous day: 07/09 0701 - 07/10 0700 In: 1870 [I.V.:1820; IV Piggyback:50] Out: 1970 [Urine:1850; Drains:120] Intake/Output this shift:    General appearance: no distress GI: incisions clean, abd soft, approp tender, drain serosang  Lab Results:   Recent Labs  03/17/14 1200 03/18/14 0552  WBC 9.4 9.5  HGB 11.4* 10.9*  HCT 34.2* 33.2*  PLT 188 193   BMET  Recent Labs  03/17/14 1200  CREATININE 1.16    Anti-infectives: Anti-infectives   Start     Dose/Rate Route Frequency Ordered Stop   03/17/14 1400  cefOXitin (MEFOXIN) 2 g in dextrose 5 % 50 mL IVPB     2 g 100 mL/hr over 30 Minutes Intravenous Every 6 hours 03/17/14 1141 03/18/14 0220   03/17/14 0600  cefOXitin (MEFOXIN) 2 g in dextrose 5 % 50 mL IVPB     2 g 100 mL/hr over 30 Minutes Intravenous On call to O.R. 03/16/14 1451 03/17/14 0740      Assessment/Plan: POD 1 lap chole  1. Po pain meds with iv backup 2. pulm toilet 3. Pt consult due to deconditioning prior to surgery to see about home needs 4. Leave foley in place and attempt to remove tomorrow 5. Lovenox, scds  [facility] 03/18/2014

## 2014-03-18 NOTE — Progress Notes (Signed)
UR Completed Ahniya Mitchum Graves-Bigelow, RN,BSN [phone]  

## 2014-03-19 ENCOUNTER — Encounter (HOSPITAL_COMMUNITY): Payer: Self-pay | Admitting: Surgery

## 2014-03-19 DIAGNOSIS — K811 Chronic cholecystitis: Secondary | ICD-10-CM | POA: Diagnosis not present

## 2014-03-19 DIAGNOSIS — I4510 Unspecified right bundle-branch block: Secondary | ICD-10-CM | POA: Diagnosis present

## 2014-03-19 DIAGNOSIS — G252 Other specified forms of tremor: Secondary | ICD-10-CM | POA: Diagnosis not present

## 2014-03-19 DIAGNOSIS — R338 Other retention of urine: Secondary | ICD-10-CM

## 2014-03-19 DIAGNOSIS — G250 Essential tremor: Secondary | ICD-10-CM | POA: Diagnosis not present

## 2014-03-19 DIAGNOSIS — K219 Gastro-esophageal reflux disease without esophagitis: Secondary | ICD-10-CM | POA: Diagnosis not present

## 2014-03-19 DIAGNOSIS — K660 Peritoneal adhesions (postprocedural) (postinfection): Secondary | ICD-10-CM | POA: Diagnosis not present

## 2014-03-19 DIAGNOSIS — E785 Hyperlipidemia, unspecified: Secondary | ICD-10-CM | POA: Diagnosis not present

## 2014-03-19 DIAGNOSIS — E119 Type 2 diabetes mellitus without complications: Secondary | ICD-10-CM | POA: Diagnosis not present

## 2014-03-19 MED ORDER — POLYETHYLENE GLYCOL 3350 17 G PO PACK: 17.0000 g | PACK | Freq: Two times a day (BID) | ORAL | Status: DC | PRN

## 2014-03-19 MED ORDER — ACETAMINOPHEN 500 MG PO TABS
1000.0000 mg | ORAL_TABLET | Freq: Three times a day (TID) | ORAL | Status: DC
  Administered 2014-03-19 – 2014-03-20 (×3): 1000 mg via ORAL
  Filled 2014-03-19 (×5): qty 2

## 2014-03-19 MED ORDER — DIPHENHYDRAMINE HCL 25 MG PO CAPS: 25.0000 mg | ORAL_CAPSULE | Freq: Four times a day (QID) | ORAL | Status: DC | PRN

## 2014-03-19 MED ORDER — PHENOL 1.4 % MT LIQD: 2.0000 | OROMUCOSAL | Status: DC | PRN

## 2014-03-19 MED ORDER — METOPROLOL TARTRATE 12.5 MG HALF TABLET: 12.5000 mg | ORAL_TABLET | Freq: Two times a day (BID) | ORAL | Status: DC | PRN

## 2014-03-19 MED ORDER — ATORVASTATIN CALCIUM 40 MG PO TABS
40.0000 mg | ORAL_TABLET | Freq: Every day | ORAL | Status: DC
  Administered 2014-03-19 – 2014-03-20 (×2): 40 mg via ORAL
  Filled 2014-03-19 (×2): qty 1

## 2014-03-19 MED ORDER — PROMETHAZINE HCL 25 MG/ML IJ SOLN: 6.2500 mg | Freq: Four times a day (QID) | INTRAMUSCULAR | Status: DC | PRN

## 2014-03-19 MED ORDER — LIP MEDEX EX OINT
1.0000 | TOPICAL_OINTMENT | Freq: Two times a day (BID) | CUTANEOUS | Status: DC
Start: 2014-03-19 — End: 2014-03-20
  Administered 2014-03-19 – 2014-03-20 (×2): 1 via TOPICAL
  Filled 2014-03-19: qty 7

## 2014-03-19 MED ORDER — BISACODYL 10 MG RE SUPP
10.0000 mg | Freq: Two times a day (BID) | RECTAL | Status: DC | PRN
  Administered 2014-03-19: 10 mg via RECTAL
  Filled 2014-03-19: qty 1

## 2014-03-19 MED ORDER — DIPHENHYDRAMINE HCL 50 MG/ML IJ SOLN: 12.5000 mg | Freq: Four times a day (QID) | INTRAMUSCULAR | Status: DC | PRN

## 2014-03-19 MED ORDER — OXYCODONE HCL 5 MG PO TABS
5.0000 mg | ORAL_TABLET | ORAL | Status: DC | PRN
  Administered 2014-03-19: 5 mg via ORAL
  Administered 2014-03-19: 10 mg via ORAL
  Administered 2014-03-19: 5 mg via ORAL
  Administered 2014-03-20: 10 mg via ORAL
  Filled 2014-03-19: qty 2
  Filled 2014-03-19 (×2): qty 1
  Filled 2014-03-19: qty 2

## 2014-03-19 MED ORDER — LACTATED RINGERS IV BOLUS (SEPSIS): 1000.0000 mL | Freq: Three times a day (TID) | INTRAVENOUS | Status: DC | PRN

## 2014-03-19 MED ORDER — METOPROLOL TARTRATE 1 MG/ML IV SOLN: 5.0000 mg | Freq: Four times a day (QID) | INTRAVENOUS | Status: DC | PRN

## 2014-03-19 MED ORDER — MAGIC MOUTHWASH: 15.0000 mL | Freq: Four times a day (QID) | ORAL | Status: DC | PRN

## 2014-03-19 MED ORDER — ALUM & MAG HYDROXIDE-SIMETH 200-200-20 MG/5ML PO SUSP
30.0000 mL | Freq: Four times a day (QID) | ORAL | Status: DC | PRN
  Administered 2014-03-19: 30 mL via ORAL
  Filled 2014-03-19: qty 30

## 2014-03-19 MED ORDER — SODIUM CHLORIDE 0.9 % IJ SOLN
3.0000 mL | Freq: Two times a day (BID) | INTRAMUSCULAR | Status: DC
  Administered 2014-03-19 – 2014-03-20 (×3): 3 mL via INTRAVENOUS

## 2014-03-19 MED ORDER — SODIUM CHLORIDE 0.9 % IJ SOLN: 3.0000 mL | INTRAMUSCULAR | Status: DC | PRN

## 2014-03-19 MED ORDER — SACCHAROMYCES BOULARDII 250 MG PO CAPS
250.0000 mg | ORAL_CAPSULE | Freq: Two times a day (BID) | ORAL | Status: DC
  Administered 2014-03-19 – 2014-03-20 (×3): 250 mg via ORAL
  Filled 2014-03-19 (×4): qty 1

## 2014-03-19 NOTE — Progress Notes (Signed)
Physical Therapy Treatment Patient Details Name: [name] [name] MRN: [id number] DOB: [birth year] Today's Date: 03/19/2014    History of Present Illness      PT Comments    Pt progressing well with mobility.    Follow Up Recommendations  No PT follow up;Supervision/Assistance - 24 hour     Equipment Recommendations  None recommended by PT    Recommendations for Other Services       Precautions / Restrictions Precautions Precautions: Fall Precaution Comments: JP abdomen bulb    Mobility  Bed Mobility Overal bed mobility: Modified Independent                Transfers   Equipment used: Rolling walker (2 wheeled)   Sit to Stand: Min guard            Ambulation/Gait Ambulation/Gait assistance: Min guard Ambulation Distance (Feet): 400 Feet Assistive device: Rolling walker (2 wheeled) Gait Pattern/deviations: Step-through pattern;Decreased stride length;Drifts right/left Gait velocity: decreased       Stairs            Wheelchair Mobility    Modified Rankin (Stroke Patients Only)       Balance                                    Cognition Arousal/Alertness: Awake/alert Behavior During Therapy: WFL for tasks assessed/performed Overall Cognitive Status: Within Functional Limits for tasks assessed                      Exercises      General Comments        Pertinent Vitals/Pain 8/10    Home Living                      Prior Function            PT Goals (current goals can now be found in the care plan section) Progress towards PT goals: Progressing toward goals    Frequency  Min 3X/week    PT Plan Current plan remains appropriate    Co-evaluation             End of Session Equipment Utilized During Treatment: Gait belt Activity Tolerance: Patient tolerated treatment well Patient left: in chair;with call bell/phone within reach     Time: 0915-0940 PT Time Calculation (min):  25 min  Charges:  $Gait Training: 23-37 mins                    G Codes:  Functional Assessment Tool Used: clinical judgement Functional Limitation: Mobility: Walking and moving around Mobility: Walking and Moving Around Current Status [phone]): At least 1 percent but less than 20 percent impaired, limited or restricted Mobility: Walking and Moving Around Goal Status [phone]): 0 percent impaired, limited or restricted   Lorriane Shire 03/19/2014, 11:14 AM  Lorrin Goodell, PT  Office # [phone] Pager [phone]

## 2014-03-19 NOTE — Progress Notes (Signed)
[facility]  [address] [place], [place] [address] Phone: [phone] FAX: [phone]    KHARTER BREW [id number] 1929-03-03  CARE TEAM:  PCP: Elsie Stain, MD  Outpatient Care Team: Patient Care Team: Tonia Ghent, MD as PCP - General  Inpatient Treatment Team: Treatment Team: Attending Provider: Rolm Bookbinder, MD; Technician: Jeanene Erb, NT; Registered Nurse: Olga Millers Muthomi, RN; Respiratory Therapist: Gonzella Lex, RRT; Registered Nurse: Roselind Rily, RN; Physical Therapist: Philippa Sicks, PT; Technician: Deedra Ehrich, NT; Registered Nurse: Dagoberto Ligas, RN   Subjective:  Feeling better Foley out - no void yet Candy Sledge w PT w walker (well per PT)  Objective:  Vital signs:  Filed Vitals:   03/18/14 0644 03/18/14 1412 03/18/14 2135 03/19/14 0623  BP: 139/64 123/65 133/60 124/58  Pulse: 76 81 82 78  Temp: 99.2 F (37.3 C) 99.2 F (37.3 C) 98.1 F (36.7 C) 98.3 F (36.8 C)  TempSrc: Oral Oral Oral Oral  Resp: $Remo'16 18 16 16  'Kdnwg$ Height:      Weight:      SpO2: 94% 97% 94% 95%    Last BM Date: 03/16/14  Intake/Output   Yesterday:  07/10 0701 - 07/11 0700 In: 600 [P.O.:600] Out: 1925 [Urine:1875; Drains:50] This shift:  Total I/O In: 240 [P.O.:240] Out: -   Bowel function:  Flatus: m  BM: n  Drain: serosanguinous  Physical Exam:  General: Pt awake/alert/oriented x4 in no acute distress Eyes: PERRL, normal EOM.  Sclera clear.  No icterus Neuro: CN II-XII intact w/o focal sensory/motor deficits. Lymph: No head/neck/groin lymphadenopathy Psych:  No delerium/psychosis/paranoia HENT: Normocephalic, Mucus membranes moist.  No thrush Neck: Supple, No tracheal deviation Chest: No chest wall pain w good excursion CV:  Pulses intact.  Regular rhythm MS: Normal AROM mjr joints.  No obvious deformity Abdomen: Soft.  Min distended.  Mildly tender at incisions only.  No  evidence of peritonitis.  No incarcerated hernias. Ext:  SCDs BLE.  No mjr edema.  No cyanosis Skin: No petechiae / purpura   Problem List:   Principal Problem:   Acute cholecystitis s/p lap chole 03/17/2014 Active Problems:   DIABETES MELLITUS, TYPE II   HYPERTENSION   CKD (chronic kidney disease), stage III   Right bundle branch block (RBBB)   Assessment  Leward Quan  80 y.o. male  2 Days Post-Op  Procedure(s): Preoperative diagnosis: history cholecystitis and ruq abscess s/p laparoscopic drainage  Postoperative diagnosis: same as above  Procedure: Laparoscopic subtotal cholecystectomy, laparoscopic lysis of adhesions times 45 minutes  Surgeon: Dr Serita Grammes  Assist: Dr Verita Lamb   Improving  Plan:  -bowel regimen -follow off IVF -HTN controlled -?DM< - HgbA1C OK & glc <140 -follow w foley out -VTE prophylaxis- SCDs, etc -mobilize as tolerated to help recovery  D/C patient from hospital when patient meets criteria (anticipate in 1-2 day(s)):  Tolerating oral intake well Ambulating in walkways Adequate pain control without IV medications Urinating  Having flatus   Adin Hector, M.D., F.A.C.S. Gastrointestinal and Minimally Invasive Surgery [facility] [address], [place], [place] [address] [phone] Main / Paging   03/19/2014   Results:   Labs: Results for orders placed during the hospital encounter of 03/17/14 (from the past 48 hour(s))  GLUCOSE, CAPILLARY     Status: Abnormal   Collection Time    03/17/14 10:22 AM      Result  Value Ref Range   Glucose-Capillary 157 (*) 70 - 99 mg/dL   Comment 1 Notify RN    CBC     Status: Abnormal   Collection Time    03/17/14 12:00 PM      Result Value Ref Range   WBC 9.4  4.0 - 10.5 K/uL   RBC 3.56 (*) 4.22 - 5.81 MIL/uL   Hemoglobin 11.4 (*) 13.0 - 17.0 g/dL   HCT 20.9 (*) 10.6 - 81.6 %   MCV 96.1  78.0 - 100.0 fL   MCH 32.0  26.0 - 34.0 pg   MCHC  33.3  30.0 - 36.0 g/dL   RDW 61.9  69.4 - 09.8 %   Platelets 188  150 - 400 K/uL  CREATININE, SERUM     Status: Abnormal   Collection Time    03/17/14 12:00 PM      Result Value Ref Range   Creatinine, Ser 1.16  0.50 - 1.35 mg/dL   GFR calc non Af Amer 56 (*) >90 mL/min   GFR calc Af Amer 65 (*) >90 mL/min   Comment: (NOTE)     The eGFR has been calculated using the CKD EPI equation.     This calculation has not been validated in all clinical situations.     eGFR's persistently <90 mL/min signify possible Chronic Kidney     Disease.  COMPREHENSIVE METABOLIC PANEL     Status: Abnormal   Collection Time    03/18/14  5:52 AM      Result Value Ref Range   Sodium 138  137 - 147 mEq/L   Potassium 4.8  3.7 - 5.3 mEq/L   Chloride 104  96 - 112 mEq/L   CO2 23  19 - 32 mEq/L   Glucose, Bld 110 (*) 70 - 99 mg/dL   BUN 17  6 - 23 mg/dL   Creatinine, Ser 2.86  0.50 - 1.35 mg/dL   Calcium 8.6  8.4 - 75.1 mg/dL   Total Protein 6.0  6.0 - 8.3 g/dL   Albumin 2.4 (*) 3.5 - 5.2 g/dL   AST 25  0 - 37 U/L   ALT 24  0 - 53 U/L   Alkaline Phosphatase 79  39 - 117 U/L   Total Bilirubin 1.1  0.3 - 1.2 mg/dL   GFR calc non Af Amer 55 (*) >90 mL/min   GFR calc Af Amer 63 (*) >90 mL/min   Comment: (NOTE)     The eGFR has been calculated using the CKD EPI equation.     This calculation has not been validated in all clinical situations.     eGFR's persistently <90 mL/min signify possible Chronic Kidney     Disease.   Anion gap 11  5 - 15  CBC     Status: Abnormal   Collection Time    03/18/14  5:52 AM      Result Value Ref Range   WBC 9.5  4.0 - 10.5 K/uL   RBC 3.47 (*) 4.22 - 5.81 MIL/uL   Hemoglobin 10.9 (*) 13.0 - 17.0 g/dL   HCT 98.2 (*) 42.9 - 98.0 %   MCV 95.7  78.0 - 100.0 fL   MCH 31.4  26.0 - 34.0 pg   MCHC 32.8  30.0 - 36.0 g/dL   RDW 69.9  96.7 - 22.7 %   Platelets 193  150 - 400 K/uL    Imaging / Studies: No results found.  Medications / Allergies: per  chart  Antibiotics: Anti-infectives   Start     Dose/Rate Route Frequency Ordered Stop   03/17/14 1400  cefOXitin (MEFOXIN) 2 g in dextrose 5 % 50 mL IVPB     2 g 100 mL/hr over 30 Minutes Intravenous Every 6 hours 03/17/14 1141 03/18/14 0220   03/17/14 0600  cefOXitin (MEFOXIN) 2 g in dextrose 5 % 50 mL IVPB     2 g 100 mL/hr over 30 Minutes Intravenous On call to O.R. 03/16/14 1451 03/17/14 0740       Note: This dictation was prepared with voice recognition software technology. In this process, transcriptional errors may occur.  Attempts are made to proofread & provide accurate documentation.  Any errors are unintentional.

## 2014-03-20 DIAGNOSIS — G250 Essential tremor: Secondary | ICD-10-CM | POA: Diagnosis not present

## 2014-03-20 DIAGNOSIS — K811 Chronic cholecystitis: Secondary | ICD-10-CM | POA: Diagnosis not present

## 2014-03-20 DIAGNOSIS — K660 Peritoneal adhesions (postprocedural) (postinfection): Secondary | ICD-10-CM | POA: Diagnosis not present

## 2014-03-20 DIAGNOSIS — K219 Gastro-esophageal reflux disease without esophagitis: Secondary | ICD-10-CM | POA: Diagnosis not present

## 2014-03-20 DIAGNOSIS — E119 Type 2 diabetes mellitus without complications: Secondary | ICD-10-CM | POA: Diagnosis not present

## 2014-03-20 DIAGNOSIS — E785 Hyperlipidemia, unspecified: Secondary | ICD-10-CM | POA: Diagnosis not present

## 2014-03-20 MED ORDER — TAMSULOSIN HCL 0.4 MG PO CAPS
0.4000 mg | ORAL_CAPSULE | Freq: Every day | ORAL | Status: DC
  Administered 2014-03-20: 0.4 mg via ORAL
  Filled 2014-03-20: qty 1

## 2014-03-20 MED ORDER — TAMSULOSIN HCL 0.4 MG PO CAPS: 0.4000 mg | ORAL_CAPSULE | Freq: Every day | ORAL | Status: DC

## 2014-03-20 NOTE — Care Management Note (Signed)
    Page 1 of 1   03/20/2014     2:33:28 PM CARE MANAGEMENT NOTE 03/20/2014  Patient:  Pellicano,Beulah R   Account Number:  [id number]  Date Initiated:  03/20/2014  Documentation initiated by:  JEFFRIES,SARAH  Subjective/Objective Assessment:   adm: Acute cholecystitis s/p lap chole     Action/Plan:   discharge planning   Anticipated DC Date:  03/20/2014   Anticipated DC Plan:  HOME W HOME HEALTH SERVICES      DC Planning Services  CM consult      PAC Choice  HOME HEALTH   Choice offered to / List presented to:  C-1 Patient        HH arranged  HH-1 RN      HH agency  [facility]   Status of service:  Completed, signed off Medicare Important Message given?  YES (If response is "NO", the following Medicare IM given date fields will be blank) Date Medicare IM given:   Medicare IM given by:   Date Additional Medicare IM given:  03/20/2014 Additional Medicare IM given by:  SARAH JEFFRIES  Discharge Disposition:  HOME W HOME HEALTH SERVICES  Per UR Regulation:    If discussed at Long Length of Stay Meetings, dates discussed:    Comments:  03/20/14 14:30 CM met with pt to offer choice of home health agency.  Pt states he has had [facility] in the past and would like them to render HHRN services now.  CM verified address and contact information with the pt.  CM called referral to [facility] rep, Mary Manley voicemail.  No other CM needs were communicated.  Sarah Jeffries, BSN, CM [phone].   

## 2014-03-20 NOTE — Discharge Summary (Signed)
Physician Discharge Summary  Patient ID: [name] [name] MRN: [id number] DOB/AGE: 1928/11/25 80 y.o.  Admit date: 03/17/2014 Discharge date: 03/20/2014  Admission Diagnoses:  Discharge Diagnoses:  Principal Problem:   Acute cholecystitis s/p lap chole 03/17/2014 Active Problems:   DIABETES MELLITUS, TYPE II   HYPERTENSION   ADENOCARCINOMA, PROSTATE, HX OF   CKD (chronic kidney disease), stage III   Right bundle branch block (RBBB)   Acute retention of urine  Preoperative diagnosis: history cholecystitis and ruq abscess s/p laparoscopic drainage  Postoperative diagnosis: same as above  Procedure: Laparoscopic subtotal cholecystectomy, laparoscopic lysis of adhesions times 45 minutes  Surgeon: Dr Serita Grammes  Assist: Dr Verita Lamb    Discharged Condition: good  Hospital Course: The patient underwent the surgery above.  Postoperatively, the patient gradually mobilized in the hallways and advanced to a solid diet.  Had recurrent urinary retention so had Foley catheter replaced.   Pain and other symptoms were treated aggressively.    By the time of discharge, the patient was walking well the hallways, eating food well, having flatus.  Pain was well-controlled on an oral regimen.  Based on meeting discharge criteria and continuing to recover, I felt it was safe for the patient to be discharged from the hospital with close followup.  Instructions were discussed in detail.  They are written as well.     Consults: None  Significant Diagnostic Studies:   No results found for this or any previous visit (from the past 48 hour(s)).   Discharge Exam: Blood pressure 129/65, pulse 71, temperature 97.7 F (36.5 C), temperature source Oral, resp. rate 18, height 5\' 11"  (1.803 m), weight 176 lb (79.833 kg), SpO2 98.00%.  General: Pt awake/alert/oriented x4 in no acute distress  Eyes: PERRL, normal EOM. Sclera clear. No icterus  Neuro: CN II-XII intact w/o focal sensory/motor deficits.   Lymph: No head/neck/groin lymphadenopathy  Psych: No delerium/psychosis/paranoia  HENT: Normocephalic, Mucus membranes moist. No thrush  Neck: Supple, No tracheal deviation  Chest: No chest wall pain w good excursion  CV: Pulses intact. Regular rhythm  MS: Normal AROM mjr joints. No obvious deformity  Abdomen: Soft. Min distended. Mildly tender at incisions only. No evidence of peritonitis. No incarcerated hernias.  Ext: SCDs BLE. No mjr edema. No cyanosis  Skin: No petechiae / purpura      Disposition: 03-Skilled Nursing Facility  Discharge Instructions   Call MD for:  extreme fatigue    Complete by:  As directed      Call MD for:  hives    Complete by:  As directed      Call MD for:  persistant nausea and vomiting    Complete by:  As directed      Call MD for:  redness, tenderness, or signs of infection (pain, swelling, redness, odor or green/yellow discharge around incision site)    Complete by:  As directed      Call MD for:  severe uncontrolled pain    Complete by:  As directed      Call MD for:    Complete by:  As directed   Temperature > 101.41F     Diet - low sodium heart healthy    Complete by:  As directed      Discharge instructions    Complete by:  As directed   Please see discharge instruction sheets.  Also refer to handout given an office.  Please call our office if you have any questions or concerns [phone]  Discharge wound care:    Complete by:  As directed   You have closed incisions, shower and bathe over these incisions & your drain & Foley bladder catheter with soap and water every day.   You do not need to replace dressings over the closed incisions unless you feel more comfortable with a Band-Aid covering it.   If you have an open wound that requires packing, please see wound care instructions.  In general, remove all dressings, wash wound with soap and water and then replace with saline moistened gauze.  Do the dressing change at least every  day.  Please call our office [phone] if you have further questions.     Driving Restrictions    Complete by:  As directed   No driving until off narcotics and can safely swerve away without pain during an emergency     Increase activity slowly    Complete by:  As directed   Walk an hour a day.  Use 20-30 minute walks.  When you can walk 30 minutes without difficulty, increase to low impact/moderate activities such as biking, jogging, swimming, sexual activity..  Eventually can increase to unrestricted activity when not feeling pain.  If you feel pain: STOP!Marland Kitchen   Let pain protect you from overdoing it.  Use ice/heat/over-the-counter pain medications to help minimize his soreness.  Use pain prescriptions as needed to remain active.  It is better to take extra pain medications and be more active than to stay bedridden to avoid all pain medications.     Lifting restrictions    Complete by:  As directed   Avoid heavy lifting initially.  Do not push through pain.  You have no specific weight limit.  Coughing and sneezing or four more stressful to your incision than any lifting you will do. Pain will protect you from injury.  Therefore, avoid intense activity until off all narcotic pain medications.  Coughing and sneezing or four more stressful to your incision than any lifting he will do.     May shower / Bathe    Complete by:  As directed      May walk up steps    Complete by:  As directed      Sexual Activity Restrictions    Complete by:  As directed   Sexual activity as tolerated.  Do not push through pain.  Pain will protect you from injury.     Walk with assistance    Complete by:  As directed   Walk over an hour a day.  May use a walker/cane/companion to help with balance and stamina.            Medication List         allopurinol 300 MG tablet  Commonly known as:  ZYLOPRIM  Take 300 mg by mouth daily.     atorvastatin 40 MG tablet  Commonly known as:  LIPITOR  Take 40 mg by  mouth daily.     carvedilol 20 MG 24 hr capsule  Commonly known as:  COREG CR  Take 1 capsule (20 mg total) by mouth daily.     esomeprazole 20 MG capsule  Commonly known as:  NEXIUM  Take 20 mg by mouth daily as needed (acid reflux).     feeding supplement (ENSURE COMPLETE) Liqd  Take 237 mLs by mouth 2 (two) times daily between meals.     lisinopril 20 MG tablet  Commonly known as:  PRINIVIL,ZESTRIL  Take 20 mg by mouth  daily.     oxyCODONE 5 MG immediate release tablet  Commonly known as:  Oxy IR/ROXICODONE  Take 1 tablet (5 mg total) by mouth every 4 (four) hours as needed for moderate pain.     primidone 50 MG tablet  Commonly known as:  MYSOLINE  Take 25 mg by mouth daily.     tamsulosin 0.4 MG Caps capsule  Commonly known as:  FLOMAX  Take 1 capsule (0.4 mg total) by mouth daily.           Follow-up Information   Follow up with [facility], MD In 2 weeks.   Specialty:  General Surgery   Contact information:   [address] [place] [address] [phone]       Follow up with Carolan Clines I, MD. Schedule an appointment as soon as possible for a visit in 3 days. (To have your Foley catheter & bladder evaluated)    Specialty:  Urology   Contact information:   [place] [address] [phone]       Signed: Mehtaab Mayeda C. 03/20/2014, 9:39 AM

## 2014-03-20 NOTE — Discharge Instructions (Signed)
DRAIN CARE:   You have a closed bulb drain to help you heal.  A bulb drain is a small, plastic reservoir which creates a gentle suction. It is used to remove excess fluid from a surgical wound. The color and amount of fluid will vary. Immediately after surgery, the fluid is bright red. It may gradually change to a yellow color. When the amount decreases to about 1 or 2 tablespoons (15 to 30 cc) per 24 hours, your caregiver will usually remove it.  DAILY CARE  Keep the bulb compressed at all times, except while emptying it. The compression creates suction.   Keep sites where the tubes enter the skin dry and covered with a light bandage (dressing).   Tape the tubes to your skin, 1 to 2 inches below the insertion sites, to keep from pulling on your stitches. Tubes are stitched in place and will not slip out.   Pin the bulb to your shirt (not to your pants) with a safety pin.   For the first few days after surgery, there usually is more fluid in the bulb. Empty the bulb whenever it becomes half full because the bulb does not create enough suction if it is too full. Include this amount in your 24 hour totals.   When the amount of drainage decreases, empty the bulb at the same time every day. Write down the amounts and the 24 hour totals. Your caregiver will want to know them. This helps your caregiver know when the tubes can be removed.   (We anticipate removing the drain in 1-3 weeks, depending on when the output is <16mL a day for 2+ days)  If there is drainage around the tube sites, change dressings and keep the area dry. If you see a clot in the tube, leave it alone. However, if the tube does not appear to be draining, let your caregiver know.  TO EMPTY THE BULB  Open the stopper to release suction.   Holding the stopper out of the way, pour drainage into the measuring cup that was sent home with you.   Measure and write down the amount. If there are 2 bulbs,  note the amount of drainage from bulb 1 or bulb 2 and keep the totals separate. Your caregiver will want to know which tube is draining more.   Compress the bulb by folding it in half.   Replace the stopper.   Check the tape that holds the tube to your skin, and pin the bulb to your shirt.  SEEK MEDICAL CARE IF:  The drainage develops a bad odor.   You have an oral temperature above 102 F (38.9 C).   The amount of drainage from your wound suddenly increases or decreases.   You accidentally pull out your drain.   You have any other questions or concerns.  MAKE SURE YOU:   Understand these instructions.   Will watch your condition.   Will get help right away if you are not doing well or get worse.    Call our office if you have any questions about your drain. [place]: POST OP INSTRUCTIONS  1. DIET: Follow a light bland diet the first 24 hours after arrival home, such as soup, liquids, crackers, etc.  Be sure to include lots of fluids daily.  Avoid fast food or heavy meals as your are more likely to get nauseated.  Eat a low fat the next few days after surgery.   2. Take your  usually prescribed home medications unless otherwise directed. 3. PAIN CONTROL: a. Pain is best controlled by a usual combination of three different methods TOGETHER: i. Ice/Heat ii. Over the counter pain medication iii. Prescription pain medication b. Most patients will experience some swelling and bruising around the incisions.  Ice packs or heating pads (30-60 minutes up to 6 times a day) will help. Use ice for the first few days to help decrease swelling and bruising, then switch to heat to help relax tight/sore spots and speed recovery.  Some people prefer to use ice alone, heat alone, alternating between ice & heat.  Experiment to what works for you.  Swelling and bruising can take several weeks to resolve.   c. It is helpful to take an over-the-counter pain medication  regularly for the first few weeks.  Choose one of the following that works best for you: i. Naproxen (Aleve, etc)  Two 220mg  tabs twice a day ii. Ibuprofen (Advil, etc) Three 200mg  tabs four times a day (every meal & bedtime) iii. Acetaminophen (Tylenol, etc) 500-650mg  four times a day (every meal & bedtime) d. A  prescription for pain medication (such as oxycodone, hydrocodone, etc) should be given to you upon discharge.  Take your pain medication as prescribed.  i. If you are having problems/concerns with the prescription medicine (does not control pain, nausea, vomiting, rash, itching, etc), please call us [phone] to see if we need to switch you to a different pain medicine that will work better for you and/or control your side effect better. ii. If you need a refill on your pain medication, please contact your pharmacy.  They will contact our office to request authorization. Prescriptions will not be filled after 5 pm or on week-ends. 4. Avoid getting constipated.  Between the surgery and the pain medications, it is common to experience some constipation.  Increasing fluid intake and taking a fiber supplement (such as Metamucil, Citrucel, FiberCon, MiraLax, etc) 1-2 times a day regularly will usually help prevent this problem from occurring.  A mild laxative (prune juice, Milk of Magnesia, MiraLax, etc) should be taken according to package directions if there are no bowel movements after 48 hours.   5. Watch out for diarrhea.  If you have many loose bowel movements, simplify your diet to bland foods & liquids for a few days.  Stop any stool softeners and decrease your fiber supplement.  Switching to mild anti-diarrheal medications (Kayopectate, Pepto Bismol) can help.  If this worsens or does not improve, please call us. 6. Wash / shower every day.  You may shower over the dressings as they are waterproof.  Continue to shower over incision(s) after the dressing is off. 7. Remove your waterproof  bandages 5 days after surgery.  You may leave the incision open to air.  You may replace a dressing/Band-Aid to cover the incision for comfort if you wish.  8. ACTIVITIES as tolerated:   a. You may resume regular (light) daily activities beginning the next day--such as daily self-care, walking, climbing stairs--gradually increasing activities as tolerated.  If you can walk 30 minutes without difficulty, it is safe to try more intense activity such as jogging, treadmill, bicycling, low-impact aerobics, swimming, etc. b. Save the most intensive and strenuous activity for last such as sit-ups, heavy lifting, contact sports, etc  Refrain from any heavy lifting or straining until you are off narcotics for pain control.   c. DO NOT PUSH THROUGH PAIN.  Let pain be your guide: If  it hurts to do something, don't do it.  Pain is your body warning you to avoid that activity for another week until the pain goes down. d. You may drive when you are no longer taking prescription pain medication, you can comfortably wear a seatbelt, and you can safely maneuver your car and apply brakes. e. [name] [name] may have sexual intercourse when it is comfortable.  9. FOLLOW UP in our office a. Please call [facility] at [phone] to set up an appointment to see your surgeon in the office for a follow-up appointment approximately 2-3 weeks after your surgery. b. Make sure that you call for this appointment the day you arrive home to insure a convenient appointment time. 10. IF YOU HAVE DISABILITY OR FAMILY LEAVE FORMS, BRING THEM TO THE OFFICE FOR PROCESSING.  DO NOT GIVE THEM TO YOUR DOCTOR.   WHEN TO CALL us [phone]: 1. Poor pain control 2. Reactions / problems with new medications (rash/itching, nausea, etc)  3. Fever over 101.5 F (38.5 C) 4. Inability to urinate 5. Nausea and/or vomiting 6. Worsening swelling or bruising 7. Continued bleeding from incision. 8. Increased pain, redness, or drainage from the incision   The  clinic staff is available to answer your questions during regular business hours (8:30am-5pm).  Please dont hesitate to call and ask to speak to one of our nurses for clinical concerns.   If you have a medical emergency, go to the nearest emergency room or call 911.  A surgeon from [facility] is always on call at the [facility], [place], [place], [place], [place]  [address] ? MAIN: [phone] ? TOLL FREE: [phone] ?  FAX [phone] [email or web address]  Cholecystitis Cholecystitis is an inflammation of your gallbladder. It is usually caused by a buildup of gallstones or sludge (cholelithiasis) in your gallbladder. The gallbladder stores a fluid that helps digest fats (bile). Cholecystitis is serious and needs treatment right away.  CAUSES   Gallstones. Gallstones can block the tube that leads to your gallbladder, causing bile to build up. As bile builds up, the gallbladder becomes inflamed.  Bile duct problems, such as blockage from scarring or kinking.  Tumors. Tumors can stop bile from leaving your gallbladder correctly, causing bile to build up. As bile builds up, the gallbladder becomes inflamed. SYMPTOMS   Nausea.  Vomiting.  Abdominal pain, especially in the upper right area of your abdomen.  Abdominal tenderness or bloating.  Sweating.  Chills.  Fever.  Yellowing of the skin and the whites of the eyes (jaundice). DIAGNOSIS  Your caregiver may order blood tests to look for infection or gallbladder problems. Your caregiver may also order imaging tests, such as an ultrasound or computed tomography (CT) scan. Further tests may include a hepatobiliary iminodiacetic acid (HIDA) scan. This scan allows your caregiver to see your bile move from the liver to the gallbladder and to the small intestine. TREATMENT  A hospital stay is usually necessary to lessen the inflammation of your gallbladder. You  may be required to not eat or drink (fast) for a certain amount of time. You may be given medicine to treat pain or an antibiotic medicine to treat an infection. Surgery may be needed to remove your gallbladder (cholecystectomy) once the inflammation has gone down. Surgery may be needed right away if you develop complications such as death of gallbladder tissue (gangrene) or a tear (perforation) of the gallbladder.  [place]  care will depend on your treatment. In general:  If you were given antibiotics, take them as directed. Finish them even if you start to feel better.  Only take over-the-counter or prescription medicines for pain, discomfort, or fever as directed by your caregiver.  Follow a low-fat diet until you see your caregiver again.  Keep all follow-up visits as directed by your caregiver. SEEK IMMEDIATE MEDICAL CARE IF:   Your pain is increasing and not controlled by medicines.  Your pain moves to another part of your abdomen or to your back.  You have a fever.  You have nausea and vomiting. MAKE SURE YOU:  Understand these instructions.  Will watch your condition.  Will get help right away if you are not doing well or get worse. Document Released: 08/26/2005 Document Revised: 11/18/2011 Document Reviewed: 07/12/2011 [facility] Patient Information 2015 [place], [place]. This information is not intended to replace advice given to you by your health care provider. Make sure you discuss any questions you have with your health care provider.  Acute Urinary Retention Acute urinary retention is the temporary inability to urinate. This is a common problem in older men. As men age their prostates become larger and block the flow of urine from the bladder. This is usually a problem that has come on gradually.  HOME CARE INSTRUCTIONS If you are sent home with a Foley catheter and a drainage system, you will need to discuss the best course of action with your health  care provider. While the catheter is in, maintain a good intake of fluids. Keep the drainage bag emptied and lower than your catheter. This is so that contaminated urine will not flow back into your bladder, which could lead to a urinary tract infection. There are two main types of drainage bags. One is a large bag that usually is used at night. It has a good capacity that will allow you to sleep through the night without having to empty it. The second type is called a leg bag. It has a smaller capacity, so it needs to be emptied more frequently. However, the main advantage is that it can be attached by a leg strap and can go underneath your clothing, allowing you the freedom to move about or leave your home. Only take over-the-counter or prescription medicines for pain, discomfort, or fever as directed by your health care provider.  SEEK MEDICAL CARE IF:  You develop a low-grade fever.  You experience spasms or leakage of urine with the spasms. SEEK IMMEDIATE MEDICAL CARE IF:   You develop chills or fever.  Your catheter stops draining urine.  Your catheter falls out.  You start to develop increased bleeding that does not respond to rest and increased fluid intake. MAKE SURE YOU:  Understand these instructions.  Will watch your condition.  Will get help right away if you are not doing well or get worse. Document Released: 12/02/2000 Document Revised: 08/31/2013 Document Reviewed: 02/04/2013 [facility] Patient Information 2015 [place], [place]. This information is not intended to replace advice given to you by your health care provider. Make sure you discuss any questions you have with your health care provider.  .Foley Catheter Care, Adult A Foley catheter is a soft, flexible tube that is placed into the bladder to drain urine. A Foley catheter may be inserted if:  You leak urine or are not able to control when you urinate (urinary incontinence).  You are not able to urinate when you  need to (urinary retention).  You  had prostate surgery or surgery on the genitals.  You have certain medical conditions, such as multiple sclerosis, dementia, or a spinal cord injury. If you are going home with a Foley catheter in place, follow the instructions below. TAKING CARE OF THE CATHETER 1. Wash your hands with soap and water. 2. Using mild soap and warm water on a clean washcloth:  Clean the area on your body closest to the catheter insertion site using a circular motion, moving away from the catheter. Never wipe toward the catheter because this could sweep bacteria up into the urethra and cause infection.  Remove all traces of soap. Pat the area dry with a clean towel. For males, reposition the foreskin. 3. Attach the catheter to your leg so there is no tension on the catheter. Use adhesive tape or a leg strap. If you are using adhesive tape, remove any sticky residue left behind by the previous tape you used. 4. Keep the drainage bag below the level of the bladder, but keep it off the floor. 5. Check throughout the day to be sure the catheter is working and urine is draining freely. Make sure the tubing does not become kinked. 6. Do not pull on the catheter or try to remove it. Pulling could damage internal tissues. TAKING CARE OF THE DRAINAGE BAGS You will be given two drainage bags to take home. One is a large overnight drainage bag, and the other is a smaller leg bag that fits underneath clothing. You may wear the overnight bag at any time, but you should never wear the smaller leg bag at night. Follow the instructions below for how to empty, change, and clean your drainage bags. Emptying the Drainage Bag You must empty your drainage bag when it is  - full or at least 2-3 times a day. 1. Wash your hands with soap and water. 2. Keep the drainage bag below your hips, below the level of your bladder. This stops urine from going back into the tubing and into your bladder. 3. Hold  the dirty bag over the toilet or a clean container. 4. Open the pour spout at the bottom of the bag and empty the urine into the toilet or container. Do not let the pour spout touch the toilet, container, or any other surface. Doing so can place bacteria on the bag, which can cause an infection. 5. Clean the pour spout with a gauze pad or cotton ball that has rubbing alcohol on it. 6. Close the pour spout. 7. Attach the bag to your leg with adhesive tape or a leg strap. 8. Wash your hands well. Changing the Drainage Bag Change your drainage bag once a month or sooner if it starts to smell bad or look dirty. Below are steps to follow when changing the drainage bag. 1. Wash your hands with soap and water. 2. Pinch off the rubber catheter so that urine does not spill out. 3. Disconnect the catheter tube from the drainage tube at the connection valve. Do not let the tubes touch any surface. 4. Clean the end of the catheter tube with an alcohol wipe. Use a different alcohol wipe to clean the end of the drainage tube. 5. Connect the catheter tube to the drainage tube of the clean drainage bag. 6. Attach the new bag to the leg with adhesive tape or a leg strap. Avoid attaching the new bag too tightly. 7. Wash your hands well. Cleaning the Drainage Bag 1. Wash your hands with soap and  water. 2. Wash the bag in warm, soapy water. 3. Rinse the bag thoroughly with warm water. 4. Fill the bag with a solution of white vinegar and water (1 cup vinegar to 1 qt warm water [.2 L vinegar to 1 L warm water]). Close the bag and soak it for 30 minutes in the solution. 5. Rinse the bag with warm water. 6. Hang the bag to dry with the pour spout open and hanging downward. 7. Store the clean bag (once it is dry) in a clean plastic bag. 8. Wash your hands well. PREVENTING INFECTION  Wash your hands before and after handling your catheter.  Take showers daily and wash the area where the catheter enters your body.  Do not take baths. Replace wet leg straps with dry ones, if this applies.  Do not use powders, sprays, or lotions on the genital area. Only use creams, lotions, or ointments as directed by your caregiver.  For females, wipe from front to back after each bowel movement.  Drink enough fluids to keep your urine clear or pale yellow unless you have a fluid restriction.  Do not let the drainage bag or tubing touch or lie on the floor.  Wear cotton underwear to absorb moisture and to keep your skin drier. SEEK MEDICAL CARE IF:   Your urine is cloudy or smells unusually bad.  Your catheter becomes clogged.  You are not draining urine into the bag or your bladder feels full.  Your catheter starts to leak. SEEK IMMEDIATE MEDICAL CARE IF:   You have pain, swelling, redness, or pus where the catheter enters the body.  You have pain in the abdomen, legs, lower back, or bladder.  You have a fever.  You see blood fill the catheter, or your urine is pink or red.  You have nausea, vomiting, or chills.  Your catheter gets pulled out. MAKE SURE YOU:   Understand these instructions.  Will watch your condition.  Will get help right away if you are not doing well or get worse. Document Released: 08/26/2005 Document Revised: 12/21/2012 Document Reviewed: 08/17/2012 [facility] Patient Information 2015 [place], [place]. This information is not intended to replace advice given to you by your health care provider. Make sure you discuss any questions you have with your health care provider.

## 2014-03-21 ENCOUNTER — Encounter (INDEPENDENT_AMBULATORY_CARE_PROVIDER_SITE_OTHER): Payer: Medicare Other | Admitting: General Surgery

## 2014-03-21 ENCOUNTER — Encounter (INDEPENDENT_AMBULATORY_CARE_PROVIDER_SITE_OTHER): Payer: Self-pay | Admitting: General Surgery

## 2014-03-21 ENCOUNTER — Encounter (INDEPENDENT_AMBULATORY_CARE_PROVIDER_SITE_OTHER): Payer: Medicare Other | Admitting: Surgery

## 2014-03-21 ENCOUNTER — Ambulatory Visit (INDEPENDENT_AMBULATORY_CARE_PROVIDER_SITE_OTHER): Payer: Medicare Other | Admitting: General Surgery

## 2014-03-21 ENCOUNTER — Other Ambulatory Visit (INDEPENDENT_AMBULATORY_CARE_PROVIDER_SITE_OTHER): Payer: Self-pay | Admitting: General Surgery

## 2014-03-21 ENCOUNTER — Telehealth (INDEPENDENT_AMBULATORY_CARE_PROVIDER_SITE_OTHER): Payer: Self-pay

## 2014-03-21 ENCOUNTER — Telehealth (INDEPENDENT_AMBULATORY_CARE_PROVIDER_SITE_OTHER): Payer: Self-pay | Admitting: *Deleted

## 2014-03-21 VITALS — BP 112/70 | HR 67 | Temp 98.0°F | Ht 71.0 in | Wt 181.0 lb

## 2014-03-21 DIAGNOSIS — R338 Other retention of urine: Secondary | ICD-10-CM

## 2014-03-21 DIAGNOSIS — R1011 Right upper quadrant pain: Secondary | ICD-10-CM | POA: Diagnosis not present

## 2014-03-21 DIAGNOSIS — R52 Pain, unspecified: Secondary | ICD-10-CM

## 2014-03-21 DIAGNOSIS — G8929 Other chronic pain: Secondary | ICD-10-CM

## 2014-03-21 DIAGNOSIS — Z09 Encounter for follow-up examination after completed treatment for conditions other than malignant neoplasm: Secondary | ICD-10-CM

## 2014-03-21 LAB — CBC
HCT: 29.8 % — ABNORMAL LOW (ref 39.0–52.0)
Hemoglobin: 10.3 g/dL — ABNORMAL LOW (ref 13.0–17.0)
MCH: 31.3 pg (ref 26.0–34.0)
MCHC: 34.6 g/dL (ref 30.0–36.0)
MCV: 90.6 fL (ref 78.0–100.0)
PLATELETS: 250 10*3/uL (ref 150–400)
RBC: 3.29 MIL/uL — ABNORMAL LOW (ref 4.22–5.81)
RDW: 14.7 % (ref 11.5–15.5)
WBC: 6.7 10*3/uL (ref 4.0–10.5)

## 2014-03-21 LAB — COMPREHENSIVE METABOLIC PANEL
ALK PHOS: 100 U/L (ref 39–117)
ALT: 17 U/L (ref 0–53)
AST: 15 U/L (ref 0–37)
Albumin: 2.9 g/dL — ABNORMAL LOW (ref 3.5–5.2)
BUN: 17 mg/dL (ref 6–23)
CO2: 26 mEq/L (ref 19–32)
CREATININE: 1.06 mg/dL (ref 0.50–1.35)
Calcium: 9.1 mg/dL (ref 8.4–10.5)
Chloride: 101 mEq/L (ref 96–112)
Glucose, Bld: 121 mg/dL — ABNORMAL HIGH (ref 70–99)
Potassium: 4.6 mEq/L (ref 3.5–5.3)
Sodium: 133 mEq/L — ABNORMAL LOW (ref 135–145)
Total Bilirubin: 0.6 mg/dL (ref 0.2–1.2)
Total Protein: 6.1 g/dL (ref 6.0–8.3)

## 2014-03-21 MED ORDER — OXYCODONE HCL 5 MG PO TABS: 5.0000 mg | ORAL_TABLET | ORAL | Status: DC | PRN

## 2014-03-21 NOTE — Telephone Encounter (Signed)
Mrs. Gullion called regarding a f/u appt with Dr. Donne Hazel.  Pt took the phone and said he already had a f/u appt on 04/14/14 but he did state that he had some acute pain in the RUQ last night but he bared it.  He said this morning the same pain was there.  It was between 8-9 on a scale of 1-10.  He is s/p acute cholelithiasis 03-17-14 and has a catheter and a drain tube in.  I advised pt to come to see Dr. Donne Hazel this afternoon in our [place] office.  Pt verbalized understanding.  Anderson Malta

## 2014-03-21 NOTE — Telephone Encounter (Signed)
[facility] to get pt an appt this week to be seen by Dr Jaquelyn Bitter for urinary retention after gallbladder surgery. I advised pt was discharged this weekend from the hospital and had to have a foley put back in before leaving the hospital along with starting on some Flomax. Shirlean Mylar will call pt directly to get the pt seen this week. Dr Donne Hazel notified so he will tell the pt today when he comes into urgent office.

## 2014-03-21 NOTE — Progress Notes (Signed)
Subjective:     Patient ID: [name] [name], male   DOB: 01/12/1929, 80 y.o.   MRN: [id number]  HPI This is an 80 year old male who had a very complicated course for cholecystitis. I took him to the operating room last week and did a laparoscopic subtotal cholecystectomy. I placed a drain. This was complicated by urinary retention for which he was discharged home with a Foley catheter in place. He's been doing well but he has had increased pain since he went home. His drainage has been putting out some serous fluid that has not been a large amount. He has no nausea vomiting. He has no fevers at all. He has not had a bowel movement but is passing flatus. He comes in today to have this checked disease had more pain.  Review of Systems     Objective:   Physical Exam Incisions all clean without infection, drain with serous fluid, minimally tender    Assessment:     Status post laparoscopic cholecystectomy     Plan:     Some of the pain may be coming from his drain I remove this today without difficulty. Also asked him to attempt some MiraLAX to have a bowel movement. I will check his labs the morning just to make sure that these were those are okay but I think that this will just get better on his own. I will give him some more pain medication I discussed with him he can take more than he is taking right now.

## 2014-03-22 ENCOUNTER — Telehealth (INDEPENDENT_AMBULATORY_CARE_PROVIDER_SITE_OTHER): Payer: Self-pay

## 2014-03-22 DIAGNOSIS — R339 Retention of urine, unspecified: Secondary | ICD-10-CM | POA: Diagnosis not present

## 2014-03-22 NOTE — Telephone Encounter (Signed)
[facility] notifying pt that his labs drawn from yesterday are all negative per Dr Donne Hazel. I advised pt that if he did not hear from [facility] to let me know b/c they told me yesterday that they would call pt directly to get him seen this week.

## 2014-03-24 ENCOUNTER — Ambulatory Visit: Payer: Medicare Other | Admitting: Neurology

## 2014-03-31 ENCOUNTER — Encounter (INDEPENDENT_AMBULATORY_CARE_PROVIDER_SITE_OTHER): Payer: Self-pay | Admitting: General Surgery

## 2014-03-31 ENCOUNTER — Ambulatory Visit (INDEPENDENT_AMBULATORY_CARE_PROVIDER_SITE_OTHER): Payer: Medicare Other | Admitting: General Surgery

## 2014-03-31 VITALS — BP 129/77 | HR 72 | Temp 97.7°F | Resp 18 | Ht 71.0 in | Wt 174.0 lb

## 2014-03-31 DIAGNOSIS — Z09 Encounter for follow-up examination after completed treatment for conditions other than malignant neoplasm: Secondary | ICD-10-CM

## 2014-03-31 NOTE — Progress Notes (Signed)
Subjective:     Patient ID: [name] [name], male   DOB: 05/05/1929, 80 y.o.   MRN: [id number]  HPI This is an 80 year old male who had a very complicated course for cholecystitis. I took him to the operating room last week and did a laparoscopic subtotal cholecystectomy. I placed a drain. This was complicated by urinary retention for which he was discharged home with a Foley catheter in place. His Foley has now been removed and he is voiding. I took his drain out last week as he was having a lot of pain and it wasn't putting out anything concerning. He feels better since then. He feels like he is slowly getting better although his appetite is not normal. He does have some nausea. This is better when he is taking Nexium.   Review of Systems     Objective:   Physical Exam Healing incisions without any evidence of infection, abdomen is soft and nontender    Assessment:     Status post laparoscopic cholecystectomy     Plan:     I think he has a normal course for him. He went into this fairly ill with an abscess. I think it will just take some time to get better. I told him I think his appetite will improve slowly over some time.  He will continue to take his Nexium also. I'll see him back in 3 or 4 weeks asked to call me sooner if he had any issues before then.

## 2014-04-04 ENCOUNTER — Encounter (INDEPENDENT_AMBULATORY_CARE_PROVIDER_SITE_OTHER): Payer: Medicare Other | Admitting: General Surgery

## 2014-04-09 LAB — HM DIABETES EYE EXAM

## 2014-04-14 ENCOUNTER — Encounter (INDEPENDENT_AMBULATORY_CARE_PROVIDER_SITE_OTHER): Payer: Self-pay | Admitting: General Surgery

## 2014-04-14 ENCOUNTER — Ambulatory Visit (INDEPENDENT_AMBULATORY_CARE_PROVIDER_SITE_OTHER): Payer: Medicare Other | Admitting: General Surgery

## 2014-04-14 VITALS — BP 120/68 | HR 74 | Resp 18 | Ht 71.0 in | Wt 169.0 lb

## 2014-04-14 DIAGNOSIS — R110 Nausea: Secondary | ICD-10-CM

## 2014-04-14 DIAGNOSIS — Z09 Encounter for follow-up examination after completed treatment for conditions other than malignant neoplasm: Secondary | ICD-10-CM

## 2014-04-14 MED ORDER — ONDANSETRON HCL 4 MG PO TABS: 4.0000 mg | ORAL_TABLET | Freq: Three times a day (TID) | ORAL | Status: DC | PRN

## 2014-04-14 NOTE — Progress Notes (Signed)
Subjective:     Patient ID: [name] [name], male   DOB: 04/22/1929, 80 y.o.   MRN: [id number]  HPI  This is an 80 year old male who has had very complicated cholecystitis. He recently has undergone a laparoscopic cholecystectomy which he overall has done fairly well from. He returns today though he is still having nausea which is preventing him from E. He has no fevers. He has no emesis. He was taking Nexium previously and this appeared to help but this is not really helping anymore. He is having bowel movements. Review of Systems     Objective:   Physical Exam Abdomen is soft and nontender, incisions were all well healing    Assessment:     Status post laparoscopic cholecystectomy Nausea     Plan:     I felt that this would resolve on its own as it appeared to be getting better just with Nexium. I think the nausea is actually preventing him from moving forward at this point. I do not think he has an intra-abdominal infection or process going on as the source of this. I gave him a prescription for some Zofran today and go and asked Dr. Ardis Hughs to see him to evaluate him also. I will plan on seeing him back in the next couple of months again.

## 2014-04-14 NOTE — Addendum Note (Signed)
Addended by: Illene Regulus on: 04/14/2014 02:38 PM   Modules accepted: Orders

## 2014-04-18 ENCOUNTER — Encounter: Payer: Self-pay | Admitting: Family Medicine

## 2014-04-18 ENCOUNTER — Ambulatory Visit (INDEPENDENT_AMBULATORY_CARE_PROVIDER_SITE_OTHER): Payer: Medicare Other | Admitting: Family Medicine

## 2014-04-18 VITALS — BP 142/72 | HR 83 | Temp 97.4°F | Wt 171.5 lb

## 2014-04-18 DIAGNOSIS — I2510 Atherosclerotic heart disease of native coronary artery without angina pectoris: Secondary | ICD-10-CM

## 2014-04-18 DIAGNOSIS — N3000 Acute cystitis without hematuria: Secondary | ICD-10-CM | POA: Diagnosis not present

## 2014-04-18 DIAGNOSIS — R300 Dysuria: Secondary | ICD-10-CM | POA: Diagnosis not present

## 2014-04-18 LAB — POCT URINALYSIS DIPSTICK
Bilirubin, UA: NEGATIVE
Blood, UA: NEGATIVE
Glucose, UA: NEGATIVE
KETONES UA: NEGATIVE
Nitrite, UA: NEGATIVE
PH UA: 6.5
Protein, UA: 15
SPEC GRAV UA: 1.015
Urobilinogen, UA: 0.2

## 2014-04-18 MED ORDER — CIPROFLOXACIN HCL 250 MG PO TABS: 250.0000 mg | ORAL_TABLET | Freq: Two times a day (BID) | ORAL | Status: DC

## 2014-04-18 NOTE — Progress Notes (Signed)
Pre visit review using our clinic review tool, if applicable. No additional management support is needed unless otherwise documented below in the visit note.  Now s/p cholecystectomy with abscess drainage with h/o urinary retention needing a foley, now with all hardware/lines removed.    He had a period of time after the catheter was out that his urination was back to normal, w/o dysuria.  Now with about 1 week of burning with urination.  No blood seen in urine.  No FCNAVD.  No testicle pain.  No abd pain.  Nausea is resolved.    Meds, vitals, and allergies reviewed.   ROS: See HPI.  Otherwise, noncontributory.  nad ncat Mmm rrr ctab Abd soft, not ttp Ext w/o edema

## 2014-04-18 NOTE — Patient Instructions (Signed)
Drink plenty of water and start the antibiotics today.  We'll contact you with your lab report.  Take care.  Glad to see you.  

## 2014-04-19 DIAGNOSIS — N390 Urinary tract infection, site not specified: Secondary | ICD-10-CM | POA: Insufficient documentation

## 2014-04-19 LAB — URINE CULTURE
Colony Count: NO GROWTH
Organism ID, Bacteria: NO GROWTH

## 2014-04-19 NOTE — Assessment & Plan Note (Signed)
With pyuria, dysuria.  Start cipro and check ucx.  See notes on labs.  D/w pt.  Nontoxic.  He agrees. Okay for outpatient f/u.

## 2014-04-20 ENCOUNTER — Telehealth (INDEPENDENT_AMBULATORY_CARE_PROVIDER_SITE_OTHER): Payer: Self-pay

## 2014-04-20 NOTE — Telephone Encounter (Signed)
Message copied by Illene Regulus on Wed Apr 20, 2014  4:58 PM ------      Message from: Humphrey Rolls K      Created: Thu Apr 14, 2014 11:27 AM      Regarding: Dr Donne Hazel      Contact: [phone]       Needs 4 wk appt around (05/12/14). May call [name] (daug) @ [phone] ------

## 2014-04-20 NOTE — Telephone Encounter (Signed)
LMOM giving pt's f/u appt for 9/11 arrive at 11:15/11:30. We are pushing the f/u appt out just one more week when he is due per Dr Donne Hazel b/c of appt time spots.

## 2014-05-20 ENCOUNTER — Encounter (INDEPENDENT_AMBULATORY_CARE_PROVIDER_SITE_OTHER): Payer: Medicare Other | Admitting: General Surgery

## 2014-05-22 ENCOUNTER — Other Ambulatory Visit: Payer: Self-pay | Admitting: Family Medicine

## 2014-06-20 ENCOUNTER — Other Ambulatory Visit: Payer: Self-pay | Admitting: Family Medicine

## 2014-06-30 DIAGNOSIS — L821 Other seborrheic keratosis: Secondary | ICD-10-CM | POA: Diagnosis not present

## 2014-06-30 DIAGNOSIS — L820 Inflamed seborrheic keratosis: Secondary | ICD-10-CM | POA: Diagnosis not present

## 2014-06-30 DIAGNOSIS — D229 Melanocytic nevi, unspecified: Secondary | ICD-10-CM | POA: Diagnosis not present

## 2014-06-30 DIAGNOSIS — Z1283 Encounter for screening for malignant neoplasm of skin: Secondary | ICD-10-CM | POA: Diagnosis not present

## 2014-06-30 DIAGNOSIS — L578 Other skin changes due to chronic exposure to nonionizing radiation: Secondary | ICD-10-CM | POA: Diagnosis not present

## 2014-06-30 DIAGNOSIS — D1800 Hemangioma unspecified site: Secondary | ICD-10-CM | POA: Diagnosis not present

## 2014-07-19 ENCOUNTER — Other Ambulatory Visit: Payer: Self-pay | Admitting: Family Medicine

## 2014-08-03 ENCOUNTER — Other Ambulatory Visit: Payer: Self-pay | Admitting: Family Medicine

## 2014-09-13 ENCOUNTER — Other Ambulatory Visit: Payer: Self-pay

## 2014-09-13 MED ORDER — TAMSULOSIN HCL 0.4 MG PO CAPS: 0.4000 mg | ORAL_CAPSULE | Freq: Every day | ORAL | Status: DC

## 2014-09-13 NOTE — Telephone Encounter (Signed)
Pt comes to office requesting refill tamsulosin; pt said he has been taking one daily; pt has CPX on 10/28/14. Advised will refill until appt and then pt will update refills with provider.

## 2014-09-29 ENCOUNTER — Other Ambulatory Visit: Payer: Self-pay | Admitting: Family Medicine

## 2014-10-14 ENCOUNTER — Other Ambulatory Visit: Payer: Self-pay | Admitting: Family Medicine

## 2014-10-23 ENCOUNTER — Other Ambulatory Visit: Payer: Self-pay | Admitting: Family Medicine

## 2014-10-23 DIAGNOSIS — E119 Type 2 diabetes mellitus without complications: Secondary | ICD-10-CM

## 2014-10-23 DIAGNOSIS — Z125 Encounter for screening for malignant neoplasm of prostate: Secondary | ICD-10-CM

## 2014-10-23 DIAGNOSIS — E038 Other specified hypothyroidism: Secondary | ICD-10-CM

## 2014-10-23 DIAGNOSIS — M109 Gout, unspecified: Secondary | ICD-10-CM

## 2014-10-25 ENCOUNTER — Other Ambulatory Visit (INDEPENDENT_AMBULATORY_CARE_PROVIDER_SITE_OTHER): Payer: Medicare Other

## 2014-10-25 DIAGNOSIS — M109 Gout, unspecified: Secondary | ICD-10-CM | POA: Diagnosis not present

## 2014-10-25 DIAGNOSIS — E038 Other specified hypothyroidism: Secondary | ICD-10-CM | POA: Diagnosis not present

## 2014-10-25 DIAGNOSIS — Z125 Encounter for screening for malignant neoplasm of prostate: Secondary | ICD-10-CM | POA: Diagnosis not present

## 2014-10-25 DIAGNOSIS — E119 Type 2 diabetes mellitus without complications: Secondary | ICD-10-CM | POA: Diagnosis not present

## 2014-10-25 LAB — COMPREHENSIVE METABOLIC PANEL
ALT: 20 U/L (ref 0–53)
AST: 22 U/L (ref 0–37)
Albumin: 3.4 g/dL — ABNORMAL LOW (ref 3.5–5.2)
Alkaline Phosphatase: 92 U/L (ref 39–117)
BILIRUBIN TOTAL: 0.7 mg/dL (ref 0.2–1.2)
BUN: 24 mg/dL — ABNORMAL HIGH (ref 6–23)
CO2: 29 mEq/L (ref 19–32)
CREATININE: 1.3 mg/dL (ref 0.40–1.50)
Calcium: 9.5 mg/dL (ref 8.4–10.5)
Chloride: 105 mEq/L (ref 96–112)
GFR: 55.93 mL/min — AB (ref >60.00)
GLUCOSE: 145 mg/dL — AB (ref 70–99)
Potassium: 4.9 mEq/L (ref 3.5–5.1)
SODIUM: 139 meq/L (ref 135–145)
TOTAL PROTEIN: 6.8 g/dL (ref 6.0–8.3)

## 2014-10-25 LAB — LDL CHOLESTEROL, DIRECT: LDL DIRECT: 82 mg/dL

## 2014-10-25 LAB — LIPID PANEL
Cholesterol: 157 mg/dL (ref 0–200)
HDL: 44.8 mg/dL (ref >39.00)
NONHDL: 112.2
Total CHOL/HDL Ratio: 4
Triglycerides: 206 mg/dL — ABNORMAL HIGH (ref 0.0–149.0)
VLDL: 41.2 mg/dL — AB (ref 0.0–40.0)

## 2014-10-25 LAB — PSA, MEDICARE: PSA: 2.66 ng/ml (ref 0.10–4.00)

## 2014-10-25 LAB — URIC ACID: Uric Acid, Serum: 5.4 mg/dL (ref 4.0–7.8)

## 2014-10-25 LAB — HEMOGLOBIN A1C: HEMOGLOBIN A1C: 6.5 % (ref 4.6–6.5)

## 2014-10-25 LAB — TSH: TSH: 5.09 u[IU]/mL — ABNORMAL HIGH (ref 0.35–4.50)

## 2014-10-28 ENCOUNTER — Ambulatory Visit (INDEPENDENT_AMBULATORY_CARE_PROVIDER_SITE_OTHER): Payer: Medicare Other | Admitting: Family Medicine

## 2014-10-28 ENCOUNTER — Encounter: Payer: Self-pay | Admitting: Family Medicine

## 2014-10-28 VITALS — BP 132/82 | HR 62 | Temp 97.9°F | Ht 71.0 in | Wt 184.8 lb

## 2014-10-28 DIAGNOSIS — Z23 Encounter for immunization: Secondary | ICD-10-CM

## 2014-10-28 DIAGNOSIS — Z0000 Encounter for general adult medical examination without abnormal findings: Secondary | ICD-10-CM

## 2014-10-28 DIAGNOSIS — R251 Tremor, unspecified: Secondary | ICD-10-CM | POA: Diagnosis not present

## 2014-10-28 DIAGNOSIS — E785 Hyperlipidemia, unspecified: Secondary | ICD-10-CM | POA: Diagnosis not present

## 2014-10-28 DIAGNOSIS — R7989 Other specified abnormal findings of blood chemistry: Secondary | ICD-10-CM

## 2014-10-28 DIAGNOSIS — E119 Type 2 diabetes mellitus without complications: Secondary | ICD-10-CM | POA: Diagnosis not present

## 2014-10-28 DIAGNOSIS — E038 Other specified hypothyroidism: Secondary | ICD-10-CM

## 2014-10-28 DIAGNOSIS — G252 Other specified forms of tremor: Secondary | ICD-10-CM

## 2014-10-28 DIAGNOSIS — I10 Essential (primary) hypertension: Secondary | ICD-10-CM

## 2014-10-28 DIAGNOSIS — G250 Essential tremor: Secondary | ICD-10-CM

## 2014-10-28 DIAGNOSIS — M109 Gout, unspecified: Secondary | ICD-10-CM | POA: Diagnosis not present

## 2014-10-28 DIAGNOSIS — Z8546 Personal history of malignant neoplasm of prostate: Secondary | ICD-10-CM

## 2014-10-28 DIAGNOSIS — Z7189 Other specified counseling: Secondary | ICD-10-CM

## 2014-10-28 MED ORDER — PRIMIDONE 50 MG PO TABS: 25.0000 mg | ORAL_TABLET | Freq: Every day | ORAL | Status: DC

## 2014-10-28 MED ORDER — ALLOPURINOL 300 MG PO TABS: 300.0000 mg | ORAL_TABLET | Freq: Every day | ORAL | Status: DC

## 2014-10-28 MED ORDER — LISINOPRIL 20 MG PO TABS: 20.0000 mg | ORAL_TABLET | Freq: Every day | ORAL | Status: DC

## 2014-10-28 MED ORDER — TAMSULOSIN HCL 0.4 MG PO CAPS: 0.4000 mg | ORAL_CAPSULE | Freq: Every day | ORAL | Status: DC

## 2014-10-28 MED ORDER — ATORVASTATIN CALCIUM 40 MG PO TABS: 40.0000 mg | ORAL_TABLET | Freq: Every day | ORAL | Status: DC

## 2014-10-28 MED ORDER — ESOMEPRAZOLE MAGNESIUM 20 MG PO CPDR: 20.0000 mg | DELAYED_RELEASE_CAPSULE | Freq: Every day | ORAL | Status: DC

## 2014-10-28 MED ORDER — CARVEDILOL PHOSPHATE ER 20 MG PO CP24: 20.0000 mg | ORAL_CAPSULE | Freq: Every day | ORAL | Status: DC

## 2014-10-28 NOTE — Patient Instructions (Addendum)
Call about an appointment with cardiology for the summer of 2016.  Recheck labs here in about 6 months.  Check with your insurance to see if they will cover the shingles shot. I would get a flu shot each fall.   Take care.  Glad to see you.   Recheck yearly, sooner if needed.

## 2014-10-28 NOTE — Progress Notes (Signed)
Pre visit review using our clinic review tool, if applicable. No additional management support is needed unless otherwise documented below in the visit note.  I have personally reviewed the Medicare Annual Wellness questionnaire and have noted 1. The patient's medical and social history 2. Their use of alcohol, tobacco or illicit drugs 3. Their current medications and supplements 4. The patient's functional ability including ADL's, fall risks, home safety risks and hearing or visual             impairment. 5. Diet and physical activities 6. Evidence for depression or mood disorders  The patients weight, height, BMI have been recorded in the chart and visual acuity is per eye clinic.  I have made referrals, counseling and provided education to the patient based review of the above and I have provided the pt with a written personalized care plan for preventive services.  Provider list updated- see scanned forms.  Routine anticipatory guidance given to patient.  See health maintenance. Flu done 2015 Shingles d/w pt PNA 2006 Tetanus  2013 Colon cancer screening not indicated due to age.  He agrees.  Prostate cancer screening- PSA done 2016 Advance directive- wife designated if patient were incapacitated.  Cognitive function addressed- see scanned forms- and if abnormal then additional documentation follows.   Hypertension:    Using medication without problems or lightheadedness: yes Chest pain with exertion:no Edema:no Short of breath:no  Elevated Cholesterol: Using medications without problems:yes Muscle aches: no Diet compliance: yes Exercise:yes  Tremor controlled enough not to change meds at this point.  Compliant with primidone.    DM2- no meds.  No sx of low or high sugars.  Working on diet.  Eye exam done ~04/09/14 per patient.   Gout.  No flares.  No ADE on meds.  Doing well.   PMH and SH reviewed  Meds, vitals, and allergies reviewed.   ROS: See HPI.  Otherwise  negative.    GEN: nad, alert and oriented HEENT: mucous membranes moist NECK: supple w/o LA CV: rrr with rate ectopy on exam PULM: ctab, no inc wob ABD: soft, +bs EXT: no edema SKIN: no acute rash  Diabetic foot exam: Normal inspection No skin breakdown No calluses  Normal DP pulses Normal sensation to light touch and monofilament Nails normal

## 2014-10-30 DIAGNOSIS — Z7189 Other specified counseling: Secondary | ICD-10-CM | POA: Insufficient documentation

## 2014-10-30 DIAGNOSIS — Z0000 Encounter for general adult medical examination without abnormal findings: Secondary | ICD-10-CM | POA: Insufficient documentation

## 2014-10-30 NOTE — Assessment & Plan Note (Signed)
Provider list updated- see scanned forms.  Routine anticipatory guidance given to patient.  See health maintenance. Flu done 2015 Shingles d/w pt PNA 2006 Tetanus  2013 Colon cancer screening not indicated due to age.  He agrees.  Prostate cancer screening- PSA done 2016 Advance directive- wife designated if patient were incapacitated.  Cognitive function addressed- see scanned forms- and if abnormal then additional documentation follows.

## 2014-10-30 NOTE — Assessment & Plan Note (Signed)
Not bothersome enough to intervene o/w, he'll notify me as needed.

## 2014-10-30 NOTE — Assessment & Plan Note (Signed)
Controlled on recheck, no change in meds.

## 2014-10-30 NOTE — Assessment & Plan Note (Signed)
No flares, doing well, continue as is.

## 2014-10-30 NOTE — Assessment & Plan Note (Signed)
PSA stable. D/w pt.

## 2014-10-30 NOTE — Assessment & Plan Note (Signed)
Tolerating statin, continue D&E, recheck yearly. Labs d/w pt.

## 2014-10-30 NOTE — Assessment & Plan Note (Signed)
Mild TSH elevation, no tmg on exam,  Recheck TSH in about 6 months.  He agrees.

## 2014-10-30 NOTE — Assessment & Plan Note (Signed)
Controlled off meds.  Continue as is ie off meds, he'll work on diet for sugar and TG.  Recheck in 6 months.

## 2014-11-08 ENCOUNTER — Other Ambulatory Visit: Payer: Self-pay | Admitting: Family Medicine

## 2014-11-30 IMAGING — XA IR FISTULA/SINUS TRACT
1 series · 4 of 4 positions shown · IV contrast (omnipaque)
Comparison: none

CLINICAL DATA: Cholecystitis, intra-abdominal abscess, status post
diagnostic laparoscopy with abscess drain insertion

EXAM:
SINUS TRACT INJECTION/FISTULOGRAM
ANESTHESIA/SEDATION:
None.
MEDICATIONS:
CONTRAST:  5mL OMNIPAQUE IOHEXOL 300 MG/ML  SOLN
PROCEDURE:
Under fluoroscopy, contrast ingestion was performed of the existing
right abdominal abscess drain
COMPLICATIONS:
None immediate

[Series 1: run · 4 of 4 slices shown]
[im 1/4]
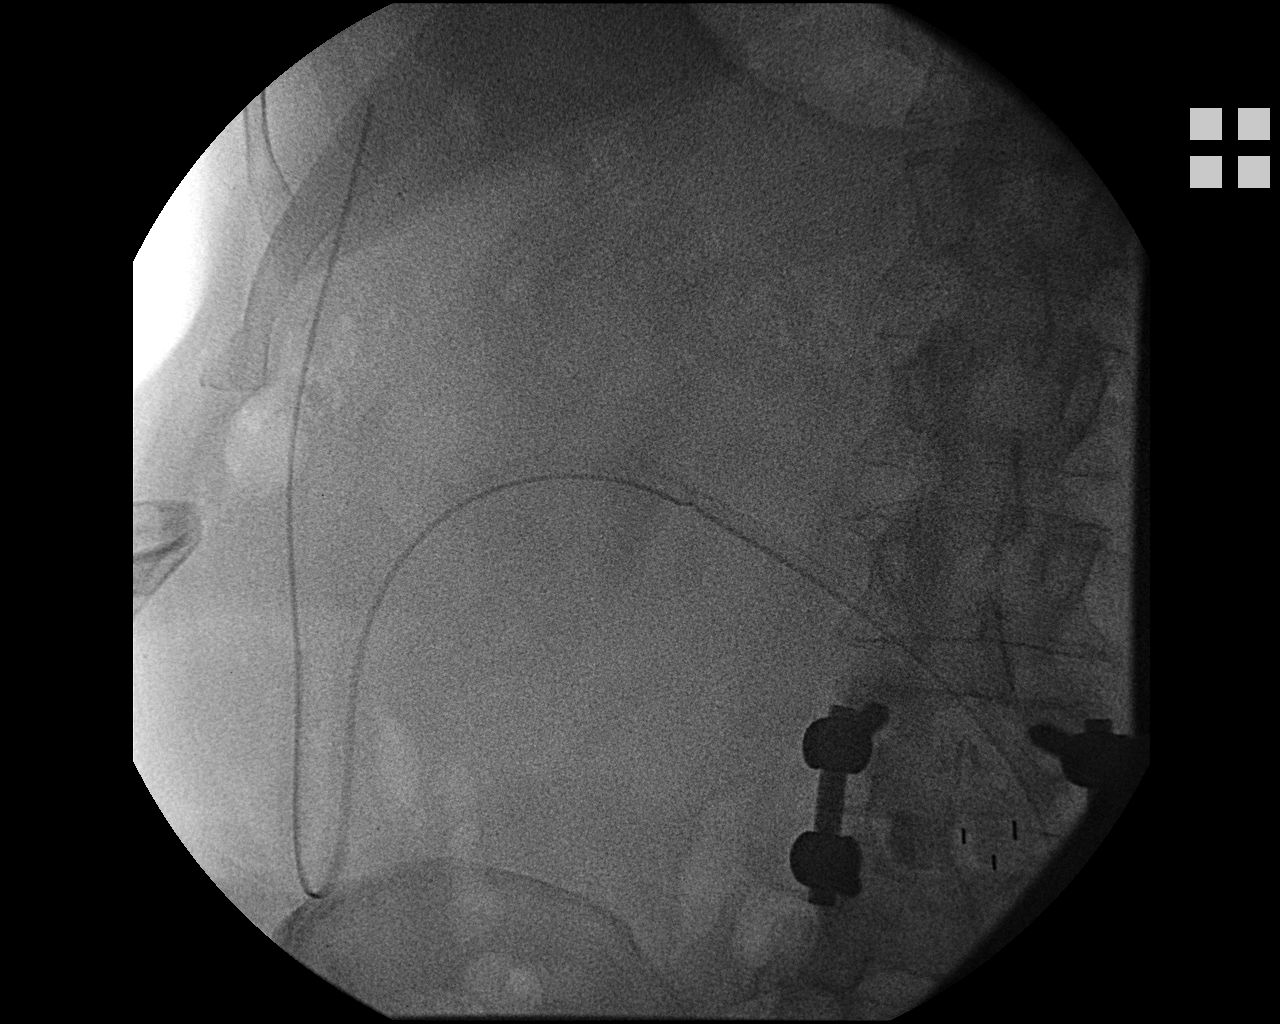
[im 2/4]
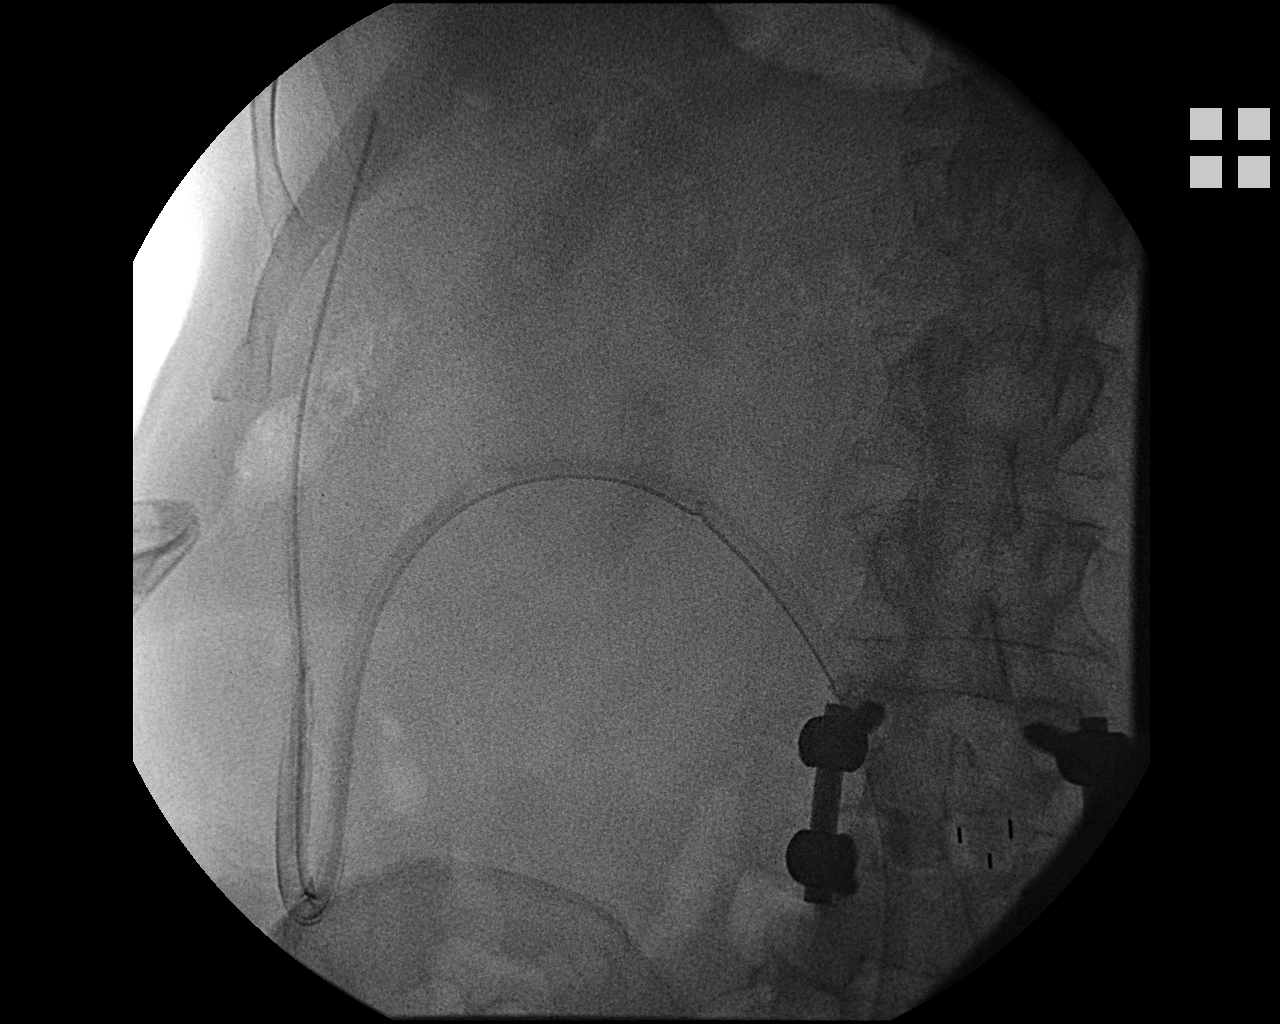
[im 3/4]
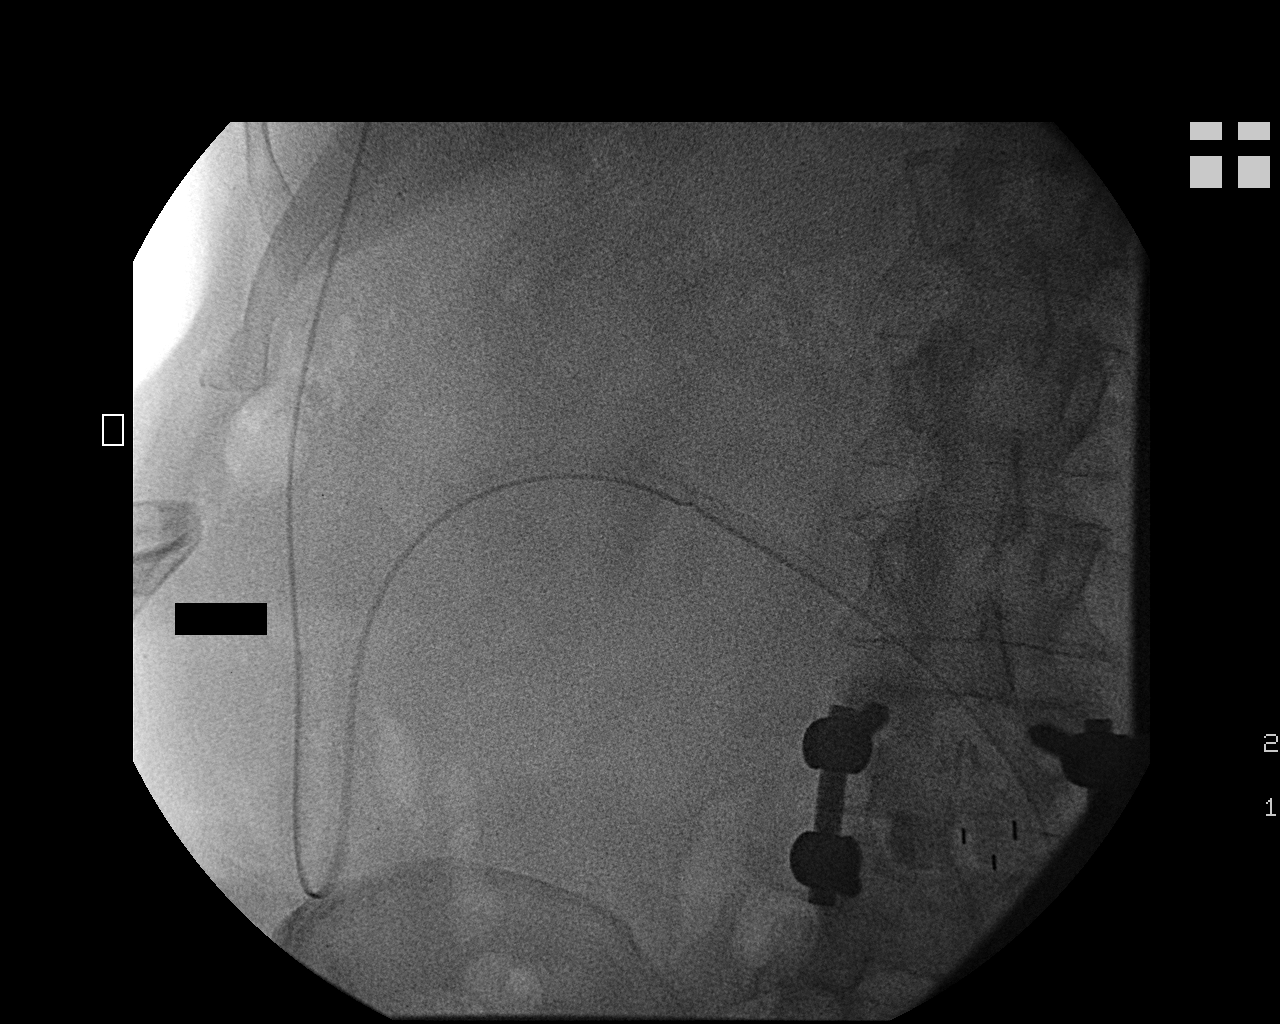
[im 4/4]
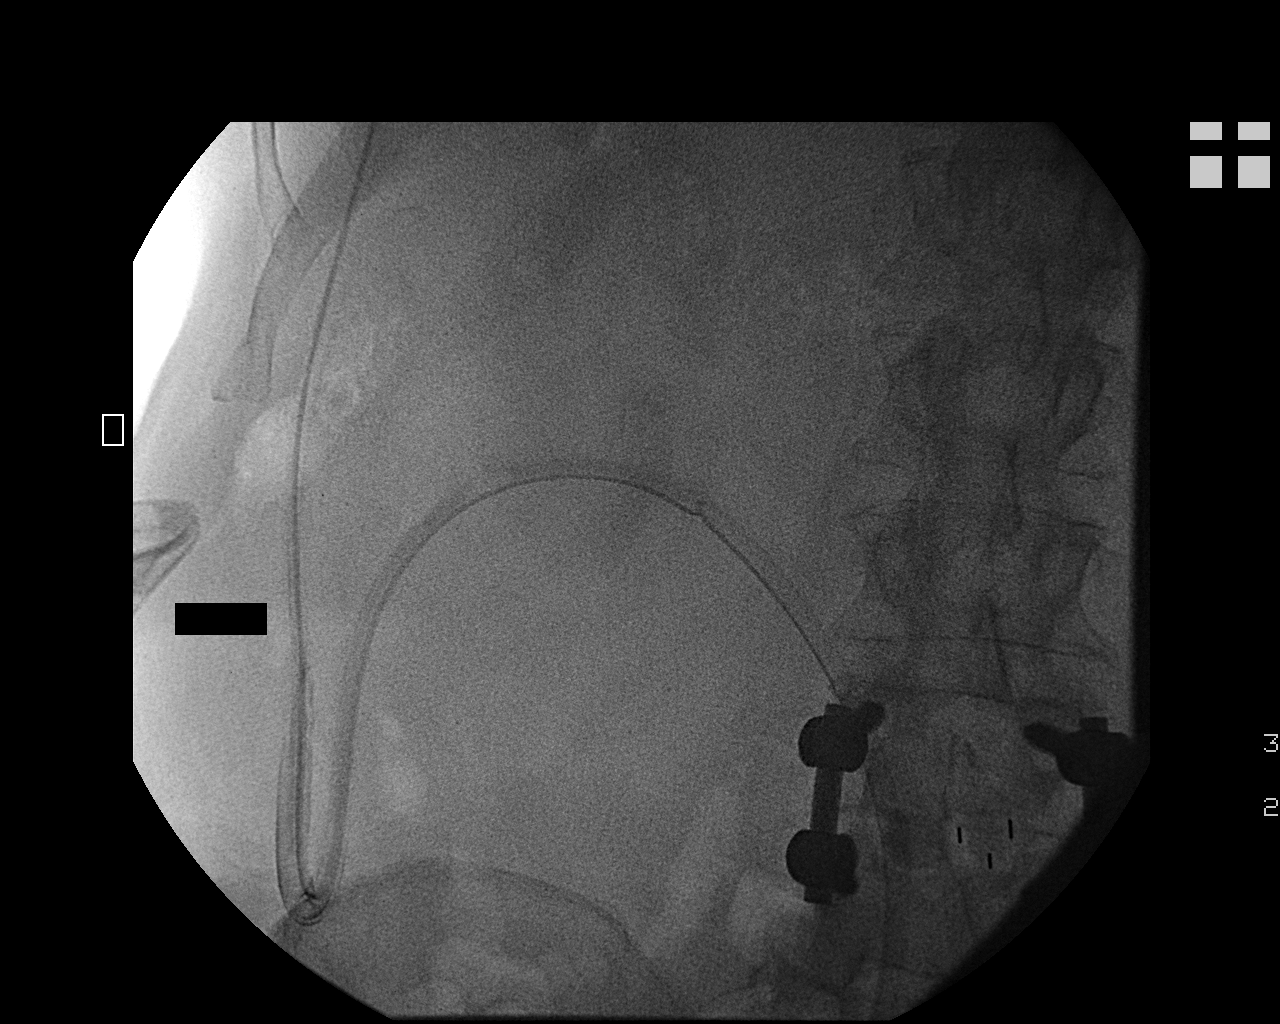

[4 of 4 positions shown; findings below may reference images not displayed]

FINDINGS: Right abdominal abscess drain was localized under fluoroscopy.
Attempts were made to inject the drain catheter however the catheter
is occluded. The catheter could not be flushed with saline or
contrast.
IMPRESSION: Right abdominal abscess drain is occluded.

These results were called by telephone at the time of interpretation
on 01/12/2014 at [DATE] to Dr. TSEHAY MORONEY , who verbally
acknowledged these results.

## 2015-02-10 ENCOUNTER — Telehealth: Payer: Self-pay | Admitting: Cardiovascular Disease

## 2015-02-10 NOTE — Telephone Encounter (Signed)
New Message  Pt c/o of dizziness, mostly occuring after activity. Pt requests to speak w/ RN, Please call back and discuss.

## 2015-02-10 NOTE — Telephone Encounter (Signed)
Patient is not dizziness at this time. Patient is wanting an appointment with Dr. Johnsie Cancel. The earliest appointment is in August. Asked patient if he wanted to see a PA or NP. Patient refused. Will forward this message to Altha Harm his nurse and also send message to scheduling. Patient verbalized understanding.

## 2015-02-15 NOTE — Telephone Encounter (Signed)
LM TO CALL BACK ./CY 

## 2015-02-22 NOTE — Telephone Encounter (Signed)
LM THAT  APPT  HAS  BEEN  MADE FOR  AUGUST  AND  IF NEEDS  EARLIER APPT   MAY  SCHEDULE WITH   PA  OR NP  WILL AWAIT  CALL BACK FROM  PT .[name] [name]

## 2015-04-27 ENCOUNTER — Ambulatory Visit: Payer: Medicare Other | Admitting: Cardiovascular Disease

## 2015-04-29 NOTE — Progress Notes (Signed)
Patient ID: [name] [name], male   DOB: [birth year], 81 y.o.   MRN: [id number] 81 y.o.  Last seen in 2014 after moving from [place]  Had CABG in 2006 by Dr Darcey Nora. Last myovue in 2007. No recent problems with SSCP, dyspnea or palpitations. Compliant with meds. Recent diagnosis of DM. Has increasing lower back pain that limits mobility. CRF;s otherwise HTN and elevated lipids. Has BP cuff at home but not sure it is accurate. Had been running around 935 systolic Compliant with meds. Limited exercise.   Discussed increasing lisinopril to 40mg   BP's running 701 systolic at home.  Needs glaucoma surgery and also teeth worked on . Does not remember his dentist or eye doctors name  He is clear to have both systems worked on   Some dizzyness with exertion postural.  Previous anginal symptoms dyspnea Also some fatigue   ROS: Denies fever, malais, weight loss, blurry vision, decreased visual acuity, cough, sputum, SOB, hemoptysis, pleuritic pain, palpitaitons, heartburn, abdominal pain, melena, lower extremity edema, claudication, or rash.  All other systems reviewed and negative  General: Affect appropriate Healthy:  appears stated age 19: normal Neck supple with no adenopathy JVP normal no bruits no thyromegaly Lungs clear with no wheezing and good diaphragmatic motion Heart:  S1/S2 no murmur, no rub, gallop or click PMI normal Abdomen: benighn, BS positve, no tenderness, no AAA no bruit.  No HSM or HJR Distal pulses intact with no bruits No edema Neuro non-focal Skin warm and dry No muscular weakness   Current Outpatient Prescriptions  Medication Sig Dispense Refill  . allopurinol (ZYLOPRIM) 300 MG tablet Take 1 tablet (300 mg total) by mouth daily. 90 tablet 3  . aspirin 81 MG tablet Take 81 mg by mouth daily.    Marland Kitchen atorvastatin (LIPITOR) 40 MG tablet Take 1 tablet (40 mg total) by mouth daily. 90 tablet 3  . carvedilol (COREG CR) 20 MG 24 hr capsule Take 1 capsule (20 mg  total) by mouth daily. 90 capsule 3  . esomeprazole (NEXIUM) 20 MG capsule Take 20 mg by mouth daily as needed (indigestion).    . feeding supplement, ENSURE COMPLETE, (ENSURE COMPLETE) LIQD Take 237 mLs by mouth 2 (two) times daily between meals.    Marland Kitchen lisinopril (PRINIVIL,ZESTRIL) 20 MG tablet Take 1 tablet (20 mg total) by mouth daily. 90 tablet 3  . primidone (MYSOLINE) 50 MG tablet Take 0.5 tablets (25 mg total) by mouth daily. 45 tablet 3  . tamsulosin (FLOMAX) 0.4 MG CAPS capsule Take 1 capsule (0.4 mg total) by mouth daily. 90 capsule 3   No current facility-administered medications for this visit.    Allergies  Sulfonamide derivatives  Electrocardiogram:  SR rate 64 LAD RBBB 2014   05/01/15  SR rate 60  PR 214  RBBB  Assessment and Plan CAD/CABG:  F/u myovue will check EF with gated SPECT as he has fatigue HTN: Well controlled.  Continue current medications and low sodium Dash type diet.   Tolerate Slightly higher systolic as he is postural  Chol:  Cholesterol is at goal.  Continue current dose of statin and diet Rx.  No myalgias or side effects.  F/U  LFT's in 6 months. Lab Results  Component Value Date   LDLCALC 75 10/14/2013             Prostate CA   Lab Results  Component Value Date   PSA 2.66 10/25/2014   PSA 2.74 10/14/2013   PSA 2.58 10/19/2012  Tremor  Continue mysoline  Not apparent on exam today   Ordered :  Myovue F/U with me in a year if normal

## 2015-05-01 ENCOUNTER — Ambulatory Visit (INDEPENDENT_AMBULATORY_CARE_PROVIDER_SITE_OTHER): Payer: Medicare Other | Admitting: Cardiovascular Disease

## 2015-05-01 ENCOUNTER — Encounter: Payer: Self-pay | Admitting: Cardiovascular Disease

## 2015-05-01 VITALS — BP 150/70 | HR 60 | Ht 71.0 in | Wt 192.0 lb

## 2015-05-01 DIAGNOSIS — I2510 Atherosclerotic heart disease of native coronary artery without angina pectoris: Secondary | ICD-10-CM

## 2015-05-01 DIAGNOSIS — H524 Presbyopia: Secondary | ICD-10-CM | POA: Diagnosis not present

## 2015-05-01 DIAGNOSIS — R0600 Dyspnea, unspecified: Secondary | ICD-10-CM

## 2015-05-01 DIAGNOSIS — H34212 Partial retinal artery occlusion, left eye: Secondary | ICD-10-CM | POA: Diagnosis not present

## 2015-05-01 DIAGNOSIS — H35371 Puckering of macula, right eye: Secondary | ICD-10-CM | POA: Diagnosis not present

## 2015-05-01 DIAGNOSIS — H2511 Age-related nuclear cataract, right eye: Secondary | ICD-10-CM | POA: Diagnosis not present

## 2015-05-01 DIAGNOSIS — E119 Type 2 diabetes mellitus without complications: Secondary | ICD-10-CM | POA: Diagnosis not present

## 2015-05-01 NOTE — Patient Instructions (Signed)
Medication Instructions:  NO CHANGES  Labwork: NONE  Testing/Procedures: Your physician has requested that you have a lexiscan myoview. For further information please visit [email or web address]. Please follow instruction sheet, as given.   Follow-Up: Your physician wants you to follow-up in: [place] will receive a reminder letter in the mail two months in advance. If you don't receive a letter, please call our office to schedule the follow-up appointment.  Any Other Special Instructions Will Be Listed Below (If Applicable).

## 2015-05-03 ENCOUNTER — Telehealth (HOSPITAL_COMMUNITY): Payer: Self-pay

## 2015-05-03 NOTE — Telephone Encounter (Signed)
Patient given detailed instructions per Myocardial Perfusion Study Information Sheet for test on 05-08-2015 at 0745. Patient Notified to arrive 15 minutes early, and that it is imperative to arrive on time for appointment to keep from having the test rescheduled. Patient verbalized understanding. Oletta Lamas, Stephaine Breshears A

## 2015-05-08 ENCOUNTER — Ambulatory Visit (HOSPITAL_COMMUNITY): Payer: Medicare Other | Attending: Cardiovascular Disease

## 2015-05-08 DIAGNOSIS — I10 Essential (primary) hypertension: Secondary | ICD-10-CM | POA: Insufficient documentation

## 2015-05-08 DIAGNOSIS — R9439 Abnormal result of other cardiovascular function study: Secondary | ICD-10-CM | POA: Insufficient documentation

## 2015-05-08 DIAGNOSIS — R0600 Dyspnea, unspecified: Secondary | ICD-10-CM | POA: Diagnosis not present

## 2015-05-08 DIAGNOSIS — R42 Dizziness and giddiness: Secondary | ICD-10-CM | POA: Insufficient documentation

## 2015-05-08 DIAGNOSIS — Z8249 Family history of ischemic heart disease and other diseases of the circulatory system: Secondary | ICD-10-CM | POA: Insufficient documentation

## 2015-05-08 DIAGNOSIS — I4510 Unspecified right bundle-branch block: Secondary | ICD-10-CM | POA: Diagnosis not present

## 2015-05-08 DIAGNOSIS — E119 Type 2 diabetes mellitus without complications: Secondary | ICD-10-CM | POA: Diagnosis not present

## 2015-05-08 DIAGNOSIS — I2510 Atherosclerotic heart disease of native coronary artery without angina pectoris: Secondary | ICD-10-CM | POA: Insufficient documentation

## 2015-05-08 LAB — MYOCARDIAL PERFUSION IMAGING
CHL CUP NUCLEAR SDS: 3
CSEPPHR: 67 {beats}/min
LV dias vol: 98 mL
LVSYSVOL: 41 mL
RATE: 0.31
Rest HR: 59 {beats}/min
SRS: 5
SSS: 7
TID: 1.03

## 2015-05-08 MED ORDER — REGADENOSON 0.4 MG/5ML IV SOLN
0.4000 mg | Freq: Once | INTRAVENOUS | Status: AC
  Administered 2015-05-08: 0.4 mg via INTRAVENOUS

## 2015-05-08 MED ORDER — TECHNETIUM TC 99M SESTAMIBI GENERIC - CARDIOLITE
10.2000 | Freq: Once | INTRAVENOUS | Status: AC | PRN
  Administered 2015-05-08: 10 via INTRAVENOUS

## 2015-05-08 MED ORDER — TECHNETIUM TC 99M SESTAMIBI GENERIC - CARDIOLITE
32.8000 | Freq: Once | INTRAVENOUS | Status: AC | PRN
  Administered 2015-05-08: 32.8 via INTRAVENOUS

## 2015-05-09 NOTE — Progress Notes (Signed)
Patient ID: [name] [name], male   DOB: 1929/05/12, 81 y.o.   MRN: [id number] 81 y.o.  Last seen in 2014 after moving from [place]  Had CABG in 2006 by Dr Darcey Nora. Last myovue in 2007. No recent problems with SSCP, dyspnea or palpitations. Compliant with meds. Recent diagnosis of DM. Has increasing lower back pain that limits mobility. CRF;s otherwise HTN and elevated lipids. Has BP cuff at home but not sure it is accurate. Had been running around 409 systolic Compliant with meds. Limited exercise.   Discussed increasing lisinopril to 40mg   BP's running 811 systolic at home.  Needs glaucoma surgery and also teeth worked on . Does not remember his dentist or eye doctors name  He is clear to have both systems worked on   Some dizzyness with exertion postural.  Previous anginal symptoms dyspnea Also some fatigue   F/U myovue 05/09/15  Low risk but abnormal   Nuclear stress EF: 58%.  Defect 1: There is a small defect of mild severity present in the apical inferior and apical lateral location.  This is a low risk study.  ROS: Denies fever, malais, weight loss, blurry vision, decreased visual acuity, cough, sputum, SOB, hemoptysis, pleuritic pain, palpitaitons, heartburn, abdominal pain, melena, lower extremity edema, claudication, or rash.  All other systems reviewed and negative  General: Affect appropriate Healthy:  appears stated age 81: normal Neck supple with no adenopathy JVP normal no bruits no thyromegaly Lungs clear with no wheezing and good diaphragmatic motion Heart:  S1/S2 no murmur, no rub, gallop or click PMI normal Abdomen: benighn, BS positve, no tenderness, no AAA no bruit.  No HSM or HJR Distal pulses intact with no bruits No edema Neuro non-focal Skin warm and dry No muscular weakness   Current Outpatient Prescriptions  Medication Sig Dispense Refill  . allopurinol (ZYLOPRIM) 300 MG tablet Take 1 tablet (300 mg total) by mouth daily. 90 tablet 3  .  aspirin 81 MG tablet Take 81 mg by mouth daily.    Marland Kitchen atorvastatin (LIPITOR) 40 MG tablet Take 1 tablet (40 mg total) by mouth daily. 90 tablet 3  . carvedilol (COREG CR) 20 MG 24 hr capsule Take 1 capsule (20 mg total) by mouth daily. 90 capsule 3  . esomeprazole (NEXIUM) 20 MG capsule Take 20 mg by mouth daily as needed (indigestion).    . feeding supplement, ENSURE COMPLETE, (ENSURE COMPLETE) LIQD Take 237 mLs by mouth 2 (two) times daily between meals.    Marland Kitchen lisinopril (PRINIVIL,ZESTRIL) 20 MG tablet Take 1 tablet (20 mg total) by mouth daily. 90 tablet 3  . neomycin-polymyxin b-dexamethasone (MAXITROL) 3.5-10000-0.1 SUSP Place 1 drop into both eyes 3 (three) times daily.  0  . primidone (MYSOLINE) 50 MG tablet Take 0.5 tablets (25 mg total) by mouth daily. 45 tablet 3  . tamsulosin (FLOMAX) 0.4 MG CAPS capsule Take 1 capsule (0.4 mg total) by mouth daily. 90 capsule 3   No current facility-administered medications for this visit.    Allergies  Sulfonamide derivatives  Electrocardiogram:  SR rate 64 LAD RBBB 2014   05/01/15  SR rate 60  PR 214  RBBB  Assessment and Plan CAD/CABG:  Low risk myovue no ischemia EF preserved continue medical Rx  He declines nitro HTN: Well controlled.  Continue current medications and low sodium Dash type diet.   Tolerate Slightly higher systolic as he is postural  BP 914-782 systolic on standing today   Chol:  Cholesterol is at goal.  Continue current dose of statin and diet Rx.  No myalgias or side effects.  F/U  LFT's in 6 months. Lab Results  Component Value Date   LDLCALC 75 10/14/2013             Prostate CA   Lab Results  Component Value Date   PSA 2.66 10/25/2014   PSA 2.74 10/14/2013   PSA 2.58 10/19/2012    Tremor  Continue mysoline  Not apparent on exam today   Ordered :  F/U with me in a year

## 2015-05-10 ENCOUNTER — Ambulatory Visit (INDEPENDENT_AMBULATORY_CARE_PROVIDER_SITE_OTHER): Payer: Medicare Other | Admitting: Cardiovascular Disease

## 2015-05-10 ENCOUNTER — Encounter: Payer: Self-pay | Admitting: Cardiovascular Disease

## 2015-05-10 VITALS — BP 160/82 | HR 67 | Ht 71.0 in | Wt 193.4 lb

## 2015-05-10 DIAGNOSIS — I2510 Atherosclerotic heart disease of native coronary artery without angina pectoris: Secondary | ICD-10-CM

## 2015-05-10 DIAGNOSIS — I452 Bifascicular block: Secondary | ICD-10-CM | POA: Diagnosis not present

## 2015-05-10 NOTE — Patient Instructions (Addendum)
Medication Instructions:  NO CHANGES  Labwork: NONE  Testing/Procedures: NONE  Follow-Up: You will receive a reminder letter in the mail two months in advance. If you don't receive a letter, please call our office to schedule the follow-up appointment.Your physician wants you to follow-up in:6 MONTHS  WITH  DR Johnsie Cancel  Any Other Special Instructions Will Be Listed Below (If Applicable).

## 2015-05-29 DIAGNOSIS — H31012 Macula scars of posterior pole (postinflammatory) (post-traumatic), left eye: Secondary | ICD-10-CM | POA: Diagnosis not present

## 2015-05-29 DIAGNOSIS — H35371 Puckering of macula, right eye: Secondary | ICD-10-CM | POA: Diagnosis not present

## 2015-09-07 ENCOUNTER — Encounter: Payer: Self-pay | Admitting: *Deleted

## 2015-09-12 ENCOUNTER — Other Ambulatory Visit: Payer: Self-pay | Admitting: Family Medicine

## 2015-10-06 ENCOUNTER — Other Ambulatory Visit: Payer: Self-pay | Admitting: Family Medicine

## 2015-10-06 NOTE — Telephone Encounter (Signed)
Left detailed message on voicemail to schedule physical exam.

## 2015-10-24 ENCOUNTER — Other Ambulatory Visit: Payer: Self-pay | Admitting: Family Medicine

## 2015-11-06 ENCOUNTER — Encounter: Payer: Self-pay | Admitting: *Deleted

## 2015-11-07 NOTE — Progress Notes (Signed)
Patient ID: [name] [name], male   DOB: 1928/11/08, 82 y.o.   MRN: [id number]   82 y.o.  Last seen in 2014 after moving from [place]  Had CABG in 2006 by Dr Darcey Nora.  No recent problems with SSCP, dyspnea or palpitations. Compliant with meds. Recent diagnosis of DM. Has increasing lower back pain that limits mobility. CRF;s otherwise HTN and elevated lipids. Has BP cuff at home but not sure it is accurate. Had been running around [id number] systolic Compliant with meds. Limited exercise.   Discussed increasing lisinopril to 40mg   BP's running [id number] systolic at home.  Needs glaucoma surgery and also teeth worked on . Does not remember his dentist or eye doctors name  He is clear to have both systems worked on   Some dizzyness with exertion postural.  Previous anginal symptoms dyspnea Also some fatigue   F/U myovue 05/09/15  Low risk but abnormal   Nuclear stress EF: 58%.  Defect 1: There is a small defect of mild severity present in the apical inferior and apical lateral location.  This is a low risk study.  ROS: Denies fever, malais, weight loss, blurry vision, decreased visual acuity, cough, sputum, SOB, hemoptysis, pleuritic pain, palpitaitons, heartburn, abdominal pain, melena, lower extremity edema, claudication, or rash.  All other systems reviewed and negative  General: Affect appropriate Healthy:  appears stated age 47: normal Neck supple with no adenopathy JVP normal no bruits no thyromegaly Lungs clear with no wheezing and good diaphragmatic motion Heart:  S1/S2 no murmur, no rub, gallop or click PMI normal Abdomen: benighn, BS positve, no tenderness, no AAA no bruit.  No HSM or HJR Distal pulses intact with no bruits No edema Neuro non-focal tremor in both hands  Skin warm and dry No muscular weakness   Current Outpatient Prescriptions  Medication Sig Dispense Refill  . allopurinol (ZYLOPRIM) 300 MG tablet Take 300 mg by mouth daily.    Marland Kitchen aspirin 81 MG tablet  Take 81 mg by mouth daily.    Marland Kitchen atorvastatin (LIPITOR) 40 MG tablet Take 1 tablet (40 mg total) by mouth daily. 90 tablet 3  . carvedilol (COREG CR) 20 MG 24 hr capsule Take 1 capsule (20 mg total) by mouth daily. 90 capsule 3  . esomeprazole (NEXIUM) 20 MG capsule Take 20 mg by mouth daily as needed (indigestion).    . feeding supplement, ENSURE COMPLETE, (ENSURE COMPLETE) LIQD Take 237 mLs by mouth 2 (two) times daily between meals.    Marland Kitchen lisinopril (PRINIVIL,ZESTRIL) 20 MG tablet Take 20 mg by mouth daily.    . primidone (MYSOLINE) 50 MG tablet Take 25 mg by mouth daily.    . tamsulosin (FLOMAX) 0.4 MG CAPS capsule Take 0.4 mg by mouth daily.     No current facility-administered medications for this visit.    Allergies  Sulfonamide derivatives  Electrocardiogram:  SR rate 64 LAD RBBB 2014   05/01/15  SR rate 60  PR 214  RBBB  Assessment and Plan CAD/CABG 2006 :  Low risk myovue 05/09/15  no ischemia EF preserved continue medical Rx  He declines nitro HTN: Well controlled.  Continue current medications and low sodium Dash type diet.   Tolerate Slightly higher systolic as he is postural  BP 0000000 systolic on standing today   Chol: labs with Dr Damita Dunnings 11/24/15   Cholesterol is at goal.  Continue current dose of statin and diet Rx.  No myalgias or side effects.  F/U  LFT's in 6  months. Lab Results  Component Value Date   LDLCALC 75 10/14/2013             Prostate CA   Lab Results  Component Value Date   PSA 2.66 10/25/2014   PSA 2.74 10/14/2013   PSA 2.58 10/19/2012    Tremor  Continue mysoline  Apparent in hands today     [name]

## 2015-11-08 ENCOUNTER — Ambulatory Visit (INDEPENDENT_AMBULATORY_CARE_PROVIDER_SITE_OTHER): Payer: Medicare Other | Admitting: Cardiovascular Disease

## 2015-11-08 ENCOUNTER — Encounter: Payer: Self-pay | Admitting: Cardiovascular Disease

## 2015-11-08 VITALS — BP 140/70 | HR 72 | Ht 71.0 in | Wt 197.1 lb

## 2015-11-08 DIAGNOSIS — I452 Bifascicular block: Secondary | ICD-10-CM

## 2015-11-08 MED ORDER — CARVEDILOL PHOSPHATE ER 20 MG PO CP24: 20.0000 mg | ORAL_CAPSULE | Freq: Every day | ORAL | Status: DC

## 2015-11-08 MED ORDER — ATORVASTATIN CALCIUM 40 MG PO TABS: 40.0000 mg | ORAL_TABLET | Freq: Every day | ORAL | Status: DC

## 2015-11-08 MED ORDER — LISINOPRIL 20 MG PO TABS: 20.0000 mg | ORAL_TABLET | Freq: Every day | ORAL | Status: DC

## 2015-11-08 NOTE — Patient Instructions (Signed)

## 2015-11-21 ENCOUNTER — Other Ambulatory Visit: Payer: Self-pay | Admitting: Family Medicine

## 2015-11-23 ENCOUNTER — Other Ambulatory Visit: Payer: Self-pay | Admitting: Family Medicine

## 2015-11-23 DIAGNOSIS — Z125 Encounter for screening for malignant neoplasm of prostate: Secondary | ICD-10-CM

## 2015-11-23 DIAGNOSIS — E119 Type 2 diabetes mellitus without complications: Secondary | ICD-10-CM

## 2015-11-28 ENCOUNTER — Other Ambulatory Visit (INDEPENDENT_AMBULATORY_CARE_PROVIDER_SITE_OTHER): Payer: Medicare Other

## 2015-11-28 ENCOUNTER — Ambulatory Visit (INDEPENDENT_AMBULATORY_CARE_PROVIDER_SITE_OTHER): Payer: Medicare Other

## 2015-11-28 VITALS — BP 140/76 | HR 60 | Temp 97.9°F | Ht 71.0 in | Wt 195.8 lb

## 2015-11-28 DIAGNOSIS — Z0000 Encounter for general adult medical examination without abnormal findings: Secondary | ICD-10-CM | POA: Diagnosis not present

## 2015-11-28 DIAGNOSIS — E119 Type 2 diabetes mellitus without complications: Secondary | ICD-10-CM | POA: Diagnosis not present

## 2015-11-28 DIAGNOSIS — Z125 Encounter for screening for malignant neoplasm of prostate: Secondary | ICD-10-CM

## 2015-11-28 LAB — COMPREHENSIVE METABOLIC PANEL
ALBUMIN: 3.7 g/dL (ref 3.5–5.2)
ALK PHOS: 69 U/L (ref 39–117)
ALT: 15 U/L (ref 0–53)
AST: 16 U/L (ref 0–37)
BUN: 23 mg/dL (ref 6–23)
CO2: 28 mEq/L (ref 19–32)
CREATININE: 1.44 mg/dL (ref 0.40–1.50)
Calcium: 9.8 mg/dL (ref 8.4–10.5)
Chloride: 103 mEq/L (ref 96–112)
GFR: 49.57 mL/min — AB (ref >60.00)
Glucose, Bld: 134 mg/dL — ABNORMAL HIGH (ref 70–99)
POTASSIUM: 4.6 meq/L (ref 3.5–5.1)
SODIUM: 138 meq/L (ref 135–145)
TOTAL PROTEIN: 6.8 g/dL (ref 6.0–8.3)
Total Bilirubin: 1 mg/dL (ref 0.2–1.2)

## 2015-11-28 LAB — LDL CHOLESTEROL, DIRECT: Direct LDL: 68 mg/dL

## 2015-11-28 LAB — PSA, MEDICARE: PSA: 2.82 ng/mL (ref 0.10–4.00)

## 2015-11-28 LAB — LIPID PANEL
CHOLESTEROL: 153 mg/dL (ref 0–200)
HDL: 39.7 mg/dL (ref >39.00)
NonHDL: 113.51
Total CHOL/HDL Ratio: 4
Triglycerides: 231 mg/dL — ABNORMAL HIGH (ref 0.0–149.0)
VLDL: 46.2 mg/dL — ABNORMAL HIGH (ref 0.0–40.0)

## 2015-11-28 LAB — HEMOGLOBIN A1C: HEMOGLOBIN A1C: 6.9 % — AB (ref 4.6–6.5)

## 2015-11-28 LAB — TSH: TSH: 7.03 u[IU]/mL — ABNORMAL HIGH (ref 0.35–4.50)

## 2015-11-28 NOTE — Progress Notes (Signed)
Pre visit review using our clinic review tool, if applicable. No additional management support is needed unless otherwise documented below in the visit note. 

## 2015-11-28 NOTE — Progress Notes (Signed)
Subjective:   [name] [name] is a 82 y.o. male who presents for Medicare Annual/Subsequent preventive examination.  Cardiac Risk Factors include: advanced age (>49men, >61 women);diabetes mellitus;dyslipidemia;hypertension;male gender     Objective:    Vitals: BP 140/76 mmHg  Pulse 60  Temp(Src) 97.9 F (36.6 C) (Oral)  Ht 5\' 11"  (1.803 m)  Wt 195 lb 12 oz (88.792 kg)  BMI 27.31 kg/m2  SpO2 98%  Body mass index is 27.31 kg/(m^2).  Tobacco History  Smoking status  . Never Smoker   Smokeless tobacco  . Never Used     Counseling given: No   Past Medical History  Diagnosis Date  . Hyperlipidemia 12/2005    takes Atorvastatin daily  . Benign essential tremor 2005    takes Primidone daily  . Gout 1995    "~ once/yr" (03/17/2014)  . Coronary artery disease 2007    s/p CABG, DR Johnsie Cancel  . Hypertension 1995    takes Lisinopril and Coreg daily  . Back pain     occasionally  . GERD (gastroesophageal reflux disease)     takes Nexium daily  . Constipation     takes OTC stool softener  . History of colon polyps   . Type II diabetes mellitus (HCC)     no meds;diet and exercise controlled   . Acute renal failure ([place]) 12/06/2013  . possible HCAP (healthcare-associated pneumonia) 12/09/2013  . Right bundle branch block (RBBB)   . Choledocholithiasis 12/06/2013  . Prostate cancer ([place]) 2007    S/P treatment, followed by Urology prev   Past Surgical History  Procedure Laterality Date  . Cystoscopy  02/15/2004    Mod BPH, moder tribec ?  . Prostate cryoablation  09/18/2005    prostate CA  . Adenosine myoview  01/24/2006    Ischemia by EKG, Cath  . Lumbar fusion  02/2012    lumbar spine for spinal stenosis  . Cataract extraction w/ intraocular lens implant Left 10/2013  . Ercp N/A 12/06/2013    Procedure: ENDOSCOPIC RETROGRADE CHOLANGIOPANCREATOGRAPHY (ERCP);  Surgeon: Milus Banister, MD;  Location: [facility];  Service: Endoscopy;  Laterality: N/A;  . Coronary artery bypass  graft  2007    CABGX 4  . Colonoscopy    . Cholecystectomy N/A 12/06/2013    Procedure: Diagnostic Laparoscopy for  drainage Intrabdominal abcess;  Surgeon: Rolm Bookbinder, MD;  Location: [facility];  Service: General;  Laterality: N/A;  . Laparoscopic cholecystectomy  03/17/2014  . Cholecystectomy N/A 03/17/2014    Procedure: LAPAROSCOPIC CHOLECYSTECTOMY ;  Surgeon: Rolm Bookbinder, MD;  Location: [facility] OR;  Service: General;  Laterality: N/A;   Family History  Problem Relation Age of Onset  . Heart disease Mother   . Cancer Mother   . Cancer Sister   . Diabetes Sister   . Cancer Brother     prostate  . Prostate cancer Brother   . Hypothyroidism Brother   . Depression Neg Hx   . Alcohol abuse Neg Hx   . Drug abuse Neg Hx   . Stroke Neg Hx   . Colon cancer Neg Hx    History  Sexual Activity  . Sexual Activity: No    Outpatient Encounter Prescriptions as of 11/28/2015  Medication Sig  . allopurinol (ZYLOPRIM) 300 MG tablet Take 300 mg by mouth daily.  Marland Kitchen aspirin 81 MG tablet Take 81 mg by mouth daily.  Marland Kitchen atorvastatin (LIPITOR) 40 MG tablet Take 1 tablet (40 mg total) by mouth daily.  Marland Kitchen  carvedilol (COREG CR) 20 MG 24 hr capsule Take 1 capsule (20 mg total) by mouth daily.  . feeding supplement, ENSURE COMPLETE, (ENSURE COMPLETE) LIQD Take 237 mLs by mouth 2 (two) times daily between meals.  Marland Kitchen lisinopril (PRINIVIL,ZESTRIL) 20 MG tablet Take 1 tablet (20 mg total) by mouth daily.  Marland Kitchen NEXIUM 20 MG capsule TAKE 1 CAPSULE DAILY  . primidone (MYSOLINE) 50 MG tablet Take 25 mg by mouth daily.  . tamsulosin (FLOMAX) 0.4 MG CAPS capsule Take 0.4 mg by mouth daily.   No facility-administered encounter medications on file as of 11/28/2015.    Activities of Daily Living In your present state of health, do you have any difficulty performing the following activities: 11/28/2015  Hearing? Y  Vision? N  Difficulty concentrating or making decisions? N  Walking or climbing stairs? N  Dressing or  bathing? N  Doing errands, shopping? N  Preparing Food and eating ? N  Using the Toilet? N  In the past six months, have you accidently leaked urine? N  Do you have problems with loss of bowel control? N  Managing your Medications? N  Managing your Finances? N  Housekeeping or managing your Housekeeping? N    Patient Care Team: Tonia Ghent, MD as PCP - General Carolan Clines, MD as Consulting Physician (Urology)   Assessment:     Hearing Screening   125Hz  250Hz  500Hz  1000Hz  2000Hz  4000Hz  8000Hz   Right ear:   40 40 40 0   Left ear:   40 0 0 0   Vision Screening Comments: Last eye exam 05/29/2015 @ [facility]   Exercise Activities and Dietary recommendations Current Exercise Habits: Home exercise routine, Type of exercise: walking, Time (Minutes): 30, Frequency (Times/Week): 7, Weekly Exercise (Minutes/Week): 210, Intensity: Mild  Goals    . Increase physical activity     Starting 11/28/15, I will continue to walk for 30 min daily.       Fall Risk Fall Risk  11/28/2015 10/21/2013  Falls in the past year? No No   Depression Screen PHQ 2/9 Scores 11/28/2015 10/21/2013  PHQ - 2 Score 0 0    Cognitive Testing MMSE - Mini Mental State Exam 11/28/2015  Orientation to time 5  Orientation to Place 5  Registration 3  Attention/ Calculation 5  Recall 3  Language- name 2 objects 0  Language- repeat 1  Language- follow 3 step command 3  Language- read & follow direction 1  Write a sentence 0  Copy design 0  Total score 26    Immunization History  Administered Date(s) Administered  . Influenza Split 06/23/2012  . Influenza Whole 07/10/2005, 06/26/2007  . Influenza,inj,Quad PF,36+ Mos 06/22/2013  . Influenza-Unspecified 06/09/2014, 07/29/2015  . Pneumococcal Conjugate-13 10/28/2014  . Pneumococcal Polysaccharide-23 07/10/2005  . Td 04/09/2000, 10/18/2011   Screening Tests Health Maintenance  Topic Date Due  . FOOT EXAM  12/05/2015 (Originally  10/29/2015)  . ZOSTAVAX  11/08/2020 (Originally 01/25/1991)  . INFLUENZA VACCINE  04/09/2016  . OPHTHALMOLOGY EXAM  05/28/2016  . HEMOGLOBIN A1C  05/30/2016  . TETANUS/TDAP  10/17/2021  . PNA vac Low Risk Adult  Completed      Plan:     I have personally reviewed the Medicare Annual Wellness questionnaire and have noted the following in the patient's chart:  A. Medical and social history B. Use of alcohol, tobacco or illicit drugs  C. Current medications and supplements D. Functional ability and status E.  Nutritional status F.  Physical  activity G. Advance directives H. List of other physicians I.  Hospitalizations, surgeries, and ER visits in previous 12 months J.  [place] to include hearing, vision, cognitive, depression L. Referrals and appointments - none  In addition, I reviewed and addressed preventive protocols, quality metrics, and best practice recommendations specific to patient. A written personalized care plan for preventive services as well as general preventive health recommendations were provided to patient.  See attached scanned questionnaire for additional information.   Signed,   Lindell Noe, MHA, BS, LPN Health Advisor [id number]   I reviewed health advisor's note, was available for consultation on the day of service listed in this note, and agree with documentation and plan. [name] Stain, MD.

## 2015-11-28 NOTE — Patient Instructions (Addendum)
[name] [name] , Thank you for taking time to come for your Medicare Wellness Visit. I appreciate your ongoing commitment to your health goals. Please review the following plan we discussed and let me know if I can assist you in the future.   These are the goals we discussed: Goals    . Increase physical activity     Starting 11/28/15, I will continue to walk for 30 min daily.        This is a list of the screening recommended for you and due dates:  Health Maintenance  Topic Date Due  . Eye exam for diabetics  05/10/2016  . Complete foot exam   12/05/2015*  . Shingles Vaccine  11/08/2020*  . Flu Shot  04/09/2016  . Hemoglobin A1C  05/30/2016  . Tetanus Vaccine  10/17/2021  . Pneumonia vaccines  Completed  *Topic was postponed. The date shown is not the original due date.   Preventive Care for Adults  A healthy lifestyle and preventive care can promote health and wellness. Preventive health guidelines for adults include the following key practices.  . A routine yearly physical is a good way to check with your health care provider about your health and preventive screening. It is a chance to share any concerns and updates on your health and to receive a thorough exam.  . Visit your dentist for a routine exam and preventive care every 6 months. Brush your teeth twice a day and floss once a day. Good oral hygiene prevents tooth decay and gum disease.  . The frequency of eye exams is based on your age, health, family medical history, use  of contact lenses, and other factors. Follow your health care provider's ecommendations for frequency of eye exams.  . Eat a healthy diet. Foods like vegetables, fruits, whole grains, low-fat dairy products, and lean protein foods contain the nutrients you need without too many calories. Decrease your intake of foods high in solid fats, added sugars, and salt. Eat the right amount of calories for you. Get information about a proper diet from your health  care provider, if necessary.  . Regular physical exercise is one of the most important things you can do for your health. Most adults should get at least 150 minutes of moderate-intensity exercise (any activity that increases your heart rate and causes you to sweat) each week. In addition, most adults need muscle-strengthening exercises on 2 or more days a week.  Silver Sneakers may be a benefit available to you. To determine eligibility, you may visit the website: www.silversneakers.com or contact program at (325)219-1758 Mon-Fri between 8AM-8PM.   . Maintain a healthy weight. The body mass index (BMI) is a screening tool to identify possible weight problems. It provides an estimate of body fat based on height and weight. Your health care provider can find your BMI and can help you achieve or maintain a healthy weight.   For adults 20 years and older: ? A BMI below 18.5 is considered underweight. ? A BMI of 18.5 to 24.9 is normal. ? A BMI of 25 to 29.9 is considered overweight. ? A BMI of 30 and above is considered obese.   . Maintain normal blood lipids and cholesterol levels by exercising and minimizing your intake of saturated fat. Eat a balanced diet with plenty of fruit and vegetables. Blood tests for lipids and cholesterol should begin at age 6 and be repeated every 5 years. If your lipid or cholesterol levels are high, you are over  50, or you are at high risk for heart disease, you may need your cholesterol levels checked more frequently. Ongoing high lipid and cholesterol levels should be treated with medicines if diet and exercise are not working.  . If you smoke, find out from your health care provider how to quit. If you do not use tobacco, please do not start.  . If you choose to drink alcohol, please do not consume more than 2 drinks per day. One drink is considered to be 12 ounces (355 mL) of beer, 5 ounces (148 mL) of wine, or 1.5 ounces (44 mL) of liquor.  . If you are 63-77  years old, ask your health care provider if you should take aspirin to prevent strokes.  . Use sunscreen. Apply sunscreen liberally and repeatedly throughout the day. You should seek shade when your shadow is shorter than you. Protect yourself by wearing long sleeves, pants, a wide-brimmed hat, and sunglasses year round, whenever you are outdoors.  . Once a month, do a whole body skin exam, using a mirror to look at the skin on your back. Tell your health care provider of new moles, moles that have irregular borders, moles that are larger than a pencil eraser, or moles that have changed in shape or color.

## 2015-12-05 ENCOUNTER — Ambulatory Visit (INDEPENDENT_AMBULATORY_CARE_PROVIDER_SITE_OTHER): Payer: Medicare Other | Admitting: Family Medicine

## 2015-12-05 ENCOUNTER — Encounter: Payer: Self-pay | Admitting: Family Medicine

## 2015-12-05 VITALS — BP 142/70 | HR 66 | Temp 98.4°F | Wt 199.5 lb

## 2015-12-05 DIAGNOSIS — E119 Type 2 diabetes mellitus without complications: Secondary | ICD-10-CM

## 2015-12-05 DIAGNOSIS — Z8546 Personal history of malignant neoplasm of prostate: Secondary | ICD-10-CM

## 2015-12-05 DIAGNOSIS — N183 Chronic kidney disease, stage 3 unspecified: Secondary | ICD-10-CM

## 2015-12-05 DIAGNOSIS — E785 Hyperlipidemia, unspecified: Secondary | ICD-10-CM

## 2015-12-05 DIAGNOSIS — I10 Essential (primary) hypertension: Secondary | ICD-10-CM | POA: Diagnosis not present

## 2015-12-05 LAB — HM DIABETES FOOT EXAM

## 2015-12-05 MED ORDER — ALLOPURINOL 300 MG PO TABS: 300.0000 mg | ORAL_TABLET | Freq: Every day | ORAL | Status: DC

## 2015-12-05 MED ORDER — PRIMIDONE 50 MG PO TABS: 25.0000 mg | ORAL_TABLET | Freq: Every day | ORAL | Status: DC

## 2015-12-05 MED ORDER — ESOMEPRAZOLE MAGNESIUM 20 MG PO CPDR: 20.0000 mg | DELAYED_RELEASE_CAPSULE | Freq: Every day | ORAL | Status: DC

## 2015-12-05 MED ORDER — TAMSULOSIN HCL 0.4 MG PO CAPS: 0.4000 mg | ORAL_CAPSULE | Freq: Every day | ORAL | Status: DC

## 2015-12-05 NOTE — Progress Notes (Signed)
Pre visit review using our clinic review tool, if applicable. No additional management support is needed unless otherwise documented below in the visit note.  Tremor.  On baseline med.  Not very bothersome.  No sig change.  He wants to continue as is.  No ADE on med.  Swallowing well.    Hypertension:    Using medication without problems or lightheadedness: yes Chest pain with exertion:no Edema:no Short of breath:no  Elevated Cholesterol: Using medications without problems:yes Muscle aches: no Diet compliance: "evidently not too good since I gained some weight."  Exercise: walking, limited by back issues.  D/w pt.   H/o prostate tx, PSA stable.  Stream okay, no LUTS.  No nocturia.    Diabetes:  Using medications without difficulties: no meds.  Labs d/w pt Feet problems:no Blood Sugars averaging: not checked eye exam within last year: fall 2016, no retinopathy per patient report  PMH and SH reviewed  ROS: See HPI, otherwise noncontributory.  Meds, vitals, and allergies reviewed.   GEN: nad, alert and oriented HEENT: mucous membranes moist NECK: supple w/o LA CV: rrr. PULM: ctab, no inc wob ABD: soft, +bs SKIN: no acute rash Tremor in the hands noted B  Diabetic foot exam: Normal inspection No skin breakdown No calluses  Normal DP pulses Normal sensation to light touch and monofilament Nails normal

## 2015-12-05 NOTE — Patient Instructions (Signed)
Recheck labs in about 3 months.  Cut out the ice cream.  Stop taking ibuprofen or aleve.  Use tylenol PM at night if needed.   You can try tapering off the flomax, gradually.   Take care.  Glad to see you.

## 2015-12-07 NOTE — Assessment & Plan Note (Signed)
Reasonable control, continue as is, d/w pt.

## 2015-12-07 NOTE — Assessment & Plan Note (Signed)
No change in meds, he'll work on diet. D/w pt. See AVS.

## 2015-12-07 NOTE — Assessment & Plan Note (Signed)
Recheck labs in about 3 months.  Cut out the ice cream.  No change in meds o/w re: sugar.  He agrees.  A1c d/w pt.

## 2015-12-07 NOTE — Assessment & Plan Note (Addendum)
psa stable.  He can try slow taper flomax.  D/w pt.

## 2015-12-07 NOTE — Assessment & Plan Note (Signed)
D/w pt.  He'll stop nsaid use.  He was taking some ibuprofen.  Recheck cr with next set of labs.

## 2016-02-21 ENCOUNTER — Telehealth: Payer: Self-pay

## 2016-02-21 NOTE — Telephone Encounter (Signed)
[name] [name] [phone]  Lakshya dropped off a letter from [employer] asking him to check with his doctor about changing his medication to something less expensive. Placed in Rx box up front.

## 2016-02-21 NOTE — Telephone Encounter (Signed)
Letter is from [place] and is on your desk.

## 2016-02-21 NOTE — Telephone Encounter (Signed)
Call pt.  If he is willing to change nexium to omeprazole capsules, or pantoprazole or rabeprazole tabs, then it will be cheaper for him.   I'm okay with him making the change, assuming he hasn't tried and failed one of those meds prev.   Let me know if I need to change the med/send in new rx.  Thanks.  See forms.

## 2016-02-22 MED ORDER — OMEPRAZOLE 40 MG PO CPDR: 40.0000 mg | DELAYED_RELEASE_CAPSULE | Freq: Every day | ORAL | Status: DC

## 2016-02-22 NOTE — Telephone Encounter (Signed)
Patient has not tried any of these in the past and is fine with whatever you choose except he would like for it to be a capsule rather than a tablet.

## 2016-02-22 NOTE — Telephone Encounter (Signed)
Capsules sent.  Should be equivalent.  Update me as needed.  Thanks.

## 2016-02-22 NOTE — Telephone Encounter (Signed)
Left message on patient's voicemail to return call

## 2016-06-03 ENCOUNTER — Ambulatory Visit (INDEPENDENT_AMBULATORY_CARE_PROVIDER_SITE_OTHER): Payer: Medicare Other

## 2016-06-03 DIAGNOSIS — Z23 Encounter for immunization: Secondary | ICD-10-CM

## 2016-10-31 ENCOUNTER — Ambulatory Visit (INDEPENDENT_AMBULATORY_CARE_PROVIDER_SITE_OTHER): Payer: Medicare Other | Admitting: Internal Medicine

## 2016-10-31 ENCOUNTER — Encounter: Payer: Self-pay | Admitting: Internal Medicine

## 2016-10-31 ENCOUNTER — Ambulatory Visit: Payer: Medicare Other | Admitting: Family Medicine

## 2016-10-31 VITALS — BP 142/80 | HR 77 | Temp 97.4°F | Wt 191.5 lb

## 2016-10-31 DIAGNOSIS — R1030 Lower abdominal pain, unspecified: Secondary | ICD-10-CM | POA: Diagnosis not present

## 2016-10-31 NOTE — Progress Notes (Signed)
Subjective:    Patient ID: [name] [name], male    DOB: Jul 07, 1929, 83 y.o.   MRN: [id number]  HPI  Pt presents to the clinic today with c/o lower abdominal pain. This stated  3 days ago. He is unable to describe the pain, but reports it just hurt. The pain did not radiate. He denies nausea, vomiting, diarrhea or constipation. He denies urgency, frequency or dysuria. He has not taken anything OTC for this. He reports his pain resolved yesterday and he has not had symptoms since, but his family wanted him to come in to be checked out.  Review of Systems      Past Medical History:  Diagnosis Date  . Acute renal failure ([place]) 12/06/2013  . Back pain    occasionally  . Benign essential tremor 2005   takes Primidone daily  . Choledocholithiasis 12/06/2013  . Constipation    takes OTC stool softener  . Coronary artery disease 2007   s/p CABG, DR Johnsie Cancel  . GERD (gastroesophageal reflux disease)    takes Nexium daily  . Gout 1995   "~ once/yr" (03/17/2014)  . History of colon polyps   . Hyperlipidemia 12/2005   takes Atorvastatin daily  . Hypertension 1995   takes Lisinopril and Coreg daily  . possible HCAP (healthcare-associated pneumonia) 12/09/2013  . Prostate cancer ([place]) 2007   S/P treatment, followed by Urology prev  . Right bundle branch block (RBBB)   . Type II diabetes mellitus (HCC)    no meds;diet and exercise controlled     Current Outpatient Prescriptions  Medication Sig Dispense Refill  . allopurinol (ZYLOPRIM) 300 MG tablet Take 1 tablet (300 mg total) by mouth daily. 90 tablet 3  . aspirin 81 MG tablet Take 81 mg by mouth daily.    Marland Kitchen atorvastatin (LIPITOR) 40 MG tablet Take 1 tablet (40 mg total) by mouth daily. 90 tablet 3  . carvedilol (COREG CR) 20 MG 24 hr capsule Take 1 capsule (20 mg total) by mouth daily. 90 capsule 3  . lisinopril (PRINIVIL,ZESTRIL) 20 MG tablet Take 1 tablet (20 mg total) by mouth daily. 90 tablet 3  . Nutritional Supplements  (CARNATION BREAKFAST ESSENTIALS PO) Take by mouth. 1 a day    . omeprazole (PRILOSEC) 40 MG capsule Take 1 capsule (40 mg total) by mouth daily. 90 capsule 3  . primidone (MYSOLINE) 50 MG tablet Take 0.5 tablets (25 mg total) by mouth daily. 45 tablet 3  . tamsulosin (FLOMAX) 0.4 MG CAPS capsule Take 1 capsule (0.4 mg total) by mouth daily. 90 capsule 3   No current facility-administered medications for this visit.     Allergies  Allergen Reactions  . Sulfonamide Derivatives     REACTION: lethargic    Family History  Problem Relation Age of Onset  . Heart disease Mother   . Cancer Mother   . Cancer Sister   . Diabetes Sister   . Cancer Brother     prostate  . Prostate cancer Brother   . Hypothyroidism Brother   . Depression Neg Hx   . Alcohol abuse Neg Hx   . Drug abuse Neg Hx   . Stroke Neg Hx   . Colon cancer Neg Hx     Social History   Social History  . Marital status: Married    Spouse name: N/A  . Number of children: 1  . Years of education: N/A   Occupational History  . Retired Pharmacist, community  Social History Main Topics  . Smoking status: Never Smoker  . Smokeless tobacco: Never Used  . Alcohol use No  . Drug use: No  . Sexual activity: No   Other Topics Concern  . 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 [employer]   Agent orange exposure     Constitutional: Denies fever, malaise, fatigue, headache or abrupt weight changes.  Gastrointestinal: Denies abdominal pain, bloating, constipation, diarrhea or blood in the stool.  GU: Denies urgency, frequency, pain with urination, burning sensation, blood in urine, odor or discharge.  No other specific complaints in a complete review of systems (except as listed in HPI above).  Objective:   Physical Exam    BP (!) 142/80   Pulse 77   Temp 97.4 F (36.3 C) (Oral)   Wt 191 lb 8 oz (86.9 kg)   SpO2 98%   BMI 26.71 kg/m  Wt  Readings from Last 3 Encounters:  10/31/16 191 lb 8 oz (86.9 kg)  12/05/15 199 lb 8 oz (90.5 kg)  11/28/15 195 lb 12 oz (88.8 kg)    General: Appears his stated age, in NAD. Abdomen: Soft and nontender. Normal bowel sounds. No distention or masses noted.   BMET    Component Value Date/Time   NA 138 11/28/2015 0941   NA 136 (A) 12/17/2013   K 4.6 11/28/2015 0941   CL 103 11/28/2015 0941   CO2 28 11/28/2015 0941   GLUCOSE 134 (H) 11/28/2015 0941   BUN 23 11/28/2015 0941   BUN 14 12/17/2013   CREATININE 1.44 11/28/2015 0941   CREATININE 1.06 03/21/2014 1653   CALCIUM 9.8 11/28/2015 0941   GFRNONAA 55 (L) 03/18/2014 0552   GFRAA 63 (L) 03/18/2014 0552    Lipid Panel     Component Value Date/Time   CHOL 153 11/28/2015 0941   TRIG 231.0 (H) 11/28/2015 0941   HDL 39.70 11/28/2015 0941   CHOLHDL 4 11/28/2015 0941   VLDL 46.2 (H) 11/28/2015 0941   LDLCALC 75 10/14/2013 0744    CBC    Component Value Date/Time   WBC 6.7 03/21/2014 1546   RBC 3.29 (L) 03/21/2014 1546   HGB 10.3 (L) 03/21/2014 1546   HCT 29.8 (L) 03/21/2014 1546   PLT 250 03/21/2014 1546   MCV 90.6 03/21/2014 1546   MCH 31.3 03/21/2014 1546   MCHC 34.6 03/21/2014 1546   RDW 14.7 03/21/2014 1546   LYMPHSABS 3.0 03/10/2014 1216   MONOABS 0.5 03/10/2014 1216   EOSABS 0.2 03/10/2014 1216   BASOSABS 0.0 03/10/2014 1216    Hgb A1C Lab Results  Component Value Date   HGBA1C 6.9 (H) 11/28/2015          Assessment & Plan:   Abdominal pain:  Seems to have resolved No intervention needed at this time We tried to get a urine specimen but he could not urinate Will monitor for now  RTC as needed or if symptoms reoccur Webb Silversmith, NP

## 2016-11-25 ENCOUNTER — Other Ambulatory Visit: Payer: Self-pay | Admitting: Family Medicine

## 2016-11-25 DIAGNOSIS — Z125 Encounter for screening for malignant neoplasm of prostate: Secondary | ICD-10-CM

## 2016-11-25 DIAGNOSIS — M109 Gout, unspecified: Secondary | ICD-10-CM

## 2016-11-25 DIAGNOSIS — E119 Type 2 diabetes mellitus without complications: Secondary | ICD-10-CM

## 2016-11-29 ENCOUNTER — Ambulatory Visit (INDEPENDENT_AMBULATORY_CARE_PROVIDER_SITE_OTHER): Payer: Medicare Other

## 2016-11-29 VITALS — BP 138/82 | HR 82 | Temp 97.5°F | Ht 69.5 in | Wt 187.5 lb

## 2016-11-29 DIAGNOSIS — Z125 Encounter for screening for malignant neoplasm of prostate: Secondary | ICD-10-CM

## 2016-11-29 DIAGNOSIS — M109 Gout, unspecified: Secondary | ICD-10-CM

## 2016-11-29 DIAGNOSIS — E119 Type 2 diabetes mellitus without complications: Secondary | ICD-10-CM

## 2016-11-29 DIAGNOSIS — Z0000 Encounter for general adult medical examination without abnormal findings: Secondary | ICD-10-CM

## 2016-11-29 LAB — COMPREHENSIVE METABOLIC PANEL
ALBUMIN: 3.7 g/dL (ref 3.5–5.2)
ALT: 15 U/L (ref 0–53)
AST: 17 U/L (ref 0–37)
Alkaline Phosphatase: 63 U/L (ref 39–117)
BUN: 24 mg/dL — AB (ref 6–23)
CALCIUM: 9.7 mg/dL (ref 8.4–10.5)
CHLORIDE: 104 meq/L (ref 96–112)
CO2: 27 mEq/L (ref 19–32)
Creatinine, Ser: 1.42 mg/dL (ref 0.40–1.50)
GFR: 50.26 mL/min — AB (ref >60.00)
Glucose, Bld: 164 mg/dL — ABNORMAL HIGH (ref 70–99)
POTASSIUM: 4.6 meq/L (ref 3.5–5.1)
SODIUM: 139 meq/L (ref 135–145)
Total Bilirubin: 0.8 mg/dL (ref 0.2–1.2)
Total Protein: 6.8 g/dL (ref 6.0–8.3)

## 2016-11-29 LAB — LIPID PANEL
CHOLESTEROL: 150 mg/dL (ref 0–200)
HDL: 38.7 mg/dL — AB (ref >39.00)
LDL Cholesterol: 81 mg/dL (ref 0–99)
NonHDL: 111.1
TRIGLYCERIDES: 152 mg/dL — AB (ref 0.0–149.0)
Total CHOL/HDL Ratio: 4
VLDL: 30.4 mg/dL (ref 0.0–40.0)

## 2016-11-29 LAB — TSH: TSH: 3.99 u[IU]/mL (ref 0.35–4.50)

## 2016-11-29 LAB — URIC ACID: URIC ACID, SERUM: 4.6 mg/dL (ref 4.0–7.8)

## 2016-11-29 LAB — PSA, MEDICARE: PSA: 4.05 ng/ml — ABNORMAL HIGH (ref 0.10–4.00)

## 2016-11-29 LAB — HEMOGLOBIN A1C: Hgb A1c MFr Bld: 6.6 % — ABNORMAL HIGH (ref 4.6–6.5)

## 2016-11-29 NOTE — Progress Notes (Signed)
Pre visit review using our clinic review tool, if applicable. No additional management support is needed unless otherwise documented below in the visit note. 

## 2016-11-29 NOTE — Patient Instructions (Signed)
[name] [name] , Thank you for taking time to come for your Medicare Wellness Visit. I appreciate your ongoing commitment to your health goals. Please review the following plan we discussed and let me know if I can assist you in the future.   These are the goals we discussed: Goals    . Increase physical activity          Starting 11/29/2016, I will continue to walk for 30 min daily.        This is a list of the screening recommended for you and due dates:  Health Maintenance  Topic Date Due  . Eye exam for diabetics  05/27/2017*  . Complete foot exam   12/04/2016  . Hemoglobin A1C  06/01/2017  . Tetanus Vaccine  10/17/2021  . Flu Shot  Addressed  . Pneumonia vaccines  Completed  *Topic was postponed. The date shown is not the original due date.   Preventive Care for Adults  A healthy lifestyle and preventive care can promote health and wellness. Preventive health guidelines for adults include the following key practices.  . A routine yearly physical is a good way to check with your health care provider about your health and preventive screening. It is a chance to share any concerns and updates on your health and to receive a thorough exam.  . Visit your dentist for a routine exam and preventive care every 6 months. Brush your teeth twice a day and floss once a day. Good oral hygiene prevents tooth decay and gum disease.  . The frequency of eye exams is based on your age, health, family medical history, use  of contact lenses, and other factors. Follow your health care provider's ecommendations for frequency of eye exams.  . Eat a healthy diet. Foods like vegetables, fruits, whole grains, low-fat dairy products, and lean protein foods contain the nutrients you need without too many calories. Decrease your intake of foods high in solid fats, added sugars, and salt. Eat the right amount of calories for you. Get information about a proper diet from your health care provider, if  necessary.  . Regular physical exercise is one of the most important things you can do for your health. Most adults should get at least 150 minutes of moderate-intensity exercise (any activity that increases your heart rate and causes you to sweat) each week. In addition, most adults need muscle-strengthening exercises on 2 or more days a week.  Silver Sneakers may be a benefit available to you. To determine eligibility, you may visit the website: www.silversneakers.com or contact program at 435-113-5276 Mon-Fri between 8AM-8PM.   . Maintain a healthy weight. The body mass index (BMI) is a screening tool to identify possible weight problems. It provides an estimate of body fat based on height and weight. Your health care provider can find your BMI and can help you achieve or maintain a healthy weight.   For adults 20 years and older: ? A BMI below 18.5 is considered underweight. ? A BMI of 18.5 to 24.9 is normal. ? A BMI of 25 to 29.9 is considered overweight. ? A BMI of 30 and above is considered obese.   . Maintain normal blood lipids and cholesterol levels by exercising and minimizing your intake of saturated fat. Eat a balanced diet with plenty of fruit and vegetables. Blood tests for lipids and cholesterol should begin at age 65 and be repeated every 5 years. If your lipid or cholesterol levels are high, you are over 50,  or you are at high risk for heart disease, you may need your cholesterol levels checked more frequently. Ongoing high lipid and cholesterol levels should be treated with medicines if diet and exercise are not working.  . If you smoke, find out from your health care provider how to quit. If you do not use tobacco, please do not start.  . If you choose to drink alcohol, please do not consume more than 2 drinks per day. One drink is considered to be 12 ounces (355 mL) of beer, 5 ounces (148 mL) of wine, or 1.5 ounces (44 mL) of liquor.  . If you are 91-98 years old, ask your  health care provider if you should take aspirin to prevent strokes.  . Use sunscreen. Apply sunscreen liberally and repeatedly throughout the day. You should seek shade when your shadow is shorter than you. Protect yourself by wearing long sleeves, pants, a wide-brimmed hat, and sunglasses year round, whenever you are outdoors.  . Once a month, do a whole body skin exam, using a mirror to look at the skin on your back. Tell your health care provider of new moles, moles that have irregular borders, moles that are larger than a pencil eraser, or moles that have changed in shape or color.

## 2016-11-29 NOTE — Progress Notes (Signed)
Subjective:   [name] [name] is a 83 y.o. male who presents for Medicare Annual/Subsequent preventive examination.  Review of Systems:  N/A Cardiac Risk Factors include: advanced age (>1men, >76 women);diabetes mellitus;dyslipidemia;male gender;hypertension     Objective:    Vitals: BP 138/82 (BP Location: Right Arm, Patient Position: Sitting, Cuff Size: Normal)   Pulse 82   Temp 97.5 F (36.4 C) (Oral)   Ht 5' 9.5" (1.765 m) Comment: no shoes  Wt 187 lb 8 oz (85 kg)   SpO2 98%   BMI 27.29 kg/m   Body mass index is 27.29 kg/m.  Tobacco History  Smoking Status  . Never Smoker  Smokeless Tobacco  . Never Used     Counseling given: No   Past Medical History:  Diagnosis Date  . Acute renal failure ([place]) 12/06/2013  . Back pain    occasionally  . Benign essential tremor 2005   takes Primidone daily  . Choledocholithiasis 12/06/2013  . Constipation    takes OTC stool softener  . Coronary artery disease 2007   s/p CABG, DR Johnsie Cancel  . GERD (gastroesophageal reflux disease)    takes Nexium daily  . Gout 1995   "~ once/yr" (03/17/2014)  . History of colon polyps   . Hyperlipidemia 12/2005   takes Atorvastatin daily  . Hypertension 1995   takes Lisinopril and Coreg daily  . possible HCAP (healthcare-associated pneumonia) 12/09/2013  . Prostate cancer ([place]) 2007   S/P treatment, followed by Urology prev  . Right bundle branch block (RBBB)   . Type II diabetes mellitus ([facility])    no meds;diet and exercise controlled    Past Surgical History:  Procedure Laterality Date  . Adenosine myoview  01/24/2006   Ischemia by EKG, Cath  . CATARACT EXTRACTION W/ INTRAOCULAR LENS IMPLANT Left 10/2013  . CHOLECYSTECTOMY N/A 12/06/2013   Procedure: Diagnostic Laparoscopy for  drainage Intrabdominal abcess;  Surgeon: Rolm Bookbinder, MD;  Location: [facility];  Service: General;  Laterality: N/A;  . CHOLECYSTECTOMY N/A 03/17/2014   Procedure: LAPAROSCOPIC CHOLECYSTECTOMY ;  Surgeon:  Rolm Bookbinder, MD;  Location: [facility];  Service: General;  Laterality: N/A;  . COLONOSCOPY    . CORONARY ARTERY BYPASS GRAFT  2007   CABGX 4  . CYSTOSCOPY  02/15/2004   Mod BPH, moder tribec ?  Marland Kitchen ERCP N/A 12/06/2013   Procedure: ENDOSCOPIC RETROGRADE CHOLANGIOPANCREATOGRAPHY (ERCP);  Surgeon: Milus Banister, MD;  Location: [facility];  Service: Endoscopy;  Laterality: N/A;  . LAPAROSCOPIC CHOLECYSTECTOMY  03/17/2014  . LUMBAR FUSION  02/2012   lumbar spine for spinal stenosis  . PROSTATE CRYOABLATION  09/18/2005   prostate CA   Family History  Problem Relation Age of Onset  . Heart disease Mother   . Cancer Mother   . Cancer Sister   . Diabetes Sister   . Cancer Brother     prostate  . Prostate cancer Brother   . Hypothyroidism Brother   . Depression Neg Hx   . Alcohol abuse Neg Hx   . Drug abuse Neg Hx   . Stroke Neg Hx   . Colon cancer Neg Hx    History  Sexual Activity  . Sexual activity: No    Outpatient Encounter Prescriptions as of 11/29/2016  Medication Sig  . allopurinol (ZYLOPRIM) 300 MG tablet Take 1 tablet (300 mg total) by mouth daily.  Marland Kitchen aspirin 81 MG tablet Take 81 mg by mouth daily.  Marland Kitchen atorvastatin (LIPITOR) 40 MG tablet Take 1 tablet (  40 mg total) by mouth daily.  . carvedilol (COREG CR) 20 MG 24 hr capsule Take 1 capsule (20 mg total) by mouth daily.  Marland Kitchen lisinopril (PRINIVIL,ZESTRIL) 20 MG tablet Take 1 tablet (20 mg total) by mouth daily.  . Nutritional Supplements (CARNATION BREAKFAST ESSENTIALS PO) Take by mouth. 1 a day  . omeprazole (PRILOSEC) 40 MG capsule Take 1 capsule (40 mg total) by mouth daily.  . primidone (MYSOLINE) 50 MG tablet Take 0.5 tablets (25 mg total) by mouth daily.  . tamsulosin (FLOMAX) 0.4 MG CAPS capsule Take 1 capsule (0.4 mg total) by mouth daily.   No facility-administered encounter medications on file as of 11/29/2016.     Activities of Daily Living In your present state of health, do you have any difficulty performing the  following activities: 11/29/2016  Hearing? N  Vision? N  Difficulty concentrating or making decisions? N  Walking or climbing stairs? N  Dressing or bathing? N  Doing errands, shopping? N  Preparing Food and eating ? N  Using the Toilet? N  In the past six months, have you accidently leaked urine? N  Do you have problems with loss of bowel control? N  Managing your Medications? N  Managing your Finances? N  Housekeeping or managing your Housekeeping? N  Some recent data might be hidden    Patient Care Team: Tonia Ghent, MD as PCP - General Carolan Clines, MD as Consulting Physician (Urology)   Assessment:     Hearing Screening   125Hz  250Hz  500Hz  1000Hz  2000Hz  3000Hz  4000Hz  6000Hz  8000Hz   Right ear:   40 40 40  0    Left ear:   40 40 0  0    Vision Screening Comments: Future appt scheduled March 2018 @ [facility]   Exercise Activities and Dietary recommendations Current Exercise Habits: Home exercise routine, Type of exercise: walking, Time (Minutes): 30, Frequency (Times/Week): 5, Weekly Exercise (Minutes/Week): 150, Intensity: Mild  Goals    . Increase physical activity          Starting 11/29/2016, I will continue to walk for 30 min daily.       Fall Risk Fall Risk  11/29/2016 11/28/2015 10/21/2013  Falls in the past year? No No No   Depression Screen PHQ 2/9 Scores 11/29/2016 11/28/2015 10/21/2013  PHQ - 2 Score 0 0 0    Cognitive Function MMSE - Mini Mental State Exam 11/29/2016 11/28/2015  Orientation to time 5 5  Orientation to Place 5 5  Registration 3 3  Attention/ Calculation 0 5  Recall 3 3  Language- name 2 objects 0 0  Language- repeat 1 1  Language- follow 3 step command 3 3  Language- read & follow direction 0 1  Write a sentence 0 0  Copy design 0 0  Total score 20 26       PLEASE NOTE: A Mini-Cog screen was completed. Maximum score is 20. A value of 0 denotes this part of Folstein MMSE was not completed or the patient failed  this part of the Mini-Cog screening.   Mini-Cog Screening Orientation to Time - Max 5 pts Orientation to Place - Max 5 pts Registration - Max 3 pts Recall - Max 3 pts Language Repeat - Max 1 pts Language Follow 3 Step Command - Max 3 pts   Immunization History  Administered Date(s) Administered  . Influenza Split 06/23/2012  . Influenza Whole 07/10/2005, 06/26/2007  . Influenza,inj,Quad PF,36+ Mos 06/22/2013, 06/03/2016  . Influenza-Unspecified  06/09/2014, 07/29/2015  . Pneumococcal Conjugate-13 10/28/2014  . Pneumococcal Polysaccharide-23 07/10/2005  . Td 04/09/2000, 10/18/2011   Screening Tests Health Maintenance  Topic Date Due  . OPHTHALMOLOGY EXAM  05/27/2017 (Originally 05/28/2016)  . FOOT EXAM  12/04/2016  . HEMOGLOBIN A1C  06/01/2017  . TETANUS/TDAP  10/17/2021  . INFLUENZA VACCINE  Addressed  . PNA vac Low Risk Adult  Completed      Plan:     I have personally reviewed and addressed the Medicare Annual Wellness questionnaire and have noted the following in the patient's chart:  A. Medical and social history B. Use of alcohol, tobacco or illicit drugs  C. Current medications and supplements D. Functional ability and status E.  Nutritional status F.  Physical activity G. Advance directives H. List of other physicians I.  Hospitalizations, surgeries, and ER visits in previous 12 months J.  [place] to include hearing, vision, cognitive, depression L. Referrals and appointments - none  In addition, I have reviewed and discussed with patient certain preventive protocols, quality metrics, and best practice recommendations. A written personalized care plan for preventive services as well as general preventive health recommendations were provided to patient.  See attached scanned questionnaire for additional information.   Signed,   Lindell Noe, MHA, BS, LPN Health Coach

## 2016-11-29 NOTE — Progress Notes (Addendum)
PCP notes:   Health maintenance:  Eye exam - future appt scheduled in March 2018 A1C - completed  Abnormal screenings:   Hearing - failed  Patient concerns:   None  Nurse concerns:  None  Next PCP appt:   12/10/16 @ 1045  I reviewed health advisor's note, was available for consultation on the day of service listed in this note, and agree with documentation and plan. Elsie Stain, MD.

## 2016-12-10 ENCOUNTER — Encounter: Payer: Self-pay | Admitting: Family Medicine

## 2016-12-10 ENCOUNTER — Ambulatory Visit (INDEPENDENT_AMBULATORY_CARE_PROVIDER_SITE_OTHER): Payer: Medicare Other | Admitting: Family Medicine

## 2016-12-10 VITALS — BP 140/80 | HR 62 | Temp 97.4°F | Wt 191.5 lb

## 2016-12-10 DIAGNOSIS — E038 Other specified hypothyroidism: Secondary | ICD-10-CM | POA: Diagnosis not present

## 2016-12-10 DIAGNOSIS — N183 Chronic kidney disease, stage 3 unspecified: Secondary | ICD-10-CM

## 2016-12-10 DIAGNOSIS — Z125 Encounter for screening for malignant neoplasm of prostate: Secondary | ICD-10-CM

## 2016-12-10 DIAGNOSIS — E119 Type 2 diabetes mellitus without complications: Secondary | ICD-10-CM | POA: Diagnosis not present

## 2016-12-10 DIAGNOSIS — Z8546 Personal history of malignant neoplasm of prostate: Secondary | ICD-10-CM | POA: Diagnosis not present

## 2016-12-10 DIAGNOSIS — Z7189 Other specified counseling: Secondary | ICD-10-CM | POA: Diagnosis not present

## 2016-12-10 DIAGNOSIS — E785 Hyperlipidemia, unspecified: Secondary | ICD-10-CM

## 2016-12-10 DIAGNOSIS — M109 Gout, unspecified: Secondary | ICD-10-CM

## 2016-12-10 DIAGNOSIS — I10 Essential (primary) hypertension: Secondary | ICD-10-CM | POA: Diagnosis not present

## 2016-12-10 MED ORDER — TAMSULOSIN HCL 0.4 MG PO CAPS: 0.4000 mg | ORAL_CAPSULE | Freq: Every day | ORAL | 3 refills | Status: DC

## 2016-12-10 MED ORDER — OMEPRAZOLE 40 MG PO CPDR: 40.0000 mg | DELAYED_RELEASE_CAPSULE | Freq: Every day | ORAL | 3 refills | Status: DC

## 2016-12-10 MED ORDER — ALLOPURINOL 300 MG PO TABS: 300.0000 mg | ORAL_TABLET | Freq: Every day | ORAL | 3 refills | Status: DC

## 2016-12-10 MED ORDER — PRIMIDONE 50 MG PO TABS: 25.0000 mg | ORAL_TABLET | Freq: Every day | ORAL | 3 refills | Status: DC

## 2016-12-10 NOTE — Patient Instructions (Addendum)
Check with your insurance to see if they will cover the shingles shot. Update me as needed.  Recheck PSA in about 6 weeks.  nonfasting lab visit.  Don't change your meds for now.  Take care.  Glad to see you.

## 2016-12-10 NOTE — Progress Notes (Signed)
Diabetes:  No meds Hypoglycemic episodes: only if prolonged fasting, and he has tremor at baseline.   Hyperglycemic episodes: no sx Feet problems: no Blood Sugars averaging: not checked.   eye exam within last year: due, pending, he'll check on that.   A1c controlled.   TSH wnl.  D/w pt.  No neck mass.  No tx at this point.    Tremor treated with med as is, no ADE on med. Compliant. Not worse in the meantime.  No gout flares.  No ADE on med.  Labs d/w pt. compliant with medication.  Hypertension:    Using medication without problems or lightheadedness: yes Chest pain with exertion:no Edema:no Short of breath: only with sig exertion.   He can still walk 2 miles.   Elevated Cholesterol: Using medications without problems:yes Muscle aches:  no Diet compliance: yes, "I'm trying."  Exercise: some, walking, mainly at home.    h/o prostate CA, d/w pt.  PSA elevated noted. No LUTS. No nocturia. D/w pt about options.  See plan.  Advance directive- wife designated if patient were incapacitated.  Shingles d/w pt.  He can check on coverage.    Hearing screening failed.  D/w pt.  Declined hearing aids.    PMH and SH reviewed  Meds, vitals, and allergies reviewed.   ROS: Per HPI unless specifically indicated in ROS section   GEN: nad, alert and oriented HEENT: mucous membranes moist NECK: supple w/o LA CV: rrr. PULM: ctab, no inc wob ABD: soft, +bs EXT: no edema SKIN: no acute rash DRE deferred.   Faint bilateral hand tremor noted  Diabetic foot exam: Normal inspection No skin breakdown No calluses  Normal DP pulses Normal sensation to light touch and monofilament Nails normal

## 2016-12-10 NOTE — Progress Notes (Signed)
Pre visit review using our clinic review tool, if applicable. No additional management support is needed unless otherwise documented below in the visit note. 

## 2016-12-11 NOTE — Assessment & Plan Note (Signed)
Reasonable control. Continue current medications. He agrees. Labs discussed with patient.

## 2016-12-11 NOTE — Assessment & Plan Note (Signed)
Rare flares. Doing well. Continue as is. Labs discussed with patient.

## 2016-12-11 NOTE — Assessment & Plan Note (Signed)
Advance directive- wife designated if patient were incapacitated.  

## 2016-12-11 NOTE — Assessment & Plan Note (Signed)
No meds. Discussed with patient about avoiding prolonged fasting and hypoglycemic episodes. A1c controlled. Continue as is. Recheck periodically.

## 2016-12-11 NOTE — Assessment & Plan Note (Signed)
We talked about options. PSA is up from previous. He does not have any new urinary tract symptoms. He is not certain that he would want any more aggressive treatment even if he did have a recurrence of cancer. He does want to know if he likely continues to have an active issue. We talked about options. He can recheck PSA in 6 weeks, we could refer to urology now, we can refer to urology later. He wanted to recheck PSA in 6 weeks. He wanted to consider other options in the meantime. It is not definite that he has a recurrence of prostate cancer, but he realizes this is a possibility. It could be that he has a transient and benign increase in his PSA that will self resolve. All of this was discussed with the patient, he already new about all these possibilities and he is in agreement with plan.

## 2016-12-11 NOTE — Assessment & Plan Note (Signed)
TSH wnl at this point.  D/w pt.  No neck mass.  No tx at this point.

## 2016-12-11 NOTE — Assessment & Plan Note (Signed)
Creatinine stable. Discussed with patient about labs.

## 2016-12-11 NOTE — Assessment & Plan Note (Signed)
Reasonable control. Continue as is. Labs discussed patient.

## 2016-12-12 ENCOUNTER — Other Ambulatory Visit: Payer: Self-pay | Admitting: Cardiovascular Disease

## 2017-01-21 ENCOUNTER — Other Ambulatory Visit (INDEPENDENT_AMBULATORY_CARE_PROVIDER_SITE_OTHER): Payer: Medicare Other

## 2017-01-21 DIAGNOSIS — Z125 Encounter for screening for malignant neoplasm of prostate: Secondary | ICD-10-CM | POA: Diagnosis not present

## 2017-01-21 LAB — PSA, MEDICARE: PSA: 3.84 ng/ml (ref 0.10–4.00)

## 2017-06-10 DIAGNOSIS — H35342 Macular cyst, hole, or pseudohole, left eye: Secondary | ICD-10-CM | POA: Diagnosis not present

## 2017-06-10 DIAGNOSIS — H2511 Age-related nuclear cataract, right eye: Secondary | ICD-10-CM | POA: Diagnosis not present

## 2017-06-10 DIAGNOSIS — Z9842 Cataract extraction status, left eye: Secondary | ICD-10-CM | POA: Diagnosis not present

## 2017-06-10 DIAGNOSIS — H35371 Puckering of macula, right eye: Secondary | ICD-10-CM | POA: Diagnosis not present

## 2017-06-10 LAB — HM DIABETES EYE EXAM

## 2017-06-17 ENCOUNTER — Encounter: Payer: Self-pay | Admitting: Family Medicine

## 2017-07-24 DIAGNOSIS — C61 Malignant neoplasm of prostate: Secondary | ICD-10-CM | POA: Diagnosis not present

## 2017-11-19 ENCOUNTER — Other Ambulatory Visit: Payer: Self-pay | Admitting: Cardiovascular Disease

## 2017-11-20 ENCOUNTER — Other Ambulatory Visit: Payer: Self-pay | Admitting: *Deleted

## 2017-11-20 ENCOUNTER — Encounter: Payer: Self-pay | Admitting: *Deleted

## 2017-11-20 MED ORDER — LISINOPRIL 20 MG PO TABS: 20.0000 mg | ORAL_TABLET | Freq: Every day | ORAL | 0 refills | Status: DC

## 2017-12-05 ENCOUNTER — Other Ambulatory Visit: Payer: Self-pay | Admitting: Family Medicine

## 2017-12-08 ENCOUNTER — Other Ambulatory Visit: Payer: Self-pay | Admitting: Family Medicine

## 2017-12-08 DIAGNOSIS — M109 Gout, unspecified: Secondary | ICD-10-CM

## 2017-12-08 DIAGNOSIS — E119 Type 2 diabetes mellitus without complications: Secondary | ICD-10-CM

## 2017-12-10 ENCOUNTER — Ambulatory Visit (INDEPENDENT_AMBULATORY_CARE_PROVIDER_SITE_OTHER): Payer: Medicare Other

## 2017-12-10 VITALS — BP 170/100 | HR 78 | Temp 97.7°F | Ht 69.5 in | Wt 190.0 lb

## 2017-12-10 DIAGNOSIS — M109 Gout, unspecified: Secondary | ICD-10-CM | POA: Diagnosis not present

## 2017-12-10 DIAGNOSIS — Z0000 Encounter for general adult medical examination without abnormal findings: Secondary | ICD-10-CM | POA: Diagnosis not present

## 2017-12-10 DIAGNOSIS — E119 Type 2 diabetes mellitus without complications: Secondary | ICD-10-CM | POA: Diagnosis not present

## 2017-12-10 LAB — COMPREHENSIVE METABOLIC PANEL
ALK PHOS: 69 U/L (ref 39–117)
ALT: 12 U/L (ref 0–53)
AST: 15 U/L (ref 0–37)
Albumin: 3.4 g/dL — ABNORMAL LOW (ref 3.5–5.2)
BILIRUBIN TOTAL: 0.8 mg/dL (ref 0.2–1.2)
BUN: 25 mg/dL — ABNORMAL HIGH (ref 6–23)
CALCIUM: 9.3 mg/dL (ref 8.4–10.5)
CO2: 25 meq/L (ref 19–32)
CREATININE: 1.25 mg/dL (ref 0.40–1.50)
Chloride: 104 mEq/L (ref 96–112)
GFR: 58.09 mL/min — ABNORMAL LOW (ref >60.00)
GLUCOSE: 142 mg/dL — AB (ref 70–99)
Potassium: 4.6 mEq/L (ref 3.5–5.1)
Sodium: 137 mEq/L (ref 135–145)
TOTAL PROTEIN: 7 g/dL (ref 6.0–8.3)

## 2017-12-10 LAB — LIPID PANEL
CHOL/HDL RATIO: 3
Cholesterol: 149 mg/dL (ref 0–200)
HDL: 46.3 mg/dL (ref >39.00)
LDL Cholesterol: 82 mg/dL (ref 0–99)
NONHDL: 103.02
TRIGLYCERIDES: 106 mg/dL (ref 0.0–149.0)
VLDL: 21.2 mg/dL (ref 0.0–40.0)

## 2017-12-10 LAB — HEMOGLOBIN A1C: HEMOGLOBIN A1C: 6.7 % — AB (ref 4.6–6.5)

## 2017-12-10 LAB — URIC ACID: URIC ACID, SERUM: 3.7 mg/dL — AB (ref 4.0–7.8)

## 2017-12-10 LAB — TSH: TSH: 4.77 u[IU]/mL — AB (ref 0.35–4.50)

## 2017-12-10 NOTE — Progress Notes (Signed)
Subjective:   [name] [name] is a 84 y.o. male who presents for Medicare Annual/Subsequent preventive examination.  Review of Systems:  N/A Cardiac Risk Factors include: advanced age (>84men, >39 women);male gender;dyslipidemia;hypertension;diabetes mellitus     Objective:    Vitals: BP (!) 170/100 (BP Location: Right Arm, Patient Position: Sitting, Cuff Size: Normal)   Pulse 78   Temp 97.7 F (36.5 C) (Oral)   Ht 5' 9.5" (1.765 m) Comment: no shoes  Wt 190 lb (86.2 kg)   SpO2 98%   BMI 27.66 kg/m   Body mass index is 27.66 kg/m.  Advanced Directives 12/10/2017 11/29/2016 11/28/2015 03/17/2014 03/10/2014 12/05/2013 02/22/2012  Does Patient Have a Medical Advance Directive? Yes Yes Yes Patient does not have advance directive;Patient would not like information Patient does not have advance directive;Patient would not like information Patient does not have advance directive;Patient would not like information Patient does not have advance directive  Type of Advance Directive [place];Living will [place];Living will [place]  Does patient want to make changes to medical advance directive? - - No - Patient declined - - - -  Copy of [place] in Chart? No - copy requested No - copy requested No - copy requested - - - -  Pre-existing out of facility DNR order (yellow form or pink MOST form) - - - No - - -    Tobacco Social History   Tobacco Use  Smoking Status Never Smoker  Smokeless Tobacco Never Used     Counseling given: No   Clinical Intake:  Pre-visit preparation completed: Yes  Pain : No/denies pain Pain Score: 0-No pain     Nutritional Status: BMI 25 -29 Overweight Nutritional Risks: None Diabetes: Yes CBG done?: No Did pt. bring in CBG monitor from home?: No  How often do you need to have someone help you when you read instructions, pamphlets, or other written materials from  your doctor or pharmacy?: 1 - Never What is the last grade level you completed in school?: 12th grade + some college  Interpreter Needed?: No  Comments: pt lives with spouse Information entered by :: LPinson, LPN  Past Medical History:  Diagnosis Date  . Acute renal failure ([place]) 12/06/2013  . Back pain    occasionally  . Benign essential tremor 2005   takes Primidone daily  . Choledocholithiasis 12/06/2013  . Constipation    takes OTC stool softener  . Coronary artery disease 2007   s/p CABG, DR Johnsie Cancel  . GERD (gastroesophageal reflux disease)    takes Nexium daily  . Gout 1995   "~ once/yr" (03/17/2014)  . History of colon polyps   . Hyperlipidemia 12/2005   takes Atorvastatin daily  . Hypertension 1995   takes Lisinopril and Coreg daily  . possible HCAP (healthcare-associated pneumonia) 12/09/2013  . Prostate cancer ([place]) 2007   S/P treatment, followed by Urology prev  . Right bundle branch block (RBBB)   . Type II diabetes mellitus ([facility])    no meds;diet and exercise controlled    Past Surgical History:  Procedure Laterality Date  . Adenosine myoview  01/24/2006   Ischemia by EKG, Cath  . CATARACT EXTRACTION W/ INTRAOCULAR LENS IMPLANT Left 10/2013  . CHOLECYSTECTOMY N/A 12/06/2013   Procedure: Diagnostic Laparoscopy for  drainage Intrabdominal abcess;  Surgeon: Rolm Bookbinder, MD;  Location: [facility];  Service: General;  Laterality: N/A;  . CHOLECYSTECTOMY N/A 03/17/2014  Procedure: LAPAROSCOPIC CHOLECYSTECTOMY ;  Surgeon: Rolm Bookbinder, MD;  Location: [facility];  Service: General;  Laterality: N/A;  . COLONOSCOPY    . CORONARY ARTERY BYPASS GRAFT  2007   CABGX 4  . CYSTOSCOPY  02/15/2004   Mod BPH, moder tribec ?  Marland Kitchen ERCP N/A 12/06/2013   Procedure: ENDOSCOPIC RETROGRADE CHOLANGIOPANCREATOGRAPHY (ERCP);  Surgeon: Milus Banister, MD;  Location: [facility];  Service: Endoscopy;  Laterality: N/A;  . LAPAROSCOPIC CHOLECYSTECTOMY  03/17/2014  . LUMBAR FUSION  02/2012   lumbar  spine for spinal stenosis  . PROSTATE CRYOABLATION  09/18/2005   prostate CA   Family History  Problem Relation Age of Onset  . Heart disease Mother   . Cancer Mother   . Cancer Sister   . Diabetes Sister   . Cancer Brother        prostate  . Prostate cancer Brother   . Hypothyroidism Brother   . Depression Neg Hx   . Alcohol abuse Neg Hx   . Drug abuse Neg Hx   . Stroke Neg Hx   . Colon cancer Neg Hx    Social History   Socioeconomic History  . Marital status: Married    Spouse name: Not on file  . Number of children: 1  . Years of education: Not on file  . Highest education level: Not on file  Occupational History  . Occupation: Retired Pharmacist, community  Social Needs  . Financial resource strain: Not on file  . Food insecurity:    Worry: Not on file    Inability: Not on file  . Transportation needs:    Medical: Not on file    Non-medical: Not on file  Tobacco Use  . Smoking status: Never Smoker  . Smokeless tobacco: Never Used  Substance and Sexual Activity  . Alcohol use: No  . Drug use: No  . Sexual activity: Never  Lifestyle  . Physical activity:    Days per week: Not on file    Minutes per session: Not on file  . Stress: Not on file  Relationships  . Social connections:    Talks on phone: Not on file    Gets together: Not on file    Attends religious service: Not on file    Active member of club or organization: Not on file    Attends meetings of clubs or organizations: Not on file    Relationship status: Not on file  Other Topics Concern  . 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 [employer]   Agent orange exposure    Outpatient Encounter Medications as of 12/10/2017  Medication Sig  . allopurinol (ZYLOPRIM) 300 MG tablet TAKE 1 TABLET DAILY  . aspirin 81 MG tablet Take 81 mg by mouth daily.  Marland Kitchen atorvastatin (LIPITOR) 40 MG tablet TAKE 1 TABLET DAILY  . carvedilol (COREG CR)  20 MG 24 hr capsule TAKE 1 CAPSULE DAILY  . lisinopril (PRINIVIL,ZESTRIL) 20 MG tablet Take 1 tablet (20 mg total) by mouth daily.  . Nutritional Supplements (CARNATION BREAKFAST ESSENTIALS PO) Take by mouth. 1 a day  . omeprazole (PRILOSEC) 40 MG capsule Take 1 capsule (40 mg total) by mouth daily.  . primidone (MYSOLINE) 50 MG tablet TAKE ONE-HALF (1/2) TABLET DAILY  . tamsulosin (FLOMAX) 0.4 MG CAPS capsule TAKE 1 CAPSULE DAILY   No facility-administered encounter medications on file as of 12/10/2017.  Activities of Daily Living In your present state of health, do you have any difficulty performing the following activities: 12/10/2017  Hearing? N  Vision? N  Difficulty concentrating or making decisions? N  Walking or climbing stairs? N  Dressing or bathing? N  Doing errands, shopping? N  Preparing Food and eating ? N  Using the Toilet? N  In the past six months, have you accidently leaked urine? N  Do you have problems with loss of bowel control? N  Managing your Medications? N  Managing your Finances? N  Housekeeping or managing your Housekeeping? N  Some recent data might be hidden    Patient Care Team: Tonia Ghent, MD as PCP - General Carolan Clines, MD as Consulting Physician (Urology)   Assessment:   This is a routine wellness examination for [place].   Hearing Screening   125Hz  250Hz  500Hz  1000Hz  2000Hz  3000Hz  4000Hz  6000Hz  8000Hz   Right ear:   40 40 40  0    Left ear:   40 40 0  0    Vision Screening Comments: October 2018 - Dr. Maryruth Hancock B.   Exercise Activities and Dietary recommendations Current Exercise Habits: The patient does not participate in regular exercise at present, Exercise limited by: None identified  Goals    . Increase physical activity     Starting 12/10/2017, I will continue to walk for 30 min daily.        Fall Risk Fall Risk  12/10/2017 12/10/2016 11/29/2016 11/28/2015 10/21/2013  Falls in the past year? No No No No No  Depression  Screen PHQ 2/9 Scores 12/10/2017 12/10/2016 11/29/2016 11/28/2015  PHQ - 2 Score 0 0 0 0  PHQ- 9 Score 0 - - -    Cognitive Function MMSE - Mini Mental State Exam 12/10/2017 11/29/2016 11/28/2015  Orientation to time 5 5 5   Orientation to Place 5 5 5   Registration 3 3 3   Attention/ Calculation 0 0 5  Recall 3 3 3   Language- name 2 objects 0 0 0  Language- repeat 1 1 1   Language- follow 3 step command 3 3 3   Language- read & follow direction 0 0 1  Write a sentence 0 0 0  Copy design 0 0 0  Total score 20 20 26      PLEASE NOTE: A Mini-Cog screen was completed. Maximum score is 20. A value of 0 denotes this part of [place] MMSE was not completed or the patient failed this part of the Mini-Cog screening.   Mini-Cog Screening Orientation to Time - Max 5 pts Orientation to Place - Max 5 pts Registration - Max 3 pts Recall - Max 3 pts Language Repeat - Max 1 pts Language Follow 3 Step Command - Max 3 pts     Immunization History  Administered Date(s) Administered  . Influenza Split 06/23/2012  . Influenza Whole 07/10/2005, 06/26/2007  . Influenza, High Dose Seasonal PF 06/09/2017  . Influenza,inj,Quad PF,6+ Mos 06/22/2013, 06/03/2016  . Influenza-Unspecified 06/09/2014, 07/29/2015  . Pneumococcal Conjugate-13 10/28/2014  . Pneumococcal Polysaccharide-23 07/10/2005  . Td 04/09/2000, 10/18/2011    reening Tests Health Maintenance  Topic Date Due  . FOOT EXAM  12/25/2017 (Originally 12/10/2017)  . INFLUENZA VACCINE  04/09/2018  . OPHTHALMOLOGY EXAM  06/10/2018  . HEMOGLOBIN A1C  06/11/2018  . TETANUS/TDAP  10/17/2021  . PNA vac Low Risk Adult  Completed      Plan:   I have personally reviewed, addressed, and noted the following in the patient's chart:  A. Medical and social history B. Use of alcohol, tobacco or illicit drugs  C. Current medications and supplements D. Functional ability and status E.  Nutritional status F.  Physical activity G. Advance directives H. List  of other physicians I.  Hospitalizations, surgeries, and ER visits in previous 12 months J.  [place] to include hearing, vision, cognitive, depression L. Referrals and appointments - none  In addition, I have reviewed and discussed with patient certain preventive protocols, quality metrics, and best practice recommendations. A written personalized care plan for preventive services as well as general preventive health recommendations were provided to patient.  See attached scanned questionnaire for additional information.   Signed,   Lindell Noe, MHA, BS, LPN Health Coach

## 2017-12-10 NOTE — Progress Notes (Signed)
PCP notes:   Health maintenance:  Foot exam - PCP please address at next appt A1C - completed  Abnormal screenings:   Hearing - failed  Hearing Screening   125Hz  250Hz  500Hz  1000Hz  2000Hz  3000Hz  4000Hz  6000Hz  8000Hz   Right ear:   40 40 40  0    Left ear:   40 40 0  0     Patient concerns:   None  Nurse concerns:  None  Next PCP appt:   12/25/17 @ 1030

## 2017-12-10 NOTE — Patient Instructions (Signed)
[name] [name] , Thank you for taking time to come for your Medicare Wellness Visit. I appreciate your ongoing commitment to your health goals. Please review the following plan we discussed and let me know if I can assist you in the future.   These are the goals we discussed: Goals    . Increase physical activity     Starting 12/10/2017, I will continue to walk for 30 min daily.        This is a list of the screening recommended for you and due dates:  Health Maintenance  Topic Date Due  . Complete foot exam   12/25/2017*  . Flu Shot  04/09/2018  . Eye exam for diabetics  06/10/2018  . Hemoglobin A1C  06/11/2018  . Tetanus Vaccine  10/17/2021  . Pneumonia vaccines  Completed  *Topic was postponed. The date shown is not the original due date.   Preventive Care for Adults  A healthy lifestyle and preventive care can promote health and wellness. Preventive health guidelines for adults include the following key practices.  . A routine yearly physical is a good way to check with your health care provider about your health and preventive screening. It is a chance to share any concerns and updates on your health and to receive a thorough exam.  . Visit your dentist for a routine exam and preventive care every 6 months. Brush your teeth twice a day and floss once a day. Good oral hygiene prevents tooth decay and gum disease.  . The frequency of eye exams is based on your age, health, family medical history, use  of contact lenses, and other factors. Follow your health care provider's recommendations for frequency of eye exams.  . Eat a healthy diet. Foods like vegetables, fruits, whole grains, low-fat dairy products, and lean protein foods contain the nutrients you need without too many calories. Decrease your intake of foods high in solid fats, added sugars, and salt. Eat the right amount of calories for you. Get information about a proper diet from your health care provider, if  necessary.  . Regular physical exercise is one of the most important things you can do for your health. Most adults should get at least 150 minutes of moderate-intensity exercise (any activity that increases your heart rate and causes you to sweat) each week. In addition, most adults need muscle-strengthening exercises on 2 or more days a week.  Silver Sneakers may be a benefit available to you. To determine eligibility, you may visit the website: www.silversneakers.com or contact program at 616-794-4420 Mon-Fri between 8AM-8PM.   . Maintain a healthy weight. The body mass index (BMI) is a screening tool to identify possible weight problems. It provides an estimate of body fat based on height and weight. Your health care provider can find your BMI and can help you achieve or maintain a healthy weight.   For adults 20 years and older: ? A BMI below 18.5 is considered underweight. ? A BMI of 18.5 to 24.9 is normal. ? A BMI of 25 to 29.9 is considered overweight. ? A BMI of 30 and above is considered obese.   . Maintain normal blood lipids and cholesterol levels by exercising and minimizing your intake of saturated fat. Eat a balanced diet with plenty of fruit and vegetables. Blood tests for lipids and cholesterol should begin at age 101 and be repeated every 5 years. If your lipid or cholesterol levels are high, you are over 50, or you are at high  risk for heart disease, you may need your cholesterol levels checked more frequently. Ongoing high lipid and cholesterol levels should be treated with medicines if diet and exercise are not working.  . If you smoke, find out from your health care provider how to quit. If you do not use tobacco, please do not start.  . If you choose to drink alcohol, please do not consume more than 2 drinks per day. One drink is considered to be 12 ounces (355 mL) of beer, 5 ounces (148 mL) of wine, or 1.5 ounces (44 mL) of liquor.  . If you are 10-62 years old, ask your  health care provider if you should take aspirin to prevent strokes.  . Use sunscreen. Apply sunscreen liberally and repeatedly throughout the day. You should seek shade when your shadow is shorter than you. Protect yourself by wearing long sleeves, pants, a wide-brimmed hat, and sunglasses year round, whenever you are outdoors.  . Once a month, do a whole body skin exam, using a mirror to look at the skin on your back. Tell your health care provider of new moles, moles that have irregular borders, moles that are larger than a pencil eraser, or moles that have changed in shape or color.

## 2017-12-13 NOTE — Progress Notes (Signed)
I reviewed health advisor's note, was available for consultation, and agree with documentation and plan. Sent note to Dr. Damita Dunnings regarding patient's elevated BP and failed hearing test. Patient has upcoming appointment with Dr. Damita Dunnings.

## 2017-12-18 ENCOUNTER — Other Ambulatory Visit: Payer: Medicare Other

## 2017-12-22 ENCOUNTER — Other Ambulatory Visit: Payer: Medicare Other

## 2017-12-25 ENCOUNTER — Ambulatory Visit (INDEPENDENT_AMBULATORY_CARE_PROVIDER_SITE_OTHER): Payer: Medicare Other | Admitting: Family Medicine

## 2017-12-25 ENCOUNTER — Encounter: Payer: Self-pay | Admitting: Family Medicine

## 2017-12-25 VITALS — BP 148/68 | HR 78 | Temp 97.7°F | Ht 69.5 in | Wt 190.0 lb

## 2017-12-25 DIAGNOSIS — E119 Type 2 diabetes mellitus without complications: Secondary | ICD-10-CM | POA: Diagnosis not present

## 2017-12-25 DIAGNOSIS — E038 Other specified hypothyroidism: Secondary | ICD-10-CM

## 2017-12-25 DIAGNOSIS — K219 Gastro-esophageal reflux disease without esophagitis: Secondary | ICD-10-CM | POA: Diagnosis not present

## 2017-12-25 DIAGNOSIS — M109 Gout, unspecified: Secondary | ICD-10-CM | POA: Diagnosis not present

## 2017-12-25 DIAGNOSIS — R7989 Other specified abnormal findings of blood chemistry: Secondary | ICD-10-CM

## 2017-12-25 DIAGNOSIS — Z7189 Other specified counseling: Secondary | ICD-10-CM

## 2017-12-25 DIAGNOSIS — I10 Essential (primary) hypertension: Secondary | ICD-10-CM

## 2017-12-25 DIAGNOSIS — Z8546 Personal history of malignant neoplasm of prostate: Secondary | ICD-10-CM | POA: Diagnosis not present

## 2017-12-25 DIAGNOSIS — Z125 Encounter for screening for malignant neoplasm of prostate: Secondary | ICD-10-CM | POA: Diagnosis not present

## 2017-12-25 DIAGNOSIS — E785 Hyperlipidemia, unspecified: Secondary | ICD-10-CM | POA: Diagnosis not present

## 2017-12-25 DIAGNOSIS — R251 Tremor, unspecified: Secondary | ICD-10-CM | POA: Diagnosis not present

## 2017-12-25 DIAGNOSIS — I2510 Atherosclerotic heart disease of native coronary artery without angina pectoris: Secondary | ICD-10-CM

## 2017-12-25 DIAGNOSIS — Z0000 Encounter for general adult medical examination without abnormal findings: Secondary | ICD-10-CM

## 2017-12-25 LAB — PSA, MEDICARE: PSA: 3.52 ng/mL (ref 0.10–4.00)

## 2017-12-25 MED ORDER — LISINOPRIL 20 MG PO TABS: 20.0000 mg | ORAL_TABLET | Freq: Every day | ORAL | 3 refills | Status: DC

## 2017-12-25 MED ORDER — CARVEDILOL PHOSPHATE ER 20 MG PO CP24: 20.0000 mg | ORAL_CAPSULE | Freq: Every day | ORAL | 3 refills | Status: DC

## 2017-12-25 MED ORDER — OMEPRAZOLE 40 MG PO CPDR: 40.0000 mg | DELAYED_RELEASE_CAPSULE | Freq: Every day | ORAL | 3 refills | Status: DC

## 2017-12-25 MED ORDER — ATORVASTATIN CALCIUM 40 MG PO TABS: 40.0000 mg | ORAL_TABLET | Freq: Every day | ORAL | 3 refills | Status: DC

## 2017-12-25 NOTE — Progress Notes (Signed)
Diabetes:  No meds.   Hypoglycemic episodes: rarely and only if prolonged fasting   Hyperglycemic episodes: no Feet problems:no Blood Sugars averaging: not checked.   eye exam within last year: yes A1c d/w pt.  At goal.    GERD controlled on PPI, no ADE on med.  Cr improved.  D/w pt.    Advance directive- wife designated if patient were incapacitated.   Hearing - failed.  D/w pt about hearing aids.  He can consider.    PSA testing d/w pt.  Hx prostate cancer in the distant past.  To be drawn today.  He hasn't seen urology since 07/2017.  D/w pt.  See notes on labs.    Mild inc in TSH, likely not at the point of needing treatment.  No TMG on exam.    D/w pt about routine cards f/u.  No CP.  He can call about a f/u appointment.    Hypertension:    Using medication without problems or lightheadedness: rarely lightheaded.  Only if sudden change in position, brief, mild.  D/w pt.   Chest pain with exertion:no Edema:no Short of breath:no BP is clearly better today.  D/w pt.   Elevated Cholesterol: Using medications without problems:yes Muscle aches: no Diet compliance: encouraged, "it's too good."   Exercise:yes, walking.  Labs d/w pt.    Hyperuricemia.  No recent gout flares.  Still on allopurinol.  Compliant.  Labs d/w pt.    Tremor.  Still on primidone.  No ADE on med with some effect.  Compliant. Swallowing well.    PMH and SH reviewed  Meds, vitals, and allergies reviewed.   ROS: Per HPI unless specifically indicated in ROS section   GEN: nad, alert and oriented HEENT: mucous membranes moist NECK: supple w/o LA CV: rrr. PULM: ctab, no inc wob ABD: soft, +bs EXT: no edema SKIN: no acute rash No tremor at rest, mild with hand extension.    Diabetic foot exam: Normal inspection No skin breakdown No calluses  Normal DP pulses Normal sensation to light touch and monofilament Nails normal

## 2017-12-25 NOTE — Patient Instructions (Addendum)
Go to the lab on the way out.  We'll contact you with your lab report. If you are more lightheaded, then let me know about we'll likely trim your BP medicines.   Call about an appointment with Dr. Johnsie Cancel when possible.  Let me know if you need help getting an appointment.  Take care.  Glad to see you.

## 2017-12-28 DIAGNOSIS — R7989 Other specified abnormal findings of blood chemistry: Secondary | ICD-10-CM | POA: Insufficient documentation

## 2017-12-28 DIAGNOSIS — Z0000 Encounter for general adult medical examination without abnormal findings: Secondary | ICD-10-CM | POA: Insufficient documentation

## 2017-12-28 DIAGNOSIS — K219 Gastro-esophageal reflux disease without esophagitis: Secondary | ICD-10-CM | POA: Insufficient documentation

## 2017-12-28 NOTE — Assessment & Plan Note (Signed)
GERD controlled on PPI, no ADE on med.  Cr improved.  D/w pt.

## 2017-12-28 NOTE — Assessment & Plan Note (Signed)
Still on primidone.  No ADE on med with some effect.  Compliant. Swallowing well.   Offered neurology evaluation.  He can consider.  If worsening he will let me know.  He did not want to see neurology at this point.

## 2017-12-28 NOTE — Assessment & Plan Note (Signed)
A1c at goal.  No medication.  Routine cautions discussed with patient.  Recheck periodically.

## 2017-12-28 NOTE — Assessment & Plan Note (Addendum)
Mild inc in TSH, likely not at the point of needing treatment.  No TMG on exam.   Continue with observation.  He will update me as needed.

## 2017-12-28 NOTE — Assessment & Plan Note (Signed)
Hearing - failed.  D/w pt about hearing aids.  He can consider.

## 2017-12-28 NOTE — Assessment & Plan Note (Signed)
D/w pt about routine cards f/u.  No CP.  He can call about a f/u appointment.

## 2017-12-28 NOTE — Assessment & Plan Note (Signed)
No recent gout flares.  Still on allopurinol.  Compliant.  Labs d/w pt.  Continue as is.

## 2017-12-28 NOTE — Assessment & Plan Note (Signed)
PSA testing d/w pt.  Hx prostate cancer in the distant past.  To be drawn today.  He hasn't seen urology since 07/2017.  D/w pt.  See notes on labs.

## 2017-12-28 NOTE — Assessment & Plan Note (Signed)
Advance directive- wife designated if patient were incapacitated.  

## 2017-12-28 NOTE — Assessment & Plan Note (Signed)
Mild inc in TSH, likely not at the point of needing treatment.  No TMG on exam.  Continue observation for now.  Discussed with patient.  He agrees.

## 2017-12-28 NOTE — Assessment & Plan Note (Signed)
Blood pressure was clearly improved today.  Continue as is for now.  If he continues to have difficulties with lightheadedness that he can update me and we can taper his blood pressure medications.  Labs discussed with patient.  He agrees.

## 2017-12-28 NOTE — Assessment & Plan Note (Signed)
Lipids are reasonable.  Continue as is.  Labs discussed with patient.  Continue working on diet and exercise.  If he sticks with his diet a little more his lipids would likely be a little better, discussed.

## 2018-05-17 ENCOUNTER — Telehealth: Payer: Self-pay | Admitting: Family Medicine

## 2018-05-17 DIAGNOSIS — Z125 Encounter for screening for malignant neoplasm of prostate: Secondary | ICD-10-CM

## 2018-05-17 NOTE — Telephone Encounter (Signed)
Call to check on follow up plans.   I had talked to Dr. Gloriann Loan. Reasonable to recheck PSA.   If he doesn't want to f/u with Dr. Gloriann Loan, then okay to get the lab drawn here.  If higher on recheck, then I would re-send him to Dr. Gloriann Loan.  Let me know which way he wants to go so I can put in the order (if needed) for PSA.  Thanks.

## 2018-05-18 NOTE — Telephone Encounter (Signed)
Ordered. Thanks

## 2018-05-18 NOTE — Telephone Encounter (Signed)
Patient would like to get labs drawn here in the clinic.  Please put in the order, he wants to come on 05/19/18.

## 2018-05-18 NOTE — Addendum Note (Signed)
Addended by: Tonia Ghent on: 05/18/2018 11:37 PM   Modules accepted: Orders

## 2018-05-19 ENCOUNTER — Other Ambulatory Visit (INDEPENDENT_AMBULATORY_CARE_PROVIDER_SITE_OTHER): Payer: Medicare Other

## 2018-05-19 DIAGNOSIS — Z125 Encounter for screening for malignant neoplasm of prostate: Secondary | ICD-10-CM | POA: Diagnosis not present

## 2018-05-19 LAB — PSA, MEDICARE: PSA: 4.13 ng/mL — AB (ref 0.10–4.00)

## 2018-05-21 NOTE — Telephone Encounter (Signed)
Pt is calling in wanting lab results

## 2018-05-21 NOTE — Telephone Encounter (Signed)
Patient is advised that no results are available at this time but he will be called as soon as they come in.

## 2018-05-22 ENCOUNTER — Encounter: Payer: Self-pay | Admitting: *Deleted

## 2018-05-26 ENCOUNTER — Telehealth: Payer: Self-pay | Admitting: Cardiovascular Disease

## 2018-05-26 NOTE — Telephone Encounter (Signed)
New Message   STAT if patient feels like he/she is going to faint   1) Are you dizzy now? no  2) Do you feel faint or have you passed out? Just dizzy  3) Do you have any other symptoms? Has dizziness when he gets up and walks around  4) Have you checked your HR and BP (record if available)?

## 2018-05-26 NOTE — Telephone Encounter (Signed)
[id number] 9/17

## 2018-05-26 NOTE — Telephone Encounter (Signed)
Spoke to patient about his dizzy spells.  He will start recording his HR and BP, along with documenting symptoms.  I scheduled an appt with Dr Johnsie Cancel in November to f/u.  He will call if needs to be seen sooner.

## 2018-06-06 DIAGNOSIS — R05 Cough: Secondary | ICD-10-CM | POA: Diagnosis not present

## 2018-06-06 DIAGNOSIS — J40 Bronchitis, not specified as acute or chronic: Secondary | ICD-10-CM | POA: Diagnosis not present

## 2018-06-11 DIAGNOSIS — C61 Malignant neoplasm of prostate: Secondary | ICD-10-CM | POA: Diagnosis not present

## 2018-07-02 ENCOUNTER — Ambulatory Visit (INDEPENDENT_AMBULATORY_CARE_PROVIDER_SITE_OTHER): Payer: Medicare Other

## 2018-07-02 DIAGNOSIS — Z23 Encounter for immunization: Secondary | ICD-10-CM | POA: Diagnosis not present

## 2018-07-13 NOTE — Progress Notes (Signed)
Patient ID: [name] [name], male   DOB: 1929/06/08, 84 y.o.   MRN: [id number]      84 y.o.  Last seen 11/08/15  Originally from [place]  Had CABG in 2006 by Dr Darcey Nora.  No recent problems with SSCP, dyspnea or palpitations. Compliant with meds. Has DM noted since 2017. Has increasing lower back pain that limits mobility. CRF;s otherwise HTN and elevated lipids. Has BP cuff at home but not sure it is accurate. Had been running around 412 systolic Compliant with meds. Limited exercise.    Some dizzyness with exertion postural.  Previous anginal symptoms dyspnea Also some fatigue   Myovue 05/09/15  Low risk but abnormal   Nuclear stress EF: 58%.  Defect 1: There is a small defect of mild severity present in the apical inferior and apical lateral location.  This is a low risk study.  Having some dizzness at times postural Gait more broad based and difficult   ROS: Denies fever, malais, weight loss, blurry vision, decreased visual acuity, cough, sputum, SOB, hemoptysis, pleuritic pain, palpitaitons, heartburn, abdominal pain, melena, lower extremity edema, claudication, or rash.  All other systems reviewed and negative  General: BP (!) 152/78   Pulse (!) 58   Ht 5' 9.5" (1.765 m)   Wt 182 lb 4 oz (82.7 kg)   SpO2 99%   BMI 26.53 kg/m  Affect appropriate Elderly male  HEENT: normal Neck supple with no adenopathy JVP normal no bruits no thyromegaly Lungs clear with no wheezing and good diaphragmatic motion Heart:  S1/S2 no murmur, no rub, gallop or click PMI normal Abdomen: benighn, BS positve, no tenderness, no AAA no bruit.  No HSM or HJR Distal pulses intact with no bruits No edema Neuro non-focal Skin warm and dry No muscular weakness    Current Outpatient Medications  Medication Sig Dispense Refill  . allopurinol (ZYLOPRIM) 300 MG tablet TAKE 1 TABLET DAILY 90 tablet 3  . aspirin 81 MG tablet Take 81 mg by mouth daily.    Marland Kitchen atorvastatin (LIPITOR) 40 MG  tablet Take 1 tablet (40 mg total) by mouth daily. 90 tablet 3  . carvedilol (COREG CR) 20 MG 24 hr capsule Take 1 capsule (20 mg total) by mouth daily. 90 capsule 3  . lisinopril (PRINIVIL,ZESTRIL) 20 MG tablet Take 1 tablet (20 mg total) by mouth daily. 90 tablet 3  . Nutritional Supplements (CARNATION BREAKFAST ESSENTIALS PO) Take by mouth. 1 a day    . omeprazole (PRILOSEC) 40 MG capsule Take 1 capsule (40 mg total) by mouth daily. 90 capsule 3  . primidone (MYSOLINE) 50 MG tablet TAKE ONE-HALF (1/2) TABLET DAILY 45 tablet 3  . tamsulosin (FLOMAX) 0.4 MG CAPS capsule TAKE 1 CAPSULE DAILY 90 capsule 3   No current facility-administered medications for this visit.     Allergies  Sulfonamide derivatives  Electrocardiogram:    05/01/15  SR rate 60  PR 214  RBBB chronic  07/22/18  SR RBBB PAC no changes   Assessment and Plan  CAD/CABG 2006 :  Low risk myovue 05/09/15  no ischemia EF preserved continue medical Rx    HTN: Well controlled.  Continue current medications and low sodium Dash type diet.   Tolerate Slightly higher systolic as he has a history of postural symptoms   Chol: labs with Dr Damita Dunnings  Close to goal given age continue current dose of lipitor  Cholesterol is at goal.  Continue current dose of statin and diet Rx.  No  myalgias or side effects.  F/U  LFT's in 6 months. Lab Results  Component Value Date   LDLCALC 82 12/10/2017   Prostate CA  PSA slowly rising f/u urology / primary  Lab Results  Component Value Date   PSA 4.13 (H) 05/19/2018   PSA 3.52 12/25/2017   PSA 3.84 01/21/2017    Tremor  Continue mysoline  Mostly in hands given postural symptoms and gait suggested he F/u with neurologist He was not postural in office today with systolic BP 622 sittin and 130 mmHg Standing     [name]

## 2018-07-22 ENCOUNTER — Ambulatory Visit (INDEPENDENT_AMBULATORY_CARE_PROVIDER_SITE_OTHER): Payer: Medicare Other | Admitting: Cardiovascular Disease

## 2018-07-22 ENCOUNTER — Encounter: Payer: Self-pay | Admitting: Cardiovascular Disease

## 2018-07-22 VITALS — BP 152/78 | HR 58 | Ht 69.5 in | Wt 182.2 lb

## 2018-07-22 DIAGNOSIS — I25810 Atherosclerosis of coronary artery bypass graft(s) without angina pectoris: Secondary | ICD-10-CM

## 2018-07-22 DIAGNOSIS — R42 Dizziness and giddiness: Secondary | ICD-10-CM | POA: Diagnosis not present

## 2018-07-22 DIAGNOSIS — I10 Essential (primary) hypertension: Secondary | ICD-10-CM

## 2018-07-22 DIAGNOSIS — E785 Hyperlipidemia, unspecified: Secondary | ICD-10-CM | POA: Diagnosis not present

## 2018-07-22 NOTE — Patient Instructions (Addendum)

## 2018-12-17 ENCOUNTER — Ambulatory Visit: Payer: Medicare Other

## 2018-12-18 ENCOUNTER — Ambulatory Visit: Payer: Medicare Other

## 2018-12-21 ENCOUNTER — Encounter: Payer: Medicare Other | Admitting: Family Medicine

## 2018-12-28 ENCOUNTER — Telehealth: Payer: Self-pay

## 2018-12-28 NOTE — Telephone Encounter (Signed)
Pt left v/m requesting cb about his med refills; CPX changed to 05/18/19, last seen 12/2017. Pt scheduled virtual visit 12/29/18 at 2 pm for med refills. Pt will have nurse visit 12/29/18 at 10:30 for BP ck Valinda Hoar is aware). FYI to Dr Damita Dunnings.ri

## 2018-12-29 ENCOUNTER — Ambulatory Visit (INDEPENDENT_AMBULATORY_CARE_PROVIDER_SITE_OTHER): Payer: Medicare Other | Admitting: Family Medicine

## 2018-12-29 ENCOUNTER — Other Ambulatory Visit: Payer: Self-pay

## 2018-12-29 ENCOUNTER — Ambulatory Visit: Payer: Medicare Other

## 2018-12-29 ENCOUNTER — Encounter: Payer: Self-pay | Admitting: Family Medicine

## 2018-12-29 VITALS — BP 150/80 | HR 58 | Temp 97.6°F | Ht 69.5 in | Wt 191.5 lb

## 2018-12-29 DIAGNOSIS — M109 Gout, unspecified: Secondary | ICD-10-CM

## 2018-12-29 DIAGNOSIS — I10 Essential (primary) hypertension: Secondary | ICD-10-CM | POA: Diagnosis not present

## 2018-12-29 DIAGNOSIS — E038 Other specified hypothyroidism: Secondary | ICD-10-CM

## 2018-12-29 DIAGNOSIS — E119 Type 2 diabetes mellitus without complications: Secondary | ICD-10-CM

## 2018-12-29 DIAGNOSIS — Z8546 Personal history of malignant neoplasm of prostate: Secondary | ICD-10-CM

## 2018-12-29 DIAGNOSIS — E785 Hyperlipidemia, unspecified: Secondary | ICD-10-CM | POA: Diagnosis not present

## 2018-12-29 DIAGNOSIS — R251 Tremor, unspecified: Secondary | ICD-10-CM | POA: Diagnosis not present

## 2018-12-29 DIAGNOSIS — Z125 Encounter for screening for malignant neoplasm of prostate: Secondary | ICD-10-CM | POA: Diagnosis not present

## 2018-12-29 NOTE — Telephone Encounter (Signed)
Noted. Thanks.

## 2018-12-29 NOTE — Progress Notes (Signed)
Virtual visit completed through WebEx or similar program Patient location: home  Provider location: [place] [facility], office   Limitations and rationale for visit method d/w patient.  Patient agreed to proceed.   CC: follow up   HPI:  Gout.  No recent gout flares.  Still on allopurinol.  Compliant.  No adverse effect on medication.  Elevated Cholesterol: Using medications without problems: yes Muscle aches: no Diet compliance: yes Exercise: encouraged  Due for labs.  Hypertension:    Using medication without problems or lightheadedness: yes Chest pain with exertion: no Edema:no Short of breath:no Average home BPs: Due for labs.    Tremor.  Stable sx on primidone.  No falls.  No dysphagia.  Fall cautions d/w pt.  He has some balance changes.  D/w PT vs walker.  He is using a cane some.  Would defer PT given pandemic consideration.  Would be reasonable to try walking more with walker to try to get stronger.  Discussed home exercises, with supervision.     PSA testing d/w pt.  Hx prostate cancer in the distant past. Due for repeat PSA.    Diabetes:  No meds. Hypoglycemic episodes: no sx Hyperglycemic episodes: no sx Feet problems: no  Blood Sugars averaging: not checked.  eye exam within last year: due, when possible.    TSH elevation.  Due for repeat TSH.  Had not needed treatment for mild TSH elevation previously.  No thyromegaly noted by patient.  Meds and allergies reviewed.   ROS: Per HPI unless specifically indicated in ROS section   NAD Speech wnl  A/P:  Gout.  No recent gout flares.  Still on allopurinol.  Compliant.  No adverse effect on medication.  Elevated Cholesterol: Due for labs.  Continue current medication as is.  Hypertension:  Due for labs.  Continue current medication as is.  Tremor.  Stable sx on primidone.  No falls.  No dysphagia.  Fall cautions d/w pt.  He has some balance changes.  D/w PT vs walker.  He is using a cane some.  Would  defer PT given pandemic consideration.  Would be reasonable to try walking more with walker to try to get stronger.  Discussed home exercises, with supervision.     PSA testing d/w pt.  Hx prostate cancer in the distant past. Due for repeat PSA.   Diabetes: No meds.  Due for labs.  Discussed diet.  TSH elevation.  Due for repeat TSH.  He will come in for labs and we will go from there.  Lab visit on 01/04/2019 at [place].  He'll come in fasting.   He has enough meds for now.    >25 minutes spent in face to face time with patient, >50% spent in counselling or coordination of care.

## 2018-12-30 DIAGNOSIS — Z125 Encounter for screening for malignant neoplasm of prostate: Secondary | ICD-10-CM | POA: Insufficient documentation

## 2018-12-30 NOTE — Assessment & Plan Note (Signed)
TSH elevation.  Due for repeat TSH.  He will come in for labs and we will go from there.

## 2018-12-30 NOTE — Assessment & Plan Note (Signed)
PSA testing d/w pt.  Hx prostate cancer in the distant past. Due for repeat PSA.

## 2018-12-30 NOTE — Assessment & Plan Note (Signed)
Diabetes: No meds.  Due for labs.  Discussed diet.

## 2018-12-30 NOTE — Assessment & Plan Note (Signed)
Due for labs.  Continue current medication as is.

## 2018-12-30 NOTE — Assessment & Plan Note (Signed)
No recent gout flares.  Still on allopurinol.  Compliant.  No adverse effect on medication.

## 2018-12-30 NOTE — Assessment & Plan Note (Signed)
Stable sx on primidone.  No falls.  No dysphagia.  Fall cautions d/w pt.  He has some balance changes.  D/w PT vs walker.  He is using a cane some.  Would defer PT given pandemic consideration.  Would be reasonable to try walking more with walker to try to get stronger.  Discussed home exercises, with supervision.

## 2018-12-30 NOTE — Assessment & Plan Note (Signed)
Hypertension:  Due for labs.  Continue current medication as is.

## 2019-01-04 ENCOUNTER — Other Ambulatory Visit: Payer: Self-pay

## 2019-01-04 ENCOUNTER — Other Ambulatory Visit (INDEPENDENT_AMBULATORY_CARE_PROVIDER_SITE_OTHER): Payer: Medicare Other

## 2019-01-04 DIAGNOSIS — M109 Gout, unspecified: Secondary | ICD-10-CM | POA: Diagnosis not present

## 2019-01-04 DIAGNOSIS — Z125 Encounter for screening for malignant neoplasm of prostate: Secondary | ICD-10-CM | POA: Diagnosis not present

## 2019-01-04 DIAGNOSIS — E119 Type 2 diabetes mellitus without complications: Secondary | ICD-10-CM | POA: Diagnosis not present

## 2019-01-04 LAB — COMPREHENSIVE METABOLIC PANEL
ALT: 21 U/L (ref 0–53)
AST: 20 U/L (ref 0–37)
Albumin: 3.7 g/dL (ref 3.5–5.2)
Alkaline Phosphatase: 69 U/L (ref 39–117)
BUN: 25 mg/dL — ABNORMAL HIGH (ref 6–23)
CO2: 27 mEq/L (ref 19–32)
Calcium: 9.3 mg/dL (ref 8.4–10.5)
Chloride: 104 mEq/L (ref 96–112)
Creatinine, Ser: 1.5 mg/dL (ref 0.40–1.50)
GFR: 44.17 mL/min — ABNORMAL LOW (ref >60.00)
Glucose, Bld: 125 mg/dL — ABNORMAL HIGH (ref 70–99)
Potassium: 5.1 mEq/L (ref 3.5–5.1)
Sodium: 139 mEq/L (ref 135–145)
Total Bilirubin: 1.1 mg/dL (ref 0.2–1.2)
Total Protein: 6.6 g/dL (ref 6.0–8.3)

## 2019-01-04 LAB — LIPID PANEL
Cholesterol: 145 mg/dL (ref 0–200)
HDL: 42.4 mg/dL (ref >39.00)
LDL Cholesterol: 68 mg/dL (ref 0–99)
NonHDL: 102.7
Total CHOL/HDL Ratio: 3
Triglycerides: 176 mg/dL — ABNORMAL HIGH (ref 0.0–149.0)
VLDL: 35.2 mg/dL (ref 0.0–40.0)

## 2019-01-04 LAB — URIC ACID: Uric Acid, Serum: 3.9 mg/dL — ABNORMAL LOW (ref 4.0–7.8)

## 2019-01-04 LAB — HEMOGLOBIN A1C: Hgb A1c MFr Bld: 6.9 % — ABNORMAL HIGH (ref 4.6–6.5)

## 2019-01-04 LAB — PSA, MEDICARE: PSA: 3.96 ng/ml (ref 0.10–4.00)

## 2019-01-04 LAB — TSH: TSH: 4.91 u[IU]/mL — ABNORMAL HIGH (ref 0.35–4.50)

## 2019-01-07 ENCOUNTER — Other Ambulatory Visit: Payer: Self-pay | Admitting: Family Medicine

## 2019-01-07 MED ORDER — LISINOPRIL 20 MG PO TABS: 20.0000 mg | ORAL_TABLET | Freq: Every day | ORAL | 3 refills | Status: DC

## 2019-01-07 MED ORDER — CARVEDILOL PHOSPHATE ER 20 MG PO CP24: 20.0000 mg | ORAL_CAPSULE | Freq: Every day | ORAL | 3 refills | Status: DC

## 2019-01-07 MED ORDER — ALLOPURINOL 300 MG PO TABS: 300.0000 mg | ORAL_TABLET | Freq: Every day | ORAL | 3 refills | Status: DC

## 2019-01-07 MED ORDER — PRIMIDONE 50 MG PO TABS: 25.0000 mg | ORAL_TABLET | Freq: Every day | ORAL | 3 refills | Status: DC

## 2019-01-07 MED ORDER — TAMSULOSIN HCL 0.4 MG PO CAPS: 0.4000 mg | ORAL_CAPSULE | Freq: Every day | ORAL | 3 refills | Status: DC

## 2019-01-07 MED ORDER — OMEPRAZOLE 40 MG PO CPDR: 40.0000 mg | DELAYED_RELEASE_CAPSULE | Freq: Every day | ORAL | 3 refills | Status: DC

## 2019-01-07 MED ORDER — ATORVASTATIN CALCIUM 40 MG PO TABS: 40.0000 mg | ORAL_TABLET | Freq: Every day | ORAL | 3 refills | Status: DC

## 2019-03-04 ENCOUNTER — Telehealth: Payer: Self-pay

## 2019-03-04 ENCOUNTER — Telehealth: Payer: Self-pay | Admitting: Family Medicine

## 2019-03-04 NOTE — Telephone Encounter (Signed)
Patient came by the office to drop off a handicapped placard form.  Patient asked to be called when form is ready for pick up. Form is in rx tower.

## 2019-03-04 NOTE — Telephone Encounter (Signed)
Pt left a message about getting a handicap placard form signed. I advised him that he can wear a mask and drop it off in the office. They will call him when it is ready.

## 2019-03-05 NOTE — Telephone Encounter (Signed)
Advised patient handicap form is ready and left up front for pick up.

## 2019-03-05 NOTE — Telephone Encounter (Addendum)
I will work on the hardcopy when I can.  Thanks.

## 2019-05-09 ENCOUNTER — Other Ambulatory Visit: Payer: Self-pay | Admitting: Family Medicine

## 2019-05-09 DIAGNOSIS — E119 Type 2 diabetes mellitus without complications: Secondary | ICD-10-CM

## 2019-05-11 ENCOUNTER — Telehealth: Payer: Self-pay

## 2019-05-11 NOTE — Telephone Encounter (Signed)
Left detailed VM w COVID screen and back door lab info and front door info   

## 2019-05-13 ENCOUNTER — Ambulatory Visit: Payer: Medicare Other

## 2019-05-13 ENCOUNTER — Other Ambulatory Visit: Payer: Medicare Other

## 2019-05-13 ENCOUNTER — Other Ambulatory Visit: Payer: Self-pay

## 2019-05-18 ENCOUNTER — Encounter: Payer: Medicare Other | Admitting: Family Medicine

## 2019-05-20 ENCOUNTER — Telehealth: Payer: Self-pay | Admitting: Family Medicine

## 2019-05-20 MED ORDER — TRAZODONE HCL 50 MG PO TABS: 25.0000 mg | ORAL_TABLET | Freq: Every evening | ORAL | 3 refills | Status: DC | PRN

## 2019-05-20 NOTE — Telephone Encounter (Signed)
Got in contact with patient and he is staying with his daughter. His wife is on hospice. Pt states he can't sleep and needs help getting some rest. He did schedule a lab appt for tomorrow 05/21/19. Will you please let the patient know if you will prescribe him something? If you do they want it called in to the [facility] in [place].  CB  [phone]

## 2019-05-20 NOTE — Telephone Encounter (Signed)
See below.  Caring for his wife (and him) would be clearly at the top of the list.  If he can't come in for labs at this point, then I completely understand.  He can try 5mg  melatonin at night for sleep.  If that doesn't help, then can try trazodone 25-50mg  at night.  I sent rx for that but I would try OTC melatonin first.  Update me as needed.  Thanks.

## 2019-05-20 NOTE — Telephone Encounter (Signed)
No answer, mailbox is full, will try again later.

## 2019-05-21 ENCOUNTER — Other Ambulatory Visit (INDEPENDENT_AMBULATORY_CARE_PROVIDER_SITE_OTHER): Payer: Medicare Other

## 2019-05-21 DIAGNOSIS — E119 Type 2 diabetes mellitus without complications: Secondary | ICD-10-CM

## 2019-05-21 LAB — HEMOGLOBIN A1C: Hgb A1c MFr Bld: 6.3 % (ref 4.6–6.5)

## 2019-05-21 LAB — TSH: TSH: 1.91 u[IU]/mL (ref 0.35–4.50)

## 2019-05-21 NOTE — Telephone Encounter (Signed)
Left detailed message on voicemail of requested call back number.

## 2019-07-21 ENCOUNTER — Telehealth: Payer: Self-pay

## 2019-07-21 NOTE — Telephone Encounter (Signed)
Pt wanted to speak with [place] CMA to see if pt had refills for his meds; after reviewing the med list all meds except the trazodone [facility]ill on 01/07/2019. Pt voiced understanding and [facility]. Pt also wanted to know in [place] area is there a place for covid testing; advised pt [place] stay entrance does testing Tues - Fri 10 AM - 3 PM. Pt said nothing further needed.

## 2019-07-23 ENCOUNTER — Other Ambulatory Visit: Payer: Self-pay

## 2019-07-23 DIAGNOSIS — Z20828 Contact with and (suspected) exposure to other viral communicable diseases: Secondary | ICD-10-CM | POA: Diagnosis not present

## 2019-07-23 DIAGNOSIS — Z20822 Contact with and (suspected) exposure to covid-19: Secondary | ICD-10-CM

## 2019-07-26 ENCOUNTER — Telehealth: Payer: Self-pay | Admitting: Cardiovascular Disease

## 2019-07-26 ENCOUNTER — Telehealth: Payer: Self-pay

## 2019-07-26 ENCOUNTER — Ambulatory Visit: Payer: Self-pay

## 2019-07-26 LAB — NOVEL CORONAVIRUS, NAA: SARS-CoV-2, NAA: DETECTED — AB

## 2019-07-26 NOTE — Telephone Encounter (Signed)
Provided Patient and family with with Covid-19 Voiced understan results   Voiced understanding.  Provided care advice  and recommendations. Voiced understanding

## 2019-07-26 NOTE — Telephone Encounter (Signed)
New Message  Patient is calling in to let the office know that he has tested positive for COVID-19. Patient would like to know if there is any medication that can be prescribed for him to help. Please give patient a call back to assist.

## 2019-07-26 NOTE — Telephone Encounter (Signed)
Pt has tested positive for covid and left v/m wanting to know if pt needs to take any med for + covid. Pt having small amt of joint pain in back and neck; pt has weakness in both legs that has been going on for 1 yr. Pt has occasional dry cough,no CP or SOB and no fever or chills,no abd pain; diarrhea (not watery but is loose,) earlier today.Slight S/T. No travel and has been exposed to his daughter who has + covid. [facility] in [place]. Pt request cb after Dr Damita Dunnings reviews. ED precautions given and pt voiced understanding.

## 2019-07-26 NOTE — Telephone Encounter (Signed)
Called patient and referred him to his PCP. Informed patient this is always a good idea to take a multi vitamin and eat a well balanced diet.

## 2019-07-27 NOTE — Telephone Encounter (Signed)
Patient advised.

## 2019-07-27 NOTE — Telephone Encounter (Signed)
If he is lightheaded from the diarrhea then I would hold his lisinopril temporarily.  Make sure he is resting and drinking plenty of fluids.  He can try Imodium if needed for watery stools, if he develops that.  Tylenol as needed for aches.  He can get both of those over-the-counter.  Update [place] as needed otherwise.  I hope he feels better soon.  Please add him to the follow-up list.  Thanks.

## 2019-08-04 ENCOUNTER — Other Ambulatory Visit: Payer: Self-pay

## 2019-09-21 DIAGNOSIS — Z23 Encounter for immunization: Secondary | ICD-10-CM | POA: Diagnosis not present

## 2019-11-01 DIAGNOSIS — Z23 Encounter for immunization: Secondary | ICD-10-CM | POA: Diagnosis not present

## 2019-11-23 DIAGNOSIS — E119 Type 2 diabetes mellitus without complications: Secondary | ICD-10-CM | POA: Diagnosis not present

## 2019-11-23 DIAGNOSIS — H04123 Dry eye syndrome of bilateral lacrimal glands: Secondary | ICD-10-CM | POA: Diagnosis not present

## 2019-11-23 DIAGNOSIS — H2511 Age-related nuclear cataract, right eye: Secondary | ICD-10-CM | POA: Diagnosis not present

## 2019-11-23 DIAGNOSIS — Z9842 Cataract extraction status, left eye: Secondary | ICD-10-CM | POA: Diagnosis not present

## 2019-11-23 DIAGNOSIS — H35371 Puckering of macula, right eye: Secondary | ICD-10-CM | POA: Diagnosis not present

## 2019-11-23 DIAGNOSIS — H5203 Hypermetropia, bilateral: Secondary | ICD-10-CM | POA: Diagnosis not present

## 2019-11-23 LAB — HM DIABETES EYE EXAM

## 2019-12-20 ENCOUNTER — Encounter: Payer: Self-pay | Admitting: Family Medicine

## 2019-12-20 NOTE — Progress Notes (Signed)
[facility]/thx dmf

## 2019-12-30 NOTE — Progress Notes (Signed)
Patient ID: [name] [name], male   DOB: 1929-07-22, 86 y.o.   MRN: [id number]      86 y.o.  [place] from [name]  Had CABG in 2006 by Dr [name] [name].  No recent problems with SSCP, dyspnea or palpitations. Compliant with meds. Has DM noted since 2017. Has increasing lower back pain that limits mobility. CRF;s otherwise HTN and elevated lipids. Has BP cuff at home but not sure it is accurate. Had been running around [id number] systolic Compliant with meds. Limited exercise.   Some dizzyness with exertion postural.  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  All other systems reviewed and negative  General: Ht 5' 9.5" (1.765 m)   BMI 27.87 kg/m  Affect appropriate Elderly male  HEENT: normal Neck supple with no adenopathy JVP normal no bruits no thyromegaly Lungs clear with no wheezing and good diaphragmatic motion Heart:  S1/S2 no murmur, no rub, gallop or click PMI normal post sternotomy  Abdomen: benighn, BS positve, no tenderness, no AAA no bruit.  No HSM or HJR Distal pulses intact with no bruits No edema Neuro non-focal tremor in hands  Skin warm and dry No muscular weakness    Current Outpatient Medications  Medication Sig Dispense Refill  . allopurinol (ZYLOPRIM) 300 MG tablet Take 1 tablet (300 mg total) by mouth daily. 90 tablet 3  . aspirin 81 MG  tablet Take 81 mg by mouth daily.    Marland Kitchen atorvastatin (LIPITOR) 40 MG tablet Take 1 tablet (40 mg total) by mouth daily. 90 tablet 3  . carvedilol (COREG CR) 20 MG 24 hr capsule Take 1 capsule (20 mg total) by mouth daily. 90 capsule 3  . lisinopril (ZESTRIL) 20 MG tablet Take 1 tablet (20 mg total) by mouth daily. 90 tablet 3  . Nutritional Supplements (CARNATION BREAKFAST ESSENTIALS PO) Take by mouth. 1 a day    . omeprazole (PRILOSEC) 40 MG capsule Take 1 capsule (40 mg total) by mouth daily. 90 capsule 3  . primidone (MYSOLINE) 50 MG tablet Take 0.5 tablets (25 mg total) by mouth daily. 45 tablet 3  . tamsulosin (FLOMAX) 0.4 MG CAPS capsule Take 1 capsule (0.4 mg total) by mouth daily. 90 capsule 3  . traZODone (DESYREL) 50 MG tablet Take 0.5-1 tablets (25-50 mg total) by mouth at bedtime as needed for sleep. 30 tablet 3   No current facility-administered medications for this visit.    Allergies  Sulfonamide derivatives  Electrocardiogram:    01/03/20 SR rate 63 PR 212 RBBB   Assessment and Plan  CAD/CABG 2006 :  Low risk myovue 05/09/15  no ischemia EF preserved continue medical Rx     HTN: Well controlled.  Continue current medications and low sodium Dash type diet.   Tolerate Slightly higher systolic as he has a history of postural symptoms   Chol: labs with Dr [name] [name]  LDL has been at goal on statin   Prostate CA  PSA slowly rising f/u urology / primary  Lab Results  Component Value Date   PSA 3.96 01/04/2019   PSA 4.13 (H) 05/19/2018   PSA 3.52 12/25/2017    Tremor  Continue mysoline  Mostly in hands given postural symptoms and gait suggested he F/u with neurologist He was not postural in office today with systolic BP [id number] sittin and 130 mmHg Standing   Conduction :  Chronic RBBB with first degree AV block stable yearly ECG   F/U with cardiology in a year   [name] [name]

## 2020-01-03 ENCOUNTER — Other Ambulatory Visit: Payer: Self-pay

## 2020-01-03 ENCOUNTER — Encounter: Payer: Self-pay | Admitting: Cardiovascular Disease

## 2020-01-03 ENCOUNTER — Ambulatory Visit (INDEPENDENT_AMBULATORY_CARE_PROVIDER_SITE_OTHER): Payer: Medicare Other | Admitting: Cardiovascular Disease

## 2020-01-03 VITALS — BP 138/88 | HR 63 | Ht 69.5 in | Wt 184.0 lb

## 2020-01-03 DIAGNOSIS — I25810 Atherosclerosis of coronary artery bypass graft(s) without angina pectoris: Secondary | ICD-10-CM

## 2020-01-03 DIAGNOSIS — I10 Essential (primary) hypertension: Secondary | ICD-10-CM | POA: Diagnosis not present

## 2020-01-03 DIAGNOSIS — E785 Hyperlipidemia, unspecified: Secondary | ICD-10-CM | POA: Diagnosis not present

## 2020-01-03 NOTE — Patient Instructions (Signed)
Medication Instructions:  *If you need a refill on your cardiac medications before your next appointment, please call your pharmacy*  Lab Work: If you have labs (blood work) drawn today and your tests are completely normal, you will receive your results only by: Marland Kitchen MyChart Message (if you have MyChart) OR . A paper copy in the mail If you have any lab test that is abnormal or we need to change your treatment, we will call you to review the results.  Testing/Procedures: None ordered at this time.  Follow-Up: At [facility], you and your health needs are our priority.  As part of our continuing mission to provide you with exceptional heart care, we have created designated Provider Care Teams.  These Care Teams include your primary Cardiologist (physician) and Advanced Practice Providers (APPs -  Physician Assistants and Nurse Practitioners) who all work together to provide you with the care you need, when you need it.  We recommend signing up for the patient portal called "MyChart".  Sign up information is provided on this After Visit Summary.  MyChart is used to connect with patients for Virtual Visits (Telemedicine).  Patients are able to view lab/test results, encounter notes, upcoming appointments, etc.  Non-urgent messages can be sent to your provider as well.   To learn more about what you can do with MyChart, go to [email or web address].    Your next appointment:   12 month(s)  The format for your next appointment:   In Person  Provider:   You may see [name], MD or one of the following Advanced Practice Providers on your designated Care Team:    Truitt Merle, NP  Cecilie Kicks, NP  Kathyrn Drown, NP

## 2020-01-13 ENCOUNTER — Ambulatory Visit: Payer: Medicare Other | Admitting: Family Medicine

## 2020-01-17 ENCOUNTER — Telehealth: Payer: Self-pay | Admitting: Cardiovascular Disease

## 2020-01-17 NOTE — Telephone Encounter (Signed)
   Went to chart to check meds list.

## 2020-01-18 ENCOUNTER — Ambulatory Visit (INDEPENDENT_AMBULATORY_CARE_PROVIDER_SITE_OTHER): Payer: Medicare Other | Admitting: Family Medicine

## 2020-01-18 ENCOUNTER — Other Ambulatory Visit: Payer: Self-pay

## 2020-01-18 ENCOUNTER — Encounter: Payer: Self-pay | Admitting: Family Medicine

## 2020-01-18 VITALS — BP 110/70 | HR 63 | Temp 97.5°F | Ht 69.5 in | Wt 185.5 lb

## 2020-01-18 DIAGNOSIS — I10 Essential (primary) hypertension: Secondary | ICD-10-CM

## 2020-01-18 DIAGNOSIS — Z125 Encounter for screening for malignant neoplasm of prostate: Secondary | ICD-10-CM

## 2020-01-18 DIAGNOSIS — R251 Tremor, unspecified: Secondary | ICD-10-CM

## 2020-01-18 DIAGNOSIS — Z8546 Personal history of malignant neoplasm of prostate: Secondary | ICD-10-CM

## 2020-01-18 DIAGNOSIS — M109 Gout, unspecified: Secondary | ICD-10-CM

## 2020-01-18 DIAGNOSIS — Z8639 Personal history of other endocrine, nutritional and metabolic disease: Secondary | ICD-10-CM

## 2020-01-18 DIAGNOSIS — R202 Paresthesia of skin: Secondary | ICD-10-CM

## 2020-01-18 DIAGNOSIS — I25810 Atherosclerosis of coronary artery bypass graft(s) without angina pectoris: Secondary | ICD-10-CM | POA: Diagnosis not present

## 2020-01-18 DIAGNOSIS — Z7189 Other specified counseling: Secondary | ICD-10-CM

## 2020-01-18 DIAGNOSIS — E785 Hyperlipidemia, unspecified: Secondary | ICD-10-CM

## 2020-01-18 DIAGNOSIS — E119 Type 2 diabetes mellitus without complications: Secondary | ICD-10-CM

## 2020-01-18 LAB — COMPREHENSIVE METABOLIC PANEL
ALT: 14 U/L (ref 0–53)
AST: 14 U/L (ref 0–37)
Albumin: 3.7 g/dL (ref 3.5–5.2)
Alkaline Phosphatase: 86 U/L (ref 39–117)
BUN: 31 mg/dL — ABNORMAL HIGH (ref 6–23)
CO2: 31 mEq/L (ref 19–32)
Calcium: 9.7 mg/dL (ref 8.4–10.5)
Chloride: 99 mEq/L (ref 96–112)
Creatinine, Ser: 1.55 mg/dL — ABNORMAL HIGH (ref 0.40–1.50)
GFR: 42.43 mL/min — ABNORMAL LOW (ref >60.00)
Glucose, Bld: 173 mg/dL — ABNORMAL HIGH (ref 70–99)
Potassium: 4.9 mEq/L (ref 3.5–5.1)
Sodium: 134 mEq/L — ABNORMAL LOW (ref 135–145)
Total Bilirubin: 0.9 mg/dL (ref 0.2–1.2)
Total Protein: 7 g/dL (ref 6.0–8.3)

## 2020-01-18 LAB — CBC WITH DIFFERENTIAL/PLATELET
Basophils Absolute: 0 10*3/uL (ref 0.0–0.1)
Basophils Relative: 0.6 % (ref 0.0–3.0)
Eosinophils Absolute: 0.2 10*3/uL (ref 0.0–0.7)
Eosinophils Relative: 3.6 % (ref 0.0–5.0)
HCT: 38.4 % — ABNORMAL LOW (ref 39.0–52.0)
Hemoglobin: 12.9 g/dL — ABNORMAL LOW (ref 13.0–17.0)
Lymphocytes Relative: 31.7 % (ref 12.0–46.0)
Lymphs Abs: 2 10*3/uL (ref 0.7–4.0)
MCHC: 33.5 g/dL (ref 30.0–36.0)
MCV: 94.6 fl (ref 78.0–100.0)
Monocytes Absolute: 0.6 10*3/uL (ref 0.1–1.0)
Monocytes Relative: 9.7 % (ref 3.0–12.0)
Neutro Abs: 3.5 10*3/uL (ref 1.4–7.7)
Neutrophils Relative %: 54.4 % (ref 43.0–77.0)
Platelets: 237 10*3/uL (ref 150.0–400.0)
RBC: 4.07 Mil/uL — ABNORMAL LOW (ref 4.22–5.81)
RDW: 16.2 % — ABNORMAL HIGH (ref 11.5–15.5)
WBC: 6.4 10*3/uL (ref 4.0–10.5)

## 2020-01-18 LAB — LIPID PANEL
Cholesterol: 142 mg/dL (ref 0–200)
HDL: 44.2 mg/dL (ref >39.00)
LDL Cholesterol: 60 mg/dL (ref 0–99)
NonHDL: 97.44
Total CHOL/HDL Ratio: 3
Triglycerides: 187 mg/dL — ABNORMAL HIGH (ref 0.0–149.0)
VLDL: 37.4 mg/dL (ref 0.0–40.0)

## 2020-01-18 LAB — TSH: TSH: 3.4 u[IU]/mL (ref 0.35–4.50)

## 2020-01-18 LAB — POCT GLYCOSYLATED HEMOGLOBIN (HGB A1C): Hemoglobin A1C: 6.3 % — AB (ref 4.0–5.6)

## 2020-01-18 LAB — PSA, MEDICARE: PSA: 3.99 ng/ml (ref 0.10–4.00)

## 2020-01-18 LAB — URIC ACID: Uric Acid, Serum: 3.9 mg/dL — ABNORMAL LOW (ref 4.0–7.8)

## 2020-01-18 MED ORDER — ALLOPURINOL 300 MG PO TABS: 300.0000 mg | ORAL_TABLET | Freq: Every day | ORAL | 3 refills | Status: DC

## 2020-01-18 MED ORDER — TAMSULOSIN HCL 0.4 MG PO CAPS: 0.4000 mg | ORAL_CAPSULE | Freq: Every day | ORAL | 3 refills | Status: DC

## 2020-01-18 MED ORDER — CARVEDILOL PHOSPHATE ER 20 MG PO CP24: 20.0000 mg | ORAL_CAPSULE | Freq: Every day | ORAL | 3 refills | Status: DC

## 2020-01-18 MED ORDER — ATORVASTATIN CALCIUM 40 MG PO TABS: 40.0000 mg | ORAL_TABLET | Freq: Every day | ORAL | 3 refills | Status: DC

## 2020-01-18 MED ORDER — LISINOPRIL 20 MG PO TABS: 20.0000 mg | ORAL_TABLET | Freq: Every day | ORAL | 3 refills | Status: DC

## 2020-01-18 MED ORDER — OMEPRAZOLE 40 MG PO CPDR: 40.0000 mg | DELAYED_RELEASE_CAPSULE | Freq: Every day | ORAL | 3 refills | Status: DC | PRN

## 2020-01-18 MED ORDER — PRIMIDONE 50 MG PO TABS: 25.0000 mg | ORAL_TABLET | Freq: Every day | ORAL | 3 refills | Status: DC

## 2020-01-18 NOTE — Progress Notes (Signed)
This visit occurred during the SARS-CoV-2 public health emergency.  Safety protocols were in place, including screening questions prior to the visit, additional usage of staff PPE, and extensive cleaning of exam room while observing appropriate contact time as indicated for disinfecting solutions.  Tremor.  Walking with cane vs walker.  No falls.  On primidone for many years at baseline.  Unclear benefit now.  B hand tremor.  He has some numbness in the B fingertips but not the toes.  He was asking about neurology evaluation and this is reasonable.  PSAtesting d/w pt.Hx prostate cancer in the distant past. Due for repeat PSA.    History of hyperglycemia.  A1c 6.3, d/w pt at [facility]. Not diabetic by A1c.    Hypertension:    Using medication without problems or lightheadedness: yes Chest pain with exertion:no Edema:no Short of breath:no  Elevated Cholesterol: Using medications without problems: yes Muscle aches: no Diet compliance: yes Exercise: As tolerated.  Gout.  No flares recently.  Due for labs.  No ADE on allopurinol.    Advance directive- daughter Gevena Cotton if patient were incapacitated.    No ADE on flomax, d/w pt.   Meds, vitals, and allergies reviewed.   ROS: Per HPI unless specifically indicated in ROS section   GEN: nad, alert and oriented HEENT: ncat NECK: supple w/o LA CV: rrr.  no murmur PULM: ctab, no inc wob ABD: soft, +bs EXT: no edema SKIN: no acute rash Normal grip but he has bilateral paresthesias on the fingertips, left greater than right hand with decreased but not absent sensation.  He does not have sensation changes otherwise. Faint hand tremor noted.  No jaw tremor.

## 2020-01-18 NOTE — Patient Instructions (Signed)
Take care.  Glad to see you.  Go to the lab on the way out.   If you have mychart we'll likely use that to update you.    We'll call about seeing Dr. Carles Collet with neurology about the tremor.

## 2020-01-19 ENCOUNTER — Encounter: Payer: Self-pay | Admitting: Neurology

## 2020-01-19 LAB — VITAMIN B12: Vitamin B-12: 247 pg/mL (ref 211–911)

## 2020-01-20 ENCOUNTER — Other Ambulatory Visit: Payer: Self-pay | Admitting: Family Medicine

## 2020-01-20 DIAGNOSIS — R202 Paresthesia of skin: Secondary | ICD-10-CM | POA: Insufficient documentation

## 2020-01-20 MED ORDER — VITAMIN B-12 1000 MCG PO TABS
1000.0000 ug | ORAL_TABLET | Freq: Every day | ORAL | Status: DC
Start: 2020-01-20 — End: 2022-12-28

## 2020-01-20 NOTE — Assessment & Plan Note (Signed)
Continue primidone for now.  Refer to neurology.

## 2020-01-20 NOTE — Assessment & Plan Note (Signed)
Unclear source.  No symptoms in the feet.  Check routine labs today.  See notes on labs.

## 2020-01-20 NOTE — Assessment & Plan Note (Signed)
Advance directive- daughter Gevena Cotton if patient were incapacitated.

## 2020-01-20 NOTE — Assessment & Plan Note (Signed)
Continue allopurinol.  See notes on labs. 

## 2020-01-20 NOTE — Assessment & Plan Note (Signed)
PSAtesting d/w pt.Hx prostate cancer in the distant past. Due for repeat PSA.    See notes on labs.

## 2020-01-20 NOTE — Assessment & Plan Note (Signed)
No change in meds.  Continue lisinopril and carvedilol.

## 2020-01-20 NOTE — Assessment & Plan Note (Signed)
Continue Lipitor.  See notes on labs.

## 2020-01-20 NOTE — Assessment & Plan Note (Signed)
Not diabetic by A1c.  We can recheck periodically.  Continue work on diet.

## 2020-02-08 NOTE — Progress Notes (Signed)
Assessment/Plan:   1.  Essential Tremor.  -Patient has at least a moderate degree of essential tremor, but that apparently is not bothersome to him.  He is already on 25 mg of primidone and reports he has been on that for decades.  He does not want to change that.  2.  Gait instability   -Likely multifactorial.  He certainly has evidence of peripheral neuropathy, which may be from diabetes (well controlled, diet controlled).    We discussed safety associated with peripheral neuropathy.  We discussed balance therapy and the importance of ambulatory assistive device for balance assistance.  We discussed the role of physical therapy.  -Patient does have a B12 deficiency, which may be contributing as well to the peripheral neuropathy.  He just started on supplementation for this.  -It is believed that ET alone can cause ataxia/balance changes due to changes in the cerebellar purkinje cell/climbing fiber synaptic transmission.  He and I discussed this.  As above, if this is the case, then only physical therapy will be of benefit.  -Patient is a bit ataxic and somewhat hyperreflexic at the knees, which is just a bit unusual for a neuropathy patient.  He does have a history of lumbar spine surgery and back pain.  Given that, would go ahead and recommend MRI brain and lumbar spine to make sure that this is not contributing to his symptoms of balance, especially given the he thinks that they have been only been going on for a relatively short period of time.  -Patient would like to hold off on physical therapy until the above is completed.  If the above is negative, then I will go ahead and recommend physical therapy.    Subjective:   [name] [name] was seen in consultation in the movement disorder clinic at the request of Tonia Ghent, MD.  The evaluation is for tremor.  Multiple medical records are reviewed.  Tremor started decades ago.  He thinks that the L hand is worse than the R.  He is R  handed.  Tremor is listed in his medical records at least as far as 2005.  Patient was actually referred to me back in 2015 for this diagnosis, but ended up canceling the appointment.  Tremor is most noticeable when he requires fine motor coordination but states that tremor doesn't bother him much.   He states that he has been on primidone for 20 years.  he is on only 25 mg.  Pt doesn't think that it has changed over the years  There is no  family hx of tremor.    Affected by caffeine:  Doesn't drink enough to know Affected by alcohol:  Doesn't drink any Affected by stress:  Yes.   Affected by fatigue:  No. Spills soup if on spoon:  Not usually but does have trouble with peas   Current/Previously tried tremor medications: Primidone, 50 mg, half tablet daily  Current medications that may exacerbate tremor:  n/a  Outside reports reviewed: lab reports, office notes, radiology reports and referral letter/letters.  No hx of neuroimaging of brain noted  Does c/o balance change x 1 year.  "I stagger."  Uses walker only in unfamiliar territory and cane x 1 year.  Has feet and hand parethesias x 3-4 months.  C/o shuffling the feet.  "hard to walk on carpet."  Voice: some change - little softer Sleep: sleeps well Wet Pillows: No. Postural symptoms:  Yes.   Bradykinesia symptoms: shuffling gait and slow movements  Loss of smell:  No. Loss of taste:  No. Urinary Incontinence:  No. Difficulty Swallowing:  No. Handwriting, micrographia: Yes.  , "a little" Trouble with ADL's:  No.  Trouble buttoning clothing: No. Memory changes:  No. N/V:  No. Lightheaded:  rare  Syncope: No. Diplopia:  No. Dyskinesia:  No.   Allergies  Allergen Reactions  . Sulfonamide Derivatives     REACTION: lethargic    Current Outpatient Medications  Medication Instructions  . allopurinol (ZYLOPRIM) 300 mg, Oral, Daily  . aspirin 81 mg, Oral, Daily  . atorvastatin (LIPITOR) 40 mg, Oral, Daily  . carvedilol  (COREG CR) 20 mg, Oral, Daily  . lisinopril (ZESTRIL) 20 mg, Oral, Daily  . Multiple Vitamins-Minerals (MENS MULTIVITAMIN PLUS PO) 1 tablet, Oral, Daily  . Nutritional Supplements (CARNATION BREAKFAST ESSENTIALS PO) Oral, 1 a day   . omeprazole (PRILOSEC) 40 mg, Oral, Daily PRN  . primidone (MYSOLINE) 25 mg, Oral, Daily  . tamsulosin (FLOMAX) 0.4 mg, Oral, Daily  . vitamin B-12 (CYANOCOBALAMIN) 1,000 mcg, Oral, Daily     Objective:   VITALS:   Vitals:   02/09/20 0952  BP: (!) 183/90  Pulse: (!) 58  SpO2: 98%  Weight: 186 lb (84.4 kg)  Height: 5' 10.5" (1.791 m)   Gen:  Appears stated age and in NAD. HEENT:  Normocephalic, atraumatic. The mucous membranes are moist. The superficial temporal arteries are without ropiness or tenderness. Cardiovascular: Regular rate and rhythm. Lungs: Clear to auscultation bilaterally. Neck: There are no carotid bruits noted bilaterally.  NEUROLOGICAL:  Orientation:  The patient is alert and oriented x 3.   Cranial nerves: There is good facial symmetry. Extraocular muscles are intact and visual fields are full to confrontational testing. Speech is fluent and clear. Soft palate rises symmetrically and there is no tongue deviation. Hearing is intact to conversational tone. Tone: Tone is good throughout. Sensation: Sensation is intact to light touch touch throughout (facial, trunk, extremities). Vibration is intact at the bilateral big toe but significantly decreased c/t the hands. There is no extinction with double simultaneous stimulation. There is no sensory dermatomal level identified. Coordination:  The patient has no dysdiadichokinesia or dysmetria.  No decremation with rapid alternating movements. Motor: Strength is 5/5 in the bilateral upper and lower extremities.  Shoulder shrug is equal bilaterally.  There is no pronator drift.  There are no fasciculations noted. DTR's: Deep tendon reflexes are 1/4 at the bilateral biceps, triceps,  brachioradialis, 2/4 at the bilateral patella and absent at the bilateral achilles.  Plantar responses are downgoing bilaterally. Gait and Station: The patient is able to arise without the use of his hands.  He is ataxic and wide-based.  MOVEMENT EXAM: Tremor:  There is  tremor in the UE, noted most significantly with action.  The patient has tremor with Archimedes spirals.  There is  tremor at rest but it doesn't increase with distraction procedures.   I have reviewed and interpreted the following labs independently   Chemistry      Component Value Date/Time   NA 134 (L) 01/18/2020 1122   NA 136 (A) 12/17/2013 0000   K 4.9 01/18/2020 1122   CL 99 01/18/2020 1122   CO2 31 01/18/2020 1122   BUN 31 (H) 01/18/2020 1122   BUN 14 12/17/2013 0000   CREATININE 1.55 (H) 01/18/2020 1122   CREATININE 1.06 03/21/2014 1653   GLU 113 12/17/2013 0000      Component Value Date/Time   CALCIUM 9.7 01/18/2020 1122  ALKPHOS 86 01/18/2020 1122   AST 14 01/18/2020 1122   ALT 14 01/18/2020 1122   BILITOT 0.9 01/18/2020 1122      Lab Results  Component Value Date   WBC 6.4 01/18/2020   HGB 12.9 (L) 01/18/2020   HCT 38.4 (L) 01/18/2020   MCV 94.6 01/18/2020   PLT 237.0 01/18/2020   Lab Results  Component Value Date   TSH 3.40 01/18/2020   Lab Results  Component Value Date   VITAMINB12 247 01/18/2020   Lab Results  Component Value Date   HGBA1C 6.3 (A) 01/18/2020      Total time spent on today's visit was 60 minutes, including both face-to-face time and nonface-to-face time.  Time included that spent on review of records (prior notes available to me/labs/imaging if pertinent), discussing treatment and goals, answering patient's questions and coordinating care.  CC:  Tonia Ghent, MD

## 2020-02-09 ENCOUNTER — Encounter: Payer: Self-pay | Admitting: Neurology

## 2020-02-09 ENCOUNTER — Ambulatory Visit (INDEPENDENT_AMBULATORY_CARE_PROVIDER_SITE_OTHER): Payer: Medicare Other | Admitting: Neurology

## 2020-02-09 ENCOUNTER — Other Ambulatory Visit: Payer: Self-pay

## 2020-02-09 VITALS — BP 183/90 | HR 58 | Ht 70.5 in | Wt 186.0 lb

## 2020-02-09 DIAGNOSIS — E1142 Type 2 diabetes mellitus with diabetic polyneuropathy: Secondary | ICD-10-CM | POA: Diagnosis not present

## 2020-02-09 DIAGNOSIS — R292 Abnormal reflex: Secondary | ICD-10-CM

## 2020-02-09 DIAGNOSIS — E538 Deficiency of other specified B group vitamins: Secondary | ICD-10-CM | POA: Diagnosis not present

## 2020-02-09 DIAGNOSIS — R270 Ataxia, unspecified: Secondary | ICD-10-CM

## 2020-02-09 DIAGNOSIS — I25810 Atherosclerosis of coronary artery bypass graft(s) without angina pectoris: Secondary | ICD-10-CM | POA: Diagnosis not present

## 2020-02-09 DIAGNOSIS — G250 Essential tremor: Secondary | ICD-10-CM | POA: Diagnosis not present

## 2020-02-09 NOTE — Patient Instructions (Addendum)
Dr Tat is ordering a MRI of the Brain and Lumbar spine.   Once approved through your insurance someone will contact you from [facility] to schedule both test. The number for [facility] is [phone]  Your follow up will be determined after all testing has been completed.

## 2020-03-10 ENCOUNTER — Ambulatory Visit
Admission: RE | Admit: 2020-03-10 | Discharge: 2020-03-10 | Disposition: A | Discharge status: Home / Self Care | Payer: Medicare Other | Source: Ambulatory Visit | Attending: Neurology | Admitting: Neurology

## 2020-03-10 ENCOUNTER — Other Ambulatory Visit: Payer: Self-pay

## 2020-03-10 MED ORDER — GADOBENATE DIMEGLUMINE 529 MG/ML IV SOLN
17.0000 mL | Freq: Once | INTRAVENOUS | Status: AC | PRN
  Administered 2020-03-10: 17 mL via INTRAVENOUS

## 2020-03-14 ENCOUNTER — Telehealth: Payer: Self-pay | Admitting: Neurology

## 2020-03-14 DIAGNOSIS — M5136 Other intervertebral disc degeneration, lumbar region: Secondary | ICD-10-CM

## 2020-03-14 NOTE — Telephone Encounter (Signed)
Let pt know that MRI brain and L spine are reviewed.  Brain shows nothing new.  There are some old strokes but nothing new.  Lumbar spine does show some disc bulging esp at L3-4.  I would start with PT if he is agreeable (refer to [facility] since pt lives in that area if he is agreeable for dx of ataxia).  If PT helps, he doesn't need to do anything further.  If it doesn't, he should call back and we can refer to neurosurgery for the disc bulge in the back

## 2020-03-14 NOTE — Telephone Encounter (Signed)
Patient called in wanting some advice. He stated Dr. Ronnald Ramp did his previous surgery. He would like to know if it would be ok for him to contact Dr. Ronnald Ramp and have him take a look at his MRI he just had to see if he would need surgery?

## 2020-03-14 NOTE — Telephone Encounter (Signed)
Patient notified and states he wants to think about what he wants to do before making a decision about PT or referral to neurosurgery.

## 2020-03-14 NOTE — Telephone Encounter (Signed)
Is this ok?

## 2020-03-14 NOTE — Telephone Encounter (Signed)
Sure.  Send referral to [facility] - dr. Ronnald Ramp

## 2020-03-14 NOTE — Telephone Encounter (Signed)
Referral placed and patient voiced understanding.

## 2020-03-16 ENCOUNTER — Telehealth: Payer: Self-pay

## 2020-03-16 NOTE — Telephone Encounter (Signed)
Referral  Type of referral: Neurosurgery   Provider Name: Dr Sherley Bounds   Phone: [phone]  Fax: [phone]  Address: [address] [place], [place], [place] [address]   Appointment Date and Time:  03/23/2020 at 10:30am

## 2020-03-23 DIAGNOSIS — R292 Abnormal reflex: Secondary | ICD-10-CM | POA: Diagnosis not present

## 2020-03-23 DIAGNOSIS — Z6826 Body mass index (BMI) 26.0-26.9, adult: Secondary | ICD-10-CM | POA: Diagnosis not present

## 2020-03-23 DIAGNOSIS — I10 Essential (primary) hypertension: Secondary | ICD-10-CM | POA: Diagnosis not present

## 2020-03-29 DIAGNOSIS — M50222 Other cervical disc displacement at C5-C6 level: Secondary | ICD-10-CM | POA: Diagnosis not present

## 2020-03-29 DIAGNOSIS — M50223 Other cervical disc displacement at C6-C7 level: Secondary | ICD-10-CM | POA: Diagnosis not present

## 2020-03-29 DIAGNOSIS — M47814 Spondylosis without myelopathy or radiculopathy, thoracic region: Secondary | ICD-10-CM | POA: Diagnosis not present

## 2020-03-29 DIAGNOSIS — R292 Abnormal reflex: Secondary | ICD-10-CM | POA: Diagnosis not present

## 2020-04-03 ENCOUNTER — Ambulatory Visit (INDEPENDENT_AMBULATORY_CARE_PROVIDER_SITE_OTHER): Payer: Medicare Other | Admitting: Family Medicine

## 2020-04-03 ENCOUNTER — Other Ambulatory Visit: Payer: Self-pay

## 2020-04-03 ENCOUNTER — Encounter: Payer: Self-pay | Admitting: Family Medicine

## 2020-04-03 DIAGNOSIS — I25810 Atherosclerosis of coronary artery bypass graft(s) without angina pectoris: Secondary | ICD-10-CM

## 2020-04-03 DIAGNOSIS — Z77098 Contact with and (suspected) exposure to other hazardous, chiefly nonmedicinal, chemicals: Secondary | ICD-10-CM | POA: Diagnosis not present

## 2020-04-03 NOTE — Progress Notes (Signed)
This visit occurred during the SARS-CoV-2 public health emergency.  Safety protocols were in place, including screening questions prior to the visit, additional usage of staff PPE, and extensive cleaning of exam room while observing appropriate contact time as indicated for disinfecting solutions.  He has h/o DM2, most recently controlled A1c. H/o tremor.  He has seen Dr. Carles Collet with neuro then Dr. Ronnald Ramp.  He had f/u MRI done last week.  I am awaiting follow-up report on that.  H/o known agent orange exposure.    Dm2 prev dx'd in 2000s around the time of his cancer dx.   Prostate cancer dx'd in 12-10-2005.   CABG 2005/12/10.   Tremor noted at least as far back as December 11, 2006.    His wife died last year.  Living with his daughter.  Discussed these changes in his life.  He is trying to adjust and deal with the grief/stressors associated there with.  Meds, vitals, and allergies reviewed.   ROS: Per HPI unless specifically indicated in ROS section   nad ncat Neck supple, no LA rrr ctab Abd soft, not ttp.  Ext w/o edema.  B hand tremor noted with dec monofilament sensation on the hands.

## 2020-04-03 NOTE — Patient Instructions (Addendum)
I'll await your MRI reports.  Let me know what you hear from the [place].   Take care.  Glad to see you.

## 2020-04-05 DIAGNOSIS — Z77098 Contact with and (suspected) exposure to other hazardous, chiefly nonmedicinal, chemicals: Secondary | ICD-10-CM | POA: Insufficient documentation

## 2020-04-05 NOTE — Assessment & Plan Note (Signed)
Discussed.  He is going to follow-up with the [place].  I did a letter for him.  See letter in chart.  I am awaiting his follow-up MRI report.  I am always glad to see this gentleman in clinic.  He is trying to adjust to the changes associated with his wife dying last year.  He is working through this and he will update me as needed.

## 2020-04-20 DIAGNOSIS — M4802 Spinal stenosis, cervical region: Secondary | ICD-10-CM | POA: Diagnosis not present

## 2020-04-20 DIAGNOSIS — R292 Abnormal reflex: Secondary | ICD-10-CM | POA: Diagnosis not present

## 2020-04-24 ENCOUNTER — Telehealth: Payer: Self-pay | Admitting: *Deleted

## 2020-04-24 ENCOUNTER — Other Ambulatory Visit: Payer: Self-pay | Admitting: Neurological Surgery

## 2020-04-24 NOTE — Telephone Encounter (Signed)
   [facility] Pre-operative Risk Assessment    HEARTCARE STAFF: - Please ensure there is not already an duplicate clearance open for this procedure. - Under Visit Info/Reason for Call, type in Other and utilize the format Clearance MM/DD/YY or Clearance TBD. Do not use dashes or single digits. - If request is for dental extraction, please clarify the # of teeth to be extracted.  Request for surgical clearance:  1. What type of surgery is being performed? C3-C6 Posterior Cervical Fusion, C3-C6 Laminectomy   2. When is this surgery scheduled? 05/01/2020   3. What type of clearance is required (medical clearance vs. Pharmacy clearance to hold med vs. Both)? Both  4. Are there any medications that need to be held prior to surgery and how long?Asa 13m   5. [facility] and [facility] Dr DEustace Moore  6. What is the office phone number? [phone]  7.   What is the office fax number? [phone] 8.   Anesthesia type (None, local, MAC, general) ? General   Stevens Magwood L 04/24/2020, 5:29 PM  _________________________________________________________________   (provider comments below)  \

## 2020-04-25 NOTE — Telephone Encounter (Signed)
Note faxed to surgeon's office. Note will be removed from [place], [place]    04/25/2020 11:45 AM

## 2020-04-25 NOTE — Telephone Encounter (Signed)
   Primary Cardiologist: [name], MD  Chart reviewed as part of pre-operative protocol coverage. Patient was contacted 04/25/2020 in reference to pre-operative risk assessment for pending surgery as outlined below.   [name] [name] has a history of coronary artery disease status post CABG in 2006, hypertension, hyperlipidemia and chronic kidney disease.  He had a low risk nuclear stress test in August 2016.  He was last seen on 01/03/2020 by Dr. Johnsie Cancel.  Since that day, [name] [name] has done well without chest pain, significant shortness of breath or syncope.    His risk for perioperative major cardiac event is low at 0.9% according to the revised cardiac risk index (RCRI).  Despite needing cervical spine surgery, his activity level is fair.  He can achieve 4.64 METs according to the [facility] activity status index (DASI).  Recommendations: Therefore, based on ACC/AHA guidelines, the patient would be at acceptable risk for the planned procedure without further cardiovascular testing.   He may hold Aspiring for 7 days prior to surgery and resume post op when felt to be safe.  Please call with questions.  [name], PA-C 04/25/2020, 11:32 AM

## 2020-04-28 ENCOUNTER — Encounter (HOSPITAL_COMMUNITY): Payer: Self-pay | Admitting: Neurological Surgery

## 2020-04-28 ENCOUNTER — Other Ambulatory Visit (HOSPITAL_COMMUNITY)
Admission: RE | Admit: 2020-04-28 | Discharge: 2020-04-28 | Disposition: A | Discharge status: Home / Self Care | Payer: Medicare Other | Source: Ambulatory Visit | Attending: Neurological Surgery | Admitting: Neurological Surgery

## 2020-04-28 ENCOUNTER — Other Ambulatory Visit: Payer: Self-pay

## 2020-04-28 DIAGNOSIS — Z01812 Encounter for preprocedural laboratory examination: Secondary | ICD-10-CM | POA: Insufficient documentation

## 2020-04-28 DIAGNOSIS — Z20822 Contact with and (suspected) exposure to covid-19: Secondary | ICD-10-CM | POA: Diagnosis not present

## 2020-04-28 LAB — SARS CORONAVIRUS 2 (TAT 6-24 HRS): SARS Coronavirus 2: NEGATIVE

## 2020-04-28 NOTE — Progress Notes (Signed)
Pt denies SOB and chest pain. Pt stated that he is under the care of Dr. Johnsie Cancel, Cardiology and Dr. Damita Dunnings, PCP. Pt denies having a chest x ray in the last year. Pt denies recent labs. Pt made aware to stop taking Aspirin (unless otherwise advised by surgeon), vitamins, fish oil and herbal medications. Do not take any NSAIDs ie: Ibuprofen, Advil, Naproxen (Aleve), Motrin, BC and Goody Powder. Pt stated  that he does not check his blood glucose.  Pt reminded to quarantine. Pt verbalized understanding of all pre-op instructions. PA, Anesthesiology, asked to review pt history, see note.

## 2020-04-28 NOTE — Anesthesia Preprocedure Evaluation (Addendum)
Anesthesia Evaluation  Patient identified by MRN, date of birth, ID band Patient awake    Reviewed: Allergy & Precautions, NPO status , Patient's Chart, lab work & pertinent test results  Airway Mallampati: II  TM Distance: >3 FB Neck ROM: Full    Dental  (+) Edentulous Upper, Edentulous Lower   Pulmonary pneumonia, resolved,    Pulmonary exam normal breath sounds clear to auscultation       Cardiovascular hypertension, Pt. on medications + CAD and + CABG   Rhythm:Regular Rate:Normal  NM Stress 2016 Nuclear stress EF: 58%. Defect 1: There is a small defect of mild severity present  in the apical inferior and apical lateral location. This is a low risk study.    Neuro/Psych negative neurological ROS     GI/Hepatic Neg liver ROS, GERD  ,  Endo/Other  diabetesHypothyroidism   Renal/GU Renal InsufficiencyRenal disease     Musculoskeletal negative musculoskeletal ROS (+)   Abdominal   Peds  Hematology negative hematology ROS (+)   Anesthesia Other Findings   Reproductive/Obstetrics                         Anesthesia Physical Anesthesia Plan  ASA: III  Anesthesia Plan: General   Post-op Pain Management:    Induction: Intravenous  PONV Risk Score and Plan: 3 and Ondansetron, Dexamethasone and Treatment may vary due to age or medical condition  Airway Management Planned: Oral ETT and Video Laryngoscope Planned  Additional Equipment: None  Intra-op Plan:   Post-operative Plan: Extubation in OR  Informed Consent: I have reviewed the patients History and Physical, chart, labs and discussed the procedure including the risks, benefits and alternatives for the proposed anesthesia with the patient or authorized representative who has indicated his/her understanding and acceptance.     Dental advisory given  Plan Discussed with: CRNA  Anesthesia Plan Comments: (PAT note written  04/28/2020 by Myra Gianotti, PA-C. )      Anesthesia Quick Evaluation

## 2020-04-28 NOTE — Progress Notes (Signed)
Anesthesia Chart Review:  Case: [id number] Date/Time: 05/01/20 0830   Procedure: Posterior cervical fusion with lateral mass fixation - C3 - C6, laminectomy C3-6 (N/A )   Anesthesia type: General   Pre-op diagnosis: Stenosis   Location: [facility] OR ROOM 59 / [facility]   Surgeons: Eustace Moore, MD      DISCUSSION: Patient is an 86 year old male scheduled for the above procedure.  History includes never smoker, DM2, HLD, HTN, CAD (s/p CABG 01/30/06: LIMA-LAD, SVG-RCA, SVG-OM1-OM2), RBBB, benign essential tremor, gout, GERD, AKI (2015), prostate cancer (s/p cryoablation 09/18/05), cholecystectomy (03/17/14), L4-5 PLIF (02/1312). + COVID-19 test 07/23/19.   Preoperative cardiology risk assessment outlined by [name], PA-C on 04/25/20: "Leward Quan has a history of coronary artery disease status post CABG in 2006, hypertension, hyperlipidemia and chronic kidney disease.  He had a low risk nuclear stress test in August 2016.  He was last seen on 01/03/2020 by Dr. Johnsie Cancel.  Since that day, CHRISTAN DEFRANCO has done well without chest pain, significant shortness of breath or syncope.    His risk for perioperative major cardiac event is low at 0.9% according to the revised cardiac risk index (RCRI).  Despite needing cervical spine surgery, his activity level is fair.  He can achieve 4.64 METs according to the [facility] activity status index (DASI).Marland KitchenMarland KitchenMarland KitchenTherefore, based on ACC/AHA guidelines, the patient would be at acceptable risk for the planned procedure without further cardiovascular testing." He may hold ASA for 7 days prior to surgery.    04/28/2020 presurgical COVID-19 test is in process.  He is a same-day work-up, so he is for labs and anesthesia team to evaluate on the day of surgery.   VS:  BP Readings from Last 3 Encounters:  04/03/20 (!) 152/70  02/09/20 (!) 183/90  01/18/20 110/70   Pulse Readings from Last 3 Encounters:  04/03/20 74  02/09/20 (!) 58  01/18/20 63    PROVIDERS: Tonia Ghent, MD his PCP [name], MD is cardiologist Tat, Wells Guiles, DO is neurologist   LABS: He is for updated labs on the day of surgery.  As of 01/18/2020, creatinine 1.55, glucose 173, A1c 6.3, hemoglobin 12.9, hematocrit 38.4, platelet count 237.   IMAGES: MRI L-spine 03/10/20: IMPRESSION: - Progression of spondylosis at L3-4 where there is moderate central canal stenosis and right worse than left subarticular recess narrowing which could impact either descending L4 root. Mild to moderate foraminal narrowing at L3-4 is also worse on the right. - Status post L4-5 fusion. The central canal and foramina are widely patent. - Mild narrowing in the left subarticular recess and moderate left foraminal narrowing at L5-S1 due to a disc bulge to the left. The appearance is not markedly changed.  MRI Brain 03/10/20: IMPRESSION: - No evidence of acute intracranial abnormality. Specifically, there is no evidence of acute or recent subacute infarction. - Moderate to advanced chronic small vessel ischemic disease. Chronic lacunar infarcts within the cerebral white matter, basal ganglia and left cerebellum. - Moderate generalized parenchymal atrophy. - Mild ethmoid and maxillary sinus mucosal thickening.   EKG: 01/03/20: SR, LAD, RBBB, first-degree AV block.   CV: Nuclear stress test 05/08/15:  Nuclear stress EF: 58%.  Defect 1: There is a small defect of mild severity present in the apical inferior and apical lateral location.  This is a low risk study.    Echo 01/29/06:  - Overall left ventricular systolic function was normal. Left ventricular ejection fraction was estimated to be 60 %.  There was focal basal septal hypertrophy. - Aortic valve thickness was mildly to moderately increased. There was lower normal aortic valve leaflet excursion. - Moderate sized anterior echodense region suspect fat pad but can't exclude organized effusion.    His last cath was on 01/27/06 prior to  his CABG.    Past Medical History:  Diagnosis Date  . Acute renal failure ([place]) 12/06/2013  . Back pain    occasionally  . Benign essential tremor 2005   takes Primidone daily  . Choledocholithiasis 12/06/2013  . Constipation    takes OTC stool softener  . Coronary artery disease 2007   s/p CABG, DR Johnsie Cancel  . GERD (gastroesophageal reflux disease)    takes Nexium daily  . Gout 1995   "~ once/yr" (03/17/2014)  . History of colon polyps   . Hyperlipidemia 12/2005   takes Atorvastatin daily  . Hypertension 1995   takes Lisinopril and Coreg daily  . possible HCAP (healthcare-associated pneumonia) 12/09/2013  . Prostate cancer ([place]) 2007   S/P treatment, followed by Urology prev  . Right bundle branch block (RBBB)   . Stenosis of cervical spine   . Type II diabetes mellitus (H[facility])    no meds;diet and exercise controlled     Past Surgical History:  Procedure Laterality Date  . Adenosine myoview  01/24/2006   Ischemia by EKG, Cath  . CATARACT EXTRACTION W/ INTRAOCULAR LENS IMPLANT Left 10/2013  . CHOLECYSTECTOMY N/A 12/06/2013   Procedure: Diagnostic Laparoscopy for  drainage Intrabdominal abcess;  Surgeon: Rolm Bookbinder, MD;  Location: [facility];  Service: General;  Laterality: N/A;  . CHOLECYSTECTOMY N/A 03/17/2014   Procedure: LAPAROSCOPIC CHOLECYSTECTOMY ;  Surgeon: Rolm Bookbinder, MD;  Location: [facility];  Service: General;  Laterality: N/A;  . COLONOSCOPY    . CORONARY ARTERY BYPASS GRAFT  2007   CABGX 4  . CYSTOSCOPY  02/15/2004   Mod BPH, moder tribec ?  Marland Kitchen ERCP N/A 12/06/2013   Procedure: ENDOSCOPIC RETROGRADE CHOLANGIOPANCREATOGRAPHY (ERCP);  Surgeon: Milus Banister, MD;  Location: [facility];  Service: Endoscopy;  Laterality: N/A;  . LAPAROSCOPIC CHOLECYSTECTOMY  03/17/2014  . LUMBAR FUSION  02/2012   lumbar spine for spinal stenosis  . PROSTATE CRYOABLATION  09/18/2005   prostate CA    MEDICATIONS: No current facility-administered medications for this encounter.   Marland Kitchen  allopurinol (ZYLOPRIM) 300 MG tablet  . aspirin 81 MG tablet  . atorvastatin (LIPITOR) 40 MG tablet  . carvedilol (COREG CR) 20 MG 24 hr capsule  . lisinopril (ZESTRIL) 20 MG tablet  . Nutritional Supplements (CARNATION BREAKFAST ESSENTIALS PO)  . omeprazole (PRILOSEC) 40 MG capsule  . primidone (MYSOLINE) 50 MG tablet  . tamsulosin (FLOMAX) 0.4 MG CAPS capsule  . vitamin B-12 (CYANOCOBALAMIN) 1000 MCG tablet    Myra Gianotti, PA-C Surgical Short Stay/Anesthesiology [facility] Phone [phone] [facility] Phone [phone] 04/28/2020 1:47 PM

## 2020-05-01 ENCOUNTER — Inpatient Hospital Stay (HOSPITAL_COMMUNITY): Payer: Medicare Other

## 2020-05-01 ENCOUNTER — Inpatient Hospital Stay (HOSPITAL_COMMUNITY)
Admission: RE | Admit: 2020-05-01 | Discharge: 2020-05-04 | DRG: 472 | Disposition: A | Discharge status: Rehabilitation Facility | Payer: Medicare Other | Attending: Neurological Surgery | Admitting: Neurological Surgery

## 2020-05-01 ENCOUNTER — Encounter (HOSPITAL_COMMUNITY): Payer: Self-pay | Admitting: Neurological Surgery

## 2020-05-01 ENCOUNTER — Inpatient Hospital Stay (HOSPITAL_COMMUNITY): Payer: Medicare Other | Admitting: Vascular Surgery

## 2020-05-01 ENCOUNTER — Other Ambulatory Visit: Payer: Self-pay

## 2020-05-01 ENCOUNTER — Encounter (HOSPITAL_COMMUNITY)
Admission: RE | Disposition: A | Discharge status: Rehabilitation Facility | Payer: Self-pay | Source: Home / Self Care | Attending: Neurological Surgery

## 2020-05-01 DIAGNOSIS — Z7982 Long term (current) use of aspirin: Secondary | ICD-10-CM

## 2020-05-01 DIAGNOSIS — I129 Hypertensive chronic kidney disease with stage 1 through stage 4 chronic kidney disease, or unspecified chronic kidney disease: Secondary | ICD-10-CM | POA: Diagnosis present

## 2020-05-01 DIAGNOSIS — E785 Hyperlipidemia, unspecified: Secondary | ICD-10-CM | POA: Diagnosis present

## 2020-05-01 DIAGNOSIS — M4322 Fusion of spine, cervical region: Secondary | ICD-10-CM | POA: Diagnosis not present

## 2020-05-01 DIAGNOSIS — M109 Gout, unspecified: Secondary | ICD-10-CM | POA: Diagnosis present

## 2020-05-01 DIAGNOSIS — Z8546 Personal history of malignant neoplasm of prostate: Secondary | ICD-10-CM

## 2020-05-01 DIAGNOSIS — M50022 Cervical disc disorder at C5-C6 level with myelopathy: Secondary | ICD-10-CM | POA: Diagnosis not present

## 2020-05-01 DIAGNOSIS — Z951 Presence of aortocoronary bypass graft: Secondary | ICD-10-CM

## 2020-05-01 DIAGNOSIS — Z8616 Personal history of COVID-19: Secondary | ICD-10-CM

## 2020-05-01 DIAGNOSIS — K219 Gastro-esophageal reflux disease without esophagitis: Secondary | ICD-10-CM | POA: Diagnosis present

## 2020-05-01 DIAGNOSIS — G992 Myelopathy in diseases classified elsewhere: Secondary | ICD-10-CM | POA: Diagnosis not present

## 2020-05-01 DIAGNOSIS — N189 Chronic kidney disease, unspecified: Secondary | ICD-10-CM | POA: Diagnosis not present

## 2020-05-01 DIAGNOSIS — E1122 Type 2 diabetes mellitus with diabetic chronic kidney disease: Secondary | ICD-10-CM | POA: Diagnosis present

## 2020-05-01 DIAGNOSIS — Z981 Arthrodesis status: Secondary | ICD-10-CM | POA: Diagnosis not present

## 2020-05-01 DIAGNOSIS — G250 Essential tremor: Secondary | ICD-10-CM | POA: Diagnosis not present

## 2020-05-01 DIAGNOSIS — M50021 Cervical disc disorder at C4-C5 level with myelopathy: Secondary | ICD-10-CM | POA: Diagnosis not present

## 2020-05-01 DIAGNOSIS — M50222 Other cervical disc displacement at C5-C6 level: Secondary | ICD-10-CM | POA: Diagnosis present

## 2020-05-01 DIAGNOSIS — M4802 Spinal stenosis, cervical region: Secondary | ICD-10-CM | POA: Diagnosis not present

## 2020-05-01 DIAGNOSIS — Z419 Encounter for procedure for purposes other than remedying health state, unspecified: Secondary | ICD-10-CM

## 2020-05-01 DIAGNOSIS — I2510 Atherosclerotic heart disease of native coronary artery without angina pectoris: Secondary | ICD-10-CM | POA: Diagnosis present

## 2020-05-01 DIAGNOSIS — M5001 Cervical disc disorder with myelopathy,  high cervical region: Secondary | ICD-10-CM | POA: Diagnosis not present

## 2020-05-01 DIAGNOSIS — Z79899 Other long term (current) drug therapy: Secondary | ICD-10-CM

## 2020-05-01 DIAGNOSIS — I10 Essential (primary) hypertension: Secondary | ICD-10-CM | POA: Diagnosis not present

## 2020-05-01 DIAGNOSIS — M4712 Other spondylosis with myelopathy, cervical region: Secondary | ICD-10-CM | POA: Diagnosis not present

## 2020-05-01 HISTORY — DX: Presence of spectacles and contact lenses: Z97.3

## 2020-05-01 HISTORY — DX: Spinal stenosis, cervical region: M48.02

## 2020-05-01 HISTORY — PX: POSTERIOR CERVICAL FUSION/FORAMINOTOMY: SHX5038

## 2020-05-01 HISTORY — DX: Presence of dental prosthetic device (complete) (partial): Z97.2

## 2020-05-01 LAB — TYPE AND SCREEN
ABO/RH(D): O NEG
Antibody Screen: NEGATIVE

## 2020-05-01 LAB — CBC WITH DIFFERENTIAL/PLATELET
Abs Immature Granulocytes: 0.06 10*3/uL (ref 0.00–0.07)
Basophils Absolute: 0 10*3/uL (ref 0.0–0.1)
Basophils Relative: 1 %
Eosinophils Absolute: 0.4 10*3/uL (ref 0.0–0.5)
Eosinophils Relative: 6 %
HCT: 38.3 % — ABNORMAL LOW (ref 39.0–52.0)
Hemoglobin: 12.5 g/dL — ABNORMAL LOW (ref 13.0–17.0)
Immature Granulocytes: 1 %
Lymphocytes Relative: 33 %
Lymphs Abs: 2 10*3/uL (ref 0.7–4.0)
MCH: 31.5 pg (ref 26.0–34.0)
MCHC: 32.6 g/dL (ref 30.0–36.0)
MCV: 96.5 fL (ref 80.0–100.0)
Monocytes Absolute: 0.6 10*3/uL (ref 0.1–1.0)
Monocytes Relative: 10 %
Neutro Abs: 3.1 10*3/uL (ref 1.7–7.7)
Neutrophils Relative %: 49 %
Platelets: 176 10*3/uL (ref 150–400)
RBC: 3.97 MIL/uL — ABNORMAL LOW (ref 4.22–5.81)
RDW: 15.4 % (ref 11.5–15.5)
WBC: 6.2 10*3/uL (ref 4.0–10.5)
nRBC: 0 % (ref 0.0–0.2)

## 2020-05-01 LAB — GLUCOSE, CAPILLARY
Glucose-Capillary: 150 mg/dL — ABNORMAL HIGH (ref 70–99)
Glucose-Capillary: 174 mg/dL — ABNORMAL HIGH (ref 70–99)
Glucose-Capillary: 227 mg/dL — ABNORMAL HIGH (ref 70–99)
Glucose-Capillary: 240 mg/dL — ABNORMAL HIGH (ref 70–99)

## 2020-05-01 LAB — SURGICAL PCR SCREEN
MRSA, PCR: NEGATIVE
Staphylococcus aureus: NEGATIVE

## 2020-05-01 LAB — BASIC METABOLIC PANEL
Anion gap: 10 (ref 5–15)
BUN: 39 mg/dL — ABNORMAL HIGH (ref 8–23)
CO2: 22 mmol/L (ref 22–32)
Calcium: 9.7 mg/dL (ref 8.9–10.3)
Chloride: 104 mmol/L (ref 98–111)
Creatinine, Ser: 1.97 mg/dL — ABNORMAL HIGH (ref 0.61–1.24)
GFR calc Af Amer: 34 mL/min — ABNORMAL LOW (ref >60)
GFR calc non Af Amer: 29 mL/min — ABNORMAL LOW (ref >60)
Glucose, Bld: 149 mg/dL — ABNORMAL HIGH (ref 70–99)
Potassium: 4.4 mmol/L (ref 3.5–5.1)
Sodium: 136 mmol/L (ref 135–145)

## 2020-05-01 LAB — PROTIME-INR
INR: 1 (ref 0.8–1.2)
Prothrombin Time: 13.2 seconds (ref 11.4–15.2)

## 2020-05-01 SURGERY — POSTERIOR CERVICAL FUSION/FORAMINOTOMY LEVEL 3
Anesthesia: General

## 2020-05-01 MED ORDER — ONDANSETRON HCL 4 MG/2ML IJ SOLN
INTRAMUSCULAR | Status: AC
  Filled 2020-05-01: qty 2

## 2020-05-01 MED ORDER — LISINOPRIL 20 MG PO TABS
20.0000 mg | ORAL_TABLET | Freq: Every day | ORAL | Status: DC
  Administered 2020-05-01 – 2020-05-04 (×4): 20 mg via ORAL
  Filled 2020-05-01 (×4): qty 1

## 2020-05-01 MED ORDER — ONDANSETRON HCL 4 MG/2ML IJ SOLN
INTRAMUSCULAR | Status: DC | PRN
  Administered 2020-05-01: 4 mg via INTRAVENOUS

## 2020-05-01 MED ORDER — ESMOLOL HCL 100 MG/10ML IV SOLN
INTRAVENOUS | Status: AC
  Filled 2020-05-01: qty 10

## 2020-05-01 MED ORDER — ONDANSETRON HCL 4 MG/2ML IJ SOLN: 4.0000 mg | Freq: Once | INTRAMUSCULAR | Status: DC | PRN

## 2020-05-01 MED ORDER — CHLORHEXIDINE GLUCONATE 0.12 % MT SOLN
15.0000 mL | Freq: Once | OROMUCOSAL | Status: AC
  Administered 2020-05-01: 15 mL via OROMUCOSAL
  Filled 2020-05-01: qty 15

## 2020-05-01 MED ORDER — TAMSULOSIN HCL 0.4 MG PO CAPS
0.4000 mg | ORAL_CAPSULE | Freq: Every day | ORAL | Status: DC
  Administered 2020-05-02 – 2020-05-04 (×3): 0.4 mg via ORAL
  Filled 2020-05-01 (×3): qty 1

## 2020-05-01 MED ORDER — BUPIVACAINE HCL (PF) 0.25 % IJ SOLN
INTRAMUSCULAR | Status: DC | PRN
  Administered 2020-05-01: 10 mL

## 2020-05-01 MED ORDER — DEXAMETHASONE SODIUM PHOSPHATE 4 MG/ML IJ SOLN
4.0000 mg | Freq: Four times a day (QID) | INTRAMUSCULAR | Status: DC
  Administered 2020-05-01 – 2020-05-03 (×5): 4 mg via INTRAVENOUS
  Filled 2020-05-01 (×5): qty 1

## 2020-05-01 MED ORDER — LIDOCAINE 2% (20 MG/ML) 5 ML SYRINGE
INTRAMUSCULAR | Status: AC
  Filled 2020-05-01: qty 5

## 2020-05-01 MED ORDER — LIDOCAINE 2% (20 MG/ML) 5 ML SYRINGE
INTRAMUSCULAR | Status: DC | PRN
  Administered 2020-05-01: 100 mg via INTRAVENOUS

## 2020-05-01 MED ORDER — CEFAZOLIN SODIUM-DEXTROSE 2-4 GM/100ML-% IV SOLN
2.0000 g | Freq: Three times a day (TID) | INTRAVENOUS | Status: AC
  Administered 2020-05-01 (×2): 2 g via INTRAVENOUS
  Filled 2020-05-01 (×2): qty 100

## 2020-05-01 MED ORDER — METHOCARBAMOL 1000 MG/10ML IJ SOLN
500.0000 mg | Freq: Four times a day (QID) | INTRAVENOUS | Status: DC | PRN
  Filled 2020-05-01: qty 5

## 2020-05-01 MED ORDER — SUGAMMADEX SODIUM 200 MG/2ML IV SOLN
INTRAVENOUS | Status: DC | PRN
  Administered 2020-05-01: 170 mg via INTRAVENOUS

## 2020-05-01 MED ORDER — PHENYLEPHRINE 40 MCG/ML (10ML) SYRINGE FOR IV PUSH (FOR BLOOD PRESSURE SUPPORT)
PREFILLED_SYRINGE | INTRAVENOUS | Status: AC
  Filled 2020-05-01: qty 10

## 2020-05-01 MED ORDER — PHENYLEPHRINE HCL-NACL 10-0.9 MG/250ML-% IV SOLN
INTRAVENOUS | Status: DC | PRN
  Administered 2020-05-01: 25 ug/min via INTRAVENOUS

## 2020-05-01 MED ORDER — BUPIVACAINE HCL (PF) 0.25 % IJ SOLN
INTRAMUSCULAR | Status: AC
  Filled 2020-05-01: qty 30

## 2020-05-01 MED ORDER — GLYCOPYRROLATE PF 0.2 MG/ML IJ SOSY
PREFILLED_SYRINGE | INTRAMUSCULAR | Status: AC
  Filled 2020-05-01: qty 1

## 2020-05-01 MED ORDER — ALLOPURINOL 300 MG PO TABS
300.0000 mg | ORAL_TABLET | Freq: Every day | ORAL | Status: DC
  Administered 2020-05-02 – 2020-05-04 (×3): 300 mg via ORAL
  Filled 2020-05-01 (×3): qty 1

## 2020-05-01 MED ORDER — FENTANYL CITRATE (PF) 250 MCG/5ML IJ SOLN
INTRAMUSCULAR | Status: AC
  Filled 2020-05-01: qty 5

## 2020-05-01 MED ORDER — FENTANYL CITRATE (PF) 100 MCG/2ML IJ SOLN
INTRAMUSCULAR | Status: AC
  Filled 2020-05-01: qty 2

## 2020-05-01 MED ORDER — METHOCARBAMOL 500 MG PO TABS
500.0000 mg | ORAL_TABLET | Freq: Four times a day (QID) | ORAL | Status: DC | PRN
  Administered 2020-05-01 – 2020-05-04 (×9): 500 mg via ORAL
  Filled 2020-05-01 (×10): qty 1

## 2020-05-01 MED ORDER — LACTATED RINGERS IV SOLN: INTRAVENOUS | Status: DC

## 2020-05-01 MED ORDER — THROMBIN 20000 UNITS EX SOLR
CUTANEOUS | Status: AC
  Filled 2020-05-01: qty 20000

## 2020-05-01 MED ORDER — PROPOFOL 10 MG/ML IV BOLUS
INTRAVENOUS | Status: DC | PRN
  Administered 2020-05-01: 120 mg via INTRAVENOUS

## 2020-05-01 MED ORDER — SODIUM CHLORIDE 0.9% FLUSH: 3.0000 mL | INTRAVENOUS | Status: DC | PRN

## 2020-05-01 MED ORDER — FENTANYL CITRATE (PF) 100 MCG/2ML IJ SOLN
25.0000 ug | INTRAMUSCULAR | Status: DC | PRN
  Administered 2020-05-01 (×3): 50 ug via INTRAVENOUS

## 2020-05-01 MED ORDER — ONDANSETRON HCL 4 MG/2ML IJ SOLN: 4.0000 mg | Freq: Four times a day (QID) | INTRAMUSCULAR | Status: DC | PRN

## 2020-05-01 MED ORDER — ALBUMIN HUMAN 5 % IV SOLN: INTRAVENOUS | Status: DC | PRN

## 2020-05-01 MED ORDER — ROCURONIUM BROMIDE 10 MG/ML (PF) SYRINGE
PREFILLED_SYRINGE | INTRAVENOUS | Status: DC | PRN
  Administered 2020-05-01: 30 mg via INTRAVENOUS
  Administered 2020-05-01: 60 mg via INTRAVENOUS
  Administered 2020-05-01: 10 mg via INTRAVENOUS

## 2020-05-01 MED ORDER — FENTANYL CITRATE (PF) 100 MCG/2ML IJ SOLN
INTRAMUSCULAR | Status: AC
Start: 2020-05-01 — End: 2020-05-02
  Filled 2020-05-01: qty 2

## 2020-05-01 MED ORDER — DEXAMETHASONE SODIUM PHOSPHATE 10 MG/ML IJ SOLN
INTRAMUSCULAR | Status: AC
  Filled 2020-05-01: qty 1

## 2020-05-01 MED ORDER — CARVEDILOL PHOSPHATE ER 20 MG PO CP24
20.0000 mg | ORAL_CAPSULE | Freq: Every day | ORAL | Status: DC
  Administered 2020-05-02 – 2020-05-04 (×3): 20 mg via ORAL
  Filled 2020-05-01 (×3): qty 1

## 2020-05-01 MED ORDER — ACETAMINOPHEN 650 MG RE SUPP: 650.0000 mg | RECTAL | Status: DC | PRN

## 2020-05-01 MED ORDER — ACETAMINOPHEN 325 MG PO TABS
650.0000 mg | ORAL_TABLET | ORAL | Status: DC | PRN
  Administered 2020-05-01 – 2020-05-04 (×7): 650 mg via ORAL
  Filled 2020-05-01 (×7): qty 2

## 2020-05-01 MED ORDER — DEXAMETHASONE 4 MG PO TABS
4.0000 mg | ORAL_TABLET | Freq: Four times a day (QID) | ORAL | Status: DC
  Administered 2020-05-01 – 2020-05-02 (×3): 4 mg via ORAL
  Filled 2020-05-01 (×3): qty 1

## 2020-05-01 MED ORDER — POTASSIUM CHLORIDE IN NACL 20-0.9 MEQ/L-% IV SOLN: INTRAVENOUS | Status: DC

## 2020-05-01 MED ORDER — THROMBIN 5000 UNITS EX SOLR
CUTANEOUS | Status: AC
  Filled 2020-05-01: qty 5000

## 2020-05-01 MED ORDER — VANCOMYCIN HCL 1000 MG IV SOLR
INTRAVENOUS | Status: AC
  Filled 2020-05-01: qty 1000

## 2020-05-01 MED ORDER — ROCURONIUM BROMIDE 10 MG/ML (PF) SYRINGE
PREFILLED_SYRINGE | INTRAVENOUS | Status: AC
  Filled 2020-05-01: qty 10

## 2020-05-01 MED ORDER — DEXAMETHASONE SODIUM PHOSPHATE 10 MG/ML IJ SOLN
10.0000 mg | Freq: Once | INTRAMUSCULAR | Status: AC
  Administered 2020-05-01: 10 mg via INTRAVENOUS

## 2020-05-01 MED ORDER — VANCOMYCIN HCL 1000 MG IV SOLR
INTRAVENOUS | Status: DC | PRN
  Administered 2020-05-01: 1000 mg

## 2020-05-01 MED ORDER — ESMOLOL HCL 100 MG/10ML IV SOLN
INTRAVENOUS | Status: DC | PRN
  Administered 2020-05-01: 20 mg via INTRAVENOUS

## 2020-05-01 MED ORDER — SODIUM CHLORIDE 0.9% FLUSH
3.0000 mL | Freq: Two times a day (BID) | INTRAVENOUS | Status: DC
  Administered 2020-05-01 – 2020-05-04 (×4): 3 mL via INTRAVENOUS

## 2020-05-01 MED ORDER — ONDANSETRON HCL 4 MG PO TABS: 4.0000 mg | ORAL_TABLET | Freq: Four times a day (QID) | ORAL | Status: DC | PRN

## 2020-05-01 MED ORDER — PHENOL 1.4 % MT LIQD: 1.0000 | OROMUCOSAL | Status: DC | PRN

## 2020-05-01 MED ORDER — PRIMIDONE 50 MG PO TABS
25.0000 mg | ORAL_TABLET | Freq: Every day | ORAL | Status: DC
  Administered 2020-05-02 – 2020-05-04 (×3): 25 mg via ORAL
  Filled 2020-05-01 (×3): qty 0.5

## 2020-05-01 MED ORDER — CHLORHEXIDINE GLUCONATE CLOTH 2 % EX PADS: 6.0000 | MEDICATED_PAD | Freq: Once | CUTANEOUS | Status: DC

## 2020-05-01 MED ORDER — MORPHINE SULFATE (PF) 2 MG/ML IV SOLN: 2.0000 mg | INTRAVENOUS | Status: DC | PRN

## 2020-05-01 MED ORDER — OXYCODONE HCL 5 MG PO TABS
5.0000 mg | ORAL_TABLET | ORAL | Status: DC | PRN
  Administered 2020-05-01 – 2020-05-03 (×9): 10 mg via ORAL
  Administered 2020-05-03: 5 mg via ORAL
  Administered 2020-05-03 (×2): 10 mg via ORAL
  Administered 2020-05-04: 5 mg via ORAL
  Filled 2020-05-01 (×8): qty 2
  Filled 2020-05-01: qty 1
  Filled 2020-05-01: qty 2
  Filled 2020-05-01: qty 1
  Filled 2020-05-01 (×2): qty 2

## 2020-05-01 MED ORDER — FENTANYL CITRATE (PF) 250 MCG/5ML IJ SOLN
INTRAMUSCULAR | Status: DC | PRN
Start: 2020-05-01 — End: 2020-05-01
  Administered 2020-05-01: 100 ug via INTRAVENOUS
  Administered 2020-05-01: 50 ug via INTRAVENOUS

## 2020-05-01 MED ORDER — LACTATED RINGERS IV SOLN: INTRAVENOUS | Status: DC | PRN

## 2020-05-01 MED ORDER — GLYCOPYRROLATE PF 0.2 MG/ML IJ SOSY
PREFILLED_SYRINGE | INTRAMUSCULAR | Status: DC | PRN
  Administered 2020-05-01 (×2): .1 mg via INTRAVENOUS

## 2020-05-01 MED ORDER — MENTHOL 3 MG MT LOZG: 1.0000 | LOZENGE | OROMUCOSAL | Status: DC | PRN

## 2020-05-01 MED ORDER — BACITRACIN ZINC 500 UNIT/GM EX OINT
TOPICAL_OINTMENT | CUTANEOUS | Status: AC
  Filled 2020-05-01: qty 28.35

## 2020-05-01 MED ORDER — ORAL CARE MOUTH RINSE: 15.0000 mL | Freq: Once | OROMUCOSAL | Status: AC

## 2020-05-01 MED ORDER — SODIUM CHLORIDE 0.9 % IV SOLN
250.0000 mL | INTRAVENOUS | Status: DC
  Administered 2020-05-01: 250 mL via INTRAVENOUS

## 2020-05-01 MED ORDER — SODIUM CHLORIDE 0.9 % IV SOLN
INTRAVENOUS | Status: DC | PRN
  Administered 2020-05-01: 500 mL

## 2020-05-01 MED ORDER — PHENYLEPHRINE 40 MCG/ML (10ML) SYRINGE FOR IV PUSH (FOR BLOOD PRESSURE SUPPORT)
PREFILLED_SYRINGE | INTRAVENOUS | Status: DC | PRN
  Administered 2020-05-01: 40 ug via INTRAVENOUS
  Administered 2020-05-01 (×2): 80 ug via INTRAVENOUS

## 2020-05-01 MED ORDER — PROPOFOL 10 MG/ML IV BOLUS
INTRAVENOUS | Status: AC
  Filled 2020-05-01: qty 20

## 2020-05-01 MED ORDER — 0.9 % SODIUM CHLORIDE (POUR BTL) OPTIME
TOPICAL | Status: DC | PRN
  Administered 2020-05-01: 1000 mL

## 2020-05-01 MED ORDER — THROMBIN 20000 UNITS EX SOLR
CUTANEOUS | Status: DC | PRN
  Administered 2020-05-01: 20 mL via TOPICAL

## 2020-05-01 MED ORDER — SENNA 8.6 MG PO TABS
1.0000 | ORAL_TABLET | Freq: Two times a day (BID) | ORAL | Status: DC
  Administered 2020-05-01 – 2020-05-04 (×7): 8.6 mg via ORAL
  Filled 2020-05-01 (×7): qty 1

## 2020-05-01 MED ORDER — CEFAZOLIN SODIUM-DEXTROSE 2-4 GM/100ML-% IV SOLN
2.0000 g | INTRAVENOUS | Status: AC
  Administered 2020-05-01: 2 g via INTRAVENOUS
  Filled 2020-05-01: qty 100

## 2020-05-01 MED ORDER — THROMBIN 5000 UNITS EX SOLR
OROMUCOSAL | Status: DC | PRN
  Administered 2020-05-01: 5 mL via TOPICAL

## 2020-05-01 MED ORDER — OXYCODONE HCL 5 MG PO TABS
5.0000 mg | ORAL_TABLET | ORAL | Status: DC | PRN
  Administered 2020-05-01: 5 mg via ORAL
  Filled 2020-05-01: qty 1

## 2020-05-01 SURGICAL SUPPLY — 62 items
ADH SKN CLS APL DERMABOND .7 (GAUZE/BANDAGES/DRESSINGS) ×1
APL SKNCLS STERI-STRIP NONHPOA (GAUZE/BANDAGES/DRESSINGS) ×1
BAG DECANTER FOR FLEXI CONT (MISCELLANEOUS) ×3 IMPLANT
BAND INSRT 18 STRL LF DISP RB (MISCELLANEOUS)
BAND RUBBER #18 3X1/16 STRL (MISCELLANEOUS) IMPLANT
BENZOIN TINCTURE PRP APPL 2/3 (GAUZE/BANDAGES/DRESSINGS) ×3 IMPLANT
BIT DRILL INVICTUS SUB 2.1 STR (BIT) ×3 IMPLANT
BLADE CLIPPER SURG (BLADE) IMPLANT
BUR CARBIDE MATCH 3.0 (BURR) ×3 IMPLANT
CANISTER SUCT 3000ML PPV (MISCELLANEOUS) ×3 IMPLANT
CARTRIDGE OIL MAESTRO DRILL (MISCELLANEOUS) ×1 IMPLANT
CLOSURE WOUND 1/2 X4 (GAUZE/BANDAGES/DRESSINGS) ×1
COVER WAND RF STERILE (DRAPES) ×3 IMPLANT
DERMABOND ADVANCED (GAUZE/BANDAGES/DRESSINGS) ×2
DERMABOND ADVANCED .7 DNX12 (GAUZE/BANDAGES/DRESSINGS) ×1 IMPLANT
DIFFUSER DRILL AIR PNEUMATIC (MISCELLANEOUS) ×3 IMPLANT
DRAPE C-ARM 42X72 X-RAY (DRAPES) ×6 IMPLANT
DRAPE LAPAROTOMY 100X72 PEDS (DRAPES) ×3 IMPLANT
DRAPE MICROSCOPE LEICA (MISCELLANEOUS) IMPLANT
DRSG OPSITE POSTOP 4X6 (GAUZE/BANDAGES/DRESSINGS) ×3 IMPLANT
DURAPREP 26ML APPLICATOR (WOUND CARE) ×3 IMPLANT
ELECT REM PT RETURN 9FT ADLT (ELECTROSURGICAL) ×3
ELECTRODE REM PT RTRN 9FT ADLT (ELECTROSURGICAL) ×1 IMPLANT
EVACUATOR 1/8 PVC DRAIN (DRAIN) ×3 IMPLANT
GAUZE 4X4 16PLY RFD (DISPOSABLE) IMPLANT
GAUZE SPONGE 4X4 12PLY STRL (GAUZE/BANDAGES/DRESSINGS) ×3 IMPLANT
GLOVE BIO SURGEON STRL SZ7 (GLOVE) ×3 IMPLANT
GLOVE BIO SURGEON STRL SZ8 (GLOVE) ×3 IMPLANT
GLOVE BIOGEL PI IND STRL 7.0 (GLOVE) ×1 IMPLANT
GLOVE BIOGEL PI INDICATOR 7.0 (GLOVE) ×2
GLOVE ECLIPSE 7.5 STRL STRAW (GLOVE) ×12 IMPLANT
GLOVE SURG SS PI 8.0 STRL IVOR (GLOVE) ×9 IMPLANT
GOWN STRL REUS W/ TWL LRG LVL3 (GOWN DISPOSABLE) ×1 IMPLANT
GOWN STRL REUS W/ TWL XL LVL3 (GOWN DISPOSABLE) ×1 IMPLANT
GOWN STRL REUS W/TWL 2XL LVL3 (GOWN DISPOSABLE) ×6 IMPLANT
GOWN STRL REUS W/TWL LRG LVL3 (GOWN DISPOSABLE) ×3
GOWN STRL REUS W/TWL XL LVL3 (GOWN DISPOSABLE) ×3
HEMOSTAT POWDER KIT SURGIFOAM (HEMOSTASIS) ×3 IMPLANT
KIT BASIN OR (CUSTOM PROCEDURE TRAY) ×3 IMPLANT
KIT TURNOVER KIT B (KITS) ×3 IMPLANT
NEEDLE HYPO 22GX1.5 SAFETY (NEEDLE) ×3 IMPLANT
NEEDLE SPNL 20GX3.5 QUINCKE YW (NEEDLE) ×3 IMPLANT
NS IRRIG 1000ML POUR BTL (IV SOLUTION) ×3 IMPLANT
OIL CARTRIDGE MAESTRO DRILL (MISCELLANEOUS) ×3
PACK LAMINECTOMY NEURO (CUSTOM PROCEDURE TRAY) ×3 IMPLANT
PAD ARMBOARD 7.5X6 YLW CONV (MISCELLANEOUS) ×3 IMPLANT
PIN MAYFIELD SKULL DISP (PIN) ×3 IMPLANT
PUTTY DBM 5CC ×3 IMPLANT
ROD LORD TI 3.5 X 60 (Rod) ×3 IMPLANT
ROD TI LORDOTIC 3.5X50 (Rod) ×3 IMPLANT
SCREW POLYAXIAL 3.5 X 14 (Screw) ×24 IMPLANT
SCREW SET ATEC (Screw) ×24 IMPLANT
SPONGE LAP 4X18 RFD (DISPOSABLE) IMPLANT
SPONGE SURGIFOAM ABS GEL 100 (HEMOSTASIS) IMPLANT
STRIP CLOSURE SKIN 1/2X4 (GAUZE/BANDAGES/DRESSINGS) ×2 IMPLANT
SUT VIC AB 0 CT1 18XCR BRD8 (SUTURE) ×1 IMPLANT
SUT VIC AB 0 CT1 8-18 (SUTURE) ×3
SUT VIC AB 2-0 CP2 18 (SUTURE) ×3 IMPLANT
SUT VIC AB 3-0 SH 8-18 (SUTURE) ×6 IMPLANT
TOWEL GREEN STERILE (TOWEL DISPOSABLE) ×3 IMPLANT
TOWEL GREEN STERILE FF (TOWEL DISPOSABLE) ×3 IMPLANT
WATER STERILE IRR 1000ML POUR (IV SOLUTION) ×3 IMPLANT

## 2020-05-01 NOTE — H&P (Signed)
Subjective:   Patient is a 86 y.o. male admitted for myelopathy with gait disturbance and numbness in the hands. The patient first presented to me with complaints of numbness of the arm(s) and difficulty with gait. Onset of symptoms was several months ago. The pain is described as aching and occurs intermittently. The pain is rated mild, and is located in the neck and radiates to the shoulders. The symptoms have been progressive. Symptoms are exacerbated by extending head backwards, and are relieved by none.  Previous work up includes MRI of cervical spine, results: spinal stenosis.  Past Medical History:  Diagnosis Date  . Acute renal failure ([place]) 12/06/2013  . Back pain    occasionally  . Benign essential tremor 2005   takes Primidone daily  . Choledocholithiasis 12/06/2013  . Constipation    takes OTC stool softener  . Coronary artery disease 2007   s/p CABG, DR Johnsie Cancel  . GERD (gastroesophageal reflux disease)    takes Nexium daily  . Gout 1995   "~ once/yr" (03/17/2014)  . History of colon polyps   . Hyperlipidemia 12/2005   takes Atorvastatin daily  . Hypertension 1995   takes Lisinopril and Coreg daily  . possible HCAP (healthcare-associated pneumonia) 12/09/2013  . Prostate cancer ([place]) 2007   S/P treatment, followed by Urology prev  . Right bundle branch block (RBBB)   . Stenosis of cervical spine   . Type II diabetes mellitus ([facility])    no meds;diet and exercise controlled   . Wears dentures   . Wears glasses     Past Surgical History:  Procedure Laterality Date  . Adenosine myoview  01/24/2006   Ischemia by EKG, Cath  . CATARACT EXTRACTION W/ INTRAOCULAR LENS IMPLANT Left 10/2013  . CHOLECYSTECTOMY N/A 12/06/2013   Procedure: Diagnostic Laparoscopy for  drainage Intrabdominal abcess;  Surgeon: Rolm Bookbinder, MD;  Location: [facility];  Service: General;  Laterality: N/A;  . CHOLECYSTECTOMY N/A 03/17/2014   Procedure: LAPAROSCOPIC CHOLECYSTECTOMY ;  Surgeon: Rolm Bookbinder,  MD;  Location: [facility];  Service: General;  Laterality: N/A;  . COLONOSCOPY    . CORONARY ARTERY BYPASS GRAFT  2007   CABGX 4  . CYSTOSCOPY  02/15/2004   Mod BPH, moder tribec ?  Marland Kitchen ERCP N/A 12/06/2013   Procedure: ENDOSCOPIC RETROGRADE CHOLANGIOPANCREATOGRAPHY (ERCP);  Surgeon: Milus Banister, MD;  Location: [facility];  Service: Endoscopy;  Laterality: N/A;  . LAPAROSCOPIC CHOLECYSTECTOMY  03/17/2014  . LUMBAR FUSION  02/2012   lumbar spine for spinal stenosis  . PROSTATE CRYOABLATION  09/18/2005   prostate CA    Allergies  Allergen Reactions  . Sulfonamide Derivatives     REACTION: lethargic    Social History   Tobacco Use  . Smoking status: Never Smoker  . Smokeless tobacco: Never Used  Substance Use Topics  . Alcohol use: No    Family History  Problem Relation Age of Onset  . Heart disease Mother   . Cancer Mother   . Cancer Sister   . Diabetes Sister   . Cancer Brother        prostate  . Prostate cancer Brother   . Hypothyroidism Brother   . Depression Neg Hx   . Alcohol abuse Neg Hx   . Drug abuse Neg Hx   . Stroke Neg Hx   . Colon cancer Neg Hx    Prior to Admission medications   Medication Sig Start Date End Date Taking? Authorizing Provider  allopurinol (ZYLOPRIM) 300 MG tablet  Take 1 tablet (300 mg total) by mouth daily. 01/18/20  Yes Tonia Ghent, MD  aspirin 81 MG tablet Take 81 mg by mouth daily.   Yes [provider]  atorvastatin (LIPITOR) 40 MG tablet Take 1 tablet (40 mg total) by mouth daily. 01/18/20  Yes Tonia Ghent, MD  carvedilol (COREG CR) 20 MG 24 hr capsule Take 1 capsule (20 mg total) by mouth daily. 01/18/20  Yes Tonia Ghent, MD  lisinopril (ZESTRIL) 20 MG tablet Take 1 tablet (20 mg total) by mouth daily. 01/18/20  Yes Tonia Ghent, MD  Nutritional Supplements (CARNATION BREAKFAST ESSENTIALS PO) Take 1 Package by mouth daily.    Yes [provider]  omeprazole (PRILOSEC) 40 MG capsule Take 1 capsule (40 mg total) by  mouth daily as needed. Patient taking differently: Take 40 mg by mouth daily as needed (Heartburn).  01/18/20  Yes Tonia Ghent, MD  primidone (MYSOLINE) 50 MG tablet Take 0.5 tablets (25 mg total) by mouth daily. 01/18/20  Yes Tonia Ghent, MD  tamsulosin (FLOMAX) 0.4 MG CAPS capsule Take 1 capsule (0.4 mg total) by mouth daily. 01/18/20  Yes Tonia Ghent, MD  vitamin B-12 (CYANOCOBALAMIN) 1000 MCG tablet Take 1 tablet (1,000 mcg total) by mouth daily. 01/20/20  Yes Tonia Ghent, MD     Review of Systems  Positive ROS: neg  All other systems have been reviewed and were otherwise negative with the exception of those mentioned in the HPI and as above.  Objective: Vital signs in last 24 hours: Temp:  [98.7 F (37.1 C)] 98.7 F (37.1 C) (08/23 0647) Pulse Rate:  [58] 58 (08/23 0647) Resp:  [18] 18 (08/23 0647) BP: (169)/(71) 169/71 (08/23 0647) SpO2:  [100 %] 100 % (08/23 0647) Weight:  [83.9 kg] 83.9 kg (08/23 0647)  General Appearance: Alert, cooperative, no distress, appears stated age Head: Normocephalic, without obvious abnormality, atraumatic Eyes: PERRL, conjunctiva/corneas clear, EOM's intact      Neck: Supple, symmetrical, trachea midline, Back: Symmetric, no curvature, ROM normal, no CVA tenderness Lungs:  respirations unlabored Heart: Regular rate and rhythm Abdomen: Soft, non-tender Extremities: Extremities normal, atraumatic, no cyanosis or edema Pulses: 2+ and symmetric all extremities Skin: Skin color, texture, turgor normal, no rashes or lesions  NEUROLOGIC:  Mental status: Alert and oriented x4, no aphasia, good attention span, fund of knowledge and memory  Motor Exam - grossly normal Sensory Exam - grossly normal Reflexes: 3+ Coordination - grossly normal Gait - spastic Balance - abnormal Cranial Nerves: I: smell Not tested  II: visual acuity  OS: nl    OD: nl  II: visual fields Full to confrontation  II: pupils Equal, round, reactive to  light  III,VII: ptosis None  III,IV,VI: extraocular muscles  Full ROM  V: mastication Normal  V: facial light touch sensation  Normal  V,VII: corneal reflex  Present  VII: facial muscle function - upper  Normal  VII: facial muscle function - lower Normal  VIII: hearing Not tested  IX: soft palate elevation  Normal  IX,X: gag reflex Present  XI: trapezius strength  5/5  XI: sternocleidomastoid strength 5/5  XI: neck flexion strength  5/5  XII: tongue strength  Normal    Data Review Lab Results  Component Value Date   WBC 6.4 01/18/2020   HGB 12.9 (L) 01/18/2020   HCT 38.4 (L) 01/18/2020   MCV 94.6 01/18/2020   PLT 237.0 01/18/2020   Lab Results  Component  Value Date   NA 134 (L) 01/18/2020   K 4.9 01/18/2020   CL 99 01/18/2020   CO2 31 01/18/2020   BUN 31 (H) 01/18/2020   CREATININE 1.55 (H) 01/18/2020   GLUCOSE 173 (H) 01/18/2020   Lab Results  Component Value Date   INR 1.06 03/10/2014    Assessment:   Cervical neck pain with herniated nucleus pulposus/ spondylosis/ stenosis at C3-6 with myelopathy. Estimated body mass index is 25.8 kg/m as calculated from the following:   Height as of this encounter: 5\' 11"  (1.610 m).   Weight as of this encounter: 83.9 kg.  Patient has failed conservative therapy. Planned surgery : CL/PCF C3-6  Plan:   I explained the condition and procedure to the patient and answered any questions.  Patient wishes to proceed with procedure as planned. Understands risks/ benefits/ and expected or typical outcomes.  [name] [name] 05/01/2020 8:36 AM

## 2020-05-01 NOTE — Op Note (Signed)
05/01/2020  11:19 AM  PATIENT:  [name] [name]  86 y.o. male  PRE-OPERATIVE DIAGNOSIS: Severe cervical spinal stenosis C3-4 C4-5 C5-6 with cervical spondylitic myelopathy and gait disturbance  POST-OPERATIVE DIAGNOSIS:  same  PROCEDURE:  1.  Decompressive cervical laminectomy medial facetectomy foraminotomies C3-4 C4-5 C5-6, 2.  Posterior cervical arthrodesis C3-C6 utilizing locally harvested morselized autologous bone graft and morselized allograft, 3.  Segmental fixation C3-C6 inclusive utilizing Alphatec lateral mass screws  SURGEON:  Sherley Bounds, MD  ASSISTANTS: Glenford Peers, FNP  ANESTHESIA:   General  EBL: 70 ml  Total I/O In: 1750 [I.V.:1400; IV Piggyback:350] Out: 70 [Blood:70]  BLOOD ADMINISTERED: none  DRAINS: Medium Hemovac  SPECIMEN:  none  INDICATION FOR PROCEDURE: This patient presented with numbness in his hands and gait disturbance. Imaging showed severe cervical spinal stenosis with spondylosis and cord signal abnormality. The patient tried conservative measures without relief. Pain was debilitating. Recommended decompressive cervical laminectomy with instrumented fusion. Patient understood the risks, benefits, and alternatives and potential outcomes and wished to proceed.  PROCEDURE DETAILS: The patient was brought to the operating room. Generalized endotracheal anesthesia was induced. The patient was affixed a 3 point [place] headrest and rolled into the prone position on chest rolls. All pressure points were padded. The posterior cervical region was cleaned and prepped with DuraPrep and then draped in the usual sterile fashion. 7 cc of local anesthesia was injected and a dorsal midline incision made in the posterior cervical region and carried down to the cervical fascia. The fascia was opened and the paraspinous musculature was taken down to expose C3-C6. Intraoperative fluoroscopy confirmed my level and then the dissection was carried out over the lateral  facets. I localized the midpoint of each lateral mass and marked a region 1 mm medial to the midpoint of the lateral mass, and then drilled in an upward and outward direction into the safe zone of each lateral mass. I drilled to a depth of 14 mm and then checked my drill hole with a ball probe. I then placed a 14 mm lateral mass screws into the safe zone of each lateral mass of C3-C6 inclusive until they were 2 fingers tight. I then gently decompressed the central canal with the 1 and 2 mm Kerrison punch from C3-C6. Medial facetectomies were performed, and foraminotomies were performed at C3 4 C4 5 and C5-6. Once the decompression was complete the dura was full and capacious and I could see the spinal cord pulsatile through the dura. I then decorticated the lateral masses and the facet joints and packed them with local autograft and morcellized allograft to perform arthrodesis from C3 to C6. I then placed rods into the multiaxial screw heads of the screws and locked these into position with the locking caps and anti-torque device. I then checked the final construct with AP/Lat fluoroscopy. I irrigated with saline solution containing bacitracin. I placed a medium Hemovac drain through separate stab incision, and lined the dura with Gelfoam. After hemostasis was achieved we placed powdered vancomycin into the wound and I closed the muscle and the fascia with 0 Vicryl, subcutaneous tissue with 2-0 Vicryl, and the subcuticular tissue with 3-0 Vicryl. The skin was closed with benzoin and Steri-Strips. A sterile dressing was applied, the patient was turned to the supine position and taken out of the headrest, awakened from general anesthesia and transferred to the recovery room in stable condition. At the end of the procedure all sponge, needle and instrument counts were correct.  PLAN OF CARE: Admit to inpatient   PATIENT DISPOSITION:  PACU - hemodynamically stable.   Delay start of Pharmacological VTE agent  (>24hrs) due to surgical blood loss or risk of bleeding:  yes

## 2020-05-01 NOTE — Transfer of Care (Signed)
Immediate Anesthesia Transfer of Care Note  Patient: [name] [name]  Procedure(s) Performed: Posterior cervical fusion with lateral mass fixation - Cervical three - Cervical six, laminectomy Cervical three-six (N/A )  Patient Location: PACU  Anesthesia Type:General  Level of Consciousness: awake and patient cooperative  Airway & Oxygen Therapy: Patient Spontanous Breathing and Patient connected to face mask oxygen  Post-op Assessment: Report given to RN, Post -op Vital signs reviewed and stable and Patient moving all extremities X 4  Post vital signs: Reviewed and stable  Last Vitals:  Vitals Value Taken Time  BP 172/80 05/01/20 1120  Temp    Pulse 65 05/01/20 1123  Resp 12 05/01/20 1123  SpO2 100 % 05/01/20 1123  Vitals shown include unvalidated device data.  Last Pain:  Vitals:   05/01/20 1120  TempSrc:   PainSc: (P) 8       Patients Stated Pain Goal: 4 (38/18/29 9371)  Complications: No complications documented.

## 2020-05-01 NOTE — Anesthesia Postprocedure Evaluation (Signed)
Anesthesia Post Note  Patient: [name] [name]  Procedure(s) Performed: Posterior cervical fusion with lateral mass fixation - Cervical three - Cervical six, laminectomy Cervical three-six (N/A )     Patient location during evaluation: PACU Anesthesia Type: General Level of consciousness: sedated and patient cooperative Pain management: pain level controlled Vital Signs Assessment: post-procedure vital signs reviewed and stable Respiratory status: spontaneous breathing Cardiovascular status: stable Anesthetic complications: no   No complications documented.  Last Vitals:  Vitals:   05/01/20 1301 05/01/20 1608  BP: (!) 174/86 (!) 160/79  Pulse: 61 70  Resp: 18 18  Temp: 36.5 C 36.7 C  SpO2: 99% 100%    Last Pain:  Vitals:   05/01/20 1301  TempSrc: Oral  PainSc:                  Nolon Nations

## 2020-05-01 NOTE — Evaluation (Addendum)
I agree with the following treatment note after review of the documentation. This session was performed under the supervision of a licensed clinician.   Leighton Ruff, PT, DPT  Acute Rehabilitation Services  Pager: [phone]   Physical Therapy Evaluation Patient Details Name: [name] [name] MRN: [id number] DOB: October 03, 1928 Today's Date: 05/01/2020   History of Present Illness  pt is a 86 yo male who is s/p C3-6 posterior fusion. Pt has a PMH of CAD, HLD, HTN, T2DM.   Clinical Impression  Pt was evaluated for the above diagnosis and impairments below. Pt required supervision to min guard assist with all mobility tasks assessed. Pt was educated on spinal precautions and walking program. Pt lives at his daughter's home with her family and has assistance intermittently. Educated about using RW at home. Pt would continue to benefit from acute therapy to ensure his safety with functional tasks and increase his independence. Will continue to follow acutely.     Follow Up Recommendations Home health PT (per surgeon orders)    Equipment Recommendations  None recommended by PT    Recommendations for Other Services       Precautions / Restrictions Precautions Precautions: Cervical;Fall Precaution Booklet Issued: No Precaution Comments: pt educated on cervical spine precautions Restrictions Weight Bearing Restrictions: No      Mobility  Bed Mobility Overal bed mobility: Needs Assistance Bed Mobility: Sit to Sidelying;Rolling Rolling: Supervision       Sit to sidelying: Supervision General bed mobility comments: pt was sitting at EOB when entered the room. Pt required supervision for safety with spinal precautions to return to supine   Transfers Overall transfer level: Needs assistance Equipment used: Rolling walker (2 wheeled) Transfers: Sit to/from Stand Sit to Stand: Min guard         General transfer comment: pt required min gaurd assist for safety with RW for  sit<>stand transfer. pt required cues for hand placement on RW with transfer  Ambulation/Gait Ambulation/Gait assistance: Min guard Gait Distance (Feet): 150 Feet Assistive device: Rolling walker (2 wheeled) Gait Pattern/deviations: Step-through pattern;Decreased dorsiflexion - right;Decreased dorsiflexion - left;Decreased step length - left;Decreased step length - right Gait velocity: decreased   General Gait Details: pt demonstrated mildly slow and guarded gait. Pt rquired min guard assist for safety with RW for ambulation. Pt was cued for increasing DF with initial heel contact and for RW safety and proximity to device  Stairs            Wheelchair Mobility    Modified Rankin (Stroke Patients Only)       Balance Overall balance assessment: Needs assistance Sitting-balance support: Feet supported;No upper extremity supported Sitting balance-Leahy Scale: Fair Sitting balance - Comments: pt able to sit EOB without UE support   Standing balance support: Bilateral upper extremity supported;During functional activity Standing balance-Leahy Scale: Poor Standing balance comment: pt reliant on RW for UE support              Pertinent Vitals/Pain Pain Assessment: 0-10 Pain Score: 9  Pain Location: neck Pain Descriptors / Indicators: Operative site guarding;Guarding;Sore Pain Intervention(s): Limited activity within patient's tolerance;Monitored during session;Ice applied;Repositioned    Home Living Family/patient expects to be discharged to:: Private residence Living Arrangements: Children;Other relatives Available Help at Discharge: Family;Available PRN/intermittently Type of Home: House Home Access: Stairs to enter Entrance Stairs-Rails: None Entrance Stairs-Number of Steps: 3 Home Layout: One level Home Equipment: Grab bars - toilet;Shower seat;Walker - 2 wheels;Cane - quad      Prior Function  Level of Independence: Independent with assistive device(s)          Comments: pt requires use of quad cane out in the community      Hand Dominance        Extremity/Trunk Assessment   Upper Extremity Assessment Upper Extremity Assessment: Defer to OT evaluation    Lower Extremity Assessment Lower Extremity Assessment: Generalized weakness    Cervical / Trunk Assessment Cervical / Trunk Assessment: Other exceptions (s/p cervical surgery)  Communication   Communication: No difficulties  Cognition Arousal/Alertness: Awake/alert Behavior During Therapy: WFL for tasks assessed/performed Overall Cognitive Status: Within Functional Limits for tasks assessed                 General Comments General comments (skin integrity, edema, etc.): pt was educated on healing time and walking program     Exercises     Assessment/Plan    PT Assessment Patient needs continued PT services  PT Problem List Decreased strength;Decreased range of motion;Decreased activity tolerance;Decreased balance;Decreased mobility;Decreased coordination;Decreased knowledge of use of DME;Decreased safety awareness;Decreased knowledge of precautions;Pain       PT Treatment Interventions DME instruction;Gait training;Stair training;Functional mobility training;Therapeutic activities;Therapeutic exercise;Balance training;Neuromuscular re-education;Patient/family education;Modalities    PT Goals (Current goals can be found in the Care Plan section)  Acute Rehab PT Goals Patient Stated Goal: get home PT Goal Formulation: With patient Time For Goal Achievement: 05/15/20 Potential to Achieve Goals: Good    Frequency Min 5X/week   Barriers to discharge        Co-evaluation               AM-PAC PT "6 Clicks" Mobility  Outcome Measure Help needed turning from your back to your side while in a flat bed without using bedrails?: None Help needed moving from lying on your back to sitting on the side of a flat bed without using bedrails?: None Help needed moving  to and from a bed to a chair (including a wheelchair)?: A Little Help needed standing up from a chair using your arms (e.g., wheelchair or bedside chair)?: A Little Help needed to walk in hospital room?: A Little Help needed climbing 3-5 steps with a railing? : A Little 6 Click Score: 20    End of Session Equipment Utilized During Treatment: Gait belt Activity Tolerance: Patient tolerated treatment well Patient left: in bed;with call bell/phone within reach;with family/visitor present Nurse Communication: Mobility status PT Visit Diagnosis: Muscle weakness (generalized) (M62.81);Pain;Unsteadiness on feet (R26.81) Pain - part of body:  (neck)    Time: 2683-4196 PT Time Calculation (min) (ACUTE ONLY): 25 min   Charges:   PT Evaluation $PT Eval Low Complexity: 1 Low PT Treatments $Gait Training: 8-22 mins       Gloriann Loan, SPT  Acute Rehabilitation Services  Office: [phone]  05/01/2020, 5:47 PM

## 2020-05-01 NOTE — Anesthesia Procedure Notes (Signed)
Procedure Name: Intubation Date/Time: 05/01/2020 8:51 AM Performed by: Renato Shin, CRNA Pre-anesthesia Checklist: Patient identified, Emergency Drugs available, Suction available and Patient being monitored Patient Re-evaluated:Patient Re-evaluated prior to induction Oxygen Delivery Method: Circle system utilized Preoxygenation: Pre-oxygenation with 100% oxygen Induction Type: IV induction Ventilation: Mask ventilation without difficulty Laryngoscope Size: Miller and 3 Grade View: Grade I Tube type: Oral Tube size: 7.5 mm Number of attempts: 1 Airway Equipment and Method: Stylet and Oral airway Placement Confirmation: ETT inserted through vocal cords under direct vision,  positive ETCO2 and breath sounds checked- equal and bilateral Secured at: 22 cm Tube secured with: Tape Dental Injury: Teeth and Oropharynx as per pre-operative assessment

## 2020-05-02 ENCOUNTER — Encounter (HOSPITAL_COMMUNITY): Payer: Self-pay | Admitting: Neurological Surgery

## 2020-05-02 DIAGNOSIS — T380X5A Adverse effect of glucocorticoids and synthetic analogues, initial encounter: Secondary | ICD-10-CM | POA: Diagnosis present

## 2020-05-02 DIAGNOSIS — Z981 Arthrodesis status: Secondary | ICD-10-CM | POA: Diagnosis not present

## 2020-05-02 DIAGNOSIS — E785 Hyperlipidemia, unspecified: Secondary | ICD-10-CM | POA: Diagnosis present

## 2020-05-02 DIAGNOSIS — D649 Anemia, unspecified: Secondary | ICD-10-CM | POA: Diagnosis not present

## 2020-05-02 DIAGNOSIS — R260 Ataxic gait: Secondary | ICD-10-CM | POA: Diagnosis present

## 2020-05-02 DIAGNOSIS — M542 Cervicalgia: Secondary | ICD-10-CM | POA: Diagnosis not present

## 2020-05-02 DIAGNOSIS — S14109A Unspecified injury at unspecified level of cervical spinal cord, initial encounter: Secondary | ICD-10-CM | POA: Diagnosis not present

## 2020-05-02 DIAGNOSIS — M109 Gout, unspecified: Secondary | ICD-10-CM | POA: Diagnosis present

## 2020-05-02 DIAGNOSIS — N179 Acute kidney failure, unspecified: Secondary | ICD-10-CM | POA: Diagnosis not present

## 2020-05-02 DIAGNOSIS — G588 Other specified mononeuropathies: Secondary | ICD-10-CM | POA: Diagnosis present

## 2020-05-02 DIAGNOSIS — I2510 Atherosclerotic heart disease of native coronary artery without angina pectoris: Secondary | ICD-10-CM | POA: Diagnosis present

## 2020-05-02 DIAGNOSIS — Z4789 Encounter for other orthopedic aftercare: Secondary | ICD-10-CM | POA: Diagnosis not present

## 2020-05-02 DIAGNOSIS — Z8546 Personal history of malignant neoplasm of prostate: Secondary | ICD-10-CM | POA: Diagnosis not present

## 2020-05-02 DIAGNOSIS — E871 Hypo-osmolality and hyponatremia: Secondary | ICD-10-CM | POA: Diagnosis not present

## 2020-05-02 DIAGNOSIS — Z8616 Personal history of COVID-19: Secondary | ICD-10-CM | POA: Diagnosis not present

## 2020-05-02 DIAGNOSIS — G250 Essential tremor: Secondary | ICD-10-CM | POA: Diagnosis present

## 2020-05-02 DIAGNOSIS — R200 Anesthesia of skin: Secondary | ICD-10-CM | POA: Diagnosis present

## 2020-05-02 DIAGNOSIS — K5903 Drug induced constipation: Secondary | ICD-10-CM | POA: Diagnosis not present

## 2020-05-02 DIAGNOSIS — N400 Enlarged prostate without lower urinary tract symptoms: Secondary | ICD-10-CM | POA: Diagnosis present

## 2020-05-02 DIAGNOSIS — I10 Essential (primary) hypertension: Secondary | ICD-10-CM | POA: Diagnosis not present

## 2020-05-02 DIAGNOSIS — E46 Unspecified protein-calorie malnutrition: Secondary | ICD-10-CM | POA: Diagnosis not present

## 2020-05-02 DIAGNOSIS — K219 Gastro-esophageal reflux disease without esophagitis: Secondary | ICD-10-CM | POA: Diagnosis present

## 2020-05-02 DIAGNOSIS — I129 Hypertensive chronic kidney disease with stage 1 through stage 4 chronic kidney disease, or unspecified chronic kidney disease: Secondary | ICD-10-CM | POA: Diagnosis present

## 2020-05-02 DIAGNOSIS — G992 Myelopathy in diseases classified elsewhere: Secondary | ICD-10-CM | POA: Diagnosis not present

## 2020-05-02 DIAGNOSIS — R7401 Elevation of levels of liver transaminase levels: Secondary | ICD-10-CM | POA: Diagnosis not present

## 2020-05-02 DIAGNOSIS — Z7982 Long term (current) use of aspirin: Secondary | ICD-10-CM | POA: Diagnosis not present

## 2020-05-02 DIAGNOSIS — E1165 Type 2 diabetes mellitus with hyperglycemia: Secondary | ICD-10-CM | POA: Diagnosis not present

## 2020-05-02 DIAGNOSIS — Z79899 Other long term (current) drug therapy: Secondary | ICD-10-CM | POA: Diagnosis not present

## 2020-05-02 DIAGNOSIS — M50222 Other cervical disc displacement at C5-C6 level: Secondary | ICD-10-CM | POA: Diagnosis present

## 2020-05-02 DIAGNOSIS — R739 Hyperglycemia, unspecified: Secondary | ICD-10-CM | POA: Diagnosis not present

## 2020-05-02 DIAGNOSIS — M4712 Other spondylosis with myelopathy, cervical region: Secondary | ICD-10-CM | POA: Diagnosis not present

## 2020-05-02 DIAGNOSIS — D62 Acute posthemorrhagic anemia: Secondary | ICD-10-CM | POA: Diagnosis not present

## 2020-05-02 DIAGNOSIS — N189 Chronic kidney disease, unspecified: Secondary | ICD-10-CM | POA: Diagnosis present

## 2020-05-02 DIAGNOSIS — E1122 Type 2 diabetes mellitus with diabetic chronic kidney disease: Secondary | ICD-10-CM | POA: Diagnosis present

## 2020-05-02 DIAGNOSIS — D72828 Other elevated white blood cell count: Secondary | ICD-10-CM | POA: Diagnosis not present

## 2020-05-02 DIAGNOSIS — Z951 Presence of aortocoronary bypass graft: Secondary | ICD-10-CM | POA: Diagnosis not present

## 2020-05-02 DIAGNOSIS — M4802 Spinal stenosis, cervical region: Secondary | ICD-10-CM | POA: Diagnosis not present

## 2020-05-02 DIAGNOSIS — E8809 Other disorders of plasma-protein metabolism, not elsewhere classified: Secondary | ICD-10-CM | POA: Diagnosis not present

## 2020-05-02 LAB — GLUCOSE, CAPILLARY
Glucose-Capillary: 184 mg/dL — ABNORMAL HIGH (ref 70–99)
Glucose-Capillary: 188 mg/dL — ABNORMAL HIGH (ref 70–99)
Glucose-Capillary: 161 mg/dL — ABNORMAL HIGH (ref 70–99)
Glucose-Capillary: 153 mg/dL — ABNORMAL HIGH (ref 70–99)

## 2020-05-02 MED ORDER — ALUM & MAG HYDROXIDE-SIMETH 200-200-20 MG/5ML PO SUSP: 30.0000 mL | Freq: Four times a day (QID) | ORAL | Status: DC | PRN

## 2020-05-02 NOTE — TOC Initial Note (Signed)
Transition of Care [facility]) - Initial/Assessment Note    Patient Details  Name: [name] [name] MRN: [id number] Date of Birth: 1929/08/22  Transition of Care [facility]) CM/SW Contact:    Benard Halsted, LCSW Phone Number: 05/02/2020, 4:07 PM  Clinical Narrative:                 CSW received consult for possible home health services at time of discharge. CSW spoke with patient regarding PT recommendation of CIR/SNF/Home Health PT at time of discharge. Patient reported that he would like SNF because he is concerned about going home. CSW explained that CIR had screened him and they reported he did not qualify for their program. He also does not qualify for SNF without 3 inpatient midnights and is currently walking with minimum assistance. Patient reported understanding and stated he did not like [facility]) the last time he needed home health and wanted one with the best ratings. CSW will send referral for review. CSW provided Medicare [facility] ratings list. CSW confirmed PCP and address with patient. Patient states his daughter will come pick him up at discharge. No further questions reported at this time. CSW to continue to follow and assist with discharge planning needs.   Expected Discharge Plan: [place] Barriers to Discharge: Continued Medical Work up   Patient Goals and CMS Choice Patient states their goals for this hospitalization and ongoing recovery are:: Get stronger CMS Medicare.gov Compare Post Acute Care list provided to:: Patient Choice offered to / list presented to : Patient  Expected Discharge Plan and Services Expected Discharge Plan: [place] In-house Referral: Clinical Social Work Discharge Planning Services: CM Consult Post Acute Care Choice: [place] arrangements for the past 2 months: Single Family Home                           HH Arranged: PT, OT, Nurse's Aide          Prior Living  Arrangements/Services Living arrangements for the past 2 months: Single Family Home Lives with:: Adult Children Patient language and need for interpreter reviewed:: Yes Do you feel safe going back to the place where you live?: Yes      Need for Family Participation in Patient Care: No (Comment) Care giver support system in place?: Yes (comment)   Criminal Activity/Legal Involvement Pertinent to Current Situation/Hospitalization: No - Comment as needed  Activities of Daily Living Home Assistive Devices/Equipment: Eyeglasses, Dentures (specify type), Cane (specify quad or straight), Walker (specify type) ADL Screening (condition at time of admission) Patient's cognitive ability adequate to safely complete daily activities?: Yes Does the patient have difficulty seeing, even when wearing glasses/contacts?: No Does the patient have difficulty concentrating, remembering, or making decisions?: No Patient able to express need for assistance with ADLs?: Yes Does the patient have difficulty dressing or bathing?: No Independently performs ADLs?: Yes (appropriate for developmental age) Does the patient have difficulty walking or climbing stairs?: Yes Weakness of Legs: None Weakness of Arms/Hands: None  Permission Sought/Granted Permission sought to share information with : Facility Art therapist granted to share information with : Yes, Verbal Permission Granted     Permission granted to share info w AGENCY: Home Health        Emotional Assessment Appearance:: Appears stated age Attitude/Demeanor/Rapport: Engaged, Gracious, Charismatic Affect (typically observed): Accepting, Appropriate, Pleasant Orientation: : Oriented to Self, Oriented to Place, Oriented to  Time, Oriented  to Situation Alcohol / Substance Use: Not Applicable Psych Involvement: No (comment)  Admission diagnosis:  S/P cervical spinal fusion [Z98.1] Patient Active Problem List   Diagnosis Date Noted  .  S/P cervical spinal fusion 05/01/2020  . History of agent Orange exposure 04/05/2020  . Paresthesia 01/20/2020  . Health care maintenance 12/28/2017  . GERD (gastroesophageal reflux disease) 12/28/2017  . Medicare annual wellness visit, initial 10/30/2014  . Advance care planning 10/30/2014  . Right bundle branch block (RBBB)   . Wheezing 12/09/2013  . CKD (chronic kidney disease), stage III 12/06/2013  . Back pain 10/20/2011  . Hypothyroidism 03/23/2009  . UNSPECIFIED VITAMIN D DEFICIENCY 03/23/2009  . Tremor 12/18/2006  . Coronary atherosclerosis 12/18/2006  . ADENOCARCINOMA, PROSTATE, HX OF 12/18/2006  . HLD (hyperlipidemia) 12/08/2005  . History of diabetes mellitus 02/07/2005  . Gout 09/09/1993  . Essential hypertension 09/09/1993   PCP:  Tonia Ghent, MD Pharmacy:   [place], [place] [address] [place] [address] Phone: [phone] Fax: [phone]  [place],  - [address], [address], [place] [address] Phone: [phone] Fax: [place], [place] [place] [address] Phone: [phone] Fax: [phone]     Social Determinants of Health (SDOH) Interventions    Readmission Risk Interventions No flowsheet data found.

## 2020-05-02 NOTE — Progress Notes (Signed)
Physical Therapy Treatment Patient Details Name: [name] [name] MRN: [id number] DOB: 1928-11-03 Today's Date: 05/02/2020    History of Present Illness pt is a 86 yo male who is s/p C3-6 posterior fusion. Pt has a PMH of CAD, HLD, HTN, T2DM.     PT Comments    Patient progressing well towards PT goals. Pt concerned about going home due to not having necessary support as family works during the day as well as 2 dogs that like to jump and an elevated bed with slippery wooden floors which increase his fall risk. Pt also with decreased foot clearance RLE>LLE worsened when fatigued. Tolerated stair training with Min A for balance/safety. Reviewed cervical precautions and log roll technique. Discharge recommendation updated to CIR so pt can maximize independence and mobility prior to return home and decrease overall fall risk. MD and RN aware. Will follow.   Follow Up Recommendations  CIR;Supervision for mobility/OOB     Equipment Recommendations  None recommended by PT    Recommendations for Other Services       Precautions / Restrictions Precautions Precautions: Cervical;Fall Precaution Booklet Issued: No Precaution Comments: Reviewed precautions; hemovac Restrictions Weight Bearing Restrictions: No    Mobility  Bed Mobility Overal bed mobility: Needs Assistance Bed Mobility: Rolling;Sidelying to Sit;Sit to Sidelying Rolling: Min guard Sidelying to sit: Min guard;HOB elevated     Sit to sidelying: Min guard General bed mobility comments: Cues for log roll technique, HOB slightly elevated to get OOB and flat without rail with bed at elevated height when returning to supine. Difficulty with log roll to get back into bed.  Transfers Overall transfer level: Needs assistance Equipment used: Rolling walker (2 wheeled) Transfers: Sit to/from Stand Sit to Stand: Min guard         General transfer comment: Min guard for safety. Stood from [employer], from toilet x1. Cues for hand  placement.  Ambulation/Gait Ambulation/Gait assistance: Min guard Gait Distance (Feet): 200 Feet Assistive device: Rolling walker (2 wheeled) Gait Pattern/deviations: Step-through pattern;Decreased dorsiflexion - right;Decreased dorsiflexion - left;Decreased step length - left;Decreased step length - right Gait velocity: decreased Gait velocity interpretation: <1.31 ft/sec, indicative of household ambulator General Gait Details: Slow and guarded gait with decreased foot clearance RLE>LLE, reports difficulty lifting toes. CLose [place] guard for safety and for RW management.   Stairs Stairs: Yes Stairs assistance: Min assist Stair Management: One rail Right Number of Stairs: 4 General stair comments: Cues for safety/technique; Utilized therapist's hand to simulate cane and rail to simulate wall for home. Unsteady.   Wheelchair Mobility    Modified Rankin (Stroke Patients Only)       Balance Overall balance assessment: Needs assistance Sitting-balance support: Feet supported;No upper extremity supported Sitting balance-Leahy Scale: Fair     Standing balance support: During functional activity Standing balance-Leahy Scale: Poor Standing balance comment: pt reliant on RW for UE support                            Cognition Arousal/Alertness: Awake/alert Behavior During Therapy: WFL for tasks assessed/performed Overall Cognitive Status: Within Functional Limits for tasks assessed                                 General Comments: Good awareness of safety.      Exercises      General Comments General comments (skin integrity, edema, etc.): Some mild  weeping under bandage.      Pertinent Vitals/Pain Pain Assessment: 0-10 Pain Score: 4  Pain Location: neck Pain Descriptors / Indicators: Operative site guarding;Guarding;Sore Pain Intervention(s): Monitored during session;Repositioned;Ice applied    Home Living                       Prior Function            PT Goals (current goals can now be found in the care plan section) Progress towards PT goals: Progressing toward goals    Frequency    Min 5X/week      PT Plan Discharge plan needs to be updated    Co-evaluation              AM-PAC PT "6 Clicks" Mobility   Outcome Measure  Help needed turning from your back to your side while in a flat bed without using bedrails?: None Help needed moving from lying on your back to sitting on the side of a flat bed without using bedrails?: A Little Help needed moving to and from a bed to a chair (including a wheelchair)?: A Little Help needed standing up from a chair using your arms (e.g., wheelchair or bedside chair)?: A Little Help needed to walk in hospital room?: A Little Help needed climbing 3-5 steps with a railing? : A Little 6 Click Score: 19    End of Session Equipment Utilized During Treatment: Gait belt Activity Tolerance: Patient tolerated treatment well Patient left: in bed;with call bell/phone within reach Nurse Communication: Mobility status;Other (comment) (concerned about going home, wants rehab) PT Visit Diagnosis: Muscle weakness (generalized) (M62.81);Pain;Unsteadiness on feet (R26.81) Pain - part of body:  (neck)     Time: 4081-4481 PT Time Calculation (min) (ACUTE ONLY): 28 min  Charges:  $Gait Training: 8-22 mins $Therapeutic Activity: 8-22 mins                     Marisa Severin, PT, DPT Acute Rehabilitation Services Pager [phone] Office [place] 05/02/2020, 8:18 AM

## 2020-05-02 NOTE — Evaluation (Addendum)
Occupational Therapy Evaluation Patient Details Name: [name] [name] MRN: [id number] DOB: June 27, 1929 Today's Date: 05/02/2020    History of Present Illness pt is a 86 yo male who is s/p C3-6 posterior fusion. Pt has a PMH of CAD, HLD, HTN, T2DM.    Clinical Impression   PTA patient reports independent using quad cane for mobility, independent ADLs. Patient admitted for above and limited by problem list below, including BUE weakness, tremors and decreased sensation (but reports tremors and numbness baseline), impaired balance and decreased activity tolerance.  Patient currently requires min guard for bed mobility, transfers and in room mobility using RW given cueing for hand placement and RW safety; min guard to supervision for ADLs. He reports concern with not having 24/7 support at home and would like rehab prior to dc home.  Based on performance today, and CIR denial, believe he will benefit from continued OT services at [facility] vs SNF level pending progress to optimize independence and safety with ADls, mobility.  Will follow acutely.     Follow Up Recommendations  Home health OT;SNF (pending progress )    Equipment Recommendations  None recommended by OT    Recommendations for Other Services       Precautions / Restrictions Precautions Precautions: Cervical;Fall Precaution Comments: Reviewed precautions; hemovac Restrictions Weight Bearing Restrictions: No      Mobility Bed Mobility Overal bed mobility: Needs Assistance Bed Mobility: Rolling;Sidelying to Sit;Sit to Sidelying Rolling: Min guard Sidelying to sit: Min guard;HOB elevated     Sit to sidelying: Min guard General bed mobility comments: good recall of log roll technique, min guard for safety   Transfers Overall transfer level: Needs assistance Equipment used: Rolling walker (2 wheeled) Transfers: Sit to/from Stand Sit to Stand: Min guard         General transfer comment: for safety/balance, cueing for hand  placement     Balance Overall balance assessment: Needs assistance Sitting-balance support: Feet supported;No upper extremity supported Sitting balance-Leahy Scale: Good     Standing balance support: Bilateral upper extremity supported;No upper extremity supported;During functional activity Standing balance-Leahy Scale: Poor Standing balance comment: relies on UE support dynamically, able to engage in grooming standing statically with min guard                            ADL either performed or assessed with clinical judgement   ADL Overall ADL's : Needs assistance/impaired     Grooming: Min guard;Standing   Upper Body Bathing: Set up;Sitting   Lower Body Bathing: Sit to/from stand;Min guard   Upper Body Dressing : Sitting;Set up   Lower Body Dressing: Min guard;Sit to/from stand   Toilet Transfer: Min guard;Ambulation;RW           Functional mobility during ADLs: Min guard;Rolling walker;Cueing for safety General ADL Comments: reviewed cervical precautions and ADL compensaotry techniques, pt able to compelte figure 4 technique and requires min guard in standing      Vision         Perception     Praxis      Pertinent Vitals/Pain Pain Assessment: Faces Faces Pain Scale: Hurts little more Pain Location: neck Pain Descriptors / Indicators: Operative site guarding;Guarding;Sore     Hand Dominance     Extremity/Trunk Assessment Upper Extremity Assessment Upper Extremity Assessment: Generalized weakness (within cervical precautions, B UE tremors noted )   Lower Extremity Assessment Lower Extremity Assessment: Defer to PT evaluation   Cervical /  Trunk Assessment Cervical / Trunk Assessment: Other exceptions Cervical / Trunk Exceptions: s/p cervical surgery    Communication Communication Communication: No difficulties   Cognition Arousal/Alertness: Awake/alert Behavior During Therapy: WFL for tasks assessed/performed Overall Cognitive Status:  Within Functional Limits for tasks assessed                                 General Comments: Good awareness of safety, slightly anxious with fear of dcing home and really would like rehab prior to dc home    General Comments  Pt voices concern with dc home and not having 24/7 support; reports concerned about high bed, care of dogs    Exercises     Shoulder Instructions      Home Living Family/patient expects to be discharged to:: Private residence Living Arrangements: Children;Other relatives Available Help at Discharge: Family;Available PRN/intermittently Type of Home: House Home Access: Stairs to enter [employer] of Steps: 3 Entrance Stairs-Rails: None Home Layout: One level     Bathroom Shower/Tub: Teacher, early years/pre: Standard     Home Equipment: Grab bars - toilet;Shower seat;Walker - 2 wheels;Cane - quad          Prior Functioning/Environment Level of Independence: Independent with assistive device(s)        Comments: uses quad cane for mobility, independent ADLs         OT Problem List: Decreased strength;Decreased activity tolerance;Impaired balance (sitting and/or standing);Pain;Decreased knowledge of precautions;Decreased knowledge of use of DME or AE      OT Treatment/Interventions: Self-care/ADL training;DME and/or AE instruction;Therapeutic activities;Patient/family education;Balance training;Therapeutic exercise    OT Goals(Current goals can be found in the care plan section) Acute Rehab OT Goals Patient Stated Goal: to go to rehab  OT Goal Formulation: With patient Time For Goal Achievement: 05/16/20 Potential to Achieve Goals: Good  OT Frequency: Min 2X/week   Barriers to D/C:            Co-evaluation              AM-PAC OT "6 Clicks" Daily Activity     Outcome Measure Help from another person eating meals?: None Help from another person taking care of personal grooming?: A Little Help from  another person toileting, which includes using toliet, bedpan, or urinal?: A Little Help from another person bathing (including washing, rinsing, drying)?: A Little Help from another person to put on and taking off regular upper body clothing?: A Little Help from another person to put on and taking off regular lower body clothing?: A Little 6 Click Score: 19   End of Session Equipment Utilized During Treatment: Rolling walker Nurse Communication: Mobility status  Activity Tolerance: Patient tolerated treatment well Patient left: in bed;with call bell/phone within reach  OT Visit Diagnosis: Other abnormalities of gait and mobility (R26.89);Muscle weakness (generalized) (M62.81);Pain Pain - part of body:  (neck)                Time: 6720-9470 OT Time Calculation (min): 21 min Charges:  OT General Charges $OT Visit: 1 Visit OT Evaluation $OT Eval Moderate Complexity: 1 Mod  Jolaine Artist, OT Acute Rehabilitation Services Pager [phone] Office [phone]    Delight Stare 05/02/2020, 12:54 PM

## 2020-05-02 NOTE — Progress Notes (Signed)
Inpatient Rehab Admissions:  Inpatient Rehab Consult received. Note pt is observation status at this time. Pt may not have the medical necessity to warrant an inpatient rehab stay.  Also note pt already mobilizing at min guard assist for 200' and is too high level for CIR.  Discussed with rehab medical director and agree that pt is not a candidate for CIR.    Signed: Shann Medal, PT, DPT Admissions Coordinator [phone] 05/02/20  9:42 AM

## 2020-05-02 NOTE — Care Management Obs Status (Signed)
[place] NOTIFICATION   Patient Details  Name: [name] [name] MRN: [id number] Date of Birth: Apr 02, 1929   Medicare Observation Status Notification Given:  Yes    Benard Halsted, [place] 05/02/2020, 4:04 PM

## 2020-05-02 NOTE — Progress Notes (Signed)
Patient ID: [name] [name], male   DOB: [birth year], 86 y.o.   MRN: [id number] Doing well, dressing dry, hands "a little" numb, working with PT. SNF vs CIR

## 2020-05-03 ENCOUNTER — Inpatient Hospital Stay (HOSPITAL_COMMUNITY): Payer: Medicare Other

## 2020-05-03 LAB — GLUCOSE, CAPILLARY
Glucose-Capillary: 178 mg/dL — ABNORMAL HIGH (ref 70–99)
Glucose-Capillary: 171 mg/dL — ABNORMAL HIGH (ref 70–99)
Glucose-Capillary: 185 mg/dL — ABNORMAL HIGH (ref 70–99)
Glucose-Capillary: 167 mg/dL — ABNORMAL HIGH (ref 70–99)

## 2020-05-03 MED ORDER — DEXAMETHASONE 4 MG PO TABS
10.0000 mg | ORAL_TABLET | Freq: Once | ORAL | Status: AC
  Administered 2020-05-03: 10 mg via ORAL
  Filled 2020-05-03: qty 3

## 2020-05-03 MED ORDER — DEXAMETHASONE 4 MG PO TABS
6.0000 mg | ORAL_TABLET | Freq: Four times a day (QID) | ORAL | Status: DC
  Administered 2020-05-03 – 2020-05-04 (×3): 6 mg via ORAL
  Filled 2020-05-03 (×3): qty 2

## 2020-05-03 NOTE — Progress Notes (Signed)
Occupational Therapy Treatment Patient Details Name: [name] [name] MRN: [id number] DOB: July 06, 1929 Today's Date: 05/03/2020    History of present illness pt is a 86 yo male who is s/p C3-6 posterior fusion. Pt has a PMH of CAD, HLD, HTN, T2DM.    OT comments  Patient supine in bed upon entry and agreeable to OT session.  Reports L sided weakness today, spoke to Dr. Ronnald Ramp who reports plan for MRI today but requested OT to continue to see patient.  Patient presenting with decreased proximal AROM/strength, distal strength affecting functional use during ADLs.  Continues to requires min guard for transfers and in room mobility using RW.  Believe if patient has 24/7 support at home he will benefit from [facility] services, otherwise recommend SNF rehab; as well as could change pending MRI results.  Will follow.    Follow Up Recommendations  Home health OT;SNF;Supervision/Assistance - 24 hour (pending progress)    Equipment Recommendations  None recommended by OT    Recommendations for Other Services      Precautions / Restrictions Precautions Precautions: Cervical;Fall Precaution Comments: reviewed precautions with patient, able to recall 3/3 Restrictions Weight Bearing Restrictions: No       Mobility Bed Mobility Overal bed mobility: Needs Assistance Bed Mobility: Rolling;Sidelying to Sit;Sit to Sidelying Rolling: Supervision Sidelying to sit: Supervision     Sit to sidelying: Supervision General bed mobility comments: good recall of log roll technique, supervision   Transfers Overall transfer level: Needs assistance Equipment used: Rolling walker (2 wheeled) Transfers: Sit to/from Stand Sit to Stand: Min guard         General transfer comment: for safety/balance, cueing for hand placement     Balance Overall balance assessment: Needs assistance Sitting-balance support: Feet supported;No upper extremity supported Sitting balance-Leahy Scale: Good     Standing  balance support: No upper extremity supported;During functional activity;Bilateral upper extremity supported Standing balance-Leahy Scale: Fair Standing balance comment: relies on UE support dynamically                           ADL either performed or assessed with clinical judgement   ADL Overall ADL's : Needs assistance/impaired     Grooming: Min guard;Standing Grooming Details (indicate cue type and reason): requires increased time due to decreased functional use of L UE, poor coordination and strength requires R UE to assist L UE functionally             Lower Body Dressing: Minimal assistance;Sit to/from stand Lower Body Dressing Details (indicate cue type and reason): requires assist due to decreased functional use of L UE  Toilet Transfer: Min guard;Ambulation;RW Toilet Transfer Details (indicate cue type and reason): simulated in room      Tub/ Shower Transfer: Tub transfer;Min guard;Ambulation Tub/Shower Transfer Details (indicate cue type and reason): simulated technique in room, min guard for safety with cueing for lateral stepping over threshold with UE support  Functional mobility during ADLs: Min guard;Rolling walker General ADL Comments: reviewed precautions and ADL compensatory techniques,  L UE exercises      Vision       Perception     Praxis      Cognition Arousal/Alertness: Awake/alert Behavior During Therapy: WFL for tasks assessed/performed Overall Cognitive Status: Within Functional Limits for tasks assessed  General Comments: pt with slight anxiety from increased L sided weakness         Exercises Exercises: General Upper Extremity;Other exercises General Exercises - Upper Extremity Shoulder Flexion: AAROM;Left;5 reps;Seated Shoulder Extension: AROM;Left;5 reps;Seated Elbow Flexion: AROM;5 reps;Left;Seated Elbow Extension: AROM;Left;5 reps;Seated Wrist Flexion: AROM;Left;5  reps;Seated Wrist Extension: AROM;Left;5 reps;Seated Digit Composite Flexion: AROM;Left;5 reps;Seated Composite Extension: AROM;Left;5 reps;Seated Other Exercises Other Exercises: AROM, L, 5 reps, seated supination/pronation    Shoulder Instructions       General Comments      Pertinent Vitals/ Pain       Pain Assessment: 0-10 Pain Score: 2  Pain Location: neck Pain Descriptors / Indicators: Operative site guarding;Guarding;Sore Pain Intervention(s): Limited activity within patient's tolerance;Monitored during session;Repositioned  Home Living                                          Prior Functioning/Environment              Frequency  Min 2X/week        Progress Toward Goals  OT Goals(current goals can now be found in the care plan section)  Progress towards OT goals: Not progressing toward goals - comment (decreased functional use of L UE )  Acute Rehab OT Goals Patient Stated Goal: to go to rehab  OT Goal Formulation: With patient  Plan Discharge plan remains appropriate;Frequency remains appropriate    Co-evaluation                 AM-PAC OT "6 Clicks" Daily Activity     Outcome Measure   Help from another person eating meals?: None Help from another person taking care of personal grooming?: A Little Help from another person toileting, which includes using toliet, bedpan, or urinal?: A Little Help from another person bathing (including washing, rinsing, drying)?: A Little Help from another person to put on and taking off regular upper body clothing?: A Little Help from another person to put on and taking off regular lower body clothing?: A Little 6 Click Score: 19    End of Session Equipment Utilized During Treatment: Rolling walker  OT Visit Diagnosis: Other abnormalities of gait and mobility (R26.89);Muscle weakness (generalized) (M62.81);Pain Pain - part of body:  (neck)   Activity Tolerance Patient tolerated treatment  well   Patient Left in bed;with call bell/phone within reach;Other (comment) (transport arrived)   Nurse Communication Mobility status        Time: [id number] OT Time Calculation (min): 14 min  Charges: OT General Charges $OT Visit: 1 Visit OT Treatments $Self Care/Home Management : 8-22 mins  Jolaine Artist, OT Acute Rehabilitation Services Pager [phone] Office [phone]    Delight Stare 05/03/2020, 9:47 AM

## 2020-05-03 NOTE — Progress Notes (Signed)
Physical Therapy Treatment Patient Details Name: [name] [name] MRN: [id number] DOB: 02-19-1929 Today's Date: 05/03/2020    History of Present Illness pt is a 86 yo male who is s/p C3-6 posterior fusion. Pt has a PMH of CAD, HLD, HTN, T2DM.     PT Comments    Pt with acute worsening LUE weakness and numbness (MD Jones aware and present). Session focused on seated postural re-education exercises and gait training. Pt mobilizing well, ambulating 400 feet with a walker at a supervision level. Will benefit from HHPT to address deficits and maximize functional mobility.     Follow Up Recommendations  Home health PT;Supervision for mobility/OOB     Equipment Recommendations  None recommended by PT    Recommendations for Other Services       Precautions / Restrictions Precautions Precautions: Cervical;Fall Precaution Comments: reviewed precautions with patient, able to recall 3/3 Restrictions Weight Bearing Restrictions: No    Mobility  Bed Mobility Overal bed mobility: Modified Independent             General bed mobility comments: ModI with transition from sit > supine  Transfers Overall transfer level: Needs assistance Equipment used: Rolling walker (2 wheeled) Transfers: Sit to/from Stand Sit to Stand: Supervision            Ambulation/Gait Ambulation/Gait assistance: Supervision Gait Distance (Feet): 400 Feet Assistive device: Rolling walker (2 wheeled) Gait Pattern/deviations: Step-through pattern;Decreased dorsiflexion - right;Decreased dorsiflexion - left;Decreased step length - left;Decreased step length - right Gait velocity: decreased   General Gait Details: Improved foot clearance, cues for heel strike at initial contact, noted decreased L pelvic rotation. No gross imbalance; able to adequately grip walker on L side   Stairs             Wheelchair Mobility    Modified Rankin (Stroke Patients Only)       Balance Overall balance  assessment: Needs assistance Sitting-balance support: Feet supported;No upper extremity supported Sitting balance-Leahy Scale: Good     Standing balance support: During functional activity;Bilateral upper extremity supported Standing balance-Leahy Scale: Poor                              Cognition Arousal/Alertness: Awake/alert Behavior During Therapy: WFL for tasks assessed/performed Overall Cognitive Status: Within Functional Limits for tasks assessed                                        Exercises Other Exercises Other Exercises: Seated: scapular retractions x 5, shoulder shrugs x 5    General Comments        Pertinent Vitals/Pain Pain Assessment: Faces Faces Pain Scale: Hurts a little bit Pain Location: neck Pain Descriptors / Indicators: Operative site guarding;Guarding;Sore Pain Intervention(s): Monitored during session    Home Living                      Prior Function            PT Goals (current goals can now be found in the care plan section) Acute Rehab PT Goals Patient Stated Goal: improved strength in LUE Potential to Achieve Goals: Good Progress towards PT goals: Progressing toward goals    Frequency    Min 5X/week      PT Plan Discharge plan needs to be updated    Co-evaluation  AM-PAC PT "6 Clicks" Mobility   Outcome Measure  Help needed turning from your back to your side while in a flat bed without using bedrails?: None Help needed moving from lying on your back to sitting on the side of a flat bed without using bedrails?: None Help needed moving to and from a bed to a chair (including a wheelchair)?: None Help needed standing up from a chair using your arms (e.g., wheelchair or bedside chair)?: None Help needed to walk in hospital room?: None Help needed climbing 3-5 steps with a railing? : A Little 6 Click Score: 23    End of Session Equipment Utilized During Treatment: Gait  belt Activity Tolerance: Patient tolerated treatment well Patient left: in bed;with call bell/phone within reach Nurse Communication: Mobility status PT Visit Diagnosis: Muscle weakness (generalized) (M62.81);Pain;Unsteadiness on feet (R26.81)     Time: 2956-2130 PT Time Calculation (min) (ACUTE ONLY): 14 min  Charges:  $Gait Training: 8-22 mins                      Wyona Almas, PT, DPT Acute Rehabilitation Services Pager [phone] Office [phone]    Deno Etienne 05/03/2020, 1:43 PM

## 2020-05-03 NOTE — Progress Notes (Signed)
Patient ID: [name] [name], male   DOB: December 18, 1928, 86 y.o.   MRN: [id number] MRI reviewed.  There is no compressive hematoma or other lesion.  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  This is typically self-limited and will improve with time.  The patient was reassured.  I think the strength in his wrist extensors is now better at 4 out of 5.  Hand grip remains 4 out of 5.  Deltoid remains 2 out of 5 in biceps 3 out of 5 is unchanged.  Right side and legs have good strength.  Patient would likely be discharged home tomorrow with home health

## 2020-05-03 NOTE — Progress Notes (Signed)
Patient ID: [name] [name], male   DOB: May 25, 1929, 86 y.o.   MRN: [id number] He states he woke up this morning with weakness in his left arm.  He does describe some numbness.  Numbness in his right hand is unchanged.  No weakness in the right hand.  He is walking better today than yesterday.  His gait is still somewhat spastic but "on lifting my foot better than yesterday."  Nurses confirm that his gait still looks better than yesterday.  He was walking down the hall with his walker more than 100 yards.  On exam on the left upper extremity, his left deltoid is 2 out of 5, bicep 3 out of 5, triceps 4 out of 5, grip 4 out of 5, wrist extensor 3 out of 5.  C5 nerve root palsy is not uncommon after large posterior cervical decompressions for severe stenosis though he does have some C6 root weakness also.  At this point, he does not look like he has spinal cord compression based on physical exam.  His epidural drain had only put out 10 cc this morning so it was removed.  We will start him on some Decadron.  I am going to get an MRI of the cervical spine though I think this will be difficult to interpret 2 days after large posterior cervical decompression, as there will be significant blood products from the surgery.  Hopefully, this is just a C5 nerve root palsy that will be self-limited and improve with time.  He is working with physical therapy now and just walked down the hall.  He does describe some sharp pain in the base of his neck but this is not unexpected given his 2 days out from a C3-C6 decompression and fusion.  We will order MRI stat and make further decisions based on the findings of the MRI.  I have told him this could be a simple C5 nerve root palsy from decompression and reexpansion, could be compressive from hematoma, could be inflammatory.

## 2020-05-03 NOTE — Progress Notes (Addendum)
Inpatient Rehab Admissions Coordinator:   Received new CIR consult.  Reviewed chart and discussed with PT.  Note pt with increased LUE weakness, but still only requiring supervision to min guard with OT for ADLs.  Supervision for ambulation x400' with PT.  Continue to recommend f/u at a lower level of care as pt does not require CIR level of intensity (at least 2 disciplines, 3 hrs/day).   Shann Medal, PT, DPT Admissions Coordinator [phone] 05/03/20  12:22 PM

## 2020-05-04 ENCOUNTER — Inpatient Hospital Stay (HOSPITAL_COMMUNITY)
Admission: RE | Admit: 2020-05-04 | Discharge: 2020-05-17 | DRG: 560 | Disposition: A | Discharge status: Home Health | Payer: Medicare Other | Source: Intra-hospital | Attending: Physical Medicine & Rehabilitation | Admitting: Physical Medicine & Rehabilitation

## 2020-05-04 ENCOUNTER — Encounter (HOSPITAL_COMMUNITY): Payer: Self-pay | Admitting: Physical Medicine & Rehabilitation

## 2020-05-04 ENCOUNTER — Other Ambulatory Visit: Payer: Self-pay

## 2020-05-04 DIAGNOSIS — M4712 Other spondylosis with myelopathy, cervical region: Secondary | ICD-10-CM | POA: Diagnosis present

## 2020-05-04 DIAGNOSIS — E871 Hypo-osmolality and hyponatremia: Secondary | ICD-10-CM | POA: Diagnosis present

## 2020-05-04 DIAGNOSIS — G992 Myelopathy in diseases classified elsewhere: Secondary | ICD-10-CM | POA: Diagnosis not present

## 2020-05-04 DIAGNOSIS — Z981 Arthrodesis status: Secondary | ICD-10-CM

## 2020-05-04 DIAGNOSIS — E46 Unspecified protein-calorie malnutrition: Secondary | ICD-10-CM | POA: Diagnosis present

## 2020-05-04 DIAGNOSIS — Z833 Family history of diabetes mellitus: Secondary | ICD-10-CM

## 2020-05-04 DIAGNOSIS — D62 Acute posthemorrhagic anemia: Secondary | ICD-10-CM | POA: Diagnosis present

## 2020-05-04 DIAGNOSIS — M4802 Spinal stenosis, cervical region: Secondary | ICD-10-CM

## 2020-05-04 DIAGNOSIS — E785 Hyperlipidemia, unspecified: Secondary | ICD-10-CM | POA: Diagnosis present

## 2020-05-04 DIAGNOSIS — I129 Hypertensive chronic kidney disease with stage 1 through stage 4 chronic kidney disease, or unspecified chronic kidney disease: Secondary | ICD-10-CM | POA: Diagnosis present

## 2020-05-04 DIAGNOSIS — G250 Essential tremor: Secondary | ICD-10-CM | POA: Diagnosis present

## 2020-05-04 DIAGNOSIS — R200 Anesthesia of skin: Secondary | ICD-10-CM | POA: Diagnosis present

## 2020-05-04 DIAGNOSIS — N179 Acute kidney failure, unspecified: Secondary | ICD-10-CM | POA: Diagnosis present

## 2020-05-04 DIAGNOSIS — D72829 Elevated white blood cell count, unspecified: Secondary | ICD-10-CM | POA: Diagnosis present

## 2020-05-04 DIAGNOSIS — E8809 Other disorders of plasma-protein metabolism, not elsewhere classified: Secondary | ICD-10-CM | POA: Diagnosis not present

## 2020-05-04 DIAGNOSIS — N189 Chronic kidney disease, unspecified: Secondary | ICD-10-CM | POA: Diagnosis present

## 2020-05-04 DIAGNOSIS — I2510 Atherosclerotic heart disease of native coronary artery without angina pectoris: Secondary | ICD-10-CM | POA: Diagnosis present

## 2020-05-04 DIAGNOSIS — Z951 Presence of aortocoronary bypass graft: Secondary | ICD-10-CM

## 2020-05-04 DIAGNOSIS — G588 Other specified mononeuropathies: Secondary | ICD-10-CM | POA: Diagnosis present

## 2020-05-04 DIAGNOSIS — R260 Ataxic gait: Secondary | ICD-10-CM | POA: Diagnosis present

## 2020-05-04 DIAGNOSIS — M109 Gout, unspecified: Secondary | ICD-10-CM | POA: Diagnosis present

## 2020-05-04 DIAGNOSIS — Z8249 Family history of ischemic heart disease and other diseases of the circulatory system: Secondary | ICD-10-CM

## 2020-05-04 DIAGNOSIS — K5903 Drug induced constipation: Secondary | ICD-10-CM | POA: Diagnosis present

## 2020-05-04 DIAGNOSIS — E1165 Type 2 diabetes mellitus with hyperglycemia: Secondary | ICD-10-CM | POA: Diagnosis not present

## 2020-05-04 DIAGNOSIS — G4700 Insomnia, unspecified: Secondary | ICD-10-CM | POA: Diagnosis present

## 2020-05-04 DIAGNOSIS — D72828 Other elevated white blood cell count: Secondary | ICD-10-CM | POA: Diagnosis not present

## 2020-05-04 DIAGNOSIS — T380X5A Adverse effect of glucocorticoids and synthetic analogues, initial encounter: Secondary | ICD-10-CM | POA: Diagnosis present

## 2020-05-04 DIAGNOSIS — Z4789 Encounter for other orthopedic aftercare: Principal | ICD-10-CM

## 2020-05-04 DIAGNOSIS — N400 Enlarged prostate without lower urinary tract symptoms: Secondary | ICD-10-CM | POA: Diagnosis present

## 2020-05-04 DIAGNOSIS — K219 Gastro-esophageal reflux disease without esophagitis: Secondary | ICD-10-CM | POA: Diagnosis present

## 2020-05-04 DIAGNOSIS — R7401 Elevation of levels of liver transaminase levels: Secondary | ICD-10-CM | POA: Diagnosis present

## 2020-05-04 DIAGNOSIS — E1122 Type 2 diabetes mellitus with diabetic chronic kidney disease: Secondary | ICD-10-CM | POA: Diagnosis present

## 2020-05-04 DIAGNOSIS — N1832 Chronic kidney disease, stage 3b: Secondary | ICD-10-CM

## 2020-05-04 DIAGNOSIS — R062 Wheezing: Secondary | ICD-10-CM | POA: Diagnosis not present

## 2020-05-04 DIAGNOSIS — R739 Hyperglycemia, unspecified: Secondary | ICD-10-CM | POA: Diagnosis not present

## 2020-05-04 DIAGNOSIS — Z8546 Personal history of malignant neoplasm of prostate: Secondary | ICD-10-CM

## 2020-05-04 DIAGNOSIS — D649 Anemia, unspecified: Secondary | ICD-10-CM | POA: Diagnosis not present

## 2020-05-04 DIAGNOSIS — I10 Essential (primary) hypertension: Secondary | ICD-10-CM | POA: Diagnosis not present

## 2020-05-04 LAB — GLUCOSE, CAPILLARY
Glucose-Capillary: 182 mg/dL — ABNORMAL HIGH (ref 70–99)
Glucose-Capillary: 175 mg/dL — ABNORMAL HIGH (ref 70–99)
Glucose-Capillary: 174 mg/dL — ABNORMAL HIGH (ref 70–99)
Glucose-Capillary: 200 mg/dL — ABNORMAL HIGH (ref 70–99)

## 2020-05-04 MED ORDER — DIPHENHYDRAMINE HCL 12.5 MG/5ML PO ELIX: 12.5000 mg | ORAL_SOLUTION | Freq: Four times a day (QID) | ORAL | Status: DC | PRN

## 2020-05-04 MED ORDER — INSULIN ASPART 100 UNIT/ML ~~LOC~~ SOLN
0.0000 [IU] | Freq: Three times a day (TID) | SUBCUTANEOUS | Status: DC
  Administered 2020-05-04 – 2020-05-05 (×4): 2 [IU] via SUBCUTANEOUS
  Administered 2020-05-06: 1 [IU] via SUBCUTANEOUS
  Administered 2020-05-06 – 2020-05-08 (×8): 2 [IU] via SUBCUTANEOUS
  Administered 2020-05-09: 1 [IU] via SUBCUTANEOUS
  Administered 2020-05-09 – 2020-05-10 (×3): 2 [IU] via SUBCUTANEOUS
  Administered 2020-05-10: 1 [IU] via SUBCUTANEOUS
  Administered 2020-05-10: 2 [IU] via SUBCUTANEOUS
  Administered 2020-05-11 – 2020-05-13 (×7): 1 [IU] via SUBCUTANEOUS
  Administered 2020-05-13: 3 [IU] via SUBCUTANEOUS
  Administered 2020-05-13 – 2020-05-15 (×4): 1 [IU] via SUBCUTANEOUS
  Administered 2020-05-15: 2 [IU] via SUBCUTANEOUS
  Administered 2020-05-16 – 2020-05-17 (×3): 1 [IU] via SUBCUTANEOUS

## 2020-05-04 MED ORDER — ACETAMINOPHEN 325 MG PO TABS
325.0000 mg | ORAL_TABLET | ORAL | Status: DC | PRN
  Administered 2020-05-05 – 2020-05-12 (×5): 650 mg via ORAL
  Filled 2020-05-04 (×5): qty 2

## 2020-05-04 MED ORDER — PROCHLORPERAZINE 25 MG RE SUPP: 12.5000 mg | Freq: Four times a day (QID) | RECTAL | Status: DC | PRN

## 2020-05-04 MED ORDER — PANTOPRAZOLE SODIUM 40 MG PO TBEC
40.0000 mg | DELAYED_RELEASE_TABLET | Freq: Every day | ORAL | Status: DC
  Administered 2020-05-04 – 2020-05-17 (×14): 40 mg via ORAL
  Filled 2020-05-04 (×14): qty 1

## 2020-05-04 MED ORDER — ALLOPURINOL 300 MG PO TABS
300.0000 mg | ORAL_TABLET | Freq: Every day | ORAL | Status: DC
  Administered 2020-05-05 – 2020-05-17 (×13): 300 mg via ORAL
  Filled 2020-05-04 (×13): qty 1

## 2020-05-04 MED ORDER — BISACODYL 10 MG RE SUPP: 10.0000 mg | Freq: Every day | RECTAL | Status: DC | PRN

## 2020-05-04 MED ORDER — TRAMADOL HCL 50 MG PO TABS
50.0000 mg | ORAL_TABLET | Freq: Four times a day (QID) | ORAL | Status: DC | PRN
  Administered 2020-05-08 – 2020-05-10 (×5): 50 mg via ORAL
  Filled 2020-05-04 (×6): qty 1

## 2020-05-04 MED ORDER — SORBITOL 70 % SOLN
30.0000 mL | Freq: Once | Status: AC
  Administered 2020-05-04: 30 mL via ORAL
  Filled 2020-05-04: qty 30

## 2020-05-04 MED ORDER — INSULIN ASPART 100 UNIT/ML ~~LOC~~ SOLN
0.0000 [IU] | Freq: Every day | SUBCUTANEOUS | Status: DC
  Administered 2020-05-08: 2 [IU] via SUBCUTANEOUS

## 2020-05-04 MED ORDER — PHENOL 1.4 % MT LIQD
1.0000 | OROMUCOSAL | Status: DC | PRN
  Filled 2020-05-04: qty 177

## 2020-05-04 MED ORDER — TAMSULOSIN HCL 0.4 MG PO CAPS
0.4000 mg | ORAL_CAPSULE | Freq: Every day | ORAL | Status: DC
  Administered 2020-05-05 – 2020-05-06 (×2): 0.4 mg via ORAL
  Filled 2020-05-04 (×2): qty 1

## 2020-05-04 MED ORDER — ONDANSETRON HCL 4 MG/2ML IJ SOLN: 4.0000 mg | Freq: Four times a day (QID) | INTRAMUSCULAR | Status: DC | PRN

## 2020-05-04 MED ORDER — LISINOPRIL 20 MG PO TABS
20.0000 mg | ORAL_TABLET | Freq: Every day | ORAL | Status: DC
  Administered 2020-05-05 – 2020-05-17 (×13): 20 mg via ORAL
  Filled 2020-05-04 (×13): qty 1

## 2020-05-04 MED ORDER — CARVEDILOL PHOSPHATE ER 20 MG PO CP24
20.0000 mg | ORAL_CAPSULE | Freq: Every day | ORAL | Status: DC
  Administered 2020-05-05 – 2020-05-17 (×13): 20 mg via ORAL
  Filled 2020-05-04 (×13): qty 1

## 2020-05-04 MED ORDER — ONDANSETRON HCL 4 MG PO TABS: 4.0000 mg | ORAL_TABLET | Freq: Four times a day (QID) | ORAL | Status: DC | PRN

## 2020-05-04 MED ORDER — HYDROCODONE-ACETAMINOPHEN 5-325 MG PO TABS
1.0000 | ORAL_TABLET | ORAL | Status: DC | PRN
  Administered 2020-05-05 – 2020-05-17 (×31): 1 via ORAL
  Filled 2020-05-04 (×32): qty 1

## 2020-05-04 MED ORDER — DEXAMETHASONE 4 MG PO TABS
4.0000 mg | ORAL_TABLET | Freq: Four times a day (QID) | ORAL | Status: DC
  Administered 2020-05-04 – 2020-05-06 (×7): 4 mg via ORAL
  Filled 2020-05-04 (×7): qty 1

## 2020-05-04 MED ORDER — SENNA 8.6 MG PO TABS
1.0000 | ORAL_TABLET | Freq: Two times a day (BID) | ORAL | Status: DC
  Administered 2020-05-04 – 2020-05-05 (×2): 8.6 mg via ORAL
  Filled 2020-05-04 (×2): qty 1

## 2020-05-04 MED ORDER — POLYETHYLENE GLYCOL 3350 17 G PO PACK
17.0000 g | PACK | Freq: Every day | ORAL | Status: DC
  Administered 2020-05-04 – 2020-05-05 (×2): 17 g via ORAL
  Filled 2020-05-04 (×2): qty 1

## 2020-05-04 MED ORDER — MENTHOL 3 MG MT LOZG: 1.0000 | LOZENGE | OROMUCOSAL | Status: DC | PRN

## 2020-05-04 MED ORDER — TRAZODONE HCL 50 MG PO TABS
25.0000 mg | ORAL_TABLET | Freq: Every evening | ORAL | Status: DC | PRN
  Administered 2020-05-13 – 2020-05-14 (×2): 50 mg via ORAL
  Filled 2020-05-04 (×3): qty 1

## 2020-05-04 MED ORDER — GUAIFENESIN-DM 100-10 MG/5ML PO SYRP: 5.0000 mL | ORAL_SOLUTION | Freq: Four times a day (QID) | ORAL | Status: DC | PRN

## 2020-05-04 MED ORDER — ALUM & MAG HYDROXIDE-SIMETH 200-200-20 MG/5ML PO SUSP: 30.0000 mL | ORAL | Status: DC | PRN

## 2020-05-04 MED ORDER — ATORVASTATIN CALCIUM 40 MG PO TABS
40.0000 mg | ORAL_TABLET | Freq: Every day | ORAL | Status: DC
  Administered 2020-05-04 – 2020-05-17 (×14): 40 mg via ORAL
  Filled 2020-05-04 (×14): qty 1

## 2020-05-04 MED ORDER — ALBUTEROL SULFATE (2.5 MG/3ML) 0.083% IN NEBU
2.5000 mg | INHALATION_SOLUTION | Freq: Four times a day (QID) | RESPIRATORY_TRACT | Status: DC | PRN
  Administered 2020-05-05: 2.5 mg via RESPIRATORY_TRACT
  Filled 2020-05-04: qty 3

## 2020-05-04 MED ORDER — PROCHLORPERAZINE MALEATE 5 MG PO TABS: 5.0000 mg | ORAL_TABLET | Freq: Four times a day (QID) | ORAL | Status: DC | PRN

## 2020-05-04 MED ORDER — DEXAMETHASONE 4 MG PO TABS
4.0000 mg | ORAL_TABLET | Freq: Four times a day (QID) | ORAL | Status: DC
  Administered 2020-05-04: 4 mg via ORAL
  Filled 2020-05-04: qty 1

## 2020-05-04 MED ORDER — FLEET ENEMA 7-19 GM/118ML RE ENEM: 1.0000 | ENEMA | Freq: Once | RECTAL | Status: DC | PRN

## 2020-05-04 MED ORDER — FLEET ENEMA 7-19 GM/118ML RE ENEM
1.0000 | ENEMA | Freq: Once | RECTAL | Status: AC
  Administered 2020-05-04: 1 via RECTAL
  Filled 2020-05-04: qty 1

## 2020-05-04 MED ORDER — PRIMIDONE 50 MG PO TABS
25.0000 mg | ORAL_TABLET | Freq: Every day | ORAL | Status: DC
  Administered 2020-05-05 – 2020-05-17 (×13): 25 mg via ORAL
  Filled 2020-05-04 (×13): qty 0.5

## 2020-05-04 MED ORDER — OXYCODONE HCL 5 MG PO TABS: 5.0000 mg | ORAL_TABLET | ORAL | Status: DC | PRN

## 2020-05-04 MED ORDER — PROCHLORPERAZINE EDISYLATE 10 MG/2ML IJ SOLN: 5.0000 mg | Freq: Four times a day (QID) | INTRAMUSCULAR | Status: DC | PRN

## 2020-05-04 MED ORDER — POLYETHYLENE GLYCOL 3350 17 G PO PACK: 17.0000 g | PACK | Freq: Every day | ORAL | Status: DC | PRN

## 2020-05-04 NOTE — Progress Notes (Signed)
PMR Admission Coordinator Pre-Admission Assessment   Patient: LENNIE VASCO is an 86 y.o., male MRN: [id number] DOB: September 15, 1928 Height: _0  (180.3 cm) Weight: 83.9 kg   Insurance Information HMO:     PPO:      PCP:      IPA:      80/20:      OTHER:  PRIMARY: Medicare A and B      Policy#: [id number]      Subscriber: pt CM Name:       Phone#:      Fax#:  Pre-Cert#: verified Civil engineer, contracting:  Benefits:  Phone #:      Name:  Eff. Date: A 01/08/96, B 03/09/01     Deduct: $1484      Out of Pocket Max: n/a      Life Max: n/a CIR: 100%      SNF: 20 full days Outpatient: 80%     Co-Pay: 20% Home Health: 100%      Co-Pay:  DME: 80%     Co-Pay: 20% Providers:  SECONDARY: Tricare for Life      Policy#: [id number]     Phone#: [phone]   Financial Counselor:       Phone#:    The "Data Collection Information Summary" for patients in Inpatient Rehabilitation Facilities with attached "Privacy Act [place] Records" was provided and verbally reviewed with: Patient and Family   Emergency Contact Information         Contact Information     Name Relation Home Work Mobile    Morley Daughter     [phone]         Current Medical History  Patient Admitting Diagnosis: cervical myelopathy s/p posterior cervical fusion   History of Present Illness: Pt is an 86 y/o male admitted for surgical management of myelopathy (+ gait disturbance and numbness in B hands) on 05/01/2020 to [facility].  PMH significant for CAD s/p CABG, HTN, prostate cancer, and DM.  Post op course complicated by worsening LUE weakness.  Post-op scans completed and revealed no compressive hematoma or lesion.  Dr. 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  Therapy recommending CIR due to ongoing LUE weakness which impairs pt's ability to mobilize safely with an AD.     Patient's medical record from [facility] has been reviewed by the rehabilitation  admission coordinator and physician.   Past Medical History      Past Medical History:  Diagnosis Date  . Acute renal failure ([place]) 12/06/2013  . Back pain      occasionally  . Benign essential tremor 2005    takes Primidone daily  . Choledocholithiasis 12/06/2013  . Constipation      takes OTC stool softener  . Coronary artery disease 2007    s/p CABG, DR Johnsie Cancel  . GERD (gastroesophageal reflux disease)      takes Nexium daily  . Gout 1995    "~ once/yr" (03/17/2014)  . History of colon polyps    . Hyperlipidemia 12/2005    takes Atorvastatin daily  . Hypertension 1995    takes Lisinopril and Coreg daily  . possible HCAP (healthcare-associated pneumonia) 12/09/2013  . Prostate cancer ([place]) 2007    S/P treatment, followed by Urology prev  . Right bundle branch block (RBBB)    . Stenosis of cervical spine    . Type II diabetes mellitus ([place])  no meds;diet and exercise controlled   . Wears dentures    . Wears glasses        Family History   family history includes Cancer in his brother, mother, and sister; Diabetes in his sister; Heart disease in his mother; Hypothyroidism in his brother; Prostate cancer in his brother.   Prior Rehab/Hospitalizations Has the patient had prior rehab or hospitalizations prior to admission? No   Has the patient had major surgery during 100 days prior to admission? Yes              Current Medications   Current Facility-Administered Medications:  .  0.9 %  sodium chloride infusion, 250 mL, Intravenous, Continuous, Eustace Moore, MD, Last Rate: 1 mL/hr at 05/01/20 1340, 250 mL at 05/01/20 1340 .  0.9 % NaCl with KCl 20 mEq/ L  infusion, , Intravenous, Continuous, Eustace Moore, MD .  acetaminophen (TYLENOL) tablet 650 mg, 650 mg, Oral, Q4H PRN, 650 mg at 05/04/20 1024 **OR** acetaminophen (TYLENOL) suppository 650 mg, 650 mg, Rectal, Q4H PRN, Eustace Moore, MD .  allopurinol (ZYLOPRIM) tablet 300 mg, 300 mg, Oral, Daily, Eustace Moore,  MD, 300 mg at 05/04/20 1027 .  alum & mag hydroxide-simeth (MAALOX/MYLANTA) 200-200-20 MG/5ML suspension 30 mL, 30 mL, Oral, Q6H PRN, Eustace Moore, MD .  carvedilol (COREG CR) 24 hr capsule 20 mg, 20 mg, Oral, Daily, Eustace Moore, MD, 20 mg at 05/04/20 1027 .  dexamethasone (DECADRON) tablet 4 mg, 4 mg, Oral, Q6H, Eustace Moore, MD .  lisinopril (ZESTRIL) tablet 20 mg, 20 mg, Oral, Daily, Eustace Moore, MD, 20 mg at 05/04/20 1027 .  menthol-cetylpyridinium (CEPACOL) lozenge 3 mg, 1 lozenge, Oral, PRN **OR** phenol (CHLORASEPTIC) mouth spray 1 spray, 1 spray, Mouth/Throat, PRN, Eustace Moore, MD .  morphine 2 MG/ML injection 2 mg, 2 mg, Intravenous, Q2H PRN, Eustace Moore, MD .  ondansetron [facility]) tablet 4 mg, 4 mg, Oral, Q6H PRN **OR** ondansetron (ZOFRAN) injection 4 mg, 4 mg, Intravenous, Q6H PRN, Eustace Moore, MD .  oxyCODONE (Oxy IR/ROXICODONE) immediate release tablet 5-10 mg, 5-10 mg, Oral, Q3H PRN, Eustace Moore, MD, 5 mg at 05/04/20 0456 .  primidone (MYSOLINE) tablet 25 mg, 25 mg, Oral, Daily, Eustace Moore, MD, 25 mg at 05/04/20 1027 .  senna (SENOKOT) tablet 8.6 mg, 1 tablet, Oral, BID, Eustace Moore, MD, 8.6 mg at 05/04/20 1027 .  sodium chloride flush (NS) 0.9 % injection 3 mL, 3 mL, Intravenous, Q12H, Eustace Moore, MD, 3 mL at 05/04/20 1028 .  sodium chloride flush (NS) 0.9 % injection 3 mL, 3 mL, Intravenous, PRN, Eustace Moore, MD .  tamsulosin [facility]) capsule 0.4 mg, 0.4 mg, Oral, Daily, Eustace Moore, MD, 0.4 mg at 05/04/20 1027   Patients Current Diet:     Diet Order                      Diet Carb Modified Fluid consistency: Thin; Room service appropriate? Yes  Diet effective now                      Precautions / Restrictions Precautions Precautions: Cervical, Fall Precaution Booklet Issued: No Precaution Comments: reviewed precautions with patient, able to recall 3/3 Restrictions Weight Bearing Restrictions: No    Has the patient had 2 or  more falls or a fall with injury in the past year? No  Prior Activity Level Limited Community (1-2x/wk): using a small based quad cane prior to admit, not driving   Prior Functional Level Self Care: Did the patient need help bathing, dressing, using the toilet or eating? Independent   Indoor Mobility: Did the patient need assistance with walking from room to room (with or without device)? Independent   Stairs: Did the patient need assistance with internal or external stairs (with or without device)? Independent   Functional Cognition: Did the patient need help planning regular tasks such as shopping or remembering to take medications? [place] / Equipment Home Assistive Devices/Equipment: Eyeglasses, Dentures (specify type), Cane (specify quad or straight), Walker (specify type) Home Equipment: Grab bars - toilet, Shower seat, Environmental consultant - 2 wheels, Cane - quad   Prior Device Use: Indicate devices/aids used by the patient prior to current illness, exacerbation or injury? quad cane   Current Functional Level Cognition   Overall Cognitive Status: Within Functional Limits for tasks assessed Orientation Level: Oriented X4 General Comments: pt with slight anxiety from increased L sided weakness     Extremity Assessment (includes Sensation/Coordination)   Upper Extremity Assessment: LUE deficits/detail LUE Deficits / Details: decreased functional use of L UE; shoulder flexion and abduction 2-/5, elbow flexion 3-/5, supination 3-/5 and wrist/hand 3+/5  LUE Coordination: decreased fine motor, decreased gross motor  Lower Extremity Assessment: Defer to PT evaluation     ADLs   Overall ADL's : Needs assistance/impaired Grooming: Minimal assistance, Standing Grooming Details (indicate cue type and reason): min assist today for mgmt of L UE, decreased elbow flexion today requiring incraesed assist  Upper Body Bathing: Set up, Sitting Lower Body Bathing: Sit to/from  stand, Min guard Upper Body Dressing : Minimal assistance, Sitting, Cueing for compensatory techniques Upper Body Dressing Details (indicate cue type and reason): threading L UE first, requires assist to bring 2nd gown around; increased time required  Lower Body Dressing: Minimal assistance, Sit to/from stand Lower Body Dressing Details (indicate cue type and reason): assist to adjust socks and min guard in static standing  Toilet Transfer: Min guard, Ambulation, RW Toilet Transfer Details (indicate cue type and reason): simulated in room Tub/ Shower Transfer: Tub transfer, Min guard, Ambulation Tub/Shower Transfer Details (indicate cue type and reason): simulated technique in room, min guard for safety with cueing for lateral stepping over threshold with UE support  Functional mobility during ADLs: Min guard, Rolling walker, Cueing for sequencing General ADL Comments: pt limited by decreased functional use of L UE, imapired balance and safety; functional mobility with RW and cueing required for R sided preference to maintain midline      Mobility   Overal bed mobility: Needs Assistance Bed Mobility: Rolling, Sidelying to Sit Rolling: Supervision Sidelying to sit: Supervision Sit to sidelying: Supervision General bed mobility comments: Pt was received sitting up in the recliner chair.      Transfers   Overall transfer level: Needs assistance Equipment used: Quad cane Transfers: Sit to/from Stand Sit to Stand: Min guard General transfer comment: Hands-on guarding for power up to full standing position.      Ambulation / Gait / Stairs / Wheelchair Mobility   Ambulation/Gait Ambulation/Gait assistance: Min assist, Mod assist Gait Distance (Feet): 200 Feet Assistive device: Quad cane Gait Pattern/deviations: Step-through pattern, Decreased dorsiflexion - right, Decreased dorsiflexion - left, Decreased step length - left, Decreased step length - right, Staggering right General Gait  Details: With cane, pt required consistent min assist for balance support as  pt frequently with right lateral instability and loss of balance. 1 instance of significant loss of balance requiring light mod assist to recover. VC's for proper QC use.  Gait velocity: decreased Gait velocity interpretation: <1.31 ft/sec, indicative of household ambulator Stairs: Yes Stairs assistance: Min assist Stair Management: One rail Right Number of Stairs: 3 General stair comments: 1 step x3 attempts. Unsteady and with increased pull with RUE on rail to advance. VC's for minimal UE pull on rail to protect cervical spine.      Posture / Balance Dynamic Sitting Balance Sitting balance - Comments: pt able to sit EOB without UE support Balance Overall balance assessment: Needs assistance Sitting-balance support: No upper extremity supported, Feet supported Sitting balance-Leahy Scale: Fair Sitting balance - Comments: pt able to sit EOB without UE support Standing balance support: Single extremity supported, During functional activity Standing balance-Leahy Scale: Poor Standing balance comment: Without BUE support pt requires assistance     Special needs/care consideration Skin posterior cervical incision, Diabetic management yes and Designated visitor daughter, [name]    Previous Home Environment (from acute therapy documentation) Living Arrangements: Children, Other relatives Available Help at Discharge: Family, Available PRN/intermittently Type of Home: House Home Layout: One level Home Access: Stairs to enter Entrance Stairs-Rails: None Entrance Stairs-Number of Steps: 3 Bathroom Shower/Tub: Optometrist: Yes Home Care Services: No   Discharge Living Setting Plans for Discharge Living Setting: Lives with (comment) (daughter) Type of Home at Discharge: House Discharge Home Layout: One level Discharge Home Access: Stairs to enter Entrance  Stairs-Rails: None Entrance Stairs-Number of Steps: 3 Discharge Bathroom Shower/Tub: Tub/shower unit, Walk-in shower Discharge Bathroom Toilet: Standard Discharge Bathroom Accessibility: Yes How Accessible: Accessible via walker Does the patient have any problems obtaining your medications?: No   Social/Family/Support Systems Anticipated Caregiver: daughter, Ardyth Gal  Anticipated Caregiver's Contact Information: [phone] Ability/Limitations of Caregiver: works as a Pharmacist, hospital, not available during the days Caregiver Availability: Evenings only Discharge Plan Discussed with Primary Caregiver: Yes Is Caregiver In Agreement with Plan?: Yes Does Caregiver/Family have Issues with Lodging/Transportation while Pt is in Rehab?: No   Goals Patient/Family Goal for Rehab: PT/OT mod I Expected length of stay: 6-9 days Pt/Family Agrees to Admission and willing to participate: Yes Program Orientation Provided & Reviewed with Pt/Caregiver Including Roles  & Responsibilities: Yes  Barriers to Discharge: Decreased caregiver support, Inaccessible home environment   Decrease burden of Care through IP rehab admission: n/a   Possible need for SNF placement upon discharge:  Not anticipated   Patient Condition: I have reviewed medical records from [facility], spoken with CSW, and patient. I met with patient at the bedside for inpatient rehabilitation assessment.  Patient will benefit from ongoing PT and OT, can actively participate in 3 hours of therapy a day 5 days of the week, and can make measurable gains during the admission.  Patient will also benefit from the coordinated team approach during an Inpatient Acute Rehabilitation admission.  The patient will receive intensive therapy as well as Rehabilitation physician, nursing, social worker, and care management interventions.  Due to safety, skin/wound care, disease management, pain management and patient education the patient requires 24 hour  a day rehabilitation nursing.  The patient is currently min to mod assist with mobility and basic ADLs.  Discharge setting and therapy post discharge at home with home health is anticipated.  Patient has agreed to participate in the Acute Inpatient Rehabilitation Program and will admit today.   Preadmission  Screen Completed By:  Michel Santee, PT, DPT 05/04/2020 12:15 PM ______________________________________________________________________   Discussed status with Dr. Dagoberto Ligas on 05/04/20 at 12:23 PM and received approval for admission today.   Admission Coordinator:  Michel Santee, PT, DPT time 12:24 PM Sudie Grumbling 05/04/20     Assessment/Plan: Diagnosis: 1. Does the need for close, 24 hr/day Medical supervision in concert with the patient's rehab needs make it unreasonable for this patient to be served in a less intensive setting? Yes 2. Co-Morbidities requiring supervision/potential complications: DM, HTN, HLD, CAD, benign essential tremor, C5 nerve root palsy 3. Due to bowel management, safety, skin/wound care, disease management, medication administration, pain management and patient education, does the patient require 24 hr/day rehab nursing? Yes 4. Does the patient require coordinated care of a physician, rehab nurse, PT, OT, and SLP to address physical and functional deficits in the context of the above medical diagnosis(es)? Yes Addressing deficits in the following areas: balance, endurance, locomotion, strength, transferring, bathing, dressing, feeding, grooming and toileting 5. Can the patient actively participate in an intensive therapy program of at least 3 hrs of therapy 5 days a week? Yes 6. The potential for patient to make measurable gains while on inpatient rehab is excellent 7. Anticipated functional outcomes upon discharge from inpatient rehab: modified independent PT, modified independent OT, n/a SLP 8. Estimated rehab length of stay to reach the above functional goals is: 6-9  days 9. Anticipated discharge destination: Home 10. Overall Rehab/Functional Prognosis: excellent     MD Signature:

## 2020-05-04 NOTE — Progress Notes (Addendum)
Inpatient Rehab Admissions Coordinator:   Asked by therapy to re-evaluate given ongoing decline in LUE strength and affects on mobility.  Reviewed chart and discussed with rehab MD, Dr. Naaman Plummer, who agrees that pt would benefit from a short stay on CIR.  Will meet with pt at bedside shortly to discuss goals/expectations of CIR admit.   Addendum: Met with pt at bedside who is agreeable to short term rehab on CIR.  I can admit pt today, Margo Aye, NP, in agreement.  Will let pt/family and TOC team know.   Shann Medal, PT, DPT Admissions Coordinator [phone] 05/04/20  11:36 AM

## 2020-05-04 NOTE — TOC Progression Note (Signed)
Transition of Care [facility]) - Progression Note    Patient Details  Name: [name] [name] MRN: [id number] Date of Birth: February 28, 1929  Transition of Care [facility]) CM/SW [place], LCSW Phone Number: 05/04/2020, 9:38 AM  Clinical Narrative:    Patient agreeable to [facility] ([facility] does not have staffing for [place] area). Alvis Lemmings accepted patient for PT/OT/aide.    Expected Discharge Plan: [place] Barriers to Discharge: Continued Medical Work up  Expected Discharge Plan and Services Expected Discharge Plan: [place] In-house Referral: Clinical Social Work Discharge Planning Services: CM Consult Post Acute Care Choice: [place] arrangements for the past 2 months: Single Family Home                           HH Arranged: PT, OT, Nurse's Aide [place] Agency: [place] Date [facility] Agency Contacted: 05/04/20 Time [place]: [phone] Representative spoke with at [place]: [place] Determinants of Health ([place]) Interventions    Readmission Risk Interventions No flowsheet data found.

## 2020-05-04 NOTE — Progress Notes (Signed)
Inpatient Rehabilitation Medication Review by a Pharmacist  A complete drug regimen review was completed for this patient to identify any potential clinically significant medication issues.  Clinically significant medication issues were identified:  yes   Type of Medication Issue Identified Description of Issue Urgent (address now) Non-Urgent (address on AM team rounds) Plan   Drug Interaction(s) (clinically significant)       Duplicate Therapy       Allergy       No Medication Administration End Date       Incorrect Dose  PTA Aspirin and atorvastatin resumed by NSG on discharge, not ordered   Non-urgent Hold aspirin per rehab team Ok to resume atorvastatin per team   Additional Drug Therapy Needed  Was on omeprazole at home, now also on dexamethasone and needs GI ppx Non-urgent OK to restart pantoprazole 40mg  daily per team   Other         Name of provider notified for urgent issues identified:   Provider Method of Notification:    For non-urgent medication issues to be resolved on team rounds tomorrow morning a [facility] Secure Chat Handoff was sent to: Algis Liming, PA      Time spent performing this drug regimen review (minutes):  [place], PharmD, [place], [facility] Clinical Pharmacist  Please check AMION for all [place] phone numbers After 10:00 PM, call [place]

## 2020-05-04 NOTE — Plan of Care (Signed)
Patient alert and oriented. Adequately ready for rehab. Dressing CDI with no s/s of infection on incision area. All belongings with patient at the time of discharged.

## 2020-05-04 NOTE — Discharge Summary (Signed)
Physician Discharge Summary  Patient ID: [name] [name] MRN: [id number] DOB/AGE: 1929-06-18 86 y.o.  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 The patient tolerated the procedure well and was taken to the recovery room and then to the  floor in stable condition. The hospital course was routine the 1st 24 hours.  He awoke with C5 nerve root dysfunction on the 2nd postoperative day.  MRI of the cervical spine showed no compressive hematoma.  I explained to the patient the C5 nerve root dyspraxia is not uncommon after her decompression for severe spinal stenosis and explained the etiology of this. The wound remained clean dry and intact. Pt had appropriate   Neck soreness.  The patient remained afebrile with stable vital signs, and tolerated a regular diet. The patient continued to increase activities, and pain was well controlled with oral pain medications.  He continued to do well with Physical and Occupational therapy but given his gait disturbance and new C5 nerve root dysfunction we felt he was best served by short stay an rehabilitation  Consults: rehabilitation medicine  Significant Diagnostic Studies:  Results for orders placed or performed during the hospital encounter of 05/01/20  Surgical pcr screen   Specimen: Nasal Mucosa; Nasal Swab  Result Value Ref Range   MRSA, PCR NEGATIVE NEGATIVE   Staphylococcus aureus NEGATIVE NEGATIVE  Glucose, capillary  Result Value Ref Range   Glucose-Capillary 150 (H) 70 - 99 mg/dL  Basic metabolic panel  Result Value Ref Range   Sodium 136 135 - 145 mmol/L   Potassium  4.4 3.5 - 5.1 mmol/L   Chloride 104 98 - 111 mmol/L   CO2 22 22 - 32 mmol/L   Glucose, Bld 149 (H) 70 - 99 mg/dL   BUN 39 (H) 8 - 23 mg/dL   Creatinine, Ser 1.97 (H) 0.61 - 1.24 mg/dL   Calcium 9.7 8.9 - 10.3 mg/dL   GFR calc non Af Amer 29 (L) >60 mL/min   GFR calc Af Amer 34 (L) >60 mL/min   Anion gap 10 5 - 15  CBC WITH DIFFERENTIAL  Result Value Ref Range   WBC 6.2 4.0 - 10.5 K/uL   RBC 3.97 (L) 4.22 - 5.81 MIL/uL   Hemoglobin 12.5 (L) 13.0 - 17.0 g/dL   HCT 38.3 (L) 39 - 52 %   MCV 96.5 80.0 - 100.0 fL   MCH 31.5 26.0 - 34.0 pg   MCHC 32.6 30.0 - 36.0 g/dL   RDW 15.4 11.5 - 15.5 %   Platelets 176 150 - 400 K/uL   nRBC 0.0 0.0 - 0.2 %   Neutrophils Relative % 49 %   Neutro Abs 3.1 1.7 - 7.7 K/uL   Lymphocytes Relative 33 %   Lymphs Abs 2.0 0.7 - 4.0 K/uL   Monocytes Relative 10 %   Monocytes Absolute 0.6 0 - 1 K/uL   Eosinophils Relative 6 %   Eosinophils Absolute 0.4 0 - 0 K/uL   Basophils Relative 1 %   Basophils Absolute 0.0 0 - 0 K/uL   Immature Granulocytes 1 %   Abs Immature Granulocytes 0.06 0.00 - 0.07 K/uL  Protime-INR  Result Value Ref  Range   Prothrombin Time 13.2 11.4 - 15.2 seconds   INR 1.0 0.8 - 1.2  Glucose, capillary  Result Value Ref Range   Glucose-Capillary 174 (H) 70 - 99 mg/dL  Glucose, capillary  Result Value Ref Range   Glucose-Capillary 227 (H) 70 - 99 mg/dL  Glucose, capillary  Result Value Ref Range   Glucose-Capillary 240 (H) 70 - 99 mg/dL   Comment 1 Notify RN    Comment 2 Document in Chart   Glucose, capillary  Result Value Ref Range   Glucose-Capillary 184 (H) 70 - 99 mg/dL   Comment 1 Notify RN    Comment 2 Document in Chart   Glucose, capillary  Result Value Ref Range   Glucose-Capillary 188 (H) 70 - 99 mg/dL  Glucose, capillary  Result Value Ref Range   Glucose-Capillary 161 (H) 70 - 99 mg/dL  Glucose, capillary  Result Value Ref Range   Glucose-Capillary 153 (H) 70 - 99 mg/dL   Comment 1 Notify RN    Comment 2  Document in Chart   Glucose, capillary  Result Value Ref Range   Glucose-Capillary 178 (H) 70 - 99 mg/dL   Comment 1 Notify RN    Comment 2 Document in Chart   Glucose, capillary  Result Value Ref Range   Glucose-Capillary 171 (H) 70 - 99 mg/dL  Glucose, capillary  Result Value Ref Range   Glucose-Capillary 185 (H) 70 - 99 mg/dL  Glucose, capillary  Result Value Ref Range   Glucose-Capillary 167 (H) 70 - 99 mg/dL   Comment 1 Notify RN    Comment 2 Document in Chart   Glucose, capillary  Result Value Ref Range   Glucose-Capillary 182 (H) 70 - 99 mg/dL   Comment 1 Notify RN    Comment 2 Document in Chart   Glucose, capillary  Result Value Ref Range   Glucose-Capillary 175 (H) 70 - 99 mg/dL  Type and screen [place]  Result Value Ref Range   ABO/RH(D) O NEG    Antibody Screen NEG    Sample Expiration      05/04/2020,2359 Performed at [facility], [address] [place],  [address]     Chest 2 View  Result Date: 05/01/2020 CLINICAL DATA:  Hypertension EXAM: CHEST - 2 VIEW COMPARISON:  03/10/2014 FINDINGS: Post CABG changes. Stable cardiomediastinal contours. Atherosclerotic calcification of the aortic knob. No focal airspace consolidation, pleural effusion, or pneumothorax. IMPRESSION: No active cardiopulmonary disease. Electronically Signed   By: Davina Poke D.O.   On: 05/01/2020 08:19   DG Cervical Spine 2-3 Views  Result Date: 05/01/2020 CLINICAL DATA:  Cervical fusion EXAM: CERVICAL SPINE - 2-3 VIEW; DG C-ARM 1-60 MIN COMPARISON:  04/20/2020 FINDINGS: 3 C-arm fluoroscopic images were obtained intraoperatively and submitted for post operative interpretation. Posterior rod and screw fixation at C3-C6. 21 seconds of fluoroscopy time was utilized. Please see the performing provider's procedural report for further detail. IMPRESSION: As above. Electronically Signed   By: Davina Poke D.O.   On: 05/01/2020 11:08   MR CERVICAL SPINE WO  CONTRAST  Result Date: 05/03/2020 CLINICAL DATA:  Spinal cord injury, follow-up left arm weakness 48 hours after surgery. EXAM: MRI CERVICAL SPINE WITHOUT CONTRAST TECHNIQUE: Multiplanar, multisequence MR imaging of the cervical spine was performed. No intravenous contrast was administered. COMPARISON:  MRI 03/29/2020 FINDINGS: Alignment: Straightening of the normal cervical lordosis. Similar trace anterolisthesis of C7 on T1. Vertebrae: Status post posterior rod screw fixation at C3  through C6 with hardware better characterized on recent radiographs. No new vertebral body height loss or new bone marrow edema to suggest acute fracture. Multilevel degenerative discogenic endplate signal changes, most pronounced at C5-C6 Cord: Redemonstrated mild T2/STIR hyperintense signal within the cord at the C5-C6 level with mild cord volume loss, compatible with myelomalacia related to prior canal stenosis. Posterior Fossa, vertebral arteries, paraspinal tissues: Extensive edema within the paraspinal musculature spanning C2-C3 through C6-C7, compatible with postoperative changes. Disc levels: C2-C3: No significant canal or foraminal stenosis. C3-C4: Status post posterior fixation and decompression without significant residual canal stenosis. Similar moderate left greater than right foraminal stenosis secondary to bilateral uncovertebral and facet hypertrophy. C4-C5: Status post posterior fixation and decompression without significant residual canal stenosis. Similar bilateral facet and uncovertebral hypertrophy with severe bilateral foraminal stenosis. C5-C6: Status post posterior fixation and decompression without significant residual canal stenosis. Similar bilateral uncovertebral and facet hypertrophy with moderate to severe bilateral foraminal stenosis. C6-C7: Posterior disc osteophyte complex with superimposed left paracentral disc herniation. Overall mild-to-moderate canal stenosis, likely similar to prior when  accounting for differences in patient positioning. Moderate bilateral foraminal stenosis. C7-T1: Posterior disc osteophyte complex with small superimposed central disc protrusion. Mild canal stenosis and mild right greater than left foraminal stenosis, similar to prior. Additional comments: Patchy T2/STIR hyperintense signal within the pons, similar to prior MRI head. IMPRESSION: 1. Status post C3 through C6 posterior rod screw fixation with decompression of the canal and no substantial canal stenosis at these levels. Expected postoperative findings without evidence of large epidural canal hemorrhage. T2 hypointense signal within the ventral canal is favored to reflect prominent CSF pulsation artifact secondary to re-expansion of the canal. 2. Redemonstrated T2/STIR hyperintense signal within the cord at the C5-C6 level with mild cord volume loss, compatible with myelomalacia related to prior canal stenosis. 3. Similar moderate to severe foraminal stenosis at multiple levels, as detailed above. 4. Mild to moderate canal stenosis at C6-C7. Electronically Signed   By: Margaretha Sheffield MD   On: 05/03/2020 10:06   DG C-Arm 1-60 Min  Result Date: 05/01/2020 CLINICAL DATA:  Cervical fusion EXAM: CERVICAL SPINE - 2-3 VIEW; DG C-ARM 1-60 MIN COMPARISON:  04/20/2020 FINDINGS: 3 C-arm fluoroscopic images were obtained intraoperatively and submitted for post operative interpretation. Posterior rod and screw fixation at C3-C6. 21 seconds of fluoroscopy time was utilized. Please see the performing provider's procedural report for further detail. IMPRESSION: As above. Electronically Signed   By: Davina Poke D.O.   On: 05/01/2020 11:08    Antibiotics:  Anti-infectives (From admission, onward)   Start     Dose/Rate Route Frequency Ordered Stop   05/01/20 1300  ceFAZolin (ANCEF) IVPB 2g/100 mL premix        2 g 200 mL/hr over 30 Minutes Intravenous Every 8 hours 05/01/20 1255 05/01/20 2025   05/01/20 1051   vancomycin (VANCOCIN) powder  Status:  Discontinued          As needed 05/01/20 1052 05/01/20 1115   05/01/20 0815  bacitracin 50,000 Units in sodium chloride 0.9 % 500 mL irrigation  Status:  Discontinued          As needed 05/01/20 0942 05/01/20 1115   05/01/20 0715  ceFAZolin (ANCEF) IVPB 2g/100 mL premix        2 g 200 mL/hr over 30 Minutes Intravenous On call to O.R. 05/01/20 6144 05/01/20 3154      Discharge Exam: Blood pressure (!) 164/76, pulse 68, temperature 98.9 F (37.2  C), temperature source Oral, resp. rate 18, height 5\' 11"  (1.803 m), weight 83.9 kg, SpO2 98 %. Neurologic: Grossly normal except for mildly spastic gait and left C5 nerve root dysfunction  dressing clean dry and intact Discharge Medications:   Allergies as of 05/04/2020      Reactions   Sulfonamide Derivatives    REACTION: lethargic      Medication List    TAKE these medications   allopurinol 300 MG tablet Commonly known as: ZYLOPRIM Take 1 tablet (300 mg total) by mouth daily.   aspirin 81 MG tablet Take 81 mg by mouth daily.   atorvastatin 40 MG tablet Commonly known as: LIPITOR Take 1 tablet (40 mg total) by mouth daily.   CARNATION BREAKFAST ESSENTIALS PO Take 1 Package by mouth daily.   carvedilol 20 MG 24 hr capsule Commonly known as: COREG CR Take 1 capsule (20 mg total) by mouth daily.   lisinopril 20 MG tablet Commonly known as: ZESTRIL Take 1 tablet (20 mg total) by mouth daily.   omeprazole 40 MG capsule Commonly known as: PRILOSEC Take 1 capsule (40 mg total) by mouth daily as needed. What changed: reasons to take this   primidone 50 MG tablet Commonly known as: MYSOLINE Take 0.5 tablets (25 mg total) by mouth daily.   tamsulosin 0.4 MG Caps capsule Commonly known as: FLOMAX Take 1 capsule (0.4 mg total) by mouth daily.   vitamin B-12 1000 MCG tablet Commonly known as: CYANOCOBALAMIN Take 1 tablet (1,000 mcg total) by mouth daily.            Durable Medical  Equipment  (From admission, onward)         Start     Ordered   05/01/20 1256  DME Walker rolling  Once       Question Answer Comment  Walker: With [place]   Patient needs a walker to treat with the following condition Gait instability      05/01/20 1255          Disposition: CIR   Final Dx: Cervical decompression and fusion for cervical spondylitic myelopathy, C5 nerve root dyspraxia on left  Discharge Instructions    Call MD for:  redness, tenderness, or signs of infection (pain, swelling, redness, odor or green/yellow discharge around incision site)   Complete by: As directed    Call MD for:  severe uncontrolled pain   Complete by: As directed    Call MD for:  temperature >100.4   Complete by: As directed    Diet - low sodium heart healthy   Complete by: As directed    Increase activity slowly   Complete by: As directed    Remove dressing in 48 hours   Complete by: As directed        Follow-up Information    [facility] Follow up.   Specialty: Home Health Services Why: Home Health PT/OT/Aide services arranged. They will contact you about 48 hours after discharge to schedule the first visit.  Contact information: [place] [address] [phone]        Eustace Moore, MD. Schedule an appointment as soon as possible for a visit in 2 week(s).   Specialty: Neurosurgery Contact information: [address] [place] [address] [phone]                Signed: Eustace Moore 05/04/2020, 2:01 PM

## 2020-05-04 NOTE — PMR Pre-admission (Signed)
PMR Admission Coordinator Pre-Admission Assessment   Patient: [name] [name] is an 86 y.o., male MRN: [id number] DOB: 05/11/1929 Height: 5' 11" (180.3 cm) Weight: 83.9 kg   Insurance Information HMO:     PPO:      PCP:      IPA:      80/20:      OTHER:  PRIMARY: Medicare A and B      Policy#: [id number]      Subscriber: pt CM Name:       Phone#:      Fax#:  Pre-Cert#: verified online      Employer:  Benefits:  Phone #:      Name:  Eff. Date: A 01/08/96, B 03/09/01     Deduct: $1484      Out of Pocket Max: n/a      Life Max: n/a CIR: 100%      SNF: 20 full days Outpatient: 80%     Co-Pay: 20% Home Health: 100%      Co-Pay:  DME: 80%     Co-Pay: 20% Providers:  SECONDARY: Tricare for Life      Policy#: [id number]     Phone#: [phone]   Financial Counselor:       Phone#:    The "Data Collection Information Summary" for patients in Inpatient Rehabilitation Facilities with attached "Privacy Act Statement-Health Care Records" was provided and verbally reviewed with: Patient and Family   Emergency Contact Information         Contact Information     Name Relation Home Work Mobile    Larsen,[name] Daughter     [phone]         Current Medical History  Patient Admitting Diagnosis: cervical myelopathy s/p posterior cervical fusion   History of Present Illness: Pt is an 86 y/o male admitted for surgical management of myelopathy (+ gait disturbance and numbness in B hands) on 05/01/2020 to [place].  PMH significant for CAD s/p CABG, HTN, prostate cancer, and DM.  Post op course complicated by worsening LUE weakness.  Post-op scans completed and revealed no compressive hematoma or lesion.  Dr. 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  Therapy recommending CIR due to ongoing LUE weakness which impairs pt's ability to mobilize safely with an AD.     Patient's medical record from [facility] has been reviewed by the rehabilitation  admission coordinator and physician.   Past Medical History      Past Medical History:  Diagnosis Date  . Acute renal failure (HCC) 12/06/2013  . Back pain      occasionally  . Benign essential tremor 2005    takes Primidone daily  . Choledocholithiasis 12/06/2013  . Constipation      takes OTC stool softener  . Coronary artery disease 2007    s/p CABG, DR NISHAN  . GERD (gastroesophageal reflux disease)      takes Nexium daily  . Gout 1995    "~ once/yr" (03/17/2014)  . History of colon polyps    . Hyperlipidemia 12/2005    takes Atorvastatin daily  . Hypertension 1995    takes Lisinopril and Coreg daily  . possible HCAP (healthcare-associated pneumonia) 12/09/2013  . Prostate cancer (HCC) 2007    S/P treatment, followed by Urology prev  . Right bundle branch block (RBBB)    . Stenosis of cervical spine    . Type II diabetes mellitus (HCC)        mother; Hypothyroidism in his brother; Prostate cancer in his brother.  Prior Rehab/Hospitalizations Has the patient had prior rehab or hospitalizations prior to admission? No  Has the patient had major surgery during 100 days prior to admission? Yes   Current Medications  Current Facility-Administered Medications:  .  0.9 %  sodium chloride infusion, 250 mL, Intravenous, Continuous, Eustace Moore, MD, Last Rate: 1 mL/hr at 05/01/20 1340, 250 mL at 05/01/20 1340 .  0.9 % NaCl with KCl 20 mEq/ L  infusion, , Intravenous, Continuous, Eustace Moore, MD .  acetaminophen (TYLENOL) tablet 650 mg, 650 mg, Oral, Q4H PRN, 650 mg at 05/04/20 1024 **OR** acetaminophen (TYLENOL) suppository 650 mg, 650 mg, Rectal, Q4H PRN, Eustace Moore, MD .  allopurinol (ZYLOPRIM) tablet 300 mg, 300 mg, Oral, Daily, Eustace Moore, MD, 300 mg at 05/04/20 1027 .  alum & mag  hydroxide-simeth (MAALOX/MYLANTA) 200-200-20 MG/5ML suspension 30 mL, 30 mL, Oral, Q6H PRN, Eustace Moore, MD .  carvedilol (COREG CR) 24 hr capsule 20 mg, 20 mg, Oral, Daily, Eustace Moore, MD, 20 mg at 05/04/20 1027 .  dexamethasone (DECADRON) tablet 4 mg, 4 mg, Oral, Q6H, Eustace Moore, MD .  lisinopril (ZESTRIL) tablet 20 mg, 20 mg, Oral, Daily, Eustace Moore, MD, 20 mg at 05/04/20 1027 .  menthol-cetylpyridinium (CEPACOL) lozenge 3 mg, 1 lozenge, Oral, PRN **OR** phenol (CHLORASEPTIC) mouth spray 1 spray, 1 spray, Mouth/Throat, PRN, Eustace Moore, MD .  morphine 2 MG/ML injection 2 mg, 2 mg, Intravenous, Q2H PRN, Eustace Moore, MD .  ondansetron [facility]) tablet 4 mg, 4 mg, Oral, Q6H PRN **OR** ondansetron (ZOFRAN) injection 4 mg, 4 mg, Intravenous, Q6H PRN, Eustace Moore, MD .  oxyCODONE (Oxy IR/ROXICODONE) immediate release tablet 5-10 mg, 5-10 mg, Oral, Q3H PRN, Eustace Moore, MD, 5 mg at 05/04/20 0456 .  primidone (MYSOLINE) tablet 25 mg, 25 mg, Oral, Daily, Eustace Moore, MD, 25 mg at 05/04/20 1027 .  senna (SENOKOT) tablet 8.6 mg, 1 tablet, Oral, BID, Eustace Moore, MD, 8.6 mg at 05/04/20 1027 .  sodium chloride flush (NS) 0.9 % injection 3 mL, 3 mL, Intravenous, Q12H, Eustace Moore, MD, 3 mL at 05/04/20 1028 .  sodium chloride flush (NS) 0.9 % injection 3 mL, 3 mL, Intravenous, PRN, Eustace Moore, MD .  tamsulosin [facility]) capsule 0.4 mg, 0.4 mg, Oral, Daily, Eustace Moore, MD, 0.4 mg at 05/04/20 1027  Patients Current Diet:  Diet Order            Diet Carb Modified Fluid consistency: Thin; Room service appropriate? Yes  Diet effective now                 Precautions / Restrictions Precautions Precautions: Cervical, Fall Precaution Booklet Issued: No Precaution Comments: reviewed precautions with patient, able to recall 3/3 Restrictions Weight Bearing Restrictions: No   Has the patient had 2 or more falls or a fall with injury in the past year? No  Prior  Activity Level Limited Community (1-2x/wk): using a small based quad cane prior to admit, not driving  Prior Functional Level Self Care: Did the patient need help bathing, dressing, using the toilet or eating? Independent  Indoor Mobility: Did the patient need assistance with walking from room to room (with or without device)? Independent  Stairs: Did the patient need assistance with internal or external stairs (with or without device)? Independent  Functional Cognition: Did the patient need help  planning regular tasks such as shopping or remembering to take medications? [place] / Equipment Home Assistive Devices/Equipment: Eyeglasses, Dentures (specify type), Cane (specify quad or straight), Walker (specify type) Home Equipment: Grab bars - toilet, Shower seat, Environmental consultant - 2 wheels, Cane - quad  Prior Device Use: Indicate devices/aids used by the patient prior to current illness, exacerbation or injury? quad cane  Current Functional Level Cognition  Overall Cognitive Status: Within Functional Limits for tasks assessed Orientation Level: Oriented X4 General Comments: pt with slight anxiety from increased L sided weakness     Extremity Assessment (includes Sensation/Coordination)  Upper Extremity Assessment: LUE deficits/detail LUE Deficits / Details: decreased functional use of L UE; shoulder flexion and abduction 2-/5, elbow flexion 3-/5, supination 3-/5 and wrist/hand 3+/5  LUE Coordination: decreased fine motor, decreased gross motor  Lower Extremity Assessment: Defer to PT evaluation    ADLs  Overall ADL's : Needs assistance/impaired Grooming: Minimal assistance, Standing Grooming Details (indicate cue type and reason): min assist today for mgmt of L UE, decreased elbow flexion today requiring incraesed assist  Upper Body Bathing: Set up, Sitting Lower Body Bathing: Sit to/from stand, Min guard Upper Body Dressing : Minimal assistance, Sitting, Cueing  for compensatory techniques Upper Body Dressing Details (indicate cue type and reason): threading L UE first, requires assist to bring 2nd gown around; increased time required  Lower Body Dressing: Minimal assistance, Sit to/from stand Lower Body Dressing Details (indicate cue type and reason): assist to adjust socks and min guard in static standing  Toilet Transfer: Min guard, Ambulation, RW Toilet Transfer Details (indicate cue type and reason): simulated in room Tub/ Shower Transfer: Tub transfer, Min guard, Ambulation Tub/Shower Transfer Details (indicate cue type and reason): simulated technique in room, min guard for safety with cueing for lateral stepping over threshold with UE support  Functional mobility during ADLs: Min guard, Rolling walker, Cueing for sequencing General ADL Comments: pt limited by decreased functional use of L UE, imapired balance and safety; functional mobility with RW and cueing required for R sided preference to maintain midline     Mobility  Overal bed mobility: Needs Assistance Bed Mobility: Rolling, Sidelying to Sit Rolling: Supervision Sidelying to sit: Supervision Sit to sidelying: Supervision General bed mobility comments: Pt was received sitting up in the recliner chair.     Transfers  Overall transfer level: Needs assistance Equipment used: Quad cane Transfers: Sit to/from Stand Sit to Stand: Min guard General transfer comment: Hands-on guarding for power up to full standing position.     Ambulation / Gait / Stairs / Wheelchair Mobility  Ambulation/Gait Ambulation/Gait assistance: Min assist, Mod assist Gait Distance (Feet): 200 Feet Assistive device: Quad cane Gait Pattern/deviations: Step-through pattern, Decreased dorsiflexion - right, Decreased dorsiflexion - left, Decreased step length - left, Decreased step length - right, Staggering right General Gait Details: With cane, pt required consistent min assist for balance support as pt  frequently with right lateral instability and loss of balance. 1 instance of significant loss of balance requiring light mod assist to recover. VC's for proper QC use.  Gait velocity: decreased Gait velocity interpretation: <1.31 ft/sec, indicative of household ambulator Stairs: Yes Stairs assistance: Min assist Stair Management: One rail Right Number of Stairs: 3 General stair comments: 1 step x3 attempts. Unsteady and with increased pull with RUE on rail to advance. VC's for minimal UE pull on rail to protect cervical spine.     Posture / Balance Dynamic Sitting Balance Sitting balance -  Comments: pt able to sit EOB without UE support Balance Overall balance assessment: Needs assistance Sitting-balance support: No upper extremity supported, Feet supported Sitting balance-Leahy Scale: Fair Sitting balance - Comments: pt able to sit EOB without UE support Standing balance support: Single extremity supported, During functional activity Standing balance-Leahy Scale: Poor Standing balance comment: Without BUE support pt requires assistance    Special needs/care consideration Skin posterior cervical incision, Diabetic management yes and Designated visitor daughter, [name]   Previous Home Environment (from acute therapy documentation) Living Arrangements: Children, Other relatives Available Help at Discharge: Family, Available PRN/intermittently Type of Home: House Home Layout: One level Home Access: Stairs to enter Entrance Stairs-Rails: None Entrance Stairs-Number of Steps: 3 Bathroom Shower/Tub: Optometrist: Yes Home Care Services: No  Discharge Living Setting Plans for Discharge Living Setting: Lives with (comment) (daughter) Type of Home at Discharge: House Discharge Home Layout: One level Discharge Home Access: Stairs to enter Entrance Stairs-Rails: None Entrance Stairs-Number of Steps: 3 Discharge Bathroom Shower/Tub:  Tub/shower unit, Walk-in shower Discharge Bathroom Toilet: Standard Discharge Bathroom Accessibility: Yes How Accessible: Accessible via walker Does the patient have any problems obtaining your medications?: No  Social/Family/Support Systems Anticipated Caregiver: daughter, Ardyth Gal  Anticipated Caregiver's Contact Information: [phone] Ability/Limitations of Caregiver: works as a Pharmacist, hospital, not available during the days Caregiver Availability: Evenings only Discharge Plan Discussed with Primary Caregiver: Yes Is Caregiver In Agreement with Plan?: Yes Does Caregiver/Family have Issues with Lodging/Transportation while Pt is in Rehab?: No  Goals Patient/Family Goal for Rehab: PT/OT mod I Expected length of stay: 6-9 days Pt/Family Agrees to Admission and willing to participate: Yes Program Orientation Provided & Reviewed with Pt/Caregiver Including Roles  & Responsibilities: Yes  Barriers to Discharge: Decreased caregiver support, Inaccessible home environment  Decrease burden of Care through IP rehab admission: n/a  Possible need for SNF placement upon discharge:  Not anticipated  Patient Condition: I have reviewed medical records from [facility], spoken with CSW, and patient. I met with patient at the bedside for inpatient rehabilitation assessment.  Patient will benefit from ongoing PT and OT, can actively participate in 3 hours of therapy a day 5 days of the week, and can make measurable gains during the admission.  Patient will also benefit from the coordinated team approach during an Inpatient Acute Rehabilitation admission.  The patient will receive intensive therapy as well as Rehabilitation physician, nursing, social worker, and care management interventions.  Due to safety, skin/wound care, disease management, pain management and patient education the patient requires 24 hour a day rehabilitation nursing.  The patient is currently min to mod assist with mobility and  basic ADLs.  Discharge setting and therapy post discharge at home with home health is anticipated.  Patient has agreed to participate in the Acute Inpatient Rehabilitation Program and will admit today.  Preadmission Screen Completed By:  Michel Santee, PT, DPT 05/04/2020 12:15 PM ______________________________________________________________________   Discussed status with Dr. Dagoberto Ligas on 05/04/20 at 12:23 PM and received approval for admission today.  Admission Coordinator:  Michel Santee, PT, DPT time 12:24 PM Sudie Grumbling 05/04/20    Assessment/Plan: Diagnosis: 1. Does the need for close, 24 hr/day Medical supervision in concert with the patient's rehab needs make it unreasonable for this patient to be served in a less intensive setting? Yes 2. Co-Morbidities requiring supervision/potential complications: DM, HTN, HLD, CAD, benign essential tremor, C5 nerve root palsy 3. Due to bowel management, safety, skin/wound care, disease management,  medication administration, pain management and patient education, does the patient require 24 hr/day rehab nursing? Yes 4. Does the patient require coordinated care of a physician, rehab nurse, PT, OT, and SLP to address physical and functional deficits in the context of the above medical diagnosis(es)? Yes Addressing deficits in the following areas: balance, endurance, locomotion, strength, transferring, bathing, dressing, feeding, grooming and toileting 5. Can the patient actively participate in an intensive therapy program of at least 3 hrs of therapy 5 days a week? Yes 6. The potential for patient to make measurable gains while on inpatient rehab is excellent 7. Anticipated functional outcomes upon discharge from inpatient rehab: modified independent PT, modified independent OT, n/a SLP 8. Estimated rehab length of stay to reach the above functional goals is: 6-9 days 9. Anticipated discharge destination: Home 10. Overall Rehab/Functional Prognosis:  excellent   MD Signature:

## 2020-05-04 NOTE — Progress Notes (Signed)
Patient ID: [name] [name], male   DOB: 09-17-1928, 86 y.o.   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  Bicep remains 4 out of 5, hand grip 4+ out of 5, wrist extension 4- out of 5.  Right arm remains strong and gait remains strong.  However he is spastic in his gait, and his presenting problem with gait disturbance with falling.  I have significant concerns about him going home and falling.  Given his C5 nerve root dysfunction I think it will be difficult to control with a walker.  He potentially could use a cane but if he fell to the left he would have no way to catch himself.  I am actually shocked to that he is not a candidate for rehabilitation.  An 86 year old with myelopathy and gait disturbance with significant fall risk and now C5 nerve root dysfunction seems to be the perfect candidate on the surface.  Certainly do not want to send him to a skilled nursing facility given recent Covid surge.  And then I am not completely convinced he is safe at home without dedicated 24-hour supervision or assistance.

## 2020-05-04 NOTE — Progress Notes (Signed)
Admission note:  Pt arrived with RN & NT from previous unit and was able to transfer to chair with contact guard assist. Pt arrived calm, awake, alert, and oriented x 4; and was educated on the "call, don't fall" policy. Pt voiced understanding and the pt was placed on the alarming safety belt for a reminder. This nurse performed initial assessment, helped the patient order dinner, and assured comfort while gathering further information for admission. Pt is resting comfortably in chair with cell phone and call light in reach.

## 2020-05-04 NOTE — TOC Transition Note (Signed)
Transition of Care [facility]) - CM/SW Discharge Note   Patient Details  Name: CLARA SMOLEN MRN: [id number] Date of Birth: [birth year]  Transition of [facility] CM/SW Contact:  Zenon Mayo, RN Phone Number: 05/04/2020, 1:43 PM   Clinical Narrative:    Patient is for dc to CIR.     Final next level of care: IP Rehab Facility Barriers to Discharge: No Barriers Identified   Patient Goals and CMS Choice Patient states their goals for this hospitalization and ongoing recovery are:: Get stronger CMS Medicare.gov Compare Post Acute Care list provided to:: Patient Choice offered to / list presented to : Patient  Discharge Placement                       Discharge Plan and Services In-house Referral: Clinical Social Work Discharge Planning Services: CM Consult Post Acute Care Choice: Home Health                    HH Arranged: PT, OT, Nurse's Aide [place] Agency: [facility] Date [place]: 05/04/20 Time [place]: [phone] Representative spoke with at [place]: [place] Determinants of Health ([place]) Interventions     Readmission Risk Interventions No flowsheet data found.

## 2020-05-04 NOTE — Progress Notes (Signed)
Occupational Therapy Treatment Patient Details Name: [name] [name] MRN: [id number] DOB: Dec 24, 1928 Today's Date: 05/04/2020    History of present illness pt is a 86 yo male who is s/p C3-6 posterior fusion. Pt has a PMH of CAD, HLD, HTN, T2DM.    OT comments  Patient supine in bed and reports L UE remains difficulty to use and impacting function.  Patient transitioned to EOB with supervision, cueing to ensure log roll technique. Engaged in EOB exercises for L UE, noted decreased elbow flexion today requiring gravity eliminated positioning to promote full ROM supported and decreased supination requiring support under forearm and increased time for full AAROM.  Patient educated on exercises and provided handout for L arm lap slides, AA/SROM elbow flexion and supination.  Functionally he continues to require min assist for UB dressing, LB dressing and grooming, due to decreased use of L UE; transfers he has to use R UE to place L UE on walker before transitioning with min guard assist.  During mobility with walker, patient preference towards R side of walker and requires mod cueing to maintain midline to prevent LOB or falls. Updated dc plan and believe he will benefit from CIR level to optimize independence and return to PLOF with ADLs/mobility.  Will follow acutely.    Follow Up Recommendations  CIR;Supervision/Assistance - 24 hour    Equipment Recommendations  None recommended by OT    Recommendations for Other Services      Precautions / Restrictions Precautions Precautions: Cervical;Fall Precaution Comments: reviewed precautions with patient, able to recall 3/3 Restrictions Weight Bearing Restrictions: No       Mobility Bed Mobility Overal bed mobility: Needs Assistance Bed Mobility: Rolling;Sidelying to Sit Rolling: Supervision Sidelying to sit: Supervision       General bed mobility comments: cueing for technique, to ensure use of log roll  Transfers Overall transfer  level: Needs assistance Equipment used: Rolling walker (2 wheeled) Transfers: Sit to/from Stand Sit to Stand: Min guard         General transfer comment: for safety/balance, cueing for hand placement; has to use RUE to plan LUE on walker     Balance Overall balance assessment: Needs assistance Sitting-balance support: No upper extremity supported;Feet supported Sitting balance-Leahy Scale: Good     Standing balance support: Bilateral upper extremity supported;No upper extremity supported;During functional activity Standing balance-Leahy Scale: Fair Standing balance comment: relies on UE support dynamically, statically min guard without UE support                           ADL either performed or assessed with clinical judgement   ADL Overall ADL's : Needs assistance/impaired     Grooming: Minimal assistance;Standing Grooming Details (indicate cue type and reason): min assist today for mgmt of L UE, decreased elbow flexion today requiring incraesed assist          Upper Body Dressing : Minimal assistance;Sitting;Cueing for compensatory techniques Upper Body Dressing Details (indicate cue type and reason): threading L UE first, requires assist to bring 2nd gown around; increased time required  Lower Body Dressing: Minimal assistance;Sit to/from stand Lower Body Dressing Details (indicate cue type and reason): assist to adjust socks and min guard in static standing  Toilet Transfer: Min guard;Ambulation;RW Toilet Transfer Details (indicate cue type and reason): simulated in room         Functional mobility during ADLs: Min guard;Rolling walker;Cueing for sequencing General ADL Comments: pt limited by decreased  functional use of L UE, imapired balance and safety; functional mobility with RW and cueing required for R sided preference to maintain midline      Vision       Perception     Praxis      Cognition Arousal/Alertness: Awake/alert Behavior During  Therapy: WFL for tasks assessed/performed Overall Cognitive Status: Within Functional Limits for tasks assessed                                          Exercises Exercises: General Upper Extremity;Other exercises General Exercises - Upper Extremity Shoulder Flexion: AAROM;Left;5 reps;Seated Shoulder Extension: AROM;Left;5 reps;Seated Elbow Flexion: 5 reps;Left;Seated;AAROM (gravity eliminated position ) Elbow Extension: Left;5 reps;Seated;AAROM (gravity elimiated position) Wrist Flexion: AROM;Left;5 reps;Seated Wrist Extension: AROM;Left;5 reps;Seated Digit Composite Flexion: AROM;Left;5 reps;Seated Composite Extension: AROM;Left;5 reps;Seated Other Exercises Other Exercises: AAROM, L, 5 reps, seated supination/pronation  Other Exercises: shoulder lap slides x 10 reps    Shoulder Instructions       General Comments issued HEP and reviewed exercises below     Pertinent Vitals/ Pain       Pain Assessment: Faces Faces Pain Scale: Hurts a little bit Pain Location: neck Pain Descriptors / Indicators: Operative site guarding;Guarding;Sore Pain Intervention(s): Monitored during session;Repositioned  Home Living                                          Prior Functioning/Environment              Frequency  Min 2X/week        Progress Toward Goals  OT Goals(current goals can now be found in the care plan section)  Progress towards OT goals: Progressing toward goals  Acute Rehab OT Goals Patient Stated Goal: improved strength in LUE OT Goal Formulation: With patient  Plan Discharge plan needs to be updated;Frequency remains appropriate    Co-evaluation                 AM-PAC OT "6 Clicks" Daily Activity     Outcome Measure   Help from another person eating meals?: A Little Help from another person taking care of personal grooming?: A Little Help from another person toileting, which includes using toliet, bedpan, or  urinal?: A Little Help from another person bathing (including washing, rinsing, drying)?: A Little Help from another person to put on and taking off regular upper body clothing?: A Little Help from another person to put on and taking off regular lower body clothing?: A Little 6 Click Score: 18    End of Session Equipment Utilized During Treatment: Rolling walker  OT Visit Diagnosis: Other abnormalities of gait and mobility (R26.89);Muscle weakness (generalized) (M62.81);Pain;Other symptoms and signs involving the nervous system (R29.898) Pain - part of body:  (neck)   Activity Tolerance Patient tolerated treatment well   Patient Left with call bell/phone within reach;in chair   Nurse Communication Mobility status        Time: 6063-0160 OT Time Calculation (min): 43 min  Charges: OT General Charges $OT Visit: 1 Visit OT Treatments $Self Care/Home Management : 8-22 mins $Therapeutic Activity: 8-22 mins $Neuromuscular Re-education: 8-22 mins   Jolaine Artist, OT [place] Pager [phone] Office [phone]    Delight Stare 05/04/2020, 10:23 AM

## 2020-05-04 NOTE — H&P (Signed)
Physical Medicine and Rehabilitation Admission H&P   CC: Functional decline   HPI: [name] [name] is a 86 year old male with history of GERD, essential tremor, Covid 02/2020, gait disturbance with numbness bilateral hands due to HNP with spondylosis and severe canal stenosis C3-C6 and was admitted on 05/01/20 for posterior cervical arthrodesis C3-C6 by Dr. Ronnald Ramp. Post op with ataxic gait as well as LUE weakness. Decadron added due to concerns of C5 nerve palsy and follow up MRI C spine showed expected post op changes without hemorrhage and lack of canal stenosis C3-C6 with mild myelomalacia. Therapy ongoing and CIR recommended due to functional decline.   Pt reports had N/T in fingers and toes prior to surgery- now has L C5 nerve root palsy- LBM Monday- is constipated.  Pain controlled, but not taking more than tylenol because constipation.    Review of Systems  Constitutional: Negative for chills and fever.  HENT: Negative for hearing loss and tinnitus.   Eyes: Negative for blurred vision and double vision.  Respiratory: Positive for wheezing. Negative for sputum production and shortness of breath.   Cardiovascular: Negative for chest pain and leg swelling.  Gastrointestinal: Positive for constipation (has not had BM since admission). Negative for heartburn and nausea.  Genitourinary: Negative for dysuria and urgency.  Musculoskeletal: Positive for neck pain. Negative for myalgias.  Skin: Negative for itching and rash.  Neurological: Positive for weakness (LUE weakness new since surgery). Negative for dizziness and headaches.       Has had balance problems for a year.  Psychiatric/Behavioral: The patient has insomnia (has been sleeping during the day). The patient is not nervous/anxious.   All other systems reviewed and are negative.    Past Medical History:  Diagnosis Date  . Acute renal failure ([place]) 12/06/2013  . Back pain    occasionally  . Benign essential tremor 2005    takes Primidone daily  . Choledocholithiasis 12/06/2013  . Constipation    takes OTC stool softener  . Coronary artery disease 2007   s/p CABG, DR Johnsie Cancel  . GERD (gastroesophageal reflux disease)    takes Nexium daily  . Gout 1995   "~ once/yr" (03/17/2014)  . History of colon polyps   . Hyperlipidemia 12/2005   takes Atorvastatin daily  . Hypertension 1995   takes Lisinopril and Coreg daily  . possible HCAP (healthcare-associated pneumonia) 12/09/2013  . Prostate cancer ([place]) 2007   S/P treatment, followed by Urology prev  . Right bundle branch block (RBBB)   . Stenosis of cervical spine   . Type II diabetes mellitus ([facility])    no meds;diet and exercise controlled   . Wears dentures   . Wears glasses     Past Surgical History:  Procedure Laterality Date  . Adenosine myoview  01/24/2006   Ischemia by EKG, Cath  . CATARACT EXTRACTION W/ INTRAOCULAR LENS IMPLANT Left 10/2013  . CHOLECYSTECTOMY N/A 12/06/2013   Procedure: Diagnostic Laparoscopy for  drainage Intrabdominal abcess;  Surgeon: Rolm Bookbinder, MD;  Location: [facility];  Service: General;  Laterality: N/A;  . CHOLECYSTECTOMY N/A 03/17/2014   Procedure: LAPAROSCOPIC CHOLECYSTECTOMY ;  Surgeon: Rolm Bookbinder, MD;  Location: [facility];  Service: General;  Laterality: N/A;  . COLONOSCOPY    . CORONARY ARTERY BYPASS GRAFT  2007   CABGX 4  . CYSTOSCOPY  02/15/2004   Mod BPH, moder tribec ?  Marland Kitchen ERCP N/A 12/06/2013   Procedure: ENDOSCOPIC RETROGRADE CHOLANGIOPANCREATOGRAPHY (ERCP);  Surgeon: Milus Banister,  MD;  Location: [facility];  Service: Endoscopy;  Laterality: N/A;  . LAPAROSCOPIC CHOLECYSTECTOMY  03/17/2014  . LUMBAR FUSION  02/2012   lumbar spine for spinal stenosis  . POSTERIOR CERVICAL FUSION/FORAMINOTOMY N/A 05/01/2020   Procedure: Posterior cervical fusion with lateral mass fixation - Cervical three - Cervical six, laminectomy Cervical three-six;  Surgeon: Eustace Moore, MD;  Location: [facility];  Service: Neurosurgery;   Laterality: N/A;  . PROSTATE CRYOABLATION  09/18/2005   prostate CA    Family History  Problem Relation Age of Onset  . Heart disease Mother   . Cancer Mother   . Cancer Sister   . Diabetes Sister   . Cancer Brother        prostate  . Prostate cancer Brother   . Hypothyroidism Brother   . Depression Neg Hx   . Alcohol abuse Neg Hx   . Drug abuse Neg Hx   . Stroke Neg Hx   . Colon cancer Neg Hx     Social History:  Widower--wife died last year and has been living with daughter since. Was independent with cane PTA.  He reports that he has never smoked. He has never used smokeless tobacco. He reports that he does not drink alcohol and does not use drugs.   Allergies  Allergen Reactions  . Sulfonamide Derivatives     REACTION: lethargic    Medications Prior to Admission  Medication Sig Dispense Refill  . allopurinol (ZYLOPRIM) 300 MG tablet Take 1 tablet (300 mg total) by mouth daily. 90 tablet 3  . aspirin 81 MG tablet Take 81 mg by mouth daily.    Marland Kitchen atorvastatin (LIPITOR) 40 MG tablet Take 1 tablet (40 mg total) by mouth daily. 90 tablet 3  . carvedilol (COREG CR) 20 MG 24 hr capsule Take 1 capsule (20 mg total) by mouth daily. 90 capsule 3  . lisinopril (ZESTRIL) 20 MG tablet Take 1 tablet (20 mg total) by mouth daily. 90 tablet 3  . Nutritional Supplements (CARNATION BREAKFAST ESSENTIALS PO) Take 1 Package by mouth daily.     Marland Kitchen omeprazole (PRILOSEC) 40 MG capsule Take 1 capsule (40 mg total) by mouth daily as needed. (Patient taking differently: Take 40 mg by mouth daily as needed (Heartburn). ) 90 capsule 3  . primidone (MYSOLINE) 50 MG tablet Take 0.5 tablets (25 mg total) by mouth daily. 45 tablet 3  . tamsulosin (FLOMAX) 0.4 MG CAPS capsule Take 1 capsule (0.4 mg total) by mouth daily. 90 capsule 3  . vitamin B-12 (CYANOCOBALAMIN) 1000 MCG tablet Take 1 tablet (1,000 mcg total) by mouth daily.      Drug Regimen Review  Drug regimen was reviewed and remains  appropriate with no significant issues identified  Home: Home Living Family/patient expects to be discharged to:: Private residence Living Arrangements: Children, Other relatives   Functional History:    Functional Status:  Mobility:          ADL:    Cognition: Cognition Orientation Level: Oriented X4     Blood pressure (!) 150/85, pulse 68, temperature 98.6 F (37 C), temperature source Oral, resp. rate 14, height 5\' 11"  (1.803 m), SpO2 98 %. Physical Exam Vitals and nursing note reviewed.  Constitutional:      Appearance: Normal appearance.     Comments: Awake, alert, appropriate, sitting up in bed; NAD  HENT:     Head: Normocephalic and atraumatic.     Comments: Smile equal    Nose: Nose normal. No  congestion.     Mouth/Throat:     Mouth: Mucous membranes are moist.     Pharynx: Oropharynx is clear. No oropharyngeal exudate.  Eyes:     General:        Right eye: No discharge.        Left eye: No discharge.     Extraocular Movements: Extraocular movements intact.  Neck:     Comments: Honeycomb dressing on incision- no erythema seen Cardiovascular:     Rate and Rhythm: Normal rate and regular rhythm.     Heart sounds: Normal heart sounds. No murmur heard.  No gallop.   Pulmonary:     Effort: Pulmonary effort is normal.     Breath sounds: No stridor.     Comments: Audible wheezing intermittently and worse with minimal exertion.   Audible wheezing- but on exam, improved somewhat- but was noticeable with exertion as well- good air movement B/L Abdominal:     General: Bowel sounds are decreased. There is distension.     Comments: Distended- firmish- NT; hypoactive  Musculoskeletal:     Cervical back: Tenderness present. No rigidity.     Comments: RUE- 5-/5 in deltoid, biceps, triceps, WE, grip and finger abd LUE- biceps 0-1/5, Triceps 4+/5, WE 4+/5, grip 5-/5 and finger abd 5-/5 LEs 5-/5 in HF, KE, KF, DF and PF B/L  Skin:    Comments: Backside- no  wounds or redness Heels good- no bogginess IV R forearm- looks good  Neurological:     Mental Status: He is alert and oriented to person, place, and time.     Comments: Speech clear. Decreased hearing.    Decreased sensation in L C5 dermatome Also in B/L fingertips and toes- but not otherwise  Psychiatric:        Mood and Affect: Mood normal.        Behavior: Behavior normal.     Results for orders placed or performed during the hospital encounter of 05/04/20 (from the past 48 hour(s))  Glucose, capillary     Status: Abnormal   Collection Time: 05/04/20  4:58 PM  Result Value Ref Range   Glucose-Capillary 174 (H) 70 - 99 mg/dL    Comment: Glucose reference range applies only to samples taken after fasting for at least 8 hours.   MR CERVICAL SPINE WO CONTRAST  Result Date: 05/03/2020 CLINICAL DATA:  Spinal cord injury, follow-up left arm weakness 48 hours after surgery. EXAM: MRI CERVICAL SPINE WITHOUT CONTRAST TECHNIQUE: Multiplanar, multisequence MR imaging of the cervical spine was performed. No intravenous contrast was administered. COMPARISON:  MRI 03/29/2020 FINDINGS: Alignment: Straightening of the normal cervical lordosis. Similar trace anterolisthesis of C7 on T1. Vertebrae: Status post posterior rod screw fixation at C3 through C6 with hardware better characterized on recent radiographs. No new vertebral body height loss or new bone marrow edema to suggest acute fracture. Multilevel degenerative discogenic endplate signal changes, most pronounced at C5-C6 Cord: Redemonstrated mild T2/STIR hyperintense signal within the cord at the C5-C6 level with mild cord volume loss, compatible with myelomalacia related to prior canal stenosis. Posterior Fossa, vertebral arteries, paraspinal tissues: Extensive edema within the paraspinal musculature spanning C2-C3 through C6-C7, compatible with postoperative changes. Disc levels: C2-C3: No significant canal or foraminal stenosis. C3-C4: Status  post posterior fixation and decompression without significant residual canal stenosis. Similar moderate left greater than right foraminal stenosis secondary to bilateral uncovertebral and facet hypertrophy. C4-C5: Status post posterior fixation and decompression without significant residual canal stenosis. Similar bilateral facet  and uncovertebral hypertrophy with severe bilateral foraminal stenosis. C5-C6: Status post posterior fixation and decompression without significant residual canal stenosis. Similar bilateral uncovertebral and facet hypertrophy with moderate to severe bilateral foraminal stenosis. C6-C7: Posterior disc osteophyte complex with superimposed left paracentral disc herniation. Overall mild-to-moderate canal stenosis, likely similar to prior when accounting for differences in patient positioning. Moderate bilateral foraminal stenosis. C7-T1: Posterior disc osteophyte complex with small superimposed central disc protrusion. Mild canal stenosis and mild right greater than left foraminal stenosis, similar to prior. Additional comments: Patchy T2/STIR hyperintense signal within the pons, similar to prior MRI head. IMPRESSION: 1. Status post C3 through C6 posterior rod screw fixation with decompression of the canal and no substantial canal stenosis at these levels. Expected postoperative findings without evidence of large epidural canal hemorrhage. T2 hypointense signal within the ventral canal is favored to reflect prominent CSF pulsation artifact secondary to re-expansion of the canal. 2. Redemonstrated T2/STIR hyperintense signal within the cord at the C5-C6 level with mild cord volume loss, compatible with myelomalacia related to prior canal stenosis. 3. Similar moderate to severe foraminal stenosis at multiple levels, as detailed above. 4. Mild to moderate canal stenosis at C6-C7. Electronically Signed   By: Margaretha Sheffield MD   On: 05/03/2020 10:06       Medical Problem List and Plan: 1.   Impaired function/C5 nerve root dyspraxia/palsy s/p C3-C6 posterior fusion with LUE weakness/N/T and impaired balance, gait and LUE movement  -patient may  Shower if covers incision  -ELOS/Goals: 6-9 days- goals Mod I 2.  Antithrombotics: -DVT/anticoagulation:  Mechanical: Sequential compression devices, below knee Bilateral lower extremities  -antiplatelet therapy: get OK to restart 3. Pain Management: per pt request, will change Oxy to Norco to help with constipation and robaxin 4. Mood: LCSW to follow for evaluation and support.   -antipsychotic agents: N/A 5. Neuropsych: This patient is capable of making decisions on his own behalf. 6. Skin/Wound Care: Routine pressure relief measures.  7. Fluids/Electrolytes/Nutrition: Monitor I/O. Check lytes in am. 8. HTN: Monitor BP TID--continue coreg and Lisinopril daily. 9. BPH: On Flomax daily.  10. CKD?/ chronic Anemia?: Will check post op labs in am.  63. T2DM: Diet controlled. Monitor with ac/hs and use SSI for elevated BS especially as on steroids.  12. Constipation: Continue senna S. Will add miralax also and order enema tonight.  will also add Sorbitol if no BM   Ivan Anchors. Love, PA-C 05/04/2020    I have personally performed a face to face diagnostic evaluation of this patient and formulated the key components of the plan.  Additionally, I have personally reviewed laboratory data, imaging studies, as well as relevant notes and concur with the physician assistant's documentation above.   The patient's status has not changed from the original H&P.  Any changes in documentation from the acute care chart have been noted above.     Courtney Heys, MD 05/04/2020

## 2020-05-04 NOTE — Progress Notes (Signed)
Physical Therapy Treatment Patient Details Name: [name] [name] MRN: [id number] DOB: 06-28-1929 Today's Date: 05/04/2020    History of Present Illness pt is a 86 yo male who is s/p C3-6 posterior fusion. Pt has a PMH of CAD, HLD, HTN, T2DM.     PT Comments    Pt progressing with post-op mobility, however noted continued LUE deficits which pt reports is worse today. Session focused on functional mobility with the quad cane as this is what he was using PTA. Without BUE support, pt requiring consistent min assist for balance support (mainly losses of balance to the right), with one episode requiring mod assist to recover. Pt is at a high risk for falls and recommend he have supervision at d/c for safety. We reinforced education on precautions, appropriate activity progression, and car transfer. Pt also educated on CIR admission criteria, as he is again asking for admission prior to return home. I do feel he would benefit from a short stay at CIR to maximize functional independence, safety and return to PLOF, as he continues to demonstrate a progressive functional decline each day s/p surgery. Will continue to follow.      Follow Up Recommendations  [facility]     Equipment Recommendations  None recommended by PT    Recommendations for Other Services       Precautions / Restrictions Precautions Precautions: Cervical;Fall Precaution Booklet Issued: No Precaution Comments: reviewed precautions with patient, able to recall 3/3 Restrictions Weight Bearing Restrictions: No    Mobility  Bed Mobility Overal bed mobility: Needs Assistance Bed Mobility: Rolling;Sidelying to Sit Rolling: Supervision Sidelying to sit: Supervision       General bed mobility comments: Pt was received sitting up in the recliner chair.   Transfers Overall transfer level: Needs assistance Equipment used: Quad cane Transfers: Sit to/from Stand Sit to Stand: Min guard         General transfer comment:  Hands-on guarding for power up to full standing position.   Ambulation/Gait Ambulation/Gait assistance: Min assist;Mod assist Gait Distance (Feet): 200 Feet Assistive device: Quad cane Gait Pattern/deviations: Step-through pattern;Decreased dorsiflexion - right;Decreased dorsiflexion - left;Decreased step length - left;Decreased step length - right;Staggering right Gait velocity: decreased Gait velocity interpretation: <1.31 ft/sec, indicative of household ambulator General Gait Details: With cane, pt required consistent min assist for balance support as pt frequently with right lateral instability and loss of balance. 1 instance of significant loss of balance requiring light mod assist to recover. VC's for proper QC use.    Stairs Stairs: Yes Stairs assistance: Min assist Stair Management: One rail Right Number of Stairs: 3 General stair comments: 1 step x3 attempts. Unsteady and with increased pull with RUE on rail to advance. VC's for minimal UE pull on rail to protect cervical spine.    Wheelchair Mobility    Modified Rankin (Stroke Patients Only)       Balance Overall balance assessment: Needs assistance Sitting-balance support: No upper extremity supported;Feet supported Sitting balance-Leahy Scale: Fair     Standing balance support: Single extremity supported;During functional activity Standing balance-Leahy Scale: Poor Standing balance comment: Without BUE support pt requires assistance                            Cognition Arousal/Alertness: Awake/alert Behavior During Therapy: WFL for tasks assessed/performed Overall Cognitive Status: Within Functional Limits for tasks assessed  Exercises General Exercises - Upper Extremity Shoulder Flexion: AAROM;Left;5 reps;Seated Shoulder Extension: AROM;Left;5 reps;Seated Elbow Flexion: 5 reps;Left;Seated;AAROM (gravity eliminated position ) Elbow  Extension: Left;5 reps;Seated;AAROM (gravity elimiated position) Wrist Flexion: AROM;Left;5 reps;Seated Wrist Extension: AROM;Left;5 reps;Seated Digit Composite Flexion: AROM;Left;5 reps;Seated Composite Extension: AROM;Left;5 reps;Seated Other Exercises Other Exercises: AAROM, L, 5 reps, seated supination/pronation  Other Exercises: shoulder lap slides x 10 reps     General Comments General comments (skin integrity, edema, etc.): issued HEP and reviewed exercises below       Pertinent Vitals/Pain Pain Assessment: Faces Faces Pain Scale: Hurts little more Pain Location: neck Pain Descriptors / Indicators: Operative site guarding;Guarding;Sore Pain Intervention(s): Limited activity within patient's tolerance;Monitored during session;Repositioned    Home Living                      Prior Function            PT Goals (current goals can now be found in the care plan section) Acute Rehab PT Goals Patient Stated Goal: improved strength in LUE PT Goal Formulation: With patient Time For Goal Achievement: 05/15/20 Potential to Achieve Goals: Good Progress towards PT goals: Progressing toward goals    Frequency    Min 5X/week      PT Plan Discharge plan needs to be updated    Co-evaluation              AM-PAC PT "6 Clicks" Mobility   Outcome Measure  Help needed turning from your back to your side while in a flat bed without using bedrails?: None Help needed moving from lying on your back to sitting on the side of a flat bed without using bedrails?: None Help needed moving to and from a bed to a chair (including a wheelchair)?: None Help needed standing up from a chair using your arms (e.g., wheelchair or bedside chair)?: None Help needed to walk in hospital room?: None Help needed climbing 3-5 steps with a railing? : A Little 6 Click Score: 23    End of Session Equipment Utilized During Treatment: Gait belt Activity Tolerance: Patient tolerated  treatment well Patient left: in chair;with call bell/phone within reach Nurse Communication: Mobility status PT Visit Diagnosis: Muscle weakness (generalized) (M62.81);Pain;Unsteadiness on feet (R26.81) Pain - part of body:  (neck)     Time: 9628-3662 PT Time Calculation (min) (ACUTE ONLY): 23 min  Charges:  $Gait Training: 23-37 mins                     Rolinda Roan, PT, DPT Acute Rehabilitation Services Pager: [phone] Office: [phone]    Thelma Comp 05/04/2020, 11:01 AM

## 2020-05-04 NOTE — IPOC Note (Signed)
Individualized overall Plan of Care (IPOC) Patient Details Name: [name] [name] MRN: [id number] DOB: [birth year]  Admitting Diagnosis: Stenosis of cervical spine with myelopathy [facility])  Hospital Problems: Principal Problem:   Stenosis of cervical spine with myelopathy ([place]) Active Problems:   AKI (acute kidney injury) ([place])   Steroid-induced hyperglycemia   Controlled type 2 diabetes mellitus with hyperglycemia, without long-term current use of insulin ([facility])   Drug induced constipation   Hyponatremia   Hypoalbuminemia due to protein-calorie malnutrition ([facility])   Transaminitis   Leucocytosis   Acute on chronic anemia     Functional Problem List: Nursing Medication Management, Motor, Sensory, Pain, Safety, Skin Integrity, Nutrition  PT Balance, Endurance, Motor, Pain, Sensory  OT Balance, Endurance, Motor, Pain, Safety  SLP    TR         Basic ADL's: OT Grooming, Eating, Bathing, Dressing, Toileting     Advanced  ADL's: OT Simple Meal Preparation, Laundry     Transfers: PT Bed Mobility, Bed to Chair, Car, Furniture, Futures trader, Metallurgist: PT Ambulation, Emergency planning/management officer, Stairs     Additional Impairments: OT Fuctional Use of Upper Extremity  SLP        TR      Anticipated Outcomes Item Anticipated Outcome  Self Feeding Mod I  Swallowing      Basic self-care  Mod I  Toileting  Mod I   Bathroom Transfers Mod I  Bowel/Bladder  patient will remain continent during admisson to CIR  Transfers  Mod I  Locomotion  Mod I  Communication     Cognition     Pain  patients pain will be maintained to an acceptable level while on CIR  Safety/Judgment  patient will have no falls with injury while on CIR   Therapy Plan: PT Intensity: Minimum of 1-2 x/day ,45 to 90 minutes PT Frequency: 5 out of 7 days PT Duration Estimated Length of Stay: 7-10 OT Intensity: Minimum of 1-2 x/day, 45 to 90 minutes OT Frequency: 5 out of 7 days OT  Duration/Estimated Length of Stay: 6-9 days      Team Interventions: Nursing Interventions Patient/Family Education, Disease Management/Prevention, Medication Management, Pain Management, Skin Care/Wound Management, Discharge Planning, Psychosocial Support  PT interventions Ambulation/gait training, Discharge planning, Functional mobility training, Psychosocial support, Therapeutic Activities, Balance/vestibular training, Disease management/prevention, Neuromuscular re-education, Skin care/wound management, Therapeutic Exercise, Wheelchair propulsion/positioning, DME/adaptive equipment instruction, Pain management, Splinting/orthotics, UE/LE Strength taining/ROM, Community reintegration, Technical sales engineer stimulation, Barrister's clerk education, IT trainer, UE/LE Coordination activities  OT Interventions Training and development officer, Academic librarian, Discharge planning, Disease mangement/prevention, Engineer, drilling, Functional electrical stimulation, Functional mobility training, Neuromuscular re-education, Pain management, Patient/family education, Psychosocial support, Self Care/advanced ADL retraining, Skin care/wound managment, Therapeutic Activities, Therapeutic Exercise, UE/LE Strength taining/ROM, UE/LE Coordination activities  SLP Interventions    TR Interventions    SW/CM Interventions Discharge Planning, Psychosocial Support, Patient/Family Education   Barriers to Discharge MD  Medical stability  Nursing      PT Home environment access/layout, Decreased caregiver support, Lack of/limited family support 3 STE without HR, no SPV/assist available during the day since Dtr is at work  OT Decreased caregiver support    SLP      SW Decreased caregiver support Daughter works during the day   Team Discharge Planning: Destination: PT-Home ,OT- Home , SLP-  Projected Follow-up: PT-Home health PT, Outpatient PT, OT-  Outpatient OT, SLP-  Projected Equipment  Needs: PT- , OT- None recommended by OT, SLP-  Equipment Details: PT-Patient own FWW and SBQC, OT-  Patient/family involved in discharge planning: PT- Patient,  OT-Patient, SLP-   MD ELOS: 6-9 days. Medical Rehab Prognosis:  Good Assessment: 86 year old male with history of GERD, essential tremor, Covid 02/2020, gait disturbance with numbness bilateral hands due to HNP with spondylosis and severe canal stenosis C3-C6 and was admitted on 05/01/20 for posterior cervical arthrodesis C3-C6 by Dr. Ronnald Ramp. Post op with ataxic gait as well as LUE weakness. Decadron added due to concerns of C5 nerve palsy and follow up MRI C spine showed expected post op changes without hemorrhage and lack of canal stenosis C3-C6 with mild myelomalacia. Patient with resulting functional deficits with mobility, transfers, endurance, self-care.  Will set goals for Mod I with PT/OT.  Due to the current state of emergency, patients may not be receiving their 3-hours of Medicare-mandated therapy.  See Team Conference Notes for weekly updates to the plan of care

## 2020-05-05 ENCOUNTER — Inpatient Hospital Stay (HOSPITAL_COMMUNITY): Payer: Medicare Other | Admitting: Occupational Therapy

## 2020-05-05 ENCOUNTER — Inpatient Hospital Stay (HOSPITAL_COMMUNITY): Payer: Medicare Other

## 2020-05-05 DIAGNOSIS — D72828 Other elevated white blood cell count: Secondary | ICD-10-CM

## 2020-05-05 DIAGNOSIS — E1165 Type 2 diabetes mellitus with hyperglycemia: Secondary | ICD-10-CM

## 2020-05-05 DIAGNOSIS — I10 Essential (primary) hypertension: Secondary | ICD-10-CM

## 2020-05-05 DIAGNOSIS — R739 Hyperglycemia, unspecified: Secondary | ICD-10-CM

## 2020-05-05 DIAGNOSIS — N1832 Chronic kidney disease, stage 3b: Secondary | ICD-10-CM

## 2020-05-05 DIAGNOSIS — D649 Anemia, unspecified: Secondary | ICD-10-CM

## 2020-05-05 DIAGNOSIS — N179 Acute kidney failure, unspecified: Secondary | ICD-10-CM

## 2020-05-05 DIAGNOSIS — E8809 Other disorders of plasma-protein metabolism, not elsewhere classified: Secondary | ICD-10-CM

## 2020-05-05 DIAGNOSIS — K5903 Drug induced constipation: Secondary | ICD-10-CM

## 2020-05-05 DIAGNOSIS — E46 Unspecified protein-calorie malnutrition: Secondary | ICD-10-CM

## 2020-05-05 DIAGNOSIS — R7401 Elevation of levels of liver transaminase levels: Secondary | ICD-10-CM

## 2020-05-05 DIAGNOSIS — E871 Hypo-osmolality and hyponatremia: Secondary | ICD-10-CM

## 2020-05-05 DIAGNOSIS — R062 Wheezing: Secondary | ICD-10-CM

## 2020-05-05 DIAGNOSIS — T380X5A Adverse effect of glucocorticoids and synthetic analogues, initial encounter: Secondary | ICD-10-CM

## 2020-05-05 DIAGNOSIS — D72829 Elevated white blood cell count, unspecified: Secondary | ICD-10-CM

## 2020-05-05 LAB — GLUCOSE, CAPILLARY
Glucose-Capillary: 181 mg/dL — ABNORMAL HIGH (ref 70–99)
Glucose-Capillary: 166 mg/dL — ABNORMAL HIGH (ref 70–99)
Glucose-Capillary: 168 mg/dL — ABNORMAL HIGH (ref 70–99)
Glucose-Capillary: 183 mg/dL — ABNORMAL HIGH (ref 70–99)

## 2020-05-05 LAB — CBC WITH DIFFERENTIAL/PLATELET
Abs Immature Granulocytes: 0.11 10*3/uL — ABNORMAL HIGH (ref 0.00–0.07)
Basophils Absolute: 0 10*3/uL (ref 0.0–0.1)
Basophils Relative: 0 %
Eosinophils Absolute: 0 10*3/uL (ref 0.0–0.5)
Eosinophils Relative: 0 %
HCT: 34.5 % — ABNORMAL LOW (ref 39.0–52.0)
Hemoglobin: 11.9 g/dL — ABNORMAL LOW (ref 13.0–17.0)
Immature Granulocytes: 1 %
Lymphocytes Relative: 7 %
Lymphs Abs: 0.9 10*3/uL (ref 0.7–4.0)
MCH: 32.2 pg (ref 26.0–34.0)
MCHC: 34.5 g/dL (ref 30.0–36.0)
MCV: 93.5 fL (ref 80.0–100.0)
Monocytes Absolute: 0.5 10*3/uL (ref 0.1–1.0)
Monocytes Relative: 4 %
Neutro Abs: 11.8 10*3/uL — ABNORMAL HIGH (ref 1.7–7.7)
Neutrophils Relative %: 88 %
Platelets: 182 10*3/uL (ref 150–400)
RBC: 3.69 MIL/uL — ABNORMAL LOW (ref 4.22–5.81)
RDW: 15.3 % (ref 11.5–15.5)
WBC: 13.3 10*3/uL — ABNORMAL HIGH (ref 4.0–10.5)
nRBC: 0 % (ref 0.0–0.2)

## 2020-05-05 LAB — COMPREHENSIVE METABOLIC PANEL
ALT: 19 U/L (ref 0–44)
AST: 51 U/L — ABNORMAL HIGH (ref 15–41)
Albumin: 3.1 g/dL — ABNORMAL LOW (ref 3.5–5.0)
Alkaline Phosphatase: 49 U/L (ref 38–126)
Anion gap: 11 (ref 5–15)
BUN: 84 mg/dL — ABNORMAL HIGH (ref 8–23)
CO2: 20 mmol/L — ABNORMAL LOW (ref 22–32)
Calcium: 9.1 mg/dL (ref 8.9–10.3)
Chloride: 101 mmol/L (ref 98–111)
Creatinine, Ser: 2.06 mg/dL — ABNORMAL HIGH (ref 0.61–1.24)
GFR calc Af Amer: 32 mL/min — ABNORMAL LOW (ref >60)
GFR calc non Af Amer: 28 mL/min — ABNORMAL LOW (ref >60)
Glucose, Bld: 184 mg/dL — ABNORMAL HIGH (ref 70–99)
Potassium: 4.7 mmol/L (ref 3.5–5.1)
Sodium: 132 mmol/L — ABNORMAL LOW (ref 135–145)
Total Bilirubin: 1 mg/dL (ref 0.3–1.2)
Total Protein: 6.2 g/dL — ABNORMAL LOW (ref 6.5–8.1)

## 2020-05-05 MED ORDER — ALBUTEROL SULFATE HFA 108 (90 BASE) MCG/ACT IN AERS
1.0000 | INHALATION_SPRAY | Freq: Four times a day (QID) | RESPIRATORY_TRACT | Status: DC
  Administered 2020-05-05 – 2020-05-10 (×18): 1 via RESPIRATORY_TRACT
  Filled 2020-05-05: qty 6.7

## 2020-05-05 MED ORDER — SENNOSIDES-DOCUSATE SODIUM 8.6-50 MG PO TABS
2.0000 | ORAL_TABLET | Freq: Two times a day (BID) | ORAL | Status: DC
  Administered 2020-05-05 – 2020-05-17 (×25): 2 via ORAL
  Filled 2020-05-05 (×25): qty 2

## 2020-05-05 MED ORDER — POLYETHYLENE GLYCOL 3350 17 G PO PACK
17.0000 g | PACK | Freq: Two times a day (BID) | ORAL | Status: DC
  Administered 2020-05-05 – 2020-05-17 (×21): 17 g via ORAL
  Filled 2020-05-05 (×23): qty 1

## 2020-05-05 MED ORDER — ASPIRIN EC 81 MG PO TBEC
81.0000 mg | DELAYED_RELEASE_TABLET | Freq: Every day | ORAL | Status: DC
  Administered 2020-05-05 – 2020-05-17 (×13): 81 mg via ORAL
  Filled 2020-05-05 (×13): qty 1

## 2020-05-05 MED ORDER — SODIUM CHLORIDE 0.9 % IV BOLUS
1000.0000 mL | Freq: Once | INTRAVENOUS | Status: AC
  Administered 2020-05-05: 1000 mL via INTRAVENOUS

## 2020-05-05 MED ORDER — PROSOURCE PLUS PO LIQD
30.0000 mL | Freq: Two times a day (BID) | ORAL | Status: DC
  Administered 2020-05-05 – 2020-05-16 (×21): 30 mL via ORAL
  Filled 2020-05-05 (×19): qty 30

## 2020-05-05 NOTE — Evaluation (Signed)
Physical Therapy Assessment and Plan  Patient Details  Name: [name] [name] MRN: [id number] Date of Birth: Oct 28, 1928  PT Diagnosis: Abnormal posture, Abnormality of gait, Difficulty walking, Hypotonia and Muscle weakness Rehab Potential: Good ELOS: 7-10 days  Today's Date: 05/05/2020 PT Individual Time: 9798-9211 PT Individual Time Calculation (min): 58 min    Hospital Problem: Principal Problem:   Stenosis of cervical spine with myelopathy [facility])   Past Medical History:  Past Medical History:  Diagnosis Date  . Acute renal failure ([place]) 12/06/2013  . Back pain    occasionally  . Benign essential tremor 2005   takes Primidone daily  . Choledocholithiasis 12/06/2013  . Constipation    takes OTC stool softener  . Coronary artery disease 2007   s/p CABG, DR Johnsie Cancel  . GERD (gastroesophageal reflux disease)    takes Nexium daily  . Gout 1995   "~ once/yr" (03/17/2014)  . History of colon polyps   . Hyperlipidemia 12/2005   takes Atorvastatin daily  . Hypertension 1995   takes Lisinopril and Coreg daily  . possible HCAP (healthcare-associated pneumonia) 12/09/2013  . Prostate cancer ([place]) 2007   S/P treatment, followed by Urology prev  . Right bundle branch block (RBBB)   . Stenosis of cervical spine   . Type II diabetes mellitus (HCC)    no meds;diet and exercise controlled   . Wears dentures   . Wears glasses    Past Surgical History:  Past Surgical History:  Procedure Laterality Date  . Adenosine myoview  01/24/2006   Ischemia by EKG, Cath  . CATARACT EXTRACTION W/ INTRAOCULAR LENS IMPLANT Left 10/2013  . CHOLECYSTECTOMY N/A 12/06/2013   Procedure: Diagnostic Laparoscopy for  drainage Intrabdominal abcess;  Surgeon: Rolm Bookbinder, MD;  Location: [place];  Service: General;  Laterality: N/A;  . CHOLECYSTECTOMY N/A 03/17/2014   Procedure: LAPAROSCOPIC CHOLECYSTECTOMY ;  Surgeon: Rolm Bookbinder, MD;  Location: [place];  Service: General;  Laterality: N/A;  .  COLONOSCOPY    . CORONARY ARTERY BYPASS GRAFT  2007   CABGX 4  . CYSTOSCOPY  02/15/2004   Mod BPH, moder tribec ?  Marland Kitchen ERCP N/A 12/06/2013   Procedure: ENDOSCOPIC RETROGRADE CHOLANGIOPANCREATOGRAPHY (ERCP);  Surgeon: Milus Banister, MD;  Location: [place];  Service: Endoscopy;  Laterality: N/A;  . LAPAROSCOPIC CHOLECYSTECTOMY  03/17/2014  . LUMBAR FUSION  02/2012   lumbar spine for spinal stenosis  . POSTERIOR CERVICAL FUSION/FORAMINOTOMY N/A 05/01/2020   Procedure: Posterior cervical fusion with lateral mass fixation - Cervical three - Cervical six, laminectomy Cervical three-six;  Surgeon: Eustace Moore, MD;  Location: [place];  Service: Neurosurgery;  Laterality: N/A;  . PROSTATE CRYOABLATION  09/18/2005   prostate CA    Assessment & Plan Clinical Impression: Patient is a 86 y.o. year old male with history of GERD, essential tremor, Covid 02/2020, gait disturbance with numbness bilateral hands due to HNP with spondylosis and severe canal stenosis C3-C6 and was admitted on 05/01/20 for posterior cervical arthrodesis C3-C6 by Dr. Ronnald Ramp. Post op with ataxic gait as well as LUE weakness. Decadron added due to concerns of C5 nerve palsy and follow up MRI C spine showed expected post op changes without hemorrhage and lack of canal stenosis C3-C6 with mild myelomalacia. history of GERD, essential tremor, Covid 02/2020, gait disturbance with numbness bilateral hands due to HNP with spondylosis and severe canal stenosis C3-C6 and was admitted on 05/01/20 for posterior cervical arthrodesis C3-C6 by Dr. Ronnald Ramp. Post op with ataxic gait as  well as LUE weakness. Decadron added due to concerns of C5 nerve palsy and follow up MRI C spine showed expected post op changes without hemorrhage and lack of canal stenosis C3-C6 with mild myelomalacia. Transferred to CIR on 05/04/2020 .   Patient currently requires min with mobility secondary to muscle weakness, muscle joint tightness and muscle paralysis, decreased  cardiorespiratoy endurance, abnormal tone and decreased standing balance and decreased balance strategies.  Prior to hospitalization, patient was modified independent  with mobility and lived with   in a House home.  Home access is 3Stairs to enter.  Patient will benefit from skilled PT intervention to maximize safe functional mobility, minimize fall risk and decrease caregiver burden for planned discharge home with intermittent assist.  Anticipate patient will benefit from follow up [facility] at discharge.  PT Assessment PT Barriers to Discharge: Home environment access/layout PT Patient demonstrates impairments in the following area(s): Balance;Endurance;Motor;Pain;Sensory PT Transfers Functional Problem(s): Bed Mobility;Bed to Chair;Car;Furniture PT Locomotion Functional Problem(s): Ambulation;Wheelchair Mobility;Stairs PT Plan PT Intensity: Minimum of 1-2 x/day ,45 to 90 minutes PT Frequency: 5 out of 7 days PT Treatment/Interventions: Ambulation/gait training;Discharge planning;Functional mobility training;Psychosocial support;Therapeutic Activities;Balance/vestibular training;Disease management/prevention;Neuromuscular re-education;Skin care/wound management;Therapeutic Exercise;Wheelchair propulsion/positioning;DME/adaptive equipment instruction;Pain management;Splinting/orthotics;UE/LE Strength taining/ROM;Community reintegration;Functional electrical stimulation;Patient/family education;Stair training;UE/LE Coordination activities PT Recommendation Follow Up Recommendations: Home health PT Patient destination: Home   PT Evaluation Precautions/Restrictions Precautions Precautions: Cervical;Fall Restrictions Weight Bearing Restrictions: No Pain Pain Assessment Pain Scale: 0-10 Pain Score: 0-No pain Pain Type: Acute pain;Surgical pain Pain Location: Back Pain Orientation: Upper;Mid Pain Descriptors / Indicators: Aching Pain Frequency: Intermittent Pain Onset: On-going Patients Stated  Pain Goal: 3 Pain Intervention(s): Medication (See eMAR) (tylenol given) Home Living/Prior Functioning Home Living Available Help at Discharge: Family;Available PRN/intermittently Type of Home: House Home Access: Stairs to enter [employer] of Steps: 3 Entrance Stairs-Rails: None Home Layout: One level Bathroom Shower/Tub: Optometrist: Yes Prior Function Comments: uses quad cane for mobility, independent ADLs  Cognition Overall Cognitive Status: Within Functional Limits for tasks assessed Orientation Level: Oriented X4  Perception  WFL Sensation Sensation Light Touch: Appears Intact Proprioception: Appears Intact Coordination Gross Motor Movements are Fluid and Coordinated: No Fine Motor Movements are Fluid and Coordinated: No Coordination and Movement Description: B LE intact, L UE decreased d/t C5 nerve palsy Finger Nose Finger Test: intact on Rt, unable on Lt due to decreased bicep activation Heel Shin Test: Intact B LE Motor  Motor Motor: Other (comment) (L UE with trace bicep activation)  Trunk/Postural Assessment  Cervical Assessment Cervical Assessment: Exceptions to [facility] (fwd flexed posture maintained d/t cervical fusion) Thoracic Assessment Thoracic Assessment: Exceptions to [facility] (increased kyphosis) Lumbar Assessment Lumbar Assessment: Exceptions to [facility] (posterior pelvic tilt) Postural Control Postural Control: Deficits on evaluation Righting Reactions: delayed Protective Responses: delayed Postural Limitations: Limited in reaching outside BOS toward L  Balance Balance Balance Assessed: Yes Dynamic Sitting Balance Sitting balance - Comments: Patient able to maintain appropriate posture and balance seated without back support or UE support Dynamic Standing Balance Dynamic Standing - Comments: up to [place] with B UE on FWW for dynamic standing Extremity Assessment    RLE Assessment RLE  Assessment: Exceptions to [facility] RLE Strength Right Hip Flexion: 3+/5 Right Hip ABduction: 4-/5 Right Hip ADduction: 4-/5 Right Knee Flexion: 3+/5 Right Knee Extension: 4-/5 Right Ankle Dorsiflexion: 4/5 Right Ankle Plantar Flexion: 4/5 LLE Assessment LLE Assessment: Exceptions to [facility] LLE Strength Left Hip Flexion: 3-/5 Left Hip ABduction: 3+/5 Left Hip ADduction:  3+/5 Left Knee Flexion: 4-/5 Left Knee Extension: 4/5 Left Ankle Dorsiflexion: 4/5 Left Ankle Plantar Flexion: 4/5  Care Tool Care Tool Bed Mobility Roll left and right activity   Roll left and right assist level: Supervision/Verbal cueing    Sit to lying activity   Sit to lying assist level: Supervision/Verbal cueing    Lying to sitting edge of bed activity   Lying to sitting edge of bed assist level: Supervision/Verbal cueing     Care Tool Transfers Sit to stand transfer   Sit to stand assist level: Contact Guard/Touching assist    Chair/bed transfer   Chair/bed transfer assist level: Contact Guard/Touching assist     Toilet transfer   Assist Level: Contact Guard/Touching assist    Car transfer   Car transfer assist level: Minimal Assistance - Patient > 75%      Care Tool Locomotion Ambulation   Assist level: Contact Guard/Touching assist      Walk 10 feet activity   Assist level: Contact Guard/Touching assist     Walk 50 feet with 2 turns activity   Assist level: Contact Guard/Touching assist    Walk 150 feet activity   Assist level: Contact Guard/Touching assist    Walk 10 feet on uneven surfaces activity   Assist level: Contact Guard/Touching assist    Stairs   Assist level: Moderate Assistance - Patient - 50 - 74%      Walk up/down 1 step activity   Walk up/down 1 step (curb) assist level: Minimal Assistance - Patient > 75%      Walk up/down 4 steps activity Walk up/down 4 steps assist level: Moderate Assistance - Patient - 50 - 74%    Walk up/down 12 steps activity   Walk  up/down 12 steps assist level: Moderate Assistance - Patient - 50 - 74%    Pick up small objects from floor Pick up small object from the floor (from standing position) activity did not occur: Safety/medical concerns (d/t patient weakness/fatigue/balance)      Wheelchair Will patient use wheelchair at discharge?: No          Wheel 50 feet with 2 turns activity      Wheel 150 feet activity        Refer to Care Plan for Long Term Goals  SHORT TERM GOAL WEEK 1 PT Short Term Goal 1 (Week 1): STG= LTG due to ELOS  Recommendations for other services: None   Skilled Therapeutic Intervention Mobility Bed Mobility Bed Mobility: Rolling Right;Right Sidelying to Sit Rolling Right: Supervision/verbal cueing Right Sidelying to Sit: Contact Guard/Touching assist Transfers Transfers: Sit to Stand;Stand Pivot Transfers Sit to Stand: Contact Guard/Touching assist Stand to Sit: Contact Guard/Touching assist Stand Pivot Transfers: Contact Guard/Touching assist Transfer (Assistive device): Rolling walker Locomotion  Gait Ambulation: Yes Gait Assistance: Contact Guard/Touching assist Gait Distance (Feet): 250 Feet Assistive device: Rolling walker Gait Gait: Yes Gait Pattern: Impaired Gait Pattern: Right foot flat;Decreased trunk rotation;Wide base of support;Left foot flat;Shuffle;Step-through pattern;Decreased stride length;Decreased step length - right;Right flexed knee in stance;Left flexed knee in stance;Poor foot clearance - left;Poor foot clearance - right Gait velocity: decreased Stairs / Additional Locomotion Stairs: Yes Stairs Assistance: Moderate Assistance - Patient 50 - 74% Stair Management Technique: Two rails Number of Stairs: 12 Ramp: Contact Guard/touching assist Curb: Moderate Assistance - Patient 50 - 74% Wheelchair Mobility Wheelchair Mobility: No  Patient received sitting up in recliner agreeable to PT. He declines pain at this moment, but does state that he  has  to use restroom. Patient currently with IV fluids running. He was able to ambulate to bathroom using FWW and CGA. Intermittent minor LOB noted throughout ambulation, which patient was able to correct himself. Patient currently with limited L bicep activation requiring up to MinA for clothing management with toileting. Fair+ dynamic standing balance noted with no UE support, Good dynamic standing balance noted with UE support on FWW. Patient does demonstrate limited B LE foot clearance through swing phase. Patient does demonstrate increased B knee flexion through stance phase, but no evidence of buckling noted. Patient able to complete 5xSTS in 54.62s, which indicates increased risk for falling. He was able to complete this with intermittent R UE support. Patient attempting to transfer into car by balancing on R LE and advancing L LE into car. He did this requiring MinA, but was receptive to education regarding safe transfer technique. Patient able to ambulate >282f back to room from therapy gym. Patient educated on CIR PT POC, rehab expectations and 3hr rule, conference date and dc planning. Patient remaining up in recliner with seatbelt alarm in place, call light within reach.   Discharge Criteria: Patient will be discharged from PT if patient refuses treatment 3 consecutive times without medical reason, if treatment goals not met, if there is a change in medical status, if patient makes no progress towards goals or if patient is discharged from hospital.  The above assessment, treatment plan, treatment alternatives and goals were discussed and mutually agreed upon: by patient  JDebbora Dus8/27/2021, 7:38 AM

## 2020-05-05 NOTE — Progress Notes (Addendum)
[facility] PHYSICAL MEDICINE & REHABILITATION PROGRESS NOTE  Subjective/Complaints: Patient seen laying in bed this AM.  He states he slept fairly overnight due to receiving and enema.  He is ready to being therapies.    ROS: Denies CP, SOB, N/V/D  Objective: Vital Signs: Blood pressure (!) 157/71, pulse 72, temperature 98.3 F (36.8 C), resp. rate 16, height 5\' 11"  (1.803 m), weight 86.3 kg, SpO2 100 %. No results found. Recent Labs    05/05/20 0557  WBC 13.3*  HGB 11.9*  HCT 34.5*  PLT 182   Recent Labs    05/05/20 0557  NA 132*  K 4.7  CL 101  CO2 20*  GLUCOSE 184*  BUN 84*  CREATININE 2.06*  CALCIUM 9.1    Physical Exam: BP (!) 157/71   Pulse 72   Temp 98.3 F (36.8 C)   Resp 16   Ht 5\' 11"  (1.803 m)   Wt 86.3 kg   SpO2 100%   BMI 26.54 kg/m  Constitutional: No distress . Vital signs reviewed. HENT: Normocephalic.  Atraumatic. Eyes: EOMI. No discharge. Cardiovascular: No JVD.  RRR. Respiratory: Normal effort.  No stridor.  + Wheezing. GI: Distended.  BS +.  Skin: Warm and dry.  Incision with dressing CDI Psych: Normal mood.  Normal behavior. Musc: No edema in extremities.  No tenderness in extremities. Neuro: Alert Motor: RUE/RLE: 5/5 proximal distal RLE: 4+/5 proximal distally LUE: Shoulder abduction 2/5, elbow flexion/extension 3/5, handgrip 4+/5  Assessment/Plan: 1. Functional deficits secondary to C5 nerve root palsy status post C3-C6 posterior fusion which require 3+ hours per day of interdisciplinary therapy in a comprehensive inpatient rehab setting.  Physiatrist is providing close team supervision and 24 hour management of active medical problems listed below.  Physiatrist and rehab team continue to assess barriers to discharge/monitor patient progress toward functional and medical goals  Care Tool:  Bathing              Bathing assist       Upper Body Dressing/Undressing Upper body dressing   What is the patient wearing?:  Hospital gown only    Upper body assist Assist Level: Minimal Assistance - Patient > 75%    Lower Body Dressing/Undressing Lower body dressing      What is the patient wearing?: Incontinence brief     Lower body assist Assist for lower body dressing: Moderate Assistance - Patient 50 - 74%     Toileting Toileting    Toileting assist Assist for toileting: Minimal Assistance - Patient > 75%     Transfers Chair/bed transfer  Transfers assist           Locomotion Ambulation   Ambulation assist              Walk 10 feet activity   Assist           Walk 50 feet activity   Assist           Walk 150 feet activity   Assist           Walk 10 feet on uneven surface  activity   Assist           Wheelchair     Assist               Wheelchair 50 feet with 2 turns activity    Assist            Wheelchair 150 feet activity     Assist  Medical Problem List and Plan: 1.  Impaired function/C5 nerve root dyspraxia/palsy s/p C3-C6 posterior fusion with LUE weakness/N/T and impaired balance, gait and LUE movement  Begin CIR evaluations  Will plan to wean steroids next week  Neurosurg notes reviewed - okay to restart ASA 2.  Antithrombotics: -DVT/anticoagulation:  Mechanical: Sequential compression devices, below knee Bilateral lower extremities             -antiplatelet therapy: Aspirin restarted 3. Pain Management:  Changed Oxy to Norco and robaxin as needed  Monitor with increased exertion 4. Mood: LCSW to follow for evaluation and support.              -antipsychotic agents: N/A 5. Neuropsych: This patient is capable of making decisions on his own behalf. 6. Skin/Wound Care: Routine pressure relief measures.  7. Fluids/Electrolytes/Nutrition: Monitor I/Os.  8. HTN: Monitor BP --continue coreg and Lisinopril daily.  Moderate increased mobility 9. BPH: On Flomax daily.   Monitor for  retention 10.  AKI on CKD:   Cr. 2.06 on 8/27, severely elevated BUN   Labs ordered for Monday, echo reviewed from 2007- normal EF  NaCL fluid bolus ordered  Encourage fluids 11. Steroid induced hyperglycemia on T2DM with hyperglycemia: Diet controlled. Monitor with ac/hs and use SSI for elevated BS especially as on steroids.   Monitor increase mobility 12. Drug induced constipation: Continue senna S.  Added miralax also and order enema tonight.   Bowel meds increased again on 8/27 13. Hyponatremia  Na 132 on 8/27, labs ordered for Monday 14.  Hypoalbuminemia  Supplement initiated on 8/27 15.  Transaminitis  AST elevated on 8/27, labs ordered for Monday 13.  Leukocytosis-likely secondary to steroids  WBC 13.3 on 8/27  Afebrile  Continue to monitor 14. ?Acute on Anemia of chronic disease   Hb 11.9 on 8/27  Labs ordered for Monday 15.  Wheezing-patient states baseline, states he did not take any medications at home for this  Chest x-ray ordered  As needed albuterol   LOS: 1 days A FACE TO FACE EVALUATION WAS PERFORMED  [name] Lorie Phenix 05/05/2020, 10:23 AM

## 2020-05-05 NOTE — Progress Notes (Signed)
Patient ID: [name] [name], male   DOB: 05/09/1929, 86 y.o.   MRN: [id number] Met with the patient to review role of the nurse CM and focus on nursing issues specifically HLD/triglyceride diet education, protein deficiency, transaminitis, constipation and pain control. Reviewed elevated BUN and order per Md to enc fluids with IVF bolus. Reviewed CMM diet and protein rich, low triglyceride foods and increasing fluids. Patient reports he does not drink sodas however does use honey to sweeten his coffee and non-dairy creamers and drinks a lot of milk. Discussed use crystal light or similar for water or plain water as juices have sugar as well. Little pain in his neck and small return of function in arm. Continue to follow along with patient case. Sharp, Deborah B  

## 2020-05-05 NOTE — Evaluation (Signed)
Occupational Therapy Assessment and Plan  Patient Details  Name: [name] [name] MRN: [id number] Date of Birth: [birth year]  OT Diagnosis: acute pain, monoplegia of upper limn affecting non-dominant side and muscle weakness (generalized) Rehab Potential: Rehab Potential (ACUTE ONLY): Good ELOS: 6-9 days   Today's Date: 05/05/2020 OT Individual Time: 1033-1130 OT Individual Time Calculation (min): 2 min     Hospital Problem: Principal Problem:   Stenosis of cervical spine with myelopathy ([place]) Active Problems:   AKI (acute kidney injury) ([place])   Steroid-induced hyperglycemia   Controlled type 2 diabetes mellitus with hyperglycemia, without long-term current use of insulin (HCC)   Drug induced constipation   Hyponatremia   Hypoalbuminemia due to protein-calorie malnutrition (HCC)   Transaminitis   Leucocytosis   Acute on chronic anemia   Past Medical History:  Past Medical History:  Diagnosis Date  . Acute renal failure ([place]) 12/06/2013  . Back pain    occasionally  . Benign essential tremor 2005   takes Primidone daily  . Choledocholithiasis 12/06/2013  . Constipation    takes OTC stool softener  . Coronary artery disease 2007   s/p CABG, DR Johnsie Cancel  . GERD (gastroesophageal reflux disease)    takes Nexium daily  . Gout 1995   "~ once/yr" (03/17/2014)  . History of colon polyps   . Hyperlipidemia 12/2005   takes Atorvastatin daily  . Hypertension 1995   takes Lisinopril and Coreg daily  . possible HCAP (healthcare-associated pneumonia) 12/09/2013  . Prostate cancer ([place]) 2007   S/P treatment, followed by Urology prev  . Right bundle branch block (RBBB)   . Stenosis of cervical spine   . Type II diabetes mellitus (HCC)    no meds;diet and exercise controlled   . Wears dentures   . Wears glasses    Past Surgical History:  Past Surgical History:  Procedure Laterality Date  . Adenosine myoview  01/24/2006   Ischemia by EKG, Cath  . CATARACT EXTRACTION W/  INTRAOCULAR LENS IMPLANT Left 10/2013  . CHOLECYSTECTOMY N/A 12/06/2013   Procedure: Diagnostic Laparoscopy for  drainage Intrabdominal abcess;  Surgeon: Rolm Bookbinder, MD;  Location: [place];  Service: General;  Laterality: N/A;  . CHOLECYSTECTOMY N/A 03/17/2014   Procedure: LAPAROSCOPIC CHOLECYSTECTOMY ;  Surgeon: Rolm Bookbinder, MD;  Location: [place];  Service: General;  Laterality: N/A;  . COLONOSCOPY    . CORONARY ARTERY BYPASS GRAFT  2007   CABGX 4  . CYSTOSCOPY  02/15/2004   Mod BPH, moder tribec ?  Marland Kitchen ERCP N/A 12/06/2013   Procedure: ENDOSCOPIC RETROGRADE CHOLANGIOPANCREATOGRAPHY (ERCP);  Surgeon: Milus Banister, MD;  Location: [place];  Service: Endoscopy;  Laterality: N/A;  . LAPAROSCOPIC CHOLECYSTECTOMY  03/17/2014  . LUMBAR FUSION  02/2012   lumbar spine for spinal stenosis  . POSTERIOR CERVICAL FUSION/FORAMINOTOMY N/A 05/01/2020   Procedure: Posterior cervical fusion with lateral mass fixation - Cervical three - Cervical six, laminectomy Cervical three-six;  Surgeon: Eustace Moore, MD;  Location: [place];  Service: Neurosurgery;  Laterality: N/A;  . PROSTATE CRYOABLATION  09/18/2005   prostate CA    Assessment & Plan Clinical Impression: Patient is a 86 y.o. year old male with history of GERD, essential tremor, Covid 02/2020, gait disturbance with numbness bilateral hands due to HNP with spondylosis and severe canal stenosis C3-C6 and was admitted on 05/01/20 for posterior cervical arthrodesis C3-C6 by Dr. Ronnald Ramp. Post op with ataxic gait as well as LUE weakness. Decadron added due to concerns of C5 nerve  palsy and follow up MRI C spine showed expected post op changes without hemorrhage and lack of canal stenosis C3-C6 with mild myelomalacia. Therapy ongoing and CIR recommended due to functional decline.   Patient transferred to CIR on 05/04/2020 .    Patient currently requires mod with basic self-care skills secondary to muscle weakness, decreased cardiorespiratoy endurance, unbalanced  muscle activation and decreased standing balance and decreased balance strategies.  Prior to hospitalization, patient could complete ADLs with independent .  Patient will benefit from skilled intervention to increase independence with basic self-care skills prior to discharge home with care partner.  Anticipate patient will require intermittent supervision and follow up outpatient.  OT - End of Session Activity Tolerance: Tolerates 30+ min activity with multiple rests Endurance Deficit: Yes Endurance Deficit Description: pt required frequent rest breaks OT Assessment Rehab Potential (ACUTE ONLY): Good OT Barriers to Discharge: Decreased caregiver support OT Patient demonstrates impairments in the following area(s): Balance;Endurance;Motor;Pain;Safety OT Basic ADL's Functional Problem(s): Grooming;Eating;Bathing;Dressing;Toileting OT Advanced ADL's Functional Problem(s): Simple Meal Preparation;Laundry OT Transfers Functional Problem(s): Toilet;Tub/Shower OT Additional Impairment(s): Fuctional Use of Upper Extremity OT Plan OT Intensity: Minimum of 1-2 x/day, 45 to 90 minutes OT Frequency: 5 out of 7 days OT Duration/Estimated Length of Stay: 6-9 days OT Treatment/Interventions: Balance/vestibular training;Community reintegration;Discharge planning;Disease mangement/prevention;DME/adaptive equipment instruction;Functional electrical stimulation;Functional mobility training;Neuromuscular re-education;Pain management;Patient/family education;Psychosocial support;Self Care/advanced ADL retraining;Skin care/wound managment;Therapeutic Activities;Therapeutic Exercise;UE/LE Strength taining/ROM;UE/LE Coordination activities OT Self Feeding Anticipated Outcome(s): Mod I OT Basic Self-Care Anticipated Outcome(s): Mod I OT Toileting Anticipated Outcome(s): Mod I OT Bathroom Transfers Anticipated Outcome(s): Mod I OT Recommendation Patient destination: Home Follow Up Recommendations: Outpatient  OT Equipment Recommended: None recommended by OT   OT Evaluation Precautions/Restrictions  Precautions Precautions: Cervical;Fall Restrictions Weight Bearing Restrictions: No Pain Pain Assessment Pain Scale: 0-10 Pain Score: 4  Pain Type: Acute pain;Surgical pain Pain Location: Back Pain Descriptors / Indicators: Aching Pain Intervention(s):  (premedicated) Home Living/Prior Functioning Home Living Family/patient expects to be discharged to:: Private residence Living Arrangements: Children Available Help at Discharge: Family, Available PRN/intermittently Type of Home: House Home Access: Stairs to enter Technical brewer of Steps: 3 Entrance Stairs-Rails: None Home Layout: One level Bathroom Shower/Tub: Public librarian, Multimedia programmer: Programmer, systems: Yes  Lives With: Daughter IADL History Homemaking Responsibilities: Yes Meal Prep Responsibility: No Laundry Responsibility: Marine scientist Responsibility: Secondary Current License: Yes Prior Function Level of Independence: Independent with basic ADLs, Independent with homemaking with ambulation, Requires assistive device for independence  Able to Take Stairs?: Yes Driving: Yes Comments: uses quad cane for mobility does occasionally walk around home without AD, independent ADLs Vision Baseline Vision/History: Wears glasses Wears Glasses: At all times Patient Visual Report: No change from baseline Vision Assessment?: No apparent visual deficits Perception  Perception: Within Functional Limits Praxis Praxis: Intact Cognition Overall Cognitive Status: Within Functional Limits for tasks assessed Arousal/Alertness: Awake/alert Orientation Level: Person;Place;Situation Person: Oriented Place: Oriented Situation: Oriented Year: 2021 Month: August Day of Week: Correct Memory: Appears intact Immediate Memory Recall: Sock;Blue;Bed Memory Recall Sock: With Cue (initially stated  Red, blue, bed but when given cue, quickly stated sock) Memory Recall Blue: Without Cue Memory Recall Bed: Without Cue Attention: Selective Selective Attention: Appears intact Awareness: Appears intact Problem Solving: Appears intact Safety/Judgment: Appears intact Sensation Sensation Light Touch: Appears Intact (numbness/tingling in finger tips and toes, reports improved since surgery) Proprioception: Appears Intact Coordination Gross Motor Movements are Fluid and Coordinated: No Fine Motor Movements are Fluid and Coordinated: No Coordination and Movement  Description: decreased functional use of LUE due to trace bicep activation Finger Nose Finger Test: intact on Rt, unable on Lt due to decreased bicep activation Motor  Motor Motor: Other (comment) (L UE with trace bicep activation)  Trunk/Postural Assessment  Cervical Assessment Cervical Assessment: Exceptions to [facility] (fwd flexed posture maintained d/t cervical fusion) Thoracic Assessment Thoracic Assessment: Exceptions to [facility] (increased kyphosis) Lumbar Assessment Lumbar Assessment: Exceptions to [facility] (posterior pelvic tilt) Postural Control Postural Control: Deficits on evaluation Righting Reactions: delayed Protective Responses: delayed Postural Limitations: Limited in reaching outside BOS toward L  Balance Dynamic Sitting Balance Sitting balance - Comments: pt able to sit EOB without UE support Dynamic Standing Balance Dynamic Standing - Comments: CGA for dynamic standing during LB dressing Extremity/Trunk Assessment RUE Assessment RUE Assessment: Within Functional Limits General Strength Comments: grossly 5/5 LUE Assessment LUE Assessment: Exceptions to [facility] Passive Range of Motion (PROM) Comments: WNL General Strength Comments: biceps 0-1/5, Triceps 4+/5, WE 4+/5, grip 5-/5 and finger abd 5-/5  Care Tool Care Tool Self Care Eating        Oral Care    Oral Care Assist Level: Contact Guard/Toucning assist     Bathing   Body parts bathed by patient: Left arm;Chest;Abdomen;Front perineal area;Right upper leg;Left upper leg;Face Body parts bathed by helper: Right arm;Buttocks;Right lower leg;Left lower leg   Assist Level: Moderate Assistance - Patient 50 - 74%    Upper Body Dressing(including orthotics)   What is the patient wearing?: Pull over shirt   Assist Level: Moderate Assistance - Patient 50 - 74%    Lower Body Dressing (excluding footwear)   What is the patient wearing?: Underwear/pull up;Pants Assist for lower body dressing: Maximal Assistance - Patient 25 - 49%    Putting on/Taking off footwear   What is the patient wearing?: Non-skid slipper socks Assist for footwear: Maximal Assistance - Patient 25 - 49%       Care Tool Toileting Toileting activity   Assist for toileting: Moderate Assistance - Patient 50 - 74%     Care Tool Bed Mobility Roll left and right activity   Roll left and right assist level: Supervision/Verbal cueing    Sit to lying activity   Sit to lying assist level: Supervision/Verbal cueing    Lying to sitting edge of bed activity   Lying to sitting edge of bed assist level: Supervision/Verbal cueing     Care Tool Transfers Sit to stand transfer   Sit to stand assist level: Contact Guard/Touching assist    Chair/bed transfer   Chair/bed transfer assist level: Minimal Assistance - Patient > 75%     Toilet transfer   Assist Level: Minimal Assistance - Patient > 75%     Care Tool Cognition Expression of Ideas and Wants Expression of Ideas and Wants: Without difficulty (complex and basic) - expresses complex messages without difficulty and with speech that is clear and easy to understand   Understanding Verbal and Non-Verbal Content Understanding Verbal and Non-Verbal Content: Understands (complex and basic) - clear comprehension without cues or repetitions   Memory/Recall Ability *first 3 days only Memory/Recall Ability *first 3 days only: Current  season;Location of own room;Staff names and faces;That he or she is in a hospital/hospital unit    Refer to Care Plan for [place] 1 OT Short Term Goal 1 (Week 1): STG = LTGs due to ELOS  Recommendations for other services: None    Skilled Therapeutic Intervention OT eval completed with  discussion of rehab process, OT purpose, POC, ELOS, and goals.  ADL assessment completed with focus on functional transfers, dynamic standing balance, and use of LUE during self-care tasks.  Pt received supine in bed agreeable to session.  Pt completed bed mobility with log roll and then pushing up to sitting with CGA.  Completed sit > stand and stand pivot transfers without AD with Min assist.  Pt utilizing RUE throughout session to place LUE on to w/c arm rest, RW, and counter due to trace bicep activation.  Engaged in bathing and dressing at sit > stand level at sink while educating on techniques to increase independence and compensatory strategies for decreased functional use of LUE.  Pt required increased assistance with LB dressing due to inability to fasten button, zipper, or belt buckle without additional assistance.  Discussed use of elastic waist pants initially to decrease burden and frustration with clothing management.  Pt required CGA for standing balance due to decreased balance reactions. Ambulated with quad cane and RW, recommending RW at this time to allow for support for LUE during mobility with pt reporting understanding.  Pt transferred to recliner and left upright with seat belt alarm on and all needs in reach.   ADL ADL Eating: Set up Where Assessed-Eating: Chair Grooming: Setup Where Assessed-Grooming: Sitting at sink Upper Body Bathing: Minimal assistance Where Assessed-Upper Body Bathing: Sitting at sink Lower Body Bathing: Minimal assistance Where Assessed-Lower Body Bathing: Sitting at sink;Standing at sink Upper Body Dressing: Minimal assistance Where  Assessed-Upper Body Dressing: Sitting at sink Lower Body Dressing: Moderate assistance Where Assessed-Lower Body Dressing: Sitting at sink;Standing at sink Toilet Transfer: Minimal assistance Toilet Transfer Method: Ambulating Mobility  Bed Mobility Bed Mobility: Rolling Right;Right Sidelying to Sit Rolling Right: Supervision/verbal cueing Right Sidelying to Sit: Contact Guard/Touching assist Transfers Sit to Stand: Contact Guard/Touching assist Stand to Sit: Contact Guard/Touching assist   Discharge Criteria: Patient will be discharged from OT if patient refuses treatment 3 consecutive times without medical reason, if treatment goals not met, if there is a change in medical status, if patient makes no progress towards goals or if patient is discharged from hospital.  The above assessment, treatment plan, treatment alternatives and goals were discussed and mutually agreed upon: by patient  [name] 05/05/2020, 3:46 PM

## 2020-05-05 NOTE — Progress Notes (Signed)
[facility] Individual Statement of Services  Patient Name:  [name] [name]  Date:  05/05/2020  Welcome to the [facility].  Our goal is to provide you with an individualized program based on your diagnosis and situation, designed to meet your specific needs.  With this comprehensive rehabilitation program, you will be expected to participate in at least 3 hours of rehabilitation therapies Monday-Friday, with modified therapy programming on the weekends.  Your rehabilitation program will include the following services:  Physical Therapy (PT), Occupational Therapy (OT), 24 hour per day rehabilitation nursing, Neuropsychology, Care Coordinator, Rehabilitation Medicine, Nutrition Services and Pharmacy Services  Weekly team conferences will be held on Wednesday to discuss your progress.  Your Inpatient Rehabilitation Care Coordinator will talk with you frequently to get your input and to update you on team discussions.  Team conferences with you and your family in attendance may also be held.  Expected length of stay: 7-10 days  Overall anticipated outcome: independent with device  Depending on your progress and recovery, your program may change. Your Inpatient Rehabilitation Care Coordinator will coordinate services and will keep you informed of any changes. Your Inpatient Rehabilitation Care Coordinator's name and contact numbers are listed  below.  The following services may also be recommended but are not provided by the [facility]:    [place] will be made to provide these services after discharge if needed.  Arrangements include referral to agencies that provide these services.  Your insurance has been verified to be:  Medicare & tricare Your primary doctor is:  Elsie Stain  Pertinent information will be shared with your doctor and your  insurance company.  Inpatient Rehabilitation Care Coordinator:  Ovidio Kin, [place] or Emilia Beck  Information discussed with and copy given to patient by: Elease Hashimoto, 05/05/2020, 9:21 AM

## 2020-05-05 NOTE — Progress Notes (Signed)
Occupational Therapy Session Note  Patient Details  Name: [name] [name] MRN: [id number] Date of Birth: [birth year]  Today's Date: 05/05/2020 OT Individual Time: 1410-1505 OT Individual Time Calculation (min): 55 min    Short Term Goals: Week 1:  OT Short Term Goal 1 (Week 1): STG = LTGs due to ELOS  Skilled Therapeutic Interventions/Progress Updates:  Treatment session with focus on functional transfers and LUE NMR.  Pt received upright in recliner dozing off.  Pt reports fatigue from recently completing PT evaluation session, but agreeable to therapy session.  Pt completed ambulatory transfers with RW with CGA.  Engaged in [place] in supine, sidelying, and sitting with focus on activation of biceps in various planes.  Pt able to complete rolling on therapy mat with min cues for technique.  Pt demonstrating difficulty with forearm abduction in supine, overcompensating with shoulder elevation.  In sidelying engaged in shoulder flexion/extension with reaching pattern as well as use of ball in sidelying with flexion/extension and protraction/retraction.  Pt with improved activation in sidelying with facilitation from therapist.  Engaged in reaching task in sitting with focus on bicep activation to reach to grasp cup and then bring to mouth.  Pt required facilitation at elbow and tactile cues to decrease compensatory movements/overuse of shoulder.  Educated on self-ROM exercises and encouraged pt to complete during down time to continue focus on bicep activation.  Pt completed ambulatory transfer with RW to bed with CGA and left seated EOB with nurse tech present to take vitals.  Therapy Documentation Precautions:  Precautions Precautions: Cervical, Fall Restrictions Weight Bearing Restrictions: No General:   Vital Signs: Therapy Vitals Temp: (!) 97.2 F (36.2 C) Pulse Rate: 68 Resp: 16 BP: (!) 144/82 Patient Position (if appropriate): Sitting Oxygen Therapy SpO2: 98 % O2 Device:  Room Air Pain: Pain Assessment Pain Scale: 0-10 Pain Score: 0-No pain Pain Type: Acute pain;Surgical pain Pain Location: Back Pain Orientation: Upper;Mid Pain Descriptors / Indicators: Aching Pain Frequency: Intermittent Pain Onset: On-going Patients Stated Pain Goal: 3 Pain Intervention(s): Medication (See eMAR) (tylenol given)   Therapy/Group: Individual Therapy  [name] 05/05/2020, 3:52 PM

## 2020-05-05 NOTE — Progress Notes (Signed)
Inpatient Rehabilitation  Patient information reviewed and entered into eRehab system by Pryce Folts M. Kashay Cavenaugh, M.A., CCC/SLP, PPS Coordinator.  Information including medical coding, functional ability and quality indicators will be reviewed and updated through discharge.    

## 2020-05-05 NOTE — Progress Notes (Signed)
Patient Details  Name: [name] [name] MRN: [id number] Date of Birth: 01/19/1929  Today's Date: 05/05/2020  Hospital Problems: Principal Problem:   Stenosis of cervical spine with myelopathy [facility])  Past Medical History:  Past Medical History:  Diagnosis Date  . Acute renal failure ([place]) 12/06/2013  . Back pain    occasionally  . Benign essential tremor 2005   takes Primidone daily  . Choledocholithiasis 12/06/2013  . Constipation    takes OTC stool softener  . Coronary artery disease 2007   s/p CABG, DR Johnsie Cancel  . GERD (gastroesophageal reflux disease)    takes Nexium daily  . Gout 1995   "~ once/yr" (03/17/2014)  . History of colon polyps   . Hyperlipidemia 12/2005   takes Atorvastatin daily  . Hypertension 1995   takes Lisinopril and Coreg daily  . possible HCAP (healthcare-associated pneumonia) 12/09/2013  . Prostate cancer ([place]) 2007   S/P treatment, followed by Urology prev  . Right bundle branch block (RBBB)   . Stenosis of cervical spine   . Type II diabetes mellitus (H[facility])    no meds;diet and exercise controlled   . Wears dentures   . Wears glasses    Past Surgical History:  Past Surgical History:  Procedure Laterality Date  . Adenosine myoview  01/24/2006   Ischemia by EKG, Cath  . CATARACT EXTRACTION W/ INTRAOCULAR LENS IMPLANT Left 10/2013  . CHOLECYSTECTOMY N/A 12/06/2013   Procedure: Diagnostic Laparoscopy for  drainage Intrabdominal abcess;  Surgeon: Rolm Bookbinder, MD;  Location: [place];  Service: General;  Laterality: N/A;  . CHOLECYSTECTOMY N/A 03/17/2014   Procedure: LAPAROSCOPIC CHOLECYSTECTOMY ;  Surgeon: Rolm Bookbinder, MD;  Location: [place];  Service: General;  Laterality: N/A;  . COLONOSCOPY    . CORONARY ARTERY BYPASS GRAFT  2007   CABGX 4  . CYSTOSCOPY  02/15/2004   Mod BPH, moder tribec ?  Marland Kitchen ERCP N/A 12/06/2013   Procedure: ENDOSCOPIC RETROGRADE CHOLANGIOPANCREATOGRAPHY (ERCP);  Surgeon: Milus Banister, MD;  Location: [place];  Service:  Endoscopy;  Laterality: N/A;  . LAPAROSCOPIC CHOLECYSTECTOMY  03/17/2014  . LUMBAR FUSION  02/2012   lumbar spine for spinal stenosis  . POSTERIOR CERVICAL FUSION/FORAMINOTOMY N/A 05/01/2020   Procedure: Posterior cervical fusion with lateral mass fixation - Cervical three - Cervical six, laminectomy Cervical three-six;  Surgeon: Eustace Moore, MD;  Location: [place];  Service: Neurosurgery;  Laterality: N/A;  . PROSTATE CRYOABLATION  09/18/2005   prostate CA   Social History:  reports that he has never smoked. He has never used smokeless tobacco. He reports that he does not drink alcohol and does not use drugs.  Family / Support Systems Marital Status: Widow/Widower How Long?: 2020 Patient Roles: Parent Children: [name]-daughter [phone] Other Supports: Friends Anticipated Caregiver: [name] Ability/Limitations of Caregiver: Is a Education officer, museum and gone during the ay Caregiver Availability: Evenings only Family Dynamics: Pt is recently widowed 2020 and has lived with daughter since tis time. He has been declining falling and losing his balance in the past few months. He wants to do for himself  Social History Preferred language: English Religion: Baptist Cultural Background: No issues Education: HS Read: Yes Write: Yes Employment Status: Retired Public relations account executive Issues: No issues Guardian/Conservator: None-according to the MD pt is capable of making his own decisions while here. He wants his daughter involved per his prefence   Abuse/Neglect Abuse/Neglect Assessment Can Be Completed: Yes Physical Abuse: Denies Verbal Abuse: Denies Sexual Abuse: Denies Exploitation of patient/patient's  resources: Denies Self-Neglect: Denies  Emotional Status Pt's affect, behavior and adjustment status: Pt is motivated to do well and get his balance and arm strength better. He knows he is not safe at this time home alone with his daughter works. He will do his best and hopes to  get better here Recent Psychosocial Issues: other health issues Psychiatric History: No history recent loss of his wife, may benefit from seeing neuro-psych while here Substance Abuse History: No issues  Patient / Family Perceptions, Expectations & Goals Pt/Family understanding of illness & functional limitations: Pt and daughter can explain his surgery and are hopeful he will do better and be safer at home at discharge from rehab. They have both spoken with the MD and feel they have a clear understanding of the treatment plan going forward. Premorbid pt/family roles/activities: Father, retiree, church member, etc Anticipated changes in roles/activities/participation: resume Pt/family expectations/goals: Pt states: " I need ot get my arm and legs stronger while here."  Daughter states: " I hope he can be steadier and less app to fall."  [employer]: Other (Comment) (had in past) Premorbid Home Care/DME Agencies: Other (Comment) (has rw and quad cane-[employer] set up for [facility] follow up) Transportation available at discharge: Daughter Resource referrals recommended: Neuropsychology  Discharge Planning Living Arrangements: Children Support Systems: Children, Other relatives, Friends/neighbors Type of Residence: Private residence Insurance Resources: [employer], Multimedia programmer (specify) Sports administrator) Financial Resources: Fish farm manager, Family Support Financial Screen Referred: No Living Expenses: Lives with family Money Management: Patient, Family Does the patient have any problems obtaining your medications?: No Home Management: Daughter does this Patient/Family Preliminary Plans: Return home with daughter who works during the day, so pt needs to be mod/i to be safe at home while she is working. Care Coordinator Barriers to Discharge: Decreased caregiver support Care Coordinator Barriers to Discharge Comments: Daughter works during the day Care Coordinator Anticipated  Follow Up Needs: HH/OP  Clinical Impression Pleasant gentleman who is motivated to get his strength back and be safer at home while alone. His daughter is involved but does work. Recent loss of his wife, will ask neuro-psych to see while here. Await therapy eval.  Elease Hashimoto 05/05/2020, 9:19 AM

## 2020-05-06 ENCOUNTER — Inpatient Hospital Stay (HOSPITAL_COMMUNITY): Payer: Medicare Other | Admitting: Occupational Therapy

## 2020-05-06 ENCOUNTER — Inpatient Hospital Stay (HOSPITAL_COMMUNITY): Payer: Medicare Other | Admitting: Physical Therapy

## 2020-05-06 LAB — GLUCOSE, CAPILLARY
Glucose-Capillary: 145 mg/dL — ABNORMAL HIGH (ref 70–99)
Glucose-Capillary: 153 mg/dL — ABNORMAL HIGH (ref 70–99)
Glucose-Capillary: 174 mg/dL — ABNORMAL HIGH (ref 70–99)
Glucose-Capillary: 166 mg/dL — ABNORMAL HIGH (ref 70–99)

## 2020-05-06 MED ORDER — TAMSULOSIN HCL 0.4 MG PO CAPS
0.8000 mg | ORAL_CAPSULE | Freq: Every day | ORAL | Status: DC
  Administered 2020-05-07 – 2020-05-17 (×11): 0.8 mg via ORAL
  Filled 2020-05-06 (×11): qty 2

## 2020-05-06 MED ORDER — DEXAMETHASONE 4 MG PO TABS
4.0000 mg | ORAL_TABLET | Freq: Three times a day (TID) | ORAL | Status: DC
  Administered 2020-05-06 – 2020-05-07 (×2): 4 mg via ORAL
  Filled 2020-05-06 (×2): qty 1

## 2020-05-06 NOTE — Progress Notes (Signed)
Occupational Therapy Session Note  Patient Details  Name: [name] [name] MRN: [id number] Date of Birth: Aug 21, 1929  Today's Date: 05/06/2020 OT Individual Time: 1000-1100 and 2119-4174 OT Individual Time Calculation (min): 60 min and 53 min   Short Term Goals: Week 1:  OT Short Term Goal 1 (Week 1): STG = LTGs due to ELOS  Skilled Therapeutic Interventions/Progress Updates:    Visit 1: no c/o pain Pt seen this session for ADL training with a focus on adaptive strategies. He used the RW to ambulate from recliner to sink with CGA.   Using both standing and sitting at sink in w/c, pt engaged in b/d.  He was able to wash all areas but needed hand over hand A to use L hand to wash R arm due to limited elbow flexion AROM. He was able to reach forward to wash feet and then had min A to don pants over feet and CGA when standing to pull pants over hips.   Pt ambulated back to recliner and reassessed LUE. Pt has improved grasp strength but shoulder range limited due to neck pain. Very limited active bicep flexion.  Set pt up with estim using Empi device.  Pt able to tolerate 25 intensity.  After pt tolerated for 5 min, he was agreeable to leaving it on for 30 min total unattended. Pt instructed in how to turn off if he felt it was running too long.   Returned 30 min later to remove. Pt tolerated it well and no adverse skin reactions.   Used large muscle atrophy setting (35 pps, 5 sec on and 5 off).    Pt resting in recliner with seat alarm on and all needs met.   Visit 2: Pain: no c/o pain   Pt received in recliner ready for therapy.  Ambulated with RW from room to therapy gym (over 150 ft) with CGA.   Pt sat in arm chair at high low table and placed L arm on table. Worked on slow controlled a/arom of sh flex/ext sliding arm back and forth on table with cues to not elevate shoulder. A/arom elbow flexion with about 10% of active range.  Then used skateboard for less resistance and pt able to  flex elbow with more range but only 20%. Applied estim again and with estim during on phase worked on active elbow flexion to 100 degrees of flexion.  Repeated this for 10 min and then for 5 min, used B hands on dowel bar for elbow curls with on phase of estim.  Pt tolerated well.    He then ambulated back to his room with occasional min A due to fatigue.  Used bathroom and completed 3/3 steps with S. Pt washed his hands at the sink. Nursing students arrived to take his vitals and A him back to bed.      Therapy Documentation Precautions:  Precautions Precautions: Cervical, Fall Restrictions Weight Bearing Restrictions: No    Vital Signs: Therapy Vitals Temp: 98.3 F (36.8 C) Pulse Rate: 62 Resp: 16 BP: (!) 156/75 Patient Position (if appropriate): Lying Oxygen Therapy SpO2: 99 % O2 Device: Room Air     Therapy/Group: Individual Therapy  [place] 05/06/2020, 8:20 AM

## 2020-05-06 NOTE — Progress Notes (Signed)
Physical Therapy Session Note  Patient Details  Name: [name] [name] MRN: [id number] Date of Birth: 15-Apr-1929  Today's Date: 05/06/2020 PT Individual Time: 0810-0906 and 6967-8938 PT Individual Time Calculation (min): 56 min and 32 min  Short Term Goals: Week 1:  PT Short Term Goal 1 (Week 1): STG= LTG due to ELOS  Skilled Therapeutic Interventions/Progress Updates:    Session 1: Pt received sitting in w/c and agreeable to therapy session. Sit<>stands using RW with CGA for steadying - pt has to use R UE to assist placing L hand on/off RW due to C5 nerve palsy. Gait training ~170ft to main therapy gym using RW with CGA for steadying - continues to demo slow gait speed, decreased B LE foot clearance with decreased step length and midfoot contact as opposed to heel strike on initial contact. Seated B LE hamstring and gastroc stretch with foot on 6" step and using gait belt 1x1.16min each LE. Gait training ~65ft using RW with CGA - cuing for improved heel strike on initial contact with pt demoing improved step length and foot clearance. Repeated sit<>stands to/from EOM 2x10reps without UE support - CGA for steadying and tactile cuing for anterior weight shift following by hip extension with cuing not to push backs of legs against mat table. Pt reports that he has muscle tightness around L shoulder causing increased neck pain and during slow active cervical rotation pt demos limited L rotation compared to R with subjective report of increased "tightness" turning L - performed 10 minutes of manual therapy soft tissue mobilization and ischemic release to L upper trap, supraspinatus, and rhomboids - pt reporting some improvement in symptoms. Gait training ~86ft using RW with continued cuing and focus on improved B LE foot clearance and heel strike on initial contact. Dynamic standing balance task of alternate B LE foot taps on 6" step without UE support with min assist for balance - demos L LE impaired  coordination primarily when bringing foot off step causing narrow BOS and L lateral lean, cuing to improve. Gait training ~148ft back to room using RW with continued cuing as noted above for improved gait mechanics. Pt left seated in recliner with needs in reach, chair alarm on, and B LEs elevated.  Session 2: Pt received supine in bed and agreeable to therapy session. Supine>sitting R EOB, HOB flat but bedrails available, with CGA for steadying - demos carryover of logroll technique training. Sit<>stands with and without RW with CGA for steadying during session. Gait training ~186ft to main therapy gym using RW with CGA for steadying - continued cuing for improved heel strike on initial contact.  Performed the following dynamic balance tasks: - standing without UE support alternate foot taps on 6" step; min assist for balance and demos improved L foot placement when bringing it back off step compared to AM session - R/L lateral foot taps on step x10 reps each with light min assist for balance - R/L lateral stepping over hockey stick while in // bars with R UE support progressed to no UE support - pt has significant difficulty with LE coordination to place feet in correct spot without causing narrow BOS resulting in R LOB  - forward/backwards stepping over hockey stick in // bars without UE support - most difficulty noted when stepping forward with R and backwards with L causing R posterior LOB Gait training ~155ft back to room using RW with CGA and intermittent min assist for balance with pt noted to have most instability when  turning - cuing to keep AD on ground when turning. Pt requesting to return to bed. Sit>supine, HOB flat but bedrail available, with CGA for trunk steadying and demos reverse logroll technique. Pt left supine in bed with needs in reach and bed alarm on.  Therapy Documentation Precautions:  Precautions Precautions: Cervical, Fall Restrictions Weight Bearing Restrictions:  No  Pain: Session 1: Reports "nagging" pain in his neck - reports recent medication administration - therapist performed manual therapy for pain management.  Session 2: Continues to report he has a "nagging" pain in his neck - pt using ice upon therapist arrival - no other interventions needed for pain management during session.   Therapy/Group: Individual Therapy  Tawana Scale , PT, DPT, CSRS  05/06/2020, 7:52 AM

## 2020-05-06 NOTE — Progress Notes (Signed)
[place] PHYSICAL MEDICINE & REHABILITATION PROGRESS NOTE  Subjective/Complaints: No complaints.  Systolic BP high E-stim to left bicep today Doing well with therapy  ROS: Denies CP, SOB, N/V/D  Objective: Vital Signs: Blood pressure (!) 156/75, pulse 62, temperature 98.3 F (36.8 C), resp. rate 16, height 5\' 11"  (1.803 m), weight 86.3 kg, SpO2 99 %. DG Chest 2 View  Result Date: 05/05/2020 CLINICAL DATA:  Wheezing. EXAM: CHEST - 2 VIEW COMPARISON:  Chest x-ray 05/01/2020. FINDINGS: Mediastinum and hilar structures normal. Lungs are clear. Prior CABG. Heart size stable. Prior cervical spine fusion. Degenerative change thoracic spine. Surgical clips right upper quadrant. IMPRESSION: 1.  Prior CABG.  Heart size stable. 2.  No acute pulmonary disease. Electronically Signed   By: Marcello Moores  Register   On: 05/05/2020 11:59   Recent Labs    05/05/20 0557  WBC 13.3*  HGB 11.9*  HCT 34.5*  PLT 182   Recent Labs    05/05/20 0557  NA 132*  K 4.7  CL 101  CO2 20*  GLUCOSE 184*  BUN 84*  CREATININE 2.06*  CALCIUM 9.1    Physical Exam: BP (!) 156/75 (BP Location: Right Arm)   Pulse 62   Temp 98.3 F (36.8 C)   Resp 16   Ht 5\' 11"  (1.803 m)   Wt 86.3 kg   SpO2 99%   BMI 26.54 kg/m  General: Alert and oriented x 3, No apparent distress HEENT: Head is normocephalic, atraumatic, PERRLA, EOMI, sclera anicteric, oral mucosa pink and moist, dentition intact, ext ear canals clear,  Neck: Supple without JVD or lymphadenopathy Heart: Reg rate and rhythm. No murmurs rubs or gallops Respiratory: Normal effort.  No stridor.  + Wheezing. GI: Distended.  BS +.  Skin: Warm and dry.  Incision with dressing CDI Psych: Normal mood.  Normal behavior. Musc: No edema in extremities.  No tenderness in extremities. Neuro: Alert Motor: RUE/RLE: 5/5 proximal distal RLE: 4+/5 proximal distally LUE: Shoulder abduction 2/5, elbow flexion/extension 3/5, handgrip 4+/5  Assessment/Plan: 1.  Functional deficits secondary to C5 nerve root palsy status post C3-C6 posterior fusion which require 3+ hours per day of interdisciplinary therapy in a comprehensive inpatient rehab setting.  Physiatrist is providing close team supervision and 24 hour management of active medical problems listed below.  Physiatrist and rehab team continue to assess barriers to discharge/monitor patient progress toward functional and medical goals  Care Tool:  Bathing    Body parts bathed by patient: Left arm, Chest, Abdomen, Front perineal area, Right upper leg, Left upper leg, Face, Buttocks, Right lower leg, Left lower leg   Body parts bathed by helper: Right arm     Bathing assist Assist Level: Minimal Assistance - Patient > 75%     Upper Body Dressing/Undressing Upper body dressing   What is the patient wearing?: Pull over shirt    Upper body assist Assist Level: Moderate Assistance - Patient 50 - 74%    Lower Body Dressing/Undressing Lower body dressing      What is the patient wearing?: Pants     Lower body assist Assist for lower body dressing: Minimal Assistance - Patient > 75%     Toileting Toileting    Toileting assist Assist for toileting: Minimal Assistance - Patient > 75%     Transfers Chair/bed transfer  Transfers assist     Chair/bed transfer assist level: Minimal Assistance - Patient > 75%     Locomotion Ambulation   Ambulation assist  Assist level: Contact Guard/Touching assist Assistive device: Walker-rolling Max distance: 250   Walk 10 feet activity   Assist     Assist level: Contact Guard/Touching assist Assistive device: Walker-rolling   Walk 50 feet activity   Assist    Assist level: Contact Guard/Touching assist Assistive device: Walker-rolling    Walk 150 feet activity   Assist    Assist level: Contact Guard/Touching assist Assistive device: Walker-rolling    Walk 10 feet on uneven surface  activity   Assist      Assist level: Contact Guard/Touching assist Assistive device: Walker-rolling   Wheelchair     Assist Will patient use wheelchair at discharge?: No             Wheelchair 50 feet with 2 turns activity    Assist            Wheelchair 150 feet activity     Assist            Medical Problem List and Plan: 1.  Impaired function/C5 nerve root dyspraxia/palsy s/p C3-C6 posterior fusion with LUE weakness/N/T and impaired balance, gait and LUE movement  Continue CIR  Wean dexamethasone to 4mg  q8H  Neurosurg notes reviewed - okay to restart ASA 2.  Antithrombotics: -DVT/anticoagulation:  Mechanical: Sequential compression devices, below knee Bilateral lower extremities             -antiplatelet therapy: Aspirin restarted 3. Pain Management:  Changed Oxy to Norco and robaxin as needed. Requiring Norco.   Monitor with increased exertion 4. Mood: LCSW to follow for evaluation and support.              -antipsychotic agents: N/A 5. Neuropsych: This patient is capable of making decisions on his own behalf. 6. Skin/Wound Care: Routine pressure relief measures.  7. Fluids/Electrolytes/Nutrition: Monitor I/Os.  8. HTN: Monitor BP --continue coreg and Lisinopril daily.  Moderate increased mobility  8/28: Elevated. Increase Flomax to 0.8mg .  9. BPH: On Flomax daily.   Monitor for retention 10.  AKI on CKD:   Cr. 2.06 on 8/27, severely elevated BUN   Labs ordered for Monday, echo reviewed from 2007- normal EF  NaCL fluid bolus ordered  Encourage fluids  Wean off Lisinopril if tolerated 11. Steroid induced hyperglycemia on T2DM with hyperglycemia: Diet controlled. Monitor with ac/hs and use SSI for elevated BS especially as on steroids.   Monitor increase mobility 12. Drug induced constipation: Continue senna S.  Added miralax also and order enema tonight.   Bowel meds increased again on 8/27 13. Hyponatremia  Na 132 on 8/27, labs ordered for Monday 14.   Hypoalbuminemia  Supplement initiated on 8/27 15.  Transaminitis  AST elevated on 8/27, labs ordered for Monday 13.  Leukocytosis-likely secondary to steroids  WBC 13.3 on 8/27  Afebrile  Continue to monitor 14. ?Acute on Anemia of chronic disease   Hb 11.9 on 8/27  Labs ordered for Monday 15.  Wheezing-patient states baseline, states he did not take any medications at home for this  Chest x-ray ordered  As needed albuterol   LOS: 2 days A FACE TO FACE EVALUATION WAS PERFORMED  Clide Deutscher Mikeya Tomasetti 05/06/2020, 2:56 PM

## 2020-05-07 ENCOUNTER — Inpatient Hospital Stay (HOSPITAL_COMMUNITY): Payer: Medicare Other

## 2020-05-07 LAB — GLUCOSE, CAPILLARY
Glucose-Capillary: 167 mg/dL — ABNORMAL HIGH (ref 70–99)
Glucose-Capillary: 154 mg/dL — ABNORMAL HIGH (ref 70–99)
Glucose-Capillary: 181 mg/dL — ABNORMAL HIGH (ref 70–99)
Glucose-Capillary: 175 mg/dL — ABNORMAL HIGH (ref 70–99)

## 2020-05-07 MED ORDER — DEXAMETHASONE 2 MG PO TABS
2.0000 mg | ORAL_TABLET | Freq: Four times a day (QID) | ORAL | Status: DC
  Administered 2020-05-07 – 2020-05-10 (×11): 2 mg via ORAL
  Filled 2020-05-07 (×11): qty 1

## 2020-05-07 NOTE — Progress Notes (Signed)
Physical Therapy Session Note  Patient Details  Name: [name] [name] MRN: [id number] Date of Birth: [birth year]  Today's Date: 05/07/2020 PT Individual Time: 1135-1210 PT Individual Time Calculation (min): 35 min   Short Term Goals: Week 1:  PT Short Term Goal 1 (Week 1): STG= LTG due to ELOS  Skilled Therapeutic Interventions/Progress Updates:  Pt resting in bed.  He stated pain in neck and low back was " a little bit"' premdicated.  PT threaded boxers onto pt's LEs  Rolling R in flat bed with supervision.  Cues for bil hip and knee flexion before placing feet off of bed.   Min assist to push up to sitting.  neuromuscular re-education via demo and multimodal cues for sitting- self stretching R hamstrings and heel cord, foot elevated on foot stool x 30 seconds x 3.  In standing, bil heel/toe raises with bil UE support at sink.     Sit> stand with close supervision.  Gait training on level tile x 250' with CGA. 1 cue for increased velocity, safely without problems clearing feet.    At end of session, pt seated in w/c staring lunch with seat pad alarm set and needs at hand.  ,     Therapy Documentation Precautions:  Precautions Precautions: Cervical, Fall Restrictions Weight Bearing Restrictions: No        Therapy/Group: Individual Therapy  Jalyah Weinheimer 05/07/2020, 12:26 PM

## 2020-05-07 NOTE — Progress Notes (Addendum)
[place] PHYSICAL MEDICINE & REHABILITATION PROGRESS NOTE  Subjective/Complaints: Complains of neck and head pain- Norco helped.  Sleeping well at night Denies constipation. Ambulated well with PT  ROS: Denies CP, SOB, N/V/D  Objective: Vital Signs: Blood pressure 134/74, pulse 72, temperature 97.8 F (36.6 C), temperature source Oral, resp. rate 18, height 5\' 11"  (1.803 m), weight 86.3 kg, SpO2 99 %. No results found. Recent Labs    05/05/20 0557  WBC 13.3*  HGB 11.9*  HCT 34.5*  PLT 182   Recent Labs    05/05/20 0557  NA 132*  K 4.7  CL 101  CO2 20*  GLUCOSE 184*  BUN 84*  CREATININE 2.06*  CALCIUM 9.1    Physical Exam: BP 134/74 (BP Location: Left Arm)   Pulse 72   Temp 97.8 F (36.6 C) (Oral)   Resp 18   Ht 5\' 11"  (1.803 m)   Wt 86.3 kg   SpO2 99%   BMI 26.54 kg/m   General: Alert and oriented x 3, No apparent distress HEENT: Head is normocephalic, atraumatic, PERRLA, EOMI, sclera anicteric, oral mucosa pink and moist, dentition intact, ext ear canals clear,  Neck: Supple without JVD or lymphadenopathy Heart: Reg rate and rhythm. No murmurs rubs or gallops Respiratory: Normal effort.  No stridor.  + Wheezing. GI: Distended.  BS +.  Skin: Warm and dry.  Incision with dressing CDI Psych: Normal mood.  Normal behavior. Musc: No edema in extremities.  No tenderness in extremities. Neuro: Alert Motor: RUE/RLE: 5/5 proximal distal RLE: 4+/5 proximal distally LUE: Shoulder abduction 2/5, elbow flexion/extension 3/5, handgrip 4+/5 Ambulating with RW with PT  Assessment/Plan: 1. Functional deficits secondary to C5 nerve root palsy status post C3-C6 posterior fusion which require 3+ hours per day of interdisciplinary therapy in a comprehensive inpatient rehab setting.  Physiatrist is providing close team supervision and 24 hour management of active medical problems listed below.  Physiatrist and rehab team continue to assess barriers to  discharge/monitor patient progress toward functional and medical goals  Care Tool:  Bathing    Body parts bathed by patient: Left arm, Chest, Abdomen, Front perineal area, Right upper leg, Left upper leg, Face, Buttocks, Right lower leg, Left lower leg   Body parts bathed by helper: Right arm     Bathing assist Assist Level: Minimal Assistance - Patient > 75%     Upper Body Dressing/Undressing Upper body dressing   What is the patient wearing?: Hospital gown only    Upper body assist Assist Level: Minimal Assistance - Patient > 75%    Lower Body Dressing/Undressing Lower body dressing      What is the patient wearing?: Pants     Lower body assist Assist for lower body dressing: Minimal Assistance - Patient > 75%     Toileting Toileting    Toileting assist Assist for toileting: Supervision/Verbal cueing     Transfers Chair/bed transfer  Transfers assist     Chair/bed transfer assist level: Contact Guard/Touching assist Chair/bed transfer assistive device: Programmer, multimedia   Ambulation assist      Assist level: Contact Guard/Touching assist Assistive device: Walker-rolling Max distance: 250   Walk 10 feet activity   Assist     Assist level: Contact Guard/Touching assist Assistive device: Walker-rolling   Walk 50 feet activity   Assist    Assist level: Contact Guard/Touching assist Assistive device: Walker-rolling    Walk 150 feet activity   Assist    Assist level:  Contact Guard/Touching assist Assistive device: Walker-rolling    Walk 10 feet on uneven surface  activity   Assist     Assist level: Contact Guard/Touching assist Assistive device: Aeronautical engineer Will patient use wheelchair at discharge?: No             Wheelchair 50 feet with 2 turns activity    Assist            Wheelchair 150 feet activity     Assist            Medical Problem List and  Plan: 1.  Impaired function/C5 nerve root dyspraxia/palsy s/p C3-C6 posterior fusion with LUE weakness/N/T and impaired balance, gait and LUE movement  Continue CIR  Wean dexamethasone to 2mg  q6H  Neurosurg notes reviewed - okay to restart ASA 2.  Antithrombotics: -DVT/anticoagulation:  Mechanical: Sequential compression devices, below knee Bilateral lower extremities             -antiplatelet therapy: Aspirin restarted 3. Pain Management:  Changed Oxy to Norco and robaxin as needed. Requiring Norco. Helping with his neck and back pain. Refused kpad.   Monitor with increased exertion 4. Mood: LCSW to follow for evaluation and support.              -antipsychotic agents: N/A 5. Neuropsych: This patient is capable of making decisions on his own behalf. 6. Skin/Wound Care: Routine pressure relief measures.  7. Fluids/Electrolytes/Nutrition: Monitor I/Os.  8. HTN: Monitor BP --continue coreg and Lisinopril daily.  Moderate increased mobility  8/28: Elevated. Increase Flomax to 0.8mg .   8/29: well controlled 9. BPH: On Flomax daily.   Monitor for retention 10.  AKI on CKD:   Cr. 2.06 on 8/27, severely elevated BUN   Labs ordered for Monday, echo reviewed from 2007- normal EF  NaCL fluid bolus ordered  Encourage fluids  Wean off Lisinopril if tolerated 11. Steroid induced hyperglycemia on T2DM with hyperglycemia: Diet controlled. Monitor with ac/hs and use SSI for elevated BS especially as on steroids.   8/29: elevated. Dietary consult ordered 12. Drug induced constipation: Continue senna S.  Added miralax also and order enema tonight.   Bowel meds increased again on 8/27 13. Hyponatremia  Na 132 on 8/27, labs ordered for Monday 14.  Hypoalbuminemia  Supplement initiated on 8/27 15.  Transaminitis  AST elevated on 8/27, labs ordered for Monday 13.  Leukocytosis-likely secondary to steroids  WBC 13.3 on 8/27  Afebrile  Continue to monitor 14. ?Acute on Anemia of chronic disease    Hb 11.9 on 8/27  Labs ordered for Monday 15.  Wheezing-patient states baseline, states he did not take any medications at home for this  Chest x-ray stable  As needed albuterol   LOS: 3 days A FACE TO FACE EVALUATION WAS PERFORMED  [name] [name] 05/07/2020, 1:26 PM

## 2020-05-08 ENCOUNTER — Inpatient Hospital Stay (HOSPITAL_COMMUNITY): Payer: Medicare Other

## 2020-05-08 ENCOUNTER — Inpatient Hospital Stay (HOSPITAL_COMMUNITY): Payer: Medicare Other | Admitting: Occupational Therapy

## 2020-05-08 LAB — GLUCOSE, CAPILLARY
Glucose-Capillary: 162 mg/dL — ABNORMAL HIGH (ref 70–99)
Glucose-Capillary: 192 mg/dL — ABNORMAL HIGH (ref 70–99)
Glucose-Capillary: 168 mg/dL — ABNORMAL HIGH (ref 70–99)
Glucose-Capillary: 210 mg/dL — ABNORMAL HIGH (ref 70–99)

## 2020-05-08 LAB — COMPREHENSIVE METABOLIC PANEL
ALT: 22 U/L (ref 0–44)
AST: 40 U/L (ref 15–41)
Albumin: 2.7 g/dL — ABNORMAL LOW (ref 3.5–5.0)
Alkaline Phosphatase: 50 U/L (ref 38–126)
Anion gap: 8 (ref 5–15)
BUN: 76 mg/dL — ABNORMAL HIGH (ref 8–23)
CO2: 22 mmol/L (ref 22–32)
Calcium: 8.8 mg/dL — ABNORMAL LOW (ref 8.9–10.3)
Chloride: 101 mmol/L (ref 98–111)
Creatinine, Ser: 1.76 mg/dL — ABNORMAL HIGH (ref 0.61–1.24)
GFR calc Af Amer: 39 mL/min — ABNORMAL LOW (ref >60)
GFR calc non Af Amer: 34 mL/min — ABNORMAL LOW (ref >60)
Glucose, Bld: 172 mg/dL — ABNORMAL HIGH (ref 70–99)
Potassium: 5 mmol/L (ref 3.5–5.1)
Sodium: 131 mmol/L — ABNORMAL LOW (ref 135–145)
Total Bilirubin: 1.3 mg/dL — ABNORMAL HIGH (ref 0.3–1.2)
Total Protein: 5.5 g/dL — ABNORMAL LOW (ref 6.5–8.1)

## 2020-05-08 LAB — CBC
HCT: 33.4 % — ABNORMAL LOW (ref 39.0–52.0)
Hemoglobin: 11.2 g/dL — ABNORMAL LOW (ref 13.0–17.0)
MCH: 31.5 pg (ref 26.0–34.0)
MCHC: 33.5 g/dL (ref 30.0–36.0)
MCV: 93.8 fL (ref 80.0–100.0)
Platelets: 164 10*3/uL (ref 150–400)
RBC: 3.56 MIL/uL — ABNORMAL LOW (ref 4.22–5.81)
RDW: 15.3 % (ref 11.5–15.5)
WBC: 10.2 10*3/uL (ref 4.0–10.5)
nRBC: 0 % (ref 0.0–0.2)

## 2020-05-08 NOTE — Progress Notes (Signed)
Occupational Therapy Session Note  Patient Details  Name: [name] [name] MRN: [id number] Date of Birth: [birth year]  Today's Date: 05/08/2020 OT Individual Time: 1300-1400 OT Individual Time Calculation (min): 60 min    Short Term Goals: Week 1:  OT Short Term Goal 1 (Week 1): STG = LTGs due to ELOS  Skilled Therapeutic Interventions/Progress Updates:    Pt sitting up on BSC, no c/o pain. Pt reports he tried to have a bowel movement, but nothing happened.  Pt requesting to shave during OT session.  Pt completed SPT BSC to w/c using RW with supervision.  Pt shaved sitting sinkside with setup.  Pt washed UB and LB with min assist needing intermittent VCs to utilize compensatory techniques to follow spinal precautions.  Pt dressed UB with mod I to doff shirt and min assist to donn shirt.  Pt needing re education on compensatory technique to donn boxers and pants without bending using figure 4 position.  Pt requesting to return to bed.  Pt completed SPT w/c to EOB with supervision.  Sit to supine with CGA. Call bell in reach, bed alarm on.  Therapy Documentation Precautions:  Precautions Precautions: Cervical, Fall Restrictions Weight Bearing Restrictions: No   Therapy/Group: Individual Therapy  [name] 05/08/2020, 5:28 PM

## 2020-05-08 NOTE — Progress Notes (Signed)
[place] PHYSICAL MEDICINE & REHABILITATION PROGRESS NOTE  Subjective/Complaints: Up in chair. Pain controlled. Frustrated that left arm is still not improving.   ROS: Patient denies fever, rash, sore throat, blurred vision, nausea, vomiting, diarrhea, cough, shortness of breath or chest pain,   headache, or mood change.    Objective: Vital Signs: Blood pressure (!) 159/71, pulse (!) 58, temperature 97.7 F (36.5 C), resp. rate 16, height 5\' 11"  (1.803 m), weight 86.3 kg, SpO2 100 %. No results found. Recent Labs    05/08/20 0511  WBC 10.2  HGB 11.2*  HCT 33.4*  PLT 164   Recent Labs    05/08/20 0511  NA 131*  K 5.0  CL 101  CO2 22  GLUCOSE 172*  BUN 76*  CREATININE 1.76*  CALCIUM 8.8*    Physical Exam: BP (!) 159/71 (BP Location: Right Arm)   Pulse (!) 58   Temp 97.7 F (36.5 C)   Resp 16   Ht 5\' 11"  (1.803 m)   Wt 86.3 kg   SpO2 100%   BMI 26.54 kg/m   Constitutional: No distress . Vital signs reviewed. HEENT: EOMI, oral membranes moist Neck: supple Cardiovascular: RRR without murmur. No JVD    Respiratory/Chest: CTA Bilaterally without wheezes or rales. Normal effort    GI/Abdomen: BS +, non-tender, non-distended Ext: no clubbing, cyanosis, or edema Psych: pleasant and cooperative Skin: cervical wound CDI Musc: No edema in extremities.  No tenderness in extremities. Neuro: Alert Motor: RUE/RLE: 5/5 proximal distal RLE: 4+/5 proximal distally LUE: Shoulder abduction 2-/5, EF: 2/5, elbow extension 3+/5, handgrip 4+/5 Ambulating with RW with PT. Decreased sensation left hand and in variable pattern both legs  Assessment/Plan: 1. Functional deficits secondary to C5 nerve root palsy status post C3-C6 posterior fusion which require 3+ hours per day of interdisciplinary therapy in a comprehensive inpatient rehab setting.  Physiatrist is providing close team supervision and 24 hour management of active medical problems listed below.  Physiatrist and  rehab team continue to assess barriers to discharge/monitor patient progress toward functional and medical goals  Care Tool:  Bathing    Body parts bathed by patient: Left arm, Chest, Abdomen, Front perineal area, Right upper leg, Left upper leg, Face, Buttocks, Right lower leg, Left lower leg   Body parts bathed by helper: Right arm     Bathing assist Assist Level: Minimal Assistance - Patient > 75%     Upper Body Dressing/Undressing Upper body dressing   What is the patient wearing?: Hospital gown only    Upper body assist Assist Level: Minimal Assistance - Patient > 75%    Lower Body Dressing/Undressing Lower body dressing      What is the patient wearing?: Incontinence brief     Lower body assist Assist for lower body dressing: Minimal Assistance - Patient > 75%     Toileting Toileting    Toileting assist Assist for toileting: Contact Guard/Touching assist     Transfers Chair/bed transfer  Transfers assist     Chair/bed transfer assist level: Contact Guard/Touching assist Chair/bed transfer assistive device: Programmer, multimedia   Ambulation assist      Assist level: Contact Guard/Touching assist Assistive device: Walker-rolling Max distance: 250   Walk 10 feet activity   Assist     Assist level: Contact Guard/Touching assist Assistive device: Walker-rolling   Walk 50 feet activity   Assist    Assist level: Contact Guard/Touching assist Assistive device: Walker-rolling    Walk 150 feet  activity   Assist    Assist level: Contact Guard/Touching assist Assistive device: Walker-rolling    Walk 10 feet on uneven surface  activity   Assist     Assist level: Contact Guard/Touching assist Assistive device: Walker-rolling   Wheelchair     Assist Will patient use wheelchair at discharge?: No             Wheelchair 50 feet with 2 turns activity    Assist            Wheelchair 150 feet activity      Assist            Medical Problem List and Plan: 1.  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  Antithrombotics: -DVT/anticoagulation:  Mechanical: Sequential compression devices, below knee Bilateral lower extremities             -antiplatelet therapy: Aspirin restarted 3. Pain Management:  Changed Oxy to Norco and robaxin as needed. Requiring Norco. Helping with his neck and back pain.    -discussed appropriate positioning of LUE  Monitor with increased exertion 4. Mood: LCSW to follow for evaluation and support.              -antipsychotic agents: N/A 5. Neuropsych: This patient is capable of making decisions on his own behalf. 6. Skin/Wound Care: Routine pressure relief measures.  7. Fluids/Electrolytes/Nutrition: Monitor I/Os.  8. HTN: Monitor BP --continue coreg and Lisinopril daily.  Moderate increased mobility  8/28: Elevated. Increase Flomax to 0.8mg .   8/30: borderline controlled 9. BPH: On Flomax daily.   Monitor for retention 10.  AKI on CKD:   Cr. 2.06 on 8/27, severely elevated BUN   Labs ordered for Monday, echo reviewed from 2007- normal EF  NaCL fluid bolus ordered  Encourage fluids  8/30. Labs showing improvement today.    -daily bmet   -consider d/c of lisinopril if not continued improvement 11. Steroid induced hyperglycemia on T2DM with hyperglycemia: Diet controlled. Monitor with ac/hs and use SSI for elevated BS especially as on steroids.   8/29: elevated. Dietary consult ordered 12. Drug induced constipation: Continue senna S.  Added miralax also and order enema tonight.   Bowel meds increased again on 8/27 13. Hyponatremia  Na 132 on 8/27--> 131 8/30 14.  Hypoalbuminemia  Supplement initiated on 8/27 15.  Transaminitis  AST elevated on 8/27-->stable to improved  8/30 13.  Leukocytosis-likely secondary to steroids  WBC 13.3 on 8/27--> 10.2 8/30  Afebrile  Continue to monitor 14. ?Acute on Anemia of chronic disease   Hb 11.9 on 8/27-->stable at 11.2 8/30    15.  Wheezing-patient states baseline, states he did not take any medications at home for this  Chest x-ray stable  As needed albuterol   LOS: 4 days A FACE TO FACE EVALUATION WAS PERFORMED  Meredith Staggers 05/08/2020, 12:46 PM

## 2020-05-08 NOTE — Progress Notes (Signed)
Physical Therapy Session Note  Patient Details  Name: [name] [name] MRN: [id number] Date of Birth: 02/19/1929  Today's Date: 05/08/2020 PT Individual Time: 0845-1000 + 1100-1200 PT Individual Time Calculation (min): 75 min + 77min  Short Term Goals: Week 1:  PT Short Term Goal 1 (Week 1): STG= LTG due to ELOS  Skilled Therapeutic Interventions/Progress Updates:     1st session:  Pt received supine in bed, agreeable to PT session. Pt reports 4/10 posterior cervical neck pain, denies request for pain medication. Instructed on gentle shoulder rolls and scapular depression to assist with postural awareness and reduced overactive upper trapezius. Performed supine>sit with CGA with HOB slightly raised. Donned sweatpants in short sitting with minA for threading and minA for donning t-shirt, totalA for B shoes for time management purposes. Pt transported outside near [facility] to practice gait training on unlevel pavers and moderate incline. Pt ambulated ~245ft outside with CGA/minA and RW, cues for increasing B heel strike, step length, and maintaining proximity to AD. No knee buckling or LOB noted but increased difficulty with ramp due to decreased B hip flexion. Pt returned inside to rehab floor, performed stand<>pivot transfer from w/c to mat table with no AD and CGA. While short sitting on mat table, performed the following there-ex: -AAROM LUE 1x15 supination/pronation with therapist supporting elbow -AAROM LUE 1x15 lateral reaches with shldr WBing  -AAROM LUE 1x15 shldr abduction with elbow at 90 deg -AROM LUE 1x15 isometric shldr abduction Pt then performed 1x3 finger ladder with LUE reaching in saggital plane and x1 finger ladder with LUE in frontal plane. Performed in standing with therapist providing CGA for stability. Pt with increased difficulty during descent > ascent on finger ladder. Pt returned to w/c, transported back to his room with totalA in w/c, remained seated in chair with chair  alarm on, needs in reach.  2nd session:  Pt received sitting in w/c, agreeable to PT session. Pt transported in w/c to therapy gym with [place] for time management. Pt completed BERG, scoring 34/56. With BERG, Pt with increased difficulty in standing toe taps, tandem/semi-tandem stance, and single leg stance. Pt then performed 4-square step test with hockey sticks, required mod/maxA for frequent LOB's in both side stepping and backward stepping, with R lateral lean preference. After seated rest, pt then negotiated up/down x8 steps with minA and U HR support via step-to pattern. Pt without HR's for his 3 STE, so trial of navigated up x4 steps without HR support requiring modA. Educated patient on possibility of having B HR's installed at home, he reports his SIL may be able to assist as he works for [employer]. Pt then transported back to his room in his w/c with toalA for time management, stand<>pivot with CGA and no AD from w/c to EOB, sit>supine with CGA via log rolling technique for spinal precautions. Ended session comfortably semi-reclined, needs in reach, bed alarm on.  Patient demonstrates increased fall risk as noted by score of 34/56 on Berg Balance Scale.  (<36= high risk for falls, close to 100%; 37-45 significant >80%; 46-51 moderate >50%; 52-55 lower >25%)  Therapy Documentation Precautions:  Precautions Precautions: Cervical, Fall Restrictions Weight Bearing Restrictions: No Vital Signs: Oxygen Therapy SpO2: 100 % O2 Device: Room Air Pain: Pain Assessment Pain Scale: 0-10 Pain Score: 5  Pain Type: Acute pain Pain Location: Back Pain Descriptors / Indicators: Aching Pain Frequency: Intermittent Pain Onset: On-going Pain Intervention(s): Medication (See eMAR)  Therapy/Group: Individual Therapy  Kassidi Elza P Dava Rensch PT  05/08/2020, 10:48 AM

## 2020-05-08 NOTE — Plan of Care (Signed)
Nutrition Education Note  RD consulted for nutrition education regarding diabetes.  Spoke with pt at beside. Pt reports making many changes in his diet to help manage his diabetes. He reports that at home he does not take any medication and his diabetes is diet-controlled.  Lab Results  Component Value Date   HGBA1C 6.3 (A) 01/18/2020    RD provided "Carbohydrate Counting for People with Diabetes" handout from the Academy of [employer]etics. Discussed different food groups and their effects on blood sugar, emphasizing carbohydrate-containing foods. Provided list of carbohydrates and recommended serving sizes of common foods.  Discussed importance of controlled and consistent carbohydrate intake throughout the day. Provided examples of ways to balance meals/snacks and encouraged intake of high-fiber, whole grain complex carbohydrates. Teach back method used.  Expect good compliance.  Body mass index is 26.54 kg/m. Pt meets criteria for overweight based on current BMI.  Current diet order is Carb Modified, patient is consuming approximately 80% of meals at this time. Labs and medications reviewed. No further nutrition interventions warranted at this time. RD contact information provided. If additional nutrition issues arise, please re-consult RD.   Gaynell Face, MS, RD, LDN Inpatient Clinical Dietitian Please see [facility] for contact information.

## 2020-05-09 ENCOUNTER — Inpatient Hospital Stay (HOSPITAL_COMMUNITY): Payer: Medicare Other

## 2020-05-09 ENCOUNTER — Inpatient Hospital Stay (HOSPITAL_COMMUNITY): Payer: Medicare Other | Admitting: Occupational Therapy

## 2020-05-09 LAB — BASIC METABOLIC PANEL
Anion gap: 9 (ref 5–15)
BUN: 67 mg/dL — ABNORMAL HIGH (ref 8–23)
CO2: 23 mmol/L (ref 22–32)
Calcium: 9.5 mg/dL (ref 8.9–10.3)
Chloride: 101 mmol/L (ref 98–111)
Creatinine, Ser: 1.75 mg/dL — ABNORMAL HIGH (ref 0.61–1.24)
GFR calc Af Amer: 39 mL/min — ABNORMAL LOW (ref >60)
GFR calc non Af Amer: 34 mL/min — ABNORMAL LOW (ref >60)
Glucose, Bld: 156 mg/dL — ABNORMAL HIGH (ref 70–99)
Potassium: 4.8 mmol/L (ref 3.5–5.1)
Sodium: 133 mmol/L — ABNORMAL LOW (ref 135–145)

## 2020-05-09 LAB — GLUCOSE, CAPILLARY
Glucose-Capillary: 154 mg/dL — ABNORMAL HIGH (ref 70–99)
Glucose-Capillary: 199 mg/dL — ABNORMAL HIGH (ref 70–99)
Glucose-Capillary: 134 mg/dL — ABNORMAL HIGH (ref 70–99)
Glucose-Capillary: 157 mg/dL — ABNORMAL HIGH (ref 70–99)

## 2020-05-09 MED ORDER — MELATONIN 3 MG PO TABS
3.0000 mg | ORAL_TABLET | Freq: Every day | ORAL | Status: DC
  Administered 2020-05-09 – 2020-05-16 (×8): 3 mg via ORAL
  Filled 2020-05-09 (×9): qty 1

## 2020-05-09 MED ORDER — MAGNESIUM CITRATE PO SOLN
1.0000 | Freq: Once | ORAL | Status: AC
  Administered 2020-05-09: 1 via ORAL
  Filled 2020-05-09: qty 296

## 2020-05-09 NOTE — Progress Notes (Signed)
[facility] PHYSICAL MEDICINE & REHABILITATION PROGRESS NOTE  Subjective/Complaints: Breathing well controlled. Mag citrate today He is right handed and concerned about left arm weakness  ROS: Patient denies fever, rash, sore throat, blurred vision, nausea, vomiting, diarrhea, cough, shortness of breath or chest pain,   headache, or mood change.    Objective: Vital Signs: Blood pressure (!) 152/79, pulse (!) 58, temperature 99.3 F (37.4 C), temperature source Oral, resp. rate 16, height 5\' 11"  (1.803 m), weight 86.3 kg, SpO2 97 %. No results found. Recent Labs    05/08/20 0511  WBC 10.2  HGB 11.2*  HCT 33.4*  PLT 164   Recent Labs    05/08/20 0511 05/09/20 0611  NA 131* 133*  K 5.0 4.8  CL 101 101  CO2 22 23  GLUCOSE 172* 156*  BUN 76* 67*  CREATININE 1.76* 1.75*  CALCIUM 8.8* 9.5    Physical Exam: BP (!) 152/79 (BP Location: Right Arm)   Pulse (!) 58   Temp 99.3 F (37.4 C) (Oral)   Resp 16   Ht 5\' 11"  (1.803 m)   Wt 86.3 kg   SpO2 97%   BMI 26.54 kg/m  General: Alert and oriented x 3, No apparent distress HEENT: Head is normocephalic, atraumatic, PERRLA, EOMI, sclera anicteric, oral mucosa pink and moist, dentition intact, ext ear canals clear,  Neck: Supple without JVD or lymphadenopathy Heart: Reg rate and rhythm. No murmurs rubs or gallops Chest: CTA bilaterally without wheezes, rales, or rhonchi; no distress Abdomen: Soft, non-tender, non-distended, bowel sounds positive. Extremities: No clubbing, cyanosis, or edema. Pulses are 2+ Psych: pleasant and cooperative Skin: cervical wound CDI Musc: No edema in extremities.  No tenderness in extremities. Neuro: Alert Motor: RUE/RLE: 5/5 proximal distal RLE: 4+/5 proximal distally LUE: Shoulder abduction 2-/5, EF: 2/5, elbow extension 3+/5, handgrip 4+/5 Ambulating with RW with PT. Decreased sensation left hand and in variable pattern both legs   Assessment/Plan: 1. Functional deficits secondary to C5  nerve root palsy status post C3-C6 posterior fusion which require 3+ hours per day of interdisciplinary therapy in a comprehensive inpatient rehab setting.  Physiatrist is providing close team supervision and 24 hour management of active medical problems listed below.  Physiatrist and rehab team continue to assess barriers to discharge/monitor patient progress toward functional and medical goals  Care Tool:  Bathing    Body parts bathed by patient: Left arm, Chest, Abdomen, Front perineal area, Right upper leg, Left upper leg, Face, Buttocks, Right lower leg, Left lower leg   Body parts bathed by helper: Right arm     Bathing assist Assist Level: Minimal Assistance - Patient > 75%     Upper Body Dressing/Undressing Upper body dressing   What is the patient wearing?: Pull over shirt    Upper body assist Assist Level: Minimal Assistance - Patient > 75%    Lower Body Dressing/Undressing Lower body dressing      What is the patient wearing?: Underwear/pull up, Pants     Lower body assist Assist for lower body dressing: Minimal Assistance - Patient > 75%     Toileting Toileting    Toileting assist Assist for toileting: Contact Guard/Touching assist     Transfers Chair/bed transfer  Transfers assist     Chair/bed transfer assist level: Contact Guard/Touching assist Chair/bed transfer assistive device: Programmer, multimedia   Ambulation assist      Assist level: Contact Guard/Touching assist Assistive device: Walker-rolling Max distance: 250   Walk 10  feet activity   Assist     Assist level: Contact Guard/Touching assist Assistive device: Walker-rolling   Walk 50 feet activity   Assist    Assist level: Contact Guard/Touching assist Assistive device: Walker-rolling    Walk 150 feet activity   Assist    Assist level: Contact Guard/Touching assist Assistive device: Walker-rolling    Walk 10 feet on uneven surface   activity   Assist     Assist level: Contact Guard/Touching assist Assistive device: Walker-rolling   Wheelchair     Assist Will patient use wheelchair at discharge?: No             Wheelchair 50 feet with 2 turns activity    Assist            Wheelchair 150 feet activity     Assist            Medical Problem List and Plan: 1.  Impaired function/C5 nerve root dyspraxia/palsy s/p C3-C6 posterior fusion with LUE weakness/N/T and impaired balance, gait and LUE movement  Continue CIR  Weaned dexamethasone to 2mg  q6H on 8/20---continue to taper 2.  Antithrombotics: -DVT/anticoagulation:  Mechanical: Sequential compression devices, below knee Bilateral lower extremities             -antiplatelet therapy: Aspirin restarted 3. Pain Management:  Changed Oxy to Norco and robaxin as needed. Requiring Norco. Helping with his neck and back pain.    -discussed appropriate positioning of LUE  Monitor with increased exertion 4. Mood: LCSW to follow for evaluation and support.              -antipsychotic agents: N/A 5. Neuropsych: This patient is capable of making decisions on his own behalf. 6. Skin/Wound Care: Routine pressure relief measures. D/c IV 7. Fluids/Electrolytes/Nutrition: Monitor I/Os.  8. HTN: Monitor BP --continue coreg and Lisinopril daily.  Moderate increased mobility  8/28: Elevated. Increase Flomax to 0.8mg .   8/31: elevated: add banana with breakfast 9. BPH: On Flomax daily.   Monitor for retention 10.  AKI on CKD:   Cr. 2.06 on 8/27, severely elevated BUN   Labs ordered for Monday, echo reviewed from 2007- normal EF  NaCL fluid bolus ordered  Encourage fluids  8/30. Labs showing improvement today.    -daily bmet   -consider d/c of lisinopril if not continued improvement 11. Steroid induced hyperglycemia on T2DM with hyperglycemia: Diet controlled. Monitor with ac/hs and use SSI for elevated BS especially as on steroids.   8/29:  elevated. Dietary consult ordered 12. Drug induced constipation: Continue senna S.  Added miralax also and order enema tonight.   Bowel meds increased again on 8/27  8/31: start Miralax 13. Hyponatremia  Na 132 on 8/27--> 131 8/30 14.  Hypoalbuminemia  Supplement initiated on 8/27 15.  Transaminitis  AST elevated on 8/27-->stable to improved 8/30 13.  Leukocytosis-likely secondary to steroids  WBC 13.3 on 8/27--> 10.2 8/30  Afebrile  Continue to monitor 14. ?Acute on Anemia of chronic disease   Hb 11.9 on 8/27-->stable at 11.2 8/30    15.  Wheezing-patient states baseline, states he did not take any medications at home for this  Chest x-ray stable  As needed albuterol  16. Insomnia: start melatonin HS  LOS: 5 days A FACE TO FACE EVALUATION WAS PERFORMED  Clide Deutscher Meghin Thivierge 05/09/2020, 10:46 AM

## 2020-05-09 NOTE — Progress Notes (Signed)
Occupational Therapy Session Note  Patient Details  Name: IOAN LANDINI MRN: [id number] Date of Birth: 02-06-1929  Today's Date: 05/09/2020 OT Individual Time: 1100-1157 OT Individual Time Calculation (min): 57 min    Short Term Goals: Week 1:  OT Short Term Goal 1 (Week 1): STG = LTGs due to ELOS  Skilled Therapeutic Interventions/Progress Updates:    Pt sitting up in w/c, no c/o pain.  Pt agreeable to bathing at shower level.  Pt ambulated to bathroom using RW with CGA and completed shower bench transfer with education on safe RW mgt needed.  Max assist needed to cover IV RUE and incision back of neck with waterproof barrier. Pt bathed UB with min assist and bathed LB with CGA with good carryover of compensatory strategies from previous skilled training sessions to follow spinal precautions. Pt ambulated to w/c at bedside with RW needing CGA.  Pt completed all UB dressing with supervision needing occasional VCs for compensatory technique and completed all LB dressing with CGA.  Pt completed oral hygiene with setup.  Call bell in reach, seat alarm on.  Therapy Documentation Precautions:  Precautions Precautions: Cervical, Fall Restrictions Weight Bearing Restrictions: No   Therapy/Group: Individual Therapy  [name] 05/09/2020, 12:47 PM

## 2020-05-09 NOTE — Progress Notes (Signed)
Physical Therapy Session Note  Patient Details  Name: [name] [name] MRN: [id number] Date of Birth: 22-Sep-1928  Today's Date: 05/09/2020 PT Individual Time: 1300-1413 PT Individual Time Calculation (min): 73 min   Short Term Goals: Week 1:  PT Short Term Goal 1 (Week 1): STG= LTG due to ELOS  Skilled Therapeutic Interventions/Progress Updates:    Patient received sitting up in wc agreeable to PT. He reports 3/10 pain in B LE, R >L, but denies pain interventions and attributes this to mm soreness from activity. PT propelled patient in wc to therapy gym for time management. Able to complete seated therex with 2.5# ankle weight: LAQ, HF, toe/heel raises. Patient reporting decreased mm soreness after "working them out." Patient completing standing toe taps onto 4" box with B UE support on FWW + CGA. Increased difficulty completing full ROM on R d/t decreased HF strength. Compensatory hip hike noted. Patient completing 4 square stepping with MinA for postural stability and verbal cues for sequencing. Intermittent LOB to the R noted with delayed righting reactions requiring up to [place] from PT to assist with regaining balance. Patient completing lateral stepping in // bars with CGA. Shortened step width noted, to the L >R. Patient given visual cues to step over targets with greater success. Patient with difficulty adjusting weight shift when ambulating backwards requiring up to MinA to maintain balance. Improved ambulatory balance noted when given visual targets to step toward to encourage wider BOS and larger step length. Patient completing SLS toe taps onto cone when ambulating in // bars. Up to MinA needed for postural control d/t delayed righting reactions. Patient ambulating 300 ft, 160ft, 193ft with FWW and CGA. Verbal cues needed to encourage increased B foot clearance. Decreased heel strike noted with flat foot contact despite verbal cuing. Gait impairment appears to be originating from decreased  hip flexor strength. Patient requesting to return to bed, bed alarm on, call light within reach.   Therapy Documentation Precautions:  Precautions Precautions: Cervical, Fall Restrictions Weight Bearing Restrictions: No    Therapy/Group: Individual Therapy  Karoline Caldwell, PT, DPT, CBIS 05/09/2020, 7:46 AM

## 2020-05-09 NOTE — Progress Notes (Addendum)
Physical Therapy Session Note  Patient Details  Name: [name] [name] MRN: [id number] Date of Birth: [birth year]  Today's Date: 05/09/2020 PT Individual Time: 0908-1002 PT Individual Time Calculation (min): 54 min   Short Term Goals: Week 1:  PT Short Term Goal 1 (Week 1): STG= LTG due to ELOS  Skilled Therapeutic Interventions/Progress Updates:    Pt received semi-reclined in bed, agreeable to PT session. Pt reports mild cervical neck pain, denies request for pain medication. Performed supine<>sit via log rolling with CGA and bed rail, donned B slippers with modA while short sitting EOB via figure-4 technique, may benefit from a long handheld shoe horn for adaptive equipment. Stand<>pivot transfer with minA and no AD from EOB to w/c. Pt's fruit cup then arrived from nursing and patient requesting to quickly eat which he did while seated in his chair. Pt then requesting toileting needs, so he ambulated from bedside to toilet with CGA and RW, able to pull down pants while standing with CGA/minA due to posterior lean. Continent of voiding and ambulated back to his w/c with CGA and RW. Pt transported to therapy gym with totalA for time management where he performed Biodex, focusing on standing balance and reducing posterior lean. Pt performed "limits of stability" with x2 trials with BUE support and no BUE support. With BUE support, scored 22%, without BUE support, scored 13%. Cues provided for technique of weight shifting including lateral, posterior, and anterior. After seated rest break, pt ambulated >234ft with CGA and RW in hallways, focusing on upright posture, proximity to AD, increasing B heel strike, and deep breathing. Ambulated to ADL room where he performed x2 sit<>stand transfers from recliner with minA and RW, slight increased difficulty with producing power in BLE's for transfer. Pt then performed sit<>supine on flat bed without rails with supervision via reverse  log rolling, rolled L<>R  in flat bed with supervision, and returned supine<>sit with supervision via log roll.  Pt then returned to his room in his w/c, remained seated in chair at end of session, needs in reach.   Therapy Documentation Precautions:  Precautions Precautions: Cervical, Fall Restrictions Weight Bearing Restrictions: No   Therapy/Group: Individual Therapy   Allyna Pittsley P Porfirio Bollier PT 05/09/2020, 12:17 PM

## 2020-05-10 ENCOUNTER — Inpatient Hospital Stay (HOSPITAL_COMMUNITY): Payer: Medicare Other

## 2020-05-10 ENCOUNTER — Inpatient Hospital Stay (HOSPITAL_COMMUNITY): Payer: Medicare Other | Admitting: Occupational Therapy

## 2020-05-10 LAB — GLUCOSE, CAPILLARY
Glucose-Capillary: 174 mg/dL — ABNORMAL HIGH (ref 70–99)
Glucose-Capillary: 183 mg/dL — ABNORMAL HIGH (ref 70–99)
Glucose-Capillary: 133 mg/dL — ABNORMAL HIGH (ref 70–99)
Glucose-Capillary: 195 mg/dL — ABNORMAL HIGH (ref 70–99)

## 2020-05-10 MED ORDER — DEXAMETHASONE 2 MG PO TABS
2.0000 mg | ORAL_TABLET | Freq: Three times a day (TID) | ORAL | Status: DC
  Administered 2020-05-10 – 2020-05-11 (×3): 2 mg via ORAL
  Filled 2020-05-10 (×3): qty 1

## 2020-05-10 NOTE — Progress Notes (Signed)
Occupational Therapy Session Note  Patient Details  Name: [name] [name] MRN: [id number] Date of Birth: 1928/11/30  Today's Date: 05/10/2020 OT Individual Time: 0915-1000 and 3646-8032 OT Individual Time Calculation (min): 45 min and 30 min   Short Term Goals: Week 1:  OT Short Term Goal 1 (Week 1): STG = LTGs due to ELOS  Skilled Therapeutic Interventions/Progress Updates:    Visit 1:  Pain: 5/10 neck - premedicated   Pt seen for BADL retraining of grooming, bathing, and dressing with a focus on use of LUE and functional mobility.  See ADL documentation below. Pt is improving with his balance but is not safe enough to complete transfers independently.  He will need to be mod I at home as his daughter works during the day. He needs assist to fully cleanse under R arm so a long handled bath sponge would be helpful.  Pt is able to do a great deal for himself but struggles with donning socks and shoes.   He states his neck pain tends to interfere with his capabilities.  Pt resting in w/c and hand off to PT for his next session.    Visit 2:  Pain: 4/10 neck pain  Pt received in wc.  Pt seen this session for LUE bicep stimulation NMR with estim using 35 pps at intensity 7/10 for 5 sec on and 5 off while pt worked on aarom of elbow. Pt tolerated estim well.  He has minimal elbow flexion (5%) against gravity with more with gravity eliminated (40-50%).  Pt worked with estim for 20 minutes.  Good response and no adverse skin reactions.  Pt set up with meal tray. All needs met.    Therapy Documentation Precautions:  Precautions Precautions: Cervical, Fall Restrictions Weight Bearing Restrictions: No     Pain: Pain Assessment Pain Scale: 0-10 Pain Score: 4  ADL: ADL Eating: Set up Where Assessed-Eating: Chair Grooming: Setup Where Assessed-Grooming: Sitting at sink Upper Body Bathing: Minimal assistance Where Assessed-Upper Body Bathing: Shower Lower Body Bathing:  Supervision/safety Where Assessed-Lower Body Bathing: Shower Upper Body Dressing: Supervision/safety Where Assessed-Upper Body Dressing: Chair Lower Body Dressing: Minimal assistance Where Assessed-Lower Body Dressing: Chair Toileting: Supervision/safety Where Assessed-Toileting: Glass blower/designer: Therapist, music Method: Magazine features editor: Curator Method: Heritage manager: Shower seat with back   Therapy/Group: Individual Therapy  [place] 05/10/2020, 12:13 PM

## 2020-05-10 NOTE — Plan of Care (Signed)
°  Problem: Consults Goal: RH GENERAL PATIENT EDUCATION Description: See Patient Education module for education specifics. Outcome: Progressing Goal: Skin Care Protocol Initiated - if Braden Score 18 or less Description: If consults are not indicated, leave blank or document N/A Outcome: Progressing Goal: Diabetes Guidelines if Diabetic/Glucose > 140 Description: If diabetic or lab glucose is > 140 mg/dl - Initiate Diabetes/Hyperglycemia Guidelines & Document Interventions  Outcome: Progressing   Problem: RH BOWEL ELIMINATION Goal: RH STG MANAGE BOWEL WITH ASSISTANCE Description: STG Manage Bowel with mod I Assistance. Outcome: Progressing Goal: RH STG MANAGE BOWEL W/MEDICATION W/ASSISTANCE Description: STG Manage Bowel with Medication with mod I Assistance. Outcome: Progressing   Problem: RH BLADDER ELIMINATION Goal: RH STG MANAGE BLADDER WITH ASSISTANCE Description: STG Manage Bladder With mod I Assistance Outcome: Progressing Goal: RH STG MANAGE BLADDER WITH MEDICATION WITH ASSISTANCE Description: STG Manage Bladder With Medication With mod I Assistance. Outcome: Progressing   Problem: RH SKIN INTEGRITY Goal: RH STG SKIN FREE OF INFECTION/BREAKDOWN Description: Patient will have no signs of infection while on CIR Outcome: Progressing Goal: RH STG MAINTAIN SKIN INTEGRITY WITH ASSISTANCE Description: STG Maintain Skin Integrity With mod I Assistance. Outcome: Progressing Goal: RH STG ABLE TO PERFORM INCISION/WOUND CARE W/ASSISTANCE Description: STG Able To Perform Incision/Wound Care With mod I Assistance. Outcome: Progressing   Problem: RH SAFETY Goal: RH STG ADHERE TO SAFETY PRECAUTIONS W/ASSISTANCE/DEVICE Description: STG Adhere to Safety Precautions With mod I Assistance/Device. Outcome: Progressing Goal: RH STG DECREASED RISK OF FALL WITH ASSISTANCE Description: STG Decreased Risk of Fall With mod I Assistance. Outcome: Progressing   Problem: RH PAIN  MANAGEMENT Goal: RH STG PAIN MANAGED AT OR BELOW PT'S PAIN GOAL Description: Pain scale <4/10 Outcome: Progressing   Problem: RH KNOWLEDGE DEFICIT GENERAL Goal: RH STG INCREASE KNOWLEDGE OF SELF CARE AFTER HOSPITALIZATION Outcome: Progressing

## 2020-05-10 NOTE — Progress Notes (Signed)
Physical Therapy Session Note  Patient Details  Name: [name] [name] MRN: [id number] Date of Birth: [birth year]  Today's Date: 05/10/2020 PT Individual Time: 1000-1100 + 1500-1530 PT Individual Time Calculation (min): 60 min +30 min  Short Term Goals: Week 1:  PT Short Term Goal 1 (Week 1): STG= LTG due to ELOS  Skilled Therapeutic Interventions/Progress Updates:  1st session:    Pt received sitting in w/c, OT present for handoff of care. Pt agreeable to PT session. Provided patient with scrub pants which were donned with modA via threading while he was sitting in chair, performed sit<>stand with CGA and RW where he was able to pull his pants up with CGA while standing. Pt then ambulated from his room around the day room gym, down the hall to small therapy gym (343ft), all with CGA/supervision and RW. With gait, pt demo's slight R lateral lean and posterior tendency. After seated rest break on mat table, pt performed neuro-re ed on blue airex foam pad. Performed unsupported feet apart and feet together with trials of eyes open vs eyes closed, required CGA/minA for truncal stability 2/2 posterior lean.After seated rest, pt then completed standing marching while on blue airex foam pad to [place] with RW support and lateral stepping onto 6inch platform while standing on blue airex pad, CGA/minA for stability. Cues for sequencing, technique, upright posture, and weight shifting throughout. Pt then performed gait training on level surfaces, performed stepping over 6inch cones that were spaced ~2 ft apart, focusing on increasing B hip flexion and step length. Pt with moderate difficulty completing this, requiring min/modA for stability and trouble clearing cones. Pt transported back to his room in w/c with totalA for time management, ended session seated in chair, needs in reach.   2nd session: Pt received sitting in w/c, family (daughter and SIL) at bedside, pt agreeable to PT session. Focus of  session family ed/training. Pt reports 4/10 posterior cervical neck pain, denies request for pain intervention. Pt performed sit<>stand transfer with supervision and RW, ambulated from his room to practice stairs with supervision and RW where he navigated up/down x12 steps with minA and B HR support, step-to pattern. After seated rest, pt ambulated to car simulator where he performed car transfer with supervision, increased difficulty bringing BLE's into/out of the car but able to use his RUE to assist. Family present throughout session and educated on home safety training, eventually needing to install rails for stairs at home, and role of f/u therapy services. Pt ambulated back to his room with supervision and RW and ended session on the toilet to attempt to have a BM. Pt instructed on pull cord when complete for nursing staff to assist, pt understanding.   Therapy Documentation Precautions:  Precautions Precautions: Cervical, Fall Restrictions Weight Bearing Restrictions: No Vital Signs: Oxygen Therapy SpO2: 99 % O2 Device: Room Air Pain: Pain Assessment Pain Scale: 0-10 Pain Score: 4    Therapy/Group: Individual Therapy  Geanie Pacifico P Veleka Djordjevic PT 05/10/2020, 10:22 AM

## 2020-05-10 NOTE — Progress Notes (Addendum)
Patient ID: [name] [name], male   DOB: 1929/01/29, 86 y.o.   MRN: [id number]  Met with pt to discuss team conference goals mod/i level and target discharge 9/8. He is very pleased with his progress and is focused on working on his L-UE. His daughter and son in-law are coming in today to see him in PT session at 3:00 and then meet with myself to discuss discharge needs.  3:35 PM Daughter and son in-law here to observe in PT session, very impressed with how well pt is doing in therapies and the progress he has made while here. Pt pushes himself and glad to be here for another week to make more progress. Work on discharge needs.

## 2020-05-10 NOTE — Progress Notes (Signed)
[facility] PHYSICAL MEDICINE & REHABILITATION PROGRESS NOTE  Subjective/Complaints: BP 144/69, pulse 54- other vitals stable Na improved yesterday to 133.  Cr stable at 1.75- please encourage hydration.  Had BM yesterday with mag citrate.   ROS: Patient denies fever, rash, sore throat, blurred vision, nausea, vomiting, diarrhea, cough, shortness of breath or chest pain,   headache, or mood change.    Objective: Vital Signs: Blood pressure (!) 144/69, pulse (!) 54, temperature 97.7 F (36.5 C), temperature source Oral, resp. rate 18, height 5\' 11"  (1.803 m), weight 86.3 kg, SpO2 99 %. No results found. Recent Labs    05/08/20 0511  WBC 10.2  HGB 11.2*  HCT 33.4*  PLT 164   Recent Labs    05/08/20 0511 05/09/20 0611  NA 131* 133*  K 5.0 4.8  CL 101 101  CO2 22 23  GLUCOSE 172* 156*  BUN 76* 67*  CREATININE 1.76* 1.75*  CALCIUM 8.8* 9.5    Physical Exam: BP (!) 144/69 (BP Location: Right Arm)   Pulse (!) 54   Temp 97.7 F (36.5 C) (Oral)   Resp 18   Ht 5\' 11"  (1.803 m)   Wt 86.3 kg   SpO2 99%   BMI 26.54 kg/m  General: Alert and oriented x 3, No apparent distress HEENT: Head is normocephalic, atraumatic, PERRLA, EOMI, sclera anicteric, oral mucosa pink and moist, dentition intact, ext ear canals clear,  Neck: Supple without JVD or lymphadenopathy Heart: Reg rate and rhythm. No murmurs rubs or gallops Chest: CTA bilaterally without wheezes, rales, or rhonchi; no distress Abdomen: Soft, non-tender, non-distended, bowel sounds positive. Extremities: No clubbing, cyanosis, or edema. Pulses are 2+ Skin: Clean and intact without signs of breakdown Psych: pleasant and cooperative Skin: cervical wound CDI Musc: No edema in extremities.  No tenderness in extremities. Neuro: Alert Motor: RUE/RLE: 5/5 proximal distal RLE: 4+/5 proximal distally LUE: Shoulder abduction 2-/5, EF: 2/5, elbow extension 3+/5, handgrip 4+/5 Ambulating with RW with PT. Decreased sensation  left hand and in variable pattern both legs    Assessment/Plan: 1. Functional deficits secondary to C5 nerve root palsy status post C3-C6 posterior fusion which require 3+ hours per day of interdisciplinary therapy in a comprehensive inpatient rehab setting.  Physiatrist is providing close team supervision and 24 hour management of active medical problems listed below.  Physiatrist and rehab team continue to assess barriers to discharge/monitor patient progress toward functional and medical goals  Care Tool:  Bathing    Body parts bathed by patient: Left arm, Chest, Abdomen, Front perineal area, Right upper leg, Left upper leg, Face, Buttocks, Right lower leg, Left lower leg, Right arm   Body parts bathed by helper: Right arm     Bathing assist Assist Level: Supervision/Verbal cueing     Upper Body Dressing/Undressing Upper body dressing   What is the patient wearing?: Pull over shirt    Upper body assist Assist Level: Supervision/Verbal cueing    Lower Body Dressing/Undressing Lower body dressing      What is the patient wearing?: Pants     Lower body assist Assist for lower body dressing: Supervision/Verbal cueing     Toileting Toileting    Toileting assist Assist for toileting: Contact Guard/Touching assist     Transfers Chair/bed transfer  Transfers assist     Chair/bed transfer assist level: Supervision/Verbal cueing Chair/bed transfer assistive device: Programmer, multimedia   Ambulation assist      Assist level: Contact Guard/Touching assist Assistive device:  Walker-rolling Max distance: 300   Walk 10 feet activity   Assist     Assist level: Contact Guard/Touching assist Assistive device: Walker-rolling   Walk 50 feet activity   Assist    Assist level: Contact Guard/Touching assist Assistive device: Walker-rolling    Walk 150 feet activity   Assist    Assist level: Contact Guard/Touching assist Assistive device:  Walker-rolling    Walk 10 feet on uneven surface  activity   Assist     Assist level: Contact Guard/Touching assist Assistive device: Walker-rolling   Wheelchair     Assist Will patient use wheelchair at discharge?: No             Wheelchair 50 feet with 2 turns activity    Assist            Wheelchair 150 feet activity     Assist            Medical Problem List and Plan: 1.  Impaired function/C5 nerve root dyspraxia/palsy s/p C3-C6 posterior fusion with LUE weakness/N/T and impaired balance, gait and LUE movement  Continue CIR  Decreased dexamethasone to 2mg  q8H.   Team conference today.  2.  Antithrombotics: -DVT/anticoagulation:  Mechanical: Sequential compression devices, below knee Bilateral lower extremities             -antiplatelet therapy: Aspirin restarted 3. Pain Management:  Changed Oxy to Norco and robaxin as needed. Requiring Norco. Helping with his neck and back pain.    -discussed appropriate positioning of LUE  Monitor with increased exertion 4. Mood: LCSW to follow for evaluation and support.              -antipsychotic agents: N/A 5. Neuropsych: This patient is capable of making decisions on his own behalf. 6. Skin/Wound Care: Routine pressure relief measures. D/c IV 7. Fluids/Electrolytes/Nutrition: Monitor I/Os.  8. HTN: Monitor BP --continue coreg and Lisinopril daily.  Moderate increased mobility  8/28: Elevated. Increase Flomax to 0.8mg .   8/31: elevated: add banana with breakfast  9/1: labile- continue to monitor.  9. BPH: On Flomax daily.   Monitor for retention 10.  AKI on CKD:   Cr. 2.06 on 8/27, severely elevated BUN   Labs ordered for Monday, echo reviewed from 2007- normal EF  NaCL fluid bolus ordered  Encourage fluids  8/30. Labs showing improvement today.    -daily bmet   -consider d/c of lisinopril if not continued improvement  9/1: Cr 1.75: encouraged hydration 11. Steroid induced hyperglycemia on  T2DM with hyperglycemia: Diet controlled. Monitor with ac/hs and use SSI for elevated BS especially as on steroids.   8/29: elevated. Dietary consult ordered 12. Drug induced constipation: Continue senna S.  Added miralax also and order enema tonight.   Bowel meds increased again on 8/27  8/31: start Miralax  9/1: Had BM after mag citrate 13. Hyponatremia  Na 132 on 8/27--> 131 8/30 14.  Hypoalbuminemia  Supplement initiated on 8/27 15.  Transaminitis  AST elevated on 8/27-->stable to improved 8/30 13.  Leukocytosis-likely secondary to steroids  WBC 13.3 on 8/27--> 10.2 8/30  Afebrile  Continue to monitor 14. ?Acute on Anemia of chronic disease   Hb 11.9 on 8/27-->stable at 11.2 8/30    15.  Wheezing-patient states baseline, states he did not take any medications at home for this  Chest x-ray stable  As needed albuterol  16. Insomnia: start melatonin HS  LOS: 6 days A FACE TO FACE EVALUATION WAS PERFORMED  Martha Clan P  Talajah Slimp 05/10/2020, 10:01 AM

## 2020-05-10 NOTE — Patient Care Conference (Signed)
Inpatient RehabilitationTeam Conference and Plan of Care Update Date: 05/10/2020   Time: 11:14 AM    Patient Name: [name] [name]      Medical Record Number: [id number]  Date of Birth: 08-Mar-1929 Sex: Male         Room/Bed: 4W07C/4W07C-01 Payor Info: Payor: MEDICARE / Plan: MEDICARE PART A AND B / Product Type: *No Product type* /    Admit Date/Time:  05/04/2020  4:20 PM  Primary Diagnosis:  Stenosis of cervical spine with myelopathy [facility])  Hospital Problems: Principal Problem:   Stenosis of cervical spine with myelopathy (HCC) Active Problems:   AKI (acute kidney injury) ([facility])   Steroid-induced hyperglycemia   Controlled type 2 diabetes mellitus with hyperglycemia, without long-term current use of insulin (HCC)   Drug induced constipation   Hyponatremia   Hypoalbuminemia due to protein-calorie malnutrition (HCC)   Transaminitis   Leucocytosis   Acute on chronic anemia    Expected Discharge Date: Expected Discharge Date: 05/17/20  Team Members Present: Physician leading conference: Dr. Leeroy Cha Care Coodinator Present: Dorien Chihuahua, RN, BSN, CRRN;Other (comment) Jacqlyn Larsen Dupree, SW) Nurse Present: Serena Croissant, LPN PT Present: Other (comment) (Christain Manhard, PT) OT Present: Meriel Pica, OT PPS Coordinator present : Gunnar Fusi, Novella Olive, PT     Current Status/Progress Goal Weekly Team Focus  Bowel/Bladder   Pt is continent of bowel and bladder.  To remain continent of bowel/bladder.  Assess tolieting needs   Swallow/Nutrition/ Hydration             ADL's   CGA to supervision for ADLs and functional transfers  Independent with assistive device to supervision for ADLs and functional transfers  ADL training, compensatory strategy education, spinal precuation education, left biceps neuro re-ed   Mobility   CGA/minA bed mobility and transfers, gait 114ft with RW, stairs min/modA  mod I  balance, LUE NMR, gait, stairs. Family ed   Communication              Safety/Cognition/ Behavioral Observations            Pain   Pain is usually 5-7/10 in lower neck to back. He gets prn tramadol and norco if needed.  To bring pain level down to 2/10.  Assess pain q shift or prn.   Skin   Has incision to posterior part of neck, has adhesive strips.  To promote healing and preventing skin breakdown.  Assess skin q shift or prn.     Discharge Planning:  HOme with daughter and son in-law who both work during the day. Pt will be alone while daughter working. Daughter and son in-law plan to come in today for PT session   Team Discussion: Creatine trending up; encourage fluids per MD. Issues with left UE, balance, strenght and activity tolerance. Patient on target to meet rehab goals: yes  *See Care Plan and progress notes for long and short-term goals.   Revisions to Treatment Plan:   Teaching Needs: Transfers, toileting, energy conservation/activity tolerance, hydration, medications, etc.  Current Barriers to Discharge: Decreased caregiver support and Home enviroment access/layout  Possible Resolutions to Barriers: Family education set for 05/10/20     Medical Summary Current Status: Neck/shoulder pain, constipation  Barriers to Discharge: Medication compliance;Wound care;Decreased family/caregiver support  Barriers to Discharge Comments: neck shoulder pain, constipation, surgical incision, lives alone Possible Resolutions to [employer] Focus: continue wound care for surgical incision, mag citrate given for constipation, Norco and Tylenol for pain control, caregiver training of daughter  Continued Need for Acute Rehabilitation Level of Care: The patient requires daily medical management by a physician with specialized training in physical medicine and rehabilitation for the following reasons: Direction of a multidisciplinary physical rehabilitation program to maximize functional independence : Yes Medical management of patient  stability for increased activity during participation in an intensive rehabilitation regime.: Yes Analysis of laboratory values and/or radiology reports with any subsequent need for medication adjustment and/or medical intervention. : Yes   I attest that I was present, lead the team conference, and concur with the assessment and plan of the team.   Dorien Chihuahua B 05/10/2020, 2:50 PM

## 2020-05-10 NOTE — Progress Notes (Signed)
Occupational Therapy Session Note  Patient Details  Name: KASHTON MCARTOR MRN: [id number] Date of Birth: Aug 12, 1929  Today's Date: 05/10/2020 OT Individual Time: 1330-1400 OT Individual Time Calculation (min): 30 min    Short Term Goals: Week 1:  OT Short Term Goal 1 (Week 1): STG = LTGs due to ELOS  Skilled Therapeutic Interventions/Progress Updates:    Pt sitting up in w/c, no c/o pain, agreeable to OT session.  Pt transported to dayroom to focus on LUE strengthening.  Pt instructed through AAROM left elbow flexion and extension at tabletop using pillowcase, and also in horizontal plane with manual support, as well as using weightless dowel against gravity.  Pt completed 3 x 10 reps each exercise.  Pt needing intermittent TCs to facilitate correct muscle activation.  Pt returned to room, call bell in reach, seat alarm on.  Therapy Documentation Precautions:  Precautions Precautions: Cervical, Fall Restrictions Weight Bearing Restrictions: No   Therapy/Group: Individual Therapy  [name] 05/10/2020, 4:43 PM

## 2020-05-11 ENCOUNTER — Inpatient Hospital Stay (HOSPITAL_COMMUNITY): Payer: Medicare Other

## 2020-05-11 ENCOUNTER — Inpatient Hospital Stay (HOSPITAL_COMMUNITY): Payer: Medicare Other | Admitting: Occupational Therapy

## 2020-05-11 LAB — GLUCOSE, CAPILLARY
Glucose-Capillary: 144 mg/dL — ABNORMAL HIGH (ref 70–99)
Glucose-Capillary: 141 mg/dL — ABNORMAL HIGH (ref 70–99)
Glucose-Capillary: 143 mg/dL — ABNORMAL HIGH (ref 70–99)
Glucose-Capillary: 148 mg/dL — ABNORMAL HIGH (ref 70–99)

## 2020-05-11 MED ORDER — DEXAMETHASONE 2 MG PO TABS
1.0000 mg | ORAL_TABLET | Freq: Three times a day (TID) | ORAL | Status: DC
  Administered 2020-05-11 – 2020-05-12 (×3): 1 mg via ORAL
  Filled 2020-05-11 (×3): qty 1

## 2020-05-11 NOTE — Plan of Care (Signed)
  Problem: Consults Goal: RH GENERAL PATIENT EDUCATION Description: See Patient Education module for education specifics. Outcome: Progressing Goal: Skin Care Protocol Initiated - if Braden Score 18 or less Description: If consults are not indicated, leave blank or document N/A Outcome: Progressing Goal: Diabetes Guidelines if Diabetic/Glucose > 140 Description: If diabetic or lab glucose is > 140 mg/dl - Initiate Diabetes/Hyperglycemia Guidelines & Document Interventions  Outcome: Progressing   Problem: RH BOWEL ELIMINATION Goal: RH STG MANAGE BOWEL WITH ASSISTANCE Description: STG Manage Bowel with mod I Assistance. Outcome: Progressing Goal: RH STG MANAGE BOWEL W/MEDICATION W/ASSISTANCE Description: STG Manage Bowel with Medication with mod I Assistance. Outcome: Progressing   Problem: RH BLADDER ELIMINATION Goal: RH STG MANAGE BLADDER WITH ASSISTANCE Description: STG Manage Bladder With mod I Assistance Outcome: Progressing Goal: RH STG MANAGE BLADDER WITH MEDICATION WITH ASSISTANCE Description: STG Manage Bladder With Medication With mod I Assistance. Outcome: Progressing   Problem: RH SKIN INTEGRITY Goal: RH STG SKIN FREE OF INFECTION/BREAKDOWN Description: Patient will have no signs of infection while on CIR Outcome: Progressing Goal: RH STG MAINTAIN SKIN INTEGRITY WITH ASSISTANCE Description: STG Maintain Skin Integrity With mod I Assistance. Outcome: Progressing Goal: RH STG ABLE TO PERFORM INCISION/WOUND CARE W/ASSISTANCE Description: STG Able To Perform Incision/Wound Care With mod I Assistance. Outcome: Progressing   Problem: RH SAFETY Goal: RH STG ADHERE TO SAFETY PRECAUTIONS W/ASSISTANCE/DEVICE Description: STG Adhere to Safety Precautions With mod I Assistance/Device. Outcome: Progressing Goal: RH STG DECREASED RISK OF FALL WITH ASSISTANCE Description: STG Decreased Risk of Fall With mod I Assistance. Outcome: Progressing   Problem: RH PAIN  MANAGEMENT Goal: RH STG PAIN MANAGED AT OR BELOW PT'S PAIN GOAL Description: Pain scale <4/10 Outcome: Progressing   Problem: RH KNOWLEDGE DEFICIT GENERAL Goal: RH STG INCREASE KNOWLEDGE OF SELF CARE AFTER HOSPITALIZATION Outcome: Progressing

## 2020-05-11 NOTE — Progress Notes (Signed)
Occupational Therapy Session Note  Patient Details  Name: VIRGINIO ISIDORE MRN: [id number] Date of Birth: 1928-09-14  Today's Date: 05/11/2020 OT Individual Time: 1130-1155 OT Individual Time Calculation (min): 25 min    Short Term Goals: Week 1:  OT Short Term Goal 1 (Week 1): STG = LTGs due to ELOS  Skilled Therapeutic Interventions/Progress Updates:    Pt resting in w/c upon arrival with NMES activated on pt's LUE biceps. NMES initiated with earlier OT session.  Pt performing horizontal adduction on half lap tray as instructed. OTA facilitated elbow flexion/extension and pronation/supination. Tactile cues provided for proper movement without compensatory techniques. Pt remained seated in w/c with all needs within reach and half lap tray in place.  Therapy Documentation Precautions:  Precautions Precautions: Cervical, Fall Restrictions Weight Bearing Restrictions: No   Pain: Pt denies pain  Therapy/Group: Individual Therapy  Leroy Libman 05/11/2020, 12:16 PM

## 2020-05-11 NOTE — Progress Notes (Signed)
Physical Therapy Session Note  Patient Details  Name: [name] [name] MRN: [id number] Date of Birth: 01/15/1929  Today's Date: 05/11/2020 PT Individual Time: 0900-1000  + 1445-1530 PT Individual Time Calculation (min): 60 min + 30 min  Short Term Goals: Week 1:  PT Short Term Goal 1 (Week 1): STG= LTG due to ELOS  Skilled Therapeutic Interventions/Progress Updates:   1st session:   Pt received supine in bed, agreeable to PT session. Pt reports mild posterior cervical neck pain, unrated. Supine<>sit via log rolling with HOB flat with CGA, able to maintain siting EOB with supervision. Shoes donned with modA while seated EOB and pt performed stand<>pivot transfer with CGA from EOB to w/c. Once in w/c, pt reports need for toileting, ambulated from w/c to bathroom with minA and no AD, stand<>sit on toilet with minA for controlled lowering. Pt successful in voiding and ambulated back to his chair with CGA and RW.  Pt transported in w/c with totalA for time management outside near [facility] to practice outdoor ambulation. Pt ambulated ~163ft weaving in/out of pillars with CGA and RW. After seated rest ambulated ~250ft with CGA/minA and RW while ambulating up/down moderately inclined ramp. Pt with difficulty keeping RW on pavement and maintaining straight path 2/2 LUE weakness. x1 LOB laterally while negotiating steep ramp requiring minA for correction. Performed 3x5 unsupported sit<>stands from w/c level, requiring minA to facilitate upright with emphasis on hip extension. Pt wheeled back to rehab floor where he negotiated up/down x12 steps with minA and U HR support, step-to pattern and increased difficulty with descent > ascent. Cues for upright posture, sequencing, technique (up with LLE leading, down with RLE leading). Pt then completed B hip marches and seated LAQ with 3# ankle weight with repetitions to fatigue, focusing on hip flexion and quad strength. Pt transported back to his room and ended session  seated in w/c, needs in reach.   2nd session: Pt received sitting in w/c, agreeable to PT session. Pt ambulated from his bedroom to small therapy gym (>218ft) with CGA and RW, cues for increasing B step length and focusing on R heel strike. After seated rest break, pt performed standing toe taps on 6inch platform with RW support and minA guard for stability. Performed twice with repetitions until fatigue. Increased difficulty with R hip flexion for standing toe taps and a few instances of catching toe on platform during hip flexion. Pt then performed standing ball raises with BUE grasping small blue ball, therapist performing AAROM for LUE flexion. Pt then performed seated there-ex with red weightless dowel rod including: -1x10 bicep curls -1x10 chest press (therapist supporting elbow) -1x10 shldr press (therapist supporting elbow)  Pt ambulated back to his room from small therapy gym (>212ft) with CGA and RW, stand>sit with CGA to EOB, and then sit>supine with supervision via log rolling technique. Pt ended session semi-reclined in bed, needs in reach, 3/4 bed rails up, bed alarm on.  Therapy Documentation Precautions:  Precautions Precautions: Cervical, Fall Restrictions Weight Bearing Restrictions: No Pain: Pain Assessment Pain Scale: 0-10 Pain Score: 6    Therapy/Group: Individual Therapy  Chantelle Verdi P [name] PT 05/11/2020, 9:50 AM

## 2020-05-11 NOTE — Progress Notes (Signed)
[facility] PHYSICAL MEDICINE & REHABILITATION PROGRESS NOTE  Subjective/Complaints: No complaints this morning Pulse low, other vitals stable  ROS: Patient denies fever, rash, sore throat, blurred vision, nausea, vomiting, diarrhea, cough, shortness of breath or chest pain,   headache, or mood change.    Objective: Vital Signs: Blood pressure 131/67, pulse (!) 53, temperature 97.7 F (36.5 C), resp. rate 16, height 5\' 11"  (1.803 m), weight 86.3 kg, SpO2 99 %. No results found. No results for input(s): WBC, HGB, HCT, PLT in the last 72 hours. Recent Labs    05/09/20 0611  NA 133*  K 4.8  CL 101  CO2 23  GLUCOSE 156*  BUN 67*  CREATININE 1.75*  CALCIUM 9.5    Physical Exam: BP 131/67 (BP Location: Right Arm)   Pulse (!) 53   Temp 97.7 F (36.5 C)   Resp 16   Ht 5\' 11"  (1.803 m)   Wt 86.3 kg   SpO2 99%   BMI 26.54 kg/m  General: Alert and oriented x 3, No apparent distress HEENT: Head is normocephalic, atraumatic, PERRLA, EOMI, sclera anicteric, oral mucosa pink and moist, dentition intact, ext ear canals clear,  Neck: Supple without JVD or lymphadenopathy Heart: Reg rate and rhythm. No murmurs rubs or gallops Chest: CTA bilaterally without wheezes, rales, or rhonchi; no distress Abdomen: Soft, non-tender, non-distended, bowel sounds positive. Extremities: No clubbing, cyanosis, or edema. Pulses are 2+ Skin: cervical wound CDI Motor: RUE/RLE: 5/5 proximal distal RLE: 4+/5 proximal distally LUE: Shoulder abduction 2-/5, EF: 2/5, elbow extension 3+/5, handgrip 4+/5 Ambulating with RW with PT. Decreased sensation left hand and in variable pattern both legs Psych: Pt's affect is appropriate. Pt is cooperative     Assessment/Plan: 1. Functional deficits secondary to C5 nerve root palsy status post C3-C6 posterior fusion which require 3+ hours per day of interdisciplinary therapy in a comprehensive inpatient rehab setting.  Physiatrist is providing close team  supervision and 24 hour management of active medical problems listed below.  Physiatrist and rehab team continue to assess barriers to discharge/monitor patient progress toward functional and medical goals  Care Tool:  Bathing    Body parts bathed by patient: Left arm, Chest, Abdomen, Front perineal area, Right upper leg, Left upper leg, Face, Buttocks, Right lower leg, Left lower leg   Body parts bathed by helper: Right arm     Bathing assist Assist Level: Minimal Assistance - Patient > 75%     Upper Body Dressing/Undressing Upper body dressing   What is the patient wearing?: Pull over shirt    Upper body assist Assist Level: Supervision/Verbal cueing    Lower Body Dressing/Undressing Lower body dressing      What is the patient wearing?: Pants     Lower body assist Assist for lower body dressing: Supervision/Verbal cueing     Toileting Toileting    Toileting assist Assist for toileting: Contact Guard/Touching assist     Transfers Chair/bed transfer  Transfers assist     Chair/bed transfer assist level: Supervision/Verbal cueing Chair/bed transfer assistive device: Programmer, multimedia   Ambulation assist      Assist level: Supervision/Verbal cueing Assistive device: Walker-rolling Max distance: 300   Walk 10 feet activity   Assist     Assist level: Supervision/Verbal cueing Assistive device: Walker-rolling   Walk 50 feet activity   Assist    Assist level: Supervision/Verbal cueing Assistive device: Walker-rolling    Walk 150 feet activity   Assist    Assist  level: Supervision/Verbal cueing Assistive device: Walker-rolling    Walk 10 feet on uneven surface  activity   Assist     Assist level: Contact Guard/Touching assist Assistive device: Aeronautical engineer Will patient use wheelchair at discharge?: No             Wheelchair 50 feet with 2 turns activity    Assist             Wheelchair 150 feet activity     Assist            Medical Problem List and Plan: 1.  Impaired function/C5 nerve root dyspraxia/palsy s/p C3-C6 posterior fusion with LUE weakness/N/T and impaired balance, gait and LUE movement  Continue CIR  Decrease dexamethasone to 1mg  q8H.  2.  Antithrombotics: -DVT/anticoagulation:  Mechanical: Sequential compression devices, below knee Bilateral lower extremities             -antiplatelet therapy: Aspirin restarted 3. Pain Management:  Changed Oxy to Norco and robaxin as needed. Requiring Norco. Helping with his neck and back pain.    -discussed appropriate positioning of LUE  -pain stable   Monitor with increased exertion 4. Mood: LCSW to follow for evaluation and support.              -antipsychotic agents: N/A 5. Neuropsych: This patient is capable of making decisions on his own behalf. 6. Skin/Wound Care: Routine pressure relief measures. D/c IV 7. Fluids/Electrolytes/Nutrition: Monitor I/Os.  8. HTN: Monitor BP --continue coreg and Lisinopril daily.  Moderate increased mobility  8/28: Elevated. Increase Flomax to 0.8mg .   8/31: elevated: add banana with breakfast  9/2: better controlled 9. BPH: On Flomax daily.   Monitor for retention 10.  AKI on CKD:   Cr. 2.06 on 8/27, severely elevated BUN   Labs ordered for Monday, echo reviewed from 2007- normal EF  NaCL fluid bolus ordered  Encourage fluids  8/30. Labs showing improvement today.    -daily bmet   -consider d/c of lisinopril if not continued improvement  9/1: Cr 1.75: encouraged hydration 11. Steroid induced hyperglycemia on T2DM with hyperglycemia: Diet controlled. Monitor with ac/hs and use SSI for elevated BS especially as on steroids.   8/29: elevated. Dietary consult ordered  9/2: better controlled. Continue steroid taper 12. Drug induced constipation: Continue senna S.  Added miralax also and order enema tonight.   Bowel meds increased again on  8/27  8/31: start Miralax  9/1: Had BM after mag citrate 13. Hyponatremia  Na 132 on 8/27--> 131 8/30 14.  Hypoalbuminemia  Supplement initiated on 8/27 15.  Transaminitis  AST elevated on 8/27-->stable to improved 8/30 13.  Leukocytosis-likely secondary to steroids  WBC 13.3 on 8/27--> 10.2 8/30  Afebrile  Continue to monitor 14. ?Acute on Anemia of chronic disease   Hb 11.9 on 8/27-->stable at 11.2 8/30    15.  Wheezing-patient states baseline, states he did not take any medications at home for this  Chest x-ray stable  As needed albuterol  16. Insomnia: start melatonin HS  LOS: 7 days A FACE TO FACE EVALUATION WAS PERFORMED  [name] [name] 05/11/2020, 12:28 PM

## 2020-05-11 NOTE — Progress Notes (Signed)
Occupational Therapy Session Note  Patient Details  Name: [name] [name] MRN: [id number] Date of Birth: March 07, 1929  Today's Date: 05/11/2020 OT Individual Time: 3009-2330 OT Individual Time Calculation (min): 45 min    Short Term Goals: Week 1:  OT Short Term Goal 1 (Week 1): STG = LTGs due to ELOS  Skilled Therapeutic Interventions/Progress Updates:    Pt seen this session to facilitate UB/LB dressing. Pt needed more cuing today, but he also stated he was very fatigued after PT session.  Placed elastic laces in shoes and pt able to don shoes with S.  LUE bicep stimulation NMR with estim using 35 pps at intensity 8/10 for 5 sec on and 5 off while pt worked on aarom of elbow with forearm resting on half lap tray sliding on a pillow case.  Pt tolerated estim well.  He has minimal elbow flexion (5%) against gravity with more with gravity eliminated (40-50%).  Pt worked with estim for 15 minutes with therapist present and cuing for A/arom.  Good response and no adverse skin reactions.  Pt opted to continue using for 45 min unattended estim.   Removed it at noon, and slight pink tone to skin where pads were. Pt stated he was not bothered by it.   Pt resting in wc with all needs met.  Therapy Documentation Precautions:  Precautions Precautions: Cervical, Fall Restrictions Weight Bearing Restrictions: No  Vital Signs: Therapy Vitals Temp: 97.7 F (36.5 C) Pulse Rate: (!) 53 Resp: 16 BP: 131/67 Patient Position (if appropriate): Lying Oxygen Therapy SpO2: 99 % O2 Device: Room Air Pain: Pain Assessment Pain Scale: 0-10 Pain Score: 6  ADL: ADL Eating: Set up Where Assessed-Eating: Chair Grooming: Setup Where Assessed-Grooming: Sitting at sink Upper Body Bathing: Minimal assistance Where Assessed-Upper Body Bathing: Shower Lower Body Bathing: Supervision/safety Where Assessed-Lower Body Bathing: Shower Upper Body Dressing: Supervision/safety Where Assessed-Upper Body  Dressing: Chair Lower Body Dressing: Minimal assistance Where Assessed-Lower Body Dressing: Chair Toileting: Supervision/safety Where Assessed-Toileting: Glass blower/designer: Therapist, music Method: Magazine features editor: Curator Method: Heritage manager: Shower seat with back   Therapy/Group: Individual Therapy  Kimoni Pagliarulo 05/11/2020, 8:28 AM

## 2020-05-11 NOTE — Progress Notes (Signed)
Occupational Therapy Session Note  Patient Details  Name: JESSEY STEHLIN MRN: [id number] Date of Birth: June 05, 1929  Today's Date: 05/11/2020 OT Individual Time: 1430-1500 OT Individual Time Calculation (min): 30 min    Short Term Goals: Week 1:  OT Short Term Goal 1 (Week 1): STG = LTGs due to ELOS  Skilled Therapeutic Interventions/Progress Updates:    Pt sitting up in w/c no c/o pain, agreeable to OT session.  Pt transported to dayroom to complete LUE strengthening including AAROM biceps curls 3 ways (sup, neutral, pro) 3 x 15 reps and wrist extension using 1 lb free weight 3 x 15 reps. Pt needing intermittent VCs and TCs to improve body mechanics.  Pt transported back to room, call bell in reach, seat alarm on.  Therapy Documentation Precautions:  Precautions Precautions: Cervical, Fall Restrictions Weight Bearing Restrictions: No   Therapy/Group: Individual Therapy  [name] 05/11/2020, 3:05 PM

## 2020-05-11 NOTE — Progress Notes (Signed)
Patient ID: [name] [name], male   DOB: 01/17/1929, 86 y.o.   MRN: [id number] Follow up with the patient regarding education reviewed previously. Discussed creatine elevation and encourage fluids. Discussed information with daughter as well and she reports understanding of need for fluids, management of DM. Reported she felt comfortable with the information reviewed with her during family education sessions. Reviewed discharge process with both and noted no other concerns at present. Margarito Liner

## 2020-05-12 ENCOUNTER — Inpatient Hospital Stay (HOSPITAL_COMMUNITY): Payer: Medicare Other | Admitting: Occupational Therapy

## 2020-05-12 ENCOUNTER — Inpatient Hospital Stay (HOSPITAL_COMMUNITY): Payer: Medicare Other | Admitting: Physical Therapy

## 2020-05-12 LAB — GLUCOSE, CAPILLARY
Glucose-Capillary: 126 mg/dL — ABNORMAL HIGH (ref 70–99)
Glucose-Capillary: 124 mg/dL — ABNORMAL HIGH (ref 70–99)
Glucose-Capillary: 137 mg/dL — ABNORMAL HIGH (ref 70–99)
Glucose-Capillary: 124 mg/dL — ABNORMAL HIGH (ref 70–99)

## 2020-05-12 MED ORDER — DEXAMETHASONE 2 MG PO TABS
1.0000 mg | ORAL_TABLET | Freq: Two times a day (BID) | ORAL | Status: DC
  Administered 2020-05-12 – 2020-05-15 (×6): 1 mg via ORAL
  Filled 2020-05-12 (×6): qty 1

## 2020-05-12 NOTE — Progress Notes (Signed)
Occupational Therapy Session Note  Patient Details  Name: [name] [name] MRN: [id number] Date of Birth: 1928-12-09  Today's Date: 05/12/2020 OT Individual Time: 2376-2831 OT Individual Time Calculation (min): 28 min    Short Term Goals: Week 1:  OT Short Term Goal 1 (Week 1): STG = LTGs due to ELOS   Skilled Therapeutic Interventions/Progress Updates:    Pt greeted at time of session sitting up in recliner resting comfortably, no c/o pain throughout. Offered ADL, declined as he had already washed up and dressed this am. Pt did want to wash dentures thoroughly, ambulated to sink level CGA with RW and static stand for 3-4 minutes to wash dentures, CS/CGA for standing balance during oral care and grooming. Ambulated back to recliner CGA with RW and in seated performed LUE AAROM for bicep activation/elbow flexion with therapist tapping muscle for 2x10, tactile cues to prevent compensatory movement at shoulder. Some difficulty noted with supination as well. Alarm on, call bell in reach.   Therapy Documentation Precautions:  Precautions Precautions: Cervical, Fall Restrictions Weight Bearing Restrictions: No     Therapy/Group: Individual Therapy  Viona Gilmore 05/12/2020, 9:34 AM

## 2020-05-12 NOTE — Progress Notes (Signed)
Physical Therapy Session Note  Patient Details  Name: [name] [name] MRN: [id number] Date of Birth: [birth year]  Today's Date: 05/12/2020 PT Individual Time: 1103-1159 PT Individual Time Calculation (min): 56 min   Short Term Goals: Week 1:  PT Short Term Goal 1 (Week 1): STG= LTG due to ELOS  Skilled Therapeutic Interventions/Progress Updates: Pt presents sitting on recliner and agreeable to therapy.  Pt handed off from OT.  Pt transfers multiple trials sit to stand w/ supervision throughout session from recliner and mat table w/ attempted use of LUE after self-manual placement of hand.  Pt ambulated multiple trials w/ RW and supervision, verbal cues for maintaining midline position in RW.  Pt amb up to 300' per trial.  Pt performed standing marches, toetaps to small cone w/ HHA, partial squats, standing on cushioned surface w/o UE support and then w/ decreasing BOS > 2'.  Pt w/ noted increased sway but small rotation.  Pt negotiated (4) 6" steps w/ right handrail and CGA, verbal cues for sequencing and maintaining BOS, step -to gait pattern.  Pt returned to room and to recliner.  Seat alarm on and all needs in reach.     Therapy Documentation Precautions:  Precautions Precautions: Cervical, Fall Restrictions Weight Bearing Restrictions: No General:   Vital Signs: Therapy Vitals BP: 138/73 Pain:4/10 to neck, pt will request meds if needed. Pain Assessment Pain Scale: 0-10 Pain Score: 3  Pain Location: Back Pain Orientation: Lower Pain Descriptors / Indicators: Aching;Discomfort Pain Onset: On-going Patients Stated Pain Goal: 2 Pain Intervention(s): Medication (See eMAR) Mobility:    Therapy/Group: Individual Therapy  [name] [name] 05/12/2020, 12:10 PM

## 2020-05-12 NOTE — Progress Notes (Signed)
Physical Therapy Session Note  Patient Details  Name: [name] [name] MRN: [id number] Date of Birth: 1929/07/09  Today's Date: 05/12/2020 PT Individual Time: 2902-1115 PT Individual Time Calculation (min): 40 min   Short Term Goals: Week 1:  PT Short Term Goal 1 (Week 1): STG= LTG due to ELOS  Skilled Therapeutic Interventions/Progress Updates:      Pt received sitting on BSC and agreeable to PT. PT treatment session focused on dynamic transfer training on/off BSC and WC with supervision assist for sit stand and no AD and supervision assist for stand pivot with RW, cues for UE placement to push from sitting surface. ADL training to perform pericare following BM as well as upper and lower body hygiene with wash cloth. Supervision assist throughout with min cues for use of RUE to aid LUE to attain improve elbow flexion. Pt then perform upper and lower body dressing with min assist to orient pants and shirt with use of modified hemi technique due to RUE weakness.   Gait training with RW through hall x 129ft and supervision assist overall from PT. Min cues for AD management in turns as well as improved posture and improve symmetry in shoulders.   Patient returned to room and performed stand pivot to recliner with supervision assist and RW. Pt left sitting in recliner with call bell in reach and all needs met.      Therapy Documentation Precautions:  Precautions Precautions: Cervical, Fall Restrictions Weight Bearing Restrictions: No Vital Signs: Therapy Vitals Temp: 98 F (36.7 C) Pulse Rate: (!) 57 Resp: 18 BP: (!) 152/72 Patient Position (if appropriate): Lying Oxygen Therapy SpO2: 99 % O2 Device: Room Air Pain: Pain Assessment Pain Scale: 0-10 Pain Score: 4  Pain Location: Back Pain Orientation: Lower Pain Descriptors / Indicators: Aching Pain Intervention(s): Medication (See eMAR)    Therapy/Group: Individual Therapy  Lorie Phenix 05/12/2020, 8:53 AM

## 2020-05-12 NOTE — Progress Notes (Signed)
[facility] PHYSICAL MEDICINE & REHABILITATION PROGRESS NOTE  Subjective/Complaints: Slept from 10-2 then couldn't get back to sleep- agreeable to increasing melatonin Some diarrhea. Some pain at incision site  ROS: Patient denies fever, rash, sore throat, blurred vision, nausea, vomiting, diarrhea, cough, shortness of breath or chest pain,   headache, or mood change.    Objective: Vital Signs: Blood pressure (!) 152/72, pulse (!) 57, temperature 98 F (36.7 C), resp. rate 18, height 5\' 11"  (1.803 m), weight 86.3 kg, SpO2 99 %. No results found. No results for input(s): WBC, HGB, HCT, PLT in the last 72 hours. No results for input(s): NA, K, CL, CO2, GLUCOSE, BUN, CREATININE, CALCIUM in the last 72 hours.  Physical Exam: BP (!) 152/72 (BP Location: Right Arm)   Pulse (!) 57   Temp 98 F (36.7 C)   Resp 18   Ht 5\' 11"  (1.803 m)   Wt 86.3 kg   SpO2 99%   BMI 26.54 kg/m  General: Alert and oriented x 3, No apparent distress HEENT: Head is normocephalic, atraumatic, PERRLA, EOMI, sclera anicteric, oral mucosa pink and moist, dentition intact, ext ear canals clear,  Neck: Supple without JVD or lymphadenopathy Heart: Reg rate and rhythm. No murmurs rubs or gallops Chest: CTA bilaterally without wheezes, rales, or rhonchi; no distress Abdomen: Soft, non-tender, non-distended, bowel sounds positive. Extremities: No clubbing, cyanosis, or edema. Pulses are 2+ Skin: cervical wound CDI Motor: RUE/RLE: 5/5 proximal distal RLE: 4+/5 proximal distally LUE: Shoulder abduction 2-/5, EF: 2/5, elbow extension 3+/5, handgrip 4+/5 Ambulating with RW with PT. Decreased sensation left hand and in variable pattern both legs Psych: Pt's affect is appropriate. Pt is cooperative  Assessment/Plan: 1. Functional deficits secondary to C5 nerve root palsy status post C3-C6 posterior fusion which require 3+ hours per day of interdisciplinary therapy in a comprehensive inpatient rehab  setting.  Physiatrist is providing close team supervision and 24 hour management of active medical problems listed below.  Physiatrist and rehab team continue to assess barriers to discharge/monitor patient progress toward functional and medical goals  Care Tool:  Bathing    Body parts bathed by patient: Left arm, Chest, Abdomen, Front perineal area, Right upper leg, Left upper leg, Face, Buttocks, Right lower leg, Left lower leg   Body parts bathed by helper: Right arm     Bathing assist Assist Level: Minimal Assistance - Patient > 75%     Upper Body Dressing/Undressing Upper body dressing   What is the patient wearing?: Pull over shirt    Upper body assist Assist Level: Supervision/Verbal cueing    Lower Body Dressing/Undressing Lower body dressing      What is the patient wearing?: Pants     Lower body assist Assist for lower body dressing: Supervision/Verbal cueing     Toileting Toileting    Toileting assist Assist for toileting: Contact Guard/Touching assist     Transfers Chair/bed transfer  Transfers assist     Chair/bed transfer assist level: Supervision/Verbal cueing Chair/bed transfer assistive device: Programmer, multimedia   Ambulation assist      Assist level: Supervision/Verbal cueing Assistive device: Walker-rolling Max distance: 300   Walk 10 feet activity   Assist     Assist level: Supervision/Verbal cueing Assistive device: Walker-rolling   Walk 50 feet activity   Assist    Assist level: Supervision/Verbal cueing Assistive device: Walker-rolling    Walk 150 feet activity   Assist    Assist level: Supervision/Verbal cueing Assistive device: Walker-rolling  Walk 10 feet on uneven surface  activity   Assist     Assist level: Contact Guard/Touching assist Assistive device: Walker-rolling   Wheelchair     Assist Will patient use wheelchair at discharge?: No             Wheelchair 50  feet with 2 turns activity    Assist            Wheelchair 150 feet activity     Assist            Medical Problem List and Plan: 1.  Impaired function/C5 nerve root dyspraxia/palsy s/p C3-C6 posterior fusion with LUE weakness/N/T and impaired balance, gait and LUE movement  Continue CIR  Decrease dexamethasone to 1mg  BID 2.  Antithrombotics: -DVT/anticoagulation:  Mechanical: Sequential compression devices, below knee Bilateral lower extremities             -antiplatelet therapy: Aspirin restarted 3. Pain Management:  Changed Oxy to Norco and robaxin as needed. Requiring Norco. Helping with his neck and back pain.    -discussed appropriate positioning of LUE  -pain stable   Monitor with increased exertion 4. Mood: LCSW to follow for evaluation and support.              -antipsychotic agents: N/A 5. Neuropsych: This patient is capable of making decisions on his own behalf. 6. Skin/Wound Care: Routine pressure relief measures. D/c IV 7. Fluids/Electrolytes/Nutrition: Monitor I/Os.  8. HTN: Monitor BP --continue coreg and Lisinopril daily.  Moderate increased mobility  8/28: Elevated. Increase Flomax to 0.8mg .   8/31: elevated: add banana with breakfast  9/3: still elevated. May improve as we taper the steroids.  9. BPH: On Flomax daily.   Monitor for retention 10.  AKI on CKD:   Cr. 2.06 on 8/27, severely elevated BUN   Labs ordered for Monday, echo reviewed from 2007- normal EF  NaCL fluid bolus ordered  Encourage fluids  8/30. Labs showing improvement today.    -daily bmet   -consider d/c of lisinopril if not continued improvement  9/1: Cr 1.75: encouraged hydration 11. Steroid induced hyperglycemia on T2DM with hyperglycemia: Diet controlled. Monitor with ac/hs and use SSI for elevated BS especially as on steroids.   8/29: elevated. Dietary consult ordered  9/3: better controlled. Continue steroid taper 12. Drug induced constipation: Continue senna S.   Added miralax also and order enema tonight.   Bowel meds increased again on 8/27  8/31: start Miralax  9/1: Had BM after mag citrate 13. Hyponatremia  Na 132 on 8/27--> 131 8/30 14.  Hypoalbuminemia  Supplement initiated on 8/27 15.  Transaminitis  AST elevated on 8/27-->stable to improved 8/30 13.  Leukocytosis-likely secondary to steroids  WBC 13.3 on 8/27--> 10.2 8/30  Afebrile  Continue to monitor 14. ?Acute on Anemia of chronic disease   Hb 11.9 on 8/27-->stable at 11.2 8/30    15.  Wheezing-patient states baseline, states he did not take any medications at home for this  Chest x-ray stable  As needed albuterol  16. Insomnia: start melatonin HS  LOS: 8 days A FACE TO FACE EVALUATION WAS PERFORMED  Clide Deutscher Tsuyako Jolley 05/12/2020, 8:53 AM

## 2020-05-12 NOTE — Progress Notes (Signed)
Occupational Therapy Weekly Progress Note  Patient Details  Name: [name] [name] MRN: [id number] Date of Birth: May 01, 1929  Beginning of progress report period: May 05, 2020 End of progress report period: May 12, 2020  Today's Date: 05/12/2020 OT Individual Time: 1015-1100 OT Individual Time Calculation (min): 45 min   STGs not set based on ELOS.   Patient continues to demonstrate the following deficits: muscle weakness, unbalanced muscle activation and decreased coordination and decreased standing balance and decreased balance strategies and therefore will continue to benefit from skilled OT intervention to enhance overall performance with BADL.  Patient progressing toward long term goals..  Continue plan of care.  OT Short Term Goals Week 1:  OT Short Term Goal 1 (Week 1): STG = LTGs due to ELOS Week 2:  OT Short Term Goal 1 (Week 2): STGs = LTGs  Skilled Therapeutic Interventions/Progress Updates:    Pt received in recliner and ambulated to gym with RW and set up at table to rest LUE on table.   LUE bicep stimulationNMRwith estim using 35 pps at intensity 8/10 for 5 sec on and 5 off while pt worked on aarom of elbow with forearm resting on table sliding on a pillow case for 10 min and then 10 min of a/arom with B hands on dowel bar with bicep curls.   Pt tolerated estim well. He has minimal elbow flexion (5%) against gravity with more with gravity eliminated (40-50%).  Good response and no adverse skin reactions, slight pink tone to skin where pads.  Pt ambulated back to room with CGA and set up in recliner. PT arrived for his next session.   Therapy Documentation Precautions:  Precautions Precautions: Cervical, Fall Restrictions Weight Bearing Restrictions: No    Vital Signs: Therapy Vitals Temp: 97.7 F (36.5 C) Pulse Rate: (!) 58 Resp: 17 BP: (!) 149/68 Patient Position (if appropriate): Lying Oxygen Therapy SpO2: 100 % O2 Device: Room Air     Pain:   No c/o pain   ADL: ADL Eating: Set up Where Assessed-Eating: Chair Grooming: Setup Where Assessed-Grooming: Sitting at sink Upper Body Bathing: Minimal assistance Where Assessed-Upper Body Bathing: Shower Lower Body Bathing: Supervision/safety Where Assessed-Lower Body Bathing: Shower Upper Body Dressing: Supervision/safety Where Assessed-Upper Body Dressing: Chair Lower Body Dressing: Minimal assistance Where Assessed-Lower Body Dressing: Chair Toileting: Supervision/safety Where Assessed-Toileting: Glass blower/designer: Therapist, music Method: Magazine features editor: Curator Method: Heritage manager: Shower seat with back   Therapy/Group: Individual Therapy  [place] 05/12/2020, 1:04 PM

## 2020-05-12 NOTE — Plan of Care (Signed)
  Problem: Consults Goal: RH GENERAL PATIENT EDUCATION Description: See Patient Education module for education specifics. Outcome: Progressing Goal: Skin Care Protocol Initiated - if Braden Score 18 or less Description: If consults are not indicated, leave blank or document N/A Outcome: Progressing Goal: Diabetes Guidelines if Diabetic/Glucose > 140 Description: If diabetic or lab glucose is > 140 mg/dl - Initiate Diabetes/Hyperglycemia Guidelines & Document Interventions  Outcome: Progressing   Problem: RH BOWEL ELIMINATION Goal: RH STG MANAGE BOWEL WITH ASSISTANCE Description: STG Manage Bowel with mod I Assistance. Outcome: Progressing Goal: RH STG MANAGE BOWEL W/MEDICATION W/ASSISTANCE Description: STG Manage Bowel with Medication with mod I Assistance. Outcome: Progressing   Problem: RH BLADDER ELIMINATION Goal: RH STG MANAGE BLADDER WITH ASSISTANCE Description: STG Manage Bladder With mod I Assistance Outcome: Progressing Goal: RH STG MANAGE BLADDER WITH MEDICATION WITH ASSISTANCE Description: STG Manage Bladder With Medication With mod I Assistance. Outcome: Progressing   Problem: RH SKIN INTEGRITY Goal: RH STG SKIN FREE OF INFECTION/BREAKDOWN Description: Patient will have no signs of infection while on CIR Outcome: Progressing Goal: RH STG MAINTAIN SKIN INTEGRITY WITH ASSISTANCE Description: STG Maintain Skin Integrity With mod I Assistance. Outcome: Progressing Goal: RH STG ABLE TO PERFORM INCISION/WOUND CARE W/ASSISTANCE Description: STG Able To Perform Incision/Wound Care With mod I Assistance. Outcome: Progressing   Problem: RH SAFETY Goal: RH STG ADHERE TO SAFETY PRECAUTIONS W/ASSISTANCE/DEVICE Description: STG Adhere to Safety Precautions With mod I Assistance/Device. Outcome: Progressing Goal: RH STG DECREASED RISK OF FALL WITH ASSISTANCE Description: STG Decreased Risk of Fall With mod I Assistance. Outcome: Progressing   Problem: RH PAIN  MANAGEMENT Goal: RH STG PAIN MANAGED AT OR BELOW PT'S PAIN GOAL Description: Pain scale <4/10 Outcome: Progressing   Problem: Consults Goal: RH SPINAL CORD INJURY PATIENT EDUCATION Description:  See Patient Education module for education specifics.  Outcome: Progressing Goal: Skin Care Protocol Initiated - if Braden Score 18 or less Description: If consults are not indicated, leave blank or document N/A Outcome: Progressing Goal: Nutrition Consult-if indicated Outcome: Progressing Goal: Diabetes Guidelines if Diabetic/Glucose > 140 Description: If diabetic or lab glucose is > 140 mg/dl - Initiate Diabetes/Hyperglycemia Guidelines & Document Interventions  Outcome: Progressing   Problem: SCI BOWEL ELIMINATION Goal: RH STG MANAGE BOWEL WITH ASSISTANCE Description: STG Manage Bowel with Assistance. Outcome: Progressing   Problem: SCI BLADDER ELIMINATION Goal: RH STG MANAGE BLADDER WITH ASSISTANCE Description: STG Manage Bladder With mod I Assistance Outcome: Progressing Goal: RH STG MANAGE BLADDER WITH MEDICATION WITH ASSISTANCE Description: STG Manage Bladder With Medication With Assistance. Outcome: Progressing   Problem: RH SKIN INTEGRITY Goal: RH STG MAINTAIN SKIN INTEGRITY WITH ASSISTANCE Description: STG Maintain Skin Integrity With Assistance. Outcome: Progressing Goal: RH STG ABLE TO PERFORM INCISION/WOUND CARE W/ASSISTANCE Description: STG Able To Perform Incision/Wound Care With Assistance. Outcome: Progressing   Problem: RH SAFETY Goal: RH STG ADHERE TO SAFETY PRECAUTIONS W/ASSISTANCE/DEVICE Description: STG Adhere to Safety Precautions With Assistance/Device. Outcome: Progressing

## 2020-05-13 ENCOUNTER — Inpatient Hospital Stay (HOSPITAL_COMMUNITY): Payer: Medicare Other | Admitting: Physical Therapy

## 2020-05-13 ENCOUNTER — Inpatient Hospital Stay (HOSPITAL_COMMUNITY): Payer: Medicare Other | Admitting: Occupational Therapy

## 2020-05-13 LAB — GLUCOSE, CAPILLARY
Glucose-Capillary: 125 mg/dL — ABNORMAL HIGH (ref 70–99)
Glucose-Capillary: 202 mg/dL — ABNORMAL HIGH (ref 70–99)
Glucose-Capillary: 140 mg/dL — ABNORMAL HIGH (ref 70–99)
Glucose-Capillary: 135 mg/dL — ABNORMAL HIGH (ref 70–99)

## 2020-05-13 MED ORDER — BENZOCAINE 10 % MT GEL
Freq: Every day | OROMUCOSAL | Status: DC | PRN
  Filled 2020-05-13: qty 9

## 2020-05-13 NOTE — Progress Notes (Signed)
Physical Therapy Session Note  Patient Details  Name: [name] [name] MRN: [id number] Date of Birth: Jun 12, 1929  Today's Date: 05/13/2020 PT Individual Time: 9767-3419 PT Individual Time Calculation (min): 50 min   Short Term Goals: Week 1:  PT Short Term Goal 1 (Week 1): STG= LTG due to ELOS  Skilled Therapeutic Interventions/Progress Updates:    Pt received supine in bed and agreeable to therapy session though pt appears sad/discouraged. Pt reports that he is very upset that he cannot move his L arm and feels that he has made no progress with this specific impairment during his CIR stay. Therapist provided patient with emotional support and inquired if pt would like to discuss this further with the MD and pt reports he would like to have a better understanding of why he is experiencing this symptom - Dr. Dagoberto Ligas notified and will have primary MD discuss this with pt further on Monday, pt made aware of plan. Pt calls his daughter for emotional support as well and both are discouraged that pt appears to have decreased mobility since his surgery - therapist providing emotional support and encouragement with pt's mood improving. Pt agreeable to therapy session. Supine>sitting R EOB, pt recalling need for logroll technique without cuing, using bedrails with supervision. Sit<>stands using RW with supervision during session - pt continues to need R UE to support placing L hand on/off RW. Gait training ~136ft x2 to/from therapy gym using RW with CGA progressed to supervision - demos decreased gait speed but overall improving stability. Seated L UE NMR using neuromuscular electrical stimulation via empi unit on small muscle NMES setting - tolerated intensity of 20 with on/off time of 10seconds for a total of 10 minutes - performed active assisted L UE elbow flexion with visual target of bringing cup of water to mouth - demos slight increase in muscle recruitment using e-stim but pt unable to move arm against  gravity without total assist. No adverse reaction to e-stim with only some pink skin tone noted where the pads were located. Ambulated in/out of bathroom using RW with close supervision. Standing LB clothing management with pt having to use R UE solely as unable to use L UE to assist with this task - continent of bladder. At end of session, pt left seated in recliner with needs in reach and chair alarm on.  Therapy Documentation Precautions:  Precautions Precautions: Cervical, Fall Restrictions Weight Bearing Restrictions: No  Pain:   Reports he continues to have post surgical cervical spine pain - denies intervention during session - therapist cuing to decrease compensatory muscle recruitment during L UE NMR to avoid excessive activation of scapular elevators.   Therapy/Group: Individual Therapy  Tawana Scale , PT, DPT, CSRS  05/13/2020, 7:52 AM

## 2020-05-13 NOTE — Progress Notes (Signed)
Occupational Therapy Session Note  Patient Details  Name: DOMENIC SCHOENBERGER MRN: [id number] Date of Birth: [birth year]  Today's Date: 05/13/2020 OT Individual Time: 0388-8280 OT Individual Time Calculation (min): 40 min   Short Term Goals: Week 2:  OT Short Term Goal 1 (Week 2): STGs = LTGs  Skilled Therapeutic Interventions/Progress Updates:    Pt greeted in the recliner with min c/o back pain, but pain manageable for tx. He wanted to work on his Lt arm, presents with distal>proximal strength. Lt UE NMR via use of UE ranger and also when engaging in an activity involving active assist arm glides using rings and arched tubing. He needed mod facilitation for shoulder elevation during this activity. At end of session pt remained in the recliner with all needs within reach.    Therapy Documentation Precautions:  Precautions Precautions: Cervical, Fall Restrictions Weight Bearing Restrictions: No Vital Signs:  Pain:   ADL: ADL Eating: Set up Where Assessed-Eating: Chair Grooming: Setup Where Assessed-Grooming: Sitting at sink Upper Body Bathing: Minimal assistance Where Assessed-Upper Body Bathing: Shower Lower Body Bathing: Supervision/safety Where Assessed-Lower Body Bathing: Shower Upper Body Dressing: Supervision/safety Where Assessed-Upper Body Dressing: Chair Lower Body Dressing: Minimal assistance Where Assessed-Lower Body Dressing: Chair Toileting: Supervision/safety Where Assessed-Toileting: Glass blower/designer: Therapist, music Method: Magazine features editor: Curator Method: Heritage manager: Shower seat with back      Therapy/Group: Individual Therapy  Magali Bray A Shivaay [place] 05/13/2020, 12:51 PM

## 2020-05-13 NOTE — Progress Notes (Signed)
[place] PHYSICAL MEDICINE & REHABILITATION PROGRESS NOTE  Subjective/Complaints: S  Pt reports needs oragel- ordered for pt prn.  Still having pain in neck- since surgery and R leg-  LBm last night- hasn't eaten breakfast and is nauseated- wants pain mes- suggests either antinausea medicine or eating toast/banana.   Very discouraged about LUE per PT- not sure what return and when it will occur- they are doing estim- wasn't clear on results of estim.      ROS:  Pt denies SOB, abd pain, CP, N/V/C/D, and vision changes    Objective: Vital Signs: Blood pressure (!) 146/72, pulse (!) 58, temperature 98 F (36.7 C), resp. rate 16, height 5\' 11"  (1.803 m), weight 86.3 kg, SpO2 98 %. No results found. No results for input(s): WBC, HGB, HCT, PLT in the last 72 hours. No results for input(s): NA, K, CL, CO2, GLUCOSE, BUN, CREATININE, CALCIUM in the last 72 hours.  Physical Exam: BP (!) 146/72 (BP Location: Right Arm)   Pulse (!) 58   Temp 98 F (36.7 C)   Resp 16   Ht 5\' 11"  (1.803 m)   Wt 86.3 kg   SpO2 98%   BMI 26.54 kg/m  General: sitting up in bed- appropriate, c/o LLE pain, NT in room, NAD HEENT: conjugate gaze Heart: borderline bradycardia- regular rhythm Chest: CTA B/L- no W/R/R- good air movement Abdomen: Soft, NT, ND, (+)BS  Extremities: No clubbing, cyanosis, or edema. Pulses are 2+ Skin: cervical wound CDI- no drainage through dressing Motor: RUE/RLE: 5/5 proximal distal RLE: 4+/5 proximal distally LUE: Shoulder abduction 2-/5, EF: 2/5, elbow extension 3+/5, handgrip 4+/5 Ambulating with RW with PT. Decreased sensation left hand and in variable pattern both legs Psych: appropriate  Assessment/Plan: 1. Functional deficits secondary to C5 nerve root palsy status post C3-C6 posterior fusion which require 3+ hours per day of interdisciplinary therapy in a comprehensive inpatient rehab setting.  Physiatrist is providing close team supervision and 24 hour  management of active medical problems listed below.  Physiatrist and rehab team continue to assess barriers to discharge/monitor patient progress toward functional and medical goals  Care Tool:  Bathing    Body parts bathed by patient: Left arm, Chest, Abdomen, Front perineal area, Right upper leg, Left upper leg, Face, Buttocks, Right lower leg, Left lower leg   Body parts bathed by helper: Right arm     Bathing assist Assist Level: Minimal Assistance - Patient > 75%     Upper Body Dressing/Undressing Upper body dressing   What is the patient wearing?: Pull over shirt    Upper body assist Assist Level: Supervision/Verbal cueing    Lower Body Dressing/Undressing Lower body dressing      What is the patient wearing?: Pants     Lower body assist Assist for lower body dressing: Supervision/Verbal cueing     Toileting Toileting    Toileting assist Assist for toileting: Contact Guard/Touching assist     Transfers Chair/bed transfer  Transfers assist     Chair/bed transfer assist level: Supervision/Verbal cueing Chair/bed transfer assistive device: Programmer, multimedia   Ambulation assist      Assist level: Supervision/Verbal cueing Assistive device: Walker-rolling Max distance: 300   Walk 10 feet activity   Assist     Assist level: Supervision/Verbal cueing Assistive device: Walker-rolling   Walk 50 feet activity   Assist    Assist level: Supervision/Verbal cueing Assistive device: Walker-rolling    Walk 150 feet activity   Assist  Assist level: Supervision/Verbal cueing Assistive device: Walker-rolling    Walk 10 feet on uneven surface  activity   Assist     Assist level: Contact Guard/Touching assist Assistive device: Aeronautical engineer Will patient use wheelchair at discharge?: No             Wheelchair 50 feet with 2 turns activity    Assist            Wheelchair 150  feet activity     Assist            Medical Problem List and Plan: 1.  Impaired function/C5 nerve root dyspraxia/palsy s/p C3-C6 posterior fusion with LUE weakness/N/T and impaired balance, gait and LUE movement  Continue CIR  Decrease dexamethasone to 1mg  BID 2.  Antithrombotics: -DVT/anticoagulation:  Mechanical: Sequential compression devices, below knee Bilateral lower extremities             -antiplatelet therapy: Aspirin restarted 3. Pain Management:  Changed Oxy to Norco and robaxin as needed. Requiring Norco. Helping with his neck and back pain.    -discussed appropriate positioning of LUE  9/4- reported pain overall stable/controlled with meds- con't regimen  Monitor with increased exertion 4. Mood: LCSW to follow for evaluation and support.              -antipsychotic agents: N/A 5. Neuropsych: This patient is capable of making decisions on his own behalf. 6. Skin/Wound Care: Routine pressure relief measures. D/c IV 7. Fluids/Electrolytes/Nutrition: Monitor I/Os.  8. HTN: Monitor BP --continue coreg and Lisinopril daily.  Moderate increased mobility  8/28: Elevated. Increase Flomax to 0.8mg .   8/31: elevated: add banana with breakfast  9/3: still elevated. May improve as we taper the steroids.   9/4- BP a little better 146/70s 9. BPH: On Flomax daily.   Monitor for retention 10.  AKI on CKD:   Cr. 2.06 on 8/27, severely elevated BUN   Labs ordered for Monday, echo reviewed from 2007- normal EF  NaCL fluid bolus ordered  Encourage fluids  8/30. Labs showing improvement today.    -daily bmet   -consider d/c of lisinopril if not continued improvement  9/1: Cr 1.75: encouraged hydration  9/4- labs Monday 11. Steroid induced hyperglycemia on T2DM with hyperglycemia: Diet controlled. Monitor with ac/hs and use SSI for elevated BS especially as on steroids.   8/29: elevated. Dietary consult ordered  9/3: better controlled. Continue steroid taper  9/4- BGs 124 to  max of 202- better- con't reigmen 12. Drug induced constipation: Continue senna S.  Added miralax also and order enema tonight.   Bowel meds increased again on 8/27  8/31: start Miralax  9/1: Had BM after mag citrate 13. Hyponatremia  Na 132 on 8/27--> 131 8/30 14.  Hypoalbuminemia  Supplement initiated on 8/27 15.  Transaminitis  AST elevated on 8/27-->stable to improved 8/30 13.  Leukocytosis-likely secondary to steroids  WBC 13.3 on 8/27--> 10.2 8/30  Afebrile  Continue to monitor 14. ?Acute on Anemia of chronic disease   Hb 11.9 on 8/27-->stable at 11.2 8/30    15.  Wheezing-patient states baseline, states he did not take any medications at home for this  Chest x-ray stable  As needed albuterol  16. Insomnia: start melatonin HS 17. Mouth pain  9/4- ordered oragel prn  LOS: 9 days A FACE TO FACE EVALUATION WAS PERFORMED  Holleigh [place] 05/13/2020, 2:15 PM

## 2020-05-13 NOTE — Plan of Care (Signed)
  Problem: Consults Goal: RH GENERAL PATIENT EDUCATION Description: See Patient Education module for education specifics. Outcome: Progressing Goal: Skin Care Protocol Initiated - if Braden Score 18 or less Description: If consults are not indicated, leave blank or document N/A Outcome: Progressing Goal: Diabetes Guidelines if Diabetic/Glucose > 140 Description: If diabetic or lab glucose is > 140 mg/dl - Initiate Diabetes/Hyperglycemia Guidelines & Document Interventions  Outcome: Progressing   Problem: RH BOWEL ELIMINATION Goal: RH STG MANAGE BOWEL WITH ASSISTANCE Description: STG Manage Bowel with mod I Assistance. Outcome: Progressing Goal: RH STG MANAGE BOWEL W/MEDICATION W/ASSISTANCE Description: STG Manage Bowel with Medication with mod I Assistance. Outcome: Progressing   Problem: RH BLADDER ELIMINATION Goal: RH STG MANAGE BLADDER WITH ASSISTANCE Description: STG Manage Bladder With mod I Assistance Outcome: Progressing Goal: RH STG MANAGE BLADDER WITH MEDICATION WITH ASSISTANCE Description: STG Manage Bladder With Medication With mod I Assistance. Outcome: Progressing   Problem: RH SKIN INTEGRITY Goal: RH STG SKIN FREE OF INFECTION/BREAKDOWN Description: Patient will have no signs of infection while on CIR Outcome: Progressing Goal: RH STG MAINTAIN SKIN INTEGRITY WITH ASSISTANCE Description: STG Maintain Skin Integrity With mod I Assistance. Outcome: Progressing Goal: RH STG ABLE TO PERFORM INCISION/WOUND CARE W/ASSISTANCE Description: STG Able To Perform Incision/Wound Care With mod I Assistance. Outcome: Progressing   Problem: RH SAFETY Goal: RH STG ADHERE TO SAFETY PRECAUTIONS W/ASSISTANCE/DEVICE Description: STG Adhere to Safety Precautions With mod I Assistance/Device. Outcome: Progressing Goal: RH STG DECREASED RISK OF FALL WITH ASSISTANCE Description: STG Decreased Risk of Fall With mod I Assistance. Outcome: Progressing   Problem: RH PAIN  MANAGEMENT Goal: RH STG PAIN MANAGED AT OR BELOW PT'S PAIN GOAL Description: Pain scale <4/10 Outcome: Progressing   Problem: RH KNOWLEDGE DEFICIT GENERAL Goal: RH STG INCREASE KNOWLEDGE OF SELF CARE AFTER HOSPITALIZATION Outcome: Progressing

## 2020-05-14 LAB — GLUCOSE, CAPILLARY
Glucose-Capillary: 117 mg/dL — ABNORMAL HIGH (ref 70–99)
Glucose-Capillary: 149 mg/dL — ABNORMAL HIGH (ref 70–99)
Glucose-Capillary: 142 mg/dL — ABNORMAL HIGH (ref 70–99)
Glucose-Capillary: 174 mg/dL — ABNORMAL HIGH (ref 70–99)

## 2020-05-14 NOTE — Plan of Care (Signed)
  Problem: Consults Goal: RH GENERAL PATIENT EDUCATION Description: See Patient Education module for education specifics. Outcome: Progressing Goal: Skin Care Protocol Initiated - if Braden Score 18 or less Description: If consults are not indicated, leave blank or document N/A Outcome: Progressing Goal: Diabetes Guidelines if Diabetic/Glucose > 140 Description: If diabetic or lab glucose is > 140 mg/dl - Initiate Diabetes/Hyperglycemia Guidelines & Document Interventions  Outcome: Progressing   Problem: RH BOWEL ELIMINATION Goal: RH STG MANAGE BOWEL WITH ASSISTANCE Description: STG Manage Bowel with mod I Assistance. Outcome: Progressing Goal: RH STG MANAGE BOWEL W/MEDICATION W/ASSISTANCE Description: STG Manage Bowel with Medication with mod I Assistance. Outcome: Progressing   Problem: RH BLADDER ELIMINATION Goal: RH STG MANAGE BLADDER WITH ASSISTANCE Description: STG Manage Bladder With mod I Assistance Outcome: Progressing Goal: RH STG MANAGE BLADDER WITH MEDICATION WITH ASSISTANCE Description: STG Manage Bladder With Medication With mod I Assistance. Outcome: Progressing   Problem: RH SKIN INTEGRITY Goal: RH STG SKIN FREE OF INFECTION/BREAKDOWN Description: Patient will have no signs of infection while on CIR Outcome: Progressing Goal: RH STG MAINTAIN SKIN INTEGRITY WITH ASSISTANCE Description: STG Maintain Skin Integrity With mod I Assistance. Outcome: Progressing Goal: RH STG ABLE TO PERFORM INCISION/WOUND CARE W/ASSISTANCE Description: STG Able To Perform Incision/Wound Care With mod I Assistance. Outcome: Progressing   Problem: RH SAFETY Goal: RH STG ADHERE TO SAFETY PRECAUTIONS W/ASSISTANCE/DEVICE Description: STG Adhere to Safety Precautions With mod I Assistance/Device. Outcome: Progressing Goal: RH STG DECREASED RISK OF FALL WITH ASSISTANCE Description: STG Decreased Risk of Fall With mod I Assistance. Outcome: Progressing   Problem: RH PAIN  MANAGEMENT Goal: RH STG PAIN MANAGED AT OR BELOW PT'S PAIN GOAL Description: Pain scale <4/10 Outcome: Progressing   Problem: RH KNOWLEDGE DEFICIT GENERAL Goal: RH STG INCREASE KNOWLEDGE OF SELF CARE AFTER HOSPITALIZATION Outcome: Progressing   Problem: Consults Goal: RH SPINAL CORD INJURY PATIENT EDUCATION Description:  See Patient Education module for education specifics.  Outcome: Progressing Goal: Skin Care Protocol Initiated - if Braden Score 18 or less Description: If consults are not indicated, leave blank or document N/A Outcome: Progressing Goal: Nutrition Consult-if indicated Outcome: Progressing Goal: Diabetes Guidelines if Diabetic/Glucose > 140 Description: If diabetic or lab glucose is > 140 mg/dl - Initiate Diabetes/Hyperglycemia Guidelines & Document Interventions  Outcome: Progressing   Problem: SCI BOWEL ELIMINATION Goal: RH STG MANAGE BOWEL WITH ASSISTANCE Description: STG Manage Bowel with Assistance. Outcome: Progressing   Problem: SCI BLADDER ELIMINATION Goal: RH STG MANAGE BLADDER WITH ASSISTANCE Description: STG Manage Bladder With mod I Assistance Outcome: Progressing Goal: RH STG MANAGE BLADDER WITH MEDICATION WITH ASSISTANCE Description: STG Manage Bladder With Medication With Assistance. Outcome: Progressing   Problem: RH SKIN INTEGRITY Goal: RH STG SKIN FREE OF INFECTION/BREAKDOWN Outcome: Progressing Goal: RH STG MAINTAIN SKIN INTEGRITY WITH ASSISTANCE Description: STG Maintain Skin Integrity With Assistance. Outcome: Progressing Goal: RH STG ABLE TO PERFORM INCISION/WOUND CARE W/ASSISTANCE Description: STG Able To Perform Incision/Wound Care With Assistance. Outcome: Progressing   Problem: RH SAFETY Goal: RH STG ADHERE TO SAFETY PRECAUTIONS W/ASSISTANCE/DEVICE Description: STG Adhere to Safety Precautions With Assistance/Device. Outcome: Progressing   Problem: RH PAIN MANAGEMENT Goal: RH STG PAIN MANAGED AT OR BELOW PT'S PAIN  GOAL Outcome: Progressing   Problem: RH KNOWLEDGE DEFICIT SCI Goal: RH STG INCREASE KNOWLEDGE OF SELF CARE AFTER SCI Outcome: Progressing

## 2020-05-14 NOTE — Progress Notes (Signed)
[name] PHYSICAL MEDICINE & REHABILITATION PROGRESS NOTE  Subjective/Complaints:   Pt reports didn't use oragel yet- when asked why- said "couldn't reach it"- nurse said she will administer.   No other specific complaints today.     ROS:   Pt denies SOB, abd pain, CP, N/V/C/D, and vision changes   Objective: Vital Signs: Blood pressure 103/60, pulse (!) 58, temperature 97.6 F (36.4 C), resp. rate 18, height 5\' 11"  (1.803 m), weight 86.3 kg, SpO2 98 %. No results found. No results for input(s): WBC, HGB, HCT, PLT in the last 72 hours. No results for input(s): NA, K, CL, CO2, GLUCOSE, BUN, CREATININE, CALCIUM in the last 72 hours.  Physical Exam: BP 103/60 (BP Location: Right Arm)   Pulse (!) 58   Temp 97.6 F (36.4 C)   Resp 18   Ht 5\' 11"  (1.803 m)   Wt 86.3 kg   SpO2 98%   BMI 26.54 kg/m  General: sitting up in bed- appropriate, c/o mouth pain, NAD HEENT: conjugate gaze Heart: mild bradycardia- regular rhythm, Chest: CTA B/L- no W/R/R- good air movement Abdomen: Soft, NT, ND, (+)BS  Extremities: No clubbing, cyanosis, or edema. Pulses are 2+ Skin: cervical wound CDI- no drainage through dressing Motor: RUE/RLE: 5/5 proximal distal RLE: 4+/5 proximal distally LUE: Shoulder abduction 2-/5, EF: 2/5, elbow extension 3+/5, handgrip 4+/5 Ambulating with RW with PT. Decreased sensation left hand and in variable pattern both legs Psych: appropriate  Assessment/Plan: 1. Functional deficits secondary to C5 nerve root palsy status post C3-C6 posterior fusion which require 3+ hours per day of interdisciplinary therapy in a comprehensive inpatient rehab setting.  Physiatrist is providing close team supervision and 24 hour management of active medical problems listed below.  Physiatrist and rehab team continue to assess barriers to discharge/monitor patient progress toward functional and medical goals  Care Tool:  Bathing    Body parts bathed by patient: Left arm,  Chest, Abdomen, Front perineal area, Right upper leg, Left upper leg, Face, Buttocks, Right lower leg, Left lower leg   Body parts bathed by helper: Right arm     Bathing assist Assist Level: Minimal Assistance - Patient > 75%     Upper Body Dressing/Undressing Upper body dressing   What is the patient wearing?: Pull over shirt    Upper body assist Assist Level: Supervision/Verbal cueing    Lower Body Dressing/Undressing Lower body dressing      What is the patient wearing?: Pants     Lower body assist Assist for lower body dressing: Supervision/Verbal cueing     Toileting Toileting    Toileting assist Assist for toileting: Contact Guard/Touching assist     Transfers Chair/bed transfer  Transfers assist     Chair/bed transfer assist level: Supervision/Verbal cueing Chair/bed transfer assistive device: Programmer, multimedia   Ambulation assist      Assist level: Supervision/Verbal cueing Assistive device: Walker-rolling Max distance: 131ft   Walk 10 feet activity   Assist     Assist level: Supervision/Verbal cueing Assistive device: Walker-rolling   Walk 50 feet activity   Assist    Assist level: Supervision/Verbal cueing Assistive device: Walker-rolling    Walk 150 feet activity   Assist    Assist level: Supervision/Verbal cueing Assistive device: Walker-rolling    Walk 10 feet on uneven surface  activity   Assist     Assist level: Contact Guard/Touching assist Assistive device: Aeronautical engineer Will patient use wheelchair  at discharge?: No             Wheelchair 50 feet with 2 turns activity    Assist            Wheelchair 150 feet activity     Assist            Medical Problem List and Plan: 1.  Impaired function/C5 nerve root dyspraxia/palsy s/p C3-C6 posterior fusion with LUE weakness/N/T and impaired balance, gait and LUE movement  Continue CIR  Decrease  dexamethasone to 1mg  BID 2.  Antithrombotics: -DVT/anticoagulation:  Mechanical: Sequential compression devices, below knee Bilateral lower extremities             -antiplatelet therapy: Aspirin restarted 3. Pain Management:  Changed Oxy to Norco and robaxin as needed. Requiring Norco. Helping with his neck and back pain.    -discussed appropriate positioning of LUE  9/5- pain is "controlled" per pt- con't regimen  Monitor with increased exertion 4. Mood: LCSW to follow for evaluation and support.              -antipsychotic agents: N/A 5. Neuropsych: This patient is capable of making decisions on his own behalf. 6. Skin/Wound Care: Routine pressure relief measures. D/c IV 7. Fluids/Electrolytes/Nutrition: Monitor I/Os.  8. HTN: Monitor BP --continue coreg and Lisinopril daily.  Moderate increased mobility  8/28: Elevated. Increase Flomax to 0.8mg .   8/31: elevated: add banana with breakfast  9/3: still elevated. May improve as we taper the steroids.   9/4- BP a little better 146/70s  9/5- BP 103/60 this AM- steroids tapering- could be why- will monitor 9. BPH: On Flomax daily.   Monitor for retention 10.  AKI on CKD:   Cr. 2.06 on 8/27, severely elevated BUN   Labs ordered for Monday, echo reviewed from 2007- normal EF  NaCL fluid bolus ordered  Encourage fluids  8/30. Labs showing improvement today.    -daily bmet   -consider d/c of lisinopril if not continued improvement  9/1: Cr 1.75: encouraged hydration  9/4- labs Monday 11. Steroid induced hyperglycemia on T2DM with hyperglycemia: Diet controlled. Monitor with ac/hs and use SSI for elevated BS especially as on steroids.   8/29: elevated. Dietary consult ordered  9/3: better controlled. Continue steroid taper  9/4- BGs 124 to max of 202- better- con't reigmen  9/5- BGs 117-202;  >149 only once- con't regimen 12. Drug induced constipation: Continue senna S.  Added miralax also and order enema tonight.   Bowel meds increased  again on 8/27  8/31: start Miralax  9/1: Had BM after mag citrate 13. Hyponatremia  Na 132 on 8/27--> 131 8/30 14.  Hypoalbuminemia  Supplement initiated on 8/27 15.  Transaminitis  AST elevated on 8/27-->stable to improved 8/30 13.  Leukocytosis-likely secondary to steroids  WBC 13.3 on 8/27--> 10.2 8/30  Afebrile  Continue to monitor 14. ?Acute on Anemia of chronic disease   Hb 11.9 on 8/27-->stable at 11.2 8/30    15.  Wheezing-patient states baseline, states he did not take any medications at home for this  Chest x-ray stable  As needed albuterol  16. Insomnia: start melatonin HS 17. Mouth pain  9/4- ordered oragel prn  9/5- reminded nurse to use/give  LOS: 10 days A FACE TO FACE EVALUATION WAS PERFORMED  Aeon Koors 05/14/2020, 1:32 PM

## 2020-05-15 ENCOUNTER — Inpatient Hospital Stay (HOSPITAL_COMMUNITY): Payer: Medicare Other | Admitting: Occupational Therapy

## 2020-05-15 ENCOUNTER — Inpatient Hospital Stay (HOSPITAL_COMMUNITY): Payer: Medicare Other

## 2020-05-15 LAB — GLUCOSE, CAPILLARY
Glucose-Capillary: 139 mg/dL — ABNORMAL HIGH (ref 70–99)
Glucose-Capillary: 115 mg/dL — ABNORMAL HIGH (ref 70–99)
Glucose-Capillary: 154 mg/dL — ABNORMAL HIGH (ref 70–99)
Glucose-Capillary: 132 mg/dL — ABNORMAL HIGH (ref 70–99)

## 2020-05-15 LAB — CBC
HCT: 34.4 % — ABNORMAL LOW (ref 39.0–52.0)
Hemoglobin: 11.5 g/dL — ABNORMAL LOW (ref 13.0–17.0)
MCH: 32.2 pg (ref 26.0–34.0)
MCHC: 33.4 g/dL (ref 30.0–36.0)
MCV: 96.4 fL (ref 80.0–100.0)
Platelets: 153 10*3/uL (ref 150–400)
RBC: 3.57 MIL/uL — ABNORMAL LOW (ref 4.22–5.81)
RDW: 15.9 % — ABNORMAL HIGH (ref 11.5–15.5)
WBC: 12.9 10*3/uL — ABNORMAL HIGH (ref 4.0–10.5)
nRBC: 0 % (ref 0.0–0.2)

## 2020-05-15 MED ORDER — DEXAMETHASONE 0.5 MG PO TABS
0.5000 mg | ORAL_TABLET | Freq: Two times a day (BID) | ORAL | Status: DC
  Administered 2020-05-15 – 2020-05-17 (×4): 0.5 mg via ORAL
  Filled 2020-05-15 (×4): qty 1

## 2020-05-15 MED ORDER — TRAZODONE HCL 50 MG PO TABS
25.0000 mg | ORAL_TABLET | Freq: Every day | ORAL | Status: DC
  Administered 2020-05-15 – 2020-05-16 (×2): 25 mg via ORAL
  Filled 2020-05-15 (×2): qty 1

## 2020-05-15 NOTE — Progress Notes (Signed)
Occupational Therapy Session Note  Patient Details  Name: [name] [name] MRN: [id number] Date of Birth: October 03, 1928  Today's Date: 05/15/2020 OT Individual Time: 3009-2330 OT Individual Time Calculation (min): 62 min    Short Term Goals: Week 1:  OT Short Term Goal 1 (Week 1): STG = LTGs due to ELOS  Skilled Therapeutic Interventions/Progress Updates:    Pt supine in bed, c/o 5-6/10 pain in neck, worse standing than sitting, but reports "pain has not changed since surgery".  Pt completed all functional mobility throughout session with close supervision using RW and grab bars.  Pt able to complete supine<>sit without grab bars with independence.  Pt bathed at walk in shower using shower bench needing close supervision/SBA to retrieve towels and wash cloths as needed.  Pt bathed UB and LB using long handled sponge to prevent bending or twisting with close supervision.  Pt reports he has a walk in shower at home and shower chair in good shape. Pt completed all UB and LB dressing sitting EOB with increased time and modified independence. Pt reports he does not have grab bars in his walk in shower at home.  OT recommended to pt to have installed prior to showering at home to increase safety and independence. Pt reports he does not have enough room for a chair at sinkside, however standing at sink to shave is too painful.  Educated pt on completing shaving task at home at [facility] level using basin and table with mirror to prevent pain.  Pt completed shaving task at sink in sitting this session with setup.  Pt requesting to return to bed. Pt supine, call bell in reach, bed alarm on.  Educational handouts provided for pt and family regarding UB and LB dressing/bathing compensatory techniques, and type of bedrail purchase.    Therapy Documentation Precautions:  Precautions Precautions: Cervical, Fall Restrictions Weight Bearing Restrictions: No   Therapy/Group: Individual Therapy  [name] [name] 05/15/2020, 11:56 AM

## 2020-05-15 NOTE — Plan of Care (Signed)
  Problem: Consults Goal: RH GENERAL PATIENT EDUCATION Description: See Patient Education module for education specifics. Outcome: Progressing Goal: Skin Care Protocol Initiated - if Braden Score 18 or less Description: If consults are not indicated, leave blank or document N/A Outcome: Progressing Goal: Diabetes Guidelines if Diabetic/Glucose > 140 Description: If diabetic or lab glucose is > 140 mg/dl - Initiate Diabetes/Hyperglycemia Guidelines & Document Interventions  Outcome: Progressing   Problem: RH BOWEL ELIMINATION Goal: RH STG MANAGE BOWEL WITH ASSISTANCE Description: STG Manage Bowel with mod I Assistance. Outcome: Progressing Goal: RH STG MANAGE BOWEL W/MEDICATION W/ASSISTANCE Description: STG Manage Bowel with Medication with mod I Assistance. Outcome: Progressing   Problem: RH BLADDER ELIMINATION Goal: RH STG MANAGE BLADDER WITH ASSISTANCE Description: STG Manage Bladder With mod I Assistance Outcome: Progressing Goal: RH STG MANAGE BLADDER WITH MEDICATION WITH ASSISTANCE Description: STG Manage Bladder With Medication With mod I Assistance. Outcome: Progressing   Problem: RH SKIN INTEGRITY Goal: RH STG SKIN FREE OF INFECTION/BREAKDOWN Description: Patient will have no signs of infection while on CIR Outcome: Progressing Goal: RH STG MAINTAIN SKIN INTEGRITY WITH ASSISTANCE Description: STG Maintain Skin Integrity With mod I Assistance. Outcome: Progressing Goal: RH STG ABLE TO PERFORM INCISION/WOUND CARE W/ASSISTANCE Description: STG Able To Perform Incision/Wound Care With mod I Assistance. Outcome: Progressing   Problem: RH SAFETY Goal: RH STG ADHERE TO SAFETY PRECAUTIONS W/ASSISTANCE/DEVICE Description: STG Adhere to Safety Precautions With mod I Assistance/Device. Outcome: Progressing Goal: RH STG DECREASED RISK OF FALL WITH ASSISTANCE Description: STG Decreased Risk of Fall With mod I Assistance. Outcome: Progressing   Problem: RH PAIN  MANAGEMENT Goal: RH STG PAIN MANAGED AT OR BELOW PT'S PAIN GOAL Description: Pain scale <4/10 Outcome: Progressing   Problem: RH KNOWLEDGE DEFICIT GENERAL Goal: RH STG INCREASE KNOWLEDGE OF SELF CARE AFTER HOSPITALIZATION Outcome: Progressing   Problem: Consults Goal: RH SPINAL CORD INJURY PATIENT EDUCATION Description:  See Patient Education module for education specifics.  Outcome: Progressing Goal: Skin Care Protocol Initiated - if Braden Score 18 or less Description: If consults are not indicated, leave blank or document N/A Outcome: Progressing Goal: Nutrition Consult-if indicated Outcome: Progressing Goal: Diabetes Guidelines if Diabetic/Glucose > 140 Description: If diabetic or lab glucose is > 140 mg/dl - Initiate Diabetes/Hyperglycemia Guidelines & Document Interventions  Outcome: Progressing   Problem: SCI BOWEL ELIMINATION Goal: RH STG MANAGE BOWEL WITH ASSISTANCE Description: STG Manage Bowel with Assistance. Outcome: Progressing   Problem: SCI BLADDER ELIMINATION Goal: RH STG MANAGE BLADDER WITH ASSISTANCE Description: STG Manage Bladder With mod I Assistance Outcome: Progressing Goal: RH STG MANAGE BLADDER WITH MEDICATION WITH ASSISTANCE Description: STG Manage Bladder With Medication With Assistance. Outcome: Progressing   Problem: RH SKIN INTEGRITY Goal: RH STG SKIN FREE OF INFECTION/BREAKDOWN Outcome: Progressing Goal: RH STG MAINTAIN SKIN INTEGRITY WITH ASSISTANCE Description: STG Maintain Skin Integrity With Assistance. Outcome: Progressing Goal: RH STG ABLE TO PERFORM INCISION/WOUND CARE W/ASSISTANCE Description: STG Able To Perform Incision/Wound Care With Assistance. Outcome: Progressing   Problem: RH SAFETY Goal: RH STG ADHERE TO SAFETY PRECAUTIONS W/ASSISTANCE/DEVICE Description: STG Adhere to Safety Precautions With Assistance/Device. Outcome: Progressing   Problem: RH PAIN MANAGEMENT Goal: RH STG PAIN MANAGED AT OR BELOW PT'S PAIN  GOAL Outcome: Progressing   Problem: RH KNOWLEDGE DEFICIT SCI Goal: RH STG INCREASE KNOWLEDGE OF SELF CARE AFTER SCI Outcome: Progressing

## 2020-05-15 NOTE — Progress Notes (Signed)
Physical Therapy Session Note  Patient Details  Name: [name] [name] MRN: [id number] Date of Birth: May 27, 1929  Today's Date: 05/15/2020 PT Individual Time: 0900-1000 PT Individual Time Calculation (min): 60 min   Short Term Goals: Week 1:  PT Short Term Goal 1 (Week 1): STG= LTG due to ELOS  Skilled Therapeutic Interventions/Progress Updates:    Pt received supine in bed, agreeable to PT session. Pt reporting much frustration regarding LUE weakness and how he feels that limited significant improvement has been made. Lengthy discussion held with patient regarding importance of continued therapies to maximize opportunity at regaining strength and discussing with MDs regarding prognosis. Pt also reporting moderate general fatigue due to lack of sleep from the night and how he received a sleeping pill around ~0300 which likely was impacting his activity tolerance this morning session. MD notified afterwards of all his concerns. Pt performed supine<>sit with supervision with HOB flat via log rolling technique. While seated EOB, he performed LB and UB dressing, requiring minA for threading technique and CGA guard while pulling his pants/boxers up in standing. MaxA for donning shoes for time management purposes. Pt ambulated from his room to main therapy gym with close supervision and RW, x1 minor LOB to the R while trying to make room for others walking in the hallway, he was able to self correct without assist. Stand>sit with CGA with RW to mat table where he performed the following NMR to LUE propped on blue platform and therapist cueing for dosage, muscle activation, and mirror for visual feedback:  -1x10 AAROM LUE elbow flex/ext in antigravity position -1x10 AAROM LUE supination/pronation -1x10 AROM LUE wrist ext/flex -1x10 AAROM LUE ulnar/radial deviation -1x10 AAROM LUE shldr flex to 90 deg -1x10 AAROM LUE shldr abd to 80 deg -1x10 AAROM LUE shldr abd isometrics  Pt then negotiated up/down  x8 steps with CGA and U HR support, step-to pattern with L foot leading ascent, R foot leading descent. After seated rest break, pt ambulated back to his room with CGA and RW, sit>supine with supervision via log rolling. Pt ended session supine in bed, 3/4 bed rails up, needs in reach, bed alarm on. Pt resting.    Therapy Documentation Precautions:  Precautions Precautions: Cervical, Fall Restrictions Weight Bearing Restrictions: No Vital Signs: Therapy Vitals Temp: 97.6 F (36.4 C) Pulse Rate: (!) 58 Resp: 16 BP: (!) 102/46 Patient Position (if appropriate): Lying Oxygen Therapy SpO2: 98 % O2 Device: Room Air  Therapy/Group: Individual Therapy  Ciana Simmon P Kandy Towery PT 05/15/2020, 9:44 AM

## 2020-05-15 NOTE — Progress Notes (Signed)
[facility] PHYSICAL MEDICINE & REHABILITATION PROGRESS NOTE  Subjective/Complaints: Slept poorly last night- I will schedule trazodone. BP and pulse soft this morning No complaints. Worked well with PT besides grogginess after not sleeping well at night   ROS:   Pt denies SOB, abd pain, CP, N/V/C/D, and vision changes   Objective: Vital Signs: Blood pressure (!) 102/46, pulse (!) 58, temperature 97.6 F (36.4 C), resp. rate 16, height 5\' 11"  (1.803 m), weight 86.3 kg, SpO2 98 %. No results found. Recent Labs    05/15/20 0553  WBC 12.9*  HGB 11.5*  HCT 34.4*  PLT 153   No results for input(s): NA, K, CL, CO2, GLUCOSE, BUN, CREATININE, CALCIUM in the last 72 hours.  Physical Exam: BP (!) 102/46 (BP Location: Right Arm)   Pulse (!) 58   Temp 97.6 F (36.4 C)   Resp 16   Ht 5\' 11"  (1.803 m)   Wt 86.3 kg   SpO2 98%   BMI 26.54 kg/m   General: Alert and oriented x 3, No apparent distress HEENT: Head is normocephalic, no teeth Neck: Supple without JVD or lymphadenopathy Heart: Bradycardic. No murmurs rubs or gallops Chest: CTA bilaterally without wheezes, rales, or rhonchi; no distress Abdomen: Soft, non-tender, non-distended, bowel sounds positive. Extremities: No clubbing, cyanosis, or edema. Pulses are 2+ Skin: cervical wound CDI- no drainage through dressing Motor: RUE/RLE: 5/5 proximal distal RLE: 4+/5 proximal distally LUE: Shoulder abduction 2-/5, EF: 2/5, elbow extension 3+/5, handgrip 4+/5 Ambulating with RW with PT. Decreased sensation left hand and in variable pattern both legs Psych: appropriate    Assessment/Plan: 1. Functional deficits secondary to C5 nerve root palsy status post C3-C6 posterior fusion which require 3+ hours per day of interdisciplinary therapy in a comprehensive inpatient rehab setting.  Physiatrist is providing close team supervision and 24 hour management of active medical problems listed below.  Physiatrist and rehab team  continue to assess barriers to discharge/monitor patient progress toward functional and medical goals  Care Tool:  Bathing    Body parts bathed by patient: Left arm, Chest, Abdomen, Front perineal area, Right upper leg, Left upper leg, Face, Buttocks, Right lower leg, Left lower leg, Right arm   Body parts bathed by helper: Right arm     Bathing assist Assist Level: Supervision/Verbal cueing (using long handled sponge)     Upper Body Dressing/Undressing Upper body dressing   What is the patient wearing?: Pull over shirt    Upper body assist Assist Level: Independent    Lower Body Dressing/Undressing Lower body dressing      What is the patient wearing?: Pants, Underwear/pull up     Lower body assist Assist for lower body dressing: Supervision/Verbal cueing     Toileting Toileting    Toileting assist Assist for toileting: Contact Guard/Touching assist     Transfers Chair/bed transfer  Transfers assist     Chair/bed transfer assist level: Supervision/Verbal cueing Chair/bed transfer assistive device: Programmer, multimedia   Ambulation assist      Assist level: Supervision/Verbal cueing Assistive device: Walker-rolling Max distance: 168ft   Walk 10 feet activity   Assist     Assist level: Supervision/Verbal cueing Assistive device: Walker-rolling   Walk 50 feet activity   Assist    Assist level: Supervision/Verbal cueing Assistive device: Walker-rolling    Walk 150 feet activity   Assist    Assist level: Supervision/Verbal cueing Assistive device: Walker-rolling    Walk 10 feet on uneven surface  activity   Assist     Assist level: Contact Guard/Touching assist Assistive device: Aeronautical engineer Will patient use wheelchair at discharge?: No             Wheelchair 50 feet with 2 turns activity    Assist            Wheelchair 150 feet activity     Assist             Medical Problem List and Plan: 1.  Impaired function/C5 nerve root dyspraxia/palsy s/p C3-C6 posterior fusion with LUE weakness/N/T and impaired balance, gait and LUE movement  Continue CIR  Decreased dexamethasone to 0.5mg  BID on 9/6.  2.  Antithrombotics: -DVT/anticoagulation:  Mechanical: Sequential compression devices, below knee Bilateral lower extremities             -antiplatelet therapy: Aspirin restarted 3. Pain Management:  Changed Oxy to Norco and robaxin as needed. Requiring Norco. Helping with his neck and back pain.    -discussed appropriate positioning of LUE  9/6: Pain is more severe today. He does not want constipating medications. Advised that Norco is constipating. Prefers to assess how he does one more day prior to making any changes.   Monitor with increased exertion 4. Mood: LCSW to follow for evaluation and support.              -antipsychotic agents: N/A 5. Neuropsych: This patient is capable of making decisions on his own behalf. 6. Skin/Wound Care: Routine pressure relief measures. D/c IV 7. Fluids/Electrolytes/Nutrition: Monitor I/Os.  8. HTN: Monitor BP --continue coreg and Lisinopril daily. Also on Flomax 0.8mg   9/6: soft to normal on last 5 reads. Continue to monitor.  9. BPH: On Flomax daily.   Monitor for retention 10.  AKI on CKD:   Cr. 2.06 on 8/27, severely elevated BUN   Labs ordered for Monday, echo reviewed from 2007- normal EF  NaCL fluid bolus ordered  Encourage fluids  8/30. Labs showing improvement today.    -daily bmet   -consider d/c of lisinopril if not continued improvement  9/1: Cr 1.75: encouraged hydration 11. Steroid induced hyperglycemia on T2DM with hyperglycemia: Diet controlled. Monitor with ac/hs and use SSI for elevated BS especially as on steroids.   8/29: elevated. Dietary consult ordered  9/3: better controlled. Continue steroid taper  9/4- BGs 124 to max of 202- better- con't reigmen  9/5- BGs 117-202;  >149 only  once- con't regimen 12. Drug induced constipation: Continue senna S.  Added miralax also and order enema tonight.   Bowel meds increased again on 8/27  8/31: start Miralax  9/1: Had BM after mag citrate 13. Hyponatremia  Na 132 on 8/27--> 131 8/30, 133 on 8/31 14.  Hypoalbuminemia  Supplement initiated on 8/27 15.  Transaminitis  AST elevated on 8/27-->stable to improved 8/30 13.  Leukocytosis-12.9 on 9/6- stable from 8/27  Continue to monitor 14. ?Acute on Anemia of chronic disease   Hb 11.9 on 8/27-->stable at 11.2 8/30, stable 9/6    15.  Wheezing-patient states baseline, states he did not take any medications at home for this  Chest x-ray stable  As needed albuterol  16. Insomnia: start melatonin HS 17. Mouth pain  9/4- ordered oragel prn  9/5- reminded nurse to use/give  LOS: 11 days A FACE TO FACE EVALUATION WAS PERFORMED  Martha Clan P Avynn Klassen 05/15/2020, 12:16 PM

## 2020-05-15 NOTE — Progress Notes (Signed)
Physical Therapy Session Note  Patient Details  Name: [name] [name] MRN: [id number] Date of Birth: 1929-06-13  Today's Date: 05/15/2020 PT Individual Time: 1300-1415 PT Individual Time Calculation (min): 75 min   Short Term Goals: Week 1:  PT Short Term Goal 1 (Week 1): STG= LTG due to ELOS Week 2:    Week 3:     Skilled Therapeutic Interventions/Progress Updates:    PAIN cervical, 5/10, see below  Pt initially supine and agreeable to treatment.  C/o L cervical pain, nursing notified and provided pain meds.  Rest given as needed.   Pt w/concerns regarding LUE weakness, asks therapist "do you know any tricks to fix thsi?" agreed to incorporate strengthening into session.  Supine to sit w/supervision. In sitting performed AAROM shoulder/Elbow as able, unable to activate deltoid, supraspinatus, and very limited biceps.   SPT to wc w/rw and supervision.  Transported to gym for continued session.   STS from wc and performed LUE AAROM therex using elevated bedside table as powderboard for elbow flex/ext and shoulder IR/ER w/poor activation of ER and biceps.  Discussed possible need for supportive sling to prevent subluxation and future shoulder pain w/primary therapist.  Pt c/o urgent need for BM.   Gait >182ft to bathroom w/RW and close supervision, pt slightly incontinent of bowels before reaching commode.  Commode transfer to low height w/cga.  Pt able to complete BM in continent manner.   STS from low commode w/cga to min assist.  Stands w/walker while therapist performs perineal hygiene in standing. Pt returns to sitting for changing to clean boxer shorts and pants which therapist retrieved from room.Marland Kitchen  STS w/min assist from low commode.  Gait 52ft to mat w/close supervision.  Pt rested in sitting x 3 min.  Gait 149ft to main gym w/RW and close supervision.  Balance:  Standing w/use of RW for support performed: Tapping 5 in cones placed in semicircular and in tandem patterns  performed w/each LE w/rw and cga w/all.  Rests in sitting between efforts.   Standing on foam eyes open no UE support w/min assist due to increased sway, further increase in sway w/slow cervical rotation.    Gait >165ft to room and turn/sit to edge of bed w/close supervision.  Sit to supine w/supervision using bedrail. Pt left supine w/rails up x 4, alarm set, bed in lowest position, and needs in reach.  Therapy Documentation Precautions:  Precautions Precautions: Cervical, Fall Restrictions Weight Bearing Restrictions: No   Therapy/Group: Individual Therapy  Callie Fielding, [place] 05/15/2020, 4:11 PM

## 2020-05-16 ENCOUNTER — Inpatient Hospital Stay (HOSPITAL_COMMUNITY): Payer: Medicare Other | Admitting: Occupational Therapy

## 2020-05-16 ENCOUNTER — Inpatient Hospital Stay (HOSPITAL_COMMUNITY): Payer: Medicare Other

## 2020-05-16 LAB — GLUCOSE, CAPILLARY
Glucose-Capillary: 215 mg/dL — ABNORMAL HIGH (ref 70–99)
Glucose-Capillary: 106 mg/dL — ABNORMAL HIGH (ref 70–99)
Glucose-Capillary: 139 mg/dL — ABNORMAL HIGH (ref 70–99)
Glucose-Capillary: 121 mg/dL — ABNORMAL HIGH (ref 70–99)
Glucose-Capillary: 110 mg/dL — ABNORMAL HIGH (ref 70–99)

## 2020-05-16 NOTE — Progress Notes (Signed)
Inpatient Rehabilitation Care Coordinator  Discharge Note  The overall goal for the admission was met for:   Discharge location: Yes-HOME WITH DAUGHTER AND SON [place] WHO ARE AVAILABLE IN THE EVENINGS  Length of Stay: Yes-13 DAYS  Discharge activity level: Yes-MOD/I-SUPERVISION LEVEL  Home/community participation: Yes  Services provided included: MD, RD, PT, OT, RN, CM, TR, Pharmacy and SW  Financial Services: Medicare and Private Insurance: [employer]  Follow-up services arranged: Home Health: [place] and Patient/Family request agency HH: PREF USING THEM, DME: NO NEEDS  ORDERED 3 IN 1 VIA ADAPT  Comments (or additional information):DAUGHTER AND SON [place] WERE HERE FOR EDUCATION AND IT WENT WELL. ALL PREPARED FOR DISCHARGE AND PLEASED WITH HIS PROGRESS MADE HERE. HAVE GIVEN DAUGHTER PRIVATE DUTY LIST TO FOLLOW UP WITH IF FEELS NEEDED AT HOME  Patient/Family verbalized understanding of follow-up arrangements: Yes  Individual responsible for coordination of the follow-up plan: SELF & [facility] [phone]  Confirmed correct DME delivered: Elease Hashimoto 05/16/2020    Zayquan Bogard, Gardiner Rhyme

## 2020-05-16 NOTE — Progress Notes (Signed)
[facility] PHYSICAL MEDICINE & REHABILITATION PROGRESS NOTE  Subjective/Complaints: Patient seen laying in bed this AM.  He states he slept well overnight.  He has questions regarding neck pain and recovery.   ROS: Denies CP, SOB, N/V/D  Objective: Vital Signs: Blood pressure 116/65, pulse 68, temperature 98.2 F (36.8 C), resp. rate 18, height 5\' 11"  (1.803 m), weight 79.2 kg, SpO2 95 %. No results found. Recent Labs    05/15/20 0553  WBC 12.9*  HGB 11.5*  HCT 34.4*  PLT 153   No results for input(s): NA, K, CL, CO2, GLUCOSE, BUN, CREATININE, CALCIUM in the last 72 hours.  Physical Exam: BP 116/65 (BP Location: Right Arm)   Pulse 68   Temp 98.2 F (36.8 C)   Resp 18   Ht 5\' 11"  (1.803 m)   Wt 79.2 kg   SpO2 95%   BMI 24.35 kg/m  Constitutional: No distress . Vital signs reviewed. HENT: Normocephalic.  Atraumatic. Eyes: EOMI. No discharge. Cardiovascular: No JVD. Respiratory: Normal effort.  No stridor. GI: Non-distended. Skin: Neck incision CDI. Psych: Normal mood.  Normal behavior. Musc: No edema in extremities.  No tenderness in extremities. Neuro: Alert Motor: RUE/RLE: 5/5 proximal distal, unchanged RLE: 4+/5 proximal distally, unchanged LUE: Shoulder abduction 2/5, EF 2/5, elbow extension 2+/5, handgrip 4-/5  Assessment/Plan: 1. Functional deficits secondary to C5 nerve root palsy status post C3-C6 posterior fusion which require 3+ hours per day of interdisciplinary therapy in a comprehensive inpatient rehab setting.  Physiatrist is providing close team supervision and 24 hour management of active medical problems listed below.  Physiatrist and rehab team continue to assess barriers to discharge/monitor patient progress toward functional and medical goals  Care Tool:  Bathing    Body parts bathed by patient: Left arm, Chest, Abdomen, Front perineal area, Right upper leg, Left upper leg, Face, Buttocks, Right lower leg, Left lower leg, Right arm   Body  parts bathed by helper: Right arm     Bathing assist Assist Level: Supervision/Verbal cueing (using long handled sponge)     Upper Body Dressing/Undressing Upper body dressing   What is the patient wearing?: Pull over shirt    Upper body assist Assist Level: Independent    Lower Body Dressing/Undressing Lower body dressing      What is the patient wearing?: Pants, Underwear/pull up     Lower body assist Assist for lower body dressing: Supervision/Verbal cueing     Toileting Toileting    Toileting assist Assist for toileting: Contact Guard/Touching assist     Transfers Chair/bed transfer  Transfers assist     Chair/bed transfer assist level: Supervision/Verbal cueing Chair/bed transfer assistive device: Programmer, multimedia   Ambulation assist      Assist level: Supervision/Verbal cueing Assistive device: Walker-rolling Max distance: 154ft   Walk 10 feet activity   Assist     Assist level: Supervision/Verbal cueing Assistive device: Walker-rolling   Walk 50 feet activity   Assist    Assist level: Supervision/Verbal cueing Assistive device: Walker-rolling    Walk 150 feet activity   Assist    Assist level: Supervision/Verbal cueing Assistive device: Walker-rolling    Walk 10 feet on uneven surface  activity   Assist     Assist level: Contact Guard/Touching assist Assistive device: Aeronautical engineer Will patient use wheelchair at discharge?: No             Wheelchair 50 feet with 2 turns activity  Assist            Wheelchair 150 feet activity     Assist            Medical Problem List and Plan: 1.  Impaired function/C5 nerve root dyspraxia/palsy s/p C3-C6 posterior fusion with LUE weakness/N/T and impaired balance, gait and LUE movement  Continue CIR  Decreased dexamethasone to 0.5mg  BID on 9/6.  2.  Antithrombotics: -DVT/anticoagulation:  Mechanical:  Sequential compression devices, below knee Bilateral lower extremities             -antiplatelet therapy: Aspirin restarted 3. Pain Management:  Changed Oxy to Norco and robaxin as needed. Requiring Norco. Helping with his neck and back pain.    -discussed appropriate positioning of LUE  Relatively controlled with meds on 9/7  Monitor with increased exertion 4. Mood: LCSW to follow for evaluation and support.              -antipsychotic agents: N/A 5. Neuropsych: This patient is capable of making decisions on his own behalf. 6. Skin/Wound Care: Routine pressure relief measures. D/c IV 7. Fluids/Electrolytes/Nutrition: Monitor I/Os.  8. HTN: Monitor BP --continue coreg and Lisinopril daily. Also on Flomax 0.8mg   Labile on 9/7, monitor for trend 9. BPH: On Flomax daily.   Monitor for retention 10.  AKI on CKD:   Cr.  1.75 on 8/31 11. Steroid induced hyperglycemia on T2DM with hyperglycemia: Diet controlled. Monitor with ac/hs and use SSI for elevated BS especially as on steroids.   Dietary consult ordered  Slightly elevated on 9/7 12. Drug induced constipation: Continue senna S.  Added miralax also and order enema tonight.   Bowel meds increased again on 8/31 13. Hyponatremia  Na 133 on 8/31 14.  Hypoalbuminemia  Supplement initiated on 8/27 15.  Transaminitis  AST elevated on 8/27-->stable to improved 8/30 13.  Leukocytosis-likely drug-induced  WBCs 12.9 on 9/6   Continue to monitor 14. ?Acute on Anemia of chronic disease   Hb 11.5 on 9/6 15.  Wheezing-patient states baseline, states he did not take any medications at home for this  Chest x-ray stable  As needed albuterol  16.  Sleep disturbance: started melatonin HS  Trazodone as needed  Improving 17. Mouth pain  Improved  LOS: 12 days A FACE TO FACE EVALUATION WAS PERFORMED  Leana Springston Lorie Phenix 05/16/2020, 11:19 AM

## 2020-05-16 NOTE — Discharge Summary (Signed)
Physical Therapy Discharge Summary  Patient Details  Name: [name] [name] MRN: [id number] Date of Birth: 25-Mar-1929  Today's Date: 05/16/2020 PT Individual Time: 0800-0900 PT Individual Time Calculation (min): 60 min   Patient has met 8 of 11 long term goals due to improved activity tolerance, improved balance, improved postural control, increased strength, decreased pain, ability to compensate for deficits, functional use of  right lower extremity and left lower extremity and improved coordination.  Patient to discharge at an ambulatory level Supervision.   Patient's care partner is independent to provide the necessary physical assistance at discharge.  Reasons goals not met: Pt has made great progress during his rehab stay, however he has required supervision for ambulation 2/2 intermittent VC for safety during functional mobility using RW.   Recommendation:  Patient will benefit from ongoing skilled PT services in home health setting to continue to advance safe functional mobility, address ongoing impairments in balance, gait, endurance and LUE weakness in order to minimize fall risk.  Equipment: RW  Reasons for discharge: treatment goals met  Patient/family agrees with progress made and goals achieved: Yes  PT Discharge Precautions/Restrictions Precautions Precautions: Cervical;Fall Restrictions Weight Bearing Restrictions: No Pain Pain Assessment Pain Scale: 0-10 Pain Score: 0-No pain Vision/Perception  Perception Perception: Within Functional Limits Praxis Praxis: Intact  Cognition Overall Cognitive Status: Within Functional Limits for tasks assessed Arousal/Alertness: Awake/alert Orientation Level: Oriented X4 Attention: Sustained;Selective;Divided Sustained Attention: Appears intact Selective Attention: Appears intact Divided Attention: Appears intact Memory: Appears intact Awareness: Appears intact Problem Solving: Appears intact Executive Function:  Sequencing Sequencing: Appears intact Safety/Judgment: Appears intact Sensation Sensation Light Touch: Appears Intact Hot/Cold: Appears Intact Proprioception: Appears Intact Coordination Gross Motor Movements are Fluid and Coordinated: No Fine Motor Movements are Fluid and Coordinated: No Coordination and Movement Description: B LE intact, L UE decreased d/t C5 nerve palsy Finger Nose Finger Test: intact on Rt, unable on Lt due to decreased bicep activation Motor  Motor Motor: Other (comment) Motor - Skilled Clinical Observations: LUE weakness, primarily biceps, shoulder and forearm  Mobility Bed Mobility Bed Mobility: Rolling Right;Rolling Left;Sit to Supine;Supine to Sit Rolling Right: Independent Rolling Left: Independent Right Sidelying to Sit: Independent Transfers Transfers: Sit to Stand;Stand Pivot Transfers Sit to Stand: Independent with assistive device Stand to Sit: Independent with assistive device Stand Pivot Transfers: Independent with assistive device Stand Pivot Transfer Details: Verbal cues for precautions/safety Transfer (Assistive device): Rolling walker Locomotion  Gait Ambulation: Yes Gait Assistance: Supervision/Verbal cueing Gait Distance (Feet): 300 Feet (ft) Assistive device: Rolling walker Gait Assistance Details: Verbal cues for precautions/safety;Verbal cues for technique;Verbal cues for safe use of DME/AE Gait Gait: Yes Gait Pattern: Impaired Gait Pattern: Decreased step length - left;Decreased step length - right;Decreased hip/knee flexion - left;Decreased hip/knee flexion - right Stairs / Additional Locomotion Stairs: Yes Stairs Assistance: Supervision/Verbal cueing Stair Management Technique: One rail Right Number of Stairs: 12 Height of Stairs: 6 (inches) Ramp: Supervision/Verbal cueing Curb: Supervision/Verbal cueing Wheelchair Mobility Wheelchair Mobility: No  Trunk/Postural Assessment  Cervical Assessment Cervical Assessment:  Exceptions to [facility] (slightly forward head posture, pain limiting asesment) Thoracic Assessment Thoracic Assessment: Exceptions to WFL (mild kyphossi) Lumbar Assessment Lumbar Assessment: Exceptions to [facility] (mild posterior pelvic tilt) Postural Control Postural Control: Within Functional Limits  Balance Balance Balance Assessed: Yes Dynamic Sitting Balance Sitting balance - Comments: Pt able to maintain sitting at edge of mat table indep without BUE support both statically and while performing simple functional tasks Static Standing Balance Static Standing - Balance Support:  During functional activity Static Standing - Level of Assistance: 6: Modified independent (Device/Increase time) (RW) Dynamic Standing Balance Dynamic Standing - Comments: Supervision for dynamic tasks while standing 2/2 spinal precautions and slight posterior bias Extremity Assessment  RUE Assessment RUE Assessment: Within Functional Limits General Strength Comments: grossly 5/5 LUE Assessment LUE Assessment: Exceptions to [facility] Passive Range of Motion (PROM) Comments: WNL General Strength Comments: biceps 2-/5, Triceps 4+/5, forearm 3-/5, wrist ext 3/5 RLE Assessment RLE Assessment: Exceptions to [facility] RLE Strength Right Hip Flexion: 4-/5 Right Knee Extension: 4/5 Right Ankle Dorsiflexion: 4/5 LLE Assessment LLE Assessment: Exceptions to [facility] LLE Strength Left Hip Flexion: 3+/5 Left Knee Extension: 4/5 Left Ankle Dorsiflexion: 4+/5   Skilled Intervention: Pt received supine in bed, awake and alert, agreeable to PT session. Pt reports mild cervical neck pain, endorses recent pain medication from nursing staff. HOB flattened, pt able to roll L<>R mod I without bed features, supine<>sit via log rolling mod I with HOB flat. Once EOB assisted with donning shoes with modA for time management purposes. Sit<>stand mod I from EOB to RW, ambulated from his room to main therapy gym (~154f) with supervision and RW. Once in  gym, he was able to navigate up/down x12 steps with close supervision and UHR support with RUE; cues provided for ascending with LLE and descending with RLE for step-to pattern. Pt reports moderate fatigue, thus seated rest break provided for recovery. From there, he ambulated ~1038fto small therapy gym where he performed car simulator transfer with supervision with RW, increased difficulty bringing BLE's into and out of car. Pt then ambulated up/down 1073famp with supervision and RW, increased difficulty with descent > ascent due to controlling speed for RW. Pt ambulated to main therapy gym where he performed standing UE reacher on slanted platform without UE support, therapist providing CGA for stability. With RUE Ranger, performed forward/backward reaching and abduction/adduction reaching. Pt with difficulty stabilizing direction and maintaining correct muscle activation for short durations. Pt then ambulated back to his room with supervision and RW, ended session seated in recliner with needs in reach.  Nicole Hafley P Chas Axel PT 05/16/2020, 12:49 PM

## 2020-05-16 NOTE — Progress Notes (Signed)
Occupational Therapy Discharge Summary  Patient Details  Name: [name] [name] MRN: [id number] Date of Birth: Jun 04, 1929  Today's Date: 05/16/2020 OT Individual Time:first session: 1005-1105 ; second session: 1300-1347 OT Individual Time Calculation (min): first session: 60 min; second session: 47 min   Patient has met 11 of 14 long term goals due to improved activity tolerance, improved balance, ability to compensate for deficits and functional use of  LEFT upper extremity.  Patient to discharge at overall Modified Independent to supervision level.  Patient's care partner is independent to provide the necessary cognitive assistance at discharge.    Skilled OT intervention:  First session: Pt sitting up in recliner, no c/o pain, requesting to bathe sinkside.  Pt completed all functional transfers with supervision using RW and at sinkside.  Pt completed UB/LB bathing and dressing sitting and standing sinkside per below levels of assist.  Pt provided reacher, walker bag, and reacher bag and educated on use.  Pt demonstrated independence to retrieve ADL items needed in sitting with reacher.  Pt also utilized reacher to assist threading right pant leg.  Pt required extra time to complete dressing tasks but did so with mod I.  Pt returned to recliner, call bell in reach.  Second session: Pt sitting up in recliner, no c/o pain, agreeable to OT session.  Pt completed SPT to w/c using RW with supervision.  Pt transported to ADL kitchen.  Pt reports he completes simple meal prep including making coffee, frying egg once in a while, and making sandwiches.  His dtr cooks most of his meals otherwise.  Pt also reports his dtr and son in law are going to complete all his laundry for him.  Pt ambulated to kitchen counter and poured water into coffee pot and retrieved/place coffee filter in coffee pot with mod I at RW level.  Pt educated on environmental modifications to prevent excess bending into lower cabinets.  Pt  also educated on using only top rack of dishwasher to follow spinal precautions.  Educated pt on use of small containers and avoiding large gallon beverages to prevent heavy lifting.  Pt reported good understanding.  Transported back to room, completed SPT w/c to EOB with supervision and sit to supine mod I.  Call bell in reach, bed alarm on.  Reasons goals not met: Pt requires minimal intermittent VCs for problem solving and safety during functional mobility using RW.  Recommendation:  Patient will benefit from ongoing skilled OT services in home health setting to continue to advance functional skills in the area of BADL and iADL. Significant left biceps, forearm, and wrist weakness needing continued strengthening and neuro re-ed to improve functional use.    Equipment: BSC  Reasons for discharge: discharge from hospital  Patient/family agrees with progress made and goals achieved: Yes    OT Discharge Precautions/Restrictions  Precautions Precautions: Cervical;Fall Pain Pain Assessment Pain Scale: 0-10 Pain Score: 0-No pain ADL ADL Equipment Provided: Reacher, Other (comment) (walker and reacher bag) Eating: Independent Where Assessed-Eating: Chair Grooming: Independent Where Assessed-Grooming: Sitting at sink Upper Body Bathing: Independent Where Assessed-Upper Body Bathing: Sitting at sink, Shower Lower Body Bathing: Supervision/safety Where Assessed-Lower Body Bathing: Shower, Standing at sink, Sitting at sink Upper Body Dressing: Independent Where Assessed-Upper Body Dressing: Sitting at sink Lower Body Dressing: Independent Where Assessed-Lower Body Dressing: Sitting at sink, Standing at sink Toileting: Independent Where Assessed-Toileting: Glass blower/designer: Distant supervision Armed forces technical officer Method: Counselling psychologist: Radiographer, therapeutic: Not assessed Social research officer, government: Close  supervision Social research officer, government  Method: Heritage manager: Civil engineer, contracting with back Vision Baseline Vision/History: Wears glasses Wears Glasses: At all times Patient Visual Report: No change from baseline Vision Assessment?: No apparent visual deficits Perception  Perception: Within Functional Limits Praxis Praxis: Intact Cognition Overall Cognitive Status: Within Functional Limits for tasks assessed Arousal/Alertness: Awake/alert Orientation Level: Oriented X4 Attention: Sustained;Selective;Divided Sustained Attention: Appears intact Selective Attention: Appears intact Divided Attention: Appears intact Memory: Appears intact Awareness: Appears intact Problem Solving: Appears intact Executive Function: Sequencing Sequencing: Appears intact Safety/Judgment: Appears intact Sensation Sensation Light Touch: Appears Intact Hot/Cold: Appears Intact Proprioception: Appears Intact Coordination Gross Motor Movements are Fluid and Coordinated: No Fine Motor Movements are Fluid and Coordinated: No Coordination and Movement Description: B LE intact, L UE decreased d/t C5 nerve palsy Finger Nose Finger Test: intact on Rt, unable on Lt due to decreased bicep activation Motor  Motor Motor: Other (comment) Motor - Skilled Clinical Observations: Left biceps, forearm, and wrist extension weakness Mobility  Bed Mobility Bed Mobility: Rolling Right;Right Sidelying to Sit Rolling Right: Independent Right Sidelying to Sit: Independent Transfers Sit to Stand: Independent with assistive device Stand to Sit: Independent with assistive device  Trunk/Postural Assessment  Cervical Assessment Cervical Assessment: Exceptions to [facility] (forward flexed posture cervical) Thoracic Assessment Thoracic Assessment: Exceptions to [facility] (mild kyphosis) Lumbar Assessment Lumbar Assessment: Exceptions to [facility] (mild posterior pelvic tilt) Postural Control Postural Control: Within Functional Limits  Balance Balance Balance  Assessed: Yes Dynamic Sitting Balance Sitting balance - Comments: Patient able to maintain appropriate posture and balance seated without back support or UE support Dynamic Standing Balance Dynamic Standing - Comments: supervision, due to spinal precautions, testing dynamic balance functionally only within limits Extremity/Trunk Assessment RUE Assessment RUE Assessment: Within Functional Limits General Strength Comments: grossly 5/5 LUE Assessment Passive Range of Motion (PROM) Comments: WNL General Strength Comments: biceps 2-/5, Triceps 4+/5, forearm 3-/5, wrist ext 3/5   Terra Aveni L Tashaun Obey 05/16/2020, 12:03 PM

## 2020-05-16 NOTE — Progress Notes (Signed)
Physical Therapy Session Note  Patient Details  Name: KAVI ALMQUIST MRN: [id number] Date of Birth: July 18, 1929  Today's Date: 05/16/2020 PT Individual Time: 3013-1438 PT Individual Time Calculation (min): 42 min   Short Term Goals: Week 2:     Skilled Therapeutic Interventions/Progress Updates:     Pt received supine in bed and agreeable to therapy. Reports pain in neck. Number not provided. PT reviews spinal precautions. Supine to sit with logrolling technique and mod(I) for increased time. Pt performs stand step transfer to [facility] with RW and mod(I). WC transport to virtual apartment for energy conservation. Pt performs transfers from recliner and couch with supervision with cues for sequencing.  Pt completes TUG with RW. Average of x3 trials is 18.2 seconds. Pt then completes 5x Sit to Stand with use of RUE for assistance, with time of 19.9 seconds.  Pt ambulates 100' with quad cane in RUE and CGA from PT. Left seated in WC with alarm intact and all needs within reach.  Therapy Documentation Precautions:  Precautions Precautions: Cervical, Fall Restrictions Weight Bearing Restrictions: No  Therapy/Group: Individual Therapy  Breck Coons, PT, DPT 05/16/2020, 3:34 PM

## 2020-05-17 ENCOUNTER — Other Ambulatory Visit: Payer: Self-pay

## 2020-05-17 ENCOUNTER — Telehealth: Payer: Self-pay

## 2020-05-17 LAB — GLUCOSE, CAPILLARY: Glucose-Capillary: 121 mg/dL — ABNORMAL HIGH (ref 70–99)

## 2020-05-17 MED ORDER — TRAZODONE HCL 50 MG PO TABS: 25.0000 mg | ORAL_TABLET | Freq: Every day | ORAL | 0 refills | Status: DC

## 2020-05-17 MED ORDER — POLYETHYLENE GLYCOL 3350 17 G PO PACK: 17.0000 g | PACK | Freq: Two times a day (BID) | ORAL | 0 refills | Status: DC

## 2020-05-17 MED ORDER — HYDROCODONE-ACETAMINOPHEN 5-325 MG PO TABS: 1.0000 | ORAL_TABLET | Freq: Two times a day (BID) | ORAL | 0 refills | Status: DC | PRN

## 2020-05-17 MED ORDER — MELATONIN 3 MG PO TABS: 3.0000 mg | ORAL_TABLET | Freq: Every day | ORAL | 0 refills | Status: DC

## 2020-05-17 MED ORDER — DEXAMETHASONE 0.5 MG PO TABS: ORAL_TABLET | ORAL | 0 refills | Status: DC

## 2020-05-17 MED ORDER — TAMSULOSIN HCL 0.4 MG PO CAPS: 0.8000 mg | ORAL_CAPSULE | Freq: Every day | ORAL | 0 refills | Status: DC

## 2020-05-17 MED ORDER — SENNOSIDES-DOCUSATE SODIUM 8.6-50 MG PO TABS: 2.0000 | ORAL_TABLET | Freq: Two times a day (BID) | ORAL | 0 refills | Status: DC

## 2020-05-17 MED ORDER — ACETAMINOPHEN 325 MG PO TABS: 325.0000 mg | ORAL_TABLET | ORAL | Status: DC | PRN

## 2020-05-17 NOTE — Progress Notes (Signed)
[name] [name] PHYSICAL MEDICINE & REHABILITATION PROGRESS NOTE  Subjective/Complaints: Patient seen laying in bed this morning.  States he slept well overnight.  He has questions regarding discharge pain medications.  He is ready for discharge.  ROS: Denies CP, SOB, N/V/D  Objective: Vital Signs: Blood pressure 117/68, pulse 69, temperature 97.9 F (36.6 C), resp. rate 15, height 5\' 11"  (1.803 m), weight 79.2 kg, SpO2 98 %. No results found. Recent Labs    05/15/20 0553  WBC 12.9*  HGB 11.5*  HCT 34.4*  PLT 153   No results for input(s): NA, K, CL, CO2, GLUCOSE, BUN, CREATININE, CALCIUM in the last 72 hours.  Physical Exam: BP 117/68 (BP Location: Right Arm)    Pulse 69    Temp 97.9 F (36.6 C)    Resp 15    Ht 5\' 11"  (1.803 m)    Wt 79.2 kg    SpO2 98%    BMI 24.35 kg/m  Constitutional: No distress . Vital signs reviewed. HENT: Normocephalic.  Atraumatic. Eyes: EOMI. No discharge. Cardiovascular: No JVD.  RRR. Respiratory: Normal effort.  No stridor.  Bilateral clear to auscultation. GI: Non-distended.  BS +. Skin: Warm and dry.  Neck incision CDI Psych: Normal mood.  Normal behavior. Musc: No edema in extremities.  No tenderness in extremities. Neuro: Alert Motor: RUE/RLE: 5/5 proximal distal, unchanged RLE: 4+/5 proximal distally, unchanged LUE: Shoulder abduction 2/5, EF 2/5, elbow extension 2+/5, handgrip 4-/5, stable  Assessment/Plan: 1. Functional deficits secondary to C5 nerve root palsy status post C3-C6 posterior fusion which require 3+ hours per [name] of interdisciplinary therapy in a comprehensive inpatient rehab setting.  Physiatrist is providing close team supervision and 24 hour management of active medical problems listed below.  Physiatrist and rehab team continue to assess barriers to discharge/monitor patient progress toward functional and medical goals  Care Tool:  Bathing    Body parts bathed by patient: Left arm, Chest, Abdomen, Front perineal  area, Right upper leg, Left upper leg, Face, Buttocks, Right lower leg, Left lower leg, Right arm   Body parts bathed by helper: Right arm     Bathing assist Assist Level: Supervision/Verbal cueing     Upper Body Dressing/Undressing Upper body dressing   What is the patient wearing?: Pull over shirt    Upper body assist Assist Level: Independent    Lower Body Dressing/Undressing Lower body dressing      What is the patient wearing?: Pants, Underwear/pull up     Lower body assist Assist for lower body dressing: Independent     Toileting Toileting    Toileting assist Assist for toileting: Independent with assistive device     Transfers Chair/bed transfer  Transfers assist     Chair/bed transfer assist level: Independent with assistive device Chair/bed transfer assistive device: Museum/gallery exhibitions officer assist      Assist level: Contact Guard/Touching assist Assistive device: Cane-quad Max distance: 100'   Walk 10 feet activity   Assist     Assist level: Contact Guard/Touching assist Assistive device: Cane-quad   Walk 50 feet activity   Assist    Assist level: Contact Guard/Touching assist Assistive device: Cane-quad    Walk 150 feet activity   Assist    Assist level: Supervision/Verbal cueing Assistive device: Walker-rolling    Walk 10 feet on uneven surface  activity   Assist     Assist level: Supervision/Verbal cueing Assistive device: Aeronautical engineer Will  patient use wheelchair at discharge?: No             Wheelchair 50 feet with 2 turns activity    Assist            Wheelchair 150 feet activity     Assist            Medical Problem List and Plan: 1.  Impaired function/C5 nerve root dyspraxia/palsy s/p C3-C6 posterior fusion with LUE weakness/N/T and impaired balance, gait and LUE movement  DC today  Will see patient for transitional care  management in 1-2 weeks post-discharge  Decreased dexamethasone to 0.5mg  BID on 9/6 x3 days, then daily x3 days, then stop.  2.  Antithrombotics: -DVT/anticoagulation:  Mechanical: Sequential compression devices, below knee Bilateral lower extremities             -antiplatelet therapy: Aspirin restarted 3. Pain Management:  Changed Oxy to Norco and robaxin as needed. Requiring Norco. Helping with his neck and back pain.    -discussed appropriate positioning of LUE  Relatively controlled with meds on 9/8  Monitor with increased exertion 4. Mood: LCSW to follow for evaluation and support.              -antipsychotic agents: N/A 5. Neuropsych: This patient is capable of making decisions on his own behalf. 6. Skin/Wound Care: Routine pressure relief measures. D/c IV 7. Fluids/Electrolytes/Nutrition: Monitor I/Os.  8. HTN: Monitor BP --continue coreg and Lisinopril daily. Also on Flomax 0.8mg   Slightly labile on 9/8, monitor for trend in ambulatory setting 9. BPH: On Flomax daily.   Monitor for retention 10.  AKI on CKD:   Cr.  1.75 on 8/31 11. Steroid induced hyperglycemia on T2DM with hyperglycemia: Diet controlled. Monitor with ac/hs and use SSI for elevated BS especially as on steroids.   Dietary consult ordered  Slightly elevated on 9/7 12. Drug induced constipation: Continue senna S.  Added miralax also and order enema tonight.   Bowel meds increased again on 8/31 13. Hyponatremia  Na 133 on 8/31 14.  Hypoalbuminemia  Supplement initiated on 8/27 15.  Transaminitis  AST elevated on 8/27-->stable to improved 8/30 13.  Leukocytosis-likely drug-induced  WBCs 12.9 on 9/6   Continue to monitor 14. ?Acute on Anemia of chronic disease   Hb 11.5 on 9/6 15.  Wheezing-patient states baseline, states he did not take any medications at home for this  Chest x-ray stable  As needed albuterol  16.  Sleep disturbance: started melatonin HS  Trazodone as needed  Improving 17. Mouth  pain  Improved  > 30 minutes spent in total in discharge planning between myself and PA regarding aforementioned, as well discussion regarding DME equipment, follow-up appointments, follow-up therapies, discharge medications, discharge recommendations  LOS: 13 days A FACE TO FACE EVALUATION WAS PERFORMED  Allysia Ingles Lorie Phenix 05/17/2020, 10:11 AM

## 2020-05-17 NOTE — Progress Notes (Signed)
Patient discharged home with family, all belongings sent with patient. No complications noted at this time. Giavana Rooke D Ran Tullis, LPN 

## 2020-05-17 NOTE — Telephone Encounter (Signed)
2nd attempt- Left message on voicemail to return my call- need to complete TCM and schedule follow up visit

## 2020-05-17 NOTE — Discharge Summary (Addendum)
Physician Discharge Summary  Patient ID: [name] [name] MRN: [id number] DOB/AGE: 05-26-1929 86 y.o.  Admit date: 05/04/2020 Discharge date: 05/17/2020  Discharge Diagnoses:  Principal Problem:   Stenosis of cervical spine with myelopathy (HCC) Active Problems:   AKI (acute kidney injury) ([place])   Steroid-induced hyperglycemia   Controlled type 2 diabetes mellitus with hyperglycemia, without long-term current use of insulin (HCC)   Drug induced constipation   Hyponatremia   Hypoalbuminemia due to protein-calorie malnutrition (HCC)   Transaminitis   Leucocytosis   Acute on chronic anemia   Discharged Condition: stable   Significant Diagnostic Studies: N/A   Labs:  Basic Metabolic Panel: BMP Latest Ref Rng & Units 05/09/2020 05/08/2020 05/05/2020  Glucose 70 - 99 mg/dL 156(H) 172(H) 184(H)  BUN 8 - 23 mg/dL 67(H) 76(H) 84(H)  Creatinine 0.61 - 1.24 mg/dL 1.75(H) 1.76(H) 2.06(H)  Sodium 135 - 145 mmol/L 133(L) 131(L) 132(L)  Potassium 3.5 - 5.1 mmol/L 4.8 5.0 4.7  Chloride 98 - 111 mmol/L 101 101 101  CO2 22 - 32 mmol/L 23 22 20(L)  Calcium 8.9 - 10.3 mg/dL 9.5 8.8(L) 9.1    CBC: CBC Latest Ref Rng & Units 05/15/2020 05/08/2020 05/05/2020  WBC 4.0 - 10.5 K/uL 12.9(H) 10.2 13.3(H)  Hemoglobin 13.0 - 17.0 g/dL 11.5(L) 11.2(L) 11.9(L)  Hematocrit 39 - 52 % 34.4(L) 33.4(L) 34.5(L)  Platelets 150 - 400 K/uL 153 164 182    CBG: Recent Labs  Lab 05/16/20 0556 05/16/20 1128 05/16/20 1656 05/16/20 2105 05/17/20 0603  GLUCAP 106* 139* 121* 110* 121*    Brief HPI:   [name] [name] is a 86 y.o. male with history of GERD, essential tremor, COVID-19 02/2020, gait disturbance with numbness bilateral hands due to HNP spondylolysis and severe canal stenosis C3-C6.  He was admitted on 05/01/2020 for posterior cervical arthrodesis C3-C6 by Dr. Ronnald Ramp.  Postop noted to have LUE weakness and Decadron added due to concerns of C5 nerve palsy.  Follow-up MRI C-spine showed expected postop  change without hemorrhage or stenosis C3/C6.  He continues to have limitations due to ataxic gait as well as LUE.  CIR was recommended due to functional decline.   Hospital Course: [name] was admitted to rehab 05/04/2020 for inpatient therapies to consist of PT and OT at least three hours five days a week. Past admission physiatrist, therapy team and rehab RN have worked together to provide customized collaborative inpatient rehab. Pain has been managed with as needed use of Norco.  SCDs were used for DVT prophylaxis.  Hemoglobin A1c was elevated at 6.3 showing evidence of early diabetes and steroid-induced hyperglycemia managed with use of sliding scale insulin during his stay.  He was educated maintaining carb modified diet at discharge.  Bowel program was added and titrated to help manage constipation.  Serial check of BMET showed mild hyponatremia that is resolving.  Acute on chronic renal failure is resolving. AKI is improving Follow-up CBC showed acute blood loss anemia with mild leukocytosis which is improving with taper and is likely steroid-induced. Blood pressures were monitored on TID basis and have been controlled on Coreg and lisinopril daily.  Flomax was increased to 0.8 mg to help with voiding function.  He was started on dexamethasone taper and slowly complete this in the next 6 days.  His posterior neck incision is C/D and is healing well without signs of infection.  He continues to have limitations due to LE weakness however has progressed to modified independent to supervision level.  He will continue to receive further follow-up home health therapy after discharge.   Rehab course: During patient's stay in rehab weekly team conferences were held to monitor patient's progress, set goals and discuss barriers to discharge. At admission, patient required min assist with mobility and mod assist with ADLs. He has had improvement in activity tolerance, balance, postural control as well  as ability to compensate for deficits. He is able to complete ADL tasks at modified independent to supervision level.  His he is modified independent for transfers and requires supervision for mobility.  Family education was completed with daughter.  Discharge disposition: 01-Home or Self Care  Diet: Carb modified.   Special Instructions: 1.  Needs supervision with ambulation. 2.  No lifting items over 5 pounds.  No driving till cleared by MD.  3. Recommend repeat BMET in 7-10 days to monitor renal status.    Discharge Instructions     Ambulatory referral to Physical Medicine Rehab   Complete by: As directed    Follow up TC appt in 2 weeks      Allergies as of 05/17/2020       Reactions   Sulfonamide Derivatives    REACTION: lethargic        Medication List     TAKE these medications    acetaminophen 325 MG tablet Commonly known as: TYLENOL Take 1-2 tablets (325-650 mg total) by mouth every 4 (four) hours as needed for mild pain.   allopurinol 300 MG tablet Commonly known as: ZYLOPRIM Take 1 tablet (300 mg total) by mouth daily.   aspirin 81 MG tablet Take 81 mg by mouth daily.   atorvastatin 40 MG tablet Commonly known as: LIPITOR Take 1 tablet (40 mg total) by mouth daily.   CARNATION BREAKFAST ESSENTIALS PO Take 1 Package by mouth daily.   carvedilol 20 MG 24 hr capsule Commonly known as: COREG CR Take 1 capsule (20 mg total) by mouth daily.   dexamethasone 0.5 MG tablet Commonly known as: DECADRON Take 1/2 pill twice a day for three days then decrease to once a day till gone.   HYDROcodone-acetaminophen 5-325 MG tablet--Rx# 14pills Commonly known as: NORCO/VICODIN Take 1 tablet by mouth every 12 (twelve) hours as needed for severe pain.   lisinopril 20 MG tablet Commonly known as: ZESTRIL Take 1 tablet (20 mg total) by mouth daily.   melatonin 3 MG Tabs tablet Take 1 tablet (3 mg total) by mouth at bedtime.   omeprazole 40 MG capsule Commonly  known as: PRILOSEC Take 1 capsule (40 mg total) by mouth daily as needed. What changed: reasons to take this   polyethylene glycol 17 g packet Commonly known as: MIRALAX / GLYCOLAX Take 17 g by mouth 2 (two) times daily.   primidone 50 MG tablet Commonly known as: MYSOLINE Take 0.5 tablets (25 mg total) by mouth daily.   senna-docusate 8.6-50 MG tablet Commonly known as: Senokot-S Take 2 tablets by mouth 2 (two) times daily.   tamsulosin 0.4 MG Caps capsule Commonly known as: FLOMAX Take 2 capsules (0.8 mg total) by mouth daily. What changed: how much to take   traZODone 50 MG tablet Commonly known as: DESYREL Take 0.5 tablets (25 mg total) by mouth at bedtime.   vitamin B-12 1000 MCG tablet Commonly known as: CYANOCOBALAMIN Take 1 tablet (1,000 mcg total) by mouth daily.        Follow-up Information     Jamse Arn, MD Follow up.   Specialty: Physical Medicine  and Rehabilitation Why: Office will call you with follow up appt Contact information: [address] [place] [address] [phone]         Tonia Ghent, MD. Call in 1 day(s).   Specialty: Family Medicine Why: for post hospital follow up Contact information: [place] [address] [phone]         Eustace Moore, MD. Call in 1 day(s).   Specialty: Neurosurgery Why: for  post op check Contact information: [address] [place] [address] [phone]                 Signed: Bary Leriche 05/17/2020, 4:45 PM Patient was seen, face-face, and physical exam performed by me on day of discharge, greater than 30 minutes of total time spent.. Please see progress note from day of discharge as well.  Delice Lesch, MD, ABPMR

## 2020-05-17 NOTE — Telephone Encounter (Signed)
1st attempt- Left message on voicemail to return my call- need to complete TCM and schedule follow up visit.  °

## 2020-05-17 NOTE — Discharge Instructions (Signed)
Inpatient Rehab Discharge Instructions  [name] [name] Discharge date and time: 05/17/20   Activities/Precautions/ Functional Status: Activity: no lifting, driving, or strenuous exercise till cleared by MD Diet: diabetic diet Wound Care: keep wound clean and dry Contact Dr. Ronnald Ramp if you develop any problems with your incision/wound--redness, swelling, increase in pain, drainage or if you develop fever or chills.    Functional status:  ___ No restrictions     ___ Walk up steps independently _X__ 24/7 supervision/assistance   ___ Walk up steps with assistance ___ Intermittent supervision/assistance  ___ Bathe/dress independently ___ Walk with walker     ___ Bathe/dress with assistance ___ Walk Independently    ___ Shower independently _X__ Walk with supervision    _X__ Shower with assistance _X__ No alcohol     ___ Return to work/school ________  Special Instructions:  COMMUNITY REFERRALS UPON DISCHARGE:    Home Health:   PT, OT, [facility] Equipment/Items Ordered:3 IN 1                                  AGENCY: [facility] [phone]                                                   My questions have been answered and I understand these instructions. I will adhere to these goals and the provided educational materials after my discharge from the hospital.  Patient/Caregiver Signature _______________________________ Date __________  Clinician Signature _______________________________________ Date __________  Please bring this form and your medication list with you to all your follow-up doctor's appointments.

## 2020-05-17 NOTE — Consult Note (Signed)
   [facility] CM Inpatient Consult   05/17/2020  HAJI DELAINE [birth year] [id number]   Referral from Inpatient Rehab CM:  Patient was referred for additional post rehab follow up for complex disease management.  Natividad Brood, RN BSN [facility] Liaison  [phone] business mobile phone Toll free office [phone]  Fax number: [phone] [email or web address]

## 2020-05-18 ENCOUNTER — Other Ambulatory Visit: Payer: Self-pay | Admitting: *Deleted

## 2020-05-18 NOTE — Patient Outreach (Signed)
[facility]) Care Management  05/18/2020  CAROLE DEERE Jul 22, 1929 [id number]   Received Referral 9/08 Provider to complete Transition of Care Telephone Assessment  RN attempted the initial outreach however unsuccessful. RN able to leave a HIPAA approved voice message requesting a call back.   Plan: Will further assessment on the call back and send outreach letter.  [name], RN Care Management Coordinator [place] Office [phone]

## 2020-05-19 DIAGNOSIS — E119 Type 2 diabetes mellitus without complications: Secondary | ICD-10-CM | POA: Diagnosis not present

## 2020-05-19 DIAGNOSIS — I10 Essential (primary) hypertension: Secondary | ICD-10-CM | POA: Diagnosis not present

## 2020-05-19 DIAGNOSIS — Z8546 Personal history of malignant neoplasm of prostate: Secondary | ICD-10-CM | POA: Diagnosis not present

## 2020-05-19 DIAGNOSIS — M4712 Other spondylosis with myelopathy, cervical region: Secondary | ICD-10-CM | POA: Diagnosis not present

## 2020-05-19 DIAGNOSIS — I4510 Unspecified right bundle-branch block: Secondary | ICD-10-CM | POA: Diagnosis not present

## 2020-05-19 DIAGNOSIS — G250 Essential tremor: Secondary | ICD-10-CM | POA: Diagnosis not present

## 2020-05-19 DIAGNOSIS — K5903 Drug induced constipation: Secondary | ICD-10-CM | POA: Diagnosis not present

## 2020-05-19 DIAGNOSIS — M4802 Spinal stenosis, cervical region: Secondary | ICD-10-CM | POA: Diagnosis not present

## 2020-05-19 DIAGNOSIS — N400 Enlarged prostate without lower urinary tract symptoms: Secondary | ICD-10-CM | POA: Diagnosis not present

## 2020-05-19 DIAGNOSIS — E785 Hyperlipidemia, unspecified: Secondary | ICD-10-CM | POA: Diagnosis not present

## 2020-05-19 DIAGNOSIS — Z4789 Encounter for other orthopedic aftercare: Secondary | ICD-10-CM | POA: Diagnosis not present

## 2020-05-19 DIAGNOSIS — E46 Unspecified protein-calorie malnutrition: Secondary | ICD-10-CM | POA: Diagnosis not present

## 2020-05-19 DIAGNOSIS — M109 Gout, unspecified: Secondary | ICD-10-CM | POA: Diagnosis not present

## 2020-05-19 DIAGNOSIS — Z79899 Other long term (current) drug therapy: Secondary | ICD-10-CM | POA: Diagnosis not present

## 2020-05-19 DIAGNOSIS — K219 Gastro-esophageal reflux disease without esophagitis: Secondary | ICD-10-CM | POA: Diagnosis not present

## 2020-05-19 DIAGNOSIS — Z8616 Personal history of COVID-19: Secondary | ICD-10-CM | POA: Diagnosis not present

## 2020-05-19 DIAGNOSIS — I2510 Atherosclerotic heart disease of native coronary artery without angina pectoris: Secondary | ICD-10-CM | POA: Diagnosis not present

## 2020-05-19 DIAGNOSIS — M4315 Spondylolisthesis, thoracolumbar region: Secondary | ICD-10-CM | POA: Diagnosis not present

## 2020-05-19 DIAGNOSIS — N179 Acute kidney failure, unspecified: Secondary | ICD-10-CM | POA: Diagnosis not present

## 2020-05-19 DIAGNOSIS — Z981 Arthrodesis status: Secondary | ICD-10-CM | POA: Diagnosis not present

## 2020-05-19 DIAGNOSIS — M2578 Osteophyte, vertebrae: Secondary | ICD-10-CM | POA: Diagnosis not present

## 2020-05-19 DIAGNOSIS — Z7982 Long term (current) use of aspirin: Secondary | ICD-10-CM | POA: Diagnosis not present

## 2020-05-19 DIAGNOSIS — D649 Anemia, unspecified: Secondary | ICD-10-CM | POA: Diagnosis not present

## 2020-05-19 DIAGNOSIS — H9190 Unspecified hearing loss, unspecified ear: Secondary | ICD-10-CM | POA: Diagnosis not present

## 2020-05-19 DIAGNOSIS — M50023 Cervical disc disorder at C6-C7 level with myelopathy: Secondary | ICD-10-CM | POA: Diagnosis not present

## 2020-05-22 ENCOUNTER — Telehealth: Payer: Self-pay

## 2020-05-22 DIAGNOSIS — Z4789 Encounter for other orthopedic aftercare: Secondary | ICD-10-CM | POA: Diagnosis not present

## 2020-05-22 DIAGNOSIS — M4712 Other spondylosis with myelopathy, cervical region: Secondary | ICD-10-CM | POA: Diagnosis not present

## 2020-05-22 DIAGNOSIS — G250 Essential tremor: Secondary | ICD-10-CM | POA: Diagnosis not present

## 2020-05-22 DIAGNOSIS — M50023 Cervical disc disorder at C6-C7 level with myelopathy: Secondary | ICD-10-CM | POA: Diagnosis not present

## 2020-05-22 DIAGNOSIS — M4315 Spondylolisthesis, thoracolumbar region: Secondary | ICD-10-CM | POA: Diagnosis not present

## 2020-05-22 DIAGNOSIS — M4802 Spinal stenosis, cervical region: Secondary | ICD-10-CM | POA: Diagnosis not present

## 2020-05-22 NOTE — Telephone Encounter (Signed)
Sharyn Lull, OT from [facility] called requesting verbal orders for 2wk3, 1wk3. Orders approved and given.

## 2020-05-23 DIAGNOSIS — M4712 Other spondylosis with myelopathy, cervical region: Secondary | ICD-10-CM | POA: Diagnosis not present

## 2020-05-23 DIAGNOSIS — M50023 Cervical disc disorder at C6-C7 level with myelopathy: Secondary | ICD-10-CM | POA: Diagnosis not present

## 2020-05-23 DIAGNOSIS — Z4789 Encounter for other orthopedic aftercare: Secondary | ICD-10-CM | POA: Diagnosis not present

## 2020-05-23 DIAGNOSIS — M4802 Spinal stenosis, cervical region: Secondary | ICD-10-CM | POA: Diagnosis not present

## 2020-05-23 DIAGNOSIS — G250 Essential tremor: Secondary | ICD-10-CM | POA: Diagnosis not present

## 2020-05-23 DIAGNOSIS — M4315 Spondylolisthesis, thoracolumbar region: Secondary | ICD-10-CM | POA: Diagnosis not present

## 2020-05-24 ENCOUNTER — Other Ambulatory Visit: Payer: Self-pay | Admitting: *Deleted

## 2020-05-24 DIAGNOSIS — G250 Essential tremor: Secondary | ICD-10-CM | POA: Diagnosis not present

## 2020-05-24 DIAGNOSIS — M4802 Spinal stenosis, cervical region: Secondary | ICD-10-CM | POA: Diagnosis not present

## 2020-05-24 DIAGNOSIS — M50023 Cervical disc disorder at C6-C7 level with myelopathy: Secondary | ICD-10-CM | POA: Diagnosis not present

## 2020-05-24 DIAGNOSIS — M4712 Other spondylosis with myelopathy, cervical region: Secondary | ICD-10-CM | POA: Diagnosis not present

## 2020-05-24 DIAGNOSIS — M4315 Spondylolisthesis, thoracolumbar region: Secondary | ICD-10-CM | POA: Diagnosis not present

## 2020-05-24 DIAGNOSIS — Z4789 Encounter for other orthopedic aftercare: Secondary | ICD-10-CM | POA: Diagnosis not present

## 2020-05-24 NOTE — Patient Outreach (Signed)
[facility] [facility]) Care Management  05/24/2020  [name] [name] [birth year] [id number]   Telephone Assessment-Unsuccessful  Outreach #2 RN attempted outreach however unsuccessful. RN left a HIPAA approved  voice message on both the Home/Mobile requesting a call back. Will further engage at that time.  Plan: Will scheduled another outreach call over the next week.  [name], RN Care Management Coordinator [place] Office [phone]

## 2020-05-25 ENCOUNTER — Other Ambulatory Visit: Payer: Self-pay

## 2020-05-25 ENCOUNTER — Encounter: Payer: Medicare Other | Attending: Registered Nurse | Admitting: Registered Nurse

## 2020-05-25 ENCOUNTER — Encounter: Payer: Self-pay | Admitting: Registered Nurse

## 2020-05-25 VITALS — BP 161/86 | HR 68 | Temp 98.9°F | Wt 174.6 lb

## 2020-05-25 DIAGNOSIS — I25810 Atherosclerosis of coronary artery bypass graft(s) without angina pectoris: Secondary | ICD-10-CM

## 2020-05-25 DIAGNOSIS — I10 Essential (primary) hypertension: Secondary | ICD-10-CM | POA: Diagnosis not present

## 2020-05-25 DIAGNOSIS — Z981 Arthrodesis status: Secondary | ICD-10-CM

## 2020-05-25 DIAGNOSIS — E7849 Other hyperlipidemia: Secondary | ICD-10-CM | POA: Diagnosis not present

## 2020-05-25 NOTE — Progress Notes (Signed)
Subjective:    Patient ID: [name] [name], male    DOB: 1929/07/03, 86 y.o.   MRN: [id number]  HPI: [name] [name] is a 86 y.o. male who was admitted to [facility] on 05/01/2020 for  Posterior cervical fusion with lateral mass fixation - Cervical three - Cervical six, laminectomy Cervical three-six, by Dr Ronnald Ramp. He had a history of gait disturbance with numbness to bilateral hands  Due to HNP ( Herniated Nucleus Pulposus), Spondylosis  Cervical Stenosis C-3-C-6 with Myelopathy. Post-Op Noted with LUE Weakness due to concerns of C5 Nerve palsy.  DG C-Arm:  FINDINGS: 3 C-arm fluoroscopic images were obtained intraoperatively and submitted for post operative interpretation. Posterior rod and screw fixation at C3-C6. 21 seconds of fluoroscopy time was utilized. Please see the performing provider's procedural report for further detail.  IMPRESSION: As above. MR Cervical Spine WO Contrast: 05/03/2020 IMPRESSION: 1. Status post C3 through C6 posterior rod screw fixation with decompression of the canal and no substantial canal stenosis at these levels. Expected postoperative findings without evidence of large epidural canal hemorrhage. T2 hypointense signal within the ventral canal is favored to reflect prominent CSF pulsation artifact secondary to re-expansion of the canal. 2. Redemonstrated T2/STIR hyperintense signal within the cord at the C5-C6 level with mild cord volume loss, compatible with myelomalacia related to prior canal stenosis. 3. Similar moderate to severe foraminal stenosis at multiple levels, as detailed above. 4. Mild to moderate canal stenosis at C6-C7.  [name] [name] is here for hospital follow up of his Cervical Spinal Fusion, Essential Hypertension, Hyperlipidemia and Controlled Type 2 Diabetes Mellitus.   [name] [name] was admitted to inpatient Rehabilitation on 05/04/2020 and discharged home on 05/17/2020, He is receiving [facility] with  [facility]. He states he has pain in his neck. He rates his pain 4. Also reports his appetite is improving.   Daughter in room.      Pain Inventory Average Pain 4 Pain Right Now 4 My pain is constant and dull  In the last 24 hours, has pain interfered with the following? General activity 5 Relation with others 0 Enjoyment of life 5 What TIME of day is your pain at its worst? varies Sleep (in general) Fair  Pain is worse with: bending and sitting Pain improves with: rest Relief from Meds: fair  use a walker ability to climb steps?  yes do you drive?  no transfers alone Do you have any goals in this area?  yes  retired  No problems in this area weakness numbness trouble walking  NEW PATIENT  NEW PATIENT    Family History  Problem Relation Age of Onset  . Heart disease Mother   . Cancer Mother   . Cancer Sister   . Diabetes Sister   . Cancer Brother        prostate  . Prostate cancer Brother   . Hypothyroidism Brother   . Depression Neg Hx   . Alcohol abuse Neg Hx   . Drug abuse Neg Hx   . Stroke Neg Hx   . Colon cancer Neg Hx    Social History   Socioeconomic History  . Marital status: Married    Spouse name: Not on file  . Number of children: 1  . Years of education: Not on file  . Highest education level: Not on file  Occupational History  . Occupation: Retired Pharmacist, community  Tobacco Use  . Smoking status: Never Smoker  . Smokeless  tobacco: Never Used  Vaping Use  . Vaping Use: Never used  Substance and Sexual Activity  . Alcohol use: No  . Drug use: No  . Sexual activity: Never  Other Topics Concern  . Not on file  Social History Narrative   Widowed 2020 after 81+ years of marriage, lives with daughter.     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 [employer]   Agent orange exposure.  Sig service related noise exposure.     Right handed   Social Determinants of Health   Financial Resource Strain:   . Difficulty of Paying Living  Expenses: Not on file  Food Insecurity:   . Worried About Charity fundraiser in the Last Year: Not on file  . Ran Out of Food in the Last Year: Not on file  Transportation Needs:   . Lack of Transportation (Medical): Not on file  . Lack of Transportation (Non-Medical): Not on file  Physical Activity:   . Days of Exercise per Week: Not on file  . Minutes of Exercise per Session: Not on file  Stress:   . Feeling of Stress : Not on file  Social Connections:   . Frequency of Communication with Friends and Family: Not on file  . Frequency of Social Gatherings with Friends and Family: Not on file  . Attends Religious Services: Not on file  . Active Member of Clubs or Organizations: Not on file  . Attends Archivist Meetings: Not on file  . Marital Status: Not on file   Past Surgical History:  Procedure Laterality Date  . Adenosine myoview  01/24/2006   Ischemia by EKG, Cath  . CATARACT EXTRACTION W/ INTRAOCULAR LENS IMPLANT Left 10/2013  . CHOLECYSTECTOMY N/A 12/06/2013   Procedure: Diagnostic Laparoscopy for  drainage Intrabdominal abcess;  Surgeon: Rolm Bookbinder, MD;  Location: [place];  Service: General;  Laterality: N/A;  . CHOLECYSTECTOMY N/A 03/17/2014   Procedure: LAPAROSCOPIC CHOLECYSTECTOMY ;  Surgeon: Rolm Bookbinder, MD;  Location: [place];  Service: General;  Laterality: N/A;  . COLONOSCOPY    . CORONARY ARTERY BYPASS GRAFT  2007   CABGX 4  . CYSTOSCOPY  02/15/2004   Mod BPH, moder tribec ?  Marland Kitchen ERCP N/A 12/06/2013   Procedure: ENDOSCOPIC RETROGRADE CHOLANGIOPANCREATOGRAPHY (ERCP);  Surgeon: Milus Banister, MD;  Location: [place];  Service: Endoscopy;  Laterality: N/A;  . LAPAROSCOPIC CHOLECYSTECTOMY  03/17/2014  . LUMBAR FUSION  02/2012   lumbar spine for spinal stenosis  . POSTERIOR CERVICAL FUSION/FORAMINOTOMY N/A 05/01/2020   Procedure: Posterior cervical fusion with lateral mass fixation - Cervical three - Cervical six, laminectomy Cervical three-six;  Surgeon:  Eustace Moore, MD;  Location: [place];  Service: Neurosurgery;  Laterality: N/A;  . PROSTATE CRYOABLATION  09/18/2005   prostate CA   Past Medical History:  Diagnosis Date  . Acute renal failure ([place]) 12/06/2013  . Back pain    occasionally  . Benign essential tremor 2005   takes Primidone daily  . Choledocholithiasis 12/06/2013  . Constipation    takes OTC stool softener  . Coronary artery disease 2007   s/p CABG, DR Johnsie Cancel  . GERD (gastroesophageal reflux disease)    takes Nexium daily  . Gout 1995   "~ once/yr" (03/17/2014)  . History of colon polyps   . Hyperlipidemia 12/2005   takes Atorvastatin daily  . Hypertension 1995   takes Lisinopril and Coreg daily  . possible HCAP (healthcare-associated pneumonia) 12/09/2013  . Prostate cancer ([place])  2007   S/P treatment, followed by Urology prev  . Right bundle branch block (RBBB)   . Stenosis of cervical spine   . Type II diabetes mellitus ([facility])    no meds;diet and exercise controlled   . Wears dentures   . Wears glasses    BP (!) 161/86   Pulse 68   Temp 98.9 F (37.2 C)   Wt 174 lb 9.6 oz (79.2 kg)   SpO2 96%   BMI 24.35 kg/m   Opioid Risk Score:   Fall Risk Score:  `1  Depression screen PHQ 2/9  Depression screen [facility] 2/9 05/25/2020 02/09/2020 12/10/2017 12/10/2016 11/29/2016 11/28/2015 10/21/2013  Decreased Interest 0 0 0 0 0 0 0  Down, Depressed, Hopeless 0 0 0 0 0 0 0  PHQ - 2 Score 0 0 0 0 0 0 0  Altered sleeping 2 - 0 - - - -  Tired, decreased energy 1 - 0 - - - -  Change in appetite 1 - 0 - - - -  Feeling bad or failure about yourself  0 - 0 - - - -  Trouble concentrating 0 - 0 - - - -  Moving slowly or fidgety/restless 0 - 0 - - - -  Suicidal thoughts 0 - 0 - - - -  PHQ-9 Score 4 - 0 - - - -  Difficult doing work/chores - - Not difficult at all - - - -   Review of Systems  Constitutional: Negative.   HENT: Negative.   Eyes: Negative.   Respiratory: Negative.   Cardiovascular: Negative.   Gastrointestinal:  Positive for constipation.  Endocrine: Negative.   Genitourinary: Negative.   Musculoskeletal: Positive for back pain, gait problem and neck pain.  Skin: Negative.   Allergic/Immunologic: Negative.   Hematological: Negative.   Psychiatric/Behavioral: Negative.   All other systems reviewed and are negative.      Objective:   Physical Exam Vitals and nursing note reviewed.  Constitutional:      Appearance: Normal appearance.  Neck:     Comments: Posterior Neck incision healing: Without signs of Infection Cardiovascular:     Rate and Rhythm: Normal rate and regular rhythm.     Pulses: Normal pulses.     Heart sounds: Normal heart sounds.  Pulmonary:     Effort: Pulmonary effort is normal.     Breath sounds: Normal breath sounds.  Musculoskeletal:     Cervical back: Normal range of motion and neck supple.     Comments: Normal Muscle Bulk and Muscle Testing Reveals:  Upper Extremities: Right: Full ROM and Muscle Strength 5/5 Left Upper Extremity: With LUE Weakness and Decreased ROM and Muscle Strength 5/5 Lower Extremities: Full ROM and Muscle Strength 5/5 Arises from Table with ease using walker for support Narrow Based  Gait   Skin:    General: Skin is warm and dry.  Neurological:     Mental Status: He is alert and oriented to person, place, and time.  Psychiatric:        Mood and Affect: Mood normal.        Behavior: Behavior normal.           Assessment & Plan:   1.S/P Cervical Spinal Fusion: Continue Home Health Therapy with [facility].  2. Essential Hypertension: Continue Current Medication Regimen. PCP Following. Continue to Monitor.  3.Hyperlipidemia: Continue Current medication regime. PCP Following. Continue to Monitor.   20  minutes of face to face patient care time  was spent during this visit. All questions were encouraged and answered.  F/U with Dr Posey Pronto in 4-6 weeks

## 2020-05-26 DIAGNOSIS — Z4789 Encounter for other orthopedic aftercare: Secondary | ICD-10-CM | POA: Diagnosis not present

## 2020-05-26 DIAGNOSIS — M4802 Spinal stenosis, cervical region: Secondary | ICD-10-CM | POA: Diagnosis not present

## 2020-05-26 DIAGNOSIS — M4315 Spondylolisthesis, thoracolumbar region: Secondary | ICD-10-CM | POA: Diagnosis not present

## 2020-05-26 DIAGNOSIS — M50023 Cervical disc disorder at C6-C7 level with myelopathy: Secondary | ICD-10-CM | POA: Diagnosis not present

## 2020-05-26 DIAGNOSIS — M4712 Other spondylosis with myelopathy, cervical region: Secondary | ICD-10-CM | POA: Diagnosis not present

## 2020-05-26 DIAGNOSIS — G250 Essential tremor: Secondary | ICD-10-CM | POA: Diagnosis not present

## 2020-05-29 DIAGNOSIS — G250 Essential tremor: Secondary | ICD-10-CM | POA: Diagnosis not present

## 2020-05-29 DIAGNOSIS — M50023 Cervical disc disorder at C6-C7 level with myelopathy: Secondary | ICD-10-CM | POA: Diagnosis not present

## 2020-05-29 DIAGNOSIS — M4712 Other spondylosis with myelopathy, cervical region: Secondary | ICD-10-CM | POA: Diagnosis not present

## 2020-05-29 DIAGNOSIS — M4802 Spinal stenosis, cervical region: Secondary | ICD-10-CM | POA: Diagnosis not present

## 2020-05-29 DIAGNOSIS — M4315 Spondylolisthesis, thoracolumbar region: Secondary | ICD-10-CM | POA: Diagnosis not present

## 2020-05-29 DIAGNOSIS — Z4789 Encounter for other orthopedic aftercare: Secondary | ICD-10-CM | POA: Diagnosis not present

## 2020-05-30 ENCOUNTER — Other Ambulatory Visit: Payer: Self-pay | Admitting: *Deleted

## 2020-05-30 ENCOUNTER — Encounter: Payer: Self-pay | Admitting: *Deleted

## 2020-05-30 DIAGNOSIS — M50023 Cervical disc disorder at C6-C7 level with myelopathy: Secondary | ICD-10-CM | POA: Diagnosis not present

## 2020-05-30 DIAGNOSIS — Z4789 Encounter for other orthopedic aftercare: Secondary | ICD-10-CM | POA: Diagnosis not present

## 2020-05-30 DIAGNOSIS — G250 Essential tremor: Secondary | ICD-10-CM | POA: Diagnosis not present

## 2020-05-30 DIAGNOSIS — M4315 Spondylolisthesis, thoracolumbar region: Secondary | ICD-10-CM | POA: Diagnosis not present

## 2020-05-30 DIAGNOSIS — M4712 Other spondylosis with myelopathy, cervical region: Secondary | ICD-10-CM | POA: Diagnosis not present

## 2020-05-30 DIAGNOSIS — M4802 Spinal stenosis, cervical region: Secondary | ICD-10-CM | POA: Diagnosis not present

## 2020-05-30 NOTE — Patient Outreach (Signed)
[facility]) Care Management  05/30/2020  [name] [name] 09/28/1928 [id number]  Telephone Assessment-Successful-Enrolled HTN program  RN spoke with pt in detail concerning [name] [name] services and the purpose for today's call. Pt receptive and able to confirm some of his ongoing issues related to HTN. Pt reports his blood pressures are around 150/80 with no preciptating symptoms. Pt reports his diabetes is controlled through his diet with no medications or monitoring. Pt reports he is eating more foods with his diet then before with ongoing attempts for a healthier diet. Pt reports his activities in the home with his ongoing issues paralysis to his left arm (unknow etiology). Pt reports an aide in the home several days weekly for 4 hours and very appreciative for her services.  Offered enrollment into the [name] program for HTN for ongoing management of care. After completing the initial assessment RN inquired if pt could monitor his blood pressure more for verifying his normal baseline without possible elevation. Pt will take his blood pressure more often and document his readings for all providers to view. Pt receptive to enrolling and aware [place] [name] will communicate his enrolled to his provider.   Plan of care discussed with goals and interventions. Offered to send education printed material discussed today and stress the importance of recognizing any precipitating symptoms and what to do if acute.   Plan: Will follow up in 2 weeks and notify pt's provided of his enrollment. Will send all [facility] letters welcoming pt into the program and send the discussed and requested material accordingly. No other inquires or request at this time.    Goals Addressed              This Visit's Progress   .  THN ("get better") (pt-stated)        CARE PLAN ENTRY (see longitudinal plan of care for additional care plan information)  Objective:  . Last practice recorded BP readings:  BP Readings from  Last 3 Encounters:  05/25/20 (!) 161/86  05/17/20 117/68  05/04/20 (!) 155/69 .   Marland Kitchen Most recent eGFR/CrCl: No results found for: EGFR  No components found for: CRCL  Current Barriers:  Marland Kitchen Knowledge Deficits related to basic understanding of hypertension diagnosis . Knowledge Deficits related to understanding of medications prescribed for management of hypertension . Knowledge deficit related to self care management of hypertension  Case Manager Clinical Goal(s):  Marland Kitchen Over the next 30 days, patient will verbalize understanding of plan for hypertension management . Over the next 30 days, patient will not experience [name] admission. [name] Admissions in last 6 months = 2 . Over the next 90 days, patient will verbalize basic understanding of hypertension disease process and self health management plan as evidenced by reduction in blood pressure readings.  Interventions:  . Reviewed medications with patient and discussed importance of compliance . Discussed plans with patient for ongoing care management follow up and provided patient with direct contact information for care management team . Advised patient, providing education and rationale, to monitor blood pressure daily and record, calling PCP for findings outside established parameters.  . Provided education regarding s/s of DASH diet/Low salt diet . Provided education regarding complications of uncontrolled blood pressure . Discussed signs and symptoms of hypertension . Provided educational material:  EMMI (Weight Loss Tips, Weight Loss Diet, Chair Exercises, Relaxation Techniques, Low Sodium Diet, DASH Diet, Controlling your Blood Pressure through Lifestyle, High Blood Pressure Health Problems, High Blood Pressure Taking Your Blood Pressure, Lowering Your [place]  of High Blood Pressure, Stress, etc)  Patient Self Care Activities:  . Attends all scheduled provider appointments . Calls provider office for new concerns, questions, or BP  outside discussed parameters . Monitors BP and records as discussed . Adheres to a low sodium diet/DASH diet  Initial goal documentation         [name], RN Care Management Coordinator [place] Office [phone]

## 2020-05-31 ENCOUNTER — Other Ambulatory Visit: Payer: Self-pay

## 2020-05-31 DIAGNOSIS — Z4789 Encounter for other orthopedic aftercare: Secondary | ICD-10-CM | POA: Diagnosis not present

## 2020-05-31 DIAGNOSIS — M50023 Cervical disc disorder at C6-C7 level with myelopathy: Secondary | ICD-10-CM | POA: Diagnosis not present

## 2020-05-31 DIAGNOSIS — G250 Essential tremor: Secondary | ICD-10-CM | POA: Diagnosis not present

## 2020-05-31 DIAGNOSIS — M4712 Other spondylosis with myelopathy, cervical region: Secondary | ICD-10-CM | POA: Diagnosis not present

## 2020-05-31 DIAGNOSIS — M4802 Spinal stenosis, cervical region: Secondary | ICD-10-CM | POA: Diagnosis not present

## 2020-05-31 DIAGNOSIS — M4315 Spondylolisthesis, thoracolumbar region: Secondary | ICD-10-CM | POA: Diagnosis not present

## 2020-05-31 MED ORDER — TRAZODONE HCL 50 MG PO TABS
25.0000 mg | ORAL_TABLET | Freq: Every day | ORAL | 2 refills | Status: DC
Start: 2020-05-31 — End: 2020-06-06

## 2020-05-31 NOTE — Telephone Encounter (Signed)
Sent. Thanks.   

## 2020-05-31 NOTE — Telephone Encounter (Signed)
Patient contacted the office and states he needs a refill on Trazadone. Last refilled 05/17/20 for #30 with 0 refills. Patient states he only has one pill left. Dr. Damita Dunnings, please advise.

## 2020-06-01 DIAGNOSIS — M4802 Spinal stenosis, cervical region: Secondary | ICD-10-CM | POA: Diagnosis not present

## 2020-06-01 DIAGNOSIS — M4712 Other spondylosis with myelopathy, cervical region: Secondary | ICD-10-CM | POA: Diagnosis not present

## 2020-06-01 DIAGNOSIS — M4315 Spondylolisthesis, thoracolumbar region: Secondary | ICD-10-CM | POA: Diagnosis not present

## 2020-06-01 DIAGNOSIS — G250 Essential tremor: Secondary | ICD-10-CM | POA: Diagnosis not present

## 2020-06-01 DIAGNOSIS — Z4789 Encounter for other orthopedic aftercare: Secondary | ICD-10-CM | POA: Diagnosis not present

## 2020-06-01 DIAGNOSIS — M50023 Cervical disc disorder at C6-C7 level with myelopathy: Secondary | ICD-10-CM | POA: Diagnosis not present

## 2020-06-02 DIAGNOSIS — G250 Essential tremor: Secondary | ICD-10-CM | POA: Diagnosis not present

## 2020-06-02 DIAGNOSIS — M4802 Spinal stenosis, cervical region: Secondary | ICD-10-CM | POA: Diagnosis not present

## 2020-06-02 DIAGNOSIS — M4315 Spondylolisthesis, thoracolumbar region: Secondary | ICD-10-CM | POA: Diagnosis not present

## 2020-06-02 DIAGNOSIS — M4712 Other spondylosis with myelopathy, cervical region: Secondary | ICD-10-CM | POA: Diagnosis not present

## 2020-06-02 DIAGNOSIS — M50023 Cervical disc disorder at C6-C7 level with myelopathy: Secondary | ICD-10-CM | POA: Diagnosis not present

## 2020-06-02 DIAGNOSIS — Z4789 Encounter for other orthopedic aftercare: Secondary | ICD-10-CM | POA: Diagnosis not present

## 2020-06-05 DIAGNOSIS — M50023 Cervical disc disorder at C6-C7 level with myelopathy: Secondary | ICD-10-CM | POA: Diagnosis not present

## 2020-06-05 DIAGNOSIS — M4712 Other spondylosis with myelopathy, cervical region: Secondary | ICD-10-CM | POA: Diagnosis not present

## 2020-06-05 DIAGNOSIS — G250 Essential tremor: Secondary | ICD-10-CM | POA: Diagnosis not present

## 2020-06-05 DIAGNOSIS — M4802 Spinal stenosis, cervical region: Secondary | ICD-10-CM | POA: Diagnosis not present

## 2020-06-05 DIAGNOSIS — M4315 Spondylolisthesis, thoracolumbar region: Secondary | ICD-10-CM | POA: Diagnosis not present

## 2020-06-05 DIAGNOSIS — Z4789 Encounter for other orthopedic aftercare: Secondary | ICD-10-CM | POA: Diagnosis not present

## 2020-06-06 ENCOUNTER — Encounter: Payer: Self-pay | Admitting: Family Medicine

## 2020-06-06 ENCOUNTER — Other Ambulatory Visit: Payer: Self-pay

## 2020-06-06 ENCOUNTER — Ambulatory Visit (INDEPENDENT_AMBULATORY_CARE_PROVIDER_SITE_OTHER): Payer: Medicare Other | Admitting: Family Medicine

## 2020-06-06 VITALS — BP 124/62 | HR 78 | Temp 96.8°F | Ht 71.0 in | Wt 175.1 lb

## 2020-06-06 DIAGNOSIS — M4802 Spinal stenosis, cervical region: Secondary | ICD-10-CM

## 2020-06-06 DIAGNOSIS — G992 Myelopathy in diseases classified elsewhere: Secondary | ICD-10-CM | POA: Diagnosis not present

## 2020-06-06 DIAGNOSIS — D649 Anemia, unspecified: Secondary | ICD-10-CM | POA: Diagnosis not present

## 2020-06-06 DIAGNOSIS — N179 Acute kidney failure, unspecified: Secondary | ICD-10-CM | POA: Diagnosis not present

## 2020-06-06 DIAGNOSIS — M50023 Cervical disc disorder at C6-C7 level with myelopathy: Secondary | ICD-10-CM | POA: Diagnosis not present

## 2020-06-06 DIAGNOSIS — I10 Essential (primary) hypertension: Secondary | ICD-10-CM | POA: Diagnosis not present

## 2020-06-06 DIAGNOSIS — G4700 Insomnia, unspecified: Secondary | ICD-10-CM | POA: Diagnosis not present

## 2020-06-06 DIAGNOSIS — G250 Essential tremor: Secondary | ICD-10-CM | POA: Diagnosis not present

## 2020-06-06 DIAGNOSIS — M4712 Other spondylosis with myelopathy, cervical region: Secondary | ICD-10-CM | POA: Diagnosis not present

## 2020-06-06 DIAGNOSIS — Z23 Encounter for immunization: Secondary | ICD-10-CM

## 2020-06-06 DIAGNOSIS — Z4789 Encounter for other orthopedic aftercare: Secondary | ICD-10-CM | POA: Diagnosis not present

## 2020-06-06 DIAGNOSIS — I25810 Atherosclerosis of coronary artery bypass graft(s) without angina pectoris: Secondary | ICD-10-CM

## 2020-06-06 DIAGNOSIS — M4315 Spondylolisthesis, thoracolumbar region: Secondary | ICD-10-CM | POA: Diagnosis not present

## 2020-06-06 MED ORDER — ACETAMINOPHEN 500 MG PO TABS: 1000.0000 mg | ORAL_TABLET | Freq: Three times a day (TID) | ORAL | Status: DC | PRN

## 2020-06-06 MED ORDER — TRAZODONE HCL 50 MG PO TABS
50.0000 mg | ORAL_TABLET | Freq: Every day | ORAL | 1 refills | Status: DC
Start: 2020-06-06 — End: 2020-10-06

## 2020-06-06 NOTE — Patient Instructions (Signed)
Keep using tylenol and ice/heat for pain.  Update me as needed.   Go to the lab on the way out.   If you have mychart we'll likely use that to update you.    Keep working with PT and OT.  Take care.  Glad to see you.

## 2020-06-06 NOTE — Progress Notes (Signed)
This visit occurred during the SARS-CoV-2 public health emergency.  Safety protocols were in place, including screening questions prior to the visit, additional usage of staff PPE, and extensive cleaning of exam room while observing appropriate contact time as indicated for disinfecting solutions.  Flu shot today.    4/10 pain.  Posterior neck pain.  He had constipation with hydrocodone.  D/w pt about options.  D/w pt about not using aleve.  Taking tylenol for pain, with some relief.  D/w pt about using heat and ice as needed.  He still has some tingling in the left hand but he has normal left hand grip.  He still has some nonacute weakness with flexion of the left elbow.  No ADE on tamsulosin, he can tolerate med.  D/w pt.    No falls, using a walker.  Has a shower chair.  He has trouble with washing but has some help with an aide.  He is doing PT and OT.    We talked about his mood.  He'll update me as needed.  No SI.  We talked about grief symptoms.  He wants to get better.  Still okay for outpatient follow-up  Insomnia better with trazodone, 50mg  at night.  rx sent.  25mg  didn't work.    History of AKI.  See notes on follow-up labs. History of anemia.  See notes on follow-up labs.  Meds, vitals, and allergies reviewed.   ROS: Per HPI unless specifically indicated in ROS section   nad ncat Neck supple, no LA rrr ctab abd soft. Not ttp.  Ext well perfused He still has some tingling in the left hand but he has normal left hand grip.  He still has some nonacute weakness with flexion of the left elbow.  At least 30 minutes were devoted to patient care in this encounter (this can potentially include time spent reviewing the patient's file/history, interviewing and examining the patient, counseling/reviewing plan with patient, ordering referrals, ordering tests, reviewing relevant laboratory or x-ray data, and documenting the encounter).

## 2020-06-07 DIAGNOSIS — G250 Essential tremor: Secondary | ICD-10-CM | POA: Diagnosis not present

## 2020-06-07 DIAGNOSIS — M4315 Spondylolisthesis, thoracolumbar region: Secondary | ICD-10-CM | POA: Diagnosis not present

## 2020-06-07 DIAGNOSIS — M4712 Other spondylosis with myelopathy, cervical region: Secondary | ICD-10-CM | POA: Diagnosis not present

## 2020-06-07 DIAGNOSIS — M4802 Spinal stenosis, cervical region: Secondary | ICD-10-CM | POA: Diagnosis not present

## 2020-06-07 DIAGNOSIS — Z4789 Encounter for other orthopedic aftercare: Secondary | ICD-10-CM | POA: Diagnosis not present

## 2020-06-07 DIAGNOSIS — M50023 Cervical disc disorder at C6-C7 level with myelopathy: Secondary | ICD-10-CM | POA: Diagnosis not present

## 2020-06-07 LAB — BASIC METABOLIC PANEL
BUN: 35 mg/dL — ABNORMAL HIGH (ref 6–23)
CO2: 26 mEq/L (ref 19–32)
Calcium: 9.9 mg/dL (ref 8.4–10.5)
Chloride: 103 mEq/L (ref 96–112)
Creatinine, Ser: 1.28 mg/dL (ref 0.40–1.50)
GFR: 52.87 mL/min — ABNORMAL LOW (ref >60.00)
Glucose, Bld: 130 mg/dL — ABNORMAL HIGH (ref 70–99)
Potassium: 4.7 mEq/L (ref 3.5–5.1)
Sodium: 136 mEq/L (ref 135–145)

## 2020-06-07 LAB — CBC WITH DIFFERENTIAL/PLATELET
Basophils Absolute: 0.1 10*3/uL (ref 0.0–0.1)
Basophils Relative: 1 % (ref 0.0–3.0)
Eosinophils Absolute: 0.1 10*3/uL (ref 0.0–0.7)
Eosinophils Relative: 2.3 % (ref 0.0–5.0)
HCT: 35.9 % — ABNORMAL LOW (ref 39.0–52.0)
Hemoglobin: 12.1 g/dL — ABNORMAL LOW (ref 13.0–17.0)
Lymphocytes Relative: 31.6 % (ref 12.0–46.0)
Lymphs Abs: 1.7 10*3/uL (ref 0.7–4.0)
MCHC: 33.8 g/dL (ref 30.0–36.0)
MCV: 98.8 fl (ref 78.0–100.0)
Monocytes Absolute: 0.7 10*3/uL (ref 0.1–1.0)
Monocytes Relative: 13.8 % — ABNORMAL HIGH (ref 3.0–12.0)
Neutro Abs: 2.8 10*3/uL (ref 1.4–7.7)
Neutrophils Relative %: 51.3 % (ref 43.0–77.0)
Platelets: 220 10*3/uL (ref 150.0–400.0)
RBC: 3.63 Mil/uL — ABNORMAL LOW (ref 4.22–5.81)
RDW: 17.7 % — ABNORMAL HIGH (ref 11.5–15.5)
WBC: 5.4 10*3/uL (ref 4.0–10.5)

## 2020-06-08 DIAGNOSIS — E46 Unspecified protein-calorie malnutrition: Secondary | ICD-10-CM | POA: Diagnosis not present

## 2020-06-08 DIAGNOSIS — Z79899 Other long term (current) drug therapy: Secondary | ICD-10-CM

## 2020-06-08 DIAGNOSIS — Z8616 Personal history of COVID-19: Secondary | ICD-10-CM

## 2020-06-08 DIAGNOSIS — Z4789 Encounter for other orthopedic aftercare: Secondary | ICD-10-CM | POA: Diagnosis not present

## 2020-06-08 DIAGNOSIS — E119 Type 2 diabetes mellitus without complications: Secondary | ICD-10-CM | POA: Diagnosis not present

## 2020-06-08 DIAGNOSIS — Z8546 Personal history of malignant neoplasm of prostate: Secondary | ICD-10-CM

## 2020-06-08 DIAGNOSIS — M4315 Spondylolisthesis, thoracolumbar region: Secondary | ICD-10-CM | POA: Diagnosis not present

## 2020-06-08 DIAGNOSIS — N400 Enlarged prostate without lower urinary tract symptoms: Secondary | ICD-10-CM

## 2020-06-08 DIAGNOSIS — K5903 Drug induced constipation: Secondary | ICD-10-CM

## 2020-06-08 DIAGNOSIS — E785 Hyperlipidemia, unspecified: Secondary | ICD-10-CM

## 2020-06-08 DIAGNOSIS — Z981 Arthrodesis status: Secondary | ICD-10-CM

## 2020-06-08 DIAGNOSIS — G250 Essential tremor: Secondary | ICD-10-CM | POA: Diagnosis not present

## 2020-06-08 DIAGNOSIS — Z86010 Personal history of colonic polyps: Secondary | ICD-10-CM

## 2020-06-08 DIAGNOSIS — I2510 Atherosclerotic heart disease of native coronary artery without angina pectoris: Secondary | ICD-10-CM | POA: Diagnosis not present

## 2020-06-08 DIAGNOSIS — Z7982 Long term (current) use of aspirin: Secondary | ICD-10-CM

## 2020-06-08 DIAGNOSIS — I10 Essential (primary) hypertension: Secondary | ICD-10-CM | POA: Diagnosis not present

## 2020-06-08 DIAGNOSIS — M4802 Spinal stenosis, cervical region: Secondary | ICD-10-CM | POA: Diagnosis not present

## 2020-06-08 DIAGNOSIS — D649 Anemia, unspecified: Secondary | ICD-10-CM

## 2020-06-08 DIAGNOSIS — M2578 Osteophyte, vertebrae: Secondary | ICD-10-CM | POA: Diagnosis not present

## 2020-06-08 DIAGNOSIS — N179 Acute kidney failure, unspecified: Secondary | ICD-10-CM | POA: Diagnosis not present

## 2020-06-08 DIAGNOSIS — K219 Gastro-esophageal reflux disease without esophagitis: Secondary | ICD-10-CM

## 2020-06-08 DIAGNOSIS — Z9181 History of falling: Secondary | ICD-10-CM

## 2020-06-08 DIAGNOSIS — Z951 Presence of aortocoronary bypass graft: Secondary | ICD-10-CM

## 2020-06-08 DIAGNOSIS — M50023 Cervical disc disorder at C6-C7 level with myelopathy: Secondary | ICD-10-CM | POA: Diagnosis not present

## 2020-06-08 DIAGNOSIS — H9190 Unspecified hearing loss, unspecified ear: Secondary | ICD-10-CM

## 2020-06-08 DIAGNOSIS — I4510 Unspecified right bundle-branch block: Secondary | ICD-10-CM

## 2020-06-08 DIAGNOSIS — M4712 Other spondylosis with myelopathy, cervical region: Secondary | ICD-10-CM | POA: Diagnosis not present

## 2020-06-08 DIAGNOSIS — M109 Gout, unspecified: Secondary | ICD-10-CM

## 2020-06-12 DIAGNOSIS — M4315 Spondylolisthesis, thoracolumbar region: Secondary | ICD-10-CM | POA: Diagnosis not present

## 2020-06-12 DIAGNOSIS — G4700 Insomnia, unspecified: Secondary | ICD-10-CM | POA: Insufficient documentation

## 2020-06-12 DIAGNOSIS — Z4789 Encounter for other orthopedic aftercare: Secondary | ICD-10-CM | POA: Diagnosis not present

## 2020-06-12 DIAGNOSIS — G250 Essential tremor: Secondary | ICD-10-CM | POA: Diagnosis not present

## 2020-06-12 DIAGNOSIS — M4802 Spinal stenosis, cervical region: Secondary | ICD-10-CM | POA: Diagnosis not present

## 2020-06-12 DIAGNOSIS — M4712 Other spondylosis with myelopathy, cervical region: Secondary | ICD-10-CM | POA: Diagnosis not present

## 2020-06-12 DIAGNOSIS — M50023 Cervical disc disorder at C6-C7 level with myelopathy: Secondary | ICD-10-CM | POA: Diagnosis not present

## 2020-06-12 NOTE — Assessment & Plan Note (Signed)
Status post surgery and he will continue with PT and OT.  We talked about pain management, see above.  He is making some progress and he will update me as needed.

## 2020-06-12 NOTE — Assessment & Plan Note (Signed)
See notes on follow-up labs. 

## 2020-06-12 NOTE — Assessment & Plan Note (Signed)
Okay to continue trazodone at 50 mg per night.  He will update me as needed.

## 2020-06-13 ENCOUNTER — Other Ambulatory Visit: Payer: Self-pay | Admitting: *Deleted

## 2020-06-13 DIAGNOSIS — G250 Essential tremor: Secondary | ICD-10-CM | POA: Diagnosis not present

## 2020-06-13 DIAGNOSIS — M4712 Other spondylosis with myelopathy, cervical region: Secondary | ICD-10-CM | POA: Diagnosis not present

## 2020-06-13 DIAGNOSIS — M4802 Spinal stenosis, cervical region: Secondary | ICD-10-CM | POA: Diagnosis not present

## 2020-06-13 DIAGNOSIS — M4315 Spondylolisthesis, thoracolumbar region: Secondary | ICD-10-CM | POA: Diagnosis not present

## 2020-06-13 DIAGNOSIS — Z4789 Encounter for other orthopedic aftercare: Secondary | ICD-10-CM | POA: Diagnosis not present

## 2020-06-13 DIAGNOSIS — M50023 Cervical disc disorder at C6-C7 level with myelopathy: Secondary | ICD-10-CM | POA: Diagnosis not present

## 2020-06-13 NOTE — Patient Outreach (Signed)
[facility]) Care Management  06/13/2020  [name] [name] 10-Aug-1929 [id number]   Telephone Assessment-Successful-HTN  RN spoke with pt and verified he continue to manage his HTN. States [facility] continue to complete home visit and takes the pt's BP on the visit weekly. Reports blood pressures ranges from 130-140/70.  Pt is aware [facility] is temporary so he would need to continue checking his blood pressures readings for ongoing self management of care. Good reports from primary provider follow up appointment last week's office visit. Plan of care reviewed and discussed with goals and interventions.  Pt reports additional hired help in the home for a sitter several days a week. No acute issues reported as pt continues to manage his care based upon the information discussed today and printer material mailed to pt several weeks ago.   Will follow up next month as discussed today for ongoing case management services related to pt's HTN. Will continue communicate with pt's provider accordingly.    Goals Addressed              This Visit's Progress   .  COMPLETED: THN ("get better") (pt-stated)        CARE PLAN ENTRY (see longitudinal plan of care for additional care plan information)  Objective:  . Last practice recorded BP readings:  BP Readings from Last 3 Encounters:  05/25/20 (!) 161/86  05/17/20 117/68  05/04/20 (!) 155/69 .   Marland Kitchen Most recent eGFR/CrCl: No results found for: EGFR  No components found for: CRCL  Current Barriers:  Marland Kitchen Knowledge Deficits related to basic understanding of hypertension diagnosis . Knowledge Deficits related to understanding of medications prescribed for management of hypertension . Knowledge deficit related to self care management of hypertension  Case Manager Clinical Goal(s):  Marland Kitchen Over the next 30 days, patient will verbalize understanding of plan for hypertension management . Over the next 30 days, patient will not experience  hospital admission. Hospital Admissions in last 6 months = 2 . Over the next 90 days, patient will verbalize basic understanding of hypertension disease process and self health management plan as evidenced by reduction in blood pressure readings.  Interventions:  . Reviewed medications with patient and discussed importance of compliance . Discussed plans with patient for ongoing care management follow up and provided patient with direct contact information for care management team . Advised patient, providing education and rationale, to monitor blood pressure daily and record, calling PCP for findings outside established parameters.  . Provided education regarding s/s of DASH diet/Low salt diet . Provided education regarding complications of uncontrolled blood pressure . Discussed signs and symptoms of hypertension . Provided educational material:  EMMI (Weight Loss Tips, Weight Loss Diet, Chair Exercises, Relaxation Techniques, Low Sodium Diet, DASH Diet, Controlling your Blood Pressure through Lifestyle, High Blood Pressure Health Problems, High Blood Pressure Taking Your Blood Pressure, Lowering Your [place] of High Blood Pressure, Stress, etc)  Patient Self Care Activities:  . Attends all scheduled provider appointments . Calls provider office for new concerns, questions, or BP outside discussed parameters . Monitors BP and records as discussed . Adheres to a low sodium diet/DASH diet  Initial goal documentation  Resolving due to duplicate goals.    Baker Pierini and Manage My Blood Pressure        Follow Up Date 07/14/2020   - check blood pressure weekly - choose a place to take my blood pressure (home, clinic or office, retail store) - write blood pressure results in  a log or diary    Why is this important?   You won't feel high blood pressure, but it can still hurt your blood vessels.  High blood pressure can cause heart or kidney problems. It can also cause a stroke.  Making  lifestyle changes like losing a little weight or eating less salt will help.  Checking your blood pressure at home and at different times of the day can help to control blood pressure.  If the doctor prescribes medicine remember to take it the way the doctor ordered.  Call the office if you cannot afford the medicine or if there are questions about it.     Notes:        [name], RN Care Management Coordinator [place] Office [phone]

## 2020-06-15 DIAGNOSIS — M4802 Spinal stenosis, cervical region: Secondary | ICD-10-CM | POA: Diagnosis not present

## 2020-06-15 DIAGNOSIS — G250 Essential tremor: Secondary | ICD-10-CM | POA: Diagnosis not present

## 2020-06-15 DIAGNOSIS — Z4789 Encounter for other orthopedic aftercare: Secondary | ICD-10-CM | POA: Diagnosis not present

## 2020-06-15 DIAGNOSIS — M4712 Other spondylosis with myelopathy, cervical region: Secondary | ICD-10-CM | POA: Diagnosis not present

## 2020-06-15 DIAGNOSIS — M50023 Cervical disc disorder at C6-C7 level with myelopathy: Secondary | ICD-10-CM | POA: Diagnosis not present

## 2020-06-15 DIAGNOSIS — M4315 Spondylolisthesis, thoracolumbar region: Secondary | ICD-10-CM | POA: Diagnosis not present

## 2020-06-18 DIAGNOSIS — K219 Gastro-esophageal reflux disease without esophagitis: Secondary | ICD-10-CM | POA: Diagnosis not present

## 2020-06-18 DIAGNOSIS — Z79899 Other long term (current) drug therapy: Secondary | ICD-10-CM | POA: Diagnosis not present

## 2020-06-18 DIAGNOSIS — Z981 Arthrodesis status: Secondary | ICD-10-CM | POA: Diagnosis not present

## 2020-06-18 DIAGNOSIS — H9190 Unspecified hearing loss, unspecified ear: Secondary | ICD-10-CM | POA: Diagnosis not present

## 2020-06-18 DIAGNOSIS — Z8546 Personal history of malignant neoplasm of prostate: Secondary | ICD-10-CM | POA: Diagnosis not present

## 2020-06-18 DIAGNOSIS — G250 Essential tremor: Secondary | ICD-10-CM | POA: Diagnosis not present

## 2020-06-18 DIAGNOSIS — D649 Anemia, unspecified: Secondary | ICD-10-CM | POA: Diagnosis not present

## 2020-06-18 DIAGNOSIS — I10 Essential (primary) hypertension: Secondary | ICD-10-CM | POA: Diagnosis not present

## 2020-06-18 DIAGNOSIS — E46 Unspecified protein-calorie malnutrition: Secondary | ICD-10-CM | POA: Diagnosis not present

## 2020-06-18 DIAGNOSIS — M109 Gout, unspecified: Secondary | ICD-10-CM | POA: Diagnosis not present

## 2020-06-18 DIAGNOSIS — M50023 Cervical disc disorder at C6-C7 level with myelopathy: Secondary | ICD-10-CM | POA: Diagnosis not present

## 2020-06-18 DIAGNOSIS — N179 Acute kidney failure, unspecified: Secondary | ICD-10-CM | POA: Diagnosis not present

## 2020-06-18 DIAGNOSIS — Z4789 Encounter for other orthopedic aftercare: Secondary | ICD-10-CM | POA: Diagnosis not present

## 2020-06-18 DIAGNOSIS — M4712 Other spondylosis with myelopathy, cervical region: Secondary | ICD-10-CM | POA: Diagnosis not present

## 2020-06-18 DIAGNOSIS — M4802 Spinal stenosis, cervical region: Secondary | ICD-10-CM | POA: Diagnosis not present

## 2020-06-18 DIAGNOSIS — Z8616 Personal history of COVID-19: Secondary | ICD-10-CM | POA: Diagnosis not present

## 2020-06-18 DIAGNOSIS — N400 Enlarged prostate without lower urinary tract symptoms: Secondary | ICD-10-CM | POA: Diagnosis not present

## 2020-06-18 DIAGNOSIS — I4510 Unspecified right bundle-branch block: Secondary | ICD-10-CM | POA: Diagnosis not present

## 2020-06-18 DIAGNOSIS — E119 Type 2 diabetes mellitus without complications: Secondary | ICD-10-CM | POA: Diagnosis not present

## 2020-06-18 DIAGNOSIS — M2578 Osteophyte, vertebrae: Secondary | ICD-10-CM | POA: Diagnosis not present

## 2020-06-18 DIAGNOSIS — K5903 Drug induced constipation: Secondary | ICD-10-CM | POA: Diagnosis not present

## 2020-06-18 DIAGNOSIS — M4315 Spondylolisthesis, thoracolumbar region: Secondary | ICD-10-CM | POA: Diagnosis not present

## 2020-06-18 DIAGNOSIS — Z7982 Long term (current) use of aspirin: Secondary | ICD-10-CM | POA: Diagnosis not present

## 2020-06-18 DIAGNOSIS — I2510 Atherosclerotic heart disease of native coronary artery without angina pectoris: Secondary | ICD-10-CM | POA: Diagnosis not present

## 2020-06-18 DIAGNOSIS — E785 Hyperlipidemia, unspecified: Secondary | ICD-10-CM | POA: Diagnosis not present

## 2020-06-19 DIAGNOSIS — M4802 Spinal stenosis, cervical region: Secondary | ICD-10-CM | POA: Diagnosis not present

## 2020-06-19 DIAGNOSIS — M50023 Cervical disc disorder at C6-C7 level with myelopathy: Secondary | ICD-10-CM | POA: Diagnosis not present

## 2020-06-19 DIAGNOSIS — Z4789 Encounter for other orthopedic aftercare: Secondary | ICD-10-CM | POA: Diagnosis not present

## 2020-06-19 DIAGNOSIS — G250 Essential tremor: Secondary | ICD-10-CM | POA: Diagnosis not present

## 2020-06-19 DIAGNOSIS — M4315 Spondylolisthesis, thoracolumbar region: Secondary | ICD-10-CM | POA: Diagnosis not present

## 2020-06-19 DIAGNOSIS — M4712 Other spondylosis with myelopathy, cervical region: Secondary | ICD-10-CM | POA: Diagnosis not present

## 2020-06-20 ENCOUNTER — Telehealth: Payer: Self-pay | Admitting: *Deleted

## 2020-06-20 NOTE — Telephone Encounter (Signed)
Sharyn Lull OT called to extend OT 1wk3 beginning next week.  Approval given.

## 2020-06-21 DIAGNOSIS — M4315 Spondylolisthesis, thoracolumbar region: Secondary | ICD-10-CM | POA: Diagnosis not present

## 2020-06-21 DIAGNOSIS — Z4789 Encounter for other orthopedic aftercare: Secondary | ICD-10-CM | POA: Diagnosis not present

## 2020-06-21 DIAGNOSIS — M4712 Other spondylosis with myelopathy, cervical region: Secondary | ICD-10-CM | POA: Diagnosis not present

## 2020-06-21 DIAGNOSIS — M4802 Spinal stenosis, cervical region: Secondary | ICD-10-CM | POA: Diagnosis not present

## 2020-06-21 DIAGNOSIS — G250 Essential tremor: Secondary | ICD-10-CM | POA: Diagnosis not present

## 2020-06-21 DIAGNOSIS — M50023 Cervical disc disorder at C6-C7 level with myelopathy: Secondary | ICD-10-CM | POA: Diagnosis not present

## 2020-06-22 DIAGNOSIS — M4802 Spinal stenosis, cervical region: Secondary | ICD-10-CM | POA: Diagnosis not present

## 2020-06-26 DIAGNOSIS — Z4789 Encounter for other orthopedic aftercare: Secondary | ICD-10-CM | POA: Diagnosis not present

## 2020-06-26 DIAGNOSIS — M4802 Spinal stenosis, cervical region: Secondary | ICD-10-CM | POA: Diagnosis not present

## 2020-06-26 DIAGNOSIS — G250 Essential tremor: Secondary | ICD-10-CM | POA: Diagnosis not present

## 2020-06-26 DIAGNOSIS — M4712 Other spondylosis with myelopathy, cervical region: Secondary | ICD-10-CM | POA: Diagnosis not present

## 2020-06-26 DIAGNOSIS — M4315 Spondylolisthesis, thoracolumbar region: Secondary | ICD-10-CM | POA: Diagnosis not present

## 2020-06-26 DIAGNOSIS — M50023 Cervical disc disorder at C6-C7 level with myelopathy: Secondary | ICD-10-CM | POA: Diagnosis not present

## 2020-06-27 DIAGNOSIS — M4802 Spinal stenosis, cervical region: Secondary | ICD-10-CM | POA: Diagnosis not present

## 2020-06-27 DIAGNOSIS — M4315 Spondylolisthesis, thoracolumbar region: Secondary | ICD-10-CM | POA: Diagnosis not present

## 2020-06-27 DIAGNOSIS — M4712 Other spondylosis with myelopathy, cervical region: Secondary | ICD-10-CM | POA: Diagnosis not present

## 2020-06-27 DIAGNOSIS — G250 Essential tremor: Secondary | ICD-10-CM | POA: Diagnosis not present

## 2020-06-27 DIAGNOSIS — Z4789 Encounter for other orthopedic aftercare: Secondary | ICD-10-CM | POA: Diagnosis not present

## 2020-06-27 DIAGNOSIS — M50023 Cervical disc disorder at C6-C7 level with myelopathy: Secondary | ICD-10-CM | POA: Diagnosis not present

## 2020-07-05 DIAGNOSIS — M50023 Cervical disc disorder at C6-C7 level with myelopathy: Secondary | ICD-10-CM | POA: Diagnosis not present

## 2020-07-05 DIAGNOSIS — Z4789 Encounter for other orthopedic aftercare: Secondary | ICD-10-CM | POA: Diagnosis not present

## 2020-07-05 DIAGNOSIS — M4802 Spinal stenosis, cervical region: Secondary | ICD-10-CM | POA: Diagnosis not present

## 2020-07-05 DIAGNOSIS — G250 Essential tremor: Secondary | ICD-10-CM | POA: Diagnosis not present

## 2020-07-05 DIAGNOSIS — M4315 Spondylolisthesis, thoracolumbar region: Secondary | ICD-10-CM | POA: Diagnosis not present

## 2020-07-05 DIAGNOSIS — M4712 Other spondylosis with myelopathy, cervical region: Secondary | ICD-10-CM | POA: Diagnosis not present

## 2020-07-06 ENCOUNTER — Encounter: Payer: Self-pay | Admitting: *Deleted

## 2020-07-06 NOTE — Telephone Encounter (Signed)
This encounter was created in error - please disregard.

## 2020-07-10 ENCOUNTER — Encounter: Payer: Medicare Other | Attending: Registered Nurse | Admitting: Physical Medicine & Rehabilitation

## 2020-07-10 ENCOUNTER — Encounter: Payer: Self-pay | Admitting: Physical Medicine & Rehabilitation

## 2020-07-10 ENCOUNTER — Other Ambulatory Visit: Payer: Self-pay

## 2020-07-10 VITALS — BP 147/83 | HR 71 | Temp 97.9°F | Ht 70.5 in | Wt 175.0 lb

## 2020-07-10 DIAGNOSIS — K5900 Constipation, unspecified: Secondary | ICD-10-CM | POA: Insufficient documentation

## 2020-07-10 DIAGNOSIS — I25810 Atherosclerosis of coronary artery bypass graft(s) without angina pectoris: Secondary | ICD-10-CM

## 2020-07-10 DIAGNOSIS — Z981 Arthrodesis status: Secondary | ICD-10-CM | POA: Diagnosis not present

## 2020-07-10 DIAGNOSIS — R269 Unspecified abnormalities of gait and mobility: Secondary | ICD-10-CM

## 2020-07-10 DIAGNOSIS — G479 Sleep disorder, unspecified: Secondary | ICD-10-CM

## 2020-07-10 DIAGNOSIS — I10 Essential (primary) hypertension: Secondary | ICD-10-CM | POA: Diagnosis not present

## 2020-07-10 DIAGNOSIS — K5901 Slow transit constipation: Secondary | ICD-10-CM

## 2020-07-10 MED ORDER — METHOCARBAMOL 500 MG PO TABS: 500.0000 mg | ORAL_TABLET | Freq: Two times a day (BID) | ORAL | 1 refills | Status: DC | PRN

## 2020-07-10 NOTE — Progress Notes (Addendum)
Subjective:    Patient ID: [name] [name], male    DOB: [birth year], 86 y.o.   MRN: [id number]  HPI Male with history of GERD, essential tremor, COVID-19 02/2020, gait disturbance with numbness bilateral hands due to HNP spondylolysis and severe canal stenosis C3-C6 presents for follow up after C3-C6 posterior fusion.  Last clinic visit on 05/25/20 with NP, notes reviewed. Daughter supplements history. At discharge, she was instructed to follow up with Neurosurg.  Denies falls. He is limiting his lifting. PCP ordered lab work.  Pain is relatively controlled at site of incision.  Tylenol provides some benefit. BP is mildly elevated, but states WNL at home. Bowel movements have improved. Wheezing has resolved. Sleep is poor.   Therapies: Completed Mobility: Walker at all times DME: Bedside commode  Pain Inventory Average Pain 4 Pain Right Now 3 My pain is constant and dull  In the last 24 hours, has pain interfered with the following? General activity 7 Relation with others 7 Enjoyment of life 7 What TIME of day is your pain at its worst? daytime Sleep (in general) Poor  Pain is worse with: sitting and standing Pain improves with: rest Relief from Meds: 1  Family History  Problem Relation Age of Onset  . Heart disease Mother   . Cancer Mother   . Cancer Sister   . Diabetes Sister   . Cancer Brother        prostate  . Prostate cancer Brother   . Hypothyroidism Brother   . Depression Neg Hx   . Alcohol abuse Neg Hx   . Drug abuse Neg Hx   . Stroke Neg Hx   . Colon cancer Neg Hx    Social History   Socioeconomic History  . Marital status: Married    Spouse name: Not on file  . Number of children: 1  . Years of education: Not on file  . Highest education level: Not on file  Occupational History  . Occupation: Retired Pharmacist, community  Tobacco Use  . Smoking status: Never Smoker  . Smokeless tobacco: Never Used  Vaping Use  . Vaping Use: Never used    Substance and Sexual Activity  . Alcohol use: No  . Drug use: No  . Sexual activity: Never  Other Topics Concern  . Not on file  Social History Narrative   Widowed 2020 after 88+ years of marriage, lives with daughter.     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 [employer]   Agent orange exposure.  Sig service related noise exposure.     Right handed   Social Determinants of Health   Financial Resource Strain:   . Difficulty of Paying Living Expenses: Not on file  Food Insecurity: No Food Insecurity  . Worried About Charity fundraiser in the Last Year: Never true  . Ran Out of Food in the Last Year: Never true  Transportation Needs: No Transportation Needs  . Lack of Transportation (Medical): No  . Lack of Transportation (Non-Medical): No  Physical Activity:   . Days of Exercise per Week: Not on file  . Minutes of Exercise per Session: Not on file  Stress:   . Feeling of Stress : Not on file  Social Connections:   . Frequency of Communication with Friends and Family: Not on file  . Frequency of Social Gatherings with Friends and Family: Not on file  . Attends Religious Services: Not on file  . Active Member of Clubs or Organizations:  Not on file  . Attends Archivist Meetings: Not on file  . Marital Status: Not on file   Past Surgical History:  Procedure Laterality Date  . Adenosine myoview  01/24/2006   Ischemia by EKG, Cath  . CATARACT EXTRACTION W/ INTRAOCULAR LENS IMPLANT Left 10/2013  . CHOLECYSTECTOMY N/A 12/06/2013   Procedure: Diagnostic Laparoscopy for  drainage Intrabdominal abcess;  Surgeon: Rolm Bookbinder, MD;  Location: [place];  Service: General;  Laterality: N/A;  . CHOLECYSTECTOMY N/A 03/17/2014   Procedure: LAPAROSCOPIC CHOLECYSTECTOMY ;  Surgeon: Rolm Bookbinder, MD;  Location: [place];  Service: General;  Laterality: N/A;  . COLONOSCOPY    . CORONARY ARTERY BYPASS GRAFT  2007   CABGX 4  . CYSTOSCOPY  02/15/2004   Mod BPH, moder tribec ?  Marland Kitchen ERCP N/A  12/06/2013   Procedure: ENDOSCOPIC RETROGRADE CHOLANGIOPANCREATOGRAPHY (ERCP);  Surgeon: Milus Banister, MD;  Location: [place];  Service: Endoscopy;  Laterality: N/A;  . LAPAROSCOPIC CHOLECYSTECTOMY  03/17/2014  . LUMBAR FUSION  02/2012   lumbar spine for spinal stenosis  . POSTERIOR CERVICAL FUSION/FORAMINOTOMY N/A 05/01/2020   Procedure: Posterior cervical fusion with lateral mass fixation - Cervical three - Cervical six, laminectomy Cervical three-six;  Surgeon: Eustace Moore, MD;  Location: [place];  Service: Neurosurgery;  Laterality: N/A;  . PROSTATE CRYOABLATION  09/18/2005   prostate CA   Past Surgical History:  Procedure Laterality Date  . Adenosine myoview  01/24/2006   Ischemia by EKG, Cath  . CATARACT EXTRACTION W/ INTRAOCULAR LENS IMPLANT Left 10/2013  . CHOLECYSTECTOMY N/A 12/06/2013   Procedure: Diagnostic Laparoscopy for  drainage Intrabdominal abcess;  Surgeon: Rolm Bookbinder, MD;  Location: [place];  Service: General;  Laterality: N/A;  . CHOLECYSTECTOMY N/A 03/17/2014   Procedure: LAPAROSCOPIC CHOLECYSTECTOMY ;  Surgeon: Rolm Bookbinder, MD;  Location: [place];  Service: General;  Laterality: N/A;  . COLONOSCOPY    . CORONARY ARTERY BYPASS GRAFT  2007   CABGX 4  . CYSTOSCOPY  02/15/2004   Mod BPH, moder tribec ?  Marland Kitchen ERCP N/A 12/06/2013   Procedure: ENDOSCOPIC RETROGRADE CHOLANGIOPANCREATOGRAPHY (ERCP);  Surgeon: Milus Banister, MD;  Location: [place];  Service: Endoscopy;  Laterality: N/A;  . LAPAROSCOPIC CHOLECYSTECTOMY  03/17/2014  . LUMBAR FUSION  02/2012   lumbar spine for spinal stenosis  . POSTERIOR CERVICAL FUSION/FORAMINOTOMY N/A 05/01/2020   Procedure: Posterior cervical fusion with lateral mass fixation - Cervical three - Cervical six, laminectomy Cervical three-six;  Surgeon: Eustace Moore, MD;  Location: [place];  Service: Neurosurgery;  Laterality: N/A;  . PROSTATE CRYOABLATION  09/18/2005   prostate CA   Past Medical History:  Diagnosis Date  . Acute renal failure  ([place]) 12/06/2013  . Back pain    occasionally  . Benign essential tremor 2005   takes Primidone daily  . Choledocholithiasis 12/06/2013  . Constipation    takes OTC stool softener  . Coronary artery disease 2007   s/p CABG, DR Johnsie Cancel  . GERD (gastroesophageal reflux disease)    takes Nexium daily  . Gout 1995   "~ once/yr" (03/17/2014)  . History of colon polyps   . Hyperlipidemia 12/2005   takes Atorvastatin daily  . Hypertension 1995   takes Lisinopril and Coreg daily  . possible HCAP (healthcare-associated pneumonia) 12/09/2013  . Prostate cancer ([place]) 2007   S/P treatment, followed by Urology prev  . Right bundle branch block (RBBB)   . Stenosis of cervical spine   . Type II  diabetes mellitus ([place])    no meds;diet and exercise controlled   . Wears dentures   . Wears glasses    BP (!) 147/83   Pulse 71   Temp 97.9 F (36.6 C)   Ht 5' 10.5" (1.791 m)   Wt 175 lb (79.4 kg)   SpO2 98%   BMI 24.75 kg/m   Opioid Risk Score:   Fall Risk Score:  `1  Depression screen PHQ 2/9  Depression screen [facility] 2/9 05/30/2020 05/25/2020 02/09/2020 12/10/2017 12/10/2016 11/29/2016 11/28/2015  Decreased Interest 0 0 0 0 0 0 0  Down, Depressed, Hopeless 0 0 0 0 0 0 0  PHQ - 2 Score 0 0 0 0 0 0 0  Altered sleeping - 2 - 0 - - -  Tired, decreased energy - 1 - 0 - - -  Change in appetite - 1 - 0 - - -  Feeling bad or failure about yourself  - 0 - 0 - - -  Trouble concentrating - 0 - 0 - - -  Moving slowly or fidgety/restless - 0 - 0 - - -  Suicidal thoughts - 0 - 0 - - -  PHQ-9 Score - 4 - 0 - - -  Difficult doing work/chores - - - Not difficult at all - - -    Review of Systems  Constitutional: Negative.   HENT: Negative.   Eyes: Negative.   Respiratory: Negative.   Cardiovascular: Negative.   Gastrointestinal: Negative.   Endocrine: Negative.   Genitourinary: Negative.   Musculoskeletal: Positive for gait problem and neck pain.  Skin: Negative.   Allergic/Immunologic: Negative.     Neurological: Positive for weakness and numbness.  Hematological: Negative.   Psychiatric/Behavioral: Negative.   All other systems reviewed and are negative.      Objective:   Physical Exam  Constitutional: No distress . Vital signs reviewed. HENT: Normocephalic.  Atraumatic. Eyes: EOMI. No discharge. Cardiovascular: No JVD.   Respiratory: Normal effort.  No stridor.   GI: Non-distended.   Skin: Warm and dry.  Neck incision healed. Psych: Normal mood.  Normal behavior. Musc: No edema in extremities.  No tenderness in extremities. Neuro: Alert Motor: RUE/RLE: 5/5 proximal distal, stable LLE: 5/5 proximal distally LUE: Shoulder abduction 2/5, EF 2/5, elbow extension 2+/5, handgrip 4/5    Assessment & Plan:  Male with history of GERD, essential tremor, COVID-19 02/2020, gait disturbance with numbness bilateral hands due to HNP spondylolysis and severe canal stenosis C3-C6 presents for follow up after C3-C6 posterior fusion.  1.  Impaired function/C5 nerve root dyspraxia/palsy s/p C3-C6 posterior fusion with LUE weakness/N/T and impaired balance, gait and LUE movement             Cont HEP, encouraged increase freq  Cont follow up with Neurosurg  Discussed safety and home health aide, cont for time beings  2. Pain Management:    Cont tylenol PRN  Encouraged ice/heat  Robaxin 500 BID PRN ordered   3. HTN:   Elevated today, states WNL at home  4. Slow transit constipation:   Continue Miralax prn  5.  Sleep disturbance:   Continue melatonin HS, may increase dose  See #1  6. Gait abnormality  Cont HEP  Cont walker for safety

## 2020-07-11 DIAGNOSIS — M4802 Spinal stenosis, cervical region: Secondary | ICD-10-CM | POA: Diagnosis not present

## 2020-07-11 DIAGNOSIS — M4712 Other spondylosis with myelopathy, cervical region: Secondary | ICD-10-CM | POA: Diagnosis not present

## 2020-07-11 DIAGNOSIS — M4315 Spondylolisthesis, thoracolumbar region: Secondary | ICD-10-CM | POA: Diagnosis not present

## 2020-07-11 DIAGNOSIS — Z4789 Encounter for other orthopedic aftercare: Secondary | ICD-10-CM | POA: Diagnosis not present

## 2020-07-11 DIAGNOSIS — G250 Essential tremor: Secondary | ICD-10-CM | POA: Diagnosis not present

## 2020-07-11 DIAGNOSIS — M50023 Cervical disc disorder at C6-C7 level with myelopathy: Secondary | ICD-10-CM | POA: Diagnosis not present

## 2020-07-14 ENCOUNTER — Other Ambulatory Visit: Payer: Self-pay | Admitting: *Deleted

## 2020-07-14 NOTE — Patient Outreach (Signed)
[facility]) Care Management  07/14/2020  [name] [name] 1929/01/31 [id number]   Telephone Assessment-HTN  RN spoke with pt today an verified [facility] has completed services and Pt has hired a Actuary to assist with ADL. States his previous paralysis has improved to the left side. Pt confirmed he received the [facility] packet with calendar. Pt has not been completed selft monitoring of his blood pressures but reports the last reading with the [facility] agency at 130/80. Pt has allowed the agency to monitor his readings with the documentation.  Now that the [facility] agency is no longer involved strongly encouraged pt to use her home device weekly for blood pressure check and document in the [facility] calendar for his providers to view. Educated pt on the importance of self monitoring and the risk if his blood pressures is not monitored. Pt verbalized an understanding and will start the process.  Verified pt continue to be adherent with his medications and medical appointments with no delays.  Review and discussed the current plan of care as pt on track and again will start self monitoring his blood pressures. No acute issues reported and pt continue to do well with no encountered issues. Reminded pt of [facility] pharmacy and social worker if available resources are needed. No other needs at this time.   Will follow up next month with ongoing case management needs.  Goals Addressed            This Visit's Progress   . THN-Track and Manage My Blood Pressure   On track    Follow Up Date 07/14/2020   - check blood pressure weekly - choose a place to take my blood pressure (home, clinic or office, retail store) - write blood pressure results in a log or diary    Why is this important?   You won't feel high blood pressure, but it can still hurt your blood vessels.  High blood pressure can cause heart or kidney problems. It can also cause a stroke.  Making lifestyle changes like losing a little  weight or eating less salt will help.  Checking your blood pressure at home and at different times of the day can help to control blood pressure.  If the doctor prescribes medicine remember to take it the way the doctor ordered.  Call the office if you cannot afford the medicine or if there are questions about it.     Notes:        [name] Mina, RN Care Management Coordinator [place] Office [phone]

## 2020-07-18 DIAGNOSIS — Z23 Encounter for immunization: Secondary | ICD-10-CM | POA: Diagnosis not present

## 2020-08-01 ENCOUNTER — Other Ambulatory Visit: Payer: Self-pay | Admitting: Family Medicine

## 2020-08-01 NOTE — Telephone Encounter (Signed)
Pharmacy requests refill on: Tamsulosin 0.4 mg   LAST REFILL: 05/17/2020 LAST OV: 06/06/2020 NEXT OV: Not Scheduled  PHARMACY: [id number] [place], [place]

## 2020-08-14 ENCOUNTER — Other Ambulatory Visit: Payer: Self-pay | Admitting: *Deleted

## 2020-08-14 NOTE — Patient Outreach (Signed)
[facility]) Care Management  08/14/2020  [name] [name] 1929/03/03 [id number]   Telephone Assessment-Successful  RN spoke with pt today and received an update on his ongoing management of care. Pt states his sitter is "working out well" and the recent paralysis continue to improve to his left arm. Pt admits he has not taking any blood pressures since the [place] stopped. RN inquired on a blood pressure device and offered to send pt a device however pt was interested and will attempt to obtain his blood pressures from his providers. RN also encouraged local pharmacy ([facility]) that have ongoing units for testing (pt receptive). RN also verified adherence with ongoing medical appointments and medication adherence along with needed supplies.   Will review and discuss the current plan of care in place along with all goals and interventions as pt attempting to stay on track however will extend to allow ongoing adherence with his weekly monitoring on his blood pressures. Will continue to encourage adherence and remain available to assist pt with meeting his goals.   Will follow up next month and update pt's provider on his ongoing disposition with participating with managing his ongoing care via [facility] services.  Goals Addressed            This Visit's Progress   . THN-Track and Manage My Blood Pressure   On track    Follow Up Date 09/15/2020   - check blood pressure weekly - choose a place to take my blood pressure (home, clinic or office, retail store) - write blood pressure results in a log or diary    Why is this important?   You won't feel high blood pressure, but it can still hurt your blood vessels.  High blood pressure can cause heart or kidney problems. It can also cause a stroke.  Making lifestyle changes like losing a little weight or eating less salt will help.  Checking your blood pressure at home and at different times of the day can help to  control blood pressure.  If the doctor prescribes medicine remember to take it the way the doctor ordered.  Call the office if you cannot afford the medicine or if there are questions about it.     Notes: Extending this goal to allow adherence with monitoring his ongoing blood pressures independently. Encouraged pt to log all b/p for providers to view.       [name] Mina, RN Care Management Coordinator [place] Office [phone]

## 2020-08-22 DIAGNOSIS — M4802 Spinal stenosis, cervical region: Secondary | ICD-10-CM | POA: Diagnosis not present

## 2020-09-14 ENCOUNTER — Encounter: Payer: Medicare Other | Admitting: Physical Medicine & Rehabilitation

## 2020-09-18 ENCOUNTER — Other Ambulatory Visit: Payer: Self-pay | Admitting: *Deleted

## 2020-09-18 NOTE — Patient Outreach (Signed)
[place] [facility]) Care Management  09/18/2020  [name] [name] [birth year] [id number]   Telephone Assessment-HTN  RN spoke with pt today and review and discussed the current plan of care. Discussed all goals and interventions and inquired on pt's symptoms management. Pt reports no symptoms encountered and recent provider visit. States his blood pressure reading was 110/70. Provider was please an there was no changes in his ongoing medications. Pt states he continues to feels good with no acute issues or needs at this time. Note supportive care in the home daughter [name] if further assistance is needed.   Plan of care review and discussed with noted updates, goals and interventions. Encouraged ongoing adherence as pt continues to manager his care accordingly. Again RN offered home blood pressure device for self monitoring however pt declined at this time indicating his has monthly follow up appointments for blood pressure checks and does not need a device at this time. RN will reiterate on possible signs/symptoms that maybe presented and what to do if acute symptoms should occur. No other inquires or request at this time as pt very appreciative for the monthly follow up calls of inquires.   Will follow up next month and continue to encouraged ongoing adherence with the current plan of care. Noted changes and updates in the plan of care.   Goals Addressed            This Visit's Progress   . THN-Track and Manage My Blood Pressure   On track    Follow Up Date: 10/19/2020 Timeframe:  Long-term Goal Priority:  Medium Start Date:     09/18/2020                        Expected End Date:  01/05/2021                     - check blood pressure weekly - choose a place to take my blood pressure (home, clinic or office, retail store) - write blood pressure results in a log or diary    Why is this important?   You won't feel high blood pressure, but it can still hurt your blood vessels.   High blood pressure can cause heart or kidney problems. It can also cause a stroke.  Making lifestyle changes like losing a little weight or eating less salt will help.  Checking your blood pressure at home and at different times of the day can help to control blood pressure.  If the doctor prescribes medicine remember to take it the way the doctor ordered.  Call the office if you cannot afford the medicine or if there are questions about it.     Notes: Pt prefers office visit monthly and reports those readings accordingly to  this RN. Most recent read 110/70 last visit pending another visit 10/24/2020. Pt declined home device for self monitoring. Will continue to encouraged adherence with education on possible symptom management.        [name], RN Care Management Coordinator [place] Office [phone]

## 2020-10-05 ENCOUNTER — Other Ambulatory Visit: Payer: Self-pay | Admitting: Family Medicine

## 2020-10-19 ENCOUNTER — Other Ambulatory Visit: Payer: Self-pay | Admitting: *Deleted

## 2020-10-19 NOTE — Patient Outreach (Signed)
[facility]) Care Management  10/19/2020  CATO LIBURD October 30, 1928 [id number]   Telephone Assessment-Unsuccessful   RN attempted outreach today however unsuccessful. RN able to leave a HIPAA approved voice message requesting a call back.  Will rescheduled another outreach over the next week for ongoing [facility] services.  [name], RN Care Management Coordinator [place] Office [phone]

## 2020-10-21 ENCOUNTER — Other Ambulatory Visit: Payer: Self-pay | Admitting: Physical Medicine & Rehabilitation

## 2020-10-24 DIAGNOSIS — M542 Cervicalgia: Secondary | ICD-10-CM | POA: Diagnosis not present

## 2020-10-25 ENCOUNTER — Other Ambulatory Visit: Payer: Self-pay | Admitting: *Deleted

## 2020-10-25 NOTE — Patient Outreach (Addendum)
[place] [facility]) Care Management  10/25/2020  [name] [name] [birth year] [id number]   Telephone Assessment-Unsuccessful  RN attempted outreach call today however unsuccessful. RN left a HIPAA approved voice message requesting a call back. Will continue to engage in pt's management of care at that time.  Will reschedule another outreach call over the next week and send an outreach letter for ongoing services.  [name], RN Care Management Coordinator [place] Office [phone]

## 2020-10-26 ENCOUNTER — Other Ambulatory Visit: Payer: Self-pay | Admitting: *Deleted

## 2020-10-26 ENCOUNTER — Encounter: Payer: Self-pay | Admitting: *Deleted

## 2020-10-27 NOTE — Patient Outreach (Signed)
[name]) Care Management  10/27/2020  [name] [name] 1929/01/15 [id number]  Telephone Assessment-Successful-HTN  RN spoke with pt today who expressed all events and updated RN accordingly with his progress. Pt reports no signs/symptoms related to hypo-hypertension with readings 140/80 at his last provider visit. Continue to offer a home monitoring blood pressure device however pt declined and again indicated he visits at least one provider monthly and obtains his readings at the doctor's office. Pt confirms ongoing transportation to all medical appointments and supply of all refilled medications. Pt taking accordingly to how they are prescribed. No acute issues at this time to address.   Will follow up next month with pt's ongoing management of care and continue to encourage pt to adhere to the reviewed and discussed plan of care. All interventions and goals discussed as pt receptive to this plan. Will follow up with his provider next month with a quarterly update.   Goals Addressed            This Visit's Progress   . THN-Track and Manage My Blood Pressure   On track    Follow Up Date: 11/22/2020 Timeframe:  Long-term Goal Priority:  Medium Start Date:     09/18/2020                        Expected End Date:  01/05/2021                     - check blood pressure weekly - choose a place to take my blood pressure (home, clinic or office, retail store) - write blood pressure results in a log or diary    Why is this important?   You won't feel high blood pressure, but it can still hurt your blood vessels.  High blood pressure can cause heart or kidney problems. It can also cause a stroke.  Making lifestyle changes like losing a little weight or eating less salt will help.  Checking your blood pressure at home and at different times of the day can help to control blood pressure.  If the doctor prescribes medicine remember to take it the way the doctor ordered.   Call the office if you cannot afford the medicine or if there are questions about it.     Notes: Feb-Pt reports updated readings on his blood pressure 140/80 at his provider's office this week. Pt continue to deny any reported symptoms. Again pt prefers to obtain his blood pressures readings from his providers office and does wish to have a home device for ongoing monitoring.  Jan- Pt prefers office visit monthly and reports those readings accordingly to  this RN. Most recent read 110/70 last visit pending another visit 10/24/2020. Pt declined home device for self monitoring. Will continue to encouraged adherence with education on possible symptom management.        [name], RN Care Management Coordinator [place] Office [phone]

## 2020-10-31 ENCOUNTER — Ambulatory Visit: Payer: Self-pay | Admitting: *Deleted

## 2020-11-01 ENCOUNTER — Other Ambulatory Visit: Payer: Self-pay

## 2020-11-01 ENCOUNTER — Encounter (HOSPITAL_COMMUNITY): Payer: Self-pay | Admitting: Physical Therapy

## 2020-11-01 ENCOUNTER — Ambulatory Visit (HOSPITAL_COMMUNITY): Payer: Medicare Other | Attending: Neurological Surgery | Admitting: Physical Therapy

## 2020-11-01 DIAGNOSIS — M542 Cervicalgia: Secondary | ICD-10-CM | POA: Insufficient documentation

## 2020-11-01 DIAGNOSIS — R293 Abnormal posture: Secondary | ICD-10-CM | POA: Insufficient documentation

## 2020-11-01 NOTE — Patient Instructions (Signed)
Access Code: [email or web address] URL: [email or web address] Date: 11/01/2020 Prepared by: Josue Hector  Exercises Seated Cervical Retraction - 3 x daily - 7 x weekly - 2 sets - 10 reps - 5 seconds hold Seated Scapular Retraction - 3 x daily - 7 x weekly - 2 sets - 10 reps - 5 seconds hold Seated Cervical Rotation AROM - 3 x daily - 7 x weekly - 1 sets - 10 reps

## 2020-11-01 NOTE — Therapy (Signed)
[place], [place], [address] Phone: [phone]   Fax:  [phone]  Physical Therapy Evaluation  Patient Details  Name: [name] [name] MRN: [id number] Date of Birth: [birth year] Referring Provider (PT): Sherley Bounds MD   Encounter Date: 11/01/2020   PT End of Session - 11/01/20 1745    Visit Number 1    Number of Visits 8    Date for PT Re-Evaluation 11/29/20    Authorization Type Medicare Part A/ Tric[employer] 2ndary    PT Start Time 1645    PT Stop Time 1740    PT Time Calculation (min) 55 min    Activity Tolerance Patient tolerated treatment well    Behavior During Therapy [facility] for tasks assessed/performed           Past Medical History:  Diagnosis Date  . Acute renal failure ([place]) 12/06/2013  . Back pain    occasionally  . Benign essential tremor 2005   takes Primidone daily  . Choledocholithiasis 12/06/2013  . Constipation    takes OTC stool softener  . Coronary artery disease 2007   s/p CABG, DR Johnsie Cancel  . GERD (gastroesophageal reflux disease)    takes Nexium daily  . Gout 1995   "~ once/yr" (03/17/2014)  . History of colon polyps   . Hyperlipidemia 12/2005   takes Atorvastatin daily  . Hypertension 1995   takes Lisinopril and Coreg daily  . possible HCAP (healthcare-associated pneumonia) 12/09/2013  . Prostate cancer ([place]) 2007   S/P treatment, followed by Urology prev  . Right bundle branch block (RBBB)   . Stenosis of cervical spine   . Type II diabetes mellitus (HCC)    no meds;diet and exercise controlled   . Wears dentures   . Wears glasses     Past Surgical History:  Procedure Laterality Date  . Adenosine myoview  01/24/2006   Ischemia by EKG, Cath  . CATARACT EXTRACTION W/ INTRAOCULAR LENS IMPLANT Left 10/2013  . CHOLECYSTECTOMY N/A 12/06/2013   Procedure: Diagnostic Laparoscopy for  drainage Intrabdominal abcess;  Surgeon: Rolm Bookbinder, MD;  Location: [place];  Service: General;   Laterality: N/A;  . CHOLECYSTECTOMY N/A 03/17/2014   Procedure: LAPAROSCOPIC CHOLECYSTECTOMY ;  Surgeon: Rolm Bookbinder, MD;  Location: [place];  Service: General;  Laterality: N/A;  . COLONOSCOPY    . CORONARY ARTERY BYPASS GRAFT  2007   CABGX 4  . CYSTOSCOPY  02/15/2004   Mod BPH, moder tribec ?  Marland Kitchen ERCP N/A 12/06/2013   Procedure: ENDOSCOPIC RETROGRADE CHOLANGIOPANCREATOGRAPHY (ERCP);  Surgeon: Milus Banister, MD;  Location: [place];  Service: Endoscopy;  Laterality: N/A;  . LAPAROSCOPIC CHOLECYSTECTOMY  03/17/2014  . LUMBAR FUSION  02/2012   lumbar spine for spinal stenosis  . POSTERIOR CERVICAL FUSION/FORAMINOTOMY N/A 05/01/2020   Procedure: Posterior cervical fusion with lateral mass fixation - Cervical three - Cervical six, laminectomy Cervical three-six;  Surgeon: Eustace Moore, MD;  Location: [place];  Service: Neurosurgery;  Laterality: N/A;  . PROSTATE CRYOABLATION  09/18/2005   prostate CA    There were no vitals filed for this visit.    Subjective Assessment - 11/01/20 1703    Subjective Patient presents to physical therapy with complaint of neck pain. He says he has had neck pain since his cervical fusion on 05/01/20. He had some trouble with weakness in his LT arm. He had some therapy for his arm which helped. He still has some weakness but can move  it which he could not before. Patient reports ongoing pain in center of neck. He is taking muscle relaxers for pain. He had a cervical fusion and laminectomy from C3-6.    Pertinent History C3-6 cervical fusion/ laminectomy    Limitations Sitting;House hold activities    Patient Stated Goals Be pain free    Currently in Pain? Yes    Pain Score 4     Pain Location Neck    Pain Orientation Posterior    Pain Descriptors / Indicators Aching;Constant;Dull    Pain Type Chronic pain    Pain Onset More than a month ago    Pain Frequency Constant    Aggravating Factors  looking down, lifting    Pain Relieving Factors position (looking  upward)    Effect of Pain on Daily Activities Limits              [facility] PT Assessment - 11/01/20 0001      Assessment   Medical Diagnosis Cervicalgia    Referring Provider (PT) Sherley Bounds MD    Onset Date/Surgical Date 05/01/20    Prior Therapy Yes      Balance Screen   Has the patient fallen in the past 6 months No      [place] residence    Living Arrangements Children      Prior Function   Level of Independence Independent    Vocation Retired    Medical laboratory scientific officer    Leisure Golf, fishing      Cognition   Overall Cognitive Status Within Functional Limits for tasks assessed      Posture/Postural Control   Posture/Postural Control Postural limitations    Postural Limitations Rounded Shoulders;Forward head      ROM / Strength   AROM / PROM / Strength AROM;Strength      AROM   Overall AROM Comments Mod restriciton in LT shoulder elevation and external rotation    AROM Assessment Site Cervical    Cervical Flexion 35   some discomfort   Cervical Extension 31    Cervical - Right Rotation 50    Cervical - Left Rotation 40      Strength   Strength Assessment Site Shoulder      Flexibility   Soft Tissue Assessment /Muscle Length --   mod restriction in bilateral upper trap     Palpation   Palpation comment Min/Mod TTP about bilateral upper trap/ levator/ cervical paraspinals                      Objective measurements completed on examination: See above findings.       [place] Adult PT Treatment/Exercise - 11/01/20 0001      Exercises   Exercises Neck      Neck Exercises: Seated   Neck Retraction 10 reps;5 secs      Neck Exercises: Supine   Other Supine Exercise scapular retraction 10 x5"      Manual Therapy   Manual Therapy Soft tissue mobilization    Manual therapy comments Performed separte from all other activity    Soft tissue mobilization IASTM to bilateral upper trap, levator, cervical  paraspinals with patient seated                  PT Education - 11/01/20 1706    Education Details on evaluation findings, POC and HEP    Person(s) Educated Patient    Methods Explanation;Handout  Comprehension Verbalized understanding            PT Short Term Goals - 11/01/20 1752      PT SHORT TERM GOAL #1   Title Patient will be independent with initial HEP and self-management strategies to improve functional outcomes    Time 2    Period Weeks    Status New    Target Date 11/15/20             PT Long Term Goals - 11/01/20 1752      PT LONG TERM GOAL #1   Title Patient will report at least 70% overall improvement in subjective complaint to indicate improvement in ability to perform ADLs.    Time 4    Period Weeks    Status New    Target Date 11/29/20      PT LONG TERM GOAL #2   Title Patient improve LT cervical rotation by 10 degrees in order to improve ability to scan environment for safety and while driving.    Time 4    Period Weeks    Status New    Target Date 11/29/20      PT LONG TERM GOAL #3   Title Patient will be able to sit comfortably for >1 hour with no increased neck pain for improved ability sitting in car, watching TV, reading, having dinner    Time 4    Period Weeks    Status New    Target Date 11/29/20                  Plan - 11/01/20 1746    Clinical Impression Statement Patient is a 87 y.o. male who presents to physical therapy with complaint of neck pain. Patient demonstrates decreased strength, ROM restriction, reduced flexibility, increased tenderness to palpation and postural abnormalities which are likely contributing to symptoms of pain and are negatively impacting patient ability to perform ADLs. Patient will benefit from skilled physical therapy services to address these deficits to reduce pain and improve level of function with ADLs    Personal Factors and Comorbidities Age    Examination-Activity Limitations  Reach Overhead;Sit;Lift;Other;Hygiene/Grooming;Carry    Examination-Participation Restrictions Community Activity;Cleaning;Yard Work    Stability/Clinical Decision Making Stable/Uncomplicated    Designer, jewellery Low    Rehab Potential Good    PT Frequency 2x / week    PT Duration 4 weeks    PT Treatment/Interventions ADLs/Self Care Home Management;Ultrasound;Neuromuscular re-education;Parrafin;Compression bandaging;Visual/perceptual remediation/compensation;Fluidtherapy;Scar mobilization;Passive range of motion;Patient/family education;Contrast Bath;Aquatic Therapy;Biofeedback;DME Instruction;Canalith Repostioning;Gait training;Orthotic Fit/Training;Dry needling;Spinal Manipulations;Joint Manipulations;Energy conservation;Stair training;Cryotherapy;Electrical Stimulation;Functional mobility training;Splinting;Taping;Vasopneumatic Device;Therapeutic activities;Manual techniques;Iontophoresis 4mg /ml Dexamethasone;Therapeutic exercise;Moist Heat;Traction;Balance training;Manual lymph drainage;Vestibular    PT Next Visit Plan Review goals and HEP. Progress cervical mobility and postural strengthening as tolerated. Add band rows/ extension. Continue manual STM/IASTM for restricitons and pain in neck. Hold DN near cervical spine per chart history of cervical laminectomy C3-6, unless noted otherwise.    PT Home Exercise Plan Eval: chin tuck, scapular retraction, cervical rotation AROM    Consulted and Agree with Plan of Care Patient           Patient will benefit from skilled therapeutic intervention in order to improve the following deficits and impairments:  Impaired flexibility,Postural dysfunction,Decreased range of motion,Improper body mechanics,Impaired UE functional use,Pain,Increased fascial restricitons,Decreased strength  Visit Diagnosis: Cervicalgia  Abnormal posture     Problem List Patient Active Problem List   Diagnosis Date Noted  . Slow transit constipation 07/10/2020   . Sleep disturbance  07/10/2020  . Abnormality of gait 07/10/2020  . Insomnia 06/12/2020  . AKI (acute kidney injury) ([place])   . Steroid-induced hyperglycemia   . Controlled type 2 diabetes mellitus with hyperglycemia, without long-term current use of insulin ([place])   . Hyponatremia   . Hypoalbuminemia due to protein-calorie malnutrition ([place])   . Transaminitis   . Leucocytosis   . Acute on chronic anemia   . Stenosis of cervical spine with myelopathy ([place]) 05/04/2020  . S/P cervical spinal fusion 05/01/2020  . History of agent Orange exposure 04/05/2020  . Paresthesia 01/20/2020  . Health care maintenance 12/28/2017  . GERD (gastroesophageal reflux disease) 12/28/2017  . Medicare annual wellness visit, initial 10/30/2014  . Advance care planning 10/30/2014  . Right bundle branch block (RBBB)   . Wheezing 12/09/2013  . CKD (chronic kidney disease), stage III ([place]) 12/06/2013  . Back pain 10/20/2011  . Hypothyroidism 03/23/2009  . UNSPECIFIED VITAMIN D DEFICIENCY 03/23/2009  . Tremor 12/18/2006  . Coronary atherosclerosis 12/18/2006  . ADENOCARCINOMA, PROSTATE, HX OF 12/18/2006  . HLD (hyperlipidemia) 12/08/2005  . History of diabetes mellitus 02/07/2005  . Gout 09/09/1993  . Essential hypertension 09/09/1993    6:03 PM, 11/01/20 Josue Hector PT DPT  Physical Therapist with [facility]  [phone]   [facility] [address] [place], [place], [address] Phone: [phone]   Fax:  [phone]  Name: ORVILE CORONA MRN: [id number] Date of Birth: 11-Mar-1929

## 2020-11-09 ENCOUNTER — Other Ambulatory Visit: Payer: Self-pay

## 2020-11-09 ENCOUNTER — Encounter (HOSPITAL_COMMUNITY): Payer: Self-pay | Admitting: Physical Therapy

## 2020-11-09 ENCOUNTER — Ambulatory Visit (HOSPITAL_COMMUNITY): Payer: Medicare Other | Attending: Neurological Surgery | Admitting: Physical Therapy

## 2020-11-09 DIAGNOSIS — R293 Abnormal posture: Secondary | ICD-10-CM | POA: Insufficient documentation

## 2020-11-09 DIAGNOSIS — M542 Cervicalgia: Secondary | ICD-10-CM | POA: Diagnosis not present

## 2020-11-09 NOTE — Therapy (Signed)
[place], [place], [address] Phone: [phone]   Fax:  [phone]  Physical Therapy Treatment  Patient Details  Name: [name] [name] MRN: [id number] Date of Birth: Jul 26, 1929 Referring Provider (PT): Sherley Bounds MD   Encounter Date: 11/09/2020   PT End of Session - 11/09/20 1727    Visit Number 2    Number of Visits 8    Date for PT Re-Evaluation 11/29/20    Authorization Type Medicare Part A/ Tricare 2ndary    PT Start Time 1720    PT Stop Time 1810    PT Time Calculation (min) 50 min    Activity Tolerance Patient tolerated treatment well    Behavior During Therapy [facility] for tasks assessed/performed           Past Medical History:  Diagnosis Date  . Acute renal failure ([place]) 12/06/2013  . Back pain    occasionally  . Benign essential tremor 2005   takes Primidone daily  . Choledocholithiasis 12/06/2013  . Constipation    takes OTC stool softener  . Coronary artery disease 2007   s/p CABG, DR Johnsie Cancel  . GERD (gastroesophageal reflux disease)    takes Nexium daily  . Gout 1995   "~ once/yr" (03/17/2014)  . History of colon polyps   . Hyperlipidemia 12/2005   takes Atorvastatin daily  . Hypertension 1995   takes Lisinopril and Coreg daily  . possible HCAP (healthcare-associated pneumonia) 12/09/2013  . Prostate cancer ([place]) 2007   S/P treatment, followed by Urology prev  . Right bundle branch block (RBBB)   . Stenosis of cervical spine   . Type II diabetes mellitus (HCC)    no meds;diet and exercise controlled   . Wears dentures   . Wears glasses     Past Surgical History:  Procedure Laterality Date  . Adenosine myoview  01/24/2006   Ischemia by EKG, Cath  . CATARACT EXTRACTION W/ INTRAOCULAR LENS IMPLANT Left 10/2013  . CHOLECYSTECTOMY N/A 12/06/2013   Procedure: Diagnostic Laparoscopy for  drainage Intrabdominal abcess;  Surgeon: Rolm Bookbinder, MD;  Location: [place];  Service: General;   Laterality: N/A;  . CHOLECYSTECTOMY N/A 03/17/2014   Procedure: LAPAROSCOPIC CHOLECYSTECTOMY ;  Surgeon: Rolm Bookbinder, MD;  Location: [place];  Service: General;  Laterality: N/A;  . COLONOSCOPY    . CORONARY ARTERY BYPASS GRAFT  2007   CABGX 4  . CYSTOSCOPY  02/15/2004   Mod BPH, moder tribec ?  Marland Kitchen ERCP N/A 12/06/2013   Procedure: ENDOSCOPIC RETROGRADE CHOLANGIOPANCREATOGRAPHY (ERCP);  Surgeon: Milus Banister, MD;  Location: [place];  Service: Endoscopy;  Laterality: N/A;  . LAPAROSCOPIC CHOLECYSTECTOMY  03/17/2014  . LUMBAR FUSION  02/2012   lumbar spine for spinal stenosis  . POSTERIOR CERVICAL FUSION/FORAMINOTOMY N/A 05/01/2020   Procedure: Posterior cervical fusion with lateral mass fixation - Cervical three - Cervical six, laminectomy Cervical three-six;  Surgeon: Eustace Moore, MD;  Location: [place];  Service: Neurosurgery;  Laterality: N/A;  . PROSTATE CRYOABLATION  09/18/2005   prostate CA    There were no vitals filed for this visit.   Subjective Assessment - 11/09/20 1724    Subjective Patient says his neck is sore. HEP is not too hard. He has been doing them. Pain is about a 3. "It's still there"    Pertinent History C3-6 cervical fusion/ laminectomy    Limitations Sitting;House hold activities    Patient Stated Goals Be pain free  Currently in Pain? Yes    Pain Score 3     Pain Location Neck    Pain Orientation Posterior    Pain Descriptors / Indicators Aching;Dull    Pain Type Chronic pain    Pain Onset More than a month ago    Pain Frequency Constant                             [facility] Adult PT Treatment/Exercise - 11/09/20 0001      Neck Exercises: Standing   Other Standing Exercises band rows and extension GTB 2 x 10 each      Neck Exercises: Seated   Neck Retraction 10 reps;5 secs    Cervical Rotation Both;15 reps    Other Seated Exercise scapular retraction 10 x 5"      Manual Therapy   Manual Therapy Soft tissue mobilization    Manual  therapy comments Performed separte from all other activity    Soft tissue mobilization IASTM to bilateral upper trap, levator, cervical paraspinals with patient seated      Neck Exercises: Stretches   Upper Trapezius Stretch Right;Left;3 reps;30 seconds                    PT Short Term Goals - 11/01/20 1752      PT SHORT TERM GOAL #1   Title Patient will be independent with initial HEP and self-management strategies to improve functional outcomes    Time 2    Period Weeks    Status New    Target Date 11/15/20             PT Long Term Goals - 11/01/20 1752      PT LONG TERM GOAL #1   Title Patient will report at least 70% overall improvement in subjective complaint to indicate improvement in ability to perform ADLs.    Time 4    Period Weeks    Status New    Target Date 11/29/20      PT LONG TERM GOAL #2   Title Patient improve LT cervical rotation by 10 degrees in order to improve ability to scan environment for safety and while driving.    Time 4    Period Weeks    Status New    Target Date 11/29/20      PT LONG TERM GOAL #3   Title Patient will be able to sit comfortably for >1 hour with no increased neck pain for improved ability sitting in car, watching TV, reading, having dinner    Time 4    Period Weeks    Status New    Target Date 11/29/20                 Plan - 11/09/20 1820    Clinical Impression Statement Reviewed goals and HEP. Patient required verbal cues throughout session for proper upright posture and continues to demo slouched seated positioning. Patient remains limited by cervical musculature restrictions. Addressed with addition of upper trap stretching and manual IASTM. Added band rows and extensions for postural strength progressions. Patient educated on proper form and function of all added exercises. Issued updated HEP. Patient will continue to benefit from skilled therapy services to progress postural strength and cervical  mobility for reduced neck pain and improved LOF with ADLs.    Personal Factors and Comorbidities Age    Examination-Activity Limitations Reach Overhead;Sit;Lift;Other;Hygiene/Grooming;Carry    Examination-Participation Restrictions Art gallery manager;[employer]  Work    Stability/Clinical Decision Making Stable/Uncomplicated    Rehab Potential Good    PT Frequency 2x / week    PT Duration 4 weeks    PT Treatment/Interventions ADLs/Self Care Home Management;Ultrasound;Neuromuscular re-education;Parrafin;Compression bandaging;Visual/perceptual remediation/compensation;Fluidtherapy;Scar mobilization;Passive range of motion;Patient/family education;Contrast Bath;Aquatic Therapy;Biofeedback;DME Instruction;Canalith Repostioning;Gait training;Orthotic Fit/Training;Dry needling;Spinal Manipulations;Joint Manipulations;Energy conservation;Stair training;Cryotherapy;Electrical Stimulation;Functional mobility training;Splinting;Taping;Vasopneumatic Device;Therapeutic activities;Manual techniques;Iontophoresis 4mg /ml Dexamethasone;Therapeutic exercise;Moist Heat;Traction;Balance training;Manual lymph drainage;Vestibular    PT Next Visit Plan Review goals and HEP. Progress cervical mobility and postural strengthening as tolerated. Continue manual STM/IASTM for restricitons and pain in neck. Hold DN near cervical spine per chart history of cervical laminectomy C3-6, unless noted otherwise.    PT Home Exercise Plan Eval: chin tuck, scapular retraction, cervical rotation AROM 3/3 UT stretch    Consulted and Agree with Plan of Care Patient           Patient will benefit from skilled therapeutic intervention in order to improve the following deficits and impairments:  Impaired flexibility,Postural dysfunction,Decreased range of motion,Improper body mechanics,Impaired UE functional use,Pain,Increased fascial restricitons,Decreased strength  Visit Diagnosis: Cervicalgia  Abnormal posture     Problem  List Patient Active Problem List   Diagnosis Date Noted  . Slow transit constipation 07/10/2020  . Sleep disturbance 07/10/2020  . Abnormality of gait 07/10/2020  . Insomnia 06/12/2020  . AKI (acute kidney injury) ([place])   . Steroid-induced hyperglycemia   . Controlled type 2 diabetes mellitus with hyperglycemia, without long-term current use of insulin ([place])   . Hyponatremia   . Hypoalbuminemia due to protein-calorie malnutrition ([place])   . Transaminitis   . Leucocytosis   . Acute on chronic anemia   . Stenosis of cervical spine with myelopathy ([place]) 05/04/2020  . S/P cervical spinal fusion 05/01/2020  . History of agent Orange exposure 04/05/2020  . Paresthesia 01/20/2020  . Health care maintenance 12/28/2017  . GERD (gastroesophageal reflux disease) 12/28/2017  . Medicare annual wellness visit, initial 10/30/2014  . Advance care planning 10/30/2014  . Right bundle branch block (RBBB)   . Wheezing 12/09/2013  . CKD (chronic kidney disease), stage III ([place]) 12/06/2013  . Back pain 10/20/2011  . Hypothyroidism 03/23/2009  . UNSPECIFIED VITAMIN D DEFICIENCY 03/23/2009  . Tremor 12/18/2006  . Coronary atherosclerosis 12/18/2006  . ADENOCARCINOMA, PROSTATE, HX OF 12/18/2006  . HLD (hyperlipidemia) 12/08/2005  . History of diabetes mellitus 02/07/2005  . Gout 09/09/1993  . Essential hypertension 09/09/1993    6:23 PM, 11/09/20 Josue Hector PT DPT  Physical Therapist with [facility]  [phone]   [facility] [address] [place], [place], [address] Phone: [phone]   Fax:  [phone]  Name: [name] [name] MRN: [id number] Date of Birth: 07-May-1929

## 2020-11-14 ENCOUNTER — Ambulatory Visit (HOSPITAL_COMMUNITY): Payer: Medicare Other | Admitting: Physical Therapy

## 2020-11-14 ENCOUNTER — Encounter (HOSPITAL_COMMUNITY): Payer: Self-pay | Admitting: Physical Therapy

## 2020-11-14 ENCOUNTER — Other Ambulatory Visit: Payer: Self-pay

## 2020-11-14 DIAGNOSIS — M542 Cervicalgia: Secondary | ICD-10-CM

## 2020-11-14 DIAGNOSIS — R293 Abnormal posture: Secondary | ICD-10-CM

## 2020-11-14 NOTE — Therapy (Signed)
[place], [place], [address] Phone: [phone]   Fax:  [phone]  Physical Therapy Treatment  Patient Details  Name: [name] [name] MRN: [id number] Date of Birth: 1929/05/19 Referring Provider (PT): Sherley Bounds MD   Encounter Date: 11/14/2020   PT End of Session - 11/14/20 1632    Visit Number 3    Number of Visits 8    Date for PT Re-Evaluation 11/29/20    Authorization Type Medicare Part A/ [employer] 2ndary    PT Start Time 1630    PT Stop Time 1715    PT Time Calculation (min) 45 min    Activity Tolerance Patient tolerated treatment well    Behavior During Therapy [facility] for tasks assessed/performed           Past Medical History:  Diagnosis Date  . Acute renal failure ([place]) 12/06/2013  . Back pain    occasionally  . Benign essential tremor 2005   takes Primidone daily  . Choledocholithiasis 12/06/2013  . Constipation    takes OTC stool softener  . Coronary artery disease 2007   s/p CABG, DR Johnsie Cancel  . GERD (gastroesophageal reflux disease)    takes Nexium daily  . Gout 1995   "~ once/yr" (03/17/2014)  . History of colon polyps   . Hyperlipidemia 12/2005   takes Atorvastatin daily  . Hypertension 1995   takes Lisinopril and Coreg daily  . possible HCAP (healthcare-associated pneumonia) 12/09/2013  . Prostate cancer ([place]) 2007   S/P treatment, followed by Urology prev  . Right bundle branch block (RBBB)   . Stenosis of cervical spine   . Type II diabetes mellitus (H[facility])    no meds;diet and exercise controlled   . Wears dentures   . Wears glasses     Past Surgical History:  Procedure Laterality Date  . Adenosine myoview  01/24/2006   Ischemia by EKG, Cath  . CATARACT EXTRACTION W/ INTRAOCULAR LENS IMPLANT Left 10/2013  . CHOLECYSTECTOMY N/A 12/06/2013   Procedure: Diagnostic Laparoscopy for  drainage Intrabdominal abcess;  Surgeon: Rolm Bookbinder, MD;  Location: [place];  Service: General;   Laterality: N/A;  . CHOLECYSTECTOMY N/A 03/17/2014   Procedure: LAPAROSCOPIC CHOLECYSTECTOMY ;  Surgeon: Rolm Bookbinder, MD;  Location: [place];  Service: General;  Laterality: N/A;  . COLONOSCOPY    . CORONARY ARTERY BYPASS GRAFT  2007   CABGX 4  . CYSTOSCOPY  02/15/2004   Mod BPH, moder tribec ?  Marland Kitchen ERCP N/A 12/06/2013   Procedure: ENDOSCOPIC RETROGRADE CHOLANGIOPANCREATOGRAPHY (ERCP);  Surgeon: Milus Banister, MD;  Location: [place];  Service: Endoscopy;  Laterality: N/A;  . LAPAROSCOPIC CHOLECYSTECTOMY  03/17/2014  . LUMBAR FUSION  02/2012   lumbar spine for spinal stenosis  . POSTERIOR CERVICAL FUSION/FORAMINOTOMY N/A 05/01/2020   Procedure: Posterior cervical fusion with lateral mass fixation - Cervical three - Cervical six, laminectomy Cervical three-six;  Surgeon: Eustace Moore, MD;  Location: [place];  Service: Neurosurgery;  Laterality: N/A;  . PROSTATE CRYOABLATION  09/18/2005   prostate CA    There were no vitals filed for this visit.   Subjective Assessment - 11/14/20 1632    Subjective Patient says "I feel about the same". He is thinking of getting a neck brace to hold his head up.    Pertinent History C3-6 cervical fusion/ laminectomy    Limitations Sitting;House hold activities    Patient Stated Goals Be pain free    Currently in Pain?  Yes    Pain Score 3     Pain Location Neck    Pain Orientation Posterior    Pain Descriptors / Indicators Aching;Dull    Pain Type Chronic pain    Pain Onset More than a month ago    Pain Frequency Constant                             [facility] Adult PT Treatment/Exercise - 11/14/20 0001      Neck Exercises: Standing   Other Standing Exercises band rows and extension RTB  x 10 each      Neck Exercises: Seated   Neck Retraction 10 reps;5 secs    Cervical Rotation Both;10 reps    Other Seated Exercise scapular retraction 10 x 5"    Other Seated Exercise cervical extension x 10      Neck Exercises: Supine   Other  Supine Exercise supine scap retraction, supine chin tuck, shoulder ER RTB, shoulder flexion RTB, abduction RTB 2 x 10 each      Manual Therapy   Manual Therapy Soft tissue mobilization    Manual therapy comments Performed separate from all other activity    Soft tissue mobilization IASTM to bilateral upper trap, levator, cervical paraspinals with patient seated      Neck Exercises: Stretches   Upper Trapezius Stretch Right;Left;3 reps;30 seconds                    PT Short Term Goals - 11/01/20 1752      PT SHORT TERM GOAL #1   Title Patient will be independent with initial HEP and self-management strategies to improve functional outcomes    Time 2    Period Weeks    Status New    Target Date 11/15/20             PT Long Term Goals - 11/01/20 1752      PT LONG TERM GOAL #1   Title Patient will report at least 70% overall improvement in subjective complaint to indicate improvement in ability to perform ADLs.    Time 4    Period Weeks    Status New    Target Date 11/29/20      PT LONG TERM GOAL #2   Title Patient improve LT cervical rotation by 10 degrees in order to improve ability to scan environment for safety and while driving.    Time 4    Period Weeks    Status New    Target Date 11/29/20      PT LONG TERM GOAL #3   Title Patient will be able to sit comfortably for >1 hour with no increased neck pain for improved ability sitting in car, watching TV, reading, having dinner    Time 4    Period Weeks    Status New    Target Date 11/29/20                 Plan - 11/14/20 1725    Clinical Impression Statement Progressed postural strengthening with added decompression series with thera band. Patient shows good tolerance. Patient educated on purpose and proper form of all added activity. Patient cued on hand placement with upper trap stretching and mechanics of cervical extension. Patient will continue to benefit from skilled therapy services to reduce  deficits and improve function.    Personal Factors and Comorbidities Age    Examination-Activity Limitations Reach Overhead;Sit;Lift;Other;Hygiene/Grooming;Carry  Examination-Participation Restrictions Art gallery manager;Yard Work    Stability/Clinical Decision Making Stable/Uncomplicated    Rehab Potential Good    PT Frequency 2x / week    PT Duration 4 weeks    PT Treatment/Interventions ADLs/Self Care Home Management;Ultrasound;Neuromuscular re-education;Parrafin;Compression bandaging;Visual/perceptual remediation/compensation;Fluidtherapy;Scar mobilization;Passive range of motion;Patient/family education;Contrast Bath;Aquatic Therapy;Biofeedback;DME Instruction;Canalith Repostioning;Gait training;Orthotic Fit/Training;Dry needling;Spinal Manipulations;Joint Manipulations;Energy conservation;Stair training;Cryotherapy;Electrical Stimulation;Functional mobility training;Splinting;Taping;Vasopneumatic Device;Therapeutic activities;Manual techniques;Iontophoresis 4mg /ml Dexamethasone;Therapeutic exercise;Moist Heat;Traction;Balance training;Manual lymph drainage;Vestibular    PT Next Visit Plan Continue manual STM/IASTM for restricitons and pain in neck. Hold DN near cervical spine per chart history of cervical laminectomy C3-6, unless noted otherwise. Add band sash progress band resistance as tolerated    PT Home Exercise Plan Eval: chin tuck, scapular retraction, cervical rotation AROM 3/3 UT stretch 3/8 band decompression    Consulted and Agree with Plan of Care Patient           Patient will benefit from skilled therapeutic intervention in order to improve the following deficits and impairments:  Impaired flexibility,Postural dysfunction,Decreased range of motion,Improper body mechanics,Impaired UE functional use,Pain,Increased fascial restricitons,Decreased strength  Visit Diagnosis: Cervicalgia  Abnormal posture     Problem List Patient Active Problem List   Diagnosis  Date Noted  . Slow transit constipation 07/10/2020  . Sleep disturbance 07/10/2020  . Abnormality of gait 07/10/2020  . Insomnia 06/12/2020  . AKI (acute kidney injury) ([place])   . Steroid-induced hyperglycemia   . Controlled type 2 diabetes mellitus with hyperglycemia, without long-term current use of insulin ([place])   . Hyponatremia   . Hypoalbuminemia due to protein-calorie malnutrition ([place])   . Transaminitis   . Leucocytosis   . Acute on chronic anemia   . Stenosis of cervical spine with myelopathy ([place]) 05/04/2020  . S/P cervical spinal fusion 05/01/2020  . History of agent Orange exposure 04/05/2020  . Paresthesia 01/20/2020  . Health care maintenance 12/28/2017  . GERD (gastroesophageal reflux disease) 12/28/2017  . Medicare annual wellness visit, initial 10/30/2014  . Advance care planning 10/30/2014  . Right bundle branch block (RBBB)   . Wheezing 12/09/2013  . CKD (chronic kidney disease), stage III ([place]) 12/06/2013  . Back pain 10/20/2011  . Hypothyroidism 03/23/2009  . UNSPECIFIED VITAMIN D DEFICIENCY 03/23/2009  . Tremor 12/18/2006  . Coronary atherosclerosis 12/18/2006  . ADENOCARCINOMA, PROSTATE, HX OF 12/18/2006  . HLD (hyperlipidemia) 12/08/2005  . History of diabetes mellitus 02/07/2005  . Gout 09/09/1993  . Essential hypertension 09/09/1993    5:30 PM, 11/14/20 Josue Hector PT DPT  Physical Therapist with [facility]  [phone]   [facility] [address] [place], [place], [address] Phone: [phone]   Fax:  [phone]  Name: VONTRELL PULLMAN MRN: [id number] Date of Birth: Mar 04, 1929

## 2020-11-16 ENCOUNTER — Encounter (HOSPITAL_COMMUNITY): Payer: Self-pay | Admitting: Physical Therapy

## 2020-11-16 ENCOUNTER — Other Ambulatory Visit: Payer: Self-pay

## 2020-11-16 ENCOUNTER — Ambulatory Visit (HOSPITAL_COMMUNITY): Payer: Medicare Other | Admitting: Physical Therapy

## 2020-11-16 DIAGNOSIS — R293 Abnormal posture: Secondary | ICD-10-CM

## 2020-11-16 DIAGNOSIS — M542 Cervicalgia: Secondary | ICD-10-CM | POA: Diagnosis not present

## 2020-11-16 NOTE — Therapy (Signed)
[place], [place], [address] Phone: [phone]   Fax:  [phone]  Physical Therapy Treatment  Patient Details  Name: [name] [name] MRN: [id number] Date of Birth: 21-Apr-1929 Referring Provider (PT): Sherley Bounds MD   Encounter Date: 11/16/2020   PT End of Session - 11/16/20 1523    Visit Number 4    Number of Visits 8    Date for PT Re-Evaluation 11/29/20    Authorization Type Medicare Part A/ Tricare 2ndary    PT Start Time 1518    PT Stop Time 1600    PT Time Calculation (min) 42 min    Activity Tolerance Patient tolerated treatment well    Behavior During Therapy [facility] for tasks assessed/performed           Past Medical History:  Diagnosis Date  . Acute renal failure ([place]) 12/06/2013  . Back pain    occasionally  . Benign essential tremor 2005   takes Primidone daily  . Choledocholithiasis 12/06/2013  . Constipation    takes OTC stool softener  . Coronary artery disease 2007   s/p CABG, DR Johnsie Cancel  . GERD (gastroesophageal reflux disease)    takes Nexium daily  . Gout 1995   "~ once/yr" (03/17/2014)  . History of colon polyps   . Hyperlipidemia 12/2005   takes Atorvastatin daily  . Hypertension 1995   takes Lisinopril and Coreg daily  . possible HCAP (healthcare-associated pneumonia) 12/09/2013  . Prostate cancer ([place]) 2007   S/P treatment, followed by Urology prev  . Right bundle branch block (RBBB)   . Stenosis of cervical spine   . Type II diabetes mellitus (H[facility])    no meds;diet and exercise controlled   . Wears dentures   . Wears glasses     Past Surgical History:  Procedure Laterality Date  . Adenosine myoview  01/24/2006   Ischemia by EKG, Cath  . CATARACT EXTRACTION W/ INTRAOCULAR LENS IMPLANT Left 10/2013  . CHOLECYSTECTOMY N/A 12/06/2013   Procedure: Diagnostic Laparoscopy for  drainage Intrabdominal abcess;  Surgeon: Rolm Bookbinder, MD;  Location: [place];  Service: General;   Laterality: N/A;  . CHOLECYSTECTOMY N/A 03/17/2014   Procedure: LAPAROSCOPIC CHOLECYSTECTOMY ;  Surgeon: Rolm Bookbinder, MD;  Location: [place];  Service: General;  Laterality: N/A;  . COLONOSCOPY    . CORONARY ARTERY BYPASS GRAFT  2007   CABGX 4  . CYSTOSCOPY  02/15/2004   Mod BPH, moder tribec ?  Marland Kitchen ERCP N/A 12/06/2013   Procedure: ENDOSCOPIC RETROGRADE CHOLANGIOPANCREATOGRAPHY (ERCP);  Surgeon: Milus Banister, MD;  Location: [facility];  Service: Endoscopy;  Laterality: N/A;  . LAPAROSCOPIC CHOLECYSTECTOMY  03/17/2014  . LUMBAR FUSION  02/2012   lumbar spine for spinal stenosis  . POSTERIOR CERVICAL FUSION/FORAMINOTOMY N/A 05/01/2020   Procedure: Posterior cervical fusion with lateral mass fixation - Cervical three - Cervical six, laminectomy Cervical three-six;  Surgeon: Eustace Moore, MD;  Location: [place];  Service: Neurosurgery;  Laterality: N/A;  . PROSTATE CRYOABLATION  09/18/2005   prostate CA    There were no vitals filed for this visit.   Subjective Assessment - 11/16/20 1522    Subjective Patient says his neck is doing a little better. He notes that he fell at his house coming for appointment today. He denies injury, says he fell on his LT hip. He denies current pain.    Pertinent History C3-6 cervical fusion/ laminectomy    Limitations Sitting;House hold activities  Patient Stated Goals Be pain free    Currently in Pain? Yes    Pain Score 2     Pain Location Neck    Pain Orientation Posterior    Pain Descriptors / Indicators Aching    Pain Type Chronic pain    Pain Onset More than a month ago    Pain Frequency Constant                             [facility] Adult PT Treatment/Exercise - 11/16/20 0001      Neck Exercises: Seated   Cervical Rotation Both;10 reps    Other Seated Exercise cervical extension x 10      Neck Exercises: Supine   Other Supine Exercise supine scap retraction, supine chin tuck, shoulder ER RTB, shoulder flexion RTB, abduction RTB,  supine band sash (AROM on LT side) 2 x 10 each      Manual Therapy   Manual Therapy Soft tissue mobilization    Manual therapy comments Performed separate from all other activity    Soft tissue mobilization IASTM to bilateral upper trap, levator, cervical paraspinals with patient seated      Neck Exercises: Stretches   Upper Trapezius Stretch Right;Left;3 reps;30 seconds                    PT Short Term Goals - 11/01/20 1752      PT SHORT TERM GOAL #1   Title Patient will be independent with initial HEP and self-management strategies to improve functional outcomes    Time 2    Period Weeks    Status New    Target Date 11/15/20             PT Long Term Goals - 11/01/20 1752      PT LONG TERM GOAL #1   Title Patient will report at least 70% overall improvement in subjective complaint to indicate improvement in ability to perform ADLs.    Time 4    Period Weeks    Status New    Target Date 11/29/20      PT LONG TERM GOAL #2   Title Patient improve LT cervical rotation by 10 degrees in order to improve ability to scan environment for safety and while driving.    Time 4    Period Weeks    Status New    Target Date 11/29/20      PT LONG TERM GOAL #3   Title Patient will be able to sit comfortably for >1 hour with no increased neck pain for improved ability sitting in car, watching TV, reading, having dinner    Time 4    Period Weeks    Status New    Target Date 11/29/20                 Plan - 11/16/20 1629    Clinical Impression Statement Patient progressing well and noting improved pain symptoms. Brief assessment of LLE per subjective of recent fall unremarkable. No notable change in functional mobility, patient denies pain, seated LE MMTs are [facility]. Added banded sash for postural strength progression. Patient with increased difficulty with LT UE strengthening per history of shoulder surgery. Patient will continue to benefit from skilled therapy services  to progress postural strength and cervical mobility to reduce pain and improve functional level.    Personal Factors and Comorbidities Age    Examination-Activity Limitations Reach Overhead;Sit;Lift;Other;Hygiene/Grooming;Carry  Examination-Participation Restrictions Art gallery manager;Yard Work    Stability/Clinical Decision Making Stable/Uncomplicated    Rehab Potential Good    PT Frequency 2x / week    PT Duration 4 weeks    PT Treatment/Interventions ADLs/Self Care Home Management;Ultrasound;Neuromuscular re-education;Parrafin;Compression bandaging;Visual/perceptual remediation/compensation;Fluidtherapy;Scar mobilization;Passive range of motion;Patient/family education;Contrast Bath;Aquatic Therapy;Biofeedback;DME Instruction;Canalith Repostioning;Gait training;Orthotic Fit/Training;Dry needling;Spinal Manipulations;Joint Manipulations;Energy conservation;Stair training;Cryotherapy;Electrical Stimulation;Functional mobility training;Splinting;Taping;Vasopneumatic Device;Therapeutic activities;Manual techniques;Iontophoresis 4mg /ml Dexamethasone;Therapeutic exercise;Moist Heat;Traction;Balance training;Manual lymph drainage;Vestibular    PT Next Visit Plan Continue manual STM/IASTM for restricitons and pain in neck. Hold DN near cervical spine per chart history of cervical laminectomy C3-6, unless noted otherwise. Add supine punches    PT Home Exercise Plan Eval: chin tuck, scapular retraction, cervical rotation AROM 3/3 UT stretch 3/8 band decompression    Consulted and Agree with Plan of Care Patient           Patient will benefit from skilled therapeutic intervention in order to improve the following deficits and impairments:  Impaired flexibility,Postural dysfunction,Decreased range of motion,Improper body mechanics,Impaired UE functional use,Pain,Increased fascial restricitons,Decreased strength  Visit Diagnosis: Cervicalgia  Abnormal posture     Problem List Patient  Active Problem List   Diagnosis Date Noted  . Slow transit constipation 07/10/2020  . Sleep disturbance 07/10/2020  . Abnormality of gait 07/10/2020  . Insomnia 06/12/2020  . AKI (acute kidney injury) ([place])   . Steroid-induced hyperglycemia   . Controlled type 2 diabetes mellitus with hyperglycemia, without long-term current use of insulin ([place])   . Hyponatremia   . Hypoalbuminemia due to protein-calorie malnutrition ([place])   . Transaminitis   . Leucocytosis   . Acute on chronic anemia   . Stenosis of cervical spine with myelopathy ([place]) 05/04/2020  . S/P cervical spinal fusion 05/01/2020  . History of agent Orange exposure 04/05/2020  . Paresthesia 01/20/2020  . Health care maintenance 12/28/2017  . GERD (gastroesophageal reflux disease) 12/28/2017  . Medicare annual wellness visit, initial 10/30/2014  . Advance care planning 10/30/2014  . Right bundle branch block (RBBB)   . Wheezing 12/09/2013  . CKD (chronic kidney disease), stage III ([place]) 12/06/2013  . Back pain 10/20/2011  . Hypothyroidism 03/23/2009  . UNSPECIFIED VITAMIN D DEFICIENCY 03/23/2009  . Tremor 12/18/2006  . Coronary atherosclerosis 12/18/2006  . ADENOCARCINOMA, PROSTATE, HX OF 12/18/2006  . HLD (hyperlipidemia) 12/08/2005  . History of diabetes mellitus 02/07/2005  . Gout 09/09/1993  . Essential hypertension 09/09/1993   4:48 PM, 11/16/20 Josue Hector PT DPT  Physical Therapist with [facility]  [phone]   [facility] [address] [place], [place], [address] Phone: [phone]   Fax:  [phone]  Name: [name] [name] MRN: [id number] Date of Birth: [birth year]

## 2020-11-21 ENCOUNTER — Ambulatory Visit (HOSPITAL_COMMUNITY): Payer: Medicare Other | Admitting: Physical Therapy

## 2020-11-21 ENCOUNTER — Encounter (HOSPITAL_COMMUNITY): Payer: Self-pay | Admitting: Physical Therapy

## 2020-11-21 ENCOUNTER — Other Ambulatory Visit: Payer: Self-pay

## 2020-11-21 DIAGNOSIS — M542 Cervicalgia: Secondary | ICD-10-CM | POA: Diagnosis not present

## 2020-11-21 DIAGNOSIS — R293 Abnormal posture: Secondary | ICD-10-CM

## 2020-11-21 NOTE — Therapy (Signed)
[place], [place], [address] Phone: [phone]   Fax:  [phone]  Physical Therapy Treatment  Patient Details  Name: [name] [name] MRN: [id number] Date of Birth: 05-26-1929 Referring Provider (PT): Sherley Bounds MD   Encounter Date: 11/21/2020   PT End of Session - 11/21/20 1654    Visit Number 5    Number of Visits 8    Date for PT Re-Evaluation 11/29/20    Authorization Type Medicare Part A/ Tricare 2ndary    PT Start Time 1647    PT Stop Time 1730    PT Time Calculation (min) 43 min    Activity Tolerance Patient tolerated treatment well    Behavior During Therapy [facility] for tasks assessed/performed           Past Medical History:  Diagnosis Date  . Acute renal failure ([place]) 12/06/2013  . Back pain    occasionally  . Benign essential tremor 2005   takes Primidone daily  . Choledocholithiasis 12/06/2013  . Constipation    takes OTC stool softener  . Coronary artery disease 2007   s/p CABG, DR Johnsie Cancel  . GERD (gastroesophageal reflux disease)    takes Nexium daily  . Gout 1995   "~ once/yr" (03/17/2014)  . History of colon polyps   . Hyperlipidemia 12/2005   takes Atorvastatin daily  . Hypertension 1995   takes Lisinopril and Coreg daily  . possible HCAP (healthcare-associated pneumonia) 12/09/2013  . Prostate cancer ([place]) 2007   S/P treatment, followed by Urology prev  . Right bundle branch block (RBBB)   . Stenosis of cervical spine   . Type II diabetes mellitus (HCC)    no meds;diet and exercise controlled   . Wears dentures   . Wears glasses     Past Surgical History:  Procedure Laterality Date  . Adenosine myoview  01/24/2006   Ischemia by EKG, Cath  . CATARACT EXTRACTION W/ INTRAOCULAR LENS IMPLANT Left 10/2013  . CHOLECYSTECTOMY N/A 12/06/2013   Procedure: Diagnostic Laparoscopy for  drainage Intrabdominal abcess;  Surgeon: Rolm Bookbinder, MD;  Location: [place];  Service: General;   Laterality: N/A;  . CHOLECYSTECTOMY N/A 03/17/2014   Procedure: LAPAROSCOPIC CHOLECYSTECTOMY ;  Surgeon: Rolm Bookbinder, MD;  Location: [place];  Service: General;  Laterality: N/A;  . COLONOSCOPY    . CORONARY ARTERY BYPASS GRAFT  2007   CABGX 4  . CYSTOSCOPY  02/15/2004   Mod BPH, moder tribec ?  Marland Kitchen ERCP N/A 12/06/2013   Procedure: ENDOSCOPIC RETROGRADE CHOLANGIOPANCREATOGRAPHY (ERCP);  Surgeon: Milus Banister, MD;  Location: [place];  Service: Endoscopy;  Laterality: N/A;  . LAPAROSCOPIC CHOLECYSTECTOMY  03/17/2014  . LUMBAR FUSION  02/2012   lumbar spine for spinal stenosis  . POSTERIOR CERVICAL FUSION/FORAMINOTOMY N/A 05/01/2020   Procedure: Posterior cervical fusion with lateral mass fixation - Cervical three - Cervical six, laminectomy Cervical three-six;  Surgeon: Eustace Moore, MD;  Location: [place];  Service: Neurosurgery;  Laterality: N/A;  . PROSTATE CRYOABLATION  09/18/2005   prostate CA    There were no vitals filed for this visit.   Subjective Assessment - 11/21/20 1653    Subjective Patient says "my neck hurts". He says he has some pain a little lower today when he moves a "certain way".    Pertinent History C3-6 cervical fusion/ laminectomy    Limitations Sitting;House hold activities    Patient Stated Goals Be pain free    Currently in  Pain? Yes    Pain Score 4     Pain Location Neck    Pain Orientation Posterior    Pain Descriptors / Indicators Aching    Pain Type Chronic pain    Pain Onset More than a month ago    Pain Frequency Constant                             [facility] Adult PT Treatment/Exercise - 11/21/20 0001      Neck Exercises: Standing   Other Standing Exercises band rows/ extension red 2 x 10 each    Other Standing Exercises wall slides x 10      Neck Exercises: Seated   Neck Retraction 10 reps;5 secs    Shoulder Rolls 20 reps;Backwards    Other Seated Exercise Pulleys flexion AAROM 3 min warmup    Other Seated Exercise cervical  excursions x 10 each      Manual Therapy   Manual Therapy Soft tissue mobilization    Manual therapy comments Performed separate from all other activity    Soft tissue mobilization IASTM to bilateral upper trap, levator, cervical paraspinals with patient seated      Neck Exercises: Stretches   Upper Trapezius Stretch Right;Left;3 reps;30 seconds                    PT Short Term Goals - 11/01/20 1752      PT SHORT TERM GOAL #1   Title Patient will be independent with initial HEP and self-management strategies to improve functional outcomes    Time 2    Period Weeks    Status New    Target Date 11/15/20             PT Long Term Goals - 11/01/20 1752      PT LONG TERM GOAL #1   Title Patient will report at least 70% overall improvement in subjective complaint to indicate improvement in ability to perform ADLs.    Time 4    Period Weeks    Status New    Target Date 11/29/20      PT LONG TERM GOAL #2   Title Patient improve LT cervical rotation by 10 degrees in order to improve ability to scan environment for safety and while driving.    Time 4    Period Weeks    Status New    Target Date 11/29/20      PT LONG TERM GOAL #3   Title Patient will be able to sit comfortably for >1 hour with no increased neck pain for improved ability sitting in car, watching TV, reading, having dinner    Time 4    Period Weeks    Status New    Target Date 11/29/20                 Plan - 11/21/20 1823    Clinical Impression Statement Patient progressing slowly. He continues to be limited with postural progressions due to LT shoulder issues. Had some difficulty fully elevating Lt side with wall slides and demos significant compensation with Rt leaning. Patient continues to report neck pain that is improved with manual intervention but returns likely due to his posturing. Educated patient on this and to improve postural awareness to avoid slouched sitting position for reduced  neck strain. Patient will continue to benefit from skilled therapy services to improve posture and postural awareness to reduce pain and  improve functional ability.    Personal Factors and Comorbidities Age    Examination-Activity Limitations Reach Overhead;Sit;Lift;Other;Hygiene/Grooming;Carry    Examination-Participation Restrictions Art gallery manager;Yard Work    Stability/Clinical Decision Making Stable/Uncomplicated    Rehab Potential Good    PT Frequency 2x / week    PT Duration 4 weeks    PT Treatment/Interventions ADLs/Self Care Home Management;Ultrasound;Neuromuscular re-education;Parrafin;Compression bandaging;Visual/perceptual remediation/compensation;Fluidtherapy;Scar mobilization;Passive range of motion;Patient/family education;Contrast Bath;Aquatic Therapy;Biofeedback;DME Instruction;Canalith Repostioning;Gait training;Orthotic Fit/Training;Dry needling;Spinal Manipulations;Joint Manipulations;Energy conservation;Stair training;Cryotherapy;Electrical Stimulation;Functional mobility training;Splinting;Taping;Vasopneumatic Device;Therapeutic activities;Manual techniques;Iontophoresis 4mg /ml Dexamethasone;Therapeutic exercise;Moist Heat;Traction;Balance training;Manual lymph drainage;Vestibular    PT Next Visit Plan Continue manual STM/IASTM for restricitons and pain in neck. Hold DN near cervical spine per chart history of cervical laminectomy C3-6, unless noted otherwise. Add scapular depression    PT Home Exercise Plan Eval: chin tuck, scapular retraction, cervical rotation AROM 3/3 UT stretch 3/8 band decompression    Consulted and Agree with Plan of Care Patient           Patient will benefit from skilled therapeutic intervention in order to improve the following deficits and impairments:  Impaired flexibility,Postural dysfunction,Decreased range of motion,Improper body mechanics,Impaired UE functional use,Pain,Increased fascial restricitons,Decreased strength  Visit  Diagnosis: Cervicalgia  Abnormal posture     Problem List Patient Active Problem List   Diagnosis Date Noted  . Slow transit constipation 07/10/2020  . Sleep disturbance 07/10/2020  . Abnormality of gait 07/10/2020  . Insomnia 06/12/2020  . AKI (acute kidney injury) ([place])   . Steroid-induced hyperglycemia   . Controlled type 2 diabetes mellitus with hyperglycemia, without long-term current use of insulin ([place])   . Hyponatremia   . Hypoalbuminemia due to protein-calorie malnutrition ([place])   . Transaminitis   . Leucocytosis   . Acute on chronic anemia   . Stenosis of cervical spine with myelopathy ([place]) 05/04/2020  . S/P cervical spinal fusion 05/01/2020  . History of agent Orange exposure 04/05/2020  . Paresthesia 01/20/2020  . Health care maintenance 12/28/2017  . GERD (gastroesophageal reflux disease) 12/28/2017  . Medicare annual wellness visit, initial 10/30/2014  . Advance care planning 10/30/2014  . Right bundle branch block (RBBB)   . Wheezing 12/09/2013  . CKD (chronic kidney disease), stage III ([place]) 12/06/2013  . Back pain 10/20/2011  . Hypothyroidism 03/23/2009  . UNSPECIFIED VITAMIN D DEFICIENCY 03/23/2009  . Tremor 12/18/2006  . Coronary atherosclerosis 12/18/2006  . ADENOCARCINOMA, PROSTATE, HX OF 12/18/2006  . HLD (hyperlipidemia) 12/08/2005  . History of diabetes mellitus 02/07/2005  . Gout 09/09/1993  . Essential hypertension 09/09/1993   6:28 PM, 11/21/20 Josue Hector PT DPT  Physical Therapist with [facility]  [phone]   [facility] [address] [place], [place], [address] Phone: [phone]   Fax:  [phone]  Name: [name] [name] MRN: [id number] Date of Birth: Jun 15, 1929

## 2020-11-22 ENCOUNTER — Other Ambulatory Visit: Payer: Self-pay | Admitting: *Deleted

## 2020-11-22 NOTE — Patient Outreach (Addendum)
[facility]) Care Management  11/22/2020  [name] [name] 1928-12-01 [id number]   Telephone Assessment-Successful-HTN  RN spoke with pt today and inquired on his ongoing management of care. Pt reports he continue to do well with no acute issues since the last conversation. Denies any symptoms related to hypo-hypertension as he continues to take all his prescribed medications (sufficient supplies). Also verified pt continue to have supportive assist (daughter [name]) when needed. Alone with sufficient transportation to all medical appointments.  Pt continues to decline the offer to received a blood pressure device for home monitoring and states no symptoms but he continue to be on alert if any symptoms are experienced and aware of what to do if symptoms should occur. Will continue to stress the importance of ongoing blood pressure monitoring to prevent acute exacerbation of HTN. Will continue to encouraged lower his risk again with healthy eating habits and exercising when tolerated.   Plan of care are review and discussed accordingly with noted updates within the plan. Will update provider on pt's progress and disposition with [facility] services. No other inquires or acute issues to address at this time.   Will follow up next month concerning pt's ongoing management of care related to his HTN.  Goals Addressed            This Visit's Progress   . [place] My Quality of Life       Follow Up Date 12/22/2020 Timeframe:  Long-Range Goal Priority:  Medium Start Date:   11/22/2020                          Expected End Date:     03/08/2021                  - complete a living will - do one enjoyable thing every day    Why is this important?    Having a long-term illness can be scary.   It can also be stressful for you and your caregiver.   These steps may help.    Notes: 3/16-Discussed upcoming birthday pt will be 87 years old. Discuss ongoing health measures to prevent  acute issues from occurring such as monitoring his ongoing blood pressures and ability to recognize potential symptoms if present. Pt is aware with HTN there maybe no signs present so maintaining a health diet, exercises and taking his blood pressure medication with assist and lower this risk and improve his ongoing quality of life. Discuss ongoing support daughter [name].     [place] and Manage My Blood Pressure   On track    Follow Up Date: 12/22/2020 Timeframe:  Long-term Goal Priority:  Medium Start Date:     09/18/2020                        Expected End Date:  01/05/2021                     - check blood pressure weekly - choose a place to take my blood pressure (home, clinic or office, retail store) - write blood pressure results in a log or diary    Why is this important?   You won't feel high blood pressure, but it can still hurt your blood vessels.  High blood pressure can cause heart or kidney problems. It can also cause a stroke.  Making lifestyle changes like losing a little weight or  eating less salt will help.  Checking your blood pressure at home and at different times of the day can help to control blood pressure.  If the doctor prescribes medicine remember to take it the way the doctor ordered.  Call the office if you cannot afford the medicine or if there are questions about it.     Notes: Feb-Pt reports updated readings on his blood pressure 140/80 at his provider's office this week. Pt continue to deny any reported symptoms. Again pt prefers to obtain his blood pressures readings from his providers office and does wish to have a home device for ongoing monitoring.  Jan- Pt prefers office visit monthly and reports those readings accordingly to  this RN. Most recent read 110/70 last visit pending another visit 10/24/2020. Pt declined home device for self monitoring. Will continue to encouraged adherence with education on possible symptom management.        [name],  RN Care Management Coordinator [place] Office [phone]

## 2020-11-23 ENCOUNTER — Encounter (HOSPITAL_COMMUNITY): Payer: Self-pay | Admitting: Physical Therapy

## 2020-11-23 ENCOUNTER — Other Ambulatory Visit: Payer: Self-pay

## 2020-11-23 ENCOUNTER — Ambulatory Visit (HOSPITAL_COMMUNITY): Payer: Medicare Other | Admitting: Physical Therapy

## 2020-11-23 DIAGNOSIS — R293 Abnormal posture: Secondary | ICD-10-CM | POA: Diagnosis not present

## 2020-11-23 DIAGNOSIS — M542 Cervicalgia: Secondary | ICD-10-CM | POA: Diagnosis not present

## 2020-11-23 NOTE — Therapy (Signed)
[place], [place], [address] Phone: [phone]   Fax:  [phone]  Physical Therapy Treatment  Patient Details  Name: [name] [name] MRN: [id number] Date of Birth: December 22, 1928 Referring Provider (PT): Sherley Bounds MD   Encounter Date: 11/23/2020   PT End of Session - 11/23/20 1650    Visit Number 6    Number of Visits 8    Date for PT Re-Evaluation 11/29/20    Authorization Type Medicare Part A/ Tricare 2ndary    PT Start Time 1645    PT Stop Time 1740    PT Time Calculation (min) 55 min    Activity Tolerance Patient tolerated treatment well    Behavior During Therapy [facility] for tasks assessed/performed           Past Medical History:  Diagnosis Date  . Acute renal failure ([place]) 12/06/2013  . Back pain    occasionally  . Benign essential tremor 2005   takes Primidone daily  . Choledocholithiasis 12/06/2013  . Constipation    takes OTC stool softener  . Coronary artery disease 2007   s/p CABG, DR Johnsie Cancel  . GERD (gastroesophageal reflux disease)    takes Nexium daily  . Gout 1995   "~ once/yr" (03/17/2014)  . History of colon polyps   . Hyperlipidemia 12/2005   takes Atorvastatin daily  . Hypertension 1995   takes Lisinopril and Coreg daily  . possible HCAP (healthcare-associated pneumonia) 12/09/2013  . Prostate cancer ([place]) 2007   S/P treatment, followed by Urology prev  . Right bundle branch block (RBBB)   . Stenosis of cervical spine   . Type II diabetes mellitus ([facility])    no meds;diet and exercise controlled   . Wears dentures   . Wears glasses     Past Surgical History:  Procedure Laterality Date  . Adenosine myoview  01/24/2006   Ischemia by EKG, Cath  . CATARACT EXTRACTION W/ INTRAOCULAR LENS IMPLANT Left 10/2013  . CHOLECYSTECTOMY N/A 12/06/2013   Procedure: Diagnostic Laparoscopy for  drainage Intrabdominal abcess;  Surgeon: Rolm Bookbinder, MD;  Location: [place];  Service: General;   Laterality: N/A;  . CHOLECYSTECTOMY N/A 03/17/2014   Procedure: LAPAROSCOPIC CHOLECYSTECTOMY ;  Surgeon: Rolm Bookbinder, MD;  Location: [place];  Service: General;  Laterality: N/A;  . COLONOSCOPY    . CORONARY ARTERY BYPASS GRAFT  2007   CABGX 4  . CYSTOSCOPY  02/15/2004   Mod BPH, moder tribec ?  Marland Kitchen ERCP N/A 12/06/2013   Procedure: ENDOSCOPIC RETROGRADE CHOLANGIOPANCREATOGRAPHY (ERCP);  Surgeon: Milus Banister, MD;  Location: [place];  Service: Endoscopy;  Laterality: N/A;  . LAPAROSCOPIC CHOLECYSTECTOMY  03/17/2014  . LUMBAR FUSION  02/2012   lumbar spine for spinal stenosis  . POSTERIOR CERVICAL FUSION/FORAMINOTOMY N/A 05/01/2020   Procedure: Posterior cervical fusion with lateral mass fixation - Cervical three - Cervical six, laminectomy Cervical three-six;  Surgeon: Eustace Moore, MD;  Location: [place];  Service: Neurosurgery;  Laterality: N/A;  . PROSTATE CRYOABLATION  09/18/2005   prostate CA    There were no vitals filed for this visit.   Subjective Assessment - 11/23/20 1648    Subjective Patient says he can tell his neck pain is still there but he feels it is getting a little better.    Pertinent History C3-6 cervical fusion/ laminectomy    Limitations Sitting;House hold activities    Patient Stated Goals Be pain free    Currently in Pain?  Yes    Pain Score 3     Pain Location Neck    Pain Orientation Posterior    Pain Descriptors / Indicators Aching    Pain Type Chronic pain    Pain Onset More than a month ago    Pain Frequency Constant    Aggravating Factors  positional, looking down    Pain Relieving Factors looking up    Effect of Pain on Daily Activities Limits                             [facility] Adult PT Treatment/Exercise - 11/23/20 0001      Neck Exercises: Standing   Other Standing Exercises scapular depression RTB 2 x 10      Neck Exercises: Supine   Other Supine Exercise supine scap retraction x15, supine chin tuck x15, shoulder ER RTB,  shoulder flexion RTB, abduction RTB, supine band sash (AROM on LT side) 2 x 10 each, supine unches RTB 2 x 10      Manual Therapy   Manual Therapy Soft tissue mobilization    Manual therapy comments Performed separate from all other activity    Soft tissue mobilization IASTM to bilateral upper trap, levator, cervical paraspinals with patient seated                    PT Short Term Goals - 11/01/20 1752      PT SHORT TERM GOAL #1   Title Patient will be independent with initial HEP and self-management strategies to improve functional outcomes    Time 2    Period Weeks    Status New    Target Date 11/15/20             PT Long Term Goals - 11/01/20 1752      PT LONG TERM GOAL #1   Title Patient will report at least 70% overall improvement in subjective complaint to indicate improvement in ability to perform ADLs.    Time 4    Period Weeks    Status New    Target Date 11/29/20      PT LONG TERM GOAL #2   Title Patient improve LT cervical rotation by 10 degrees in order to improve ability to scan environment for safety and while driving.    Time 4    Period Weeks    Status New    Target Date 11/29/20      PT LONG TERM GOAL #3   Title Patient will be able to sit comfortably for >1 hour with no increased neck pain for improved ability sitting in car, watching TV, reading, having dinner    Time 4    Period Weeks    Status New    Target Date 11/29/20                 Plan - 11/23/20 1752    Clinical Impression Statement Patient tolerated progressions well. Shows some improvement with supine band strengthening but still limited with LUE participation per past nerve injury. Added supine punch and scapular depression for postural and scapular strength progressions. Patient tolerated well, educated on proper form and purpose of each exercise. Patient notes decreased pain of 1/10 post treatment. Patient will continue to benefit from skilled therapy services to  improve posture for reduced neck pain and improved functional ability. Reassessment next week.    Personal Factors and Comorbidities Age    Examination-Activity Limitations Reach  Overhead;Sit;Lift;Other;Hygiene/Grooming;Carry    Examination-Participation Restrictions Art gallery manager;Yard Work    Stability/Clinical Decision Making Stable/Uncomplicated    Rehab Potential Good    PT Frequency 2x / week    PT Duration 4 weeks    PT Treatment/Interventions ADLs/Self Care Home Management;Ultrasound;Neuromuscular re-education;Parrafin;Compression bandaging;Visual/perceptual remediation/compensation;Fluidtherapy;Scar mobilization;Passive range of motion;Patient/family education;Contrast Bath;Aquatic Therapy;Biofeedback;DME Instruction;Canalith Repostioning;Gait training;Orthotic Fit/Training;Dry needling;Spinal Manipulations;Joint Manipulations;Energy conservation;Stair training;Cryotherapy;Electrical Stimulation;Functional mobility training;Splinting;Taping;Vasopneumatic Device;Therapeutic activities;Manual techniques;Iontophoresis 4mg /ml Dexamethasone;Therapeutic exercise;Moist Heat;Traction;Balance training;Manual lymph drainage;Vestibular    PT Next Visit Plan Continue manual STM/IASTM for restricitons and pain in neck. Hold DN near cervical spine per chart history of cervical laminectomy C3-6, unless noted otherwise. Reassess next visit    PT Home Exercise Plan Eval: chin tuck, scapular retraction, cervical rotation AROM 3/3 UT stretch 3/8 band decompression    Consulted and Agree with Plan of Care Patient           Patient will benefit from skilled therapeutic intervention in order to improve the following deficits and impairments:  Impaired flexibility,Postural dysfunction,Decreased range of motion,Improper body mechanics,Impaired UE functional use,Pain,Increased fascial restricitons,Decreased strength  Visit Diagnosis: Cervicalgia  Abnormal posture     Problem List Patient  Active Problem List   Diagnosis Date Noted  . Slow transit constipation 07/10/2020  . Sleep disturbance 07/10/2020  . Abnormality of gait 07/10/2020  . Insomnia 06/12/2020  . AKI (acute kidney injury) ([place])   . Steroid-induced hyperglycemia   . Controlled type 2 diabetes mellitus with hyperglycemia, without long-term current use of insulin ([place])   . Hyponatremia   . Hypoalbuminemia due to protein-calorie malnutrition ([place])   . Transaminitis   . Leucocytosis   . Acute on chronic anemia   . Stenosis of cervical spine with myelopathy ([place]) 05/04/2020  . S/P cervical spinal fusion 05/01/2020  . History of agent Orange exposure 04/05/2020  . Paresthesia 01/20/2020  . Health care maintenance 12/28/2017  . GERD (gastroesophageal reflux disease) 12/28/2017  . Medicare annual wellness visit, initial 10/30/2014  . Advance care planning 10/30/2014  . Right bundle branch block (RBBB)   . Wheezing 12/09/2013  . CKD (chronic kidney disease), stage III ([place]) 12/06/2013  . Back pain 10/20/2011  . Hypothyroidism 03/23/2009  . UNSPECIFIED VITAMIN D DEFICIENCY 03/23/2009  . Tremor 12/18/2006  . Coronary atherosclerosis 12/18/2006  . ADENOCARCINOMA, PROSTATE, HX OF 12/18/2006  . HLD (hyperlipidemia) 12/08/2005  . History of diabetes mellitus 02/07/2005  . Gout 09/09/1993  . Essential hypertension 09/09/1993   5:58 PM, 11/23/20 Josue Hector PT DPT  Physical Therapist with [facility]  [phone]   [facility] [address] [place], [place], [address] Phone: [phone]   Fax:  [phone]  Name: JAVEION CANNEDY MRN: [id number] Date of Birth: 1928/11/03

## 2020-11-28 ENCOUNTER — Encounter (HOSPITAL_COMMUNITY): Payer: Self-pay | Admitting: Physical Therapy

## 2020-11-28 ENCOUNTER — Ambulatory Visit (HOSPITAL_COMMUNITY): Payer: Medicare Other | Admitting: Physical Therapy

## 2020-11-28 ENCOUNTER — Other Ambulatory Visit: Payer: Self-pay

## 2020-11-28 DIAGNOSIS — M542 Cervicalgia: Secondary | ICD-10-CM

## 2020-11-28 DIAGNOSIS — R293 Abnormal posture: Secondary | ICD-10-CM

## 2020-11-29 NOTE — Therapy (Signed)
[facility] [address] [place], [place], [address] Phone: [phone]   Fax:  [phone]  Physical Therapy Treatment  Patient Details  Name: [name] [name] MRN: [id number] Date of Birth: [birth year] Referring Provider (PT): Sherley Bounds MD  PHYSICAL THERAPY DISCHARGE SUMMARY  Visits from Start of Care: 7  Current functional level related to goals / functional outcomes: See below    Remaining deficits: See below    Education / Equipment: See assessment  Plan: Patient agrees to discharge.  Patient goals were met. Patient is being discharged due to meeting the stated rehab goals.  ?????      Encounter Date: 11/28/2020   PT End of Session - 11/28/20 1649    Visit Number 7    Number of Visits 8    Date for PT Re-Evaluation 11/29/20    Authorization Type Medicare Part A/ T[employer] 2ndary    PT Start Time 1645    PT Stop Time 1730    PT Time Calculation (min) 45 min    Activity Tolerance Patient tolerated treatment well    Behavior During Therapy WFL for tasks assessed/performed           Past Medical History:  Diagnosis Date  . Acute renal failure ([facility]) 12/06/2013  . Back pain    occasionally  . Benign essential tremor 2005   takes Primidone daily  . Choledocholithiasis 12/06/2013  . Constipation    takes OTC stool softener  . Coronary artery disease 2007   s/p CABG, DR Johnsie Cancel  . GERD (gastroesophageal reflux disease)    takes Nexium daily  . Gout 1995   "~ once/yr" (03/17/2014)  . History of colon polyps   . Hyperlipidemia 12/2005   takes Atorvastatin daily  . Hypertension 1995   takes Lisinopril and Coreg daily  . possible HCAP (healthcare-associated pneumonia) 12/09/2013  . Prostate cancer ([facility]) 2007   S/P treatment, followed by Urology prev  . Right bundle branch block (RBBB)   . Stenosis of cervical spine   . Type II diabetes mellitus ([facility])    no meds;diet and exercise controlled   . Wears dentures   .  Wears glasses     Past Surgical History:  Procedure Laterality Date  . Adenosine myoview  01/24/2006   Ischemia by EKG, Cath  . CATARACT EXTRACTION W/ INTRAOCULAR LENS IMPLANT Left 10/2013  . CHOLECYSTECTOMY N/A 12/06/2013   Procedure: Diagnostic Laparoscopy for  drainage Intrabdominal abcess;  Surgeon: Rolm Bookbinder, MD;  Location: [facility];  Service: General;  Laterality: N/A;  . CHOLECYSTECTOMY N/A 03/17/2014   Procedure: LAPAROSCOPIC CHOLECYSTECTOMY ;  Surgeon: Rolm Bookbinder, MD;  Location: [facility];  Service: General;  Laterality: N/A;  . COLONOSCOPY    . CORONARY ARTERY BYPASS GRAFT  2007   CABGX 4  . CYSTOSCOPY  02/15/2004   Mod BPH, moder tribec ?  Marland Kitchen ERCP N/A 12/06/2013   Procedure: ENDOSCOPIC RETROGRADE CHOLANGIOPANCREATOGRAPHY (ERCP);  Surgeon: Milus Banister, MD;  Location: [facility];  Service: Endoscopy;  Laterality: N/A;  . LAPAROSCOPIC CHOLECYSTECTOMY  03/17/2014  . LUMBAR FUSION  02/2012   lumbar spine for spinal stenosis  . POSTERIOR CERVICAL FUSION/FORAMINOTOMY N/A 05/01/2020   Procedure: Posterior cervical fusion with lateral mass fixation - Cervical three - Cervical six, laminectomy Cervical three-six;  Surgeon: Eustace Moore, MD;  Location: [facility];  Service: Neurosurgery;  Laterality: N/A;  . PROSTATE CRYOABLATION  09/18/2005   prostate CA    There were no vitals  filed for this visit.   Subjective Assessment - 11/28/20 1648    Subjective Patient says he feels pretty good. He reports 75% improvement since starting therapy. Still has "same old" neck pain.    Pertinent History C3-6 cervical fusion/ laminectomy    Limitations Sitting;House hold activities    How long can you sit comfortably? 3-4 hours    Patient Stated Goals Be pain free    Currently in Pain? Yes    Pain Score 3     Pain Location Neck    Pain Orientation Posterior    Pain Descriptors / Indicators Aching    Pain Type Chronic pain    Pain Onset More than a month ago    Pain Frequency Constant     Aggravating Factors  positional, looking down    Pain Relieving Factors looking up    Effect of Pain on Daily Activities Limits             11/28/20 0001  Assessment  Medical Diagnosis Cervicalgia  Referring Provider (PT) Sherley Bounds MD  Onset Date/Surgical Date 05/01/20  Prior Therapy Yes  Balance Screen  Has the patient fallen in the past 6 months Yes  How many times? 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  Overall Cognitive Status Within Functional Limits for tasks assessed  Posture/Postural Control  Posture/Postural Control Postural limitations  Postural Limitations Rounded Shoulders;Forward head  AROM  Cervical Flexion 40 (was 3)  Cervical Extension 31 (same)  Cervical - Right Rotation 55 (was 50)  Cervical - Left Rotation 50 (was 40)      11/28/20 0001  Posture/Postural Control  Posture/Postural Control Postural limitations  Postural Limitations Rounded Shoulders;Forward head  Neck Exercises: Seated  Neck Retraction 10 reps;5 secs  Other Seated Exercise scapular retraction x10  Other Seated Exercise cervical excursions x 10 each  Manual Therapy  Manual Therapy Soft tissue mobilization  Manual therapy comments Performed separate from all other activity  Soft tissue mobilization IASTM to bilateral upper trap, levator, cervical paraspinals with patient seated  Neck Exercises: Stretches  Upper Trapezius Stretch Right;Left;3 reps;30 seconds      PT Short Term Goals - 11/29/20 1743      PT SHORT TERM GOAL #1   Title Patient will be independent with initial HEP and self-management strategies to improve functional outcomes    Baseline Reports compliance, demos good return    Time 2    Period Weeks    Status Achieved    Target Date 11/15/20             PT Long Term Goals - 11/29/20 1743       PT LONG TERM GOAL #1   Title Patient will report at least 70% overall improvement in subjective complaint to indicate improvement in ability to perform ADLs.    Baseline Reports 75%    Time 4    Period Weeks    Status Achieved      PT LONG TERM GOAL #2   Title Patient improve LT cervical rotation by 10 degrees in order to improve ability to scan environment for safety and while driving.    Baseline See AROM    Time 4    Period Weeks    Status Achieved      PT LONG TERM GOAL #3   Title Patient will be able to sit  comfortably for >1 hour with no increased neck pain for improved ability sitting in car, watching TV, reading, having dinner    Baseline Can sit several hours with no increased pain    Time 4    Period Weeks    Status Achieved                 Plan - 11/28/20 1750    Clinical Impression Statement Patient has made good progress toward therapy goals. He shows significant improvement in cervical AROM, but continues to be limited by poor posturing. He has made slight improvement with subjective complaint of pain, but ongoing pain likely related to posturing. Have spoken at length about postural awareness and improving posture as this is likely the primary cause of ongoing pain. Patient shows good return with exercise and has currently met all objective therapy goals. Discussed transition to comprehensive HEP for DC today and see how postural adjustments improve in about a month. Patient encouraged to follow up with therapy services with any further questions or concerns.    Personal Factors and Comorbidities Age    Examination-Activity Limitations Reach Overhead;Sit;Lift;Other;Hygiene/Grooming;Carry    Examination-Participation Restrictions Art gallery manager;Yard Work    Stability/Clinical Decision Making Stable/Uncomplicated    Rehab Potential Good    PT Treatment/Interventions ADLs/Self Care Home Management;Ultrasound;Neuromuscular re-education;Parrafin;Compression  bandaging;Visual/perceptual remediation/compensation;Fluidtherapy;Scar mobilization;Passive range of motion;Patient/family education;Contrast Bath;Aquatic Therapy;Biofeedback;DME Instruction;Canalith Repostioning;Gait training;Orthotic Fit/Training;Dry needling;Spinal Manipulations;Joint Manipulations;Energy conservation;Stair training;Cryotherapy;Electrical Stimulation;Functional mobility training;Splinting;Taping;Vasopneumatic Device;Therapeutic activities;Manual techniques;Iontophoresis 4mg /ml Dexamethasone;Therapeutic exercise;Moist Heat;Traction;Balance training;Manual lymph drainage;Vestibular    PT Next Visit Plan DC to HEP    PT Home Exercise Plan Eval: chin tuck, scapular retraction, cervical rotation AROM 3/3 UT stretch 3/8 band decompression    Consulted and Agree with Plan of Care Patient           Patient will benefit from skilled therapeutic intervention in order to improve the following deficits and impairments:  Impaired flexibility,Postural dysfunction,Decreased range of motion,Improper body mechanics,Impaired UE functional use,Pain,Increased fascial restricitons,Decreased strength  Visit Diagnosis: Cervicalgia  Abnormal posture     Problem List Patient Active Problem List   Diagnosis Date Noted  . Slow transit constipation 07/10/2020  . Sleep disturbance 07/10/2020  . Abnormality of gait 07/10/2020  . Insomnia 06/12/2020  . AKI (acute kidney injury) ([facility])   . Steroid-induced hyperglycemia   . Controlled type 2 diabetes mellitus with hyperglycemia, without long-term current use of insulin ([facility])   . Hyponatremia   . Hypoalbuminemia due to protein-calorie malnutrition ([facility])   . Transaminitis   . Leucocytosis   . Acute on chronic anemia   . Stenosis of cervical spine with myelopathy ([facility]) 05/04/2020  . S/P cervical spinal fusion 05/01/2020  . History of agent Orange exposure 04/05/2020  . Paresthesia 01/20/2020  . Health care maintenance 12/28/2017  . GERD  (gastroesophageal reflux disease) 12/28/2017  . Medicare annual wellness visit, initial 10/30/2014  . Advance care planning 10/30/2014  . Right bundle branch block (RBBB)   . Wheezing 12/09/2013  . CKD (chronic kidney disease), stage III ([facility]) 12/06/2013  . Back pain 10/20/2011  . Hypothyroidism 03/23/2009  . UNSPECIFIED VITAMIN D DEFICIENCY 03/23/2009  . Tremor 12/18/2006  . Coronary atherosclerosis 12/18/2006  . ADENOCARCINOMA, PROSTATE, HX OF 12/18/2006  . HLD (hyperlipidemia) 12/08/2005  . History of diabetes mellitus 02/07/2005  . Gout 09/09/1993  . Essential hypertension 09/09/1993    5:53 PM, 11/29/20 Josue Hector PT DPT  Physical Therapist with [facility]  [phone]   [facility]  [place], [place], [address] Phone: [phone]   Fax:  [phone]  Name: ZAYIN VALADEZ MRN: [id number] Date of Birth: Dec 02, 1928

## 2020-11-29 NOTE — Progress Notes (Signed)
   11/28/20 0001  Assessment  Medical Diagnosis Cervicalgia  Referring Provider (PT) Sherley Bounds MD  Onset Date/Surgical Date 05/01/20  Prior Therapy Yes  Balance Screen  Has the patient fallen in the past 6 months Yes  How many times? 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

## 2020-11-29 NOTE — Progress Notes (Signed)
   11/28/20 0001  Posture/Postural Control  Posture/Postural Control Postural limitations  Postural Limitations Rounded Shoulders;Forward head  Neck Exercises: Seated  Neck Retraction 10 reps;5 secs  Other Seated Exercise scapular retraction x10  Other Seated Exercise cervical excursions x 10 each  Manual Therapy  Manual Therapy Soft tissue mobilization  Manual therapy comments Performed separate from all other activity  Soft tissue mobilization IASTM to bilateral upper trap, levator, cervical paraspinals with patient seated  Neck Exercises: Stretches  Upper Trapezius Stretch Right;Left;3 reps;30 seconds

## 2020-11-30 ENCOUNTER — Ambulatory Visit (HOSPITAL_COMMUNITY): Payer: Medicare Other | Admitting: Physical Therapy

## 2020-12-05 ENCOUNTER — Encounter (HOSPITAL_COMMUNITY): Payer: Medicare Other | Admitting: Physical Therapy

## 2020-12-07 ENCOUNTER — Encounter (HOSPITAL_COMMUNITY): Payer: Medicare Other | Admitting: Physical Therapy

## 2020-12-12 ENCOUNTER — Encounter (HOSPITAL_COMMUNITY): Payer: Medicare Other | Admitting: Physical Therapy

## 2020-12-14 ENCOUNTER — Ambulatory Visit (HOSPITAL_COMMUNITY): Payer: Medicare Other | Admitting: Physical Therapy

## 2020-12-21 DIAGNOSIS — M542 Cervicalgia: Secondary | ICD-10-CM | POA: Diagnosis not present

## 2020-12-21 DIAGNOSIS — I10 Essential (primary) hypertension: Secondary | ICD-10-CM | POA: Diagnosis not present

## 2020-12-22 ENCOUNTER — Other Ambulatory Visit: Payer: Self-pay | Admitting: *Deleted

## 2020-12-22 NOTE — Patient Outreach (Signed)
[facility]) Care Management  12/22/2020  [name] [name] 06/23/1929 [id number]   Telephone Assessment-Unsuccessful  RN attempted outreach call today however unsuccessful. RN able to leave a HIPAA approved voice message requesting a call back.  Will follow up next week with another outreach call for ongoing [facility] services.  [name], RN Care Management Coordinator [facility] Office [phone]

## 2020-12-27 ENCOUNTER — Other Ambulatory Visit: Payer: Self-pay | Admitting: *Deleted

## 2020-12-27 NOTE — Patient Outreach (Signed)
[facility]) Care Management  12/27/2020  PRESS CASALE 1929-05-10 [id number]   Telephone Assessment-Successful-HTN  RN spoke with pt's daughter [name]) today with update on pt's ongoing management of care. Reports pt's blood pressures have been good with no elevations. Pt remains adherent to the care plan that was reviewed and discussed with updated notes. Pt continue to attend all medical appointments and has refills on all his prescription medications. Daughter [name] is his main source of transportation however difficult at times. RN offered an alternate form of transportation via [employer] services however declined at this time.   Will follow up next month with pt concerning his ongoing management of care and continue to encouraged pt on his goals and interventions to prevent acute events from occurring.    Goals Addressed            This Visit's Progress   . [facility] My Quality of Life   On track    Follow Up Date 01/25/2021 Timeframe:  Long-Range Goal Priority:  Medium Start Date:   11/22/2020                          Expected End Date:     03/08/2021                  - complete a living will - do one enjoyable thing every day    Why is this important?    Having a long-term illness can be scary.   It can also be stressful for you and your caregiver.   These steps may help.    Notes: 3/16-Discussed upcoming birthday pt will be 87 years old. Discuss ongoing health measures to prevent acute issues from occurring such as monitoring his ongoing blood pressures and ability to recognize potential symptoms if present. Pt is aware with HTN there maybe no signs present so maintaining a health diet, exercises and taking his blood pressure medication with assist and lower this risk and improve his ongoing quality of life. Discuss ongoing support daughter [name].     Baker Pierini and Manage My Blood Pressure   On track    Follow Up Date: 01/25/2021 Timeframe:   Long-term Goal Priority:  Medium Start Date:     09/18/2020                        Expected End Date:  04/06/2021                     - check blood pressure weekly - choose a place to take my blood pressure (home, clinic or office, retail store) - write blood pressure results in a log or diary    Why is this important?   You won't feel high blood pressure, but it can still hurt your blood vessels.  High blood pressure can cause heart or kidney problems. It can also cause a stroke.  Making lifestyle changes like losing a little weight or eating less salt will help.  Checking your blood pressure at home and at different times of the day can help to control blood pressure.  If the doctor prescribes medicine remember to take it the way the doctor ordered.  Call the office if you cannot afford the medicine or if there are questions about it.     Notes: 4/20-Pt continues to do well as reported by his caregiver daughter today [name]). Pt reports his blood  pressures 135/65. No reported issues as pt continues to follow the plan of care. Feb-Pt reports updated readings on his blood pressure 140/80 at his provider's office this week. Pt continue to deny any reported symptoms. Again pt prefers to obtain his blood pressures readings from his providers office and does wish to have a home device for ongoing monitoring.  Jan- Pt prefers office visit monthly and reports those readings accordingly to  this RN. Most recent read 110/70 last visit pending another visit 10/24/2020. Pt declined home device for self monitoring. Will continue to encouraged adherence with education on possible symptom management.        [name], RN Care Management Coordinator [place] Office [phone]

## 2020-12-28 ENCOUNTER — Other Ambulatory Visit: Payer: Self-pay

## 2020-12-28 MED ORDER — TRAZODONE HCL 50 MG PO TABS: ORAL_TABLET | ORAL | 1 refills | Status: DC

## 2020-12-29 ENCOUNTER — Telehealth: Payer: Self-pay

## 2020-12-29 DIAGNOSIS — U071 COVID-19: Secondary | ICD-10-CM

## 2020-12-29 NOTE — Telephone Encounter (Signed)
Spoke to patients daughter [name] (okay per DPR). Patients sx started yesterday and he has had 3 covid shots. I have updated his chart.

## 2020-12-29 NOTE — Telephone Encounter (Signed)
Noted, referral placed for Covid infusion.

## 2020-12-29 NOTE — Addendum Note (Signed)
Addended by: Pleas Koch on: 12/29/2020 04:04 PM   Modules accepted: Orders

## 2020-12-29 NOTE — Telephone Encounter (Signed)
Pt was exposed to daughter who had Covid this week and now pt is positive. His symptoms so far is stuffy nose, sore throat. Fever 100.3. Asking what they need to do and if his age and health issues would allow him to qualify for the infusion or medications. Please advised SIL Coralyn Mark at [phone]

## 2020-12-29 NOTE — Telephone Encounter (Signed)
Yes, he qualifies for treatment.  Need to know:  1. Date of symptom onset. 2. # of Covid vaccines, I see 2 in chart.  Will put in referral for infusion once I hear back. He should receive a phone call within the next day or 2.

## 2020-12-30 ENCOUNTER — Telehealth: Payer: Self-pay | Admitting: Unknown Physician Specialty

## 2020-12-30 NOTE — Telephone Encounter (Signed)
Called to discuss with patient about COVID-19 symptoms and the use of one of the available treatments for those with mild to moderate Covid symptoms and at a high risk of hospitalization.  Pt appears to qualify for outpatient treatment due to co-morbid conditions and/or a member of an at-risk group in accordance with the FDA Emergency Use Authorization.    Symptom onset: 4/22 Vaccinated: yes Booster? yes Immunocompromised? no Qualifiers: Multiple co-morbid and age  Called daughter.  No answer  Kathrine Haddock

## 2020-12-31 ENCOUNTER — Other Ambulatory Visit: Payer: Self-pay | Admitting: Infectious Diseases

## 2020-12-31 DIAGNOSIS — U071 COVID-19: Secondary | ICD-10-CM

## 2020-12-31 NOTE — Telephone Encounter (Signed)
I spoke with [name] -   She had this done on Thursday for her and would like to get this for her father.   He is weak but eating as best as he can. Largest symptom at this time is runny nose, no cough.  Sx onset 4/22. Will place him on list to be scheduled for monoclonal infusion.    [name], MSN, NP-C [facility] [email or web address] Pager: [phone] Office: [place]: [phone]

## 2020-12-31 NOTE — Progress Notes (Signed)
I connected by phone with [name] [name] on 12/31/2020 at 1:03 PM to discuss the potential use of a new treatment for mild to moderate COVID-19 viral infection in non-hospitalized patients.  This patient is a 87 y.o. male that meets the FDA criteria for Emergency Use Authorization of COVID monoclonal antibody bebtelovimab.  Has a (+) direct SARS-CoV-2 viral test result  Has mild or moderate COVID-19   Is NOT hospitalized due to COVID-19  Is within 10 days of symptom onset  Has at least one of the high risk factor(s) for progression to severe COVID-19 and/or hospitalization as defined in EUA.  Specific high risk criteria : Older age (>/= 85 yo)   I have spoken and communicated the following to the patient or parent/caregiver regarding COVID monoclonal antibody treatment:  1. FDA has authorized the emergency use for the treatment of mild to moderate COVID-19 in adults and pediatric patients with positive results of direct SARS-CoV-2 viral testing who are 20 years of age and older weighing at least 40 kg, and who are at high risk for progressing to severe COVID-19 and/or hospitalization.  2. The significant known and potential risks and benefits of COVID monoclonal antibody, and the extent to which such potential risks and benefits are unknown.  3. Information on available alternative treatments and the risks and benefits of those alternatives, including clinical trials.  4. Patients treated with COVID monoclonal antibody should continue to self-isolate and use infection control measures (e.g., wear mask, isolate, social distance, avoid sharing personal items, clean and disinfect "high touch" surfaces, and frequent handwashing) according to CDC guidelines.   5. The patient or parent/caregiver has the option to accept or refuse COVID monoclonal antibody treatment.  6. Discussion about the monoclonal antibody infusion does not ensure treatment. The patient will be placed on a list and  scheduled according to risk, symptom onset and availability. A scheduler will reach to the patient to let them know if we can accommodate their infusion or not.  After reviewing this information with the patient, the patient has agreed to receive one of the available covid 19 monoclonal antibodies and will be provided an appropriate fact sheet prior to infusion. [name], NP 12/31/2020 1:03 PM

## 2021-01-01 ENCOUNTER — Ambulatory Visit (INDEPENDENT_AMBULATORY_CARE_PROVIDER_SITE_OTHER): Payer: Medicare Other | Admitting: *Deleted

## 2021-01-01 ENCOUNTER — Other Ambulatory Visit: Payer: Self-pay | Admitting: Family Medicine

## 2021-01-01 ENCOUNTER — Other Ambulatory Visit: Payer: Self-pay

## 2021-01-01 DIAGNOSIS — U071 COVID-19: Secondary | ICD-10-CM

## 2021-01-01 MED ORDER — ALBUTEROL SULFATE HFA 108 (90 BASE) MCG/ACT IN AERS: 2.0000 | INHALATION_SPRAY | Freq: Once | RESPIRATORY_TRACT | Status: AC | PRN

## 2021-01-01 MED ORDER — BEBTELOVIMAB 175 MG/2 ML IV (EUA)
175.0000 mg | Freq: Once | INTRAMUSCULAR | Status: AC
  Administered 2021-01-01: 175 mg via INTRAVENOUS
  Filled 2021-01-01: qty 2

## 2021-01-01 MED ORDER — EPINEPHRINE 0.3 MG/0.3ML IJ SOAJ: 0.3000 mg | Freq: Once | INTRAMUSCULAR | Status: AC | PRN

## 2021-01-01 MED ORDER — SODIUM CHLORIDE 0.9 % IV SOLN
INTRAVENOUS | Status: DC | PRN
Start: 2021-01-01 — End: 2021-06-22

## 2021-01-01 MED ORDER — DIPHENHYDRAMINE HCL 50 MG/ML IJ SOLN: 50.0000 mg | Freq: Once | INTRAMUSCULAR | Status: AC | PRN

## 2021-01-01 MED ORDER — METHYLPREDNISOLONE SODIUM SUCC 125 MG IJ SOLR: 125.0000 mg | Freq: Once | INTRAMUSCULAR | Status: AC | PRN

## 2021-01-01 MED ORDER — FAMOTIDINE IN NACL 20-0.9 MG/50ML-% IV SOLN: 20.0000 mg | Freq: Once | INTRAVENOUS | Status: AC | PRN

## 2021-01-01 NOTE — Progress Notes (Signed)
Diagnosis: covid  Provider:  Marshell Garfinkel, MD  Procedure: Infusion  IV Type: Peripheral, IV Location: R Forearm  Entyvio (Vedolizumab), Dose: 175  Infusion Start Time: 1430  Infusion Stop Time: 7342  Post Infusion IV Care: Observation period completed  Discharge: Condition: Good, Destination: Home . AVS provided to patient.   Performed by:  Bonnye Fava, RN

## 2021-01-01 NOTE — Patient Instructions (Signed)
10 Things You Can Do to Manage Your COVID-19 Symptoms at Home If you have possible or confirmed COVID-19: 1. Stay home except to get medical care. 2. Monitor your symptoms carefully. If your symptoms get worse, call your healthcare provider immediately. 3. Get rest and stay hydrated. 4. If you have a medical appointment, call the healthcare provider ahead of time and tell them that you have or may have COVID-19. 5. For medical emergencies, call 911 and notify the dispatch personnel that you have or may have COVID-19. 6. Cover your cough and sneezes with a tissue or use the inside of your elbow. 7. Wash your hands often with soap and water for at least 20 seconds or clean your hands with an alcohol-based hand sanitizer that contains at least 60% alcohol. 8. As much as possible, stay in a specific room and away from other people in your home. Also, you should use a separate bathroom, if available. If you need to be around other people in or outside of the home, wear a mask. 9. Avoid sharing personal items with other people in your household, like dishes, towels, and bedding. 10. Clean all surfaces that are touched often, like counters, tabletops, and doorknobs. Use household cleaning sprays or wipes according to the label instructions. cdc.gov/coronavirus 03/24/2020 This information is not intended to replace advice given to you by your health care provider. Make sure you discuss any questions you have with your health care provider. Document Revised: 07/10/2020 Document Reviewed: 07/10/2020 Elsevier Patient Education  2021 Elsevier Inc.  What types of side effects do monoclonal antibody drugs cause?  Common side effects  In general, the more common side effects caused by monoclonal antibody drugs include: . Allergic reactions, such as hives or itching . Flu-like signs and symptoms, including chills, fatigue, fever, and muscle aches and pains . Nausea, vomiting . Diarrhea . Skin  rashes . Low blood pressure   The CDC is recommending patients who receive monoclonal antibody treatments wait at least 90 days before being vaccinated.  Currently, there are no data on the safety and efficacy of mRNA COVID-19 vaccines in persons who received monoclonal antibodies or convalescent plasma as part of COVID-19 treatment. Based on the estimated half-life of such therapies as well as evidence suggesting that reinfection is uncommon in the 90 days after initial infection, vaccination should be deferred for at least 90 days, as a precautionary measure until additional information becomes available, to avoid interference of the antibody treatment with vaccine-induced immune responses.  

## 2021-01-06 ENCOUNTER — Other Ambulatory Visit: Payer: Self-pay | Admitting: Physical Medicine & Rehabilitation

## 2021-01-11 DIAGNOSIS — M7918 Myalgia, other site: Secondary | ICD-10-CM | POA: Diagnosis not present

## 2021-01-11 DIAGNOSIS — I10 Essential (primary) hypertension: Secondary | ICD-10-CM | POA: Diagnosis not present

## 2021-01-11 DIAGNOSIS — M542 Cervicalgia: Secondary | ICD-10-CM | POA: Diagnosis not present

## 2021-01-18 ENCOUNTER — Telehealth: Payer: Self-pay

## 2021-01-18 NOTE — Telephone Encounter (Signed)
Spoke with the patient, he is aware of Dr. Josefine Class recommendations. Nothing further needed.

## 2021-01-18 NOTE — Telephone Encounter (Signed)
Patients daughter called and stated that Tuesday the patient began experiencing diarrhea that improved, but got worse last night. As a result, patient has experienced weakness and abdominal pain. Patient denied fever, chills, or N/V. Patient has continued to take stool softeners despite diarrhea. Instructed patients daughter to have the patient drink plenty of fluids and try to avoid stool softeners or laxatives. Instructed daughter that if patients symptoms do not improve or worsen, he should report to ED. Patients daughter verbalized understanding. Please advise.

## 2021-01-18 NOTE — Telephone Encounter (Addendum)
With no fevers, would rec: inc in fluids, stop stool softeners, and eat a bland diet.  If worse or not better then call the triage line here.  If severe abd pain then to ER.  Thanks.

## 2021-01-25 ENCOUNTER — Other Ambulatory Visit: Payer: Self-pay | Admitting: *Deleted

## 2021-01-25 NOTE — Patient Outreach (Signed)
[facility]) Care Management  01/25/2021  [name] [name] December 06, 1928 [id number]  Telephone Assessment-Successful-HTN  RN spoke with daughter in the presence of the patient. Reports pt is doing well with his birthday yesterday (87 y.o). Denies any related symptoms of elevated blood pressures and no reported issues since the last conversation last month. Pt visits several providers throughout the month and does not really take his blood pressures that regularly but obtains his readings from his provider offices. Pt decline BP devices at this time and in the past and aware of what to do if acute symptoms should past.   Plan of acre reviewed and discussed with ongoing encouragement of adherence with managing his HTN and all discussed today. Pt adherent to all medications with needed refills or needs at this time. Daughter interested in quarterly calls and has agreed to another outreach next month. Will update pt's provider at that time and change pt to quarterly outreach.  Goals Addressed            This Visit's Progress   . [facility]-Matintain My Quality of Life   On track    Follow Up Date 02/22/2021 Timeframe:  Long-Range Goal Priority:  Medium Start Date:   11/22/2020                          Expected End Date:     03/08/2021                  - complete a living will - do one enjoyable thing every day   Barriers: Health Behaviors  Why is this important?    Having a long-term illness can be scary.   It can also be stressful for you and your caregiver.   These steps may help.    Notes: 3/16-Discussed upcoming birthday pt will be 87 years old. Discuss ongoing health measures to prevent acute issues from occurring such as monitoring his ongoing blood pressures and ability to recognize potential symptoms if present. Pt is aware with HTN there maybe no signs present so maintaining a health diet, exercises and taking his blood pressure medication with assist and lower this  risk and improve his ongoing quality of life. Discuss ongoing support daughter [name] [name].     Baker Pierini and Manage My Blood Pressure   On track    Follow Up Date: 02/22/2021 Timeframe:  Long-term Goal Priority:  Medium Start Date:     09/18/2020                        Expected End Date:  04/06/2021                     - check blood pressure weekly - choose a place to take my blood pressure (home, clinic or office, retail store) - write blood pressure results in a log or diary   Barriers: Health Behaviors  Why is this important?   You won't feel high blood pressure, but it can still hurt your blood vessels.  High blood pressure can cause heart or kidney problems. It can also cause a stroke.  Making lifestyle changes like losing a little weight or eating less salt will help.  Checking your blood pressure at home and at different times of the day can help to control blood pressure.  If the doctor prescribes medicine remember to take it the way the doctor ordered.  Call the  office if you cannot afford the medicine or if there are questions about it.     Notes: 4/20-Pt continues to do well as reported by his caregiver daughter today [name] [name]). Pt reports his blood pressures 135/65. No reported issues as pt continues to follow the plan of care. Feb-Pt reports updated readings on his blood pressure 140/80 at his provider's office this week. Pt continue to deny any reported symptoms. Again pt prefers to obtain his blood pressures readings from his providers office and does wish to have a home device for ongoing monitoring.  Jan- Pt prefers office visit monthly and reports those readings accordingly to  this RN. Most recent read 110/70 last visit pending another visit 10/24/2020. Pt declined home device for self monitoring. Will continue to encouraged adherence with education on possible symptom management.        [name], RN Care Management Coordinator [place] Office  [phone]

## 2021-02-20 NOTE — Progress Notes (Signed)
Diagnosis: covid  Provider:  Marshell Garfinkel, MD  Procedure: Infusion  IV Type: Peripheral, IV Location: R Forearm  Entyvio (Vedolizumab), Dose: 175  Infusion Start Time: 1430  Infusion Stop Time: 9038  Post Infusion IV Care: Observation period completed  Discharge: Condition: Good, Destination: Home . AVS provided to patient.   Performed by:  Rosita Kea RN

## 2021-02-22 ENCOUNTER — Other Ambulatory Visit: Payer: Self-pay | Admitting: *Deleted

## 2021-02-22 NOTE — Patient Outreach (Signed)
[facility]) Care Management  02/22/2021  CLAUDIUS MICH 09/12/1928 [id number]   Telephone Assessment-Unsuccessful  RN attempted outreach call today however unsuccessful. RN able to leave a HIPAA approved voice message requesting a call back.  Will rescheduled another outreach over the next week.  [name], RN Care Management Coordinator [place] Office [phone]

## 2021-03-01 ENCOUNTER — Other Ambulatory Visit: Payer: Self-pay | Admitting: *Deleted

## 2021-03-01 NOTE — Patient Outreach (Signed)
[facility] [facility]) Care Management  03/01/2021  OTHER ATIENZA 03-21-1929 [id number]   Telephone Assessment-Unsuccessful  RN attempted outreach call today however unsuccessful. RN able to leave a HIPAA approved voice message requesting a call back.  Will send outreach letter and follow up once again next month for ongoing [facility] services.  [name], RN Care Management Coordinator [place] Office [phone]

## 2021-03-15 ENCOUNTER — Telehealth: Payer: Self-pay

## 2021-03-15 NOTE — Telephone Encounter (Signed)
Triage vm from Josiah Lobo (? Pt's granddaughter; not on dpr) stating she is concerned about pt retaining fluid in his feet.  She requests a call back at [phone].

## 2021-03-15 NOTE — Telephone Encounter (Signed)
Called patient back and he states he has had swelling in this feet for months. No redness or pain but does leave an indention when touches them. He does not take any fluid pills. There are no openings with any provider in the office tomorrow. Please advise.

## 2021-03-16 NOTE — Telephone Encounter (Signed)
Please schedule him as soon as possible.  Please verify that is not having chest pain or shortness of breath in the meantime.  If he is then he needs to be evaluated urgently.  I would limit salt in the meantime and try to elevate his feet when possible.  Thanks.

## 2021-03-16 NOTE — Telephone Encounter (Signed)
Patient doing fine now; swelling down and indention's no longer occurring when touched. Advised patient if occurs again to be seen urgently in office or UC if can not be seen here. Patient has not had any chest pains or SOB.

## 2021-03-18 NOTE — Telephone Encounter (Addendum)
Noted.  Thanks.  If he has any recurrence then I want to see him back in clinic.

## 2021-03-19 ENCOUNTER — Other Ambulatory Visit: Payer: Self-pay | Admitting: Family Medicine

## 2021-03-19 NOTE — Telephone Encounter (Signed)
Left message to return call to our office.  

## 2021-03-20 ENCOUNTER — Other Ambulatory Visit: Payer: Self-pay | Admitting: Family Medicine

## 2021-03-21 IMAGING — MR MR CERVICAL SPINE W/O CM
5 of 6 series · 25 of 48 positions shown · non-contrast
Comparison: MRI 03/29/2020

CLINICAL DATA: Spinal cord injury, follow-up left arm weakness 48
hours after surgery.

EXAM:
MRI CERVICAL SPINE WITHOUT CONTRAST
TECHNIQUE: Multiplanar, multisequence MR imaging of the cervical spine was
performed. No intravenous contrast was administered.

[Series 5: T2 · sagittal · 3.0mm · 0.69mm/px · 3 of 15 slices shown (1 of 2)]
[im 1/15]
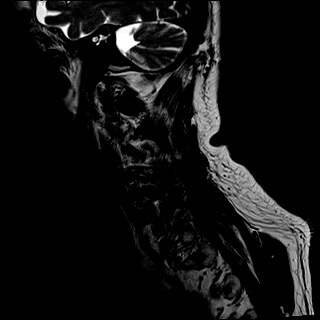
[im 8/15]
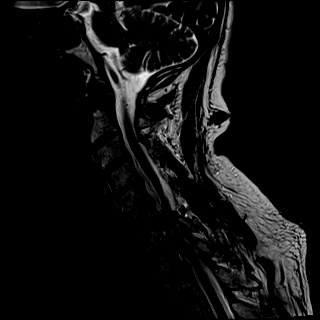
[im 15/15]
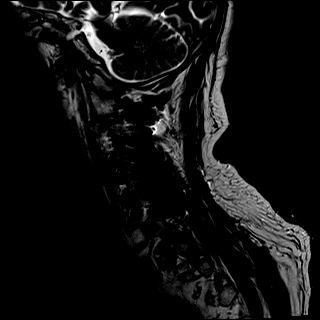

[Series 6: T1 · sagittal · 3.0mm · 0.69mm/px · 3 of 15 slices shown]
[im 1/15]
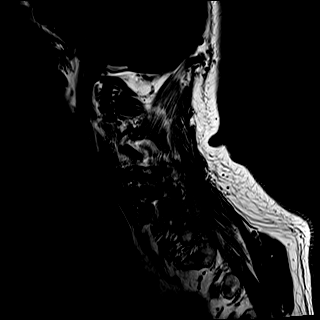
[im 8/15]
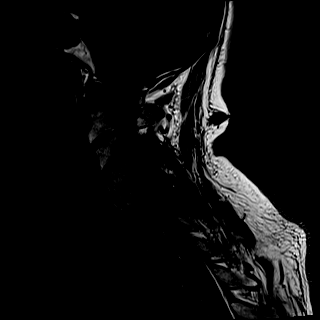
[im 15/15]
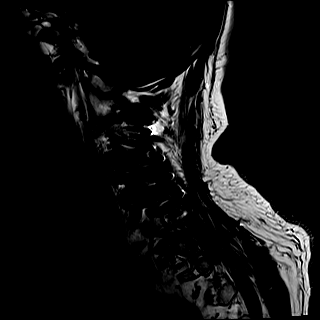

[Series 7: STIR · sagittal · 3.0mm · 0.86mm/px · 3 of 15 slices shown]
[im 1/15]
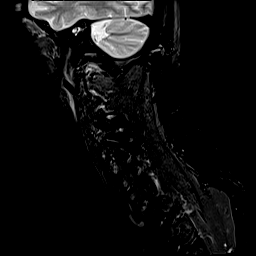
[im 8/15]
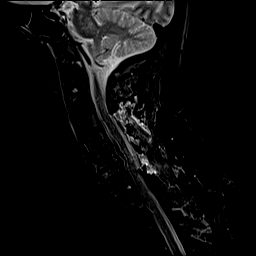
[im 15/15]
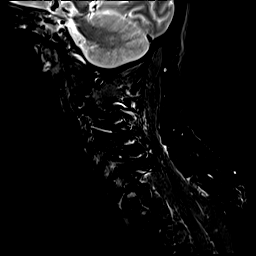

[Series 8: T2 · axial · 3.0mm · 0.66mm/px · z∈[-181,-73]mm · 8 of 38 slices shown (2 of 2)]
[im 1/38]
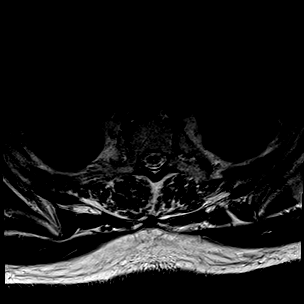
[im 6/38]
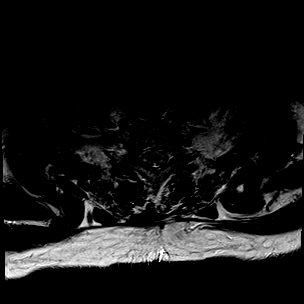
[im 11/38]
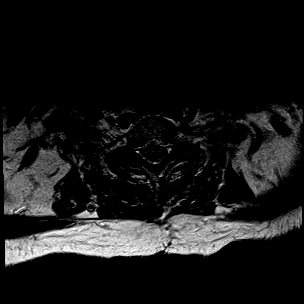
[im 16/38]
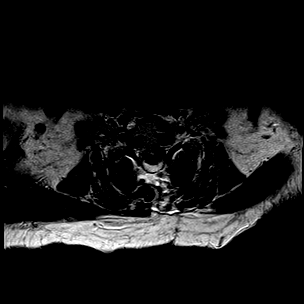
[im 22/38]
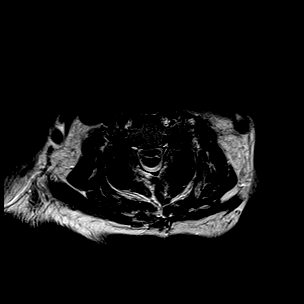
[im 27/38]
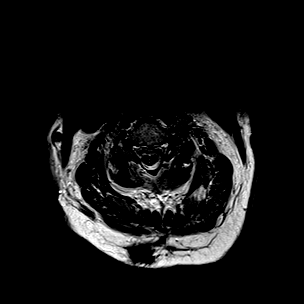
[im 32/38]
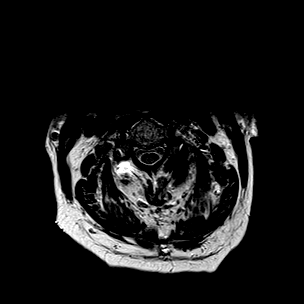
[im 38/38]
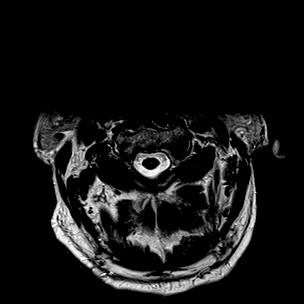

[Series 9: GRE · axial · 3.0mm · 0.39mm/px · z∈[-181,-73]mm · 8 of 38 slices shown]
[im 1/38]
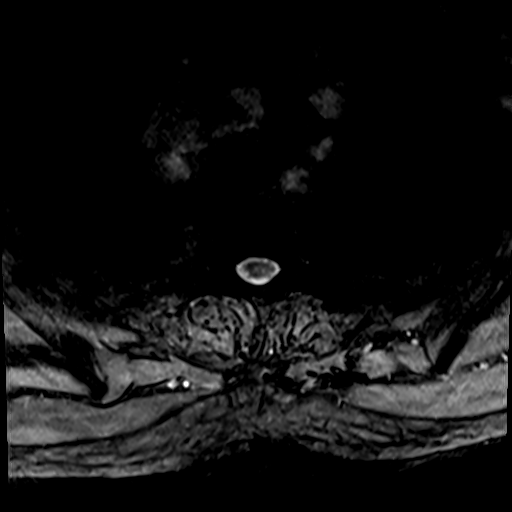
[im 6/38]
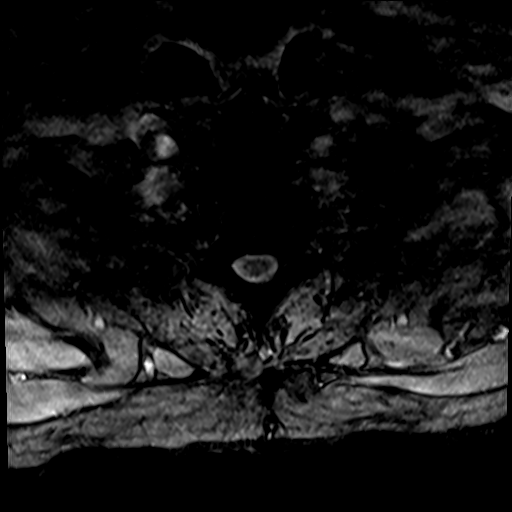
[im 11/38]
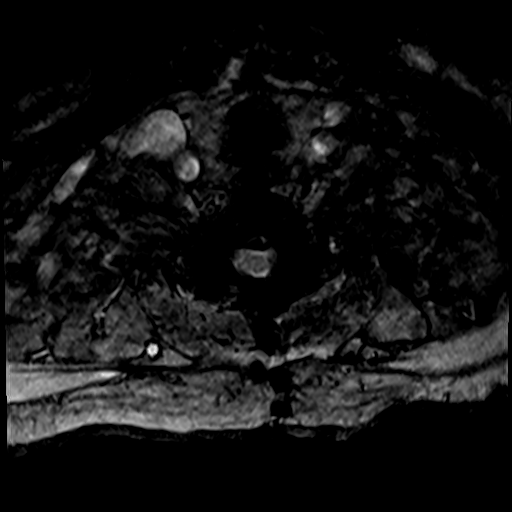
[im 16/38]
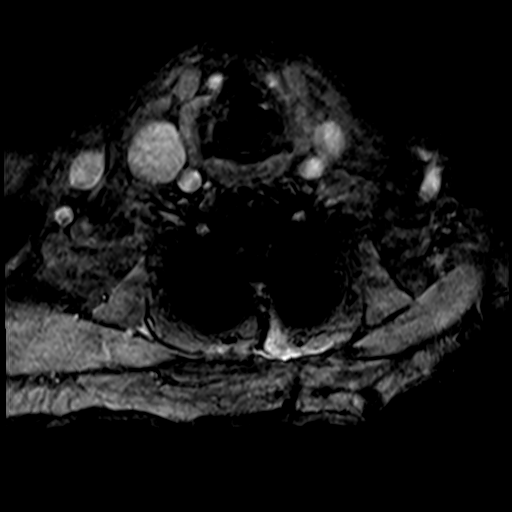
[im 22/38]
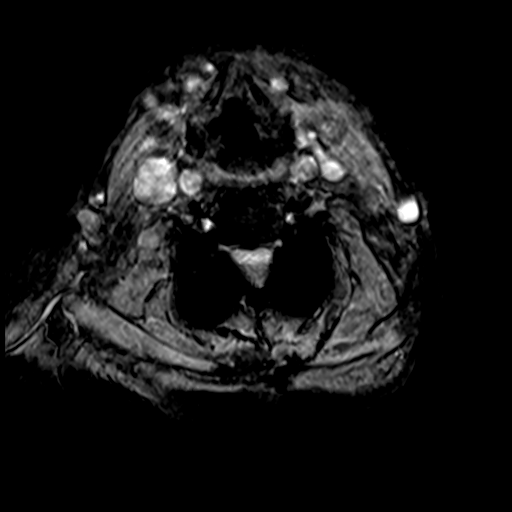
[im 27/38]
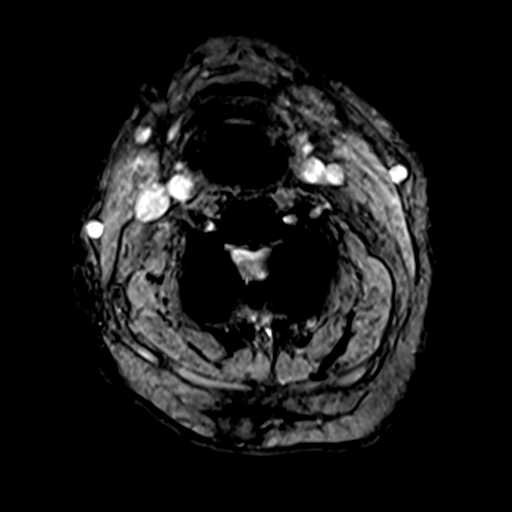
[im 32/38]
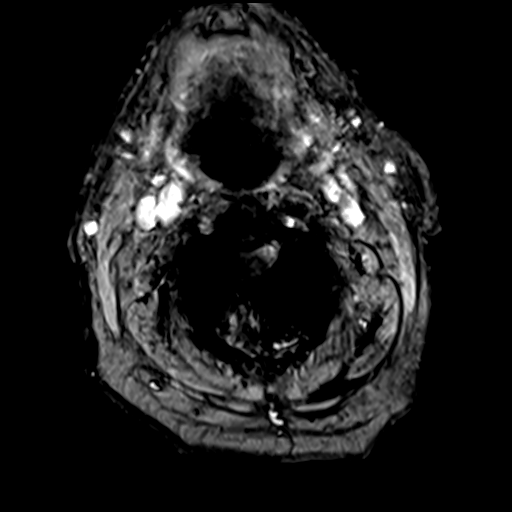
[im 38/38]
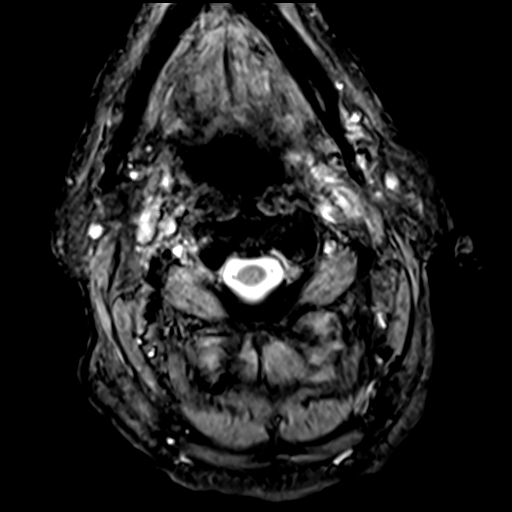

[25 of 48 positions shown; findings below may reference images not displayed]

FINDINGS: Alignment: Straightening of the normal cervical lordosis. Similar
trace anterolisthesis of C7 on T1.

Vertebrae: Status post posterior rod screw fixation at C3 through C6
with hardware better characterized on recent radiographs. No new
vertebral body height loss or new bone marrow edema to suggest acute
fracture. Multilevel degenerative discogenic endplate signal
changes, most pronounced at C5-C6

Cord: Redemonstrated mild T2/STIR hyperintense signal within the
cord at the C5-C6 level with mild cord volume loss, compatible with
myelomalacia related to prior canal stenosis.

Posterior Fossa, vertebral arteries, paraspinal tissues: Extensive
edema within the paraspinal musculature spanning C2-C3 through
C6-C7, compatible with postoperative changes.

Disc levels:

C2-C3: No significant canal or foraminal stenosis.

C3-C4: Status post posterior fixation and decompression without
significant residual canal stenosis. Similar moderate left greater
than right foraminal stenosis secondary to bilateral uncovertebral
and facet hypertrophy.

C4-C5: Status post posterior fixation and decompression without
significant residual canal stenosis. Similar bilateral facet and
uncovertebral hypertrophy with severe bilateral foraminal stenosis.

C5-C6: Status post posterior fixation and decompression without
significant residual canal stenosis. Similar bilateral uncovertebral
and facet hypertrophy with moderate to severe bilateral foraminal
stenosis.

C6-C7: Posterior disc osteophyte complex with superimposed left
paracentral disc herniation. Overall mild-to-moderate canal
stenosis, likely similar to prior when accounting for differences in
patient positioning. Moderate bilateral foraminal stenosis.

C7-T1: Posterior disc osteophyte complex with small superimposed
central disc protrusion. Mild canal stenosis and mild right greater
than left foraminal stenosis, similar to prior.

Additional comments: Patchy T2/STIR hyperintense signal within the
pons, similar to prior MRI head.
IMPRESSION: 1. Status post C3 through C6 posterior rod screw fixation with
decompression of the canal and no substantial canal stenosis at
these levels. Expected postoperative findings without evidence of
large epidural canal hemorrhage. T2 hypointense signal within the
ventral canal is favored to reflect prominent CSF pulsation artifact
secondary to re-expansion of the canal.
2. Redemonstrated T2/STIR hyperintense signal within the cord at the
C5-C6 level with mild cord volume loss, compatible with myelomalacia
related to prior canal stenosis.
3. Similar moderate to severe foraminal stenosis at multiple levels,
as detailed above.
4. Mild to moderate canal stenosis at C6-C7.

## 2021-03-26 NOTE — Telephone Encounter (Signed)
Patient advised to make appt if swelling or redness reoccurs.

## 2021-03-30 ENCOUNTER — Other Ambulatory Visit: Payer: Self-pay | Admitting: *Deleted

## 2021-03-30 NOTE — Patient Outreach (Signed)
[facility]) Care Management  03/30/2021  [name] [name] 13-Sep-1928 [id number]   Telephone Assessment-Successful-HTN  RN spoke with pt today and received an update on his ongoing management of care.  Plan of care discussed and updated accordingly.  Based upon pt's progress will follow up quarterly. Pt without symptoms or encounters over the last month and continues to engage with his family in  managing his ongoing his ongoing care.  Will follow up in October for ongoing care management services and continue communication with his provider.   Goals Addressed             This Visit's Progress    [facility]N-Matintain My Quality of Life   On track    Follow Up Date 06/27/2021 Timeframe:  Long-Range Goal Priority:  Medium Start Date:   11/22/2020                          Expected End Date:     06/27/2021                  - complete a living will - do one enjoyable thing every day   Barriers: Health Behaviors  Why is this important?   Having a long-term illness can be scary.  It can also be stressful for you and your caregiver.  These steps may help.    Notes: 7/22- Pt continues to engage with family functions with his 85 year old granddaughter and has a good supportive system with this daughter and family. Will continue to encouraged pt to improve in his quality of life with the goals and interventions discussed accordingly. 3/16-Discussed upcoming birthday pt will be 87 years old. Discuss ongoing health measures to prevent acute issues from occurring such as monitoring his ongoing blood pressures and ability to recognize potential symptoms if present. Pt is aware with HTN there maybe no signs present so maintaining a health diet, exercises and taking his blood pressure medication with assist and lower this risk and improve his ongoing quality of life. Discuss ongoing support daughter [name].      [facility]N-Track and Manage My Blood Pressure   On track    Follow Up Date:  06/27/2021 Timeframe:  Long-term Goal Priority:  Medium Start Date:     09/18/2020                        Expected End Date:  07/09/2021                     - check blood pressure weekly - choose a place to take my blood pressure (home, clinic or office, retail store) - write blood pressure results in a log or diary   Barriers: Health Behaviors  Why is this important?   You won't feel high blood pressure, but it can still hurt your blood vessels.  High blood pressure can cause heart or kidney problems. It can also cause a stroke.  Making lifestyle changes like losing a little weight or eating less salt will help.  Checking your blood pressure at home and at different times of the day can help to control blood pressure.  If the doctor prescribes medicine remember to take it the way the doctor ordered.  Call the office if you cannot afford the medicine or if there are questions about it.     Notes: 7/22-Pt continues to report no issues and has supportive family  to assist. Blood pressures remain stable with no acute readings. Will place on quarterly follow up due to pt's ongoing progress. Will continue to review the current plan of care and reiterate on goals and interventions. 4/20-Pt continues to do well as reported by his caregiver daughter today [name]). Pt reports his blood pressures 135/65. No reported issues as pt continues to follow the plan of care. Feb-Pt reports updated readings on his blood pressure 140/80 at his provider's office this week. Pt continue to deny any reported symptoms. Again pt prefers to obtain his blood pressures readings from his providers office and does wish to have a home device for ongoing monitoring.  Jan- Pt prefers office visit monthly and reports those readings accordingly to  this RN. Most recent read 110/70 last visit pending another visit 10/24/2020. Pt declined home device for self monitoring. Will continue to encouraged adherence with education on possible  symptom management.          [name], RN Care Management Coordinator [place] Office [phone]

## 2021-05-17 ENCOUNTER — Other Ambulatory Visit: Payer: Self-pay | Admitting: *Deleted

## 2021-05-31 ENCOUNTER — Telehealth: Payer: Self-pay | Admitting: Family Medicine

## 2021-05-31 NOTE — Telephone Encounter (Signed)
Refill request for Trazodone 50 mg tablets  LOV - 06/06/20 Next OV - not scheduled Last refill - 12/28/20 #90/1

## 2021-05-31 NOTE — Telephone Encounter (Signed)
Please schedule yearly visit when possible.  Thanks.

## 2021-06-04 NOTE — Telephone Encounter (Signed)
CALLED PAT AND GOT HIM SCHEDULED FOR 10/14 @330 

## 2021-06-18 ENCOUNTER — Other Ambulatory Visit: Payer: Self-pay | Admitting: Family Medicine

## 2021-06-22 ENCOUNTER — Ambulatory Visit (INDEPENDENT_AMBULATORY_CARE_PROVIDER_SITE_OTHER): Payer: Medicare Other | Admitting: Family Medicine

## 2021-06-22 ENCOUNTER — Encounter: Payer: Self-pay | Admitting: Family Medicine

## 2021-06-22 ENCOUNTER — Other Ambulatory Visit: Payer: Self-pay

## 2021-06-22 ENCOUNTER — Other Ambulatory Visit: Payer: Self-pay | Admitting: Family Medicine

## 2021-06-22 VITALS — BP 138/80 | HR 71 | Temp 97.8°F | Ht 71.0 in | Wt 172.0 lb

## 2021-06-22 DIAGNOSIS — M109 Gout, unspecified: Secondary | ICD-10-CM | POA: Diagnosis not present

## 2021-06-22 DIAGNOSIS — E7849 Other hyperlipidemia: Secondary | ICD-10-CM

## 2021-06-22 DIAGNOSIS — R251 Tremor, unspecified: Secondary | ICD-10-CM

## 2021-06-22 DIAGNOSIS — R202 Paresthesia of skin: Secondary | ICD-10-CM

## 2021-06-22 DIAGNOSIS — I10 Essential (primary) hypertension: Secondary | ICD-10-CM

## 2021-06-22 DIAGNOSIS — Z125 Encounter for screening for malignant neoplasm of prostate: Secondary | ICD-10-CM

## 2021-06-22 DIAGNOSIS — Z8546 Personal history of malignant neoplasm of prostate: Secondary | ICD-10-CM | POA: Diagnosis not present

## 2021-06-22 DIAGNOSIS — Z0000 Encounter for general adult medical examination without abnormal findings: Secondary | ICD-10-CM | POA: Diagnosis not present

## 2021-06-22 DIAGNOSIS — Z981 Arthrodesis status: Secondary | ICD-10-CM

## 2021-06-22 DIAGNOSIS — Z8639 Personal history of other endocrine, nutritional and metabolic disease: Secondary | ICD-10-CM

## 2021-06-22 DIAGNOSIS — Z77098 Contact with and (suspected) exposure to other hazardous, chiefly nonmedicinal, chemicals: Secondary | ICD-10-CM | POA: Diagnosis not present

## 2021-06-22 DIAGNOSIS — K5900 Constipation, unspecified: Secondary | ICD-10-CM

## 2021-06-22 DIAGNOSIS — Z7189 Other specified counseling: Secondary | ICD-10-CM

## 2021-06-22 DIAGNOSIS — Z66 Do not resuscitate: Secondary | ICD-10-CM

## 2021-06-22 DIAGNOSIS — E119 Type 2 diabetes mellitus without complications: Secondary | ICD-10-CM

## 2021-06-22 DIAGNOSIS — Z7739 Contact with and (suspected) exposure to other war theater: Secondary | ICD-10-CM

## 2021-06-22 DIAGNOSIS — Z23 Encounter for immunization: Secondary | ICD-10-CM

## 2021-06-22 MED ORDER — SENNOSIDES-DOCUSATE SODIUM 8.6-50 MG PO TABS: 1.0000 | ORAL_TABLET | Freq: Every day | ORAL | Status: DC

## 2021-06-22 NOTE — Patient Instructions (Signed)
Go to the lab on the way out.   If you have mychart we'll likely use that to update you.    Take care.  Glad to see you.  Let me see your labs and then see about your refills.  I'll see about pain med options.

## 2021-06-22 NOTE — Progress Notes (Signed)
This visit occurred during the SARS-CoV-2 public health emergency.  Safety protocols were in place, including screening questions prior to the visit, additional usage of staff PPE, and extensive cleaning of exam room while observing appropriate contact time as indicated for disinfecting solutions.  I have personally reviewed the Medicare Annual Wellness questionnaire and have noted 1. The patient's medical and social history 2. Their use of alcohol, tobacco or illicit drugs 3. Their current medications and supplements 4. The patient's functional ability including ADL's, fall risks, home safety risks and hearing or visual             impairment. 5. Diet and physical activities 6. Evidence for depression or mood disorders  The patients weight, height, BMI have been recorded in the chart and visual acuity is per eye clinic.  I have made referrals, counseling and provided education to the patient based review of the above and I have provided the pt with a written personalized care plan for preventive services.  Provider list updated- see scanned forms.  Routine anticipatory guidance given to patient.  See health maintenance. The possibility exists that previously documented standard health maintenance information may have been brought forward from a previous encounter into this note.  If needed, that same information has been updated to reflect the current situation based on today's encounter.    Flu 2022 Shingles d/w pt.  PNA up to date Tetanus 2013 COVID-vaccine previously done Colon cancer screening not due given his age. Prostate cancer screening pending. Advance directive- daughter designated if patient were incapacitated.  Wouldn't want CPR.  He verifies DNR.   Cognitive function addressed- see scanned forms- and if abnormal then additional documentation follows.   In addition to [facility], follow up visit for the below conditions:  Tremor improved with primidone.  No ade on  med.  Compliant.  It helps.    Gradually progressive numbness in the hands > feet.  Using walker at baseline.  He needs an order for a new walker as his previous one is worn out.  Hypertension:   Using medication without problems or lightheadedness: yes Chest pain with exertion:no  Edema:no Short of breath:no  Still having neck pain at baseline, taking tylenol most days.  3-4/10, occ 5/10 with med.  "It slows me down."  Pain is in the base of the neck.  We talked about options.  He didn't want to take opiates but might try tramadol.  I told him I would need to check his med interactions and his labs first.  Taking senna for constipation.  D/w pt about trying senna 1-2 tabs a day, to get effect w/o over treatment.    Taste is still affected from covid, discussed.    Elevated Cholesterol: Using medications without problems: yes Muscle aches: no Diet compliance: d/w pt.  Exercise: d/w pt   H/o DM2.  No meds, labs pending.    He has a history of agent orange exposure and I told him I will write a letter on his behalf for the [place].  See following notes.  PMH and SH reviewed  Meds, vitals, and allergies reviewed.   ROS: Per HPI.  Unless specifically indicated otherwise in HPI, the patient denies:  General: fever. Eyes: acute vision changes ENT: sore throat Cardiovascular: chest pain Respiratory: SOB GI: vomiting GU: dysuria Musculoskeletal: acute back pain Derm: acute rash Neuro: acute motor dysfunction Psych: worsening mood Endocrine: polydipsia Heme: bleeding Allergy: hayfever  GEN: nad, alert and oriented HEENT: ncat NECK: supple  w/o LA CV: rrr. PULM: ctab, no inc wob ABD: soft, +bs EXT: no edema SKIN: no acute rash Walking with walker at baseline. Minimal hand tremor noted on exam

## 2021-06-25 DIAGNOSIS — Z66 Do not resuscitate: Secondary | ICD-10-CM | POA: Insufficient documentation

## 2021-06-25 LAB — COMPREHENSIVE METABOLIC PANEL WITH GFR
AG Ratio: 1.2 (calc) (ref 1.0–2.5)
ALT: 12 U/L (ref 9–46)
AST: 13 U/L (ref 10–35)
Albumin: 3.7 g/dL (ref 3.6–5.1)
Alkaline phosphatase (APISO): 69 U/L (ref 35–144)
BUN/Creatinine Ratio: 16 (calc) (ref 6–22)
BUN: 25 mg/dL (ref 7–25)
CO2: 26 mmol/L (ref 20–32)
Calcium: 9.9 mg/dL (ref 8.6–10.3)
Chloride: 103 mmol/L (ref 98–110)
Creat: 1.58 mg/dL — ABNORMAL HIGH (ref 0.70–1.22)
Globulin: 3 g/dL (ref 1.9–3.7)
Glucose, Bld: 125 mg/dL — ABNORMAL HIGH (ref 65–99)
Potassium: 4.6 mmol/L (ref 3.5–5.3)
Sodium: 139 mmol/L (ref 135–146)
Total Bilirubin: 1 mg/dL (ref 0.2–1.2)
Total Protein: 6.7 g/dL (ref 6.1–8.1)

## 2021-06-25 LAB — CBC WITH DIFFERENTIAL/PLATELET
Absolute Monocytes: 525 cells/uL (ref 200–950)
Basophils Absolute: 37 cells/uL (ref 0–200)
Basophils Relative: 0.5 %
Eosinophils Absolute: 170 cells/uL (ref 15–500)
Eosinophils Relative: 2.3 %
HCT: 38.6 % (ref 38.5–50.0)
Hemoglobin: 13.2 g/dL (ref 13.2–17.1)
Lymphs Abs: 2227 cells/uL (ref 850–3900)
MCH: 31.7 pg (ref 27.0–33.0)
MCHC: 34.2 g/dL (ref 32.0–36.0)
MCV: 92.8 fL (ref 80.0–100.0)
MPV: 9.6 fL (ref 7.5–12.5)
Monocytes Relative: 7.1 %
Neutro Abs: 4440 cells/uL (ref 1500–7800)
Neutrophils Relative %: 60 %
Platelets: 253 10*3/uL (ref 140–400)
RBC: 4.16 10*6/uL — ABNORMAL LOW (ref 4.20–5.80)
RDW: 13.1 % (ref 11.0–15.0)
Total Lymphocyte: 30.1 %
WBC: 7.4 10*3/uL (ref 3.8–10.8)

## 2021-06-25 LAB — LIPID PANEL
Cholesterol: 154 mg/dL
HDL: 46 mg/dL
LDL Cholesterol (Calc): 85 mg/dL
Non-HDL Cholesterol (Calc): 108 mg/dL
Total CHOL/HDL Ratio: 3.3 (calc)
Triglycerides: 134 mg/dL

## 2021-06-25 LAB — REFLEX PSA, FREE
PSA, % Free: 18 % (calc) — ABNORMAL LOW (ref >25)
PSA, Free: 0.9 ng/mL

## 2021-06-25 LAB — PSA, TOTAL WITH REFLEX TO PSA, FREE (REFL): PSA, Total: 5.1 ng/mL — ABNORMAL HIGH

## 2021-06-25 LAB — HEMOGLOBIN A1C
Hgb A1c MFr Bld: 5.9 % of total Hgb — ABNORMAL HIGH (ref <5.7)
Mean Plasma Glucose: 123 mg/dL
eAG (mmol/L): 6.8 mmol/L

## 2021-06-25 LAB — URIC ACID: Uric Acid, Serum: 5 mg/dL (ref 4.0–8.0)

## 2021-06-25 LAB — TSH: TSH: 2.75 mIU/L (ref 0.40–4.50)

## 2021-06-25 NOTE — Assessment & Plan Note (Signed)
Taking senna for constipation.  D/w pt about trying senna 1-2 tabs a day, to get effect w/o over treatment.

## 2021-06-25 NOTE — Assessment & Plan Note (Signed)
Continue lisinopril.  See notes on labs. 

## 2021-06-25 NOTE — Assessment & Plan Note (Signed)
History of.  See note on PSA.

## 2021-06-25 NOTE — Assessment & Plan Note (Signed)
Minimal tremor noted on exam.  Continue primidone.

## 2021-06-25 NOTE — Assessment & Plan Note (Signed)
Gradually progressive numbness in the hands > feet.  Using walker at baseline.  He needs an order for a new walker as his previous one is worn out..  I told him I will work on that. See notes on labs.

## 2021-06-25 NOTE — Assessment & Plan Note (Signed)
Still having neck pain at baseline, taking tylenol most days.  3-4/10, occ 5/10 with med.  "It slows me down."  Pain is in the base of the neck.  We talked about options.  He didn't want to take opiates but might try tramadol.  I told him I would need to check his med interactions and his labs first.

## 2021-06-25 NOTE — Assessment & Plan Note (Signed)
Flu 2022 Shingles d/w pt.  PNA up to date Tetanus 2013 COVID-vaccine previously done Colon cancer screening not due given his age. Prostate cancer screening pending. Advance directive- daughter designated if patient were incapacitated.  Wouldn't not want CPR.  He verifies DNR.   Cognitive function addressed- see scanned forms- and if abnormal then additional documentation follows.

## 2021-06-25 NOTE — Assessment & Plan Note (Signed)
H/o DM2.  No meds, labs pending.

## 2021-06-25 NOTE — Assessment & Plan Note (Signed)
Continue Lipitor.  He did not think that his current aches were related to statin use.  See notes on labs.

## 2021-06-25 NOTE — Assessment & Plan Note (Signed)
He has a history of agent orange exposure and I told him I will write a letter on his behalf for the [place].  See following notes.

## 2021-06-25 NOTE — Assessment & Plan Note (Signed)
Advance directive- daughter designated if patient were incapacitated.  Wouldn't want CPR.  He verifies DNR.

## 2021-06-25 NOTE — Assessment & Plan Note (Addendum)
Advance directive- daughter designated if patient were incapacitated.  Wouldn't want CPR.  He verifies DNR.

## 2021-06-27 ENCOUNTER — Other Ambulatory Visit: Payer: Self-pay | Admitting: *Deleted

## 2021-06-27 NOTE — Patient Outreach (Signed)
[facility]) Care Management  06/27/2021  COSBY PROBY [birth year] [id number]   Telephone Assessment-Successful-HTN  RN spoke with pt today and reviewed and discussed the current plan of care along with pt's progress documented within the plan of care. Pt reports he is doing well with no acute issues at this time. Reports recent follow up with his provider on last Friday with most recent blood pressure 138/80 with no precipitating symptoms of hypo-hypertensin over the last quarterly. Based upon pt's ongoing progress in managing his HTN will refer to a health coach for ongoing disease management services.  Will refer to health coach for next follow up in Jan.  Goals Addressed             This Visit's Progress    THN-Matintain My Quality of Life   On track    Follow Up Date 09/18/2021 QUARTERLY FOLLOW UP Timeframe:  Long-Range Goal Priority:  Medium Start Date:   11/22/2020                          Expected End Date:     10/08/2021                  - complete a living will - do one enjoyable thing every day   Barriers: Health Behaviors  Why is this important?   Having a long-term illness can be scary.  It can also be stressful for you and your caregiver.  These steps may help.    Notes: 10/19-Pt continues to do well with no acute needs at this time. Pt remains in the care of his daughter [name]) and continue to improve on his quality of life with family involvement. 7/22- Pt continues to engage with family functions with his 85 year old granddaughter and has a good supportive system with this daughter and family. Will continue to encouraged pt to improve in his quality of life with the goals and interventions discussed accordingly. 3/16-Discussed upcoming birthday pt will be 87 years old. Discuss ongoing health measures to prevent acute issues from occurring such as monitoring his ongoing blood pressures and ability to recognize potential symptoms if present. Pt  is aware with HTN there maybe no signs present so maintaining a health diet, exercises and taking his blood pressure medication with assist and lower this risk and improve his ongoing quality of life. Discuss ongoing support daughter [name].      THN-Track and Manage My Blood Pressure   On track    Follow Up Date: 09/18/2021 QUARTERLY FOLLOW UPS Timeframe:  Long-term Goal Priority:  Medium Start Date:     09/18/2020                        Expected End Date:  10/08/2021                   - check blood pressure weekly - choose a place to take my blood pressure (home, clinic or office, retail store) - write blood pressure results in a log or diary   Barriers: Health Behaviors  Why is this important?   You won't feel high blood pressure, but it can still hurt your blood vessels.  High blood pressure can cause heart or kidney problems. It can also cause a stroke.  Making lifestyle changes like losing a little weight or eating less salt will help.  Checking your blood pressure at home and at  different times of the day can help to control blood pressure.  If the doctor prescribes medicine remember to take it the way the doctor ordered.  Call the office if you cannot afford the medicine or if there are questions about it.     Notes: 10/19-Pt verified his most recent BP on his provider's visit 10/14 at 138/80 with no related or residual symptoms of hypo-hypertension encountered. Pt remains aware of what to do if acute symptoms occur. 7/22-Pt continues to report no issues and has supportive family to assist. Blood pressures remain stable with no acute readings. Will place on quarterly follow up due to pt's ongoing progress. Will continue to review the current plan of care and reiterate on goals and interventions. 4/20-Pt continues to do well as reported by his caregiver daughter today [name]). Pt reports his blood pressures 135/65. No reported issues as pt continues to follow the plan of care. Feb-Pt  reports updated readings on his blood pressure 140/80 at his provider's office this week. Pt continue to deny any reported symptoms. Again pt prefers to obtain his blood pressures readings from his providers office and does wish to have a home device for ongoing monitoring.  Jan- Pt prefers office visit monthly and reports those readings accordingly to  this RN. Most recent read 110/70 last visit pending another visit 10/24/2020. Pt declined home device for self monitoring. Will continue to encouraged adherence with education on possible symptom management.          [name], RN Care Management Coordinator [place] Office [phone]

## 2021-06-28 ENCOUNTER — Other Ambulatory Visit: Payer: Self-pay | Admitting: *Deleted

## 2021-07-02 ENCOUNTER — Telehealth: Payer: Self-pay | Admitting: Family Medicine

## 2021-07-02 DIAGNOSIS — R269 Unspecified abnormalities of gait and mobility: Secondary | ICD-10-CM

## 2021-07-02 MED ORDER — CARVEDILOL PHOSPHATE ER 20 MG PO CP24: 20.0000 mg | ORAL_CAPSULE | Freq: Every day | ORAL | 3 refills | Status: DC

## 2021-07-02 MED ORDER — TRAMADOL HCL 50 MG PO TABS: 50.0000 mg | ORAL_TABLET | Freq: Two times a day (BID) | ORAL | 1 refills | Status: DC | PRN

## 2021-07-02 NOTE — Telephone Encounter (Signed)
He needs an order for new walker, dx R20.2, R26.9.  Please let me know if I should put that in letter for him to take to DME or if he needs to go in as an order.  Multiple issues to address.  Please notify patient about his labs (see notes on labs) and please send him a hard copy of the lab report.  Please let him know I am working on everything we discussed at the office visit but I was out of office in the meantime due to an unexpected illness.  I am working on a letter for him regarding agent orange for the [place]. I will send that to him when I complete it.    It looks like he is due for follow-up with cardiology.  Please have him call [facility] at [phone].    I worked on his routine refills in the meantime.  It looks like he needed a refill on carvedilol so I sent that to mail-order.  He should be good on his other refills until at least the spring time.  We talked about pain med options.  He didn't want to take high-strength opiates but might try tramadol.  It would be reasonable to try taking tramadol up to twice a day.  Sedation caution.  He may only need it once a day.  He can also break the pills in half to see how much effect he had with that.  I sent that prescription locally.  Please let me know how that goes.  Thanks.

## 2021-07-05 NOTE — Addendum Note (Signed)
Addended by: Sherrilee Gilles B on: 07/05/2021 11:45 AM   Modules accepted: Orders

## 2021-07-05 NOTE — Telephone Encounter (Signed)
Discussed lab results and this note with patient. Results have been printed and mailed to patient also. Advised patient to call and schedule f/u appt with cardiology and patient agrees to do so. He did already pick up the tramadol rx and states it has been doing really well for him. I went ahead and placed the DME order for the walker and notified [employer] of order.

## 2021-07-06 NOTE — Telephone Encounter (Signed)
Noted. Thanks.

## 2021-07-27 ENCOUNTER — Other Ambulatory Visit: Payer: Self-pay | Admitting: *Deleted

## 2021-07-30 NOTE — Patient Instructions (Signed)
Visit Information  Thank you for taking time to visit with me today. Please don't hesitate to contact me if I can be of assistance to you before our next scheduled telephone appointment.  Following are the goals we discussed today:  Current Barriers:  Knowledge Deficits related to plan of care for management of HTN   RNCM Clinical Goal(s):  Patient will verbalize understanding of plan for management of HTN as evidenced by continuation of monitoring blood sugars ans adhering to diabetic diet through collaboration with RN Care manager, provider, and care team.   Interventions: Inter-disciplinary care team collaboration (see longitudinal plan of care) Evaluation of current treatment plan related to  self management and patient's adherence to plan as established by provider    Patient Goals/Self-Care Activities: Take medications as prescribed   Attend all scheduled provider appointments Call pharmacy for medication refills 3-7 days in advance of running out of medications Attend church or other social activities Perform all self care activities independently  Call provider office for new concerns or questions  check blood pressure weekly choose a place to take my blood pressure (home, clinic or office, retail store) learn about high blood pressure call doctor for signs and symptoms of high blood pressure keep all doctor appointments take medications for blood pressure exactly as prescribed report new symptoms to your doctor limit salt intake to 2300 mg/day    Our next appointment is by telephone on November 02, 2021  Please call the care guide team at [phone] if you need to cancel or reschedule your appointment.   Please call 911 if you are experiencing a Mental Health or [place] or need someone to talk to.  The patient verbalized understanding of instructions, educational materials, and care plan provided today and agreed to receive a mailed copy of patient  instructions, educational materials, and care plan.   Telephone follow up appointment with care management team member scheduled for: The patient has been provided with contact information for the care management team and has been advised to call with any health related questions or concerns.   [facility] [phone]

## 2021-07-31 NOTE — Patient Outreach (Signed)
[facility]) Care Management [place] Note   07/31/2021 Name:  [name] [name] MRN:  [id number] DOB:  09-01-1929  Summary:  Patient does not monitor his blood pressure at home. Per patient he does not have a monitor. His appetite is good. He does not add extra sodium to diet.  He has a walker with no recent falls. Patient does not drive. He lives with daughter and son in [place].  He has pain in the base of his neck. He uses melatonin to help sleep.  He has not scheduled a recent eye exam. He has received all boosters and flu vaccines.   Recommendations/Changes made from today's visit: Patient will schedule an eye exam He will do home monitoring of blood pressure RN provided a blood pressure monitor    Subjective: ABDOULAYE [name] is an 87 y.o. year old male who is a primary patient of Tonia Ghent, MD. The care management team was consulted for assistance with care management and/or care coordination needs.    RN Health Coach completed Telephone Visit today.   Objective:  Medications Reviewed Today     Reviewed by Verlin Grills, RN (Case Manager) on 07/27/21 at 1014  Med List Status: <None>   Medication Order Taking? Sig Documenting Provider Last Dose Status Informant  acetaminophen (TYLENOL) 500 MG tablet [id number] Yes Take 2 tablets (1,000 mg total) by mouth 3 (three) times daily as needed for mild pain or moderate pain. Tonia Ghent, MD Taking Active   allopurinol (ZYLOPRIM) 300 MG tablet [id number] Yes TAKE 1 TABLET DAILY Tonia Ghent, MD Taking Active   aspirin 81 MG tablet [id number] Yes Take 81 mg by mouth daily. [provider] Taking Active Self  atorvastatin (LIPITOR) 40 MG tablet [id number] Yes TAKE 1 TABLET DAILY Tonia Ghent, MD Taking Active   carvedilol (COREG CR) 20 MG 24 hr capsule [id number] Yes Take 1 capsule (20 mg total) by mouth daily. Tonia Ghent, MD Taking Active   lisinopril (ZESTRIL) 20 MG tablet  [id number] Yes TAKE 1 TABLET DAILY Tonia Ghent, MD Taking Active   melatonin 3 MG TABS tablet [id number] Yes Take 1 tablet (3 mg total) by mouth at bedtime. Bary Leriche, PA-C Taking Active   Nutritional Supplements (CARNATION BREAKFAST ESSENTIALS PO) [id number] Yes Take 1 Package by mouth daily.  [provider] Taking Active Self  omeprazole (PRILOSEC) 40 MG capsule [id number] Yes TAKE 1 CAPSULE DAILY AS NEEDED Tonia Ghent, MD Taking Active   primidone (MYSOLINE) 50 MG tablet [id number] Yes TAKE ONE-HALF (1/2) TABLET DAILY Tonia Ghent, MD Taking Active   senna-docusate (SENOKOT-S) 8.6-50 MG tablet [id number] Yes Take 1-2 tablets by mouth daily. Tonia Ghent, MD Taking Active   tamsulosin [facility]) 0.4 MG CAPS capsule [id number] Yes TAKE 1 CAPSULE DAILY Tonia Ghent, MD Taking Active   traMADol Veatrice Bourbon) 50 MG tablet [id number] Yes Take 1 tablet (50 mg total) by mouth every 12 (twelve) hours as needed. Tonia Ghent, MD Taking Active   traZODone (DESYREL) 50 MG tablet [id number] Yes TAKE 1 TABLET AT BEDTIME Tonia Ghent, MD Taking Active   vitamin B-12 (CYANOCOBALAMIN) 1000 MCG tablet [id number] Yes Take 1 tablet (1,000 mcg total) by mouth daily. Tonia Ghent, MD Taking Active Self             SDOH:  (Social Determinants of Health) assessments and interventions performed:  SDOH Interventions    Flowsheet Row Most Recent  Value  SDOH Interventions   Food Insecurity Interventions Intervention Not Indicated  Housing Interventions Intervention Not Indicated  Transportation Interventions Intervention Not Indicated       Care Plan  Review of patient past medical history, allergies, medications, health status, including review of consultants reports, laboratory and other test data, was performed as part of comprehensive evaluation for care management services.   Care Plan : RN Care Manager Plan of Care  Updates made by Leonora Gores, Eppie Gibson, RN since  07/31/2021 12:00 AM     Problem: Knowledge Deficit Related to Hypertension amd Care Coordination Needs   Priority: High     Long-Range Goal: Development Plan of Care for Management of Hypertension   Start Date: 07/27/2021  Expected End Date: 09/07/2022  Priority: High  Note:   Current Barriers:  Knowledge Deficits related to plan of care for management of HTN  RNCM Clinical Goal(s):  Patient will verbalize understanding of plan for management of HTN through collaboration with RN Care manager, provider, and care team.   Interventions: Inter-disciplinary care team collaboration (see longitudinal plan of care) Evaluation of current treatment plan related to  self management and patient's adherence to plan as established by provider  Hypertension Interventions: Last practice recorded BP readings:  BP Readings from Last 3 Encounters:  06/22/21 138/80  01/01/21 (!) 165/79  07/10/20 (!) 147/83  Most recent eGFR/CrCl: No results found for: EGFR  No components found for: CRCL  Evaluation of current treatment plan related to hypertension self management and patient's adherence to plan as established by provider; Provided education to patient re: stroke prevention, s/s of heart attack and stroke; Reviewed medications with patient and discussed importance of compliance; Provided assistance with obtaining home blood pressure monitor via mailing the patient a BP monitor;  Discussed plans with patient for ongoing care management follow up and provided patient with direct contact information for care management team; Advised patient, providing education and rationale, to monitor blood pressure daily and record, calling PCP for findings outside established parameters;  Provided education on prescribed diet ;ow sodium diet;   Patient Goals/Self-Care Activities: Patient will self administer medications as prescribed Patient will attend all scheduled provider appointments Patient will call  pharmacy for medication refills Patient will attend church or other social activities Patient will continue to perform ADL's independently Patient will continue to perform IADL's independently Patient will call provider office for new concerns or questions  Follow Up Plan:  Telephone follow up appointment with care management team member scheduled for:  February The patient has been provided with contact information for the care management team and has been advised to call with any health related questions or concerns.  Patient will monitor blood pressure at home  RN provided education on the signs and symptoms of Stroke RN provided education on  the signs and symptoms of Heart Attack RN provided educational on hypertension      Plan: Telephone follow up appointment with care management team member scheduled for:  February 2023 The patient has been provided with contact information for the care management team and has been advised to call with any health related questions or concerns.  RN provided a blood pressure monitor Patient will monitor blood pressure and record RN provided education material A matter of choices blood pressure control   [place] Management [phone]

## 2021-10-10 ENCOUNTER — Ambulatory Visit (INDEPENDENT_AMBULATORY_CARE_PROVIDER_SITE_OTHER): Payer: Medicare Other | Admitting: Nurse Practitioner

## 2021-10-10 ENCOUNTER — Other Ambulatory Visit: Payer: Self-pay

## 2021-10-10 ENCOUNTER — Telehealth: Payer: Self-pay | Admitting: Family Medicine

## 2021-10-10 ENCOUNTER — Encounter: Payer: Self-pay | Admitting: Nurse Practitioner

## 2021-10-10 VITALS — BP 168/70 | HR 71 | Temp 98.8°F | Resp 14 | Wt 177.2 lb

## 2021-10-10 DIAGNOSIS — R350 Frequency of micturition: Secondary | ICD-10-CM | POA: Diagnosis not present

## 2021-10-10 DIAGNOSIS — R339 Retention of urine, unspecified: Secondary | ICD-10-CM | POA: Insufficient documentation

## 2021-10-10 DIAGNOSIS — N3090 Cystitis, unspecified without hematuria: Secondary | ICD-10-CM | POA: Diagnosis not present

## 2021-10-10 DIAGNOSIS — R300 Dysuria: Secondary | ICD-10-CM | POA: Diagnosis not present

## 2021-10-10 DIAGNOSIS — Z8546 Personal history of malignant neoplasm of prostate: Secondary | ICD-10-CM | POA: Insufficient documentation

## 2021-10-10 LAB — POCT URINALYSIS DIPSTICK
Bilirubin, UA: NEGATIVE
Glucose, UA: NEGATIVE
Ketones, UA: NEGATIVE
Nitrite, UA: NEGATIVE
Protein, UA: POSITIVE — AB
Spec Grav, UA: 1.02 (ref 1.010–1.025)
Urobilinogen, UA: 0.2 E.U./dL
pH, UA: 6 (ref 5.0–8.0)

## 2021-10-10 MED ORDER — CEPHALEXIN 500 MG PO CAPS: 500.0000 mg | ORAL_CAPSULE | Freq: Four times a day (QID) | ORAL | 0 refills | Status: DC

## 2021-10-10 NOTE — Telephone Encounter (Signed)
Incoming call from pt's daughter, Davene Costain, stating concerns that since yesterday the pt has not been able to fully empty his bladder, has mild lower back pain, urine is slightly darker than normal, w/o increased frequency or pressure.  The pt's temp was taken while on the phone by a forehead scanner & each reading was 99.8 & 100.1; furthermore [name] shared that the pt noticed weakness that also began yesterday.  Transferred the call to [facility] w/Access Nurse for further direct care.  Thank you

## 2021-10-10 NOTE — Telephone Encounter (Signed)
Please see note below access note.  [place] [facility] - Client TELEPHONE ADVICE RECORD Access[facility] Patient Name: [name] [name] Gender: Male DOB: 1928/10/03 Age: 88 Y 7 M 14 D Return Phone Number: [phone] (Primary), [phone] (Secondary) Address: City/ [place]/ [place] [address] Client [facility] - Client Client Site [place] - Day Provider Renford Dills - MD Contact Type Call Who Is Calling Patient / Member / Family / Caregiver Call Type Triage / Clinical Caller Name Davene Costain Relationship To Patient Daughter Return Phone Number [phone] (Primary) Chief Complaint WEAKNESS - sudden on one side of face or body Reason for Call Symptomatic / Request for [place] says her dad is not able to fully empty his bladder and having lower back pain. Urine is darker than normal and he is weaker than normal. He has a fever 99.8 and the second 100.1 Translation No Nurse Assessment Nurse: Doren Custard, RN, Caryl Pina Date/Time (Eastern Time): 10/10/2021 11:31:32 AM Confirm and document reason for call. If symptomatic, describe symptoms. ---Caller states her dad is unable to fully empty his bladder, his urine is dark, and he is having lower back pain. Symptoms started yesterday. He is feeling weak. His temp is 99.8. Does the patient have any new or worsening symptoms? ---Yes Will a triage be completed? ---Yes Related visit to physician within the last 2 weeks? ---No Does the PT have any chronic conditions? (i.e. diabetes, asthma, this includes High risk factors for pregnancy, etc.) ---Yes List chronic conditions. ---heart condition, nephritis, prostate cancer (remission) Is this a behavioral health or substance abuse call? ---No Guidelines Guideline Title Affirmed Question Affirmed Notes Nurse Date/Time Eilene Ghazi Time) Urination Pain - Male Side (flank) or  lower back pain present Doren Custard, RNCaryl Pina 10/10/2021 11:33:57 AM Disp. Time Eilene Ghazi Time) Disposition Final User 10/10/2021 11:27:06 AM Send to Urgent Queue [place], [place] PLEASE NOTE: All timestamps contained within this report are represented as [place] Standard Time. CONFIDENTIALTY NOTICE: This fax transmission is intended only for the addressee. It contains information that is legally privileged, confidential or otherwise protected from use or disclosure. If you are not the intended recipient, you are strictly prohibited from reviewing, disclosing, copying using or disseminating any of this information or taking any action in reliance on or regarding this information. If you have received this fax in error, please notify us immediately by telephone so that we can arrange for its return to [place]. Phone: [phone], Toll-Free: [phone], Fax: [phone] Page: 2 of 2 Call Id: [id number] 10/10/2021 11:38:50 AM See HCP within 4 Hours (or PCP triage) Yes Doren Custard, RN, Hulan Saas Disagree/Comply Comply Caller Understands Yes PreDisposition Call Doctor Care Advice Given Per Guideline SEE HCP (OR PCP TRIAGE) WITHIN 4 HOURS: * IF OFFICE WILL BE OPEN: You need to be seen within the next 3 or 4 hours. Call your doctor (or NP/PA) now or as soon as the office opens. CARE ADVICE given per Urination Pain - Male (Adult) guideline. Referrals REFERRED TO PCP OFFIC

## 2021-10-10 NOTE — Patient Instructions (Signed)
Nice to see you today Sent medications into pharmacy If your symptoms fail to improve or get worse let us know

## 2021-10-10 NOTE — Assessment & Plan Note (Signed)
Urinalysis indicative of infection

## 2021-10-10 NOTE — Telephone Encounter (Signed)
Will evaluated in office

## 2021-10-10 NOTE — Assessment & Plan Note (Signed)
History of prostate cancer in approximate 2006.  No longer followed by urology per patient report.  Last PSA slightly elevated was drawn October 2022.  Prostate nontender on exam today low likelihood of prostatitis

## 2021-10-10 NOTE — Progress Notes (Signed)
Acute Office Visit  Subjective:    Patient ID: [name] [name], male    DOB: 1929/01/11, 88 y.o.   MRN: [id number]  Chief Complaint  Patient presents with   Urinary Frequency    And urgency, lower back pain, some burning with urination. Started on 10/08/21.      Patient is in today for Urinary complaints  Symptoms started 2 days ago States that he can pee but feels like he does not empty all the way. Frequency, incomplete emptying, low back pain. Patient has a history of UTI in the past and a remote history of prostate cancer  Last Bm today, was a little loose per report. Denies constipation    Past Medical History:  Diagnosis Date   Acute renal failure ([place]) 12/06/2013   Back pain    occasionally   Benign essential tremor 2005   takes Primidone daily   Choledocholithiasis 12/06/2013   Constipation    takes OTC stool softener   Coronary artery disease 2007   s/p CABG, DR Johnsie Cancel   GERD (gastroesophageal reflux disease)    takes Nexium daily   Gout 1995   "~ once/yr" (03/17/2014)   History of colon polyps    Hyperlipidemia 12/2005   takes Atorvastatin daily   Hypertension 1995   takes Lisinopril and Coreg daily   possible HCAP (healthcare-associated pneumonia) 12/09/2013   Prostate cancer ([place]) 2007   S/P treatment, followed by Urology prev   Right bundle branch block (RBBB)    Stenosis of cervical spine    Type II diabetes mellitus ([place])    no meds;diet and exercise controlled    Wears dentures    Wears glasses     Past Surgical History:  Procedure Laterality Date   Adenosine myoview  01/24/2006   Ischemia by EKG, Cath   CATARACT EXTRACTION W/ INTRAOCULAR LENS IMPLANT Left 10/2013   CHOLECYSTECTOMY N/A 12/06/2013   Procedure: Diagnostic Laparoscopy for  drainage Intrabdominal abcess;  Surgeon: Rolm Bookbinder, MD;  Location: [facility];  Service: General;  Laterality: N/A;   CHOLECYSTECTOMY N/A 03/17/2014   Procedure: LAPAROSCOPIC CHOLECYSTECTOMY ;  Surgeon: Rolm Bookbinder, MD;  Location: [facility];  Service: General;  Laterality: N/A;   COLONOSCOPY     CORONARY ARTERY BYPASS GRAFT  2007   CABGX 4   CYSTOSCOPY  02/15/2004   Mod BPH, moder tribec ?   ERCP N/A 12/06/2013   Procedure: ENDOSCOPIC RETROGRADE CHOLANGIOPANCREATOGRAPHY (ERCP);  Surgeon: Milus Banister, MD;  Location: [facility];  Service: Endoscopy;  Laterality: N/A;   LAPAROSCOPIC CHOLECYSTECTOMY  03/17/2014   LUMBAR FUSION  02/2012   lumbar spine for spinal stenosis   POSTERIOR CERVICAL FUSION/FORAMINOTOMY N/A 05/01/2020   Procedure: Posterior cervical fusion with lateral mass fixation - Cervical three - Cervical six, laminectomy Cervical three-six;  Surgeon: Eustace Moore, MD;  Location: [facility];  Service: Neurosurgery;  Laterality: N/A;   PROSTATE CRYOABLATION  09/18/2005   prostate CA    Family History  Problem Relation Age of Onset   Heart disease Mother    Cancer Mother    Cancer Sister    Diabetes Sister    Cancer Brother        prostate   Prostate cancer Brother    Hypothyroidism Brother    Depression Neg Hx    Alcohol abuse Neg Hx    Drug abuse Neg Hx    Stroke Neg Hx    Colon cancer Neg Hx     Social History  Socioeconomic History   Marital status: Married    Spouse name: Not on file   Number of children: 1   Years of education: Not on file   Highest education level: Not on file  Occupational History   Occupation: Retired Pharmacist, community  Tobacco Use   Smoking status: Never   Smokeless tobacco: Never  Vaping Use   Vaping Use: Never used  Substance and Sexual Activity   Alcohol use: No   Drug use: No   Sexual activity: Never  Other Topics Concern   Not on file  Social History Narrative   Widowed 2020 after 34+ years of marriage, lives with daughter.     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 [employer]   Agent orange exposure.  Sig service related noise exposure.     Right handed   Social Determinants of Health   Financial Resource Strain: Not on file  Food  Insecurity: No Food Insecurity   Worried About Charity fundraiser in the Last Year: Never true   Ran Out of Food in the Last Year: Never true  Transportation Needs: Unknown   Lack of Transportation (Medical): No   Lack of Transportation (Non-Medical): Not on file  Physical Activity: Not on file  Stress: Not on file  Social Connections: Not on file  Intimate Partner Violence: Not on file    Outpatient Medications Prior to Visit  Medication Sig Dispense Refill   acetaminophen (TYLENOL) 500 MG tablet Take 2 tablets (1,000 mg total) by mouth 3 (three) times daily as needed for mild pain or moderate pain.     allopurinol (ZYLOPRIM) 300 MG tablet TAKE 1 TABLET DAILY 90 tablet 3   aspirin 81 MG tablet Take 81 mg by mouth daily.     atorvastatin (LIPITOR) 40 MG tablet TAKE 1 TABLET DAILY 90 tablet 3   carvedilol (COREG CR) 20 MG 24 hr capsule Take 1 capsule (20 mg total) by mouth daily. 90 capsule 3   lisinopril (ZESTRIL) 20 MG tablet TAKE 1 TABLET DAILY 90 tablet 3   melatonin 3 MG TABS tablet Take 1 tablet (3 mg total) by mouth at bedtime. 30 tablet 0   Nutritional Supplements (CARNATION BREAKFAST ESSENTIALS PO) Take 1 Package by mouth daily.      omeprazole (PRILOSEC) 40 MG capsule TAKE 1 CAPSULE DAILY AS NEEDED 90 capsule 0   primidone (MYSOLINE) 50 MG tablet TAKE ONE-HALF (1/2) TABLET DAILY 45 tablet 3   senna-docusate (SENOKOT-S) 8.6-50 MG tablet Take 1-2 tablets by mouth daily.     tamsulosin (FLOMAX) 0.4 MG CAPS capsule TAKE 1 CAPSULE DAILY 90 capsule 3   traMADol (ULTRAM) 50 MG tablet Take 1 tablet (50 mg total) by mouth every 12 (twelve) hours as needed. 60 tablet 1   traZODone (DESYREL) 50 MG tablet TAKE 1 TABLET AT BEDTIME 90 tablet 1   vitamin B-12 (CYANOCOBALAMIN) 1000 MCG tablet Take 1 tablet (1,000 mcg total) by mouth daily.     No facility-administered medications prior to visit.    Allergies  Allergen Reactions   Sulfonamide Derivatives     REACTION: lethargic     Review of Systems  Constitutional:  Positive for fatigue and fever (99.8). Negative for chills.  Gastrointestinal:  Positive for diarrhea. Negative for abdominal pain, nausea and vomiting.  Genitourinary:  Positive for difficulty urinating and frequency. Negative for dysuria, penile discharge, penile pain and penile swelling.       Negative Nocturia  Musculoskeletal:  Positive for back  pain.      Objective:    Physical Exam Vitals and nursing note reviewed. Exam conducted with a chaperone present [facility], RMA).  Constitutional:      Appearance: Normal appearance.  Cardiovascular:     Rate and Rhythm: Normal rate and regular rhythm.  Pulmonary:     Effort: Pulmonary effort is normal.     Breath sounds: Normal breath sounds.  Abdominal:     General: Bowel sounds are normal. There is no distension.     Palpations: Abdomen is soft. There is no mass.     Tenderness: There is abdominal tenderness in the suprapubic area. There is no right CVA tenderness or left CVA tenderness.  Genitourinary:    Prostate: Not enlarged and not tender.     Rectum: Normal.  Musculoskeletal:     Lumbar back: No tenderness or bony tenderness.     Right lower leg: 1+ Pitting Edema present.     Left lower leg: 1+ Pitting Edema present.     Comments: Felt it some in back with bilateral straight leg raises   Skin:    General: Skin is warm.  Neurological:     Mental Status: He is alert.     Deep Tendon Reflexes:     Reflex Scores:      Patellar reflexes are 2+ on the right side and 2+ on the left side.    Comments: Bilateral lower extremity strength 5/5     BP (!) 168/70    Pulse 71    Temp 98.8 F (37.1 C)    Resp 14    Wt 177 lb 4 oz (80.4 kg)    SpO2 98%    BMI 24.72 kg/m  Wt Readings from Last 3 Encounters:  10/10/21 177 lb 4 oz (80.4 kg)  06/22/21 172 lb (78 kg)  07/10/20 175 lb (79.4 kg)    Health Maintenance Due  Topic Date Due   Zoster Vaccines- Shingrix (1 of 2) Never  done   FOOT EXAM  12/26/2018   COVID-19 Vaccine (4 - Booster for Moderna series) 09/12/2020   OPHTHALMOLOGY EXAM  11/22/2020    There are no preventive care reminders to display for this patient.   Lab Results  Component Value Date   TSH 2.75 06/22/2021   Lab Results  Component Value Date   WBC 7.4 06/22/2021   HGB 13.2 06/22/2021   HCT 38.6 06/22/2021   MCV 92.8 06/22/2021   PLT 253 06/22/2021   Lab Results  Component Value Date   NA 139 06/22/2021   K 4.6 06/22/2021   CO2 26 06/22/2021   GLUCOSE 125 (H) 06/22/2021   BUN 25 06/22/2021   CREATININE 1.58 (H) 06/22/2021   BILITOT 1.0 06/22/2021   ALKPHOS 50 05/08/2020   AST 13 06/22/2021   ALT 12 06/22/2021   PROT 6.7 06/22/2021   ALBUMIN 2.7 (L) 05/08/2020   CALCIUM 9.9 06/22/2021   ANIONGAP 9 05/09/2020   GFR 52.87 (L) 06/06/2020   Lab Results  Component Value Date   CHOL 154 06/22/2021   Lab Results  Component Value Date   HDL 46 06/22/2021   Lab Results  Component Value Date   LDLCALC 85 06/22/2021   Lab Results  Component Value Date   TRIG 134 06/22/2021   Lab Results  Component Value Date   CHOLHDL 3.3 06/22/2021   Lab Results  Component Value Date   HGBA1C 5.9 (H) 06/22/2021       Assessment &  Plan:   Problem List Items Addressed This Visit       Genitourinary   Cystitis    Urinalysis  urinary tract infection.  We will treat patient proactively.  Send off urine for culture, pending result.  Start Keflex 500 mg 4 times daily for 7 days.  Did calculate patient's GFR at 35.  Signs and symptoms reviewed when to return to clinic and when to seek urgent or emergent health care.      Relevant Medications   cephALEXin (KEFLEX) 500 MG capsule   Other Relevant Orders   Urine Culture     Other   Urinary frequency - Primary    Urinalysis indicative of infection      Relevant Orders   POCT urinalysis dipstick (Completed)   History of prostate cancer    History of prostate cancer in  approximate 2006.  No longer followed by urology per patient report.  Last PSA slightly elevated was drawn October 2022.  Prostate nontender on exam today low likelihood of prostatitis      Incomplete bladder emptying   Relevant Orders   Urine Culture     Meds ordered this encounter  Medications   cephALEXin (KEFLEX) 500 MG capsule    Sig: Take 1 capsule (500 mg total) by mouth 4 (four) times daily for 7 days.    Dispense:  28 capsule    Refill:  0    Order Specific Question:   Supervising Provider    Answer:   Loura Pardon A [[id number]]   This visit occurred during the SARS-CoV-2 public health emergency.  Safety protocols were in place, including screening questions prior to the visit, additional usage of staff PPE, and extensive cleaning of exam room while observing appropriate contact time as indicated for disinfecting solutions.   Romilda Garret, NP

## 2021-10-10 NOTE — Assessment & Plan Note (Signed)
Urinalysis  urinary tract infection.  We will treat patient proactively.  Send off urine for culture, pending result.  Start Keflex 500 mg 4 times daily for 7 days.  Did calculate patient's GFR at 35.  Signs and symptoms reviewed when to return to clinic and when to seek urgent or emergent health care.

## 2021-10-10 NOTE — Telephone Encounter (Signed)
Caryl Pina RN with access nurse request appt for pt within 4 hrs. I spoke with [name] (DPR signed) starting 2 days ago pt has pain upon urination, low back pain, darker urine than usual. T 99.8 but no other covid symptoms per [name]. [name] scheduled appt with Romilda Garret NP 10/10/21 at 2 pm at [facility]. Sending note to Romilda Garret NP and Anastasiya CMA.

## 2021-10-12 ENCOUNTER — Telehealth: Payer: Self-pay | Admitting: Family Medicine

## 2021-10-12 NOTE — Telephone Encounter (Signed)
Asking lab to finalize culture results

## 2021-10-12 NOTE — Telephone Encounter (Signed)
I am waiting for his urine culture to come back before I change anitbiotics

## 2021-10-12 NOTE — Telephone Encounter (Signed)
Pt called stating that the medication cephALEXin (KEFLEX) 500 MG capsule that Matt prescribed is not working. Pt is asking if Dr Damita Dunnings can prescribe something else. Please advise,.

## 2021-10-12 NOTE — Telephone Encounter (Signed)
[name] checked with lab and culture is still in incubation stage, culture overall can take 3 to 5 days to result. Called patient and left detailed message-ok per DPR on file, that we are waiting on the results in order to know which medication would work for his infection.

## 2021-10-15 ENCOUNTER — Other Ambulatory Visit: Payer: Self-pay | Admitting: Nurse Practitioner

## 2021-10-15 ENCOUNTER — Telehealth: Payer: Self-pay | Admitting: Family Medicine

## 2021-10-15 DIAGNOSIS — N390 Urinary tract infection, site not specified: Secondary | ICD-10-CM

## 2021-10-15 DIAGNOSIS — R319 Hematuria, unspecified: Secondary | ICD-10-CM

## 2021-10-15 LAB — URINE CULTURE
MICRO NUMBER:: 12949222
SPECIMEN QUALITY:: ADEQUATE

## 2021-10-15 MED ORDER — AMOXICILLIN-POT CLAVULANATE 875-125 MG PO TABS
1.0000 | ORAL_TABLET | Freq: Two times a day (BID) | ORAL | 0 refills | Status: AC
Start: 2021-10-15 — End: 2021-10-22

## 2021-10-15 NOTE — Telephone Encounter (Signed)
See lab result note.

## 2021-10-15 NOTE — Telephone Encounter (Signed)
Pt stated he was in for a UTI and the med prescribed is not working can he get something else? I asked the name of the med he said he did not know. He requested asap

## 2021-10-15 NOTE — Telephone Encounter (Signed)
Called and spoke with patient. After reviewing lab results Romilda Garret, NP had already changed patients rx and was called about this but patient had not checked his VM yet. I notified patient rx for augmentin was sent in for him and to stop the keflex. Patient verbalized understanding.

## 2021-10-28 ENCOUNTER — Telehealth: Payer: Self-pay | Admitting: Family Medicine

## 2021-10-28 ENCOUNTER — Other Ambulatory Visit: Payer: Self-pay | Admitting: Family Medicine

## 2021-10-28 NOTE — Telephone Encounter (Signed)
Please make sure his letter for the [place] printed so I can sign it then send to patient.  I thought I had already taken care of this but then I realized I had not.  I apologize.  Please notify patient.  Thanks.

## 2021-10-29 NOTE — Telephone Encounter (Signed)
LMTCB; need to see if patient wants to pick up letter or if I need to mail it to him?

## 2021-10-31 NOTE — Telephone Encounter (Signed)
Spoke with patient and he requested letter be sent to him. I have placed letter in the mail for patient.

## 2021-11-02 ENCOUNTER — Other Ambulatory Visit: Payer: Self-pay | Admitting: *Deleted

## 2021-11-02 NOTE — Patient Instructions (Signed)
Visit Information  Thank you for taking time to visit with me today. Please don't hesitate to contact me if I can be of assistance to you before our next scheduled telephone appointment.  Following are the goals we discussed today:  Current Barriers:  Knowledge Deficits related to plan of care for management of HTN  RNCM Clinical Goal(s):  Patient will verbalize understanding of plan for management of HTN through collaboration with RN Care manager, provider, and care team.   Interventions: Inter-disciplinary care team collaboration (see longitudinal plan of care) Evaluation of current treatment plan related to  self management and patient's adherence to plan as established by provider  Hypertension Interventions: Last practice recorded BP readings:  BP Readings from Last 3 Encounters:  10/10/21 (!) 168/70  06/22/21 138/80  01/01/21 (!) 165/79   Most recent eGFR/CrCl: No results found for: EGFR  No components found for: CRCL  Evaluation of current treatment plan related to hypertension self management and patient's adherence to plan as established by provider Provided education to patient re: stroke prevention, s/s of heart attack and stroke Reviewed medications with patient and discussed importance of compliance Provided assistance with obtaining home blood pressure monitor via mailed the patient a BP monitor; Discussed plans with patient for ongoing care management follow up and provided patient with direct contact information for care management team Advised patient, providing education and rationale, to monitor blood pressure daily and record, calling PCP for findings outside established parameters Provided education on prescribed diet low sodium diet  Patient Goals/Self-Care Activities: Attend all scheduled provider appointments Call pharmacy for medication refills 3-7 days in advance of running out of medications Attend church or other social activities Perform all self care  activities independently  Perform IADL's (shopping, preparing meals, housekeeping, managing finances) independently Call provider office for new concerns or questions  call the Suicide and Crisis Lifeline: 988 if experiencing a Mental Health or [place]  check blood pressure weekly choose a place to take my blood pressure (home, clinic or office, retail store) learn about high blood pressure keep a blood pressure log call doctor for signs and symptoms of high blood pressure keep all doctor appointments take medications for blood pressure exactly as prescribed limit salt intake to 2300 mg/day  Follow Up Plan:  Telephone follow up appointment with care management team member scheduled for:  February The patient has been provided with contact information for the care management team and has been advised to call with any health related questions or concerns.  Patient will monitor blood pressure at home  RN provided education on the signs and symptoms of Stroke RN provided education on  the signs and symptoms of Heart Attack RN provided educational on hypertension  Our next appointment is by telephone on May 2023  Please call Johny Shock RN at [phone] if you need to cancel or reschedule your appointment.   Please call the Suicide and Crisis Lifeline: 988 if you are experiencing a Mental Health or [place] or need someone to talk to.  The patient verbalized understanding of instructions, educational materials, and care plan provided today and agreed to receive a mailed copy of patient instructions, educational materials, and care plan.   Telephone follow up appointment with care management team member scheduled for: The patient has been provided with contact information for the care management team and has been advised to call with any health related questions or concerns.   [place]  Care  Management [phone]

## 2021-11-02 NOTE — Patient Outreach (Signed)
[facility]) Care Management RN Health Coach Note   11/02/2021 Name:  [name] [name] MRN:  [id number] DOB:  December 14, 1928  Summary: Per patient he has not been monitoring his blood pressure recently. He is monitoring the sodium in his diet. Per patient he is exercising daily by walking to the mailbox and down the street and back. He has not had any recent falls.   Recommendations/Changes made from today's visit: Monitor blood pressure and document   Subjective: [name] [name] is an 88 y.o. year old male who is a primary patient of Tonia Ghent, MD. The care management team was consulted for assistance with care management and/or care coordination needs.    RN Health Coach completed Telephone Visit today.   Objective:  Medications Reviewed Today     Reviewed by Michela Pitcher, NP (Nurse Practitioner) on 10/10/21 at 1413  Med List Status: <None>   Medication Order Taking? Sig Documenting Provider Last Dose Status Informant  acetaminophen (TYLENOL) 500 MG tablet [id number] Yes Take 2 tablets (1,000 mg total) by mouth 3 (three) times daily as needed for mild pain or moderate pain. Tonia Ghent, MD Taking Active   allopurinol (ZYLOPRIM) 300 MG tablet [id number] Yes TAKE 1 TABLET DAILY Tonia Ghent, MD Taking Active   aspirin 81 MG tablet [id number] Yes Take 81 mg by mouth daily. [provider] Taking Active Self  atorvastatin (LIPITOR) 40 MG tablet [id number] Yes TAKE 1 TABLET DAILY Tonia Ghent, MD Taking Active   carvedilol (COREG CR) 20 MG 24 hr capsule [id number] Yes Take 1 capsule (20 mg total) by mouth daily. Tonia Ghent, MD Taking Active   lisinopril (ZESTRIL) 20 MG tablet [id number] Yes TAKE 1 TABLET DAILY Tonia Ghent, MD Taking Active   melatonin 3 MG TABS tablet [id number] Yes Take 1 tablet (3 mg total) by mouth at bedtime. Bary Leriche, PA-C Taking Active   Nutritional Supplements (CARNATION BREAKFAST ESSENTIALS PO) [id number]  Yes Take 1 Package by mouth daily.  [provider] Taking Active Self  omeprazole (PRILOSEC) 40 MG capsule [id number] Yes TAKE 1 CAPSULE DAILY AS NEEDED Tonia Ghent, MD Taking Active   primidone (MYSOLINE) 50 MG tablet [id number] Yes TAKE ONE-HALF (1/2) TABLET DAILY Tonia Ghent, MD Taking Active   senna-docusate (SENOKOT-S) 8.6-50 MG tablet [id number] Yes Take 1-2 tablets by mouth daily. Tonia Ghent, MD Taking Active   tamsulosin [facility]) 0.4 MG CAPS capsule [id number] Yes TAKE 1 CAPSULE DAILY Tonia Ghent, MD Taking Active   traMADol Veatrice Bourbon) 50 MG tablet [id number] Yes Take 1 tablet (50 mg total) by mouth every 12 (twelve) hours as needed. Tonia Ghent, MD Taking Active   traZODone (DESYREL) 50 MG tablet [id number] Yes TAKE 1 TABLET AT BEDTIME Tonia Ghent, MD Taking Active   vitamin B-12 (CYANOCOBALAMIN) 1000 MCG tablet [id number] Yes Take 1 tablet (1,000 mcg total) by mouth daily. Tonia Ghent, MD Taking Active Self             SDOH:  (Social Determinants of Health) assessments and interventions performed:  SDOH Interventions    Flowsheet Row Most Recent Value  SDOH Interventions   Food Insecurity Interventions Intervention Not Indicated  Housing Interventions Intervention Not Indicated  Transportation Interventions Intervention Not Indicated       Care Plan  Review of patient past medical history, allergies, medications, health status, including review of consultants reports, laboratory and other test data, was performed as part  of comprehensive evaluation for care management services.   Care Plan : RN Care Manager Plan of Care  Updates made by Kyira Volkert, Eppie Gibson, RN since 11/02/2021 12:00 AM     Problem: Knowledge Deficit Related to Hypertension amd Care Coordination Needs   Priority: High     Long-Range Goal: Development Plan of Care for Management of Hypertension   Start Date: 07/27/2021  Expected End Date: 09/07/2022  Priority:  High  Note:   Current Barriers:  Knowledge Deficits related to plan of care for management of HTN  RNCM Clinical Goal(s):  Patient will verbalize understanding of plan for management of HTN through collaboration with RN Care manager, provider, and care team.   Interventions: Inter-disciplinary care team collaboration (see longitudinal plan of care) Evaluation of current treatment plan related to  self management and patient's adherence to plan as established by provider  Hypertension Interventions: Last practice recorded BP readings:  BP Readings from Last 3 Encounters:  10/10/21 (!) 168/70  06/22/21 138/80  01/01/21 (!) 165/79  Most recent eGFR/CrCl: No results found for: EGFR  No components found for: CRCL  Evaluation of current treatment plan related to hypertension self management and patient's adherence to plan as established by provider Provided education to patient re: stroke prevention, s/s of heart attack and stroke Reviewed medications with patient and discussed importance of compliance Provided assistance with obtaining home blood pressure monitor via mailed the patient a BP monitor; Discussed plans with patient for ongoing care management follow up and provided patient with direct contact information for care management team Advised patient, providing education and rationale, to monitor blood pressure daily and record, calling PCP for findings outside established parameters Provided education on prescribed diet low sodium diet  Patient Goals/Self-Care Activities: Attend all scheduled provider appointments Call pharmacy for medication refills 3-7 days in advance of running out of medications Attend church or other social activities Perform all self care activities independently  Perform IADL's (shopping, preparing meals, housekeeping, managing finances) independently Call provider office for new concerns or questions  call the Suicide and Crisis Lifeline: 988 if  experiencing a Mental Health or [place]  check blood pressure weekly choose a place to take my blood pressure (home, clinic or office, retail store) learn about high blood pressure keep a blood pressure log call doctor for signs and symptoms of high blood pressure keep all doctor appointments take medications for blood pressure exactly as prescribed limit salt intake to 2300 mg/day  Follow Up Plan:  Telephone follow up appointment with care management team member scheduled for:  February The patient has been provided with contact information for the care management team and has been advised to call with any health related questions or concerns.  Patient will monitor blood pressure at home  RN provided education on the signs and symptoms of Stroke RN provided education on  the signs and symptoms of Heart Attack RN provided educational on hypertension      Plan: Telephone follow up appointment with care management team member scheduled for:  May 2023 The patient has been provided with contact information for the care management team and has been advised to call with any health related questions or concerns.   [place] Care Management [phone]

## 2021-11-27 ENCOUNTER — Other Ambulatory Visit: Payer: Self-pay | Admitting: Family Medicine

## 2022-01-12 ENCOUNTER — Other Ambulatory Visit: Payer: Self-pay | Admitting: Family Medicine

## 2022-01-21 ENCOUNTER — Other Ambulatory Visit: Payer: Self-pay | Admitting: *Deleted

## 2022-01-21 NOTE — Patient Outreach (Signed)
[facility]) Care Management ? ?01/21/2022 ? ?[name] [name] ?Mar 01, 1929 ?[id number] ? ? ?RN Health Coach attempted follow up outreach call to patient.  Patient was unavailable. HIPPA compliance voicemail message left with return callback number. ? ?Plan: ?RN will call patient again within 30 days. ? ?Johny Shock BSN RN ?[facility] ?[phone] ? ?

## 2022-02-26 ENCOUNTER — Other Ambulatory Visit: Payer: Self-pay | Admitting: *Deleted

## 2022-02-26 NOTE — Patient Instructions (Signed)
Visit Information  Thank you for taking time to visit with me today. Please don't hesitate to contact me if I can be of assistance to you before our next scheduled telephone appointment.  Following are the goals we discussed today:  Current Barriers:  Knowledge Deficits related to plan of care for management of HTN  RNCM Clinical Goal(s):  Patient will verbalize understanding of plan for management of HTN through collaboration with RN Care manager, provider, and care team.   Interventions: Inter-disciplinary care team collaboration (see longitudinal plan of care) Evaluation of current treatment plan related to  self management and patient's adherence to plan as established by provider  Hypertension Interventions: Last practice recorded BP readings:  BP Readings from Last 3 Encounters:  10/10/21 (!) 168/70  06/22/21 138/80  01/01/21 (!) 165/79   Most recent eGFR/CrCl: No results found for: EGFR  No components found for: CRCL  Evaluation of current treatment plan related to hypertension self management and patient's adherence to plan as established by provider Provided education to patient re: stroke prevention, s/s of heart attack and stroke Reviewed medications with patient and discussed importance of compliance Provided assistance with obtaining home blood pressure monitor via mailed the patient a BP monitor; Discussed plans with patient for ongoing care management follow up and provided patient with direct contact information for care management team Advised patient, providing education and rationale, to monitor blood pressure daily and record, calling PCP for findings outside established parameters Provided education on prescribed diet low sodium diet  Patient Goals/Self-Care Activities: Attend all scheduled provider appointments Call pharmacy for medication refills 3-7 days in advance of running out of medications Attend church or other social activities Perform all self care  activities independently  Perform IADL's (shopping, preparing meals, housekeeping, managing finances) independently Call provider office for new concerns or questions  call the Suicide and Crisis Lifeline: 988 if experiencing a Mental Health or [place]  check blood pressure weekly choose a place to take my blood pressure (home, clinic or office, retail store) learn about high blood pressure keep a blood pressure log call doctor for signs and symptoms of high blood pressure keep all doctor appointments take medications for blood pressure exactly as prescribed limit salt intake to 2300 mg/day  Follow Up Plan:  Telephone follow up appointment with care management team member scheduled for:  February The patient has been provided with contact information for the care management team and has been advised to call with any health related questions or concerns.  Patient will monitor blood pressure at home  RN provided education on the signs and symptoms of Stroke RN provided education on  the signs and symptoms of Heart Attack RN provided educational on hypertension  [id number] Patient has not been monitoring his blood pressure. He stated that the batteries are dead. The last blood pressure was done at the office and was elevated. Patient stated that he is taking his medications as per ordered. His appetite is good. Patient is decreasing the sodium in his diet.   Plan: Patient will replace batteries RN will call patient back March 14, 2022 at 2 pm to get his readings.  Our next appointment is by telephone on March 14, 2022  Please call Johny Shock RN [phone]  if you need to cancel or reschedule your appointment.   Please call the Suicide and Crisis Lifeline: 988 if you are experiencing a Mental Health or [place] or need someone to talk to.  The patient  verbalized understanding of instructions, educational materials, and care plan provided today and agreed  to receive a mailed copy of patient instructions, educational materials, and care plan.   Telephone follow up appointment with care management team member scheduled for: The patient has been provided with contact information for the care management team and has been advised to call with any health related questions or concerns.   [facility] [phone]

## 2022-02-26 NOTE — Patient Outreach (Signed)
[facility]) Care Management [place] Note   02/26/2022 Name:  [name] [name] MRN:  [id number] DOB:  11-Dec-1928  Summary: Patient not been monitoring his blood pressure. The last blood pressure was done at the office and was elevated. Patient stated that he is taking his medications as per ordered. His appetite is good. He is decreasing the sodium in his diet.    Recommendations/Changes made from today's visit: Patient will replace batteries RN will call patient back March 14, 2022 at 2 pm to get his readings.   Subjective: [name] [name] is an 88 y.o. year old male who is a primary patient of Tonia Ghent, MD. The care management team was consulted for assistance with care management and/or care coordination needs.    RN Health Coach completed Telephone Visit today.   Objective:  Medications Reviewed Today     Reviewed by Michela Pitcher, NP (Nurse Practitioner) on 10/10/21 at 1413  Med List Status: <None>   Medication Order Taking? Sig Documenting Provider Last Dose Status Informant  acetaminophen (TYLENOL) 500 MG tablet [id number] Yes Take 2 tablets (1,000 mg total) by mouth 3 (three) times daily as needed for mild pain or moderate pain. Tonia Ghent, MD Taking Active   allopurinol (ZYLOPRIM) 300 MG tablet [id number] Yes TAKE 1 TABLET DAILY Tonia Ghent, MD Taking Active   aspirin 81 MG tablet [id number] Yes Take 81 mg by mouth daily. [provider] Taking Active Self  atorvastatin (LIPITOR) 40 MG tablet [id number] Yes TAKE 1 TABLET DAILY Tonia Ghent, MD Taking Active   carvedilol (COREG CR) 20 MG 24 hr capsule [id number] Yes Take 1 capsule (20 mg total) by mouth daily. Tonia Ghent, MD Taking Active   lisinopril (ZESTRIL) 20 MG tablet [id number] Yes TAKE 1 TABLET DAILY Tonia Ghent, MD Taking Active   melatonin 3 MG TABS tablet [id number] Yes Take 1 tablet (3 mg total) by mouth at bedtime. Bary Leriche, PA-C Taking Active    Nutritional Supplements (CARNATION BREAKFAST ESSENTIALS PO) [id number] Yes Take 1 Package by mouth daily.  [provider] Taking Active Self  omeprazole (PRILOSEC) 40 MG capsule [id number] Yes TAKE 1 CAPSULE DAILY AS NEEDED Tonia Ghent, MD Taking Active   primidone (MYSOLINE) 50 MG tablet [id number] Yes TAKE ONE-HALF (1/2) TABLET DAILY Tonia Ghent, MD Taking Active   senna-docusate (SENOKOT-S) 8.6-50 MG tablet [id number] Yes Take 1-2 tablets by mouth daily. Tonia Ghent, MD Taking Active   tamsulosin [facility]) 0.4 MG CAPS capsule [id number] Yes TAKE 1 CAPSULE DAILY Tonia Ghent, MD Taking Active   traMADol Veatrice Bourbon) 50 MG tablet [id number] Yes Take 1 tablet (50 mg total) by mouth every 12 (twelve) hours as needed. Tonia Ghent, MD Taking Active   traZODone (DESYREL) 50 MG tablet [id number] Yes TAKE 1 TABLET AT BEDTIME Tonia Ghent, MD Taking Active   vitamin B-12 (CYANOCOBALAMIN) 1000 MCG tablet [id number] Yes Take 1 tablet (1,000 mcg total) by mouth daily. Tonia Ghent, MD Taking Active Self             SDOH:  (Social Determinants of Health) assessments and interventions performed:    Care Plan  Review of patient past medical history, allergies, medications, health status, including review of consultants reports, laboratory and other test data, was performed as part of comprehensive evaluation for care management services.   Care Plan : RN Care Manager Plan of Care  Updates made by Anothony Bursch,  Eppie Gibson, RN since 02/26/2022 12:00 AM     Problem: Knowledge Deficit Related to Hypertension amd Care Coordination Needs   Priority: High     Long-Range Goal: Development Plan of Care for Management of Hypertension   Start Date: 07/27/2021  Expected End Date: 09/07/2022  Priority: High  Note:   Current Barriers:  Knowledge Deficits related to plan of care for management of HTN  RNCM Clinical Goal(s):  Patient will verbalize understanding of plan for  management of HTN through collaboration with RN Care manager, provider, and care team.   Interventions: Inter-disciplinary care team collaboration (see longitudinal plan of care) Evaluation of current treatment plan related to  self management and patient's adherence to plan as established by provider  Hypertension Interventions: Last practice recorded BP readings:  BP Readings from Last 3 Encounters:  10/10/21 (!) 168/70  06/22/21 138/80  01/01/21 (!) 165/79  Most recent eGFR/CrCl: No results found for: EGFR  No components found for: CRCL  Evaluation of current treatment plan related to hypertension self management and patient's adherence to plan as established by provider Provided education to patient re: stroke prevention, s/s of heart attack and stroke Reviewed medications with patient and discussed importance of compliance Provided assistance with obtaining home blood pressure monitor via mailed the patient a BP monitor; Discussed plans with patient for ongoing care management follow up and provided patient with direct contact information for care management team Advised patient, providing education and rationale, to monitor blood pressure daily and record, calling PCP for findings outside established parameters Provided education on prescribed diet low sodium diet  Patient Goals/Self-Care Activities: Attend all scheduled provider appointments Call pharmacy for medication refills 3-7 days in advance of running out of medications Attend church or other social activities Perform all self care activities independently  Perform IADL's (shopping, preparing meals, housekeeping, managing finances) independently Call provider office for new concerns or questions  call the Suicide and Crisis Lifeline: 988 if experiencing a Mental Health or [place]  check blood pressure weekly choose a place to take my blood pressure (home, clinic or office, retail store) learn about high  blood pressure keep a blood pressure log call doctor for signs and symptoms of high blood pressure keep all doctor appointments take medications for blood pressure exactly as prescribed limit salt intake to 2300 mg/day  Follow Up Plan:  Telephone follow up appointment with care management team member scheduled for:  February The patient has been provided with contact information for the care management team and has been advised to call with any health related questions or concerns.  Patient will monitor blood pressure at home  RN provided education on the signs and symptoms of Stroke RN provided education on  the signs and symptoms of Heart Attack RN provided educational on hypertension  [id number] Patient has not been monitoring his blood pressure. He stated that the batteries are dead. The last blood pressure was done at the office and was elevated. Patient stated that he is taking his medications as per ordered. His appetite is good. Patient is decreasing the sodium in his diet.   Plan: Patient will replace batteries RN will call patient back March 14, 2022 at 2 pm to get his readings.      Plan: Telephone follow up appointment with care management team member scheduled for:  March 14, 2022 The patient has been provided with contact information for the care management team and has been advised to call with any health related  questions or concerns.   [place] [phone]

## 2022-03-14 ENCOUNTER — Other Ambulatory Visit: Payer: Self-pay | Admitting: *Deleted

## 2022-03-14 NOTE — Patient Instructions (Addendum)
Visit Information  Thank you for taking time to visit with me today. Please don't hesitate to contact me if I can be of assistance to you before our next scheduled telephone appointment.  Current Barriers:  Knowledge Deficits related to plan of care for management of HTN  RNCM Clinical Goal(s):  Patient will verbalize understanding of plan for management of HTN through collaboration with RN Care manager, provider, and care team.   Interventions: Inter-disciplinary care team collaboration (see longitudinal plan of care) Evaluation of current treatment plan related to  self management and patient's adherence to plan as established by provider  Hypertension Interventions: Last practice recorded BP readings:  BP Readings from Last 3 Encounters:  10/10/21 (!) 168/70  06/22/21 138/80  01/01/21 (!) 165/79   Most recent eGFR/CrCl: No results found for: EGFR  No components found for: CRCL  Evaluation of current treatment plan related to hypertension self management and patient's adherence to plan as established by provider Provided education to patient re: stroke prevention, s/s of heart attack and stroke Reviewed medications with patient and discussed importance of compliance Provided assistance with obtaining home blood pressure monitor via mailed the patient a BP monitor; Discussed plans with patient for ongoing care management follow up and provided patient with direct contact information for care management team Advised patient, providing education and rationale, to monitor blood pressure daily and record, calling PCP for findings outside established parameters Provided education on prescribed diet low sodium diet  Patient Goals/Self-Care Activities: Attend all scheduled provider appointments Call pharmacy for medication refills 3-7 days in advance of running out of medications Attend church or other social activities Perform all self care activities independently  Perform IADL's  (shopping, preparing meals, housekeeping, managing finances) independently Call provider office for new concerns or questions  call the Suicide and Crisis Lifeline: 988 if experiencing a Mental Health or [place]  check blood pressure weekly choose a place to take my blood pressure (home, clinic or office, retail store) learn about high blood pressure keep a blood pressure log call doctor for signs and symptoms of high blood pressure keep all doctor appointments take medications for blood pressure exactly as prescribed limit salt intake to 2300 mg/day  Follow Up Plan:  Telephone follow up appointment with care management team member scheduled for:  February The patient has been provided with contact information for the care management team and has been advised to call with any health related questions or concerns.  Patient will monitor blood pressure at home  RN provided education on the signs and symptoms of Stroke RN provided education on  the signs and symptoms of Heart Attack RN provided educational on hypertension  [id number] Patient has not been monitoring his blood pressure. He stated that the batteries are dead. The last blood pressure was done at the office and was elevated. Patient stated that he is taking his medications as per ordered. His appetite is good. Patient is decreasing the sodium in his diet.   Plan: Patient will replace batteries RN will call patient back March 14, 2022 at 2 pm to get his readings.  [id number] patient has been monitoring blood pressures at home. Readings are ranging from 141-78-156/86. Patient states he does walk some but he feels he is getting weaker.  Plan: Patient will monitor blood pressure 2-3 times a week. RN will send patient chair exercises that he will try to perform by next outreach.  Our next appointment is by telephone on May 14, 2022  Please call Johny Shock RN [phone] if you need to cancel or reschedule your  appointment.   Please call the Suicide and Crisis Lifeline: 988 if you are experiencing a Mental Health or [place] or need someone to talk to.  The patient verbalized understanding of instructions, educational materials, and care plan provided today and agreed to receive a mailed copy of patient instructions, educational materials, and care plan.   Telephone follow up appointment with care management team member scheduled for: The patient has been provided with contact information for the care management team and has been advised to call with any health related questions or concerns.   [place] [phone]

## 2022-03-14 NOTE — Patient Outreach (Signed)
[facility]) Care Management  03/14/2022  [name] [name] 1929/01/10 [id number]   RN Health Coach attempted follow up outreach call to patient.  Patient was unavailable. HIPPA compliance voicemail message left with return callback number.  Plan: RN will call patient again within 30 days.  [place] [facility] [phone]

## 2022-03-14 NOTE — Patient Outreach (Signed)
[name]   03/14/2022 Name:  [name] [name] MRN:  [id number] DOB:  1929-06-01  Summary: Patient has started monitoring blood pressures at home. Readings are ranging from 141-78-156/86. Patient states he does walk some but he feels he is getting weaker.   Recommendations/Changes made from today's visit: Patient will monitor blood pressure 2-3 times a week.  RN will send patient chair exercises that he will try to perform by next outreach.   Subjective: [name] [name] is an 88 y.o. year old male who is a primary patient of Tonia Ghent, MD. The care management team was consulted for assistance with care management and/or care coordination needs.    RN Health Coach completed Telephone Visit today.   Objective:  Medications Reviewed Today     Reviewed by Michela Pitcher, NP (Nurse Practitioner) on 10/10/21 at 1413  Med List Status: <None>   Medication Order Taking? Sig Documenting Provider Last Dose Status Informant  acetaminophen (TYLENOL) 500 MG tablet [id number] Yes Take 2 tablets (1,000 mg total) by mouth 3 (three) times daily as needed for mild pain or moderate pain. Tonia Ghent, MD Taking Active   allopurinol (ZYLOPRIM) 300 MG tablet [id number] Yes TAKE 1 TABLET DAILY Tonia Ghent, MD Taking Active   aspirin 81 MG tablet [id number] Yes Take 81 mg by mouth daily. [provider] Taking Active Self  atorvastatin (LIPITOR) 40 MG tablet [id number] Yes TAKE 1 TABLET DAILY Tonia Ghent, MD Taking Active   carvedilol (COREG CR) 20 MG 24 hr capsule [id number] Yes Take 1 capsule (20 mg total) by mouth daily. Tonia Ghent, MD Taking Active   lisinopril (ZESTRIL) 20 MG tablet [id number] Yes TAKE 1 TABLET DAILY Tonia Ghent, MD Taking Active   melatonin 3 MG TABS tablet [id number] Yes Take 1 tablet (3 mg total) by mouth at bedtime. Bary Leriche, PA-C Taking Active   Nutritional Supplements (CARNATION  BREAKFAST ESSENTIALS PO) [id number] Yes Take 1 Package by mouth daily.  [provider] Taking Active Self  omeprazole (PRILOSEC) 40 MG capsule [id number] Yes TAKE 1 CAPSULE DAILY AS NEEDED Tonia Ghent, MD Taking Active   primidone (MYSOLINE) 50 MG tablet [id number] Yes TAKE ONE-HALF (1/2) TABLET DAILY Tonia Ghent, MD Taking Active   senna-docusate (SENOKOT-S) 8.6-50 MG tablet [id number] Yes Take 1-2 tablets by mouth daily. Tonia Ghent, MD Taking Active   tamsulosin [facility]) 0.4 MG CAPS capsule [id number] Yes TAKE 1 CAPSULE DAILY Tonia Ghent, MD Taking Active   traMADol Veatrice Bourbon) 50 MG tablet [id number] Yes Take 1 tablet (50 mg total) by mouth every 12 (twelve) hours as needed. Tonia Ghent, MD Taking Active   traZODone (DESYREL) 50 MG tablet [id number] Yes TAKE 1 TABLET AT BEDTIME Tonia Ghent, MD Taking Active   vitamin B-12 (CYANOCOBALAMIN) 1000 MCG tablet [id number] Yes Take 1 tablet (1,000 mcg total) by mouth daily. Tonia Ghent, MD Taking Active Self             SDOH:  (Social Determinants of Health) assessments and interventions performed:    Care Plan  Review of patient past medical history, allergies, medications, health status, including review of consultants reports, laboratory and other test data, was performed as part of comprehensive evaluation for care management services.   Care Plan : RN Care Manager Plan of Care  Updates made by Spero Gunnels, Eppie Gibson, RN since 03/14/2022 12:00 AM  Problem: Knowledge Deficit Related to Hypertension amd Care Coordination Needs   Priority: High     Long-Range Goal: Development Plan of Care for Management of Hypertension   Start Date: 07/27/2021  Expected End Date: 09/07/2022  Priority: High  Note:   Current Barriers:  Knowledge Deficits related to plan of care for management of HTN  RNCM Clinical Goal(s):  Patient will verbalize understanding of plan for management of HTN through collaboration with  RN Care manager, provider, and care team.   Interventions: Inter-disciplinary care team collaboration (see longitudinal plan of care) Evaluation of current treatment plan related to  self management and patient's adherence to plan as established by provider  Hypertension Interventions: Last practice recorded BP readings:  BP Readings from Last 3 Encounters:  10/10/21 (!) 168/70  06/22/21 138/80  01/01/21 (!) 165/79  Most recent eGFR/CrCl: No results found for: EGFR  No components found for: CRCL  Evaluation of current treatment plan related to hypertension self management and patient's adherence to plan as established by provider Provided education to patient re: stroke prevention, s/s of heart attack and stroke Reviewed medications with patient and discussed importance of compliance Provided assistance with obtaining home blood pressure monitor via mailed the patient a BP monitor; Discussed plans with patient for ongoing care management follow up and provided patient with direct contact information for care management team Advised patient, providing education and rationale, to monitor blood pressure daily and record, calling PCP for findings outside established parameters Provided education on prescribed diet low sodium diet  Patient Goals/Self-Care Activities: Attend all scheduled provider appointments Call pharmacy for medication refills 3-7 days in advance of running out of medications Attend church or other social activities Perform all self care activities independently  Perform IADL's (shopping, preparing meals, housekeeping, managing finances) independently Call provider office for new concerns or questions  call the Suicide and Crisis Lifeline: 988 if experiencing a Mental Health or [place]  check blood pressure weekly choose a place to take my blood pressure (home, clinic or office, retail store) learn about high blood pressure keep a blood pressure  log call doctor for signs and symptoms of high blood pressure keep all doctor appointments take medications for blood pressure exactly as prescribed limit salt intake to 2300 mg/day  Follow Up Plan:  Telephone follow up appointment with care management team member scheduled for:  February The patient has been provided with contact information for the care management team and has been advised to call with any health related questions or concerns.  Patient will monitor blood pressure at home  RN provided education on the signs and symptoms of Stroke RN provided education on  the signs and symptoms of Heart Attack RN provided educational on hypertension  [id number] Patient has not been monitoring his blood pressure. He stated that the batteries are dead. The last blood pressure was done at the office and was elevated. Patient stated that he is taking his medications as per ordered. His appetite is good. Patient is decreasing the sodium in his diet.   Plan: Patient will replace batteries RN will call patient back March 14, 2022 at 2 pm to get his readings.  [id number] patient has been monitoring blood pressures at home. Readings are ranging from 141-78-156/86. Patient states he does walk some but he feels he is getting weaker.  Plan: Patient will monitor blood pressure 2-3 times a week. RN will send patient chair exercises that he will try to perform by next outreach.  Plan: Telephone follow up appointment with care management team member scheduled for:  May 18, 2022 The patient has been provided with contact information for the care management team and has been advised to call with any health related questions or concerns.   [place] Care Management [phone]

## 2022-04-01 ENCOUNTER — Ambulatory Visit: Payer: Self-pay | Admitting: *Deleted

## 2022-04-01 NOTE — Patient Outreach (Signed)
[facility]) Care Management  04/01/2022  JESAIAH FABIANO [birth year] [phone]  RN Health Coach attempted follow up outreach call to patient.  Patient was unavailable. HIPPA compliance voicemail message left with return callback number. RN Health Coach case closure. Patient bp are still elevated. RN referred to Care Coordinator to continue outreach calls.  Plan: Referred to Care Coordinator to continue outreach calls. RN Health Coach Case Closure  [place] Care Management [phone]

## 2022-04-04 ENCOUNTER — Other Ambulatory Visit: Payer: Self-pay | Admitting: Family Medicine

## 2022-05-14 ENCOUNTER — Ambulatory Visit: Payer: Medicare Other | Admitting: *Deleted

## 2022-05-15 ENCOUNTER — Ambulatory Visit: Payer: Self-pay

## 2022-05-15 NOTE — Patient Outreach (Signed)
  Care Coordination   05/15/2022 Name: [name] [name] MRN: [id number] DOB: [birth year]   Care Coordination Outreach Attempts:  An unsuccessful telephone outreach was attempted for a scheduled appointment today.  Follow Up Plan:  Additional outreach attempts will be made to offer the patient care coordination information and services.   Encounter Outcome:  No Answer  Care Coordination Interventions Activated:  No   Care Coordination Interventions:  No, not indicated    Quinn Plowman RN,BSN,CCM RN Care Manager Coordinator [phone]

## 2022-05-28 ENCOUNTER — Other Ambulatory Visit: Payer: Self-pay | Admitting: Family Medicine

## 2022-07-18 DIAGNOSIS — Z23 Encounter for immunization: Secondary | ICD-10-CM | POA: Diagnosis not present

## 2022-08-13 ENCOUNTER — Emergency Department (HOSPITAL_COMMUNITY): Payer: Medicare Other

## 2022-08-13 ENCOUNTER — Observation Stay (HOSPITAL_COMMUNITY)
Admission: EM | Admit: 2022-08-13 | Discharge: 2022-08-16 | Disposition: A | Discharge status: Home Health | Payer: Medicare Other | Attending: Internal Medicine | Admitting: Internal Medicine

## 2022-08-13 ENCOUNTER — Encounter (HOSPITAL_COMMUNITY): Payer: Self-pay

## 2022-08-13 ENCOUNTER — Other Ambulatory Visit: Payer: Self-pay

## 2022-08-13 DIAGNOSIS — Z7982 Long term (current) use of aspirin: Secondary | ICD-10-CM | POA: Insufficient documentation

## 2022-08-13 DIAGNOSIS — B957 Other staphylococcus as the cause of diseases classified elsewhere: Secondary | ICD-10-CM | POA: Insufficient documentation

## 2022-08-13 DIAGNOSIS — Z79899 Other long term (current) drug therapy: Secondary | ICD-10-CM | POA: Diagnosis not present

## 2022-08-13 DIAGNOSIS — D631 Anemia in chronic kidney disease: Secondary | ICD-10-CM | POA: Diagnosis not present

## 2022-08-13 DIAGNOSIS — I4510 Unspecified right bundle-branch block: Secondary | ICD-10-CM | POA: Diagnosis not present

## 2022-08-13 DIAGNOSIS — I129 Hypertensive chronic kidney disease with stage 1 through stage 4 chronic kidney disease, or unspecified chronic kidney disease: Secondary | ICD-10-CM | POA: Insufficient documentation

## 2022-08-13 DIAGNOSIS — N1832 Chronic kidney disease, stage 3b: Secondary | ICD-10-CM | POA: Diagnosis not present

## 2022-08-13 DIAGNOSIS — R531 Weakness: Secondary | ICD-10-CM | POA: Diagnosis not present

## 2022-08-13 DIAGNOSIS — E1122 Type 2 diabetes mellitus with diabetic chronic kidney disease: Secondary | ICD-10-CM | POA: Insufficient documentation

## 2022-08-13 DIAGNOSIS — E860 Dehydration: Secondary | ICD-10-CM | POA: Insufficient documentation

## 2022-08-13 DIAGNOSIS — R627 Adult failure to thrive: Secondary | ICD-10-CM

## 2022-08-13 DIAGNOSIS — Z1152 Encounter for screening for COVID-19: Secondary | ICD-10-CM | POA: Diagnosis not present

## 2022-08-13 DIAGNOSIS — R000 Tachycardia, unspecified: Secondary | ICD-10-CM | POA: Diagnosis not present

## 2022-08-13 DIAGNOSIS — Z951 Presence of aortocoronary bypass graft: Secondary | ICD-10-CM | POA: Insufficient documentation

## 2022-08-13 DIAGNOSIS — I2510 Atherosclerotic heart disease of native coronary artery without angina pectoris: Secondary | ICD-10-CM | POA: Diagnosis not present

## 2022-08-13 DIAGNOSIS — R4182 Altered mental status, unspecified: Secondary | ICD-10-CM | POA: Diagnosis not present

## 2022-08-13 DIAGNOSIS — R2689 Other abnormalities of gait and mobility: Secondary | ICD-10-CM | POA: Diagnosis not present

## 2022-08-13 DIAGNOSIS — I10 Essential (primary) hypertension: Secondary | ICD-10-CM | POA: Diagnosis not present

## 2022-08-13 DIAGNOSIS — N3000 Acute cystitis without hematuria: Secondary | ICD-10-CM | POA: Insufficient documentation

## 2022-08-13 DIAGNOSIS — Z8546 Personal history of malignant neoplasm of prostate: Secondary | ICD-10-CM | POA: Insufficient documentation

## 2022-08-13 DIAGNOSIS — N390 Urinary tract infection, site not specified: Secondary | ICD-10-CM | POA: Diagnosis present

## 2022-08-13 LAB — URINALYSIS, ROUTINE W REFLEX MICROSCOPIC
Bacteria, UA: NONE SEEN
Bilirubin Urine: NEGATIVE
Glucose, UA: NEGATIVE mg/dL
Ketones, ur: NEGATIVE mg/dL
Nitrite: NEGATIVE
Protein, ur: >=300 mg/dL — AB
Specific Gravity, Urine: 1.011 (ref 1.005–1.030)
WBC, UA: >50 WBC/hpf — ABNORMAL HIGH (ref 0–5)
pH: 6 (ref 5.0–8.0)

## 2022-08-13 LAB — CBC WITH DIFFERENTIAL/PLATELET
Abs Immature Granulocytes: 0.08 10*3/uL — ABNORMAL HIGH (ref 0.00–0.07)
Basophils Absolute: 0 10*3/uL (ref 0.0–0.1)
Basophils Relative: 0 %
Eosinophils Absolute: 0 10*3/uL (ref 0.0–0.5)
Eosinophils Relative: 0 %
HCT: 35.6 % — ABNORMAL LOW (ref 39.0–52.0)
Hemoglobin: 12.1 g/dL — ABNORMAL LOW (ref 13.0–17.0)
Immature Granulocytes: 1 %
Lymphocytes Relative: 9 %
Lymphs Abs: 1.2 10*3/uL (ref 0.7–4.0)
MCH: 33.3 pg (ref 26.0–34.0)
MCHC: 34 g/dL (ref 30.0–36.0)
MCV: 98.1 fL (ref 80.0–100.0)
Monocytes Absolute: 1.1 10*3/uL — ABNORMAL HIGH (ref 0.1–1.0)
Monocytes Relative: 9 %
Neutro Abs: 10.2 10*3/uL — ABNORMAL HIGH (ref 1.7–7.7)
Neutrophils Relative %: 81 %
Platelets: 179 10*3/uL (ref 150–400)
RBC: 3.63 MIL/uL — ABNORMAL LOW (ref 4.22–5.81)
RDW: 14.2 % (ref 11.5–15.5)
WBC: 12.6 10*3/uL — ABNORMAL HIGH (ref 4.0–10.5)
nRBC: 0 % (ref 0.0–0.2)

## 2022-08-13 LAB — COMPREHENSIVE METABOLIC PANEL
ALT: 13 U/L (ref 0–44)
AST: 15 U/L (ref 15–41)
Albumin: 3.1 g/dL — ABNORMAL LOW (ref 3.5–5.0)
Alkaline Phosphatase: 65 U/L (ref 38–126)
Anion gap: 9 (ref 5–15)
BUN: 28 mg/dL — ABNORMAL HIGH (ref 8–23)
CO2: 22 mmol/L (ref 22–32)
Calcium: 9 mg/dL (ref 8.9–10.3)
Chloride: 103 mmol/L (ref 98–111)
Creatinine, Ser: 1.55 mg/dL — ABNORMAL HIGH (ref 0.61–1.24)
GFR, Estimated: 42 mL/min — ABNORMAL LOW (ref >60)
Glucose, Bld: 159 mg/dL — ABNORMAL HIGH (ref 70–99)
Potassium: 3.9 mmol/L (ref 3.5–5.1)
Sodium: 134 mmol/L — ABNORMAL LOW (ref 135–145)
Total Bilirubin: 1.2 mg/dL (ref 0.3–1.2)
Total Protein: 7 g/dL (ref 6.5–8.1)

## 2022-08-13 LAB — MAGNESIUM: Magnesium: 1.8 mg/dL (ref 1.7–2.4)

## 2022-08-13 LAB — SARS CORONAVIRUS 2 BY RT PCR: SARS Coronavirus 2 by RT PCR: NEGATIVE

## 2022-08-13 LAB — TROPONIN I (HIGH SENSITIVITY)
Troponin I (High Sensitivity): 15 ng/L (ref <18)
Troponin I (High Sensitivity): 16 ng/L (ref <18)

## 2022-08-13 MED ORDER — SENNOSIDES-DOCUSATE SODIUM 8.6-50 MG PO TABS
1.0000 | ORAL_TABLET | Freq: Every day | ORAL | Status: DC
  Administered 2022-08-13 – 2022-08-14 (×2): 1 via ORAL
  Filled 2022-08-13 (×2): qty 1

## 2022-08-13 MED ORDER — FLUCONAZOLE 100 MG PO TABS
100.0000 mg | ORAL_TABLET | Freq: Every day | ORAL | Status: AC
  Administered 2022-08-13 – 2022-08-16 (×4): 100 mg via ORAL
  Filled 2022-08-13 (×4): qty 1

## 2022-08-13 MED ORDER — TAMSULOSIN HCL 0.4 MG PO CAPS
0.4000 mg | ORAL_CAPSULE | Freq: Every day | ORAL | Status: DC
  Administered 2022-08-13 – 2022-08-14 (×2): 0.4 mg via ORAL
  Filled 2022-08-13 (×2): qty 1

## 2022-08-13 MED ORDER — LACTATED RINGERS IV BOLUS
1000.0000 mL | Freq: Once | INTRAVENOUS | Status: AC
  Administered 2022-08-13: 1000 mL via INTRAVENOUS

## 2022-08-13 MED ORDER — SODIUM CHLORIDE 0.9 % IV SOLN: INTRAVENOUS | Status: DC | PRN

## 2022-08-13 MED ORDER — TRAZODONE HCL 50 MG PO TABS
50.0000 mg | ORAL_TABLET | Freq: Every day | ORAL | Status: DC
  Administered 2022-08-13 – 2022-08-15 (×3): 50 mg via ORAL
  Filled 2022-08-13 (×3): qty 1

## 2022-08-13 MED ORDER — SODIUM CHLORIDE 0.9 % IV SOLN: INTRAVENOUS | Status: AC

## 2022-08-13 MED ORDER — LISINOPRIL 10 MG PO TABS: 20.0000 mg | ORAL_TABLET | Freq: Every day | ORAL | Status: DC

## 2022-08-13 MED ORDER — POLYETHYLENE GLYCOL 3350 17 G PO PACK: 17.0000 g | PACK | Freq: Every day | ORAL | Status: DC | PRN

## 2022-08-13 MED ORDER — PANTOPRAZOLE SODIUM 40 MG PO TBEC
40.0000 mg | DELAYED_RELEASE_TABLET | Freq: Every day | ORAL | Status: DC
  Administered 2022-08-13 – 2022-08-16 (×4): 40 mg via ORAL
  Filled 2022-08-13 (×4): qty 1

## 2022-08-13 MED ORDER — SODIUM CHLORIDE 0.9 % IV SOLN
2.0000 g | Freq: Once | INTRAVENOUS | Status: AC
  Administered 2022-08-13: 2 g via INTRAVENOUS
  Filled 2022-08-13: qty 20

## 2022-08-13 MED ORDER — SODIUM CHLORIDE 0.9% FLUSH
3.0000 mL | Freq: Two times a day (BID) | INTRAVENOUS | Status: DC
  Administered 2022-08-13 – 2022-08-16 (×7): 3 mL via INTRAVENOUS

## 2022-08-13 MED ORDER — HEPARIN SODIUM (PORCINE) 5000 UNIT/ML IJ SOLN
5000.0000 [IU] | Freq: Three times a day (TID) | INTRAMUSCULAR | Status: DC
  Administered 2022-08-13 – 2022-08-16 (×9): 5000 [IU] via SUBCUTANEOUS
  Filled 2022-08-13 (×9): qty 1

## 2022-08-13 MED ORDER — CARVEDILOL PHOSPHATE ER 20 MG PO CP24
20.0000 mg | ORAL_CAPSULE | Freq: Every day | ORAL | Status: DC
  Administered 2022-08-14 – 2022-08-16 (×3): 20 mg via ORAL
  Filled 2022-08-13 (×7): qty 1

## 2022-08-13 MED ORDER — SODIUM CHLORIDE 0.9% FLUSH
3.0000 mL | Freq: Two times a day (BID) | INTRAVENOUS | Status: DC
  Administered 2022-08-14 – 2022-08-16 (×5): 3 mL via INTRAVENOUS

## 2022-08-13 MED ORDER — TRAMADOL HCL 50 MG PO TABS: 50.0000 mg | ORAL_TABLET | Freq: Two times a day (BID) | ORAL | Status: DC | PRN

## 2022-08-13 MED ORDER — VITAMIN B-12 1000 MCG PO TABS
1000.0000 ug | ORAL_TABLET | Freq: Every day | ORAL | Status: DC
  Administered 2022-08-13 – 2022-08-16 (×4): 1000 ug via ORAL
  Filled 2022-08-13 (×4): qty 1

## 2022-08-13 MED ORDER — ALLOPURINOL 100 MG PO TABS
300.0000 mg | ORAL_TABLET | Freq: Every day | ORAL | Status: DC
  Administered 2022-08-13 – 2022-08-16 (×4): 300 mg via ORAL
  Filled 2022-08-13: qty 1
  Filled 2022-08-13 (×3): qty 3

## 2022-08-13 MED ORDER — SODIUM CHLORIDE 0.9% FLUSH: 3.0000 mL | INTRAVENOUS | Status: DC | PRN

## 2022-08-13 MED ORDER — ATORVASTATIN CALCIUM 40 MG PO TABS
40.0000 mg | ORAL_TABLET | Freq: Every day | ORAL | Status: DC
  Administered 2022-08-13 – 2022-08-16 (×4): 40 mg via ORAL
  Filled 2022-08-13 (×4): qty 1

## 2022-08-13 MED ORDER — MELATONIN 3 MG PO TABS
3.0000 mg | ORAL_TABLET | Freq: Every day | ORAL | Status: DC
  Administered 2022-08-13 – 2022-08-15 (×3): 3 mg via ORAL
  Filled 2022-08-13 (×3): qty 1

## 2022-08-13 MED ORDER — SODIUM CHLORIDE 0.9 % IV SOLN
1.0000 g | INTRAVENOUS | Status: DC
  Administered 2022-08-14 – 2022-08-15 (×2): 1 g via INTRAVENOUS
  Filled 2022-08-13 (×2): qty 10

## 2022-08-13 MED ORDER — PRIMIDONE 50 MG PO TABS
25.0000 mg | ORAL_TABLET | Freq: Every day | ORAL | Status: DC
  Administered 2022-08-13 – 2022-08-16 (×4): 25 mg via ORAL
  Filled 2022-08-13 (×4): qty 1

## 2022-08-13 MED ORDER — TRAZODONE HCL 50 MG PO TABS: 50.0000 mg | ORAL_TABLET | Freq: Every evening | ORAL | Status: DC | PRN

## 2022-08-13 MED ORDER — ONDANSETRON HCL 4 MG/2ML IJ SOLN: 4.0000 mg | Freq: Four times a day (QID) | INTRAMUSCULAR | Status: DC | PRN

## 2022-08-13 MED ORDER — ASPIRIN 81 MG PO TBEC
81.0000 mg | DELAYED_RELEASE_TABLET | Freq: Every day | ORAL | Status: DC
  Administered 2022-08-13 – 2022-08-16 (×4): 81 mg via ORAL
  Filled 2022-08-13 (×7): qty 1

## 2022-08-13 MED ORDER — ACETAMINOPHEN 650 MG RE SUPP: 650.0000 mg | Freq: Four times a day (QID) | RECTAL | Status: DC | PRN

## 2022-08-13 MED ORDER — BISACODYL 10 MG RE SUPP: 10.0000 mg | Freq: Every day | RECTAL | Status: DC | PRN

## 2022-08-13 MED ORDER — ACETAMINOPHEN 325 MG PO TABS
650.0000 mg | ORAL_TABLET | Freq: Four times a day (QID) | ORAL | Status: DC | PRN
  Administered 2022-08-13 (×2): 650 mg via ORAL
  Filled 2022-08-13 (×2): qty 2

## 2022-08-13 MED ORDER — ONDANSETRON HCL 4 MG PO TABS: 4.0000 mg | ORAL_TABLET | Freq: Four times a day (QID) | ORAL | Status: DC | PRN

## 2022-08-13 NOTE — ED Notes (Signed)
Urinal at bedside. Aware of need for urine sample.

## 2022-08-13 NOTE — Progress Notes (Signed)
Verbal report given to assigned nurse on Unit 300.

## 2022-08-13 NOTE — ED Notes (Signed)
Family at bedside. 

## 2022-08-13 NOTE — ED Provider Notes (Signed)
[facility] Provider Note   CSN: [id number] Arrival date & time: 08/13/22  4098     History Chief Complaint  Patient presents with   Weakness    KYHEEM BATHGATE is a 88 y.o. male.  88 year old male the presents the ER with his daughter and son-in-law secondary to generalized weakness and dysuria.  Patient usually gets around without difficulty but the last few days has had to use a walker secondary to generalized weakness.  Today got a lot worse to the point that he could not walk anymore.  He states was weakness in both legs but not 1 particular area.  No fevers.  No area, nausea or vomiting.  Has felt some chills once in a while.  No trauma.   Weakness      Home Medications Prior to Admission medications   Medication Sig Start Date End Date Taking? Authorizing Provider  acetaminophen (TYLENOL) 500 MG tablet Take 2 tablets (1,000 mg total) by mouth 3 (three) times daily as needed for mild pain or moderate pain. 06/06/20  Yes Tonia Ghent, MD  allopurinol (ZYLOPRIM) 300 MG tablet TAKE 1 TABLET DAILY 05/28/22  Yes Tonia Ghent, MD  aspirin 81 MG tablet Take 81 mg by mouth daily.   Yes [provider]  atorvastatin (LIPITOR) 40 MG tablet TAKE 1 TABLET DAILY 05/28/22  Yes Tonia Ghent, MD  carvedilol (COREG CR) 20 MG 24 hr capsule Take 1 capsule (20 mg total) by mouth daily. 07/02/21  Yes Tonia Ghent, MD  lisinopril (ZESTRIL) 20 MG tablet TAKE 1 TABLET DAILY 05/28/22  Yes Tonia Ghent, MD  melatonin 3 MG TABS tablet Take 1 tablet (3 mg total) by mouth at bedtime. 05/17/20  Yes Love, Ivan Anchors, PA-C  omeprazole (PRILOSEC) 40 MG capsule TAKE 1 CAPSULE DAILY AS NEEDED 01/14/22  Yes Tonia Ghent, MD  primidone (MYSOLINE) 50 MG tablet TAKE ONE-HALF (1/2) TABLET DAILY 05/28/22  Yes Tonia Ghent, MD  senna-docusate (SENOKOT-S) 8.6-50 MG tablet Take 1-2 tablets by mouth daily. 06/22/21  Yes Tonia Ghent, MD  tamsulosin [facility]) 0.4 MG  CAPS capsule TAKE 1 CAPSULE DAILY 04/04/22  Yes Tonia Ghent, MD  traZODone (DESYREL) 50 MG tablet TAKE 1 TABLET AT BEDTIME 11/27/21  Yes Tonia Ghent, MD  vitamin B-12 (CYANOCOBALAMIN) 1000 MCG tablet Take 1 tablet (1,000 mcg total) by mouth daily. 01/20/20  Yes Tonia Ghent, MD  traMADol (ULTRAM) 50 MG tablet Take 1 tablet (50 mg total) by mouth every 12 (twelve) hours as needed. Patient not taking: Reported on 08/13/2022 07/02/21   Tonia Ghent, MD      Allergies    Sulfonamide derivatives    Review of Systems   Review of Systems  Neurological:  Positive for weakness.    Physical Exam Updated Vital Signs BP (!) 147/71 (BP Location: Left Arm)   Pulse 68   Temp 98.2 F (36.8 C)   Resp 20   Ht '5\' 11"'$  (1.803 m)   Wt 81.1 kg Comment: standing scale  SpO2 97%   BMI 24.94 kg/m  Physical Exam Vitals and nursing note reviewed.  Constitutional:      Appearance: He is well-developed.  HENT:     Head: Normocephalic and atraumatic.  Cardiovascular:     Rate and Rhythm: Normal rate.  Pulmonary:     Effort: Pulmonary effort is normal. No respiratory distress.  Abdominal:     General: There is no  distension.  Musculoskeletal:        General: Normal range of motion.     Cervical back: Normal range of motion.  Skin:    General: Skin is warm and dry.  Neurological:     General: No focal deficit present.     Mental Status: He is alert.     Comments: Generally weak but able to dorsiflex/plantarflex both sides.  Able to lift both legs off the bed for 10 seconds.  Able to use both biceps and hands appropriately.  Face is symmetric with equal eyebrow raise, extraocular movements intact, normal tongue protrusion.     ED Results / Procedures / Treatments   Labs (all labs ordered are listed, but only abnormal results are displayed) Labs Reviewed  CBC WITH DIFFERENTIAL/PLATELET - Abnormal; Notable for the following components:      Result Value   WBC 12.6 (*)    RBC 3.63  (*)    Hemoglobin 12.1 (*)    HCT 35.6 (*)    Neutro Abs 10.2 (*)    Monocytes Absolute 1.1 (*)    Abs Immature Granulocytes 0.08 (*)    All other components within normal limits  COMPREHENSIVE METABOLIC PANEL - Abnormal; Notable for the following components:   Sodium 134 (*)    Glucose, Bld 159 (*)    BUN 28 (*)    Creatinine, Ser 1.55 (*)    Albumin 3.1 (*)    GFR, Estimated 42 (*)    All other components within normal limits  URINALYSIS, ROUTINE W REFLEX MICROSCOPIC - Abnormal; Notable for the following components:   APPearance HAZY (*)    Hgb urine dipstick SMALL (*)    Protein, ur >=300 (*)    Leukocytes,Ua MODERATE (*)    WBC, UA >50 (*)    All other components within normal limits  CULTURE, BLOOD (ROUTINE X 2)  SARS CORONAVIRUS 2 BY RT PCR  URINE CULTURE  CULTURE, BLOOD (ROUTINE X 2)  MAGNESIUM  BASIC METABOLIC PANEL  CBC  TROPONIN I (HIGH SENSITIVITY)  TROPONIN I (HIGH SENSITIVITY)    EKG EKG Interpretation  Date/Time:  Tuesday August 13 2022 02:51:43 EST Ventricular Rate:  73 PR Interval:  214 QRS Duration: 148 QT Interval:  407 QTC Calculation: 449 R Axis:   -39 Text Interpretation: Sinus or ectopic atrial rhythm Borderline prolonged PR interval Right bundle branch block Confirmed by Merrily Pew [phone]) on 08/13/2022 4:54:06 AM  Radiology DG Chest Portable 1 View  Result Date: 08/13/2022 CLINICAL DATA:  Generalized weakness. EXAM: PORTABLE CHEST 1 VIEW COMPARISON:  05/05/2020. FINDINGS: The heart size and mediastinal contours are stable. There is atherosclerotic calcification of the aorta. No consolidation, effusion, or pneumothorax. Sternotomy wires are present over the midline. IMPRESSION: No active disease. Electronically Signed   By: Brett Fairy M.D.   On: 08/13/2022 03:24   CT Head Wo Contrast  Result Date: 08/13/2022 CLINICAL DATA:  Mental status change, unknown cause. EXAM: CT HEAD WITHOUT CONTRAST TECHNIQUE: Contiguous axial images were  obtained from the base of the skull through the vertex without intravenous contrast. RADIATION DOSE REDUCTION: This exam was performed according to the departmental dose-optimization program which includes automated exposure control, adjustment of the mA and/or kV according to patient size and/or use of iterative reconstruction technique. COMPARISON:  03/10/2020. FINDINGS: Brain: No acute intracranial hemorrhage, midline shift or mass effect. No extra-axial fluid collection. Diffuse atrophy is noted. Extensive periventricular white matter hypodensities are seen bilaterally. No hydrocephalus. Vascular: Atherosclerotic calcification of  the carotid siphons and vertebral arteries. No hyperdense vessel. Skull: Normal. Negative for fracture or focal lesion. Sinuses/Orbits: Mild mucosal thickening in the ethmoid air cells, sphenoid sinus, and maxillary sinuses. No acute orbital abnormality. Other: None. IMPRESSION: 1. No acute intracranial process. 2. Atrophy with chronic microvascular ischemic changes. 3. Chronic paranasal sinus disease. Electronically Signed   By: Brett Fairy M.D.   On: 08/13/2022 03:23    Procedures Procedures   Medications Ordered in ED Medications  allopurinol (ZYLOPRIM) tablet 300 mg (300 mg Oral Given 08/13/22 1015)  aspirin EC tablet 81 mg (81 mg Oral Given 08/13/22 1008)  atorvastatin (LIPITOR) tablet 40 mg (40 mg Oral Given 08/13/22 1008)  carvedilol (COREG CR) 24 hr capsule 20 mg (20 mg Oral Not Given 08/13/22 1310)  traZODone (DESYREL) tablet 50 mg (50 mg Oral Given 08/13/22 2141)  pantoprazole (PROTONIX) EC tablet 40 mg (40 mg Oral Given 08/13/22 1008)  senna-docusate (Senokot-S) tablet 1-2 tablet (1 tablet Oral Given 08/13/22 1008)  tamsulosin (FLOMAX) capsule 0.4 mg (0.4 mg Oral Given 08/13/22 1008)  cyanocobalamin (VITAMIN B12) tablet 1,000 mcg (1,000 mcg Oral Given 08/13/22 1008)  melatonin tablet 3 mg (3 mg Oral Given 08/13/22 2141)  primidone (MYSOLINE) tablet 25 mg (25 mg Oral  Given 08/13/22 1016)  sodium chloride flush (NS) 0.9 % injection 3 mL (3 mLs Intravenous Not Given 08/13/22 2141)  sodium chloride flush (NS) 0.9 % injection 3 mL (3 mLs Intravenous Given 08/13/22 2141)  sodium chloride flush (NS) 0.9 % injection 3 mL (has no administration in time range)  0.9 %  sodium chloride infusion (has no administration in time range)  acetaminophen (TYLENOL) tablet 650 mg (650 mg Oral Given 08/13/22 1030)    Or  acetaminophen (TYLENOL) suppository 650 mg ( Rectal See Alternative 08/13/22 1030)  polyethylene glycol (MIRALAX / GLYCOLAX) packet 17 g (has no administration in time range)  bisacodyl (DULCOLAX) suppository 10 mg (has no administration in time range)  ondansetron (ZOFRAN) tablet 4 mg (has no administration in time range)    Or  ondansetron (ZOFRAN) injection 4 mg (has no administration in time range)  heparin injection 5,000 Units (5,000 Units Subcutaneous Given 08/13/22 2141)  0.9 %  sodium chloride infusion ( Intravenous Infusion Verify 08/13/22 2301)  fluconazole (DIFLUCAN) tablet 100 mg (100 mg Oral Given 08/13/22 2004)  cefTRIAXone (ROCEPHIN) 1 g in sodium chloride 0.9 % 100 mL IVPB (has no administration in time range)  lactated ringers bolus 1,000 mL (0 mLs Intravenous Stopped 08/13/22 0657)  cefTRIAXone (ROCEPHIN) 2 g in sodium chloride 0.9 % 100 mL IVPB (0 g Intravenous Stopped 08/13/22 0756)  lactated ringers bolus 1,000 mL (0 mLs Intravenous Stopped 08/13/22 7858)    ED Course/ Medical Decision Making/ A&P                           Medical Decision Making Amount and/or Complexity of Data Reviewed Labs: ordered. Radiology: ordered.  Risk Decision regarding hospitalization.   With the dysuria and an increase in white blood cells and leuk esterase in his urine suspect likely urinary tract infection given that was negative for bacteria.  Urine culture sent Rocephin started.  Patient still not able to really ambulate much on his own I suspect is  somewhat related to hydration.  No acute kidney injury.  Fluids given as well.  Family feels unsafe taking him home so I discussed with hospitalist who will bring him in for observation and  hydration.  Final Clinical Impression(s) / ED Diagnoses Final diagnoses:  Weakness  Dehydration  Acute cystitis without hematuria    Rx / DC Orders ED Discharge Orders     None         Almee Pelphrey, Corene Cornea, MD 08/13/22 2337

## 2022-08-13 NOTE — ED Notes (Signed)
Reminded about urine sample and potential need to catheterize. Pt said he just voided before EMS arrived. Said that he would like to wait for IVF before he would accept cath sample.

## 2022-08-13 NOTE — ED Notes (Signed)
Carvedilol 20 mg 24 hr Medication not available at [facility] per pharmacy and was requested to be sent by [facility], medication will be rescheduled

## 2022-08-13 NOTE — H&P (Addendum)
Patient Demographics:    [name] [name], is a 88 y.o. male  MRN: [id number]   DOB - 1929/03/27  Admit Date - 08/13/2022  Outpatient Primary MD for the patient is Tonia Ghent, MD   Assessment & Plan:   Assessment and Plan: 1) generalized weakness---??  Query etiology -CT head is nonacute -No significant electrolyte derangements -COVID-negative -Chest x-ray without acute findings -EKG is sinus rhythm and nonacute and troponin x 2 not elevated -Cannot rule out UTI--await urine culture -Get PT eval   2) possible UTI-- UA with WBC leukocyte esterase protein but no bacteria WBC clumps and yeast -On CBC WBC is up to 12.6 IV Rocephin and p.o. Diflucan as ordered pending urine and blood culture data  3)BPH with LUTs--Flomax as ordered  4)GERD-Protonix as ordered  5)HTN--Coreg as ordered, hold lisinopril  6) CKD stage - 3B -creatinine is 1.55 which is close to prior baseline - renally adjust medications, avoid nephrotoxic agents / dehydration  / hypotension  7) chronic anemia--- hemoglobin currently at baseline -Suspect this is related to underlying CKD 3B  Dispo: The patient is from: Home              Anticipated d/c is to: Home with [facility]               Anticipated d/c date is: 1 day              Patient currently is not medically stable to d/c. Barriers: Not Clinically Stable-    With History of - Reviewed by me  Past Medical History:  Diagnosis Date   Acute renal failure ([place]) 12/06/2013   Back pain    occasionally   Benign essential tremor 2005   takes Primidone daily   Choledocholithiasis 12/06/2013   Constipation    takes OTC stool softener   Coronary artery disease 2007   s/p CABG, DR Johnsie Cancel   GERD (gastroesophageal reflux disease)    takes Nexium daily   Gout 1995   "~ once/yr"  (03/17/2014)   History of colon polyps    Hyperlipidemia 12/2005   takes Atorvastatin daily   Hypertension 1995   takes Lisinopril and Coreg daily   possible HCAP (healthcare-associated pneumonia) 12/09/2013   Prostate cancer ([place]) 2007   S/P treatment, followed by Urology prev   Right bundle branch block (RBBB)    Stenosis of cervical spine    Type II diabetes mellitus ([place])    no meds;diet and exercise controlled    Wears dentures    Wears glasses       Past Surgical History:  Procedure Laterality Date   Adenosine myoview  01/24/2006   Ischemia by EKG, Cath   CATARACT EXTRACTION W/ INTRAOCULAR LENS IMPLANT Left 10/2013   CHOLECYSTECTOMY N/A 12/06/2013   Procedure: Diagnostic Laparoscopy for  drainage Intrabdominal abcess;  Surgeon: Rolm Bookbinder, MD;  Location: [facility];  Service: General;  Laterality: N/A;   CHOLECYSTECTOMY N/A  03/17/2014   Procedure: LAPAROSCOPIC CHOLECYSTECTOMY ;  Surgeon: Rolm Bookbinder, MD;  Location: [facility];  Service: General;  Laterality: N/A;   COLONOSCOPY     CORONARY ARTERY BYPASS GRAFT  2007   CABGX 4   CYSTOSCOPY  02/15/2004   Mod BPH, moder tribec ?   ERCP N/A 12/06/2013   Procedure: ENDOSCOPIC RETROGRADE CHOLANGIOPANCREATOGRAPHY (ERCP);  Surgeon: Milus Banister, MD;  Location: [facility];  Service: Endoscopy;  Laterality: N/A;   LAPAROSCOPIC CHOLECYSTECTOMY  03/17/2014   LUMBAR FUSION  02/2012   lumbar spine for spinal stenosis   POSTERIOR CERVICAL FUSION/FORAMINOTOMY N/A 05/01/2020   Procedure: Posterior cervical fusion with lateral mass fixation - Cervical three - Cervical six, laminectomy Cervical three-six;  Surgeon: Eustace Moore, MD;  Location: [facility];  Service: Neurosurgery;  Laterality: N/A;   PROSTATE CRYOABLATION  09/18/2005   prostate CA      Chief Complaint  Patient presents with   Weakness      HPI:    [name] [name]  is a 88 y.o. male with past medical history relevant for CAD, chronic back pain, history of prostate cancer who  presents to the ED by EMS with generalized weakness and urinary frequency and urgency without frank dysuria - No fever  Or chills   No Nausea, Vomiting or Diarrhea -- Endorses back pain but he also has chronic back pain -In the ED UA with WBC leukocyte esterase protein but no bacteria WBC clumps and yeast -On CBC WBC is up to 12.6 hemoglobin is 12.1 which is close to baseline -Sodium is 134, glucose is 159 creatinine is 1.55 which is close to prior baseline -Chest x-ray and CT head without acute findings -EKG is sinus rhythm and nonacute and troponin x 2 not elevated -COVID-negative     Review of systems:    In addition to the HPI above,   A full Review of  Systems was done, all other systems reviewed are negative except as noted above in HPI , .    Social History:  Reviewed by me    Social History   Tobacco Use   Smoking status: Never   Smokeless tobacco: Never  Substance Use Topics   Alcohol use: No       Family History :  Reviewed by me    Family History  Problem Relation Age of Onset   Heart disease Mother    Cancer Mother    Cancer Sister    Diabetes Sister    Cancer Brother        prostate   Prostate cancer Brother    Hypothyroidism Brother    Depression Neg Hx    Alcohol abuse Neg Hx    Drug abuse Neg Hx    Stroke Neg Hx    Colon cancer Neg Hx      Home Medications:   Prior to Admission medications   Medication Sig Start Date End Date Taking? Authorizing Provider  acetaminophen (TYLENOL) 500 MG tablet Take 2 tablets (1,000 mg total) by mouth 3 (three) times daily as needed for mild pain or moderate pain. 06/06/20  Yes Tonia Ghent, MD  allopurinol (ZYLOPRIM) 300 MG tablet TAKE 1 TABLET DAILY 05/28/22  Yes Tonia Ghent, MD  aspirin 81 MG tablet Take 81 mg by mouth daily.   Yes [provider]  atorvastatin (LIPITOR) 40 MG tablet TAKE 1 TABLET DAILY 05/28/22  Yes Tonia Ghent, MD  carvedilol (COREG CR) 20 MG 24 hr capsule Take  1 capsule (20 mg total) by mouth daily. 07/02/21  Yes Tonia Ghent, MD  lisinopril (ZESTRIL) 20 MG tablet TAKE 1 TABLET DAILY 05/28/22  Yes Tonia Ghent, MD  melatonin 3 MG TABS tablet Take 1 tablet (3 mg total) by mouth at bedtime. 05/17/20  Yes Love, Ivan Anchors, PA-C  omeprazole (PRILOSEC) 40 MG capsule TAKE 1 CAPSULE DAILY AS NEEDED 01/14/22  Yes Tonia Ghent, MD  primidone (MYSOLINE) 50 MG tablet TAKE ONE-HALF (1/2) TABLET DAILY 05/28/22  Yes Tonia Ghent, MD  senna-docusate (SENOKOT-S) 8.6-50 MG tablet Take 1-2 tablets by mouth daily. 06/22/21  Yes Tonia Ghent, MD  tamsulosin [facility]) 0.4 MG CAPS capsule TAKE 1 CAPSULE DAILY 04/04/22  Yes Tonia Ghent, MD  traZODone (DESYREL) 50 MG tablet TAKE 1 TABLET AT BEDTIME 11/27/21  Yes Tonia Ghent, MD  vitamin B-12 (CYANOCOBALAMIN) 1000 MCG tablet Take 1 tablet (1,000 mcg total) by mouth daily. 01/20/20  Yes Tonia Ghent, MD  traMADol (ULTRAM) 50 MG tablet Take 1 tablet (50 mg total) by mouth every 12 (twelve) hours as needed. Patient not taking: Reported on 08/13/2022 07/02/21   Tonia Ghent, MD     Allergies:     Allergies  Allergen Reactions   Sulfonamide Derivatives     REACTION: lethargic     Physical Exam:   Vitals  Blood pressure (!) 157/78, pulse 62, temperature 97.6 F (36.4 C), temperature source Oral, resp. rate 18, height '5\' 11"'$  (1.803 m), weight 81.1 kg, SpO2 99 %.  Physical Examination: General appearance - alert,  in no distress  Mental status - alert, oriented to person, place, and time,  Eyes - sclera anicteric Neck - supple, no JVD elevation , Chest - clear  to auscultation bilaterally, symmetrical air movement,  Heart - S1 and S2 normal, regular  Abdomen - soft, nontender, nondistended, +BS Neurological - screening mental status exam normal, neck supple without rigidity, cranial nerves II through XII intact, DTR's normal and symmetric Extremities - no pedal edema noted, intact peripheral  pulses  Skin - warm, dry     Data Review:    CBC Recent Labs  Lab 08/13/22 0333  WBC 12.6*  HGB 12.1*  HCT 35.6*  PLT 179  MCV 98.1  MCH 33.3  MCHC 34.0  RDW 14.2  LYMPHSABS 1.2  MONOABS 1.1*  EOSABS 0.0  BASOSABS 0.0   ------------------------------------------------------------------------------------------------------------------  Chemistries  Recent Labs  Lab 08/13/22 0333  NA 134*  K 3.9  CL 103  CO2 22  GLUCOSE 159*  BUN 28*  CREATININE 1.55*  CALCIUM 9.0  MG 1.8  AST 15  ALT 13  ALKPHOS 65  BILITOT 1.2   ------------------------------------------------------------------------------------------------------------------ estimated creatinine clearance is 33.1 mL/min (A) (by C-G formula based on SCr of 1.55 mg/dL (H)). ------------------------------------------------------------------------------------------------------------------  ---------------------------------------------------------------------------------------------------------------  Urinalysis    Component Value Date/Time   COLORURINE YELLOW 08/13/2022 0305   APPEARANCEUR HAZY (A) 08/13/2022 0305   LABSPEC 1.011 08/13/2022 0305   PHURINE 6.0 08/13/2022 0305   GLUCOSEU NEGATIVE 08/13/2022 0305   HGBUR SMALL (A) 08/13/2022 0305   BILIRUBINUR NEGATIVE 08/13/2022 0305   BILIRUBINUR negative 10/10/2021 1453   KETONESUR NEGATIVE 08/13/2022 0305   PROTEINUR >=300 (A) 08/13/2022 0305   UROBILINOGEN 0.2 10/10/2021 1453   UROBILINOGEN 0.2 01/07/2014 1044   NITRITE NEGATIVE 08/13/2022 0305   LEUKOCYTESUR MODERATE (A) 08/13/2022 0305    ----------------------------------------------------------------------------------------------------------------   Imaging Results:    DG Chest Portable 1 View  Result Date:  08/13/2022 CLINICAL DATA:  Generalized weakness. EXAM: PORTABLE CHEST 1 VIEW COMPARISON:  05/05/2020. FINDINGS: The heart size and mediastinal contours are stable. There is  atherosclerotic calcification of the aorta. No consolidation, effusion, or pneumothorax. Sternotomy wires are present over the midline. IMPRESSION: No active disease. Electronically Signed   By: Brett Fairy M.D.   On: 08/13/2022 03:24   CT Head Wo Contrast  Result Date: 08/13/2022 CLINICAL DATA:  Mental status change, unknown cause. EXAM: CT HEAD WITHOUT CONTRAST TECHNIQUE: Contiguous axial images were obtained from the base of the skull through the vertex without intravenous contrast. RADIATION DOSE REDUCTION: This exam was performed according to the departmental dose-optimization program which includes automated exposure control, adjustment of the mA and/or kV according to patient size and/or use of iterative reconstruction technique. COMPARISON:  03/10/2020. FINDINGS: Brain: No acute intracranial hemorrhage, midline shift or mass effect. No extra-axial fluid collection. Diffuse atrophy is noted. Extensive periventricular white matter hypodensities are seen bilaterally. No hydrocephalus. Vascular: Atherosclerotic calcification of the carotid siphons and vertebral arteries. No hyperdense vessel. Skull: Normal. Negative for fracture or focal lesion. Sinuses/Orbits: Mild mucosal thickening in the ethmoid air cells, sphenoid sinus, and maxillary sinuses. No acute orbital abnormality. Other: None. IMPRESSION: 1. No acute intracranial process. 2. Atrophy with chronic microvascular ischemic changes. 3. Chronic paranasal sinus disease. Electronically Signed   By: Brett Fairy M.D.   On: 08/13/2022 03:23    Radiological Exams on Admission: DG Chest Portable 1 View  Result Date: 08/13/2022 CLINICAL DATA:  Generalized weakness. EXAM: PORTABLE CHEST 1 VIEW COMPARISON:  05/05/2020. FINDINGS: The heart size and mediastinal contours are stable. There is atherosclerotic calcification of the aorta. No consolidation, effusion, or pneumothorax. Sternotomy wires are present over the midline. IMPRESSION: No active disease.  Electronically Signed   By: Brett Fairy M.D.   On: 08/13/2022 03:24   CT Head Wo Contrast  Result Date: 08/13/2022 CLINICAL DATA:  Mental status change, unknown cause. EXAM: CT HEAD WITHOUT CONTRAST TECHNIQUE: Contiguous axial images were obtained from the base of the skull through the vertex without intravenous contrast. RADIATION DOSE REDUCTION: This exam was performed according to the departmental dose-optimization program which includes automated exposure control, adjustment of the mA and/or kV according to patient size and/or use of iterative reconstruction technique. COMPARISON:  03/10/2020. FINDINGS: Brain: No acute intracranial hemorrhage, midline shift or mass effect. No extra-axial fluid collection. Diffuse atrophy is noted. Extensive periventricular white matter hypodensities are seen bilaterally. No hydrocephalus. Vascular: Atherosclerotic calcification of the carotid siphons and vertebral arteries. No hyperdense vessel. Skull: Normal. Negative for fracture or focal lesion. Sinuses/Orbits: Mild mucosal thickening in the ethmoid air cells, sphenoid sinus, and maxillary sinuses. No acute orbital abnormality. Other: None. IMPRESSION: 1. No acute intracranial process. 2. Atrophy with chronic microvascular ischemic changes. 3. Chronic paranasal sinus disease. Electronically Signed   By: Brett Fairy M.D.   On: 08/13/2022 03:23    DVT Prophylaxis -SCD AM Labs Ordered, also please review Full Orders  Family Communication: Admission, patients condition and plan of care including tests being ordered have been discussed with the patient  who indicate understanding and agree with the plan   Condition  -stable  Roxan Hockey M.D on 08/13/2022 at 7:06 PM Go to [email or web address] -  for contact info  [facility] - Office  [phone]

## 2022-08-13 NOTE — ED Notes (Signed)
Denton Brick, MD at bedside

## 2022-08-13 NOTE — ED Triage Notes (Signed)
Pt BIB [facility] from home. Per pts son pt has been getting weak over the last few days and he had to help pt to ground tonight as he was too weak to walk. Pt states he has been having trouble urinating.   Skin tear noted to left elbow

## 2022-08-14 ENCOUNTER — Telehealth: Payer: Self-pay | Admitting: Family Medicine

## 2022-08-14 DIAGNOSIS — R338 Other retention of urine: Secondary | ICD-10-CM

## 2022-08-14 DIAGNOSIS — R531 Weakness: Secondary | ICD-10-CM | POA: Diagnosis not present

## 2022-08-14 LAB — CBC
HCT: 29.7 % — ABNORMAL LOW (ref 39.0–52.0)
Hemoglobin: 10 g/dL — ABNORMAL LOW (ref 13.0–17.0)
MCH: 32.9 pg (ref 26.0–34.0)
MCHC: 33.7 g/dL (ref 30.0–36.0)
MCV: 97.7 fL (ref 80.0–100.0)
Platelets: 159 10*3/uL (ref 150–400)
RBC: 3.04 MIL/uL — ABNORMAL LOW (ref 4.22–5.81)
RDW: 14.4 % (ref 11.5–15.5)
WBC: 10.2 10*3/uL (ref 4.0–10.5)
nRBC: 0 % (ref 0.0–0.2)

## 2022-08-14 LAB — BASIC METABOLIC PANEL
Anion gap: 5 (ref 5–15)
BUN: 22 mg/dL (ref 8–23)
CO2: 23 mmol/L (ref 22–32)
Calcium: 8.2 mg/dL — ABNORMAL LOW (ref 8.9–10.3)
Chloride: 106 mmol/L (ref 98–111)
Creatinine, Ser: 1.51 mg/dL — ABNORMAL HIGH (ref 0.61–1.24)
GFR, Estimated: 43 mL/min — ABNORMAL LOW (ref >60)
Glucose, Bld: 95 mg/dL (ref 70–99)
Potassium: 3.8 mmol/L (ref 3.5–5.1)
Sodium: 134 mmol/L — ABNORMAL LOW (ref 135–145)

## 2022-08-14 MED ORDER — TAMSULOSIN HCL 0.4 MG PO CAPS
0.8000 mg | ORAL_CAPSULE | Freq: Every day | ORAL | Status: DC
  Administered 2022-08-15 – 2022-08-16 (×2): 0.8 mg via ORAL
  Filled 2022-08-14 (×2): qty 2

## 2022-08-14 MED ORDER — POLYETHYLENE GLYCOL 3350 17 G PO PACK
17.0000 g | PACK | Freq: Two times a day (BID) | ORAL | Status: DC
  Administered 2022-08-14 – 2022-08-15 (×3): 17 g via ORAL
  Filled 2022-08-14 (×3): qty 1

## 2022-08-14 MED ORDER — MINERAL OIL RE ENEM
1.0000 | ENEMA | Freq: Once | RECTAL | Status: AC
  Administered 2022-08-14: 1 via RECTAL

## 2022-08-14 MED ORDER — SENNOSIDES-DOCUSATE SODIUM 8.6-50 MG PO TABS
1.0000 | ORAL_TABLET | Freq: Two times a day (BID) | ORAL | Status: DC
  Administered 2022-08-14 – 2022-08-16 (×4): 1 via ORAL
  Filled 2022-08-14 (×4): qty 1

## 2022-08-14 NOTE — TOC Initial Note (Signed)
Transition of Care [facility]) - Initial/Assessment Note    Patient Details  Name: [name] [name] MRN: [id number] Date of Birth: Apr 05, 1929  [facility] CM/SW Contact:    Iona Beard, [place] Phone Number: 08/14/2022, 1:49 PM  Clinical Narrative:                 CSW updated that PT is recommending [facility] for pt with note that pt is interested in [facility] through the [place]. CSW spoke with pts daughter who was in room with pt. [facility] not [facility] with a [facility]. CSW explained that they can reach out to the [place] and inquire about getting pt set up. CSW explained [facility] services, they are interested in these and do not have a company preference. CSW gave referral to [facility] with [facility] who states they are accepting pt for [facility]. Pt will need HH PT orders prior to D/C. TOC to follow.   Expected Discharge Plan: [place] Barriers to Discharge: Continued Medical Work up   Patient Goals and CMS Choice Patient states their goals for this hospitalization and ongoing recovery are:: home with [facility] CMS Medicare.gov Compare Post Acute Care list provided to:: Patient Choice offered to / list presented to : Patient, Adult Children  Expected Discharge Plan and Services Expected Discharge Plan: [place] In-house Referral: Clinical Social Work Discharge Planning Services: CM Consult Post Acute Care Choice: [place] arrangements for the past 2 months: [place]: PT [place]: [place] ([facility]) Date [place]: 08/14/22   Representative spoke with at [place]: Vaughan Basta  Prior Living Arrangements/Services Living arrangements for the past 2 months: [place] Lives with:: Self Patient language and need for interpreter reviewed:: Yes Do you feel safe going back to the place where you live?: Yes      Need for Family Participation in Patient Care: Yes (Comment) Care giver  support system in place?: Yes (comment) Current home services: DME Criminal Activity/Legal Involvement Pertinent to Current Situation/Hospitalization: No - Comment as needed  Activities of Daily Living Home Assistive Devices/Equipment: Gilford Rile (specify type) ADL Screening (condition at time of admission) Patient's cognitive ability adequate to safely complete daily activities?: Yes Is the patient deaf or have difficulty hearing?: Yes Does the patient have difficulty seeing, even when wearing glasses/contacts?: No Does the patient have difficulty concentrating, remembering, or making decisions?: No Patient able to express need for assistance with ADLs?: Yes Does the patient have difficulty dressing or bathing?: No Independently performs ADLs?: Yes (appropriate for developmental age) Does the patient have difficulty walking or climbing stairs?: Yes Weakness of Legs: Both Weakness of Arms/Hands: None  Permission Sought/Granted                  Emotional Assessment Appearance:: Appears stated age Attitude/Demeanor/Rapport: Engaged Affect (typically observed): Accepting Orientation: : Oriented to Self, Oriented to Place, Oriented to  Time, Oriented to Situation Alcohol / Substance Use: Not Applicable Psych Involvement: No (comment)  Admission diagnosis:  Dehydration [E86.0] UTI (urinary tract infection) [N39.0] Weakness [R53.1] Acute cystitis without hematuria [N30.00] Patient Active Problem List   Diagnosis Date Noted   UTI (urinary tract infection) 08/13/2022   Urinary frequency 10/10/2021   History of prostate cancer 10/10/2021   Incomplete bladder emptying  10/10/2021   Cystitis 10/10/2021   DNR (do not resuscitate) 06/25/2021   Constipation 07/10/2020   Sleep disturbance 07/10/2020   Abnormality of gait 07/10/2020   Insomnia 06/12/2020   AKI (acute kidney injury) ([place])    Steroid-induced hyperglycemia    Hyponatremia    Hypoalbuminemia due to protein-calorie  malnutrition (HCC)    Transaminitis    Leucocytosis    Acute on chronic anemia    Stenosis of cervical spine with myelopathy ([place]) 05/04/2020   S/P cervical spinal fusion 05/01/2020   History of agent Orange exposure 04/05/2020   Paresthesia 01/20/2020   Health care maintenance 12/28/2017   GERD (gastroesophageal reflux disease) 12/28/2017   Medicare annual wellness visit, subsequent 10/30/2014   Advance care planning 10/30/2014   Right bundle branch block (RBBB)    Wheezing 12/09/2013   CKD (chronic kidney disease), stage III ([place]) 12/06/2013   Back pain 10/20/2011   Hypothyroidism 03/23/2009   UNSPECIFIED VITAMIN D DEFICIENCY 03/23/2009   Tremor 12/18/2006   Coronary atherosclerosis 12/18/2006   ADENOCARCINOMA, PROSTATE, HX OF 12/18/2006   HLD (hyperlipidemia) 12/08/2005   History of diabetes mellitus 02/07/2005   Gout 09/09/1993   Essential hypertension 09/09/1993   PCP:  Tonia Ghent, MD Pharmacy:   [facility] [place] - [address], [address], [place] [address] Phone: [phone] Fax: [place], [place] AT [place]. [place] [address] Phone: [phone] Fax: [phone]  [facility] [place], [place] [address] [place] [address] Phone: [phone] Fax: [phone]     Social Determinants of Health (SDOH) Interventions    Readmission Risk Interventions     No data to display

## 2022-08-14 NOTE — Telephone Encounter (Signed)
Patient is overdue for medicare wellness as of oct. Please call to schedule patient.

## 2022-08-14 NOTE — Evaluation (Addendum)
Physical Therapy Evaluation Patient Details Name: [name] [name] MRN: [id number] DOB: [birth year] Today's Date: 08/14/2022  History of Present Illness  [name] [name]  is a 88 y.o. male with past medical history relevant for CAD, chronic back pain, history of prostate cancer who presents to the ED by EMS with generalized weakness and urinary frequency and urgency without frank dysuria  Clinical Impression   Pt is a 88yo male presenting to [facility] due to reason stated in the HPI.   Pt tolerated today's Physical Therapy Evaluation and was eager to mobilize as pt reports being very independent and enjoys activity. Pt performed modified independent w/ bed mobility, modified independent w/ RW w/ functional transfers, and supervision w/ RW for IV only for 197f for gait/ambulation.   Pt is near functional baseline with mobility, mild balance deficits are noted which believed to be chronic in nature and appropriate with aging. There are no acute physical therapy services indicated at this time. Pt is safe and appropriate to mobilize with Nursing staff as schedule permits. PT to sign off.             Recommendations for follow up therapy are one component of a multi-disciplinary discharge planning process, led by the attending physician.  Recommendations may be updated based on patient status, additional functional criteria and insurance authorization.  Follow Up Recommendations Other (comment) (Pt [place] [facility] [place] [facility] Noted some chronic balance deficits and could benefit from some Skilled PT services, no acute PT services are found upon these evaluation.)      Assistance Recommended at Discharge PRN  Patient can return home with the following  Help with stairs or ramp for entrance          Functional Status Assessment Patient has not had a recent decline in their functional status     Precautions / Restrictions  Precautions Precautions: None Restrictions Weight Bearing Restrictions: No      Mobility  Bed Mobility Overal bed mobility: Modified Independent (Pt reports sleeping in elevated bed positioning at home, simulating elevated HOB during bed mobility assessment.)       General bed mobility comments: Use of bedrail and HOB elevated to power to sitting EOB. No cues provided. Patient Response: Cooperative  Transfers Overall transfer level: Modified independent Equipment used: Rolling walker (2 wheels)       General transfer comment: Powerup from EOB to standing with use of BUE on RW, no verbal cues given. Limited eccentric control for stand<>sit to recliner, verbal cues given for hand placement.    Ambulation/Gait Ambulation/Gait assistance: Supervision Gait Distance (Feet): 150 Feet Assistive device: Rolling walker (2 wheels) Gait Pattern/deviations: Step-through pattern, Decreased step length - right, Decreased step length - left, Shuffle, Trunk flexed, WFL(Within Functional Limits)       General Gait Details: Pt ambulating at supervision level for IV pole management. Steady and appropriate gait speed with appropriate postural and anticipatory reactions when directed for turning/pivoting.  Stairs Stairs:  (Pt reports no concerns with stair negotiation at home.)             Balance Overall balance assessment: Needs assistance Sitting-balance support: No upper extremity supported Sitting balance-Leahy Scale: Good Sitting balance - Comments: good sitting balance at EOB no use of UE support, flat foot contact in sitting. Postural control: Posterior lean (Slight posterior lean with B shoulder flexion and resistance applied, self corrected.) Standing balance support: Bilateral upper extremity supported Standing balance-Leahy Scale: Good Standing balance  comment: Good standing balance noted with BUE on RW. Appropriate reactions in dynamic activities with increased pivoting and  turning.           Pertinent Vitals/Pain Pain Assessment Pain Assessment: No/denies pain    Home Living Family/patient expects to be discharged to:: Private residence Living Arrangements: Children (Pt reports DIL is at home most of the time and can assist as needed.) Available Help at Discharge: Family Type of Home: House Home Access: Stairs to enter Entrance Stairs-Rails: None Entrance Stairs-Number of Steps: 3   Home Layout: One level Home Equipment: Conservation officer, nature (2 wheels);Cane - quad;Grab bars - toilet;Shower seat      Prior Function Prior Level of Function : Independent/Modified Independent     Mobility Comments: Pt reports decline in function but ambulating about home and community with RW now. ADLs Comments: No concerns noted as pt reports being very independent.     Extremity/Trunk Assessment   Upper Extremity Assessment Upper Extremity Assessment: Overall WFL for tasks assessed    Lower Extremity Assessment Lower Extremity Assessment: Overall WFL for tasks assessed    Cervical / Trunk Assessment Cervical / Trunk Assessment: Normal  Communication   Communication: No difficulties  Cognition Arousal/Alertness: Awake/alert Behavior During Therapy: WFL for tasks assessed/performed Overall Cognitive Status: Within Functional Limits for tasks assessed         General Comments: Intermittment forgetfulness however [facility]               Assessment/Plan    PT Assessment Patient does not need any further PT services (Pt is safe to mobilize with Nursing staff as able.)  PT Problem List         PT Treatment Interventions      PT Goals (Current goals can be found in the Care Plan section)  Acute Rehab PT Goals Patient Stated Goal: "go home with Home health PT services." PT Goal Formulation: With patient Time For Goal Achievement: 08/16/22 Potential to Achieve Goals: Good     AM-PAC PT "6 Clicks" Mobility  Outcome Measure Help needed turning from  your back to your side while in a flat bed without using bedrails?: None Help needed moving from lying on your back to sitting on the side of a flat bed without using bedrails?: None Help needed moving to and from a bed to a chair (including a wheelchair)?: None Help needed standing up from a chair using your arms (e.g., wheelchair or bedside chair)?: None Help needed to walk in hospital room?: None Help needed climbing 3-5 steps with a railing? : A Little 6 Click Score: 23    End of Session Equipment Utilized During Treatment: Gait belt Activity Tolerance: Patient tolerated treatment well Patient left: in chair;Other (comment);with call bell/phone within reach (RN notified of positioning,) Nurse Communication: Mobility status PT Visit Diagnosis: Unsteadiness on feet (R26.81);Other abnormalities of gait and mobility (R26.89);Muscle weakness (generalized) (M62.81)    Time: 4270-6237 PT Time Calculation (min) (ACUTE ONLY): 23 min   Charges:   PT Evaluation $PT Eval Low Complexity: 1 Low PT Treatments $Gait Training: 8-22 mins $Therapeutic Activity: 23-37 mins        Wonda Olds PT, DPT Physical Therapist with [facility] office  Wonda Olds 08/14/2022, 9:41 AM

## 2022-08-14 NOTE — Telephone Encounter (Signed)
LVM for patient tcb and schedule 

## 2022-08-14 NOTE — Care Management Obs Status (Signed)
[place] NOTIFICATION   Patient Details  Name: [name] [name] MRN: [id number] Date of Birth: 06/22/1929   Medicare Observation Status Notification Given:  Yes    Tommy Medal 08/14/2022, 4:28 PM

## 2022-08-14 NOTE — Progress Notes (Signed)
PROGRESS NOTE    MICHAEL WALRATH  HEN:[id number] DOB: 17-Aug-1929 DOA: 08/13/2022 PCP: Tonia Ghent, MD     Brief Narrative:   [name] [name]  is a 88 y.o. male with past medical history relevant for CAD, chronic back pain, history of prostate cancer who presents to the ED by EMS with generalized weakness and urinary frequency and urgency without frank dysuria, reports difficulty emptying     Subjective:  Still feel burning sensation when urinate, reports not able to empty bladder Still weak, could hardly walk No bm for 8 days, tried stool softener at home but did not work   Assessment & Plan:  Active Problems:   UTI (urinary tract infection)   Essential hypertension     UTI/Acute urinary retention -urine culture in process, continue current abx, insert foley, increase flomax   Constipation Reported no BM for 8 days despite trying stool softener at home He agreed to enema  HTN--Coreg as ordered, hold lisinopril   CKD stage - 3B -creatinine is 1.55 which is close to prior baseline - renally adjust medications, avoid nephrotoxic agents / dehydration  / hypotension   chronic anemia--- hemoglobin currently at baseline -Suspect this is related to underlying CKD 3B  FTT/ weakness Possible due to UTI, PT eval  I have Reviewed nursing notes, Vitals, pain scores, I/o's, Lab results and  imaging results since pt's last encounter, details please see discussion above  I ordered the following labs:  Unresulted Labs (From admission, onward)     Start     Ordered   08/15/22 3614  Basic metabolic panel  Tomorrow morning,   R       Question:  Specimen collection method  Answer:  Lab=Lab collect   08/14/22 1421   08/15/22 0500  Magnesium  Tomorrow morning,   R       Question:  Specimen collection method  Answer:  Lab=Lab collect   08/14/22 1421   08/15/22 0500  CBC  Tomorrow morning,   R       Question:  Specimen collection method  Answer:  Lab=Lab collect   08/14/22  1859   08/13/22 0624  Blood culture (routine x 2)  BLOOD CULTURE X 2,   R (with STAT occurrences)      08/13/22 0623             DVT prophylaxis: heparin injection 5,000 Units Start: 08/13/22 0815 SCDs Start: 08/13/22 0812 Place TED hose Start: 08/13/22 0812   Code Status:   Code Status: Full Code  Family Communication: none at bedside  Disposition:   Status is: Observation    Dispo: The patient is from: home              Anticipated d/c is to: home with home health              Anticipated d/c date is: awaiting for urine culure, will need voiding trial prior to d/c, may need to be discharged on foley   Antimicrobials:    Anti-infectives (From admission, onward)    Start     Dose/Rate Route Frequency Ordered Stop   08/14/22 0800  cefTRIAXone (ROCEPHIN) 1 g in sodium chloride 0.9 % 100 mL IVPB        1 g 200 mL/hr over 30 Minutes Intravenous Every 24 hours 08/13/22 1913     08/13/22 2000  fluconazole (DIFLUCAN) tablet 100 mg        100 mg Oral Daily 08/13/22 1909 08/17/22 0959  08/13/22 0630  cefTRIAXone (ROCEPHIN) 2 g in sodium chloride 0.9 % 100 mL IVPB        2 g 200 mL/hr over 30 Minutes Intravenous  Once 08/13/22 0623 08/13/22 0756          Objective: Vitals:   08/13/22 1630 08/13/22 2225 08/14/22 0027 08/14/22 0530  BP: (!) 157/78 (!) 147/71 116/60 (!) 141/67  Pulse: 62 68 67 62  Resp: '18 20 18 18  '$ Temp: 97.6 F (36.4 C) 98.2 F (36.8 C) 98.5 F (36.9 C) 98.1 F (36.7 C)  TempSrc: Oral     SpO2: 99% 97% 97% 97%  Weight: 81.1 kg     Height:        Intake/Output Summary (Last 24 hours) at 08/14/2022 1859 Last data filed at 08/14/2022 1500 Gross per 24 hour  Intake 961.54 ml  Output 400 ml  Net 561.54 ml   Filed Weights   08/13/22 0242 08/13/22 1630  Weight: 80 kg 81.1 kg    Examination:  General exam: alert, awake, communicative,calm, NAD Respiratory system: Clear to auscultation. Respiratory effort normal. Cardiovascular system:   RRR.  Gastrointestinal system: Abdomen is nondistended, soft and nontender.  Normal bowel sounds heard. Central nervous system: Alert and oriented. No focal neurological deficits. Extremities:  no edema Skin: No rashes, lesions or ulcers Psychiatry: Judgement and insight appear normal. Mood & affect appropriate.     Data Reviewed: I have personally reviewed  labs and visualized  imaging studies since the last encounter and formulate the plan        Scheduled Meds:  allopurinol  300 mg Oral Daily   aspirin EC  81 mg Oral Daily   atorvastatin  40 mg Oral Daily   carvedilol  20 mg Oral Daily   cyanocobalamin  1,000 mcg Oral Daily   fluconazole  100 mg Oral Daily   heparin  5,000 Units Subcutaneous Q8H   melatonin  3 mg Oral QHS   pantoprazole  40 mg Oral Daily   polyethylene glycol  17 g Oral BID   primidone  25 mg Oral Daily   senna-docusate  1 tablet Oral BID   sodium chloride flush  3 mL Intravenous Q12H   sodium chloride flush  3 mL Intravenous Q12H   [START ON 08/15/2022] tamsulosin  0.8 mg Oral Daily   traZODone  50 mg Oral QHS   Continuous Infusions:  sodium chloride     cefTRIAXone (ROCEPHIN)  IV 1 g (08/14/22 0940)     LOS: 0 days     Florencia Reasons, MD PhD FACP [facility]  Available via Epic secure chat 7am-7pm for nonurgent issues Please page for urgent issues To page the attending provider between 7A-7P or the covering provider during after hours 7P-7A, please log into the web site [email or web address] and access using universal [place] password for that web site. If you do not have the password, please call the hospital operator.    08/14/2022, 6:59 PM

## 2022-08-15 DIAGNOSIS — R531 Weakness: Secondary | ICD-10-CM | POA: Diagnosis not present

## 2022-08-15 DIAGNOSIS — R338 Other retention of urine: Secondary | ICD-10-CM | POA: Diagnosis not present

## 2022-08-15 LAB — MAGNESIUM: Magnesium: 1.8 mg/dL (ref 1.7–2.4)

## 2022-08-15 LAB — BASIC METABOLIC PANEL
Anion gap: 5 (ref 5–15)
BUN: 19 mg/dL (ref 8–23)
CO2: 24 mmol/L (ref 22–32)
Calcium: 8.4 mg/dL — ABNORMAL LOW (ref 8.9–10.3)
Chloride: 106 mmol/L (ref 98–111)
Creatinine, Ser: 1.48 mg/dL — ABNORMAL HIGH (ref 0.61–1.24)
GFR, Estimated: 44 mL/min — ABNORMAL LOW (ref >60)
Glucose, Bld: 102 mg/dL — ABNORMAL HIGH (ref 70–99)
Potassium: 3.9 mmol/L (ref 3.5–5.1)
Sodium: 135 mmol/L (ref 135–145)

## 2022-08-15 LAB — CBC
HCT: 29.8 % — ABNORMAL LOW (ref 39.0–52.0)
Hemoglobin: 10.2 g/dL — ABNORMAL LOW (ref 13.0–17.0)
MCH: 33.3 pg (ref 26.0–34.0)
MCHC: 34.2 g/dL (ref 30.0–36.0)
MCV: 97.4 fL (ref 80.0–100.0)
Platelets: 168 10*3/uL (ref 150–400)
RBC: 3.06 MIL/uL — ABNORMAL LOW (ref 4.22–5.81)
RDW: 14.4 % (ref 11.5–15.5)
WBC: 8.1 10*3/uL (ref 4.0–10.5)
nRBC: 0 % (ref 0.0–0.2)

## 2022-08-15 LAB — URINE CULTURE: Culture: >=100000 — AB

## 2022-08-15 MED ORDER — AMOXICILLIN 250 MG PO CAPS
500.0000 mg | ORAL_CAPSULE | Freq: Two times a day (BID) | ORAL | Status: DC
  Administered 2022-08-15: 500 mg via ORAL
  Filled 2022-08-15: qty 2

## 2022-08-15 MED ORDER — SORBITOL 70 % SOLN
300.0000 mL | TOPICAL_OIL | Freq: Once | ORAL | Status: AC
  Administered 2022-08-15: 300 mL via RECTAL
  Filled 2022-08-15: qty 90

## 2022-08-15 MED ORDER — POLYETHYLENE GLYCOL 3350 17 G PO PACK
17.0000 g | PACK | Freq: Two times a day (BID) | ORAL | Status: DC
  Administered 2022-08-15 – 2022-08-16 (×2): 17 g via ORAL
  Filled 2022-08-15 (×2): qty 1

## 2022-08-15 MED ORDER — LACTATED RINGERS IV SOLN: INTRAVENOUS | Status: DC

## 2022-08-15 MED ORDER — CEFADROXIL 500 MG PO CAPS
500.0000 mg | ORAL_CAPSULE | Freq: Two times a day (BID) | ORAL | Status: DC
  Administered 2022-08-15 – 2022-08-16 (×2): 500 mg via ORAL
  Filled 2022-08-15 (×9): qty 1

## 2022-08-15 MED ORDER — SODIUM CHLORIDE 0.9 % IV SOLN: INTRAVENOUS | Status: AC

## 2022-08-15 MED ORDER — MAGNESIUM SULFATE 2 GM/50ML IV SOLN
2.0000 g | Freq: Once | INTRAVENOUS | Status: AC
  Administered 2022-08-15: 2 g via INTRAVENOUS
  Filled 2022-08-15: qty 50

## 2022-08-15 MED ORDER — CHLORHEXIDINE GLUCONATE CLOTH 2 % EX PADS
6.0000 | MEDICATED_PAD | Freq: Every day | CUTANEOUS | Status: DC
  Administered 2022-08-15 – 2022-08-16 (×2): 6 via TOPICAL

## 2022-08-15 NOTE — Progress Notes (Addendum)
PROGRESS NOTE    [name] [name]  UXN:[id number] DOB: April 25, 1929 DOA: 08/13/2022 PCP: Tonia Ghent, MD     Brief Narrative:   [name] [name]  is a 88 y.o. male with past medical history relevant for CAD, chronic back pain, history of prostate cancer who presents to the ED by EMS with generalized weakness and urinary frequency and urgency without frank dysuria, reports difficulty emptying     Subjective:   Still weak, could hardly walk, still no bm after enema No appetite, appear dehydrated,  He does not think he can go home today, he states "I am at most 20% better",  Daughter at bedside     Assessment & Plan:  Active Problems:   UTI (urinary tract infection)   Essential hypertension     Staph epididymis UTI/Acute urinary retention -blood culture no growth -staph resistent  to tetracycline, was on rocephin, changed to oral abx -continue foley, increased flomax -continue hydration, poor appetite, weakness   Constipation Reported no BM for 8 days despite trying stool softener at home No result with enema yesterday, will try smog today  HTN--Coreg as ordered, hold lisinopril   CKD stage - 3B -creatinine is 1.55 which is close to prior baseline - renally adjust medications, avoid nephrotoxic agents / dehydration  / hypotension   chronic anemia--- hemoglobin currently at baseline -Suspect this is related to underlying CKD 3B  FTT/ weakness Usually ambulates without assistance at baseline, but, started to use walker and now could barely walk. Possible due to UTI, PT eval recommended HH , order placed   I have Reviewed nursing notes, Vitals, pain scores, I/o's, Lab results and  imaging results since pt's last encounter, details please see discussion above  I ordered the following labs:  Unresulted Labs (From admission, onward)     Start     Ordered   08/16/22 0500  CBC with Differential/Platelet  Tomorrow morning,   R       Question:  Specimen collection  method  Answer:  Lab=Lab collect   08/15/22 1104   08/16/22 2542  Basic metabolic panel  Tomorrow morning,   R       Question:  Specimen collection method  Answer:  Lab=Lab collect   08/15/22 1104   08/16/22 0500  Magnesium  Tomorrow morning,   R       Question:  Specimen collection method  Answer:  Lab=Lab collect   08/15/22 1104   08/13/22 0624  Blood culture (routine x 2)  BLOOD CULTURE X 2,   R (with STAT occurrences)      08/13/22 0623             DVT prophylaxis: heparin injection 5,000 Units Start: 08/13/22 0815 SCDs Start: 08/13/22 0812 Place TED hose Start: 08/13/22 0812   Code Status:   Code Status: Full Code  Family Communication: daughter at bedside  Disposition:   Status is: Observation    Dispo: The patient is from: home              Anticipated d/c is to: home with home health, order placed               Anticipated d/c date is: need to have bm, will need voiding trial prior to d/c, may need to be discharged on foley   Antimicrobials:    Anti-infectives (From admission, onward)    Start     Dose/Rate Route Frequency Ordered Stop   08/15/22 1200  cefadroxil (DURICEF) capsule  500 mg        500 mg Oral 2 times daily 08/15/22 1051     08/15/22 1045  amoxicillin (AMOXIL) capsule 500 mg  Status:  Discontinued        500 mg Oral Every 12 hours 08/15/22 0952 08/15/22 1051   08/14/22 0800  cefTRIAXone (ROCEPHIN) 1 g in sodium chloride 0.9 % 100 mL IVPB  Status:  Discontinued        1 g 200 mL/hr over 30 Minutes Intravenous Every 24 hours 08/13/22 1913 08/15/22 0952   08/13/22 2000  fluconazole (DIFLUCAN) tablet 100 mg        100 mg Oral Daily 08/13/22 1909 08/17/22 0959   08/13/22 0630  cefTRIAXone (ROCEPHIN) 2 g in sodium chloride 0.9 % 100 mL IVPB        2 g 200 mL/hr over 30 Minutes Intravenous  Once 08/13/22 0623 08/13/22 0756          Objective: Vitals:   08/14/22 0530 08/14/22 1900 08/14/22 2156 08/15/22 0345  BP: (!) 141/67 (!) 144/65 (!)  157/71 (!) 146/72  Pulse: 62 64 60 63  Resp: '18 16 18 20  '$ Temp: 98.1 F (36.7 C) 98.4 F (36.9 C) 98.5 F (36.9 C) 99.4 F (37.4 C)  TempSrc:      SpO2: 97% 99% 97% 97%  Weight:      Height:        Intake/Output Summary (Last 24 hours) at 08/15/2022 1739 Last data filed at 08/15/2022 1600 Gross per 24 hour  Intake 531.34 ml  Output 1500 ml  Net -968.66 ml   Filed Weights   08/13/22 0242 08/13/22 1630  Weight: 80 kg 81.1 kg    Examination:  General exam: alert, awake, communicative,calm, NAD Respiratory system: Clear to auscultation. Respiratory effort normal. Cardiovascular system:  RRR.  Gastrointestinal system: Abdomen is nondistended, soft and nontender.  Normal bowel sounds heard. Central nervous system: Alert and oriented. No focal neurological deficits. Extremities:  no edema Skin: No rashes, lesions or ulcers Psychiatry: Judgement and insight appear normal. Mood & affect appropriate.     Data Reviewed: I have personally reviewed  labs and visualized  imaging studies since the last encounter and formulate the plan        Scheduled Meds:  allopurinol  300 mg Oral Daily   aspirin EC  81 mg Oral Daily   atorvastatin  40 mg Oral Daily   carvedilol  20 mg Oral Daily   cefadroxil  500 mg Oral BID   Chlorhexidine Gluconate Cloth  6 each Topical Daily   cyanocobalamin  1,000 mcg Oral Daily   fluconazole  100 mg Oral Daily   heparin  5,000 Units Subcutaneous Q8H   melatonin  3 mg Oral QHS   pantoprazole  40 mg Oral Daily   polyethylene glycol  17 g Oral BID   primidone  25 mg Oral Daily   senna-docusate  1 tablet Oral BID   sodium chloride flush  3 mL Intravenous Q12H   sodium chloride flush  3 mL Intravenous Q12H   tamsulosin  0.8 mg Oral Daily   traZODone  50 mg Oral QHS   Continuous Infusions:  sodium chloride     sodium chloride 75 mL/hr at 08/15/22 1245     LOS: 0 days     Florencia Reasons, MD PhD FACP [facility]  Available via Epic secure  chat 7am-7pm for nonurgent issues Please page for urgent issues To page the attending provider  between 7A-7P or the covering provider during after hours 7P-7A, please log into the web site [email or web address] and access using universal [place] password for that web site. If you do not have the password, please call the hospital operator.    08/15/2022, 5:39 PM

## 2022-08-15 NOTE — Telephone Encounter (Signed)
Called pt, spoke to pt's daughter, [name]. [name] stated the pt was in the [facility] at the moment & she'd return our call at a later time.

## 2022-08-15 NOTE — Telephone Encounter (Signed)
Appt was scheduled for 09/13/2022

## 2022-08-16 ENCOUNTER — Other Ambulatory Visit (HOSPITAL_COMMUNITY): Payer: Self-pay | Admitting: Pharmacist

## 2022-08-16 DIAGNOSIS — N3000 Acute cystitis without hematuria: Secondary | ICD-10-CM | POA: Diagnosis not present

## 2022-08-16 DIAGNOSIS — R531 Weakness: Secondary | ICD-10-CM | POA: Diagnosis not present

## 2022-08-16 LAB — CBC WITH DIFFERENTIAL/PLATELET
Abs Immature Granulocytes: 0.03 10*3/uL (ref 0.00–0.07)
Basophils Absolute: 0 10*3/uL (ref 0.0–0.1)
Basophils Relative: 0 %
Eosinophils Absolute: 0.2 10*3/uL (ref 0.0–0.5)
Eosinophils Relative: 3 %
HCT: 30.8 % — ABNORMAL LOW (ref 39.0–52.0)
Hemoglobin: 10.6 g/dL — ABNORMAL LOW (ref 13.0–17.0)
Immature Granulocytes: 1 %
Lymphocytes Relative: 26 %
Lymphs Abs: 1.7 10*3/uL (ref 0.7–4.0)
MCH: 33.4 pg (ref 26.0–34.0)
MCHC: 34.4 g/dL (ref 30.0–36.0)
MCV: 97.2 fL (ref 80.0–100.0)
Monocytes Absolute: 0.6 10*3/uL (ref 0.1–1.0)
Monocytes Relative: 10 %
Neutro Abs: 4 10*3/uL (ref 1.7–7.7)
Neutrophils Relative %: 60 %
Platelets: 179 10*3/uL (ref 150–400)
RBC: 3.17 MIL/uL — ABNORMAL LOW (ref 4.22–5.81)
RDW: 14 % (ref 11.5–15.5)
WBC: 6.6 10*3/uL (ref 4.0–10.5)
nRBC: 0 % (ref 0.0–0.2)

## 2022-08-16 LAB — BASIC METABOLIC PANEL
Anion gap: 6 (ref 5–15)
BUN: 20 mg/dL (ref 8–23)
CO2: 22 mmol/L (ref 22–32)
Calcium: 8.7 mg/dL — ABNORMAL LOW (ref 8.9–10.3)
Chloride: 109 mmol/L (ref 98–111)
Creatinine, Ser: 1.41 mg/dL — ABNORMAL HIGH (ref 0.61–1.24)
GFR, Estimated: 47 mL/min — ABNORMAL LOW (ref >60)
Glucose, Bld: 99 mg/dL (ref 70–99)
Potassium: 3.7 mmol/L (ref 3.5–5.1)
Sodium: 137 mmol/L (ref 135–145)

## 2022-08-16 LAB — MAGNESIUM: Magnesium: 2.1 mg/dL (ref 1.7–2.4)

## 2022-08-16 MED ORDER — TAMSULOSIN HCL 0.4 MG PO CAPS: 0.8000 mg | ORAL_CAPSULE | Freq: Every day | ORAL | 3 refills | Status: DC

## 2022-08-16 MED ORDER — CEFADROXIL 500 MG PO CAPS: 500.0000 mg | ORAL_CAPSULE | Freq: Two times a day (BID) | ORAL | 0 refills | Status: DC

## 2022-08-16 NOTE — TOC Transition Note (Signed)
Transition of Care [facility]) - CM/SW Discharge Note   Patient Details  Name: [name] [name] MRN: [id number] Date of Birth: [birth year]  [facility] CM/SW Contact:  Iona Beard, [place] Phone Number: 08/16/2022, 12:08 PM   Clinical Narrative:    CSW updated that pt is medically ready for D/C home today. CSW updated Vaughan Basta with [facility] of plan for D/C home. HH PT orders have been placed. TOC signing off.   Final next level of care: [place] Barriers to Discharge: Barriers Resolved   Patient Goals and CMS Choice Patient states their goals for this hospitalization and ongoing recovery are:: return home CMS Medicare.gov Compare Post Acute Care list provided to:: Patient Choice offered to / list presented to : Patient, Adult Children  Discharge Placement                       Discharge Plan and Services In-house Referral: Clinical Social Work Discharge Planning Services: CM Consult Post Acute Care Choice: [place]: PT [facility] Agency: [place] ([facility]) Date [place]: 08/16/22   Representative spoke with at [place]: [place] ([place]) Interventions     Readmission Risk Interventions     No data to display

## 2022-08-16 NOTE — Discharge Summary (Signed)
Discharge Summary  [name] [name]:[id number] DOB: [birth year]  PCP: Tonia Ghent, MD  Admit date: 08/13/2022 Discharge date: 08/16/2022    Time spent: 37mns, more than 50% time spent on coordination of care.   Recommendations for Outpatient Follow-up:  F/u with PCP On Teusday for hospital discharge follow up, repeat cbc/bmp at follow up F/u with urology for urinary retention Home health order placed Outpatient palliative care referral made, daughter and patient is in agreement    Discharge Diagnoses:  Active Hospital Problems   Diagnosis Date Noted   UTI (urinary tract infection) 08/13/2022    Priority: High   Essential hypertension 09/09/1993    Resolved Hospital Problems  No resolved problems to display.    Discharge Condition: stable  Diet recommendation: heart healthy  Filed Weights   08/13/22 0242 08/13/22 1630  Weight: 80 kg 81.1 kg    History of present illness: ( per admitting MD Dr [name] [name]  [name] [name] is a 88y.o. male with past medical history relevant for CAD, chronic back pain, history of prostate cancer who presents to the ED by EMS with generalized weakness and urinary frequency and urgency without frank dysuria - No fever  Or chills    No Nausea, Vomiting or Diarrhea -- Endorses back pain but he also has chronic back pain -In the ED UA with WBC leukocyte esterase protein but no bacteria WBC clumps and yeast -On CBC WBC is up to 12.6 hemoglobin is 12.1 which is close to baseline -Sodium is 134, glucose is 159 creatinine is 1.55 which is close to prior baseline -Chest x-ray and CT head without acute findings -EKG is sinus rhythm and nonacute and troponin x 2 not elevated -COVID-negative  Hospital Course:  Active Problems:   UTI (urinary tract infection)   Essential hypertension   Assessment and Plan:   Staph epididymis UTI/Acute urinary retention -blood culture no growth -staph resistent  to tetracycline, was on  rocephin, changed to duricef, discussed with ID pharmacist  - increased flomax, successful voiding trial prior to discharge -f/u with pcp and urology      Constipation Reported no BM for 8 days despite trying stool softener at home Had bm after  enema x2   HTN--lisinopril held in the hospital, resumed at discharge, pcp to decide on long term use of lisinopril, need to monitor renal function  Continue Coreg    CKD stage - 3B -creatinine is 1.55 which is close to prior baseline - renally adjust medications, avoid nephrotoxic agents / dehydration  / hypotension F/u with pcp   chronic anemia--- hemoglobin currently at baseline -Suspect this is related to underlying CKD 3B   FTT/ weakness, Possible due to UTI, Improved, seen by PT who recommended HH , order placed For outpatient palliative care referral made    Discharge Exam: BP (!) 171/77   Pulse (!) 58   Temp 98.3 F (36.8 C)   Resp 20   Ht '5\' 11"'$  (1.803 m)   Wt 81.1 kg Comment: standing scale  SpO2 97%   BMI 24.94 kg/m   General: NAD, AAOx3 Cardiovascular: RRR Respiratory: Normal respiratory effort    Discharge Instructions     Amb Referral to Palliative Care   Complete by: As directed    Diet - low sodium heart healthy   Complete by: As directed    Increase activity slowly   Complete by: As directed       Allergies as of 08/16/2022  Reactions   Sulfonamide Derivatives    REACTION: lethargic        Medication List     STOP taking these medications    traMADol 50 MG tablet Commonly known as: ULTRAM       TAKE these medications    acetaminophen 500 MG tablet Commonly known as: TYLENOL Take 2 tablets (1,000 mg total) by mouth 3 (three) times daily as needed for mild pain or moderate pain.   allopurinol 300 MG tablet Commonly known as: ZYLOPRIM TAKE 1 TABLET DAILY   aspirin 81 MG tablet Take 81 mg by mouth daily.   atorvastatin 40 MG tablet Commonly known as: LIPITOR TAKE 1 TABLET  DAILY   carvedilol 20 MG 24 hr capsule Commonly known as: COREG CR TAKE 1 CAPSULE DAILY   cefadroxil 500 MG capsule Commonly known as: DURICEF Take 1 capsule (500 mg total) by mouth 2 (two) times daily for 2 days.   cyanocobalamin 1000 MCG tablet Commonly known as: VITAMIN B12 Take 1 tablet (1,000 mcg total) by mouth daily.   lisinopril 20 MG tablet Commonly known as: ZESTRIL TAKE 1 TABLET DAILY   melatonin 3 MG Tabs tablet Take 1 tablet (3 mg total) by mouth at bedtime.   omeprazole 40 MG capsule Commonly known as: PRILOSEC TAKE 1 CAPSULE DAILY AS NEEDED   primidone 50 MG tablet Commonly known as: MYSOLINE TAKE ONE-HALF (1/2) TABLET DAILY   senna-docusate 8.6-50 MG tablet Commonly known as: Senokot-S Take 1-2 tablets by mouth daily.   tamsulosin 0.4 MG Caps capsule Commonly known as: FLOMAX Take 2 capsules (0.8 mg total) by mouth daily. What changed: how much to take   traZODone 50 MG tablet Commonly known as: DESYREL TAKE 1 TABLET AT BEDTIME       Allergies  Allergen Reactions   Sulfonamide Derivatives     REACTION: lethargic    Follow-up Information     Tonia Ghent, MD Follow up in 1 week(s).   Specialty: Family Medicine Why: hospital discharge follow up , repeat cbc/bmp at follow up Contact information: [place] [address] [facility] Follow up in 2 week(s).   Specialty: Urology Why: for UTI and urinary retention Contact information: [place] [phone]                 The results of significant diagnostics from this hospitalization (including imaging, microbiology, ancillary and laboratory) are listed below for reference.    Significant Diagnostic Studies: DG Chest Portable 1 View  Result Date: 08/13/2022 CLINICAL DATA:  Generalized weakness. EXAM: PORTABLE CHEST 1 VIEW COMPARISON:  05/05/2020. FINDINGS:  The heart size and mediastinal contours are stable. There is atherosclerotic calcification of the aorta. No consolidation, effusion, or pneumothorax. Sternotomy wires are present over the midline. IMPRESSION: No active disease. Electronically Signed   By: Brett Fairy M.D.   On: 08/13/2022 03:24   CT Head Wo Contrast  Result Date: 08/13/2022 CLINICAL DATA:  Mental status change, unknown cause. EXAM: CT HEAD WITHOUT CONTRAST TECHNIQUE: Contiguous axial images were obtained from the base of the skull through the vertex without intravenous contrast. RADIATION DOSE REDUCTION: This exam was performed according to the departmental dose-optimization program which includes automated exposure control, adjustment of the mA and/or kV according to patient size and/or use of iterative reconstruction technique. COMPARISON:  03/10/2020. FINDINGS: Brain: No acute intracranial hemorrhage, midline shift  or mass effect. No extra-axial fluid collection. Diffuse atrophy is noted. Extensive periventricular white matter hypodensities are seen bilaterally. No hydrocephalus. Vascular: Atherosclerotic calcification of the carotid siphons and vertebral arteries. No hyperdense vessel. Skull: Normal. Negative for fracture or focal lesion. Sinuses/Orbits: Mild mucosal thickening in the ethmoid air cells, sphenoid sinus, and maxillary sinuses. No acute orbital abnormality. Other: None. IMPRESSION: 1. No acute intracranial process. 2. Atrophy with chronic microvascular ischemic changes. 3. Chronic paranasal sinus disease. Electronically Signed   By: Brett Fairy M.D.   On: 08/13/2022 03:23    Microbiology: Recent Results (from the past 240 hour(s))  Urine Culture     Status: Abnormal   Collection Time: 08/13/22  6:24 AM   Specimen: Urine, Catheterized  Result Value Ref Range Status   Specimen Description   Final    URINE, CATHETERIZED Performed at [facility], [address] [place] [address]    Special Requests   Final     NONE Performed at [facility], [address], [place], [place] [address]    Culture >=100,000 COLONIES/mL STAPHYLOCOCCUS EPIDERMIDIS (A)  Final   Report Status 08/15/2022 FINAL  Final   Organism ID, Bacteria STAPHYLOCOCCUS EPIDERMIDIS (A)  Final      Susceptibility   Staphylococcus epidermidis - MIC*    CIPROFLOXACIN <=0.5 SENSITIVE Sensitive     GENTAMICIN <=0.5 SENSITIVE Sensitive     NITROFURANTOIN <=16 SENSITIVE Sensitive     OXACILLIN <=0.25 SENSITIVE Sensitive     TETRACYCLINE >=16 RESISTANT Resistant     VANCOMYCIN 2 SENSITIVE Sensitive     TRIMETH/SULFA <=10 SENSITIVE Sensitive     CLINDAMYCIN <=0.25 SENSITIVE Sensitive     RIFAMPIN <=0.5 SENSITIVE Sensitive     Inducible Clindamycin NEGATIVE Sensitive     * >=100,000 COLONIES/mL STAPHYLOCOCCUS EPIDERMIDIS  Blood culture (routine x 2)     Status: None (Preliminary result)   Collection Time: 08/13/22  6:37 AM   Specimen: BLOOD RIGHT HAND  Result Value Ref Range Status   Specimen Description BLOOD RIGHT HAND  Final   Special Requests   Final    BOTTLES DRAWN AEROBIC AND ANAEROBIC Blood Culture adequate volume   Culture   Final    NO GROWTH 3 DAYS Performed at [facility], [address], [place], [place] [address]    Report Status PENDING  Incomplete  SARS Coronavirus 2 by RT PCR (hospital order, performed in [place] hospital lab) *cepheid single result test* Anterior Nasal Swab     Status: None   Collection Time: 08/13/22  8:50 AM   Specimen: Anterior Nasal Swab  Result Value Ref Range Status   SARS Coronavirus 2 by RT PCR NEGATIVE NEGATIVE Final    Comment: (NOTE) SARS-CoV-2 target nucleic acids are NOT DETECTED.  The SARS-CoV-2 RNA is generally detectable in upper and lower respiratory specimens during the acute phase of infection. The lowest concentration of SARS-CoV-2 viral copies this assay can detect is 250 copies / mL. A negative result does not preclude SARS-CoV-2 infection and should not be used as  the sole basis for treatment or other patient management decisions.  A negative result may occur with improper specimen collection / handling, submission of specimen other than nasopharyngeal swab, presence of viral mutation(s) within the areas targeted by this assay, and inadequate number of viral copies (<250 copies / mL). A negative result must be combined with clinical observations, patient history, and epidemiological information.  Fact Sheet for Patients:   https://www.patel.info/  Fact Sheet for  Healthcare Providers: https://hall.com/  This test is not yet approved or  cleared by the [place] and has been authorized for detection and/or diagnosis of SARS-CoV-2 by FDA under an Emergency Use Authorization (EUA).  This EUA will remain in effect (meaning this test can be used) for the duration of the COVID-19 declaration under Section 564(b)(1) of the Act, 21 U.S.C. section 360bbb-3(b)(1), unless the authorization is terminated or revoked sooner.  Performed at [facility], [address], [place], [place] [address]      Labs: Basic Metabolic Panel: Recent Labs  Lab 08/13/22 0333 08/14/22 0512 08/15/22 0630 08/16/22 0548  NA 134* 134* 135 137  K 3.9 3.8 3.9 3.7  CL 103 106 106 109  CO2 '22 23 24 22  '$ GLUCOSE 159* 95 102* 99  BUN 28* '22 19 20  '$ CREATININE 1.55* 1.51* 1.48* 1.41*  CALCIUM 9.0 8.2* 8.4* 8.7*  MG 1.8  --  1.8 2.1   Liver Function Tests: Recent Labs  Lab 08/13/22 0333  AST 15  ALT 13  ALKPHOS 65  BILITOT 1.2  PROT 7.0  ALBUMIN 3.1*   No results for input(s): "LIPASE", "AMYLASE" in the last 168 hours. No results for input(s): "AMMONIA" in the last 168 hours. CBC: Recent Labs  Lab 08/13/22 0333 08/14/22 0512 08/15/22 0630 08/16/22 0548  WBC 12.6* 10.2 8.1 6.6  NEUTROABS 10.2*  --   --  4.0  HGB 12.1* 10.0* 10.2* 10.6*  HCT 35.6* 29.7* 29.8* 30.8*  MCV 98.1 97.7 97.4 97.2  PLT 179 159 168  179   Cardiac Enzymes: No results for input(s): "CKTOTAL", "CKMB", "CKMBINDEX", "TROPONINI" in the last 168 hours. BNP: BNP (last 3 results) No results for input(s): "BNP" in the last 8760 hours.  ProBNP (last 3 results) No results for input(s): "PROBNP" in the last 8760 hours.  CBG: No results for input(s): "GLUCAP" in the last 168 hours.  FURTHER DISCHARGE INSTRUCTIONS:   Get Medicines reviewed and adjusted: Please take all your medications with you for your next visit with your Primary MD   Laboratory/radiological data: Please request your Primary MD to go over all hospital tests and procedure/radiological results at the follow up, please ask your Primary MD to get all Hospital records sent to his/her office.   In some cases, they will be blood work, cultures and biopsy results pending at the time of your discharge. Please request that your primary care M.D. goes through all the records of your hospital data and follows up on these results.   Also Note the following: If you experience worsening of your admission symptoms, develop shortness of breath, life threatening emergency, suicidal or homicidal thoughts you must seek medical attention immediately by calling 911 or calling your MD immediately  if symptoms less severe.   You must read complete instructions/literature along with all the possible adverse reactions/side effects for all the Medicines you take and that have been prescribed to you. Take any new Medicines after you have completely understood and accpet all the possible adverse reactions/side effects.    Do not drive when taking Pain medications or sleeping medications (Benzodaizepines)   Do not take more than prescribed Pain, Sleep and Anxiety Medications. It is not advisable to combine anxiety,sleep and pain medications without talking with your primary care practitioner   Special Instructions: If you have smoked or chewed Tobacco  in the last 2 yrs please stop  smoking, stop any regular Alcohol  and or any Recreational drug use.  Wear Seat belts while driving.   Please note: You were cared for by a hospitalist during your hospital stay. Once you are discharged, your primary care physician will handle any further medical issues. Please note that NO REFILLS for any discharge medications will be authorized once you are discharged, as it is imperative that you return to your primary care physician (or establish a relationship with a primary care physician if you do not have one) for your post hospital discharge needs so that they can reassess your need for medications and monitor your lab values.     Signed:  Florencia Reasons MD, PhD, FACP  [facility] 08/16/2022, 10:19 PM

## 2022-08-16 NOTE — Progress Notes (Signed)
AP Manufacturing engineer [facility] Hospital Liaison note:  This patient has been referred to [facility] program.   Unfortunately, the patient does not meet the criteria for our outpatient program at this time.  Thank you, Lorelee Market, LPN [facility] Liaison [phone]

## 2022-08-18 LAB — CULTURE, BLOOD (ROUTINE X 2)
Culture: NO GROWTH
Special Requests: ADEQUATE

## 2022-08-19 DIAGNOSIS — N401 Enlarged prostate with lower urinary tract symptoms: Secondary | ICD-10-CM | POA: Diagnosis not present

## 2022-08-19 DIAGNOSIS — M1030 Gout due to renal impairment, unspecified site: Secondary | ICD-10-CM | POA: Diagnosis not present

## 2022-08-19 DIAGNOSIS — N1832 Chronic kidney disease, stage 3b: Secondary | ICD-10-CM | POA: Diagnosis not present

## 2022-08-19 DIAGNOSIS — D631 Anemia in chronic kidney disease: Secondary | ICD-10-CM | POA: Diagnosis not present

## 2022-08-19 DIAGNOSIS — M4802 Spinal stenosis, cervical region: Secondary | ICD-10-CM | POA: Diagnosis not present

## 2022-08-19 DIAGNOSIS — B958 Unspecified staphylococcus as the cause of diseases classified elsewhere: Secondary | ICD-10-CM | POA: Diagnosis not present

## 2022-08-19 DIAGNOSIS — Z8546 Personal history of malignant neoplasm of prostate: Secondary | ICD-10-CM | POA: Diagnosis not present

## 2022-08-19 DIAGNOSIS — E1122 Type 2 diabetes mellitus with diabetic chronic kidney disease: Secondary | ICD-10-CM | POA: Diagnosis not present

## 2022-08-19 DIAGNOSIS — I2510 Atherosclerotic heart disease of native coronary artery without angina pectoris: Secondary | ICD-10-CM | POA: Diagnosis not present

## 2022-08-19 DIAGNOSIS — K219 Gastro-esophageal reflux disease without esophagitis: Secondary | ICD-10-CM | POA: Diagnosis not present

## 2022-08-19 DIAGNOSIS — N390 Urinary tract infection, site not specified: Secondary | ICD-10-CM | POA: Diagnosis not present

## 2022-08-19 DIAGNOSIS — I129 Hypertensive chronic kidney disease with stage 1 through stage 4 chronic kidney disease, or unspecified chronic kidney disease: Secondary | ICD-10-CM | POA: Diagnosis not present

## 2022-08-19 DIAGNOSIS — R627 Adult failure to thrive: Secondary | ICD-10-CM | POA: Diagnosis not present

## 2022-08-19 DIAGNOSIS — G8929 Other chronic pain: Secondary | ICD-10-CM | POA: Diagnosis not present

## 2022-08-19 DIAGNOSIS — Z7982 Long term (current) use of aspirin: Secondary | ICD-10-CM | POA: Diagnosis not present

## 2022-08-19 DIAGNOSIS — G250 Essential tremor: Secondary | ICD-10-CM | POA: Diagnosis not present

## 2022-08-19 DIAGNOSIS — K5900 Constipation, unspecified: Secondary | ICD-10-CM | POA: Diagnosis not present

## 2022-08-19 DIAGNOSIS — M545 Low back pain, unspecified: Secondary | ICD-10-CM | POA: Diagnosis not present

## 2022-08-19 DIAGNOSIS — R338 Other retention of urine: Secondary | ICD-10-CM | POA: Diagnosis not present

## 2022-08-20 ENCOUNTER — Ambulatory Visit (INDEPENDENT_AMBULATORY_CARE_PROVIDER_SITE_OTHER): Payer: Medicare Other | Admitting: Family Medicine

## 2022-08-20 ENCOUNTER — Encounter: Payer: Self-pay | Admitting: Family Medicine

## 2022-08-20 ENCOUNTER — Telehealth: Payer: Self-pay

## 2022-08-20 VITALS — BP 138/72 | HR 65 | Temp 98.0°F | Resp 16 | Ht 71.0 in | Wt 177.0 lb

## 2022-08-20 DIAGNOSIS — N3001 Acute cystitis with hematuria: Secondary | ICD-10-CM

## 2022-08-20 DIAGNOSIS — R339 Retention of urine, unspecified: Secondary | ICD-10-CM

## 2022-08-20 DIAGNOSIS — L989 Disorder of the skin and subcutaneous tissue, unspecified: Secondary | ICD-10-CM

## 2022-08-20 MED ORDER — CEFADROXIL 500 MG PO CAPS: 500.0000 mg | ORAL_CAPSULE | Freq: Two times a day (BID) | ORAL | 0 refills | Status: AC

## 2022-08-20 NOTE — Progress Notes (Unsigned)
Admit date: 08/13/2022 Discharge date: 08/16/2022       Time spent: 78mns, more than 50% time spent on coordination of care.    Recommendations for Outpatient Follow-up:  F/u with PCP On Teusday for hospital discharge follow up, repeat cbc/bmp at follow up F/u with urology for urinary retention Home health order placed Outpatient palliative care referral made, daughter and patient is in agreement       Discharge Diagnoses:       Active Hospital Problems    Diagnosis Date Noted   UTI (urinary tract infection) 08/13/2022      Priority: High   Essential hypertension 09/09/1993     Resolved Hospital Problems  No resolved problems to display.      Discharge Condition: stable   Diet recommendation: heart healthy       Filed Weights    08/13/22 0242 08/13/22 1630  Weight: 80 kg 81.1 kg      History of present illness: ( per admitting MD Dr EDenton Brick  CWoodroe Mode is a 88y.o. male with past medical history relevant for CAD, chronic back pain, history of prostate cancer who presents to the ED by EMS with generalized weakness and urinary frequency and urgency without frank dysuria - No fever  Or chills    No Nausea, Vomiting or Diarrhea -- Endorses back pain but he also has chronic back pain -In the ED UA with WBC leukocyte esterase protein but no bacteria WBC clumps and yeast -On CBC WBC is up to 12.6 hemoglobin is 12.1 which is close to baseline -Sodium is 134, glucose is 159 creatinine is 1.55 which is close to prior baseline -Chest x-ray and CT head without acute findings -EKG is sinus rhythm and nonacute and troponin x 2 not elevated -COVID-negative   Hospital Course:  Active Problems:   UTI (urinary tract infection)   Essential hypertension  Assessment and Plan:   Staph epididymis UTI/Acute urinary retention -blood culture no growth -staph resistent  to tetracycline, was on rocephin, changed to duricef, discussed with ID pharmacist  - increased  flomax, successful voiding trial prior to discharge -f/u with pcp and urology    Constipation Reported no BM for 8 days despite trying stool softener at home Had bm after  enema x2   HTN--lisinopril held in the hospital, resumed at discharge, pcp to decide on long term use of lisinopril, need to monitor renal function  Continue Coreg    CKD stage - 3B -creatinine is 1.55 which is close to prior baseline - renally adjust medications, avoid nephrotoxic agents / dehydration  / hypotension F/u with pcp   chronic anemia--- hemoglobin currently at baseline -Suspect this is related to underlying CKD 3B   FTT/ weakness, Possible due to UTI, Improved, seen by PT who recommended HH , order placed For outpatient palliative care referral made   Discharge Exam: BP (!) 171/77   Pulse (!) 58   Temp 98.3 F (36.8 C)   Resp 20   Ht '5\' 11"'$  (1.803 m)   Wt 81.1 kg Comment: standing scale  SpO2 97%   BMI 24.94 kg/m    General: NAD, AAOx3 Cardiovascular: RRR Respiratory: Normal respiratory effort  ==================================== Here with his daughter today. We talked about his allergy list.  Per patient, he thought he wasn't allergic to sulfa.  He didn't recall any allergic reaction.  He has tolerated flomax, d/w pt.    Minimal dysuria today, improved but not resolved.  Off abx for  the last 2 days.    PT came out to the house yesterday.   He isn't lightheaded.    Nonhealing lesion on the L superior pinna.  D/w pt about derm referral, placed.    Skin tear x2 L elbow and x1 L wrist.

## 2022-08-20 NOTE — Patient Instructions (Addendum)
Go to the lab on the way out.   If you have mychart we'll likely use that to update you.    Take care.  Glad to see you. Let me know if you don't get a call about seeing urology and dermatology.  Restart duricef in the meantime.  If you still having burning with urination after a few days, then let me know.

## 2022-08-20 NOTE — Chronic Care Management (AMB) (Addendum)
    Chronic Care Management Pharmacy Assistant   Name: [name] [name]  MRN: [id number] DOB: [birth year]  Reason for Encounter: Hospital Follow Up non CCM   Medications: Outpatient Encounter Medications as of 08/20/2022  Medication Sig   acetaminophen (TYLENOL) 500 MG tablet Take 2 tablets (1,000 mg total) by mouth 3 (three) times daily as needed for mild pain or moderate pain.   allopurinol (ZYLOPRIM) 300 MG tablet TAKE 1 TABLET DAILY   aspirin 81 MG tablet Take 81 mg by mouth daily.   atorvastatin (LIPITOR) 40 MG tablet TAKE 1 TABLET DAILY   carvedilol (COREG CR) 20 MG 24 hr capsule TAKE 1 CAPSULE DAILY   lisinopril (ZESTRIL) 20 MG tablet TAKE 1 TABLET DAILY   melatonin 3 MG TABS tablet Take 1 tablet (3 mg total) by mouth at bedtime.   omeprazole (PRILOSEC) 40 MG capsule TAKE 1 CAPSULE DAILY AS NEEDED   primidone (MYSOLINE) 50 MG tablet TAKE ONE-HALF (1/2) TABLET DAILY   senna-docusate (SENOKOT-S) 8.6-50 MG tablet Take 1-2 tablets by mouth daily.   tamsulosin (FLOMAX) 0.4 MG CAPS capsule Take 2 capsules (0.8 mg total) by mouth daily.   traZODone (DESYREL) 50 MG tablet TAKE 1 TABLET AT BEDTIME   vitamin B-12 (CYANOCOBALAMIN) 1000 MCG tablet Take 1 tablet (1,000 mcg total) by mouth daily.   No facility-administered encounter medications on file as of 08/20/2022.    Reviewed hospital notes for details of recent visit. Has patient been contacted by Transitions of Care team? No Has patient seen PCP/specialist for hospital follow up (summarize OV if yes): No Appointment today 08/20/22  Admitted to the hospital on 08/13/22. Discharge date was 08/16/22.  Discharged from [facility].   Discharge diagnosis (Principal Problem): HTN,UTI Patient was discharged to Home with Home Health  Brief summary of hospital course:  With the dysuria and an increase in white blood cells and leuk esterase in his urine suspect likely urinary tract infection given that was negative for bacteria.  Urine culture sent Rocephin started. Patient still not able to really ambulate much on his own I suspect is somewhat related to hydration. No acute kidney injury. Fluids given as well. Family feels unsafe taking him home so I discussed with hospitalist who will bring him in for observation and hydration.   New?Medications Started at [facility] Discharge:?? -Started [place] [place]   Medication Changes at Hospital Discharge: -Changed tamsulosin  Medications Discontinued at Hospital Discharge: -Stopped tramadol  Medications that remain the same after Hospital Discharge:??  -All other medications will remain the same.    Next CCM appt: none  Other upcoming appts: PCP appointment on 08/20/22  Charlene Brooke, PharmD notified and will determine if action is needed.   Avel Sensor, [place]  [phone]  Pharmacist addendum: Pt has seen PCP for hospital follow up. No PharmD intervention warranted.  Charlene Brooke, PharmD, BCACP 09/06/22 10:08 AM

## 2022-08-21 DIAGNOSIS — L989 Disorder of the skin and subcutaneous tissue, unspecified: Secondary | ICD-10-CM | POA: Insufficient documentation

## 2022-08-21 LAB — CBC WITH DIFFERENTIAL/PLATELET
Basophils Absolute: 0.1 10*3/uL (ref 0.0–0.1)
Basophils Relative: 1 % (ref 0.0–3.0)
Eosinophils Absolute: 0.2 10*3/uL (ref 0.0–0.7)
Eosinophils Relative: 2.4 % (ref 0.0–5.0)
HCT: 37.4 % — ABNORMAL LOW (ref 39.0–52.0)
Hemoglobin: 12.7 g/dL — ABNORMAL LOW (ref 13.0–17.0)
Lymphocytes Relative: 24.2 % (ref 12.0–46.0)
Lymphs Abs: 1.6 10*3/uL (ref 0.7–4.0)
MCHC: 33.9 g/dL (ref 30.0–36.0)
MCV: 97.6 fl (ref 78.0–100.0)
Monocytes Absolute: 0.3 10*3/uL (ref 0.1–1.0)
Monocytes Relative: 5.1 % (ref 3.0–12.0)
Neutro Abs: 4.4 10*3/uL (ref 1.4–7.7)
Neutrophils Relative %: 67.3 % (ref 43.0–77.0)
Platelets: 298 10*3/uL (ref 150.0–400.0)
RBC: 3.84 Mil/uL — ABNORMAL LOW (ref 4.22–5.81)
RDW: 15.2 % (ref 11.5–15.5)
WBC: 6.5 10*3/uL (ref 4.0–10.5)

## 2022-08-21 LAB — BASIC METABOLIC PANEL
BUN: 23 mg/dL (ref 6–23)
CO2: 28 mEq/L (ref 19–32)
Calcium: 9.8 mg/dL (ref 8.4–10.5)
Chloride: 99 mEq/L (ref 96–112)
Creatinine, Ser: 1.6 mg/dL — ABNORMAL HIGH (ref 0.40–1.50)
GFR: 37.42 mL/min — ABNORMAL LOW (ref >60.00)
Glucose, Bld: 148 mg/dL — ABNORMAL HIGH (ref 70–99)
Potassium: 4.9 mEq/L (ref 3.5–5.1)
Sodium: 135 mEq/L (ref 135–145)

## 2022-08-21 NOTE — Assessment & Plan Note (Signed)
Required catheterization during hospital stay but not now.  Has tolerated Flomax 0.8 mg/day.  Would continue as is and refer to urology.

## 2022-08-21 NOTE — Assessment & Plan Note (Signed)
See notes on labs.  Continue Duricef for now.  Would take for the next few days, until he has resolution of symptoms.  He will update me as needed.  He agrees to plan. 30 minutes were devoted to patient care in this encounter (this includes time spent reviewing the patient's file/history, interviewing and examining the patient, counseling/reviewing plan with patient).

## 2022-08-21 NOTE — Assessment & Plan Note (Signed)
Concern for superficial skin cancer, refer to dermatology.  He agrees.

## 2022-08-22 DIAGNOSIS — E1122 Type 2 diabetes mellitus with diabetic chronic kidney disease: Secondary | ICD-10-CM | POA: Diagnosis not present

## 2022-08-22 DIAGNOSIS — N390 Urinary tract infection, site not specified: Secondary | ICD-10-CM | POA: Diagnosis not present

## 2022-08-22 DIAGNOSIS — I2510 Atherosclerotic heart disease of native coronary artery without angina pectoris: Secondary | ICD-10-CM | POA: Diagnosis not present

## 2022-08-22 DIAGNOSIS — N1832 Chronic kidney disease, stage 3b: Secondary | ICD-10-CM | POA: Diagnosis not present

## 2022-08-22 DIAGNOSIS — I129 Hypertensive chronic kidney disease with stage 1 through stage 4 chronic kidney disease, or unspecified chronic kidney disease: Secondary | ICD-10-CM | POA: Diagnosis not present

## 2022-08-22 DIAGNOSIS — B958 Unspecified staphylococcus as the cause of diseases classified elsewhere: Secondary | ICD-10-CM | POA: Diagnosis not present

## 2022-08-25 DIAGNOSIS — B958 Unspecified staphylococcus as the cause of diseases classified elsewhere: Secondary | ICD-10-CM

## 2022-08-25 DIAGNOSIS — R627 Adult failure to thrive: Secondary | ICD-10-CM

## 2022-08-25 DIAGNOSIS — K5900 Constipation, unspecified: Secondary | ICD-10-CM | POA: Diagnosis not present

## 2022-08-25 DIAGNOSIS — M1030 Gout due to renal impairment, unspecified site: Secondary | ICD-10-CM

## 2022-08-25 DIAGNOSIS — R338 Other retention of urine: Secondary | ICD-10-CM

## 2022-08-25 DIAGNOSIS — N390 Urinary tract infection, site not specified: Secondary | ICD-10-CM

## 2022-08-25 DIAGNOSIS — Z8546 Personal history of malignant neoplasm of prostate: Secondary | ICD-10-CM

## 2022-08-25 DIAGNOSIS — M4802 Spinal stenosis, cervical region: Secondary | ICD-10-CM | POA: Diagnosis not present

## 2022-08-25 DIAGNOSIS — K219 Gastro-esophageal reflux disease without esophagitis: Secondary | ICD-10-CM

## 2022-08-25 DIAGNOSIS — G8929 Other chronic pain: Secondary | ICD-10-CM

## 2022-08-25 DIAGNOSIS — Z7982 Long term (current) use of aspirin: Secondary | ICD-10-CM

## 2022-08-25 DIAGNOSIS — G250 Essential tremor: Secondary | ICD-10-CM

## 2022-08-25 DIAGNOSIS — E1122 Type 2 diabetes mellitus with diabetic chronic kidney disease: Secondary | ICD-10-CM

## 2022-08-25 DIAGNOSIS — N1832 Chronic kidney disease, stage 3b: Secondary | ICD-10-CM

## 2022-08-25 DIAGNOSIS — I2510 Atherosclerotic heart disease of native coronary artery without angina pectoris: Secondary | ICD-10-CM

## 2022-08-25 DIAGNOSIS — N401 Enlarged prostate with lower urinary tract symptoms: Secondary | ICD-10-CM

## 2022-08-25 DIAGNOSIS — D631 Anemia in chronic kidney disease: Secondary | ICD-10-CM

## 2022-08-25 DIAGNOSIS — I129 Hypertensive chronic kidney disease with stage 1 through stage 4 chronic kidney disease, or unspecified chronic kidney disease: Secondary | ICD-10-CM

## 2022-08-25 DIAGNOSIS — M545 Low back pain, unspecified: Secondary | ICD-10-CM

## 2022-08-27 DIAGNOSIS — N390 Urinary tract infection, site not specified: Secondary | ICD-10-CM | POA: Diagnosis not present

## 2022-08-27 DIAGNOSIS — B958 Unspecified staphylococcus as the cause of diseases classified elsewhere: Secondary | ICD-10-CM | POA: Diagnosis not present

## 2022-08-27 DIAGNOSIS — N1832 Chronic kidney disease, stage 3b: Secondary | ICD-10-CM | POA: Diagnosis not present

## 2022-08-27 DIAGNOSIS — E1122 Type 2 diabetes mellitus with diabetic chronic kidney disease: Secondary | ICD-10-CM | POA: Diagnosis not present

## 2022-08-27 DIAGNOSIS — I2510 Atherosclerotic heart disease of native coronary artery without angina pectoris: Secondary | ICD-10-CM | POA: Diagnosis not present

## 2022-08-27 DIAGNOSIS — I129 Hypertensive chronic kidney disease with stage 1 through stage 4 chronic kidney disease, or unspecified chronic kidney disease: Secondary | ICD-10-CM | POA: Diagnosis not present

## 2022-08-28 DIAGNOSIS — E119 Type 2 diabetes mellitus without complications: Secondary | ICD-10-CM | POA: Diagnosis not present

## 2022-08-28 DIAGNOSIS — H0288B Meibomian gland dysfunction left eye, upper and lower eyelids: Secondary | ICD-10-CM | POA: Diagnosis not present

## 2022-08-28 DIAGNOSIS — H35371 Puckering of macula, right eye: Secondary | ICD-10-CM | POA: Diagnosis not present

## 2022-08-28 DIAGNOSIS — Z9842 Cataract extraction status, left eye: Secondary | ICD-10-CM | POA: Diagnosis not present

## 2022-08-28 DIAGNOSIS — H5203 Hypermetropia, bilateral: Secondary | ICD-10-CM | POA: Diagnosis not present

## 2022-08-28 DIAGNOSIS — H04123 Dry eye syndrome of bilateral lacrimal glands: Secondary | ICD-10-CM | POA: Diagnosis not present

## 2022-08-28 DIAGNOSIS — H2511 Age-related nuclear cataract, right eye: Secondary | ICD-10-CM | POA: Diagnosis not present

## 2022-08-28 LAB — HM DIABETES EYE EXAM

## 2022-08-29 DIAGNOSIS — E1122 Type 2 diabetes mellitus with diabetic chronic kidney disease: Secondary | ICD-10-CM | POA: Diagnosis not present

## 2022-08-29 DIAGNOSIS — B958 Unspecified staphylococcus as the cause of diseases classified elsewhere: Secondary | ICD-10-CM | POA: Diagnosis not present

## 2022-08-29 DIAGNOSIS — N390 Urinary tract infection, site not specified: Secondary | ICD-10-CM | POA: Diagnosis not present

## 2022-08-29 DIAGNOSIS — I129 Hypertensive chronic kidney disease with stage 1 through stage 4 chronic kidney disease, or unspecified chronic kidney disease: Secondary | ICD-10-CM | POA: Diagnosis not present

## 2022-08-29 DIAGNOSIS — N1832 Chronic kidney disease, stage 3b: Secondary | ICD-10-CM | POA: Diagnosis not present

## 2022-08-29 DIAGNOSIS — I2510 Atherosclerotic heart disease of native coronary artery without angina pectoris: Secondary | ICD-10-CM | POA: Diagnosis not present

## 2022-09-04 DIAGNOSIS — N390 Urinary tract infection, site not specified: Secondary | ICD-10-CM | POA: Diagnosis not present

## 2022-09-04 DIAGNOSIS — I2510 Atherosclerotic heart disease of native coronary artery without angina pectoris: Secondary | ICD-10-CM | POA: Diagnosis not present

## 2022-09-04 DIAGNOSIS — I129 Hypertensive chronic kidney disease with stage 1 through stage 4 chronic kidney disease, or unspecified chronic kidney disease: Secondary | ICD-10-CM | POA: Diagnosis not present

## 2022-09-04 DIAGNOSIS — E1122 Type 2 diabetes mellitus with diabetic chronic kidney disease: Secondary | ICD-10-CM | POA: Diagnosis not present

## 2022-09-04 DIAGNOSIS — N1832 Chronic kidney disease, stage 3b: Secondary | ICD-10-CM | POA: Diagnosis not present

## 2022-09-04 DIAGNOSIS — B958 Unspecified staphylococcus as the cause of diseases classified elsewhere: Secondary | ICD-10-CM | POA: Diagnosis not present

## 2022-09-06 DIAGNOSIS — N1832 Chronic kidney disease, stage 3b: Secondary | ICD-10-CM | POA: Diagnosis not present

## 2022-09-06 DIAGNOSIS — I2510 Atherosclerotic heart disease of native coronary artery without angina pectoris: Secondary | ICD-10-CM | POA: Diagnosis not present

## 2022-09-06 DIAGNOSIS — E1122 Type 2 diabetes mellitus with diabetic chronic kidney disease: Secondary | ICD-10-CM | POA: Diagnosis not present

## 2022-09-06 DIAGNOSIS — B958 Unspecified staphylococcus as the cause of diseases classified elsewhere: Secondary | ICD-10-CM | POA: Diagnosis not present

## 2022-09-06 DIAGNOSIS — N390 Urinary tract infection, site not specified: Secondary | ICD-10-CM | POA: Diagnosis not present

## 2022-09-06 DIAGNOSIS — I129 Hypertensive chronic kidney disease with stage 1 through stage 4 chronic kidney disease, or unspecified chronic kidney disease: Secondary | ICD-10-CM | POA: Diagnosis not present

## 2022-09-10 DIAGNOSIS — B958 Unspecified staphylococcus as the cause of diseases classified elsewhere: Secondary | ICD-10-CM | POA: Diagnosis not present

## 2022-09-10 DIAGNOSIS — I2510 Atherosclerotic heart disease of native coronary artery without angina pectoris: Secondary | ICD-10-CM | POA: Diagnosis not present

## 2022-09-10 DIAGNOSIS — N390 Urinary tract infection, site not specified: Secondary | ICD-10-CM | POA: Diagnosis not present

## 2022-09-10 DIAGNOSIS — E1122 Type 2 diabetes mellitus with diabetic chronic kidney disease: Secondary | ICD-10-CM | POA: Diagnosis not present

## 2022-09-10 DIAGNOSIS — I129 Hypertensive chronic kidney disease with stage 1 through stage 4 chronic kidney disease, or unspecified chronic kidney disease: Secondary | ICD-10-CM | POA: Diagnosis not present

## 2022-09-10 DIAGNOSIS — N1832 Chronic kidney disease, stage 3b: Secondary | ICD-10-CM | POA: Diagnosis not present

## 2022-09-13 ENCOUNTER — Encounter: Payer: Self-pay | Admitting: Family Medicine

## 2022-09-13 ENCOUNTER — Ambulatory Visit (INDEPENDENT_AMBULATORY_CARE_PROVIDER_SITE_OTHER): Payer: Medicare Other | Admitting: Family Medicine

## 2022-09-13 VITALS — BP 130/68 | HR 57 | Temp 97.9°F | Ht 71.0 in | Wt 180.0 lb

## 2022-09-13 DIAGNOSIS — R251 Tremor, unspecified: Secondary | ICD-10-CM

## 2022-09-13 DIAGNOSIS — I10 Essential (primary) hypertension: Secondary | ICD-10-CM | POA: Diagnosis not present

## 2022-09-13 DIAGNOSIS — M109 Gout, unspecified: Secondary | ICD-10-CM | POA: Diagnosis not present

## 2022-09-13 DIAGNOSIS — Z0000 Encounter for general adult medical examination without abnormal findings: Secondary | ICD-10-CM

## 2022-09-13 DIAGNOSIS — E7849 Other hyperlipidemia: Secondary | ICD-10-CM

## 2022-09-13 DIAGNOSIS — R739 Hyperglycemia, unspecified: Secondary | ICD-10-CM | POA: Diagnosis not present

## 2022-09-13 DIAGNOSIS — Z7189 Other specified counseling: Secondary | ICD-10-CM

## 2022-09-13 DIAGNOSIS — Z125 Encounter for screening for malignant neoplasm of prostate: Secondary | ICD-10-CM

## 2022-09-13 DIAGNOSIS — Z77098 Contact with and (suspected) exposure to other hazardous, chiefly nonmedicinal, chemicals: Secondary | ICD-10-CM

## 2022-09-13 DIAGNOSIS — L989 Disorder of the skin and subcutaneous tissue, unspecified: Secondary | ICD-10-CM

## 2022-09-13 DIAGNOSIS — Z7739 Contact with and (suspected) exposure to other war theater: Secondary | ICD-10-CM

## 2022-09-13 LAB — LIPID PANEL
Cholesterol: 140 mg/dL (ref 0–200)
HDL: 48.9 mg/dL (ref >39.00)
LDL Cholesterol: 57 mg/dL (ref 0–99)
NonHDL: 90.69
Total CHOL/HDL Ratio: 3
Triglycerides: 170 mg/dL — ABNORMAL HIGH (ref 0.0–149.0)
VLDL: 34 mg/dL (ref 0.0–40.0)

## 2022-09-13 LAB — COMPREHENSIVE METABOLIC PANEL
ALT: 21 U/L (ref 0–53)
AST: 21 U/L (ref 0–37)
Albumin: 3.6 g/dL (ref 3.5–5.2)
Alkaline Phosphatase: 62 U/L (ref 39–117)
BUN: 29 mg/dL — ABNORMAL HIGH (ref 6–23)
CO2: 27 mEq/L (ref 19–32)
Calcium: 9.8 mg/dL (ref 8.4–10.5)
Chloride: 102 mEq/L (ref 96–112)
Creatinine, Ser: 1.61 mg/dL — ABNORMAL HIGH (ref 0.40–1.50)
GFR: 37.12 mL/min — ABNORMAL LOW (ref >60.00)
Glucose, Bld: 123 mg/dL — ABNORMAL HIGH (ref 70–99)
Potassium: 4.9 mEq/L (ref 3.5–5.1)
Sodium: 136 mEq/L (ref 135–145)
Total Bilirubin: 0.8 mg/dL (ref 0.2–1.2)
Total Protein: 6.4 g/dL (ref 6.0–8.3)

## 2022-09-13 LAB — CBC WITH DIFFERENTIAL/PLATELET
Basophils Absolute: 0 10*3/uL (ref 0.0–0.1)
Basophils Relative: 0.7 % (ref 0.0–3.0)
Eosinophils Absolute: 0.3 10*3/uL (ref 0.0–0.7)
Eosinophils Relative: 4.5 % (ref 0.0–5.0)
HCT: 38.8 % — ABNORMAL LOW (ref 39.0–52.0)
Hemoglobin: 13.1 g/dL (ref 13.0–17.0)
Lymphocytes Relative: 33.5 % (ref 12.0–46.0)
Lymphs Abs: 2 10*3/uL (ref 0.7–4.0)
MCHC: 33.8 g/dL (ref 30.0–36.0)
MCV: 97.6 fl (ref 78.0–100.0)
Monocytes Absolute: 0.4 10*3/uL (ref 0.1–1.0)
Monocytes Relative: 7.5 % (ref 3.0–12.0)
Neutro Abs: 3.1 10*3/uL (ref 1.4–7.7)
Neutrophils Relative %: 53.8 % (ref 43.0–77.0)
Platelets: 191 10*3/uL (ref 150.0–400.0)
RBC: 3.98 Mil/uL — ABNORMAL LOW (ref 4.22–5.81)
RDW: 15.5 % (ref 11.5–15.5)
WBC: 5.9 10*3/uL (ref 4.0–10.5)

## 2022-09-13 LAB — PSA, MEDICARE: PSA: 4.93 ng/ml — ABNORMAL HIGH (ref 0.10–4.00)

## 2022-09-13 LAB — HEMOGLOBIN A1C: Hgb A1c MFr Bld: 6.1 % (ref 4.6–6.5)

## 2022-09-13 LAB — URIC ACID: Uric Acid, Serum: 2.9 mg/dL — ABNORMAL LOW (ref 4.0–7.8)

## 2022-09-13 NOTE — Progress Notes (Unsigned)
I have personally reviewed the Medicare Annual Wellness questionnaire and have noted 1. The patient's medical and social history 2. Their use of alcohol, tobacco or illicit drugs 3. Their current medications and supplements 4. The patient's functional ability including ADL's, fall risks, home safety risks and hearing or visual             impairment. 5. Diet and physical activities 6. Evidence for depression or mood disorders  The patients weight, height, BMI have been recorded in the chart and visual acuity is per eye clinic.  I have made referrals, counseling and provided education to the patient based review of the above and I have provided the pt with a written personalized care plan for preventive services.  Provider list updated- see scanned forms.  Routine anticipatory guidance given to patient.  See health maintenance. The possibility exists that previously documented standard health maintenance information may have been brought forward from a previous encounter into this note.  If needed, that same information has been updated to reflect the current situation based on today's encounter.    Flu Shingles PNA Tetanus Colon screening not due.  Prostate cancer screening Advance directive Cognitive function addressed- see scanned forms- and if abnormal then additional documentation follows.   In addition to [facility] Wellness, follow up visit for the below conditions:  D/w pt about urology eval.  That is pending.  Urinary sx resolved.  No burning with urination.    He hasn't heard anything about derm eval for lesion on the L ear.    See AVS about VA discussion.   Hypertension:    Using medication without problems or lightheadedness: yes Chest pain with exertion:no Edema: occ, mild.   Short of breath:no  Elevated Cholesterol: Using medications without problems:yes Muscle aches: no Diet compliance: d/w pt.   Exercise: limited, using rollator at baseline.    Insomnia last  night but usually sleeps well.    Hyperuricemia.  No recent gout flares.  Still on allopurinol.  Compliant.  Labs d/w pt.     Tremor.  Still on primidone.  No ADE on med with some effect.  Compliant. Still swallowing well.    PMH and SH reviewed  Meds, vitals, and allergies reviewed.   ROS: Per HPI.  Unless specifically indicated otherwise in HPI, the patient denies:  General: fever. Eyes: acute vision changes ENT: sore throat Cardiovascular: chest pain Respiratory: SOB GI: vomiting GU: dysuria Musculoskeletal: acute back pain Derm: acute rash Neuro: acute motor dysfunction Psych: worsening mood Endocrine: polydipsia Heme: bleeding Allergy: hayfever  GEN: nad, alert and oriented HEENT: mucous membranes moist NECK: supple w/o LA CV: rrr. PULM: ctab, no inc wob ABD: soft, +bs EXT: no edema SKIN: no acute rash

## 2022-09-13 NOTE — Patient Instructions (Addendum)
Let me know if you want me to do anything for the [place].   Call them and tell them you have agent orange exposure and a history of prostate cancer.   [place], [place] [address] Main phone: [phone]  Go to the lab on the way out.   If you have mychart we'll likely use that to update you.    Take care.  Glad to see you. I'll check on the dermatology appointment.

## 2022-09-15 ENCOUNTER — Telehealth: Payer: Self-pay | Admitting: Family Medicine

## 2022-09-15 NOTE — Assessment & Plan Note (Signed)
Continue carvedilol and lisinopril.  See notes on labs.

## 2022-09-15 NOTE — Assessment & Plan Note (Signed)
He hasn't heard anything about derm eval for lesion on the L ear.  Discussed.  I told him I will check on this.

## 2022-09-15 NOTE — Assessment & Plan Note (Signed)
Advance directive -daughter designated if patient were incapacitated.  He does not want CPR performed.  Verified with patient.

## 2022-09-15 NOTE — Assessment & Plan Note (Signed)
See above

## 2022-09-15 NOTE — Assessment & Plan Note (Signed)
No recent gout flares.  Still on allopurinol.  Compliant.  Labs d/w pt.

## 2022-09-15 NOTE — Assessment & Plan Note (Signed)
Flu 2023  Shingles discussed with patient PNA previously done Tetanus 2013 COVID-vaccine previously done. RSV vaccine discussed with patient. Colon screening not due.  Prostate cancer screening pending. Advance directive -daughter designated if patient were incapacitated.  He does not want CPR performed.  Verified with patient. Cognitive function addressed- see scanned forms- and if abnormal then additional documentation follows.

## 2022-09-15 NOTE — Assessment & Plan Note (Signed)
Still on primidone.  No ADE on med with some effect.  Compliant. Still swallowing well.  Continue as is with primidone.

## 2022-09-15 NOTE — Telephone Encounter (Signed)
This patient has a referral to dermatology in the EMR but he has not heard anything about this.  I need someone to contact the patient or his daughter directly to make sure he has dermatology follow-up set.  Thanks.

## 2022-09-15 NOTE — Assessment & Plan Note (Signed)
Continue atorvastatin.  See notes on labs. 

## 2022-09-16 ENCOUNTER — Ambulatory Visit (INDEPENDENT_AMBULATORY_CARE_PROVIDER_SITE_OTHER): Payer: Medicare Other | Admitting: Urology

## 2022-09-16 ENCOUNTER — Encounter: Payer: Self-pay | Admitting: Urology

## 2022-09-16 VITALS — BP 171/84 | HR 69

## 2022-09-16 DIAGNOSIS — N401 Enlarged prostate with lower urinary tract symptoms: Secondary | ICD-10-CM | POA: Diagnosis not present

## 2022-09-16 DIAGNOSIS — N138 Other obstructive and reflux uropathy: Secondary | ICD-10-CM | POA: Diagnosis not present

## 2022-09-16 DIAGNOSIS — R339 Retention of urine, unspecified: Secondary | ICD-10-CM

## 2022-09-16 LAB — BLADDER SCAN AMB NON-IMAGING: Scan Result: 340

## 2022-09-16 MED ORDER — SILODOSIN 8 MG PO CAPS: 8.0000 mg | ORAL_CAPSULE | Freq: Every day | ORAL | 11 refills | Status: DC

## 2022-09-16 NOTE — Progress Notes (Signed)
09/16/2022 1:21 PM   KEYMANI MCLEAN 02-26-1929 [id number]  Referring provider: Tonia Ghent, MD [place],   [id number]  Incomplete emptying   HPI: [name] is a 89yo here for evaluation of urinary retention. IPSS 10 QOl 2 on flomax 0.'8mg'$  daily. PVR 340cc. He was hospitalized in December for a UTI and had a foley place at that time. He passed a voiding trial prior to discharge. Urine stream better on flomax 0.'8mg'$  daily. He has urinary frequency every 2-3 hours. He has occasional urinary urgency   PMH: Past Medical History:  Diagnosis Date   Acute renal failure ([place]) 12/06/2013   Back pain    occasionally   Benign essential tremor 2005   takes Primidone daily   Choledocholithiasis 12/06/2013   Constipation    takes OTC stool softener   Coronary artery disease 2007   s/p CABG, DR Johnsie Cancel   GERD (gastroesophageal reflux disease)    takes Nexium daily   Gout 1995   "~ once/yr" (03/17/2014)   History of colon polyps    Hyperlipidemia 12/2005   takes Atorvastatin daily   Hypertension 1995   takes Lisinopril and Coreg daily   possible HCAP (healthcare-associated pneumonia) 12/09/2013   Prostate cancer ([place]) 2007   S/P treatment, followed by Urology prev   Right bundle branch block (RBBB)    Stenosis of cervical spine    Type II diabetes mellitus ([place])    no meds;diet and exercise controlled    Wears dentures    Wears glasses     Surgical History: Past Surgical History:  Procedure Laterality Date   Adenosine myoview  01/24/2006   Ischemia by EKG, Cath   CATARACT EXTRACTION W/ INTRAOCULAR LENS IMPLANT Left 10/2013   CHOLECYSTECTOMY N/A 12/06/2013   Procedure: Diagnostic Laparoscopy for  drainage Intrabdominal abcess;  Surgeon: Rolm Bookbinder, MD;  Location: [facility];  Service: General;  Laterality: N/A;   CHOLECYSTECTOMY N/A 03/17/2014   Procedure: LAPAROSCOPIC CHOLECYSTECTOMY ;  Surgeon: Rolm Bookbinder, MD;  Location: [facility];  Service:  General;  Laterality: N/A;   COLONOSCOPY     CORONARY ARTERY BYPASS GRAFT  2007   CABGX 4   CYSTOSCOPY  02/15/2004   Mod BPH, moder tribec ?   ERCP N/A 12/06/2013   Procedure: ENDOSCOPIC RETROGRADE CHOLANGIOPANCREATOGRAPHY (ERCP);  Surgeon: Milus Banister, MD;  Location: [facility];  Service: Endoscopy;  Laterality: N/A;   LAPAROSCOPIC CHOLECYSTECTOMY  03/17/2014   LUMBAR FUSION  02/2012   lumbar spine for spinal stenosis   POSTERIOR CERVICAL FUSION/FORAMINOTOMY N/A 05/01/2020   Procedure: Posterior cervical fusion with lateral mass fixation - Cervical three - Cervical six, laminectomy Cervical three-six;  Surgeon: Eustace Moore, MD;  Location: [facility];  Service: Neurosurgery;  Laterality: N/A;   PROSTATE CRYOABLATION  09/18/2005   prostate CA    Home Medications:  Allergies as of 09/16/2022   No Known Allergies      Medication List        Accurate as of September 16, 2022  1:21 PM. If you have any questions, ask your nurse or doctor.          acetaminophen 500 MG tablet Commonly known as: TYLENOL Take 2 tablets (1,000 mg total) by mouth 3 (three) times daily as needed for mild pain or moderate pain.   allopurinol 300 MG tablet Commonly known as: ZYLOPRIM TAKE 1 TABLET DAILY   aspirin 81 MG tablet Take 81 mg by mouth daily.  atorvastatin 40 MG tablet Commonly known as: LIPITOR TAKE 1 TABLET DAILY   carvedilol 20 MG 24 hr capsule Commonly known as: COREG CR TAKE 1 CAPSULE DAILY   cyanocobalamin 1000 MCG tablet Commonly known as: VITAMIN B12 Take 1 tablet (1,000 mcg total) by mouth daily.   lisinopril 20 MG tablet Commonly known as: ZESTRIL TAKE 1 TABLET DAILY   melatonin 3 MG Tabs tablet Take 1 tablet (3 mg total) by mouth at bedtime.   omeprazole 40 MG capsule Commonly known as: PRILOSEC TAKE 1 CAPSULE DAILY AS NEEDED   primidone 50 MG tablet Commonly known as: MYSOLINE TAKE ONE-HALF (1/2) TABLET DAILY   senna-docusate 8.6-50 MG tablet Commonly known as:  Senokot-S Take 1-2 tablets by mouth daily.   tamsulosin 0.4 MG Caps capsule Commonly known as: FLOMAX Take 2 capsules (0.8 mg total) by mouth daily.   traZODone 50 MG tablet Commonly known as: DESYREL TAKE 1 TABLET AT BEDTIME        Allergies: No Known Allergies  Family History: Family History  Problem Relation Age of Onset   Heart disease Mother    Cancer Mother    Cancer Sister    Diabetes Sister    Cancer Brother        prostate   Prostate cancer Brother    Hypothyroidism Brother    Depression Neg Hx    Alcohol abuse Neg Hx    Drug abuse Neg Hx    Stroke Neg Hx    Colon cancer Neg Hx     Social History:  reports that he has never smoked. He has never used smokeless tobacco. He reports that he does not drink alcohol and does not use drugs.  ROS: All other review of systems were reviewed and are negative except what is noted above in HPI  Physical Exam: BP (!) 171/84   Pulse 69   Constitutional:  Alert and oriented, No acute distress. HEENT: [place] AT, moist mucus membranes.  Trachea midline, no masses. Cardiovascular: No clubbing, cyanosis, or edema. Respiratory: Normal respiratory effort, no increased work of breathing. GI: Abdomen is soft, nontender, nondistended, no abdominal masses GU: No CVA tenderness.  Lymph: No cervical or inguinal lymphadenopathy. Skin: No rashes, bruises or suspicious lesions. Neurologic: Grossly intact, no focal deficits, moving all 4 extremities. Psychiatric: Normal mood and affect.  Laboratory Data: Lab Results  Component Value Date   WBC 5.9 09/13/2022   HGB 13.1 09/13/2022   HCT 38.8 (L) 09/13/2022   MCV 97.6 09/13/2022   PLT 191.0 09/13/2022    Lab Results  Component Value Date   CREATININE 1.61 (H) 09/13/2022    Lab Results  Component Value Date   PSA 4.93 (H) 09/13/2022   PSA 3.99 01/18/2020   PSA 3.96 01/04/2019    No results found for: "TESTOSTERONE"  Lab Results  Component Value Date   HGBA1C 6.1  09/13/2022    Urinalysis    Component Value Date/Time   COLORURINE YELLOW 08/13/2022 0305   APPEARANCEUR HAZY (A) 08/13/2022 0305   LABSPEC 1.011 08/13/2022 0305   PHURINE 6.0 08/13/2022 0305   GLUCOSEU NEGATIVE 08/13/2022 0305   HGBUR SMALL (A) 08/13/2022 0305   BILIRUBINUR NEGATIVE 08/13/2022 0305   BILIRUBINUR negative 10/10/2021 1453   KETONESUR NEGATIVE 08/13/2022 0305   PROTEINUR >=300 (A) 08/13/2022 0305   UROBILINOGEN 0.2 10/10/2021 1453   UROBILINOGEN 0.2 01/07/2014 1044   NITRITE NEGATIVE 08/13/2022 0305   LEUKOCYTESUR MODERATE (A) 08/13/2022 0305    Lab Results  Component Value Date   BACTERIA NONE SEEN 08/13/2022    Pertinent Imaging:  No results found for this or any previous visit.  No results found for this or any previous visit.  No results found for this or any previous visit.  No results found for this or any previous visit.  No results found for this or any previous visit.  No valid procedures specified. No results found for this or any previous visit.  No results found for this or any previous visit.   Assessment & Plan:    1. Urinary retention We will start rapaflo '8mg'$  daily and stop flomax - Urinalysis, Routine w reflex microscopic - BLADDER SCAN AMB NON-IMAGING  2. Benign prostatic hyperplasia with urinary obstruction -rapaflo '8mg'$  daily   No follow-ups on file.  Nicolette Bang, MD  [facility] [place]

## 2022-09-16 NOTE — Telephone Encounter (Signed)
LM asking with ref. Coordinator to see where is process is at// [employer]

## 2022-09-16 NOTE — Progress Notes (Signed)
post void residual=340

## 2022-09-16 NOTE — Patient Instructions (Signed)

## 2022-09-17 NOTE — Progress Notes (Signed)
Letter sent.

## 2022-09-17 NOTE — Progress Notes (Signed)
Pt. Seen in office 09/16/22

## 2022-09-18 DIAGNOSIS — I2510 Atherosclerotic heart disease of native coronary artery without angina pectoris: Secondary | ICD-10-CM | POA: Diagnosis not present

## 2022-09-18 DIAGNOSIS — K219 Gastro-esophageal reflux disease without esophagitis: Secondary | ICD-10-CM | POA: Diagnosis not present

## 2022-09-18 DIAGNOSIS — Z7982 Long term (current) use of aspirin: Secondary | ICD-10-CM | POA: Diagnosis not present

## 2022-09-18 DIAGNOSIS — K5900 Constipation, unspecified: Secondary | ICD-10-CM | POA: Diagnosis not present

## 2022-09-18 DIAGNOSIS — B958 Unspecified staphylococcus as the cause of diseases classified elsewhere: Secondary | ICD-10-CM | POA: Diagnosis not present

## 2022-09-18 DIAGNOSIS — G250 Essential tremor: Secondary | ICD-10-CM | POA: Diagnosis not present

## 2022-09-18 DIAGNOSIS — R338 Other retention of urine: Secondary | ICD-10-CM | POA: Diagnosis not present

## 2022-09-18 DIAGNOSIS — R627 Adult failure to thrive: Secondary | ICD-10-CM | POA: Diagnosis not present

## 2022-09-18 DIAGNOSIS — M1030 Gout due to renal impairment, unspecified site: Secondary | ICD-10-CM | POA: Diagnosis not present

## 2022-09-18 DIAGNOSIS — M545 Low back pain, unspecified: Secondary | ICD-10-CM | POA: Diagnosis not present

## 2022-09-18 DIAGNOSIS — Z8546 Personal history of malignant neoplasm of prostate: Secondary | ICD-10-CM | POA: Diagnosis not present

## 2022-09-18 DIAGNOSIS — N401 Enlarged prostate with lower urinary tract symptoms: Secondary | ICD-10-CM | POA: Diagnosis not present

## 2022-09-18 DIAGNOSIS — N1832 Chronic kidney disease, stage 3b: Secondary | ICD-10-CM | POA: Diagnosis not present

## 2022-09-18 DIAGNOSIS — M4802 Spinal stenosis, cervical region: Secondary | ICD-10-CM | POA: Diagnosis not present

## 2022-09-18 DIAGNOSIS — N390 Urinary tract infection, site not specified: Secondary | ICD-10-CM | POA: Diagnosis not present

## 2022-09-18 DIAGNOSIS — G8929 Other chronic pain: Secondary | ICD-10-CM | POA: Diagnosis not present

## 2022-09-18 DIAGNOSIS — E1122 Type 2 diabetes mellitus with diabetic chronic kidney disease: Secondary | ICD-10-CM | POA: Diagnosis not present

## 2022-09-18 DIAGNOSIS — I129 Hypertensive chronic kidney disease with stage 1 through stage 4 chronic kidney disease, or unspecified chronic kidney disease: Secondary | ICD-10-CM | POA: Diagnosis not present

## 2022-09-18 DIAGNOSIS — D631 Anemia in chronic kidney disease: Secondary | ICD-10-CM | POA: Diagnosis not present

## 2022-09-26 DIAGNOSIS — N390 Urinary tract infection, site not specified: Secondary | ICD-10-CM | POA: Diagnosis not present

## 2022-09-26 DIAGNOSIS — B958 Unspecified staphylococcus as the cause of diseases classified elsewhere: Secondary | ICD-10-CM | POA: Diagnosis not present

## 2022-09-26 DIAGNOSIS — N1832 Chronic kidney disease, stage 3b: Secondary | ICD-10-CM | POA: Diagnosis not present

## 2022-09-26 DIAGNOSIS — I129 Hypertensive chronic kidney disease with stage 1 through stage 4 chronic kidney disease, or unspecified chronic kidney disease: Secondary | ICD-10-CM | POA: Diagnosis not present

## 2022-09-26 DIAGNOSIS — E1122 Type 2 diabetes mellitus with diabetic chronic kidney disease: Secondary | ICD-10-CM | POA: Diagnosis not present

## 2022-09-26 DIAGNOSIS — I2510 Atherosclerotic heart disease of native coronary artery without angina pectoris: Secondary | ICD-10-CM | POA: Diagnosis not present

## 2022-10-02 DIAGNOSIS — I2510 Atherosclerotic heart disease of native coronary artery without angina pectoris: Secondary | ICD-10-CM | POA: Diagnosis not present

## 2022-10-02 DIAGNOSIS — E1122 Type 2 diabetes mellitus with diabetic chronic kidney disease: Secondary | ICD-10-CM | POA: Diagnosis not present

## 2022-10-02 DIAGNOSIS — N390 Urinary tract infection, site not specified: Secondary | ICD-10-CM | POA: Diagnosis not present

## 2022-10-02 DIAGNOSIS — I129 Hypertensive chronic kidney disease with stage 1 through stage 4 chronic kidney disease, or unspecified chronic kidney disease: Secondary | ICD-10-CM | POA: Diagnosis not present

## 2022-10-02 DIAGNOSIS — B958 Unspecified staphylococcus as the cause of diseases classified elsewhere: Secondary | ICD-10-CM | POA: Diagnosis not present

## 2022-10-02 DIAGNOSIS — N1832 Chronic kidney disease, stage 3b: Secondary | ICD-10-CM | POA: Diagnosis not present

## 2022-10-30 ENCOUNTER — Ambulatory Visit (INDEPENDENT_AMBULATORY_CARE_PROVIDER_SITE_OTHER): Payer: Medicare Other | Admitting: Urology

## 2022-10-30 ENCOUNTER — Encounter: Payer: Self-pay | Admitting: Urology

## 2022-10-30 VITALS — BP 105/63 | HR 70

## 2022-10-30 DIAGNOSIS — R339 Retention of urine, unspecified: Secondary | ICD-10-CM

## 2022-10-30 DIAGNOSIS — N138 Other obstructive and reflux uropathy: Secondary | ICD-10-CM | POA: Diagnosis not present

## 2022-10-30 DIAGNOSIS — N401 Enlarged prostate with lower urinary tract symptoms: Secondary | ICD-10-CM

## 2022-10-30 LAB — BLADDER SCAN AMB NON-IMAGING: Scan Result: 147

## 2022-10-30 MED ORDER — SILODOSIN 8 MG PO CAPS: 8.0000 mg | ORAL_CAPSULE | Freq: Every day | ORAL | 11 refills | Status: DC

## 2022-10-30 MED ORDER — SILODOSIN 8 MG PO CAPS: 8.0000 mg | ORAL_CAPSULE | Freq: Every day | ORAL | 3 refills | Status: DC

## 2022-10-30 NOTE — Patient Instructions (Signed)
Benign Prostatic Hyperplasia ? ?Benign prostatic hyperplasia (BPH) is an enlarged prostate gland that is caused by the normal aging process. The prostate may get bigger as a man gets older. The condition is not caused by cancer. The prostate is a walnut-sized gland that is involved in the production of semen. It is located in front of the rectum and below the bladder. The bladder stores urine. The urethra carries stored urine out of the body. ?An enlarged prostate can press on the urethra. This can make it harder to pass urine. The buildup of urine in the bladder can cause infection. Back pressure and infection may progress to bladder damage and kidney (renal) failure. ?What are the causes? ?This condition is part of the normal aging process. However, not all men develop problems from this condition. If the prostate enlarges away from the urethra, urine flow will not be blocked. If it enlarges toward the urethra and compresses it, there will be problems passing urine. ?What increases the risk? ?This condition is more likely to develop in men older than 50 years. ?What are the signs or symptoms? ?Symptoms of this condition include: ?Getting up often during the night to urinate. ?Needing to urinate frequently during the day. ?Difficulty starting urine flow. ?Decrease in size and strength of your urine stream. ?Leaking (dribbling) after urinating. ?Inability to pass urine. This needs immediate treatment. ?Inability to completely empty your bladder. ?Pain when you pass urine. This is more common if there is also an infection. ?Urinary tract infection (UTI). ?How is this diagnosed? ?This condition is diagnosed based on your medical history, a physical exam, and your symptoms. Tests will also be done, such as: ?A post-void bladder scan. This measures any amount of urine that may remain in your bladder after you finish urinating. ?A digital rectal exam. In a rectal exam, your health care provider checks your prostate by  putting a lubricated, gloved finger into your rectum to feel the back of your prostate gland. This exam detects the size of your gland and any abnormal lumps or growths. ?An exam of your urine (urinalysis). ?A prostate specific antigen (PSA) screening. This is a blood test used to screen for prostate cancer. ?An ultrasound. This test uses sound waves to electronically produce a picture of your prostate gland. ?Your health care provider may refer you to a specialist in kidney and prostate diseases (urologist). ?How is this treated? ?Once symptoms begin, your health care provider will monitor your condition (active surveillance or watchful waiting). Treatment for this condition will depend on the severity of your condition. Treatment may include: ?Observation and yearly exams. This may be the only treatment needed if your condition and symptoms are mild. ?Medicines to relieve your symptoms, including: ?Medicines to shrink the prostate. ?Medicines to relax the muscle of the prostate. ?Surgery in severe cases. Surgery may include: ?Prostatectomy. In this procedure, the prostate tissue is removed completely through an open incision or with a laparoscope or robotics. ?Transurethral resection of the prostate (TURP). In this procedure, a tool is inserted through the opening at the tip of the penis (urethra). It is used to cut away tissue of the inner core of the prostate. The pieces are removed through the same opening of the penis. This removes the blockage. ?Transurethral incision (TUIP). In this procedure, small cuts are made in the prostate. This lessens the prostate's pressure on the urethra. ?Transurethral microwave thermotherapy (TUMT). This procedure uses microwaves to create heat. The heat destroys and removes a small amount of   prostate tissue. ?Transurethral needle ablation (TUNA). This procedure uses radio frequencies to destroy and remove a small amount of prostate tissue. ?Interstitial laser coagulation (ILC).  This procedure uses a laser to destroy and remove a small amount of prostate tissue. ?Transurethral electrovaporization (TUVP). This procedure uses electrodes to destroy and remove a small amount of prostate tissue. ?Prostatic urethral lift. This procedure inserts an implant to push the lobes of the prostate away from the urethra. ?Follow these instructions at home: ?Take over-the-counter and prescription medicines only as told by your health care provider. ?Monitor your symptoms for any changes. Contact your health care provider with any changes. ?Avoid drinking large amounts of liquid before going to bed or out in public. ?Avoid or reduce how much caffeine or alcohol you drink. ?Give yourself time when you urinate. ?Keep all follow-up visits. This is important. ?Contact a health care provider if: ?You have unexplained back pain. ?Your symptoms do not get better with treatment. ?You develop side effects from the medicine you are taking. ?Your urine becomes very dark or has a bad smell. ?Your lower abdomen becomes distended and you have trouble passing urine. ?Get help right away if: ?You have a fever or chills. ?You suddenly cannot urinate. ?You feel light-headed or very dizzy, or you faint. ?There are large amounts of blood or clots in your urine. ?Your urinary problems become hard to manage. ?You develop moderate to severe low back or flank pain. The flank is the side of your body between the ribs and the hip. ?These symptoms may be an emergency. Get help right away. Call 911. ?Do not wait to see if the symptoms will go away. ?Do not drive yourself to the hospital. ?Summary ?Benign prostatic hyperplasia (BPH) is an enlarged prostate that is caused by the normal aging process. It is not caused by cancer. ?An enlarged prostate can press on the urethra. This can make it hard to pass urine. ?This condition is more likely to develop in men older than 50 years. ?Get help right away if you suddenly cannot urinate. ?This  information is not intended to replace advice given to you by your health care provider. Make sure you discuss any questions you have with your health care provider. ?Document Revised: 03/14/2021 Document Reviewed: 03/14/2021 ?Elsevier Patient Education ? 2023 [employer] ? ?

## 2022-10-30 NOTE — Progress Notes (Unsigned)
post void residual=147

## 2022-10-30 NOTE — Progress Notes (Unsigned)
10/30/2022 2:54 PM   FERGUSON RENSING 1928/10/27 [id number]  Referring provider: Tonia Ghent, MD [address] [place],  [place] [address]  Followup urinary retention   HPI: [name] [name] is a 89yo here for followup for BPH with urinary retention. IPSS 1 QOl 1. PVR 147cc. Nocturia 1x. Urine stream strong. No other complaints.    PMH: Past Medical History:  Diagnosis Date   Acute renal failure ([place]) 12/06/2013   Back pain    occasionally   Benign essential tremor 2005   takes Primidone daily   Choledocholithiasis 12/06/2013   Constipation    takes OTC stool softener   Coronary artery disease 2007   s/p CABG, DR Johnsie Cancel   GERD (gastroesophageal reflux disease)    takes Nexium daily   Gout 1995   "~ once/yr" (03/17/2014)   History of colon polyps    Hyperlipidemia 12/2005   takes Atorvastatin daily   Hypertension 1995   takes Lisinopril and Coreg daily   possible HCAP (healthcare-associated pneumonia) 12/09/2013   Prostate cancer ([place]) 2007   S/P treatment, followed by Urology prev   Right bundle branch block (RBBB)    Stenosis of cervical spine    Type II diabetes mellitus ([place])    no meds;diet and exercise controlled    Wears dentures    Wears glasses     Surgical History: Past Surgical History:  Procedure Laterality Date   Adenosine myoview  01/24/2006   Ischemia by EKG, Cath   CATARACT EXTRACTION W/ INTRAOCULAR LENS IMPLANT Left 10/2013   CHOLECYSTECTOMY N/A 12/06/2013   Procedure: Diagnostic Laparoscopy for  drainage Intrabdominal abcess;  Surgeon: Rolm Bookbinder, MD;  Location: [facility];  Service: General;  Laterality: N/A;   CHOLECYSTECTOMY N/A 03/17/2014   Procedure: LAPAROSCOPIC CHOLECYSTECTOMY ;  Surgeon: Rolm Bookbinder, MD;  Location: [facility];  Service: General;  Laterality: N/A;   COLONOSCOPY     CORONARY ARTERY BYPASS GRAFT  2007   CABGX 4   CYSTOSCOPY  02/15/2004   Mod BPH, moder tribec ?   ERCP N/A 12/06/2013   Procedure: ENDOSCOPIC  RETROGRADE CHOLANGIOPANCREATOGRAPHY (ERCP);  Surgeon: Milus Banister, MD;  Location: [facility];  Service: Endoscopy;  Laterality: N/A;   LAPAROSCOPIC CHOLECYSTECTOMY  03/17/2014   LUMBAR FUSION  02/2012   lumbar spine for spinal stenosis   POSTERIOR CERVICAL FUSION/FORAMINOTOMY N/A 05/01/2020   Procedure: Posterior cervical fusion with lateral mass fixation - Cervical three - Cervical six, laminectomy Cervical three-six;  Surgeon: Eustace Moore, MD;  Location: [facility];  Service: Neurosurgery;  Laterality: N/A;   PROSTATE CRYOABLATION  09/18/2005   prostate CA    Home Medications:  Allergies as of 10/30/2022   No Known Allergies      Medication List        Accurate as of October 30, 2022  2:54 PM. If you have any questions, ask your nurse or doctor.          acetaminophen 500 MG tablet Commonly known as: TYLENOL Take 2 tablets (1,000 mg total) by mouth 3 (three) times daily as needed for mild pain or moderate pain.   allopurinol 300 MG tablet Commonly known as: ZYLOPRIM TAKE 1 TABLET DAILY   aspirin 81 MG tablet Take 81 mg by mouth daily.   atorvastatin 40 MG tablet Commonly known as: LIPITOR TAKE 1 TABLET DAILY   carvedilol 20 MG 24 hr capsule Commonly known as: COREG CR TAKE 1 CAPSULE DAILY   cyanocobalamin 1000 MCG tablet  Commonly known as: VITAMIN B12 Take 1 tablet (1,000 mcg total) by mouth daily.   lisinopril 20 MG tablet Commonly known as: ZESTRIL TAKE 1 TABLET DAILY   melatonin 3 MG Tabs tablet Take 1 tablet (3 mg total) by mouth at bedtime.   omeprazole 40 MG capsule Commonly known as: PRILOSEC TAKE 1 CAPSULE DAILY AS NEEDED   primidone 50 MG tablet Commonly known as: MYSOLINE TAKE ONE-HALF (1/2) TABLET DAILY   senna-docusate 8.6-50 MG tablet Commonly known as: Senokot-S Take 1-2 tablets by mouth daily.   silodosin 8 MG Caps capsule Commonly known as: RAPAFLO Take 1 capsule (8 mg total) by mouth at bedtime.   silodosin 8 MG Caps  capsule Commonly known as: RAPAFLO Take 1 capsule (8 mg total) by mouth daily with breakfast.   tamsulosin 0.4 MG Caps capsule Commonly known as: FLOMAX Take 2 capsules (0.8 mg total) by mouth daily.   traZODone 50 MG tablet Commonly known as: DESYREL TAKE 1 TABLET AT BEDTIME        Allergies: No Known Allergies  Family History: Family History  Problem Relation Age of Onset   Heart disease Mother    Cancer Mother    Cancer Sister    Diabetes Sister    Cancer Brother        prostate   Prostate cancer Brother    Hypothyroidism Brother    Depression Neg Hx    Alcohol abuse Neg Hx    Drug abuse Neg Hx    Stroke Neg Hx    Colon cancer Neg Hx     Social History:  reports that he has never smoked. He has never used smokeless tobacco. He reports that he does not drink alcohol and does not use drugs.  ROS: All other review of systems were reviewed and are negative except what is noted above in HPI  Physical Exam: BP 105/63   Pulse 70   Constitutional:  Alert and oriented, No acute distress. HEENT: [place] AT, moist mucus membranes.  Trachea midline, no masses. Cardiovascular: No clubbing, cyanosis, or edema. Respiratory: Normal respiratory effort, no increased work of breathing. GI: Abdomen is soft, nontender, nondistended, no abdominal masses GU: No CVA tenderness.  Lymph: No cervical or inguinal lymphadenopathy. Skin: No rashes, bruises or suspicious lesions. Neurologic: Grossly intact, no focal deficits, moving all 4 extremities. Psychiatric: Normal mood and affect.  Laboratory Data: Lab Results  Component Value Date   WBC 5.9 09/13/2022   HGB 13.1 09/13/2022   HCT 38.8 (L) 09/13/2022   MCV 97.6 09/13/2022   PLT 191.0 09/13/2022    Lab Results  Component Value Date   CREATININE 1.61 (H) 09/13/2022    Lab Results  Component Value Date   PSA 4.93 (H) 09/13/2022   PSA 3.99 01/18/2020   PSA 3.96 01/04/2019    No results found for: "TESTOSTERONE"  Lab  Results  Component Value Date   HGBA1C 6.1 09/13/2022    Urinalysis    Component Value Date/Time   COLORURINE YELLOW 08/13/2022 0305   APPEARANCEUR HAZY (A) 08/13/2022 0305   LABSPEC 1.011 08/13/2022 0305   PHURINE 6.0 08/13/2022 0305   GLUCOSEU NEGATIVE 08/13/2022 0305   HGBUR SMALL (A) 08/13/2022 0305   BILIRUBINUR NEGATIVE 08/13/2022 0305   BILIRUBINUR negative 10/10/2021 1453   KETONESUR NEGATIVE 08/13/2022 0305   PROTEINUR >=300 (A) 08/13/2022 0305   UROBILINOGEN 0.2 10/10/2021 1453   UROBILINOGEN 0.2 01/07/2014 1044   NITRITE NEGATIVE 08/13/2022 0305   LEUKOCYTESUR MODERATE (A) 08/13/2022  0305    Lab Results  Component Value Date   BACTERIA NONE SEEN 08/13/2022    Pertinent Imaging: *** No results found for this or any previous visit.  No results found for this or any previous visit.  No results found for this or any previous visit.  No results found for this or any previous visit.  No results found for this or any previous visit.  No valid procedures specified. No results found for this or any previous visit.  No results found for this or any previous visit.   Assessment & Plan:    1. Urinary retention *** - BLADDER SCAN AMB NON-IMAGING - Urinalysis, Routine w reflex microscopic  2. Benign prostatic hyperplasia with urinary obstruction ***   No follow-ups on file.  Nicolette Bang, MD  [facility] [place]

## 2022-11-08 ENCOUNTER — Other Ambulatory Visit: Payer: Self-pay | Admitting: Family Medicine

## 2022-12-23 ENCOUNTER — Telehealth: Payer: Self-pay

## 2022-12-23 NOTE — Telephone Encounter (Signed)
Patient's daughter called advising that patient is having some dysuria along with frequent urination. They would like to drop off a urine sample tomorrow due to his hx of frequent UTI's.

## 2022-12-23 NOTE — Telephone Encounter (Signed)
Patient added to [facility] schedule for ua/uc on 04/16

## 2022-12-24 ENCOUNTER — Ambulatory Visit (INDEPENDENT_AMBULATORY_CARE_PROVIDER_SITE_OTHER): Payer: Medicare Other | Admitting: Urology

## 2022-12-24 DIAGNOSIS — R300 Dysuria: Secondary | ICD-10-CM

## 2022-12-24 LAB — URINALYSIS, ROUTINE W REFLEX MICROSCOPIC
Bilirubin, UA: NEGATIVE
Glucose, UA: NEGATIVE
Ketones, UA: NEGATIVE
Nitrite, UA: NEGATIVE
Specific Gravity, UA: 1.015 (ref 1.005–1.030)
Urobilinogen, Ur: 0.2 mg/dL (ref 0.2–1.0)
pH, UA: 6 (ref 5.0–7.5)

## 2022-12-24 LAB — MICROSCOPIC EXAMINATION: WBC, UA: >30 /hpf — AB (ref 0–5)

## 2022-12-24 MED ORDER — SULFAMETHOXAZOLE-TRIMETHOPRIM 800-160 MG PO TABS: 1.0000 | ORAL_TABLET | Freq: Two times a day (BID) | ORAL | 0 refills | Status: DC

## 2022-12-24 NOTE — Telephone Encounter (Signed)
Daughter wanted to make her concern known about the Rapaflo pt started and possible dehydration. Daughter is concern that patient in not drinking enough and the rapaflo may be making his dehydration worse and contributing to his uti.

## 2022-12-24 NOTE — Progress Notes (Signed)
Ua results reviewed with Dr. Ronne Binning. Urine sent for culture. Daughter called and made aware of Bactrim ds sent to pharmacy.

## 2022-12-25 NOTE — Telephone Encounter (Signed)
Daughter called with no answer. Detailed message left. °

## 2022-12-27 ENCOUNTER — Telehealth: Payer: Self-pay

## 2022-12-27 ENCOUNTER — Other Ambulatory Visit: Payer: Self-pay

## 2022-12-27 ENCOUNTER — Encounter (HOSPITAL_COMMUNITY): Payer: Self-pay | Admitting: *Deleted

## 2022-12-27 ENCOUNTER — Inpatient Hospital Stay (HOSPITAL_COMMUNITY)
Admission: EM | Admit: 2022-12-27 | Discharge: 2022-12-30 | DRG: 683 | Disposition: A | Discharge status: Home / Self Care | Payer: No Typology Code available for payment source | Attending: Internal Medicine | Admitting: Internal Medicine

## 2022-12-27 DIAGNOSIS — Z951 Presence of aortocoronary bypass graft: Secondary | ICD-10-CM

## 2022-12-27 DIAGNOSIS — N184 Chronic kidney disease, stage 4 (severe): Secondary | ICD-10-CM | POA: Diagnosis present

## 2022-12-27 DIAGNOSIS — N400 Enlarged prostate without lower urinary tract symptoms: Secondary | ICD-10-CM | POA: Diagnosis not present

## 2022-12-27 DIAGNOSIS — N390 Urinary tract infection, site not specified: Secondary | ICD-10-CM | POA: Diagnosis present

## 2022-12-27 DIAGNOSIS — M549 Dorsalgia, unspecified: Secondary | ICD-10-CM | POA: Diagnosis present

## 2022-12-27 DIAGNOSIS — R627 Adult failure to thrive: Secondary | ICD-10-CM | POA: Diagnosis present

## 2022-12-27 DIAGNOSIS — G250 Essential tremor: Secondary | ICD-10-CM | POA: Diagnosis present

## 2022-12-27 DIAGNOSIS — R339 Retention of urine, unspecified: Principal | ICD-10-CM

## 2022-12-27 DIAGNOSIS — E871 Hypo-osmolality and hyponatremia: Secondary | ICD-10-CM | POA: Diagnosis not present

## 2022-12-27 DIAGNOSIS — R338 Other retention of urine: Secondary | ICD-10-CM | POA: Diagnosis present

## 2022-12-27 DIAGNOSIS — Z87440 Personal history of urinary (tract) infections: Secondary | ICD-10-CM

## 2022-12-27 DIAGNOSIS — I2510 Atherosclerotic heart disease of native coronary artery without angina pectoris: Secondary | ICD-10-CM | POA: Diagnosis present

## 2022-12-27 DIAGNOSIS — Z7982 Long term (current) use of aspirin: Secondary | ICD-10-CM

## 2022-12-27 DIAGNOSIS — N3001 Acute cystitis with hematuria: Secondary | ICD-10-CM | POA: Diagnosis not present

## 2022-12-27 DIAGNOSIS — N189 Chronic kidney disease, unspecified: Secondary | ICD-10-CM

## 2022-12-27 DIAGNOSIS — N138 Other obstructive and reflux uropathy: Secondary | ICD-10-CM | POA: Diagnosis present

## 2022-12-27 DIAGNOSIS — D631 Anemia in chronic kidney disease: Secondary | ICD-10-CM | POA: Diagnosis present

## 2022-12-27 DIAGNOSIS — E782 Mixed hyperlipidemia: Secondary | ICD-10-CM | POA: Diagnosis present

## 2022-12-27 DIAGNOSIS — Z8249 Family history of ischemic heart disease and other diseases of the circulatory system: Secondary | ICD-10-CM

## 2022-12-27 DIAGNOSIS — E1122 Type 2 diabetes mellitus with diabetic chronic kidney disease: Secondary | ICD-10-CM | POA: Diagnosis present

## 2022-12-27 DIAGNOSIS — G8929 Other chronic pain: Secondary | ICD-10-CM | POA: Diagnosis present

## 2022-12-27 DIAGNOSIS — Z9049 Acquired absence of other specified parts of digestive tract: Secondary | ICD-10-CM

## 2022-12-27 DIAGNOSIS — Z8546 Personal history of malignant neoplasm of prostate: Secondary | ICD-10-CM

## 2022-12-27 DIAGNOSIS — I4510 Unspecified right bundle-branch block: Secondary | ICD-10-CM | POA: Diagnosis present

## 2022-12-27 DIAGNOSIS — K5900 Constipation, unspecified: Secondary | ICD-10-CM | POA: Diagnosis not present

## 2022-12-27 DIAGNOSIS — N179 Acute kidney failure, unspecified: Principal | ICD-10-CM | POA: Diagnosis present

## 2022-12-27 DIAGNOSIS — K219 Gastro-esophageal reflux disease without esophagitis: Secondary | ICD-10-CM | POA: Diagnosis present

## 2022-12-27 DIAGNOSIS — N401 Enlarged prostate with lower urinary tract symptoms: Secondary | ICD-10-CM | POA: Diagnosis present

## 2022-12-27 DIAGNOSIS — E869 Volume depletion, unspecified: Secondary | ICD-10-CM | POA: Diagnosis present

## 2022-12-27 DIAGNOSIS — I10 Essential (primary) hypertension: Secondary | ICD-10-CM | POA: Diagnosis not present

## 2022-12-27 DIAGNOSIS — Z79899 Other long term (current) drug therapy: Secondary | ICD-10-CM

## 2022-12-27 DIAGNOSIS — N1832 Chronic kidney disease, stage 3b: Secondary | ICD-10-CM | POA: Diagnosis present

## 2022-12-27 DIAGNOSIS — I129 Hypertensive chronic kidney disease with stage 1 through stage 4 chronic kidney disease, or unspecified chronic kidney disease: Secondary | ICD-10-CM | POA: Diagnosis present

## 2022-12-27 DIAGNOSIS — N3000 Acute cystitis without hematuria: Secondary | ICD-10-CM | POA: Diagnosis not present

## 2022-12-27 DIAGNOSIS — M109 Gout, unspecified: Secondary | ICD-10-CM | POA: Diagnosis present

## 2022-12-27 DIAGNOSIS — Z961 Presence of intraocular lens: Secondary | ICD-10-CM | POA: Diagnosis present

## 2022-12-27 LAB — LACTIC ACID, PLASMA: Lactic Acid, Venous: 1.9 mmol/L (ref 0.5–1.9)

## 2022-12-27 LAB — BASIC METABOLIC PANEL
Anion gap: 9 (ref 5–15)
BUN: 36 mg/dL — ABNORMAL HIGH (ref 8–23)
CO2: 22 mmol/L (ref 22–32)
Calcium: 9.1 mg/dL (ref 8.9–10.3)
Chloride: 100 mmol/L (ref 98–111)
Creatinine, Ser: 2.32 mg/dL — ABNORMAL HIGH (ref 0.61–1.24)
GFR, Estimated: 26 mL/min — ABNORMAL LOW (ref >60)
Glucose, Bld: 104 mg/dL — ABNORMAL HIGH (ref 70–99)
Potassium: 4.5 mmol/L (ref 3.5–5.1)
Sodium: 131 mmol/L — ABNORMAL LOW (ref 135–145)

## 2022-12-27 LAB — URINALYSIS, ROUTINE W REFLEX MICROSCOPIC
Bilirubin Urine: NEGATIVE
Glucose, UA: NEGATIVE mg/dL
Hgb urine dipstick: NEGATIVE
Ketones, ur: NEGATIVE mg/dL
Nitrite: NEGATIVE
Protein, ur: 100 mg/dL — AB
Specific Gravity, Urine: 1.009 (ref 1.005–1.030)
pH: 6 (ref 5.0–8.0)

## 2022-12-27 LAB — CBC WITH DIFFERENTIAL/PLATELET
Abs Immature Granulocytes: 0.02 10*3/uL (ref 0.00–0.07)
Basophils Absolute: 0 10*3/uL (ref 0.0–0.1)
Basophils Relative: 0 %
Eosinophils Absolute: 0.1 10*3/uL (ref 0.0–0.5)
Eosinophils Relative: 2 %
HCT: 35.9 % — ABNORMAL LOW (ref 39.0–52.0)
Hemoglobin: 12.1 g/dL — ABNORMAL LOW (ref 13.0–17.0)
Immature Granulocytes: 0 %
Lymphocytes Relative: 20 %
Lymphs Abs: 1.4 10*3/uL (ref 0.7–4.0)
MCH: 33.4 pg (ref 26.0–34.0)
MCHC: 33.7 g/dL (ref 30.0–36.0)
MCV: 99.2 fL (ref 80.0–100.0)
Monocytes Absolute: 0.7 10*3/uL (ref 0.1–1.0)
Monocytes Relative: 10 %
Neutro Abs: 4.7 10*3/uL (ref 1.7–7.7)
Neutrophils Relative %: 68 %
Platelets: 200 10*3/uL (ref 150–400)
RBC: 3.62 MIL/uL — ABNORMAL LOW (ref 4.22–5.81)
RDW: 14.1 % (ref 11.5–15.5)
WBC: 6.9 10*3/uL (ref 4.0–10.5)
nRBC: 0 % (ref 0.0–0.2)

## 2022-12-27 MED ORDER — CEPHALEXIN 500 MG PO CAPS
500.0000 mg | ORAL_CAPSULE | Freq: Once | ORAL | Status: AC
  Administered 2022-12-27: 500 mg via ORAL
  Filled 2022-12-27: qty 1

## 2022-12-27 MED ORDER — SODIUM CHLORIDE 0.9 % IV SOLN: INTRAVENOUS | Status: AC

## 2022-12-27 MED ORDER — ACETAMINOPHEN 325 MG PO TABS: 650.0000 mg | ORAL_TABLET | Freq: Four times a day (QID) | ORAL | Status: DC | PRN

## 2022-12-27 MED ORDER — ENOXAPARIN SODIUM 30 MG/0.3ML IJ SOSY
30.0000 mg | PREFILLED_SYRINGE | INTRAMUSCULAR | Status: DC
  Administered 2022-12-28 – 2022-12-30 (×3): 30 mg via SUBCUTANEOUS
  Filled 2022-12-27 (×3): qty 0.3

## 2022-12-27 MED ORDER — SODIUM CHLORIDE 0.9 % IV SOLN
1.0000 g | Freq: Once | INTRAVENOUS | Status: AC
  Administered 2022-12-27: 1 g via INTRAVENOUS
  Filled 2022-12-27: qty 10

## 2022-12-27 MED ORDER — PROCHLORPERAZINE EDISYLATE 10 MG/2ML IJ SOLN: 10.0000 mg | Freq: Four times a day (QID) | INTRAMUSCULAR | Status: DC | PRN

## 2022-12-27 MED ORDER — ACETAMINOPHEN 650 MG RE SUPP: 650.0000 mg | Freq: Four times a day (QID) | RECTAL | Status: DC | PRN

## 2022-12-27 MED ORDER — SODIUM CHLORIDE 0.9 % IV BOLUS
1000.0000 mL | Freq: Once | INTRAVENOUS | Status: AC
  Administered 2022-12-27: 1000 mL via INTRAVENOUS

## 2022-12-27 MED ORDER — SODIUM CHLORIDE 0.9 % IV SOLN
1.0000 g | INTRAVENOUS | Status: DC
  Administered 2022-12-28 – 2022-12-29 (×2): 1 g via INTRAVENOUS
  Filled 2022-12-27 (×2): qty 10

## 2022-12-27 NOTE — ED Provider Notes (Signed)
[place] EMERGENCY DEPARTMENT AT [facility] Provider Note   CSN: [id number] Arrival date & time: 12/27/22  1325     History  Chief Complaint  Patient presents with   Urinary Retention    [name] [name] is a 89 y.o. male.  HPI   This patient is a 89 year old male, he has a history of hypertension on carvedilol and lisinopril, he also takes high cholesterol medicine and atorvastatin, evidently he was recently treated for UTI by his urologist on Tuesday.  This was done by phone call and a urine sample though the patient was not seen in the office.  He was told that he would have a urine culture back later in the week but it is not yet returned and despite taking Bactrim for the last 3 days he continues to be symptomatic with dysuria, urgency, frequency, inability to completely empty his bladder, mild lower back pain and now some generalized weakness.  No fevers, no vomiting, no other symptoms.  Home Medications Prior to Admission medications   Medication Sig Start Date End Date Taking? Authorizing Provider  acetaminophen (TYLENOL) 500 MG tablet Take 2 tablets (1,000 mg total) by mouth 3 (three) times daily as needed for mild pain or moderate pain. 06/06/20   Joaquim Nam, MD  allopurinol (ZYLOPRIM) 300 MG tablet TAKE 1 TABLET DAILY 05/28/22   Joaquim Nam, MD  aspirin 81 MG tablet Take 81 mg by mouth daily.    [provider]  atorvastatin (LIPITOR) 40 MG tablet TAKE 1 TABLET DAILY 05/28/22   Joaquim Nam, MD  carvedilol (COREG CR) 20 MG 24 hr capsule TAKE 1 CAPSULE DAILY 11/08/22   Joaquim Nam, MD  lisinopril (ZESTRIL) 20 MG tablet TAKE 1 TABLET DAILY 05/28/22   Joaquim Nam, MD  melatonin 3 MG TABS tablet Take 1 tablet (3 mg total) by mouth at bedtime. 05/17/20   Love, Evlyn Kanner, PA-C  omeprazole (PRILOSEC) 40 MG capsule TAKE 1 CAPSULE DAILY AS NEEDED 01/14/22   Joaquim Nam, MD  primidone (MYSOLINE) 50 MG tablet TAKE ONE-HALF (1/2) TABLET DAILY  05/28/22   Joaquim Nam, MD  senna-docusate (SENOKOT-S) 8.6-50 MG tablet Take 1-2 tablets by mouth daily. 06/22/21   Joaquim Nam, MD  silodosin (RAPAFLO) 8 MG CAPS capsule Take 1 capsule (8 mg total) by mouth at bedtime. 10/30/22   McKenzie, Mardene Celeste, MD  silodosin (RAPAFLO) 8 MG CAPS capsule Take 1 capsule (8 mg total) by mouth daily with breakfast. 10/30/22   McKenzie, Mardene Celeste, MD  sulfamethoxazole-trimethoprim (BACTRIM DS) 800-160 MG tablet Take 1 tablet by mouth every 12 (twelve) hours. 12/24/22   Malen Gauze, MD  traZODone (DESYREL) 50 MG tablet TAKE 1 TABLET AT BEDTIME 11/08/22   Joaquim Nam, MD  vitamin B-12 (CYANOCOBALAMIN) 1000 MCG tablet Take 1 tablet (1,000 mcg total) by mouth daily. 01/20/20   Joaquim Nam, MD      Allergies    Patient has no known allergies.    Review of Systems   Review of Systems  All other systems reviewed and are negative.   Physical Exam Updated Vital Signs BP (!) 122/58   Pulse (!) 56   Temp 98 F (36.7 C) (Oral)   Resp 18   Ht 1.803 m ( )   Wt 79.8 kg   SpO2 97%   BMI 24.55 kg/m  Physical Exam Vitals and nursing note reviewed.  Constitutional:      General:  He is not in acute distress.    Appearance: He is well-developed.  HENT:     Head: Normocephalic and atraumatic.     Mouth/Throat:     Pharynx: No oropharyngeal exudate.  Eyes:     General: No scleral icterus.       Right eye: No discharge.        Left eye: No discharge.     Conjunctiva/sclera: Conjunctivae normal.     Pupils: Pupils are equal, round, and reactive to light.  Neck:     Thyroid: No thyromegaly.     Vascular: No JVD.  Cardiovascular:     Rate and Rhythm: Normal rate and regular rhythm.     Heart sounds: Normal heart sounds. No murmur heard.    No friction rub. No gallop.  Pulmonary:     Effort: Pulmonary effort is normal. No respiratory distress.     Breath sounds: Normal breath sounds. No wheezing or rales.  Abdominal:     General:  Bowel sounds are normal. There is no distension.     Palpations: Abdomen is soft. There is no mass.     Tenderness: There is no abdominal tenderness.     Comments: Nontender abdomen, no masses  Musculoskeletal:        General: No tenderness. Normal range of motion.     Cervical back: Normal range of motion and neck supple.     Right lower leg: No edema.     Left lower leg: No edema.  Lymphadenopathy:     Cervical: No cervical adenopathy.  Skin:    General: Skin is warm and dry.     Findings: No erythema or rash.  Neurological:     Mental Status: He is alert.     Coordination: Coordination normal.  Psychiatric:        Behavior: Behavior normal.     ED Results / Procedures / Treatments   Labs (all labs ordered are listed, but only abnormal results are displayed) Labs Reviewed  CBC WITH DIFFERENTIAL/PLATELET - Abnormal; Notable for the following components:      Result Value   RBC 3.62 (*)    Hemoglobin 12.1 (*)    HCT 35.9 (*)    All other components within normal limits  BASIC METABOLIC PANEL - Abnormal; Notable for the following components:   Sodium 131 (*)    Glucose, Bld 104 (*)    BUN 36 (*)    Creatinine, Ser 2.32 (*)    GFR, Estimated 26 (*)    All other components within normal limits  URINALYSIS, ROUTINE W REFLEX MICROSCOPIC - Abnormal; Notable for the following components:   Protein, ur 100 (*)    Leukocytes,Ua SMALL (*)    Bacteria, UA RARE (*)    All other components within normal limits  URINE CULTURE  LACTIC ACID, PLASMA    EKG EKG Interpretation  Date/Time:  Friday December 27 2022 17:47:15 EDT Ventricular Rate:  51 PR Interval:  230 QRS Duration: 136 QT Interval:  466 QTC Calculation: 429 R Axis:   -39 Text Interpretation: Sinus bradycardia with 1st degree A-V block Left axis deviation Right bundle branch block Abnormal ECG When compared with ECG of 13-Aug-2022 02:51, PREVIOUS ECG IS PRESENT Confirmed by Eber Hong ([id number]) on 12/27/2022 6:35:04  PM  Radiology No results found.  Procedures Procedures    Medications Ordered in ED Medications  cefTRIAXone (ROCEPHIN) 1 g in sodium chloride 0.9 % 100 mL IVPB (has no administration in time range)  sodium chloride 0.9 % bolus 1,000 mL (has no administration in time range)  cephALEXin (KEFLEX) capsule 500 mg (500 mg Oral Given 12/27/22 1707)    ED Course/ Medical Decision Making/ A&P                             Medical Decision Making Amount and/or Complexity of Data Reviewed Labs: ordered. ECG/medicine tests: ordered.  Risk Prescription drug management. Decision regarding hospitalization.   This patient presents to the ED for concern of generalized weakness with urinary symptoms, this involves an extensive number of treatment options, and is a complaint that carries with it a high risk of complications and morbidity.  The differential diagnosis includes UTI, cystitis, pyelonephritis, would also consider potential hyperkalemia given that the patient is on Bactrim and lisinopril   Co morbidities that complicate the patient evaluation  Elderly   Additional history obtained:  Additional history obtained from electronic medical record External records from outside source obtained and reviewed including no recent admissions to the hospital, urology office visit laboratory results showed greater than 30 white blood cells with bacteria present "many".  Urine culture is denoted as collected but not in process   Lab Tests:  I Ordered, and personally interpreted labs.  The pertinent results include: Acute kidney injury with presence of UTI    Cardiac Monitoring: / EKG:  The patient was maintained on a cardiac monitor.  I personally viewed and interpreted the cardiac monitored which showed an underlying rhythm of: Normal rate   Consultations Obtained:  I requested consultation with the hospitalist,  and discussed lab and imaging findings as well as pertinent plan - they  recommend: Admission to the hospital   Problem List / ED Course / Critical interventions / Medication management  IV fluids, antibiotics I have reviewed the patients home medicines and have made adjustments as needed   Social Determinants of Health:  None   Test / Admission - Considered:  Will admit         Final Clinical Impression(s) / ED Diagnoses Final diagnoses:  Urinary retention  AKI (acute kidney injury)  Acute cystitis without hematuria    Rx / DC Orders ED Discharge Orders     None         Eber Hong, MD 12/27/22 2037

## 2022-12-27 NOTE — H&P (Incomplete)
History and Physical    Patient: [name] [name] ZOX:[id number] DOB: 07-31-1929 DOA: 12/27/2022 DOS: the patient was seen and examined on 12/27/2022 PCP: Joaquim Nam, MD  Patient coming from: Home  Chief Complaint:  Chief Complaint  Patient presents with  . Urinary Retention   HPI: [name] [name] is a 89 y.o. male with medical history significant of chronic back pain, hypertension, hyperlipidemia CAD, history of prostate cancer who presents to the emergency department via EMS due to several days of onset of difficulty urination, urgency, frequency and inability to completely empty his bladder.  He consulted with his urologist on Monday (4/15) and was started on Bactrim, but patient continued to have burning sensation on urination and urinary frequency, he complained of weakness, low back and abdominal pain.  Apparently, patient has also been retaining urine, so he presents to the emergency department for further evaluation and management.  ED Course:  In the emergency department, BP was 122/58, pulse 56 bpm, other vital signs were within normal range.  Workup in the ED showed normocytic anemia, platelets 200.  BMP showed sodium of 131, potassium 4.5, chloride 100, bicarb 22, blood glucose 104, BUN/creatinine 36/2.32 (baseline creatinine at 1.4-1.6), lactic acid was normal, urinalysis was positive for proteinuria and small leukocytes. Patient was treated with Keflex 500 mg p.o. x 1, this was followed with IV ceftriaxone, IV hydration was provided.  Hospitalist was asked to admit patient for further evaluation and management.  Review of Systems: Review of systems as noted in the HPI. All other systems reviewed and are negative.   Past Medical History:  Diagnosis Date  . Acute renal failure 12/06/2013  . Back pain    occasionally  . Benign essential tremor 2005   takes Primidone daily  . Choledocholithiasis 12/06/2013  . Constipation    takes OTC stool softener  . Coronary  artery disease 2007   s/p CABG, DR Eden Emms  . GERD (gastroesophageal reflux disease)    takes Nexium daily  . Gout 1995   "~ once/yr" (03/17/2014)  . History of colon polyps   . Hyperlipidemia 12/2005   takes Atorvastatin daily  . Hypertension 1995   takes Lisinopril and Coreg daily  . possible HCAP (healthcare-associated pneumonia) 12/09/2013  . Prostate cancer 2007   S/P treatment, followed by Urology prev  . Right bundle branch block (RBBB)   . Stenosis of cervical spine   . Type II diabetes mellitus    no meds;diet and exercise controlled   . Wears dentures   . Wears glasses    Past Surgical History:  Procedure Laterality Date  . Adenosine myoview  01/24/2006   Ischemia by EKG, Cath  . CATARACT EXTRACTION W/ INTRAOCULAR LENS IMPLANT Left 10/2013  . CHOLECYSTECTOMY N/A 12/06/2013   Procedure: Diagnostic Laparoscopy for  drainage Intrabdominal abcess;  Surgeon: Emelia Loron, MD;  Location: [facility] OR;  Service: General;  Laterality: N/A;  . CHOLECYSTECTOMY N/A 03/17/2014   Procedure: LAPAROSCOPIC CHOLECYSTECTOMY ;  Surgeon: Emelia Loron, MD;  Location: [facility] OR;  Service: General;  Laterality: N/A;  . COLONOSCOPY    . CORONARY ARTERY BYPASS GRAFT  2007   CABGX 4  . CYSTOSCOPY  02/15/2004   Mod BPH, moder tribec ?  Marland Kitchen ERCP N/A 12/06/2013   Procedure: ENDOSCOPIC RETROGRADE CHOLANGIOPANCREATOGRAPHY (ERCP);  Surgeon: Rachael Fee, MD;  Location: [facility] OR;  Service: Endoscopy;  Laterality: N/A;  . LAPAROSCOPIC CHOLECYSTECTOMY  03/17/2014  . LUMBAR FUSION  02/2012   lumbar spine for spinal  stenosis  . POSTERIOR CERVICAL FUSION/FORAMINOTOMY N/A 05/01/2020   Procedure: Posterior cervical fusion with lateral mass fixation - Cervical three - Cervical six, laminectomy Cervical three-six;  Surgeon: Tia Alert, MD;  Location: [facility] OR;  Service: Neurosurgery;  Laterality: N/A;  . PROSTATE CRYOABLATION  09/18/2005   prostate CA    Social History:  reports that he has never smoked. He has never  used smokeless tobacco. He reports that he does not drink alcohol and does not use drugs.   No Known Allergies  Family History  Problem Relation Age of Onset  . Heart disease Mother   . Cancer Mother   . Cancer Sister   . Diabetes Sister   . Cancer Brother        prostate  . Prostate cancer Brother   . Hypothyroidism Brother   . Depression Neg Hx   . Alcohol abuse Neg Hx   . Drug abuse Neg Hx   . Stroke Neg Hx   . Colon cancer Neg Hx     ***  Prior to Admission medications   Medication Sig Start Date End Date Taking? Authorizing Provider  acetaminophen (TYLENOL) 500 MG tablet Take 2 tablets (1,000 mg total) by mouth 3 (three) times daily as needed for mild pain or moderate pain. 06/06/20   Joaquim Nam, MD  allopurinol (ZYLOPRIM) 300 MG tablet TAKE 1 TABLET DAILY 05/28/22   Joaquim Nam, MD  aspirin 81 MG tablet Take 81 mg by mouth daily.    [provider]  atorvastatin (LIPITOR) 40 MG tablet TAKE 1 TABLET DAILY 05/28/22   Joaquim Nam, MD  carvedilol (COREG CR) 20 MG 24 hr capsule TAKE 1 CAPSULE DAILY 11/08/22   Joaquim Nam, MD  lisinopril (ZESTRIL) 20 MG tablet TAKE 1 TABLET DAILY 05/28/22   Joaquim Nam, MD  melatonin 3 MG TABS tablet Take 1 tablet (3 mg total) by mouth at bedtime. 05/17/20   Love, Evlyn Kanner, PA-C  omeprazole (PRILOSEC) 40 MG capsule TAKE 1 CAPSULE DAILY AS NEEDED 01/14/22   Joaquim Nam, MD  primidone (MYSOLINE) 50 MG tablet TAKE ONE-HALF (1/2) TABLET DAILY 05/28/22   Joaquim Nam, MD  senna-docusate (SENOKOT-S) 8.6-50 MG tablet Take 1-2 tablets by mouth daily. 06/22/21   Joaquim Nam, MD  silodosin (RAPAFLO) 8 MG CAPS capsule Take 1 capsule (8 mg total) by mouth at bedtime. 10/30/22   McKenzie, Mardene Celeste, MD  silodosin (RAPAFLO) 8 MG CAPS capsule Take 1 capsule (8 mg total) by mouth daily with breakfast. 10/30/22   McKenzie, Mardene Celeste, MD  sulfamethoxazole-trimethoprim (BACTRIM DS) 800-160 MG tablet Take 1 tablet by mouth every  12 (twelve) hours. 12/24/22   Malen Gauze, MD  traZODone (DESYREL) 50 MG tablet TAKE 1 TABLET AT BEDTIME 11/08/22   Joaquim Nam, MD  vitamin B-12 (CYANOCOBALAMIN) 1000 MCG tablet Take 1 tablet (1,000 mcg total) by mouth daily. 01/20/20   Joaquim Nam, MD    Physical Exam: BP (!) 149/80 (BP Location: Right Arm)   Pulse 62   Temp 97.6 F (36.4 C) (Oral)   Resp 20   Ht  (1.803 m)   Wt 79.8 kg   SpO2 99%   BMI 24.55 kg/m   General: 89 y.o. year-old male well developed well nourished in no acute distress.  Alert and oriented x3. HEENT: NCAT, EOMI Neck: Supple, trachea medial Cardiovascular: Regular rate and rhythm with no rubs or gallops.  No thyromegaly or  JVD noted.  No lower extremity edema. 2/4 pulses in all 4 extremities. Respiratory: Clear to auscultation with no wheezes or rales. Good inspiratory effort. Abdomen: Soft, nontender nondistended with normal bowel sounds x4 quadrants. Muskuloskeletal: No cyanosis, clubbing or edema noted bilaterally Neuro: CN II-XII intact, strength 5/5 x 4, sensation, reflexes intact Skin: No ulcerative lesions noted or rashes Psychiatry: Mood is appropriate for condition and setting          Labs on Admission:  Basic Metabolic Panel: Recent Labs  Lab 12/27/22 1530  NA 131*  K 4.5  CL 100  CO2 22  GLUCOSE 104*  BUN 36*  CREATININE 2.32*  CALCIUM 9.1   Liver Function Tests: No results for input(s): "AST", "ALT", "ALKPHOS", "BILITOT", "PROT", "ALBUMIN" in the last 168 hours. No results for input(s): "LIPASE", "AMYLASE" in the last 168 hours. No results for input(s): "AMMONIA" in the last 168 hours. CBC: Recent Labs  Lab 12/27/22 1530  WBC 6.9  NEUTROABS 4.7  HGB 12.1*  HCT 35.9*  MCV 99.2  PLT 200   Cardiac Enzymes: No results for input(s): "CKTOTAL", "CKMB", "CKMBINDEX", "TROPONINI" in the last 168 hours.  BNP (last 3 results) No results for input(s): "BNP" in the last 8760 hours.  ProBNP (last 3  results) No results for input(s): "PROBNP" in the last 8760 hours.  CBG: No results for input(s): "GLUCAP" in the last 168 hours.  Radiological Exams on Admission: No results found.  EKG: I independently viewed the EKG done and my findings are as followed: Sinus bradycardia at a rate of 54 bpm with RBBB and first-degree AV block (EKG similar to 1 done on 08/13/2022)  Assessment/Plan Present on Admission: . AKI (acute kidney injury)  Principal Problem:   AKI (acute kidney injury)  UTI POA Urine culture done on 08/13/2022 was positive for Staphylococcus epidermidis which was sensitive to Oxacillin Continue IV ceftriaxone  Urine culture pending  Acute kidney injury superimposed on CKD BUN/creatinine 36/2.32 (baseline creatinine at 1.4-1.6) Continue gentle hydration Renally adjust medications, avoid nephrotoxic agents/dehydration/hypotension  Hyponatremia Na 131 Continue gentle hydration Continue to monitor sodium levels  Essential hypertension  Mixed hyperlipidemia CAD Chronic back pain   DVT prophylaxis: ***   Code Status: ***   Family Communication: ***   Disposition Plan: ***   Consults called: ***   Admission status: ***     Frankey Shown MD [facility] Pager [phone]  If 7PM-7AM, please contact night-coverage [email or web address] Password [facility]  12/27/2022, 10:47 PM     Review of Systems: {ROS_Text:26778} Past Medical History:  Diagnosis Date  . Acute renal failure 12/06/2013  . Back pain    occasionally  . Benign essential tremor 2005   takes Primidone daily  . Choledocholithiasis 12/06/2013  . Constipation    takes OTC stool softener  . Coronary artery disease 2007   s/p CABG, DR Eden Emms  . GERD (gastroesophageal reflux disease)    takes Nexium daily  . Gout 1995   "~ once/yr" (03/17/2014)  . History of colon polyps   . Hyperlipidemia 12/2005   takes Atorvastatin daily  . Hypertension 1995   takes Lisinopril and Coreg daily  .  possible HCAP (healthcare-associated pneumonia) 12/09/2013  . Prostate cancer 2007   S/P treatment, followed by Urology prev  . Right bundle branch block (RBBB)   . Stenosis of cervical spine   . Type II diabetes mellitus    no meds;diet and exercise controlled   . Wears dentures   . Wears glasses  Past Surgical History:  Procedure Laterality Date  . Adenosine myoview  01/24/2006   Ischemia by EKG, Cath  . CATARACT EXTRACTION W/ INTRAOCULAR LENS IMPLANT Left 10/2013  . CHOLECYSTECTOMY N/A 12/06/2013   Procedure: Diagnostic Laparoscopy for  drainage Intrabdominal abcess;  Surgeon: Emelia Loron, MD;  Location: [facility] OR;  Service: General;  Laterality: N/A;  . CHOLECYSTECTOMY N/A 03/17/2014   Procedure: LAPAROSCOPIC CHOLECYSTECTOMY ;  Surgeon: Emelia Loron, MD;  Location: [facility] OR;  Service: General;  Laterality: N/A;  . COLONOSCOPY    . CORONARY ARTERY BYPASS GRAFT  2007   CABGX 4  . CYSTOSCOPY  02/15/2004   Mod BPH, moder tribec ?  Marland Kitchen ERCP N/A 12/06/2013   Procedure: ENDOSCOPIC RETROGRADE CHOLANGIOPANCREATOGRAPHY (ERCP);  Surgeon: Rachael Fee, MD;  Location: [facility] OR;  Service: Endoscopy;  Laterality: N/A;  . LAPAROSCOPIC CHOLECYSTECTOMY  03/17/2014  . LUMBAR FUSION  02/2012   lumbar spine for spinal stenosis  . POSTERIOR CERVICAL FUSION/FORAMINOTOMY N/A 05/01/2020   Procedure: Posterior cervical fusion with lateral mass fixation - Cervical three - Cervical six, laminectomy Cervical three-six;  Surgeon: Tia Alert, MD;  Location: [facility] OR;  Service: Neurosurgery;  Laterality: N/A;  . PROSTATE CRYOABLATION  09/18/2005   prostate CA   Social History:  reports that he has never smoked. He has never used smokeless tobacco. He reports that he does not drink alcohol and does not use drugs.  No Known Allergies  Family History  Problem Relation Age of Onset  . Heart disease Mother   . Cancer Mother   . Cancer Sister   . Diabetes Sister   . Cancer Brother        prostate  . Prostate  cancer Brother   . Hypothyroidism Brother   . Depression Neg Hx   . Alcohol abuse Neg Hx   . Drug abuse Neg Hx   . Stroke Neg Hx   . Colon cancer Neg Hx     Prior to Admission medications   Medication Sig Start Date End Date Taking? Authorizing Provider  acetaminophen (TYLENOL) 500 MG tablet Take 2 tablets (1,000 mg total) by mouth 3 (three) times daily as needed for mild pain or moderate pain. 06/06/20   Joaquim Nam, MD  allopurinol (ZYLOPRIM) 300 MG tablet TAKE 1 TABLET DAILY 05/28/22   Joaquim Nam, MD  aspirin 81 MG tablet Take 81 mg by mouth daily.    [provider]  atorvastatin (LIPITOR) 40 MG tablet TAKE 1 TABLET DAILY 05/28/22   Joaquim Nam, MD  carvedilol (COREG CR) 20 MG 24 hr capsule TAKE 1 CAPSULE DAILY 11/08/22   Joaquim Nam, MD  lisinopril (ZESTRIL) 20 MG tablet TAKE 1 TABLET DAILY 05/28/22   Joaquim Nam, MD  melatonin 3 MG TABS tablet Take 1 tablet (3 mg total) by mouth at bedtime. 05/17/20   Love, Evlyn Kanner, PA-C  omeprazole (PRILOSEC) 40 MG capsule TAKE 1 CAPSULE DAILY AS NEEDED 01/14/22   Joaquim Nam, MD  primidone (MYSOLINE) 50 MG tablet TAKE ONE-HALF (1/2) TABLET DAILY 05/28/22   Joaquim Nam, MD  senna-docusate (SENOKOT-S) 8.6-50 MG tablet Take 1-2 tablets by mouth daily. 06/22/21   Joaquim Nam, MD  silodosin (RAPAFLO) 8 MG CAPS capsule Take 1 capsule (8 mg total) by mouth at bedtime. 10/30/22   McKenzie, Mardene Celeste, MD  silodosin (RAPAFLO) 8 MG CAPS capsule Take 1 capsule (8 mg total) by mouth daily with breakfast. 10/30/22   McKenzie, Mardene Celeste,  MD  sulfamethoxazole-trimethoprim (BACTRIM DS) 800-160 MG tablet Take 1 tablet by mouth every 12 (twelve) hours. 12/24/22   Malen Gauze, MD  traZODone (DESYREL) 50 MG tablet TAKE 1 TABLET AT BEDTIME 11/08/22   Joaquim Nam, MD  vitamin B-12 (CYANOCOBALAMIN) 1000 MCG tablet Take 1 tablet (1,000 mcg total) by mouth daily. 01/20/20   Joaquim Nam, MD    Physical Exam: Vitals:    12/27/22 1450 12/27/22 1451 12/27/22 1945 12/27/22 1959  BP:  (!) 122/58 (!) 149/75 (!) 149/80  Pulse:  (!) 56 60 62  Resp:  18 16 20   Temp:  98 F (36.7 C) 97.6 F (36.4 C) 97.6 F (36.4 C)  TempSrc:  Oral Oral Oral  SpO2:  97% 100% 99%  Weight: 79.8 kg     Height: 5\' 11"  (1.803 m)      *** Data Reviewed: {Tip this will not be part of the note when signed- Document your independent interpretation of telemetry tracing, EKG, lab, Radiology test or any other diagnostic tests. Add any new diagnostic test ordered today. (Optional):26781} {Results:26384}  Assessment and Plan: No notes have been filed under this hospital service. Service: Hospitalist     Advance Care Planning:   Code Status: Prior ***  Consults: ***  Family Communication: ***  Severity of Illness: {Observation/Inpatient:21159}  Author: Frankey Shown, DO 12/27/2022 8:37 PM  For on call review [email or web address].

## 2022-12-27 NOTE — Telephone Encounter (Signed)
Daughter called requesting culture results  She states patient was running a low fever of 99.0, no fever as of today.  Patient still taking bactrim and is not sure if his symptoms are improving.  He still reports pressure in his lower abdomen and frequency.  Culture results still pending, she is aware we will reach out with MD recommendations once culture results.  She did mention going to the ER if his symptoms worsen.

## 2022-12-27 NOTE — H&P (Incomplete)
History and Physical    Patient: [name] [name]:[id number] DOB: July 29, 1929 DOA: 12/27/2022 DOS: the patient was seen and examined on 12/28/2022 PCP: [name] Nam, MD  Patient coming from: Home  Chief Complaint:  Chief Complaint  Patient presents with   Urinary Retention   HPI: [name] [name] is a 89 y.o. male with medical history significant of chronic back pain, hypertension, hyperlipidemia CAD, history of prostate cancer who presents to the emergency department via EMS due to several days of onset of difficulty urination, urgency, frequency and inability to completely empty his bladder.  He consulted with his urologist on Monday (4/15) and was started on Bactrim, but patient continued to have burning sensation on urination and urinary frequency, he complained of weakness, low back and abdominal pain.  Apparently, patient has also been retaining urine, so he presents to the emergency department for further evaluation and management.  ED Course:  In the emergency department, BP was 122/58, pulse 56 bpm, other vital signs were within normal range.  Workup in the ED showed normocytic anemia, platelets 200.  BMP showed sodium of 131, potassium 4.5, chloride 100, bicarb 22, blood glucose 104, BUN/creatinine 36/2.32 (baseline creatinine at 1.4-1.6), lactic acid was normal, urinalysis was positive for proteinuria and small leukocytes. Patient was treated with Keflex 500 mg p.o. x 1, this was followed with IV ceftriaxone, IV hydration was provided.  Hospitalist was asked to admit patient for further evaluation and management.  Review of Systems: Review of systems as noted in the HPI. All other systems reviewed and are negative.   Past Medical History:  Diagnosis Date   Acute renal failure 12/06/2013   Back pain    occasionally   Benign essential tremor 2005   takes Primidone daily   Choledocholithiasis 12/06/2013   Constipation    takes OTC stool softener   Coronary artery  disease 2007   s/p CABG, DR Eden Emms   GERD (gastroesophageal reflux disease)    takes Nexium daily   Gout 1995   "~ once/yr" (03/17/2014)   History of colon polyps    Hyperlipidemia 12/2005   takes Atorvastatin daily   Hypertension 1995   takes Lisinopril and Coreg daily   possible HCAP (healthcare-associated pneumonia) 12/09/2013   Prostate cancer 2007   S/P treatment, followed by Urology prev   Right bundle branch block (RBBB)    Stenosis of cervical spine    Type II diabetes mellitus    no meds;diet and exercise controlled    Wears dentures    Wears glasses    Past Surgical History:  Procedure Laterality Date   Adenosine myoview  01/24/2006   Ischemia by EKG, Cath   CATARACT EXTRACTION W/ INTRAOCULAR LENS IMPLANT Left 10/2013   CHOLECYSTECTOMY N/A 12/06/2013   Procedure: Diagnostic Laparoscopy for  drainage Intrabdominal abcess;  Surgeon: Emelia Loron, MD;  Location: [facility] OR;  Service: General;  Laterality: N/A;   CHOLECYSTECTOMY N/A 03/17/2014   Procedure: LAPAROSCOPIC CHOLECYSTECTOMY ;  Surgeon: Emelia Loron, MD;  Location: [facility] OR;  Service: General;  Laterality: N/A;   COLONOSCOPY     CORONARY ARTERY BYPASS GRAFT  2007   CABGX 4   CYSTOSCOPY  02/15/2004   Mod BPH, moder tribec ?   ERCP N/A 12/06/2013   Procedure: ENDOSCOPIC RETROGRADE CHOLANGIOPANCREATOGRAPHY (ERCP);  Surgeon: Rachael Fee, MD;  Location: [facility] OR;  Service: Endoscopy;  Laterality: N/A;   LAPAROSCOPIC CHOLECYSTECTOMY  03/17/2014   LUMBAR FUSION  02/2012   lumbar spine for spinal  stenosis   POSTERIOR CERVICAL FUSION/FORAMINOTOMY N/A 05/01/2020   Procedure: Posterior cervical fusion with lateral mass fixation - Cervical three - Cervical six, laminectomy Cervical three-six;  Surgeon: Tia Alert, MD;  Location: [facility] OR;  Service: Neurosurgery;  Laterality: N/A;   PROSTATE CRYOABLATION  09/18/2005   prostate CA    Social History:  reports that he has never smoked. He has never used smokeless tobacco. He  reports that he does not drink alcohol and does not use drugs.   No Known Allergies  Family History  Problem Relation Age of Onset   Heart disease Mother    Cancer Mother    Cancer Sister    Diabetes Sister    Cancer Brother        prostate   Prostate cancer Brother    Hypothyroidism Brother    Depression Neg Hx    Alcohol abuse Neg Hx    Drug abuse Neg Hx    Stroke Neg Hx    Colon cancer Neg Hx      Prior to Admission medications   Medication Sig Start Date End Date Taking? Authorizing Provider  acetaminophen (TYLENOL) 500 MG tablet Take 2 tablets (1,000 mg total) by mouth 3 (three) times daily as needed for mild pain or moderate pain. 06/06/20   [name] Nam, MD  allopurinol (ZYLOPRIM) 300 MG tablet TAKE 1 TABLET DAILY 05/28/22   [name] Nam, MD  aspirin 81 MG tablet Take 81 mg by mouth daily.    [provider]  atorvastatin (LIPITOR) 40 MG tablet TAKE 1 TABLET DAILY 05/28/22   [name] Nam, MD  carvedilol (COREG CR) 20 MG 24 hr capsule TAKE 1 CAPSULE DAILY 11/08/22   [name] Nam, MD  lisinopril (ZESTRIL) 20 MG tablet TAKE 1 TABLET DAILY 05/28/22   [name] Nam, MD  melatonin 3 MG TABS tablet Take 1 tablet (3 mg total) by mouth at bedtime. 05/17/20   Love, Evlyn Kanner, PA-C  omeprazole (PRILOSEC) 40 MG capsule TAKE 1 CAPSULE DAILY AS NEEDED 01/14/22   [name] Nam, MD  primidone (MYSOLINE) 50 MG tablet TAKE ONE-HALF (1/2) TABLET DAILY 05/28/22   [name] Nam, MD  senna-docusate (SENOKOT-S) 8.6-50 MG tablet Take 1-2 tablets by mouth daily. 06/22/21   [name] Nam, MD  silodosin (RAPAFLO) 8 MG CAPS capsule Take 1 capsule (8 mg total) by mouth at bedtime. 10/30/22   McKenzie, Mardene Celeste, MD  silodosin (RAPAFLO) 8 MG CAPS capsule Take 1 capsule (8 mg total) by mouth daily with breakfast. 10/30/22   McKenzie, Mardene Celeste, MD  sulfamethoxazole-trimethoprim (BACTRIM DS) 800-160 MG tablet Take 1 tablet by mouth every 12 (twelve) hours. 12/24/22   Malen Gauze, MD  traZODone (DESYREL) 50 MG tablet TAKE 1 TABLET AT BEDTIME 11/08/22   [name] Nam, MD  vitamin B-12 (CYANOCOBALAMIN) 1000 MCG tablet Take 1 tablet (1,000 mcg total) by mouth daily. 01/20/20   [name] Nam, MD    Physical Exam: BP (!) 149/80 (BP Location: Right Arm)   Pulse 62   Temp 97.6 F (36.4 C) (Oral)   Resp 20   Ht 5\' 11"  (1.803 m)   Wt 79.8 kg   SpO2 99%   BMI 24.55 kg/m   General: 89 y.o. year-old male well developed well nourished in no acute distress.  Alert and oriented x3. HEENT: NCAT, EOMI Neck: Supple, trachea medial Cardiovascular: Regular rate and rhythm with no rubs or gallops.  No thyromegaly or JVD  noted.  No lower extremity edema. 2/4 pulses in all 4 extremities. Respiratory: Clear to auscultation with no wheezes or rales. Good inspiratory effort. Abdomen: Soft, nontender nondistended with normal bowel sounds x4 quadrants. Muskuloskeletal: No cyanosis, clubbing or edema noted bilaterally Neuro: CN II-XII intact, strength 5/5 x 4, sensation, reflexes intact Skin: No ulcerative lesions noted or rashes Psychiatry: Mood is appropriate for condition and setting          Labs on Admission:  Basic Metabolic Panel: Recent Labs  Lab 12/27/22 1530  NA 131*  K 4.5  CL 100  CO2 22  GLUCOSE 104*  BUN 36*  CREATININE 2.32*  CALCIUM 9.1   Liver Function Tests: No results for input(s): "AST", "ALT", "ALKPHOS", "BILITOT", "PROT", "ALBUMIN" in the last 168 hours. No results for input(s): "LIPASE", "AMYLASE" in the last 168 hours. No results for input(s): "AMMONIA" in the last 168 hours. CBC: Recent Labs  Lab 12/27/22 1530  WBC 6.9  NEUTROABS 4.7  HGB 12.1*  HCT 35.9*  MCV 99.2  PLT 200   Cardiac Enzymes: No results for input(s): "CKTOTAL", "CKMB", "CKMBINDEX", "TROPONINI" in the last 168 hours.  BNP (last 3 results) No results for input(s): "BNP" in the last 8760 hours.  ProBNP (last 3 results) No results for input(s): "PROBNP"  in the last 8760 hours.  CBG: No results for input(s): "GLUCAP" in the last 168 hours.  Radiological Exams on Admission: No results found.  EKG: I independently viewed the EKG done and my findings are as followed: Sinus bradycardia at a rate of 54 bpm with RBBB and first-degree AV block (EKG similar to 1 done on 08/13/2022)  Assessment/Plan Present on Admission:  AKI (acute kidney injury)  UTI (urinary tract infection)  Hyponatremia  Essential hypertension  Acute on chronic urinary retention  Mixed hyperlipidemia  CAD (coronary artery disease)  Principal Problem:   UTI (urinary tract infection) Active Problems:   Mixed hyperlipidemia   Essential hypertension   CAD (coronary artery disease)   AKI (acute kidney injury)   Hyponatremia   Acute on chronic urinary retention   BPH (benign prostatic hyperplasia)  UTI POA Urine culture done on 08/13/2022 was positive for Staphylococcus epidermidis which was sensitive to Oxacillin Continue IV ceftriaxone  Urine culture pending  Acute kidney injury superimposed on CKD 4 BUN/creatinine 36/2.32 (baseline creatinine at 1.4-1.6) Continue gentle hydration Renally adjust medications, avoid nephrotoxic agents/dehydration/hypotension  Hyponatremia Na 131 Continue gentle hydration Continue to monitor sodium levels  Essential hypertension Continue Coreg  Mixed hyperlipidemia Continue Lipitor  CAD Continue Coreg, Lipitor  Chronic back pain Continue Tylenol as needed  BPH with urinary obstruction Urinary retention Foley catheter was placed Continue Flomax Patient follows with Dr. Ronne Binning (urologist)   DVT prophylaxis: Lovenox   Advance Care Planning:   Code Status: Full Code   Consults: None  Family Communication: None at bedside  Severity of Illness: The appropriate patient status for this patient is INPATIENT. Inpatient status is judged to be reasonable and necessary in order to provide the required intensity of  service to ensure the patient's safety. The patient's presenting symptoms, physical exam findings, and initial radiographic and laboratory data in the context of their chronic comorbidities is felt to place them at high risk for further clinical deterioration. Furthermore, it is not anticipated that the patient will be medically stable for discharge from the hospital within 2 midnights of admission.   * I certify that at the point of admission it is my clinical judgment that  the patient will require inpatient hospital care spanning beyond 2 midnights from the point of admission due to high intensity of service, high risk for further deterioration and high frequency of surveillance required.*  Author: Frankey Shown, DO 12/28/2022 12:31 AM  For on call review [email or web address].

## 2022-12-27 NOTE — ED Triage Notes (Signed)
Pt was diagnosed with an UTI Monday this week and was started on sulfameth/trimethoprim 800/160mg   Pt's daughter states pt continues to have urinary frequency and burning with urination  Pt states he feels weaker  Pt c/o lower back pain and lower abdominal pain  Daughter states pt has been retaining his urine and states the doctor office was supposed to be calling in regards to a urine culture but she has not heard anything yet

## 2022-12-27 NOTE — ED Notes (Signed)
Introduced myself to patient. Daughter went home to get his belongings. Patient is currently eating.

## 2022-12-28 DIAGNOSIS — N400 Enlarged prostate without lower urinary tract symptoms: Secondary | ICD-10-CM | POA: Insufficient documentation

## 2022-12-28 DIAGNOSIS — R627 Adult failure to thrive: Secondary | ICD-10-CM | POA: Diagnosis not present

## 2022-12-28 DIAGNOSIS — N1832 Chronic kidney disease, stage 3b: Secondary | ICD-10-CM | POA: Diagnosis not present

## 2022-12-28 DIAGNOSIS — N179 Acute kidney failure, unspecified: Secondary | ICD-10-CM | POA: Diagnosis not present

## 2022-12-28 DIAGNOSIS — N3000 Acute cystitis without hematuria: Secondary | ICD-10-CM | POA: Diagnosis not present

## 2022-12-28 LAB — COMPREHENSIVE METABOLIC PANEL
ALT: 18 U/L (ref 0–44)
AST: 19 U/L (ref 15–41)
Albumin: 2.5 g/dL — ABNORMAL LOW (ref 3.5–5.0)
Alkaline Phosphatase: 55 U/L (ref 38–126)
Anion gap: 7 (ref 5–15)
BUN: 32 mg/dL — ABNORMAL HIGH (ref 8–23)
CO2: 22 mmol/L (ref 22–32)
Calcium: 8.6 mg/dL — ABNORMAL LOW (ref 8.9–10.3)
Chloride: 105 mmol/L (ref 98–111)
Creatinine, Ser: 2.08 mg/dL — ABNORMAL HIGH (ref 0.61–1.24)
GFR, Estimated: 30 mL/min — ABNORMAL LOW (ref >60)
Glucose, Bld: 94 mg/dL (ref 70–99)
Potassium: 5.1 mmol/L (ref 3.5–5.1)
Sodium: 134 mmol/L — ABNORMAL LOW (ref 135–145)
Total Bilirubin: 0.6 mg/dL (ref 0.3–1.2)
Total Protein: 5.8 g/dL — ABNORMAL LOW (ref 6.5–8.1)

## 2022-12-28 LAB — URINE CULTURE

## 2022-12-28 LAB — CBC
HCT: 31.6 % — ABNORMAL LOW (ref 39.0–52.0)
Hemoglobin: 10.7 g/dL — ABNORMAL LOW (ref 13.0–17.0)
MCH: 33.2 pg (ref 26.0–34.0)
MCHC: 33.9 g/dL (ref 30.0–36.0)
MCV: 98.1 fL (ref 80.0–100.0)
Platelets: 180 10*3/uL (ref 150–400)
RBC: 3.22 MIL/uL — ABNORMAL LOW (ref 4.22–5.81)
RDW: 14 % (ref 11.5–15.5)
WBC: 6.7 10*3/uL (ref 4.0–10.5)
nRBC: 0 % (ref 0.0–0.2)

## 2022-12-28 LAB — FOLATE: Folate: 5.5 ng/mL — ABNORMAL LOW (ref >5.9)

## 2022-12-28 LAB — VITAMIN B12: Vitamin B-12: 478 pg/mL (ref 180–914)

## 2022-12-28 LAB — TSH: TSH: 2.187 u[IU]/mL (ref 0.350–4.500)

## 2022-12-28 LAB — MAGNESIUM: Magnesium: 1.9 mg/dL (ref 1.7–2.4)

## 2022-12-28 LAB — CK: Total CK: 71 U/L (ref 49–397)

## 2022-12-28 LAB — PHOSPHORUS: Phosphorus: 2.4 mg/dL — ABNORMAL LOW (ref 2.5–4.6)

## 2022-12-28 LAB — T4, FREE: Free T4: 0.98 ng/dL (ref 0.61–1.12)

## 2022-12-28 MED ORDER — FOLIC ACID 1 MG PO TABS
1.0000 mg | ORAL_TABLET | Freq: Every day | ORAL | Status: DC
  Administered 2022-12-28 – 2022-12-30 (×3): 1 mg via ORAL
  Filled 2022-12-28 (×3): qty 1

## 2022-12-28 MED ORDER — TAMSULOSIN HCL 0.4 MG PO CAPS
0.4000 mg | ORAL_CAPSULE | Freq: Every day | ORAL | Status: DC
  Administered 2022-12-28 – 2022-12-29 (×2): 0.4 mg via ORAL
  Filled 2022-12-28 (×2): qty 1

## 2022-12-28 MED ORDER — CARVEDILOL PHOSPHATE ER 20 MG PO CP24
20.0000 mg | ORAL_CAPSULE | Freq: Every day | ORAL | Status: DC
  Filled 2022-12-28 (×3): qty 1

## 2022-12-28 MED ORDER — PANTOPRAZOLE SODIUM 40 MG PO TBEC
80.0000 mg | DELAYED_RELEASE_TABLET | Freq: Every day | ORAL | Status: DC
  Administered 2022-12-28 – 2022-12-30 (×3): 80 mg via ORAL
  Filled 2022-12-28 (×3): qty 2

## 2022-12-28 MED ORDER — ATORVASTATIN CALCIUM 40 MG PO TABS
40.0000 mg | ORAL_TABLET | Freq: Every day | ORAL | Status: DC
  Administered 2022-12-28 – 2022-12-30 (×3): 40 mg via ORAL
  Filled 2022-12-28 (×3): qty 1

## 2022-12-28 MED ORDER — CARVEDILOL 3.125 MG PO TABS
6.2500 mg | ORAL_TABLET | Freq: Two times a day (BID) | ORAL | Status: DC
  Administered 2022-12-28 – 2022-12-30 (×5): 6.25 mg via ORAL
  Filled 2022-12-28 (×5): qty 2

## 2022-12-28 NOTE — TOC Initial Note (Signed)
Transition of Care [facility]) - Initial/Assessment Note    Patient Details  Name: [name] [name] MRN: [id number] Date of Birth: [birth year]  Transition of Care (TOC) CM/SW Contact:    Catalina Gravel, LCSW Phone Number: 12/28/2022, 5:37 PM  Clinical Narrative:                  .. Transition of Care [facility]) Screening Note   Patient Details  Name: [name] [name] Date of Birth: 03/03/1929   Transition of Care (TOC) CM/SW Contact:    Catalina Gravel, LCSW Phone Number: 12/28/2022, 5:37 PM    Transition of Care [facility]) has reviewed patient and no TOC needs have been identified at this time. We will continue to monitor patient advancement through interdisciplinary progression rounds. If new patient transition needs arise, please place a TOC consult.   Addendum [facility] ECR: [phone]   Barriers to Discharge: Continued Medical Work up   Patient Goals and CMS Choice            Expected Discharge Plan and Services                                              Prior Living Arrangements/Services                       Activities of Daily Living Home Assistive Devices/Equipment: Environmental consultant (specify type), Dentures (specify type), Shower chair without back ADL Screening (condition at time of admission) Patient's cognitive ability adequate to safely complete daily activities?: Yes Is the patient deaf or have difficulty hearing?: Yes Does the patient have difficulty seeing, even when wearing glasses/contacts?: Yes Does the patient have difficulty concentrating, remembering, or making decisions?: No Patient able to express need for assistance with ADLs?: Yes Does the patient have difficulty dressing or bathing?: Yes Independently performs ADLs?: No Communication: Independent Dressing (OT): Independent Grooming: Needs assistance Is this a change from baseline?: Pre-admission baseline Feeding: Independent Bathing: Needs assistance Is this a  change from baseline?: Pre-admission baseline Toileting: Needs assistance Is this a change from baseline?: Pre-admission baseline In/Out Bed: Independent Walks in Home: Independent Does the patient have difficulty walking or climbing stairs?: Yes Weakness of Legs: Both Weakness of Arms/Hands: Both  Permission Sought/Granted                  Emotional Assessment              Admission diagnosis:  Urinary retention [R33.9] Acute cystitis without hematuria [N30.00] AKI (acute kidney injury) [N17.9] Patient Active Problem List   Diagnosis Date Noted   BPH (benign prostatic hyperplasia) 12/28/2022   Failure to thrive in adult 12/28/2022   Skin lesion 08/21/2022   History of prostate cancer 10/10/2021   Acute on chronic urinary retention 10/10/2021   Cystitis 10/10/2021   DNR (do not resuscitate) 06/25/2021   Constipation 07/10/2020   Sleep disturbance 07/10/2020   Abnormality of gait 07/10/2020   Insomnia 06/12/2020   Acute renal failure superimposed on stage 3b chronic kidney disease    Hyponatremia    Hypoalbuminemia due to protein-calorie malnutrition    Transaminitis    Acute on chronic anemia    Stenosis of cervical spine with myelopathy 05/04/2020   S/P cervical spinal fusion 05/01/2020   History of agent Orange exposure 04/05/2020   Paresthesia 01/20/2020   Health care  maintenance 12/28/2017   GERD (gastroesophageal reflux disease) 12/28/2017   Medicare annual wellness visit, subsequent 10/30/2014   Advance care planning 10/30/2014   UTI (urinary tract infection) 04/19/2014   Right bundle branch block (RBBB)    Wheezing 12/09/2013   CKD (chronic kidney disease), stage III 12/06/2013   Back pain 10/20/2011   Hypothyroidism 03/23/2009   UNSPECIFIED VITAMIN D DEFICIENCY 03/23/2009   Tremor 12/18/2006   CAD (coronary artery disease) 12/18/2006   Mixed hyperlipidemia 12/08/2005   History of diabetes mellitus 02/07/2005   Gout 09/09/1993   Essential  hypertension 09/09/1993   PCP:  Joaquim Nam, MD Pharmacy:   [facility] - [place], [place] - [phone], [address] [place] [address] Phone: [phone] Fax: [phone]  [id number] - [place], [place] - [address] AT [facility] [address] [place] [address] Phone: [phone] Fax: [phone]  [facility] - [place], [place] - [address] [place] [address] Phone: [phone] Fax: [phone]     Social Determinants of Health (SDOH) Social History: SDOH Screenings   Food Insecurity: No Food Insecurity (12/27/2022)  Housing: Low Risk  (12/27/2022)  Transportation Needs: No Transportation Needs (12/27/2022)  Utilities: Not At Risk (12/27/2022)  Depression (PHQ2-9): Low Risk  (07/30/2021)  Tobacco Use: Low Risk  (12/27/2022)   SDOH Interventions:     Readmission Risk Interventions     No data to display

## 2022-12-28 NOTE — Progress Notes (Signed)
PT Cancellation Note  Patient Details Name: [name] [name] MRN: [id number] DOB: 09/09/1929 Micah Flesher to evaluate patient.  Pt states that he just returned with nursing from a walk.  States that he walked around the nurses station and down the hall with no issues.  No skilled Pt warranted.  Will sign off.   Cancelled Treatment:    Reason Eval/Treat Not Completed: PT screened, no needs identified, will sign off  Virgina Organ, [place] CLT [phone]  12/28/2022, 12:25 PM

## 2022-12-28 NOTE — Progress Notes (Signed)
PROGRESS NOTE  [name] [name] XLK:[id number] DOB: [birth year] DOA: 12/27/2022 PCP: [name] Nam, MD  Brief History:  89 year old male with a history of chronic back pain, hypertension, hyperlipidemia, coronary disease, prostate cancer, CKD stage III, BPH, urinary retention, essential tremor presenting with approximate 1 week history of generalized weakness, urinary frequency and urgency, and dysuria.  The patient contacted his urology office, Dr. Ronne [name], to discuss the above symptoms on 12/23/2022.  Urine sample was taken to the office on 12/24/2022, and the patient was prescribed empiric Bactrim.  The patient has been compliant with the medication.  His daughter called the urology office on 12/27/2022 to check on the urine culture results.  Unfortunately had not yet resulted, but the UA showed >30WBC.  Unfortunately, the patient continued to have urinary frequency, dysuria, and worsening generalized weakness.  The patient was noted to have a low-grade temperature at home of 99.0 F.  As result, the patient was brought to emergency department for further evaluation and treatment.  The patient denies any headache, chest pain, shortness breath, cough, hemoptysis, nausea, vomiting, diarrhea, hematochezia, melena.  At baseline, the patient is able to ambulate with a walker.  He states that his oral intake is poor.  He complains of some periumbilical and suprapubic discomfort. In the emergency department, a Foley catheter was inserted secondary to urinary retention with return of 600 cc of urine. He was afebrile and hemodynamically stable with oxygen saturation 100% room air. WBC 6.9, hemoglobin 12.1, platelets 20,000.  Sodium 131, potassium 4.5, bicarb 22, serum creatinine 2.32.  12/27/2022 UA 6-10 wbc.  He was started on ceftriaxone and IV fluids.   Assessment/Plan: Generalized weakness/failure to thrive -Multifactorial including UTI, acute on chronic renal failure, and  hyponatremia -Continue IV fluids -Continue empiric ceftriaxone -B12 -Folic acid -TSH -PT evaluation  Acute on chronic renal failure--CKD stage IIIb -Baseline creatinine 1.4-1.6 -Patient presented with serum creatinine 2.32 -Second volume depletion -Continue IV fluids  UTI -Although the patient did not have significant pyuria, he had been on Bactrim prior to admission which may also compromise his culture data -Continue empiric ceftriaxone and  Acute urinary retention -Foley catheter inserted 12/27/2022 with return of 600 cc urine -Continue tamsulosin  Hyponatremia -Secondary to line depletion, poor solute intake, and renal failure -Continue IV fluids -Monitor BMP  Essential hypertension -Continue carvedilol   Mixed hyperlipidemia -Continue statin       Family Communication:   left VM for daughter  Consultants:  none  Code Status:  FULL  DVT Prophylaxis: [place] Lovenox   Procedures: As Listed in Progress Note Above  Antibiotics: None       Subjective: Patient states that he still feels generalized weakness.  He denies fevers, chills, headache, chest pain, shortness breath, nausea, vomiting, diarrhea.  He still has some periumbilical discomfort.  He denies any hematochezia or melena.  Objective: Vitals:   12/27/22 1959 12/27/22 2329 12/28/22 0043 12/28/22 0445  BP: (!) 149/80  134/69 121/66  Pulse: 62  (!) 56 (!) 58  Resp: Temp: 97.6 F (36.4 C)  98.2 F (36.8 C) 98.3 F (36.8 C)  TempSrc: Oral  Oral Oral  SpO2: 99% 99% 98% 100%  Weight:      Height:        Intake/Output Summary (Last 24 hours) at 12/28/2022 0703 Last data [name] 0646 Gross per 24 hour  Intake 687.72 ml  Output 1650 ml  Net -962.28 ml   Weight change:  Exam:  General:  Pt is alert, follows commands appropriately, not in acute distress HEENT: No icterus, No thrush, No neck mass, [place]/AT Cardiovascular: RRR, S1/S2, no rubs, no gallops Respiratory:  CTA bilaterally, no wheezing, no crackles, no rhonchi Abdomen: Soft/+BS, non tender, non distended, no guarding Extremities: No edema, No lymphangitis, No petechiae, No rashes, no synovitis   Data Reviewed: I have personally reviewed following labs and imaging studies Basic Metabolic Panel: Recent Labs  Lab 12/27/22 1530  NA 131*  K 4.5  CL 100  CO2 22  GLUCOSE 104*  BUN 36*  CREATININE 2.32*  CALCIUM 9.1   Liver Function Tests: No results for input(s): "AST", "ALT", "ALKPHOS", "BILITOT", "PROT", "ALBUMIN" in the last 168 hours. No results for input(s): "LIPASE", "AMYLASE" in the last 168 hours. No results for input(s): "AMMONIA" in the last 168 hours. Coagulation Profile: No results for input(s): "INR", "PROTIME" in the last 168 hours. CBC: Recent Labs  Lab 12/27/22 1530  WBC 6.9  NEUTROABS 4.7  HGB 12.1*  HCT 35.9*  MCV 99.2  PLT 200   Cardiac Enzymes: No results for input(s): "CKTOTAL", "CKMB", "CKMBINDEX", "TROPONINI" in the last 168 hours. BNP: Invalid input(s): "POCBNP" CBG: No results for input(s): "GLUCAP" in the last 168 hours. HbA1C: No results for input(s): "HGBA1C" in the last 72 hours. Urine analysis:    Component Value Date/Time   COLORURINE YELLOW 12/27/2022 1539   APPEARANCEUR CLEAR 12/27/2022 1539   APPEARANCEUR Cloudy (A) 12/24/2022 1209   LABSPEC 1.009 12/27/2022 1539   PHURINE 6.0 12/27/2022 1539   GLUCOSEU NEGATIVE 12/27/2022 1539   HGBUR NEGATIVE 12/27/2022 1539   BILIRUBINUR NEGATIVE 12/27/2022 1539   BILIRUBINUR Negative 12/24/2022 1209   KETONESUR NEGATIVE 12/27/2022 1539   PROTEINUR 100 (A) 12/27/2022 1539   UROBILINOGEN 0.2 10/10/2021 1453   UROBILINOGEN 0.2 01/07/2014 1044   NITRITE NEGATIVE 12/27/2022 1539   LEUKOCYTESUR SMALL (A) 12/27/2022 1539   Sepsis Labs: (procalcitonin:4,lacticidven:4) ) Recent Results (from the past 240 hour(s))  Microscopic Examination     Status: Abnormal   Collection Time: 12/24/22  12:09 PM   Urine  Result Value Ref Range Status   WBC, UA >30 (A) 0 - 5 /hpf Final   RBC, Urine 3-10 (A) 0 - 2 /hpf Final   Epithelial Cells (non renal) 0-10 0 - 10 /hpf Final   Casts Present (A) None seen /lpf Final   Cast Type Hyaline casts N/A Final   Bacteria, UA Many (A) None seen/Few Final     Scheduled Meds:  atorvastatin  40 mg Oral Daily   carvedilol  20 mg Oral Daily   enoxaparin (LOVENOX) injection  30 mg Subcutaneous Q24H   pantoprazole  80 mg Oral Daily   tamsulosin  0.4 mg Oral QPC supper   Continuous Infusions:  sodium chloride 100 mL/hr at 12/27/22 2335   cefTRIAXone (ROCEPHIN)  IV      Procedures/Studies: No results found.  Catarina Hartshorn, DO  [facility]  If 7PM-7AM, please contact night-coverage [email or web address] Password [email or web address] 12/28/2022, 7:03 AM   LOS: 1 day

## 2022-12-28 NOTE — Hospital Course (Addendum)
89 year old male with a history of chronic back pain, hypertension, hyperlipidemia, coronary disease, prostate cancer, CKD stage III, BPH, urinary retention, essential tremor presenting with approximate 1 week history of generalized weakness, urinary frequency and urgency, and dysuria.  The patient contacted his urology office, Dr. Ronne Binning, to discuss the above symptoms on 12/23/2022.  Urine sample was taken to the office on 12/24/2022, and the patient was prescribed empiric Bactrim.  The patient has been compliant with the medication.  His daughter called the urology office on 12/27/2022 to check on the urine culture results.  Unfortunately had not yet resulted, but the UA showed >30WBC.  Unfortunately, the patient continued to have urinary frequency, dysuria, and worsening generalized weakness.  The patient was noted to have a low-grade temperature at home of 99.0 F.  As result, the patient was brought to emergency department for further evaluation and treatment.  The patient denies any headache, chest pain, shortness breath, cough, hemoptysis, nausea, vomiting, diarrhea, hematochezia, melena.  At baseline, the patient is able to ambulate with a walker.  He states that his oral intake is poor.  He complains of some periumbilical and suprapubic discomfort. In the emergency department, a Foley catheter was inserted secondary to urinary retention with return of 600 cc of urine. He was afebrile and hemodynamically stable with oxygen saturation 100% room air. WBC 6.9, hemoglobin 12.1, platelets 20,000.  Sodium 131, potassium 4.5, bicarb 22, serum creatinine 2.32.  12/27/2022 UA 6-10 wbc.  He was started on ceftriaxone and IV fluids.

## 2022-12-29 DIAGNOSIS — R627 Adult failure to thrive: Secondary | ICD-10-CM | POA: Diagnosis not present

## 2022-12-29 DIAGNOSIS — N179 Acute kidney failure, unspecified: Secondary | ICD-10-CM | POA: Diagnosis not present

## 2022-12-29 DIAGNOSIS — N3001 Acute cystitis with hematuria: Secondary | ICD-10-CM

## 2022-12-29 DIAGNOSIS — N1832 Chronic kidney disease, stage 3b: Secondary | ICD-10-CM | POA: Diagnosis not present

## 2022-12-29 LAB — URINE CULTURE: Culture: NO GROWTH

## 2022-12-29 LAB — BASIC METABOLIC PANEL
Anion gap: 7 (ref 5–15)
BUN: 26 mg/dL — ABNORMAL HIGH (ref 8–23)
CO2: 19 mmol/L — ABNORMAL LOW (ref 22–32)
Calcium: 8.6 mg/dL — ABNORMAL LOW (ref 8.9–10.3)
Chloride: 106 mmol/L (ref 98–111)
Creatinine, Ser: 1.89 mg/dL — ABNORMAL HIGH (ref 0.61–1.24)
GFR, Estimated: 33 mL/min — ABNORMAL LOW (ref >60)
Glucose, Bld: 86 mg/dL (ref 70–99)
Potassium: 4.1 mmol/L (ref 3.5–5.1)
Sodium: 132 mmol/L — ABNORMAL LOW (ref 135–145)

## 2022-12-29 LAB — MAGNESIUM: Magnesium: 1.8 mg/dL (ref 1.7–2.4)

## 2022-12-29 MED ORDER — AMOXICILLIN 250 MG PO CAPS: 500.0000 mg | ORAL_CAPSULE | Freq: Three times a day (TID) | ORAL | Status: DC

## 2022-12-29 MED ORDER — AMOXICILLIN 250 MG PO CAPS
500.0000 mg | ORAL_CAPSULE | Freq: Two times a day (BID) | ORAL | Status: DC
  Administered 2022-12-29 – 2022-12-30 (×2): 500 mg via ORAL
  Filled 2022-12-29 (×2): qty 2

## 2022-12-29 MED ORDER — SODIUM CHLORIDE 0.9 % IV SOLN: INTRAVENOUS | Status: DC

## 2022-12-29 MED ORDER — SENNA 8.6 MG PO TABS
2.0000 | ORAL_TABLET | Freq: Every day | ORAL | Status: DC
  Administered 2022-12-29 – 2022-12-30 (×2): 17.2 mg via ORAL
  Filled 2022-12-29 (×2): qty 2

## 2022-12-29 MED ORDER — POLYETHYLENE GLYCOL 3350 17 G PO PACK
17.0000 g | PACK | Freq: Every day | ORAL | Status: DC
  Administered 2022-12-29 – 2022-12-30 (×2): 17 g via ORAL
  Filled 2022-12-29 (×2): qty 1

## 2022-12-29 MED ORDER — TRAZODONE HCL 50 MG PO TABS
50.0000 mg | ORAL_TABLET | Freq: Every day | ORAL | Status: DC
  Administered 2022-12-29: 50 mg via ORAL
  Filled 2022-12-29: qty 1

## 2022-12-29 NOTE — Progress Notes (Signed)
PROGRESS NOTE  [name] [name] ZOX:[id number] DOB: 10/18/1928 DOA: 12/27/2022 PCP: Joaquim Nam, MD  Brief History:  89 year old male with a history of chronic back pain, hypertension, hyperlipidemia, coronary disease, prostate cancer, CKD stage III, BPH, urinary retention, essential tremor presenting with approximate 1 week history of generalized weakness, urinary frequency and urgency, and dysuria.  The patient contacted his urology office, Dr. Ronne Binning, to discuss the above symptoms on 12/23/2022.  Urine sample was taken to the office on 12/24/2022, and the patient was prescribed empiric Bactrim.  The patient has been compliant with the medication.  His daughter called the urology office on 12/27/2022 to check on the urine culture results.  Unfortunately had not yet resulted, but the UA showed >30WBC.  Unfortunately, the patient continued to have urinary frequency, dysuria, and worsening generalized weakness.  The patient was noted to have a low-grade temperature at home of 99.0 F.  As result, the patient was brought to emergency department for further evaluation and treatment.  The patient denies any headache, chest pain, shortness breath, cough, hemoptysis, nausea, vomiting, diarrhea, hematochezia, melena.  At baseline, the patient is able to ambulate with a walker.  He states that his oral intake is poor.  He complains of some periumbilical and suprapubic discomfort. In the emergency department, a Foley catheter was inserted secondary to urinary retention with return of 600 cc of urine. He was afebrile and hemodynamically stable with oxygen saturation 100% room air. WBC 6.9, hemoglobin 12.1, platelets 20,000.  Sodium 131, potassium 4.5, bicarb 22, serum creatinine 2.32.  12/27/2022 UA 6-10 wbc.  He was started on ceftriaxone and IV fluids.   Assessment/Plan: Generalized weakness/failure to thrive -Multifactorial including UTI, acute on chronic renal failure, and  hyponatremia -Continue IV fluids -Continue empiric ceftriaxone -B12--478 -Folic acid--5.5>>replete -TSH--2.187 -PT evaluation   Acute on chronic renal failure--CKD stage IIIb -Baseline creatinine 1.4-1.6 -Patient presented with serum creatinine 2.32 -Second volume depletion -Continue IV fluids>>improving   UTI -Although the patient did not have significant pyuria, he had been on Bactrim prior to admission which may also compromise his culture data -Initially empiric ceftriaxone>>discontinue -start amoxil for Aerococcus and E faecalis   Acute urinary retention -Foley catheter inserted 12/27/2022 with return of 600 cc urine -Continue tamsulosin -plan voiding trial in am   Hyponatremia -Secondary to line depletion, poor solute intake, and renal failure -Continue IV fluids -Monitor BMP   Essential hypertension -Continue carvedilol    Mixed hyperlipidemia -Continue statin             Family Communication:   daughter updated bedside 4/21   Consultants:  none   Code Status:  FULL   DVT Prophylaxis: Bradley Lovenox     Procedures: As Listed in Progress Note Above   Antibiotics: Ceftriaxone 4/19>>4/21 Amoxil 4/21>>        Subjective: Patient denies fevers, chills, headache, chest pain, dyspnea, nausea, vomiting, diarrhea, abdominal pain, dysuria, hematuria, hematochezia, and melena. Complains of constipation  Objective: Vitals:   12/28/22 1344 12/28/22 2110 12/29/22 0528 12/29/22 1507  BP: (!) 140/70 131/77 124/69 (!) 157/71  Pulse: (!) 59 62 66 (!) 59  Resp: 17   18  Temp: 97.9 F (36.6 C) 98 F (36.7 C) 98.2 F (36.8 C) 98.7 F (37.1 C)  TempSrc: Oral Oral Oral Oral  SpO2: 98% 98% 99% 98%  Weight:      Height:        Intake/Output Summary (Last 24  hours) at 12/29/2022 1714 Last data filed at 12/29/2022 0900 Gross per 24 hour  Intake 720 ml  Output 3350 ml  Net -2630 ml   Weight change:  Exam:  General:  Pt is alert, follows commands  appropriately, not in acute distress HEENT: No icterus, No thrush, No neck mass, [place]/AT Cardiovascular: RRR, S1/S2, no rubs, no gallops Respiratory: CTA bilaterally, no wheezing, no crackles, no rhonchi Abdomen: Soft/+BS, non tender, non distended, no guarding Extremities: No edema, No lymphangitis, No petechiae, No rashes, no synovitis   Data Reviewed: I have personally reviewed following labs and imaging studies Basic Metabolic Panel: Recent Labs  Lab 12/27/22 1530 12/28/22 0614 12/29/22 0551  NA 131* 134* 132*  K 4.5 5.1 4.1  CL 100 105 106  CO2 22 22 19*  GLUCOSE 104* 94 86  BUN 36* 32* 26*  CREATININE 2.32* 2.08* 1.89*  CALCIUM 9.1 8.6* 8.6*  MG  --  1.9 1.8  PHOS  --  2.4*  --    Liver Function Tests: Recent Labs  Lab 12/28/22 0614  AST 19  ALT 18  ALKPHOS 55  BILITOT 0.6  PROT 5.8*  ALBUMIN 2.5*   No results for input(s): "LIPASE", "AMYLASE" in the last 168 hours. No results for input(s): "AMMONIA" in the last 168 hours. Coagulation Profile: No results for input(s): "INR", "PROTIME" in the last 168 hours. CBC: Recent Labs  Lab 12/27/22 1530 12/28/22 0614  WBC 6.9 6.7  NEUTROABS 4.7  --   HGB 12.1* 10.7*  HCT 35.9* 31.6*  MCV 99.2 98.1  PLT 200 180   Cardiac Enzymes: Recent Labs  Lab 12/28/22 0744  CKTOTAL 71   BNP: Invalid input(s): "POCBNP" CBG: No results for input(s): "GLUCAP" in the last 168 hours. HbA1C: No results for input(s): "HGBA1C" in the last 72 hours. Urine analysis:    Component Value Date/Time   COLORURINE YELLOW 12/27/2022 1539   APPEARANCEUR CLEAR 12/27/2022 1539   APPEARANCEUR Cloudy (A) 12/24/2022 1209   LABSPEC 1.009 12/27/2022 1539   PHURINE 6.0 12/27/2022 1539   GLUCOSEU NEGATIVE 12/27/2022 1539   HGBUR NEGATIVE 12/27/2022 1539   BILIRUBINUR NEGATIVE 12/27/2022 1539   BILIRUBINUR Negative 12/24/2022 1209   KETONESUR NEGATIVE 12/27/2022 1539   PROTEINUR 100 (A) 12/27/2022 1539   UROBILINOGEN 0.2 10/10/2021 1453    UROBILINOGEN 0.2 01/07/2014 1044   NITRITE NEGATIVE 12/27/2022 1539   LEUKOCYTESUR SMALL (A) 12/27/2022 1539   Sepsis Labs: @LABRCNTIP (procalcitonin:4,lacticidven:4) ) Recent Results (from the past 240 hour(s))  Urine Culture     Status: Abnormal   Collection Time: 12/24/22 12:09 PM   Specimen: Urine   UR  Result Value Ref Range Status   Urine Culture, Routine Final report (A)  Final   Organism ID, Bacteria Aerococcus urinae (A)  Final    Comment: Greater than 100,000 colony forming units per mL Aerococcus urinae has been described as susceptible to penicillin, amoxicillin, piperacillin, cefepime, rifampin, and nitrofurantoin, and resistant to sulfonamides.    ORGANISM ID, BACTERIA Enterococcus faecalis (A)  Final    Comment: For Enterococcus species, aminoglycosides (except for high-level resistance screening), cephalosporins, clindamycin, and trimethoprim-sulfamethoxazole are not effective clinically. (CLSI, M100-S26, 2016) Greater than 100,000 colony forming units per mL    Antimicrobial Susceptibility Comment  Final    Comment:       ** S = Susceptible; I = Intermediate; R = Resistant **                    P =  Positive; N = Negative             MICS are expressed in micrograms per mL    Antibiotic                 RSLT#1    RSLT#2    RSLT#3    RSLT#4 Ciprofloxacin                            S Levofloxacin                             S Nitrofurantoin                           S Penicillin                               S Tetracycline                             R Vancomycin                               S   Microscopic Examination     Status: Abnormal   Collection Time: 12/24/22 12:09 PM   Urine  Result Value Ref Range Status   WBC, UA >30 (A) 0 - 5 /hpf Final   RBC, Urine 3-10 (A) 0 - 2 /hpf Final   Epithelial Cells (non renal) 0-10 0 - 10 /hpf Final   Casts Present (A) None seen /lpf Final   Cast Type Hyaline casts N/A Final   Bacteria, UA Many (A) None seen/Few  Final  Urine Culture     Status: None   Collection Time: 12/27/22  3:39 PM   Specimen: Urine, Clean Catch  Result Value Ref Range Status   Specimen Description   Final    URINE, CLEAN CATCH Performed at [facility], [address], [place], [place] [address]    Special Requests   Final    NONE Performed at [facility], [address] [place], [place] [address]    Culture   Final    NO GROWTH Performed at [facility], [address], [place], [place] [address]    Report Status 12/29/2022 FINAL  Final     Scheduled Meds:  amoxicillin  500 mg Oral Q12H   atorvastatin  40 mg Oral Daily   carvedilol  6.25 mg Oral BID WC   enoxaparin (LOVENOX) injection  30 mg Subcutaneous Q24H   folic acid  1 mg Oral Daily   pantoprazole  80 mg Oral Daily   polyethylene glycol  17 g Oral Daily   senna  2 tablet Oral Daily   tamsulosin  0.4 mg Oral QPC supper   traZODone  50 mg Oral QHS   Continuous Infusions:  sodium chloride 75 mL/hr at 12/29/22 1009    Procedures/Studies: No results found.  Catarina Hartshorn, DO  [facility]  If 7PM-7AM, please contact night-coverage [email or web address] Password [facility] 12/29/2022, 5:14 PM   LOS: 2 days

## 2022-12-29 NOTE — Progress Notes (Signed)
PHARMACY NOTE:  ANTIMICROBIAL RENAL DOSAGE ADJUSTMENT  Current antimicrobial regimen includes a mismatch between antimicrobial dosage and estimated renal function.  As per policy approved by the Pharmacy & Therapeutics and Medical Executive Committees, the antimicrobial dosage will be adjusted accordingly.  Current antimicrobial dosage:  Amoxicillin  po q8h  Indication: UTI  Renal Function:  Estimated Creatinine Clearance: 27.1 mL/min (A) (by C-G formula based on SCr of 1.89 mg/dL (H)).    Antimicrobial dosage has been changed to:  Amoxicillin  po q12h  Thank you for allowing pharmacy to be a part of this patient's care.  Christoper Fabian, PharmD, BCPS Please see amion for complete clinical pharmacist phone list 12/29/2022 4:54 PM

## 2022-12-30 DIAGNOSIS — N179 Acute kidney failure, unspecified: Secondary | ICD-10-CM | POA: Diagnosis not present

## 2022-12-30 DIAGNOSIS — R339 Retention of urine, unspecified: Secondary | ICD-10-CM | POA: Diagnosis not present

## 2022-12-30 DIAGNOSIS — E871 Hypo-osmolality and hyponatremia: Secondary | ICD-10-CM | POA: Diagnosis not present

## 2022-12-30 DIAGNOSIS — N3000 Acute cystitis without hematuria: Secondary | ICD-10-CM | POA: Diagnosis not present

## 2022-12-30 LAB — BASIC METABOLIC PANEL
Anion gap: 6 (ref 5–15)
BUN: 23 mg/dL (ref 8–23)
CO2: 20 mmol/L — ABNORMAL LOW (ref 22–32)
Calcium: 8.7 mg/dL — ABNORMAL LOW (ref 8.9–10.3)
Chloride: 108 mmol/L (ref 98–111)
Creatinine, Ser: 1.76 mg/dL — ABNORMAL HIGH (ref 0.61–1.24)
GFR, Estimated: 36 mL/min — ABNORMAL LOW (ref >60)
Glucose, Bld: 89 mg/dL (ref 70–99)
Potassium: 4.1 mmol/L (ref 3.5–5.1)
Sodium: 134 mmol/L — ABNORMAL LOW (ref 135–145)

## 2022-12-30 LAB — MAGNESIUM: Magnesium: 1.8 mg/dL (ref 1.7–2.4)

## 2022-12-30 MED ORDER — FOLIC ACID 1 MG PO TABS: 1.0000 mg | ORAL_TABLET | Freq: Every day | ORAL | Status: DC

## 2022-12-30 MED ORDER — LACTULOSE 10 GM/15ML PO SOLN: 20.0000 g | Freq: Once | ORAL | Status: DC

## 2022-12-30 MED ORDER — AMOXICILLIN 500 MG PO CAPS: 500.0000 mg | ORAL_CAPSULE | Freq: Two times a day (BID) | ORAL | 0 refills | Status: DC

## 2022-12-30 MED ORDER — CARVEDILOL 12.5 MG PO TABS: 12.5000 mg | ORAL_TABLET | Freq: Two times a day (BID) | ORAL | Status: DC

## 2022-12-30 NOTE — Evaluation (Signed)
Physical Therapy Evaluation Patient Details Name: [name] [name] MRN: [id number] DOB: 1929-08-05 Today's Date: 12/30/2022  History of Present Illness  [name] [name] is a 89 y.o. male with medical history significant of chronic back pain, hypertension, hyperlipidemia CAD, history of prostate cancer who presents to the emergency department via EMS due to several days of onset of difficulty urination, urgency, frequency and inability to completely empty his bladder.  He consulted with his urologist on Monday (4/15) and was started on Bactrim, but patient continued to have burning sensation on urination and urinary frequency, he complained of weakness, low back and abdominal pain.   Clinical Impression  Patient functioning at baseline for functional mobility and gait other than c/o mild soreness dorsal surface right foot, other wise demonstrates good return for bed mobility, transfers and walking in room/hallway without loss of balance using RW.  Plan:  Patient discharged from physical therapy to care of nursing for ambulation daily as tolerated for length of stay.         Recommendations for follow up therapy are one component of a multi-disciplinary discharge planning process, led by the attending physician.  Recommendations may be updated based on patient status, additional functional criteria and insurance authorization.  Follow Up Recommendations       Assistance Recommended at Discharge    Patient can return home with the following  Help with stairs or ramp for entrance;Assistance with cooking/housework    Equipment Recommendations None recommended by PT  Recommendations for Other Services       Functional Status Assessment Patient has had a recent decline in their functional status and/or demonstrates limited ability to make significant improvements in function in a reasonable and predictable amount of time     Precautions / Restrictions Precautions Precautions:  Fall Restrictions Weight Bearing Restrictions: No      Mobility  Bed Mobility Overal bed mobility: Modified Independent                  Transfers Overall transfer level: Modified independent                      Ambulation/Gait Ambulation/Gait assistance: Modified independent (Device/Increase time) Gait Distance (Feet): 120 Feet Assistive device: Rolling walker (2 wheels) Gait Pattern/deviations: Decreased step length - right, Decreased step length - left, Decreased stride length Gait velocity: slightly decreased     General Gait Details: grossly WFL demonstrating good return for ambulating in room/hallway without loss of balance using RW  Stairs            Wheelchair Mobility    Modified Rankin (Stroke Patients Only)       Balance Overall balance assessment: Needs assistance Sitting-balance support: Feet supported, No upper extremity supported Sitting balance-Leahy Scale: Good Sitting balance - Comments: seated at EOB   Standing balance support: During functional activity, Bilateral upper extremity supported Standing balance-Leahy Scale: Fair Standing balance comment: fair/good using RW                             Pertinent Vitals/Pain Pain Assessment Pain Assessment: No/denies pain    Home Living Family/patient expects to be discharged to:: Private residence Living Arrangements: Children;Other relatives Available Help at Discharge: Family Type of Home: House Home Access: Stairs to enter Entrance Stairs-Rails: None Entrance Stairs-Number of Steps: 3   Home Layout: One level Home Equipment: Agricultural consultant (2 wheels);Cane - quad;Grab bars - toilet;Shower seat  Prior Function Prior Level of Function : Independent/Modified Independent             Mobility Comments: Pt reports decline in function but ambulating about home and community with RW now. ADLs Comments: Independent     Hand Dominance   Dominant  Hand: Right    Extremity/Trunk Assessment   Upper Extremity Assessment Upper Extremity Assessment: Overall WFL for tasks assessed    Lower Extremity Assessment Lower Extremity Assessment: Overall WFL for tasks assessed    Cervical / Trunk Assessment Cervical / Trunk Assessment: Normal  Communication   Communication: No difficulties  Cognition Arousal/Alertness: Awake/alert Behavior During Therapy: WFL for tasks assessed/performed Overall Cognitive Status: Within Functional Limits for tasks assessed                                          General Comments      Exercises     Assessment/Plan    PT Assessment Patient does not need any further PT services  PT Problem List         PT Treatment Interventions      PT Goals (Current goals can be found in the Care Plan section)  Acute Rehab PT Goals Patient Stated Goal: return home with family to assist PT Goal Formulation: With patient Time For Goal Achievement: 12/30/22 Potential to Achieve Goals: Good    Frequency       Co-evaluation               AM-PAC PT "6 Clicks" Mobility  Outcome Measure Help needed turning from your back to your side while in a flat bed without using bedrails?: None Help needed moving from lying on your back to sitting on the side of a flat bed without using bedrails?: None Help needed moving to and from a bed to a chair (including a wheelchair)?: None Help needed standing up from a chair using your arms (e.g., wheelchair or bedside chair)?: None Help needed to walk in hospital room?: None Help needed climbing 3-5 steps with a railing? : A Little 6 Click Score: 23    End of Session   Activity Tolerance: Patient tolerated treatment well Patient left: in chair;with call bell/phone within reach Nurse Communication: Mobility status PT Visit Diagnosis: Unsteadiness on feet (R26.81);Other abnormalities of gait and mobility (R26.89);Muscle weakness (generalized)  (M62.81)    Time: 7253-6644 PT Time Calculation (min) (ACUTE ONLY): 18 min   Charges:   PT Evaluation $PT Eval Low Complexity: 1 Low PT Treatments $Therapeutic Activity: 8-22 mins        12:20 PM, 12/30/22 Ocie Bob, MPT Physical Therapist with [facility] 336 [phone] office [phone] mobile phone

## 2022-12-30 NOTE — Consult Note (Signed)
   [facility] Inpatient Consult   12/30/2022  [name] [name] 1929-05-09 [id number]     Location: [facility] RN Hospital Liaison screened remotely([place]).   [facility] Customer service manager [facility]) Accountable Care Organization [ACO] Patient: Insurance (1. MCR 2. VA 3. Tricare)    Primary Care Provider:  Joaquim Nam, MD [facility].   Patient screened for readmission hospitalization with noted medium risk score for unplanned readmission risk with 2 IP in 6 months. [facility]/Population Health RN liaison will assess for potential [facility]) Care Management service needs for post hospital transition for care coordination. Attempted outreach call unsuccessful at this time.  Plan. [facility]/Population Health RN Liaison will continue to follow ongoing disposition in assessing for post hospital community care coordination/management needs.  Referral request for community care coordination: Anticipate [facility] TOC will follow up post hospital discharge if no other needs arise.   [facility] Care Management/Population Health does not replace or interfere with any arrangements made by the Inpatient Transition of Care team.   For questions contact:   Elliot Cousin, RN, BSN [facility] [address] [facility] Liaison [place]   [facility]  Population Health Office Hours MTWF 8:00 am to 6 pm off on Thursday [phone] mobile [phone] [Office toll free line][facility] Office Hours are M-F 8:30 - 5 pm 24 hour nurse advise line [phone] Conceirge  [name]

## 2022-12-30 NOTE — Progress Notes (Signed)
Patient given enema.

## 2022-12-30 NOTE — Discharge Summary (Signed)
Physician Discharge Summary   Patient: [name] [name] MRN: [id number] DOB: 09/24/1928  Admit date:     12/27/2022  Discharge date: 12/30/22  Discharge Physician: Onalee Hua Karrington Studnicka   PCP: Joaquim Nam, MD   Recommendations at discharge:   Please follow up with primary care provider within 1-2 weeks  Please repeat BMP and CBC in one week     Hospital Course: 89 year old male with a history of chronic back pain, hypertension, hyperlipidemia, coronary disease, prostate cancer, CKD stage III, BPH, urinary retention, essential tremor presenting with approximate 1 week history of generalized weakness, urinary frequency and urgency, and dysuria.  The patient contacted his urology office, Dr. Ronne Binning, to discuss the above symptoms on 12/23/2022.  Urine sample was taken to the office on 12/24/2022, and the patient was prescribed empiric Bactrim.  The patient has been compliant with the medication.  His daughter called the urology office on 12/27/2022 to check on the urine culture results.  Unfortunately had not yet resulted, but the UA showed >30WBC.  Unfortunately, the patient continued to have urinary frequency, dysuria, and worsening generalized weakness.  The patient was noted to have a low-grade temperature at home of 99.0 F.  As result, the patient was brought to emergency department for further evaluation and treatment.  The patient denies any headache, chest pain, shortness breath, cough, hemoptysis, nausea, vomiting, diarrhea, hematochezia, melena.  At baseline, the patient is able to ambulate with a walker.  He states that his oral intake is poor.  He complains of some periumbilical and suprapubic discomfort. In the emergency department, a Foley catheter was inserted secondary to urinary retention with return of 600 cc of urine. He was afebrile and hemodynamically stable with oxygen saturation 100% room air. WBC 6.9, hemoglobin 12.1, platelets 20,000.  Sodium 131, potassium 4.5, bicarb 22, serum  creatinine 2.32.  12/27/2022 UA 6-10 wbc.  He was started on ceftriaxone and IV fluids.  Assessment and Plan: Generalized weakness/failure to thrive -Multifactorial including UTI, acute on chronic renal failure, and hyponatremia -Continue IV fluids -Continue empiric ceftriaxone -B12--478 -Folic acid--5.5>>replete -TSH--2.187 -PT evaluation--no PT follow up   Acute on chronic renal failure--CKD stage IIIb -Baseline creatinine 1.4-1.6 -Patient presented with serum creatinine 2.32 -Second volume depletion -Continue IV fluids>>improving -serum creatinine 1.76 on day of d/c   UTI -Although the patient did not have significant pyuria, he had been on Bactrim prior to admission which may also compromise his culture data -Initially empiric ceftriaxone>>discontinue -start amoxil for Aerococcus and E faecalis -plan 6 more days amoxil after d/c   Acute urinary retention -Foley catheter inserted 12/27/2022 with return of 600 cc urine -Continue tamsulosin -foley removed and patient able to urinate spontaneously on 4/22   Hyponatremia -Secondary to line depletion, poor solute intake, and renal failure -Continue IV fluids>>improved -Monitor BMP   Essential hypertension -Continue carvedilol  -will not restart lisinopril in setting of CKD 3b   Mixed hyperlipidemia -Continue statin          Consultants: none Procedures performed: none  Disposition: Home Diet recommendation:  Cardiac diet DISCHARGE MEDICATION: Allergies as of 12/30/2022   No Known Allergies      Medication List     STOP taking these medications    lisinopril 20 MG tablet Commonly known as: ZESTRIL   sulfamethoxazole-trimethoprim 800-160 MG tablet Commonly known as: BACTRIM DS       TAKE these medications    acetaminophen 500 MG tablet Commonly known as: TYLENOL Take 2 tablets (1,000 mg total)  by mouth 3 (three) times daily as needed for mild pain or moderate pain.   allopurinol 300 MG  tablet Commonly known as: ZYLOPRIM TAKE 1 TABLET DAILY   amoxicillin 500 MG capsule Commonly known as: AMOXIL Take 1 capsule (500 mg total) by mouth every 12 (twelve) hours.   aspirin 81 MG tablet Take 81 mg by mouth daily.   atorvastatin 40 MG tablet Commonly known as: LIPITOR TAKE 1 TABLET DAILY   carvedilol 20 MG 24 hr capsule Commonly known as: COREG CR TAKE 1 CAPSULE DAILY   folic acid 1 MG tablet Commonly known as: FOLVITE Take 1 tablet (1 mg total) by mouth daily. Start taking on: December 31, 2022   melatonin 3 MG Tabs tablet Take 1 tablet (3 mg total) by mouth at bedtime.   omeprazole 40 MG capsule Commonly known as: PRILOSEC TAKE 1 CAPSULE DAILY AS NEEDED   primidone 50 MG tablet Commonly known as: MYSOLINE TAKE ONE-HALF (1/2) TABLET DAILY   senna-docusate 8.6-50 MG tablet Commonly known as: Senokot-S Take 1-2 tablets by mouth daily.   silodosin 8 MG Caps capsule Commonly known as: RAPAFLO Take 1 capsule (8 mg total) by mouth at bedtime. What changed: Another medication with the same name was removed. Continue taking this medication, and follow the directions you see here.   traZODone 50 MG tablet Commonly known as: DESYREL TAKE 1 TABLET AT BEDTIME        Discharge Exam: Filed Weights   12/27/22 1450  Weight: 79.8 kg   HEENT:  [place]/AT, No thrush, no icterus CV:  RRR, no rub, no S3, no S4 Lung:  CTA, no wheeze, no rhonchi Abd:  soft/+BS, NT Ext:  No edema, no lymphangitis, no synovitis, no rash   Condition at discharge: stable  The results of significant diagnostics from this hospitalization (including imaging, microbiology, ancillary and laboratory) are listed below for reference.   Imaging Studies: No results found.  Microbiology: Results for orders placed or performed during the hospital encounter of 12/27/22  Urine Culture     Status: None   Collection Time: 12/27/22  3:39 PM   Specimen: Urine, Clean Catch  Result Value Ref Range  Status   Specimen Description   Final    URINE, CLEAN CATCH Performed at [facility], [address], [place], [place] [address]    Special Requests   Final    NONE Performed at [facility], [address], [place], [place] [address]    Culture   Final    NO GROWTH Performed at [facility], [address], [place], [place] [address]    Report Status 12/29/2022 FINAL  Final    Labs: CBC: Recent Labs  Lab 12/27/22 1530 12/28/22 0614  WBC 6.9 6.7  NEUTROABS 4.7  --   HGB 12.1* 10.7*  HCT 35.9* 31.6*  MCV 99.2 98.1  PLT 200 180   Basic Metabolic Panel: Recent Labs  Lab 12/27/22 1530 12/28/22 0614 12/29/22 0551 12/30/22 0426  NA 131* 134* 132* 134*  K 4.5 5.1 4.1 4.1  CL 100 105 106 108  CO2 22 22 19* 20*  GLUCOSE 104* 94 86 89  BUN 36* 32* 26* 23  CREATININE 2.32* 2.08* 1.89* 1.76*  CALCIUM 9.1 8.6* 8.6* 8.7*  MG  --  1.9 1.8 1.8  PHOS  --  2.4*  --   --    Liver Function Tests: Recent Labs  Lab 12/28/22 0614  AST 19  ALT 18  ALKPHOS  55  BILITOT 0.6  PROT 5.8*  ALBUMIN 2.5*   CBG: No results for input(s): "GLUCAP" in the last 168 hours.  Discharge time spent: greater than 30 minutes.  Signed: Catarina Hartshorn, MD [facility] 12/30/2022

## 2022-12-31 ENCOUNTER — Telehealth: Payer: Self-pay | Admitting: Family Medicine

## 2022-12-31 ENCOUNTER — Telehealth: Payer: Self-pay | Admitting: *Deleted

## 2022-12-31 MED ORDER — AMLODIPINE BESYLATE 5 MG PO TABS
5.0000 mg | ORAL_TABLET | Freq: Every day | ORAL | 1 refills | Status: DC
Start: 2022-12-31 — End: 2023-01-10

## 2022-12-31 NOTE — Telephone Encounter (Signed)
Spoke with the patient and advised as instructed. Pt understood and agrees with plan.  

## 2022-12-31 NOTE — Telephone Encounter (Signed)
Hospital wanted to take patient off of lisinopril ,due to his chronic kidney disease and they said that the lisinopril causes damage to his kidneys. She would like to know is there is a substitute b/p medication that he can take because his b/p readings are high. She would like to know should he continue to take the lisinopril until he comes in for his hospital f/u with [facility] on Monday?

## 2022-12-31 NOTE — Transitions of Care (Post Inpatient/ED Visit) (Signed)
   12/31/2022  Name: [name] [name] MRN: [id number] DOB: 11-08-1928  Today's TOC FU Call Status: Today's TOC FU Call Status:: Successful TOC FU Call Competed TOC FU Call Complete Date: 12/31/22  Transition Care Management Follow-up Telephone Call Date of Discharge: 12/30/22 [name] Type of Discharge: Inpatient Admission Primary Inpatient Discharge Diagnosis:: UTI How have you been since you were released from the [name]?: Same (per patient he has went to 5x already) Any questions or concerns?: Yes Patient Questions/Concerns:: lisonpril was stopped and wanted to know the reason why. RN explained why the hospitalist had stopped it due to ckd 3 B. Patient bp today was 171/86 Patient Questions/Concerns Addressed: Notified Provider of Patient Questions/Concerns  Items Reviewed: Did you receive and understand the discharge instructions provided?: Yes Medications obtained and verified?: Yes (Medications Reviewed) Any new allergies since your discharge?: No Dietary orders reviewed?: No Do you have support at home?: Yes People in Home: alone Name of Support/Comfort Primary Source: [name] [name] and Equipment/Supplies: Were Home Health Services Ordered?: No Any new equipment or medical supplies ordered?: No  Functional Questionnaire: Do you need assistance with bathing/showering or dressing?: Yes Do you need assistance with meal preparation?: Yes Do you need assistance with eating?: No Do you have difficulty maintaining continence: No Do you need assistance with getting out of bed/getting out of a chair/moving?: No Do you have difficulty managing or taking your medications?: No  Follow up appointments reviewed: PCP Follow-up appointment confirmed?: Yes Date of PCP follow-up appointment?: 01/06/23 Follow-up Provider: 1030 Specialist [name] Follow-up appointment confirmed?: NA Reason Specialist Follow-Up Not Confirmed: Appointment Sceduled by [name] [place]  Calling Clinician Do you need transportation to your follow-up appointment?: No Do you understand care options if your condition(s) worsen?: Yes-patient verbalized understanding   Interventions Today    Flowsheet Row Most Recent Value  General Interventions   General Interventions Discussed/Reviewed General Interventions Discussed, General Interventions Reviewed, Doctor Visits  Doctor Visits Discussed/Reviewed Doctor Visits Discussed, Doctor Visits Reviewed  Pharmacy Interventions   Pharmacy Dicussed/Reviewed Pharmacy Topics Discussed, Pharmacy Topics Reviewed  [hsopitalist stopped lisinopril due to ckd 3b. referred to PCP due to elevated BP]      TOC Interventions Today    Flowsheet Row Most Recent Value  TOC Interventions   TOC Interventions Discussed/Reviewed Arranged PCP follow up within 7 days/Care Guide scheduled, TOC Interventions Discussed, TOC Interventions Reviewed        [name] BSN RN [facility] [phone]

## 2022-12-31 NOTE — Telephone Encounter (Signed)
Md made aware on 4/19. Md will not change treatment until culture results. Daughter and pt made aware on 4/19.

## 2022-12-31 NOTE — Telephone Encounter (Signed)
I would add on amlodipine  a day.  Let me know if he has more ankle swelling in the meantime.  Would continue carvedilol in the meantime but wouldn't restart lisinopril yet.   I sent the amlodipine rx to [id number] - , [place] - [address]   Thanks.

## 2023-01-01 NOTE — Telephone Encounter (Signed)
See other phone note.  Thanks.  

## 2023-01-06 ENCOUNTER — Ambulatory Visit (INDEPENDENT_AMBULATORY_CARE_PROVIDER_SITE_OTHER): Payer: Medicare Other | Admitting: Family Medicine

## 2023-01-06 ENCOUNTER — Encounter: Payer: Self-pay | Admitting: Family Medicine

## 2023-01-06 VITALS — BP 126/70 | HR 60 | Temp 98.0°F | Ht 71.0 in | Wt 176.0 lb

## 2023-01-06 DIAGNOSIS — N183 Chronic kidney disease, stage 3 unspecified: Secondary | ICD-10-CM | POA: Diagnosis not present

## 2023-01-06 DIAGNOSIS — I10 Essential (primary) hypertension: Secondary | ICD-10-CM | POA: Diagnosis not present

## 2023-01-06 DIAGNOSIS — R300 Dysuria: Secondary | ICD-10-CM | POA: Diagnosis not present

## 2023-01-06 LAB — BASIC METABOLIC PANEL
BUN: 38 mg/dL — ABNORMAL HIGH (ref 6–23)
CO2: 25 mEq/L (ref 19–32)
Calcium: 9.7 mg/dL (ref 8.4–10.5)
Chloride: 102 mEq/L (ref 96–112)
Creatinine, Ser: 1.66 mg/dL — ABNORMAL HIGH (ref 0.40–1.50)
GFR: 35.71 mL/min — ABNORMAL LOW (ref >60.00)
Glucose, Bld: 150 mg/dL — ABNORMAL HIGH (ref 70–99)
Potassium: 4.9 mEq/L (ref 3.5–5.1)
Sodium: 133 mEq/L — ABNORMAL LOW (ref 135–145)

## 2023-01-06 LAB — CBC WITH DIFFERENTIAL/PLATELET
Basophils Absolute: 0 10*3/uL (ref 0.0–0.1)
Basophils Relative: 0.4 % (ref 0.0–3.0)
Eosinophils Absolute: 0 10*3/uL (ref 0.0–0.7)
Eosinophils Relative: 0 % (ref 0.0–5.0)
HCT: 35.8 % — ABNORMAL LOW (ref 39.0–52.0)
Hemoglobin: 12.2 g/dL — ABNORMAL LOW (ref 13.0–17.0)
Lymphocytes Relative: 26.3 % (ref 12.0–46.0)
Lymphs Abs: 1.8 10*3/uL (ref 0.7–4.0)
MCHC: 34 g/dL (ref 30.0–36.0)
MCV: 98.4 fl (ref 78.0–100.0)
Monocytes Absolute: 0.6 10*3/uL (ref 0.1–1.0)
Monocytes Relative: 8.5 % (ref 3.0–12.0)
Neutro Abs: 4.5 10*3/uL (ref 1.4–7.7)
Neutrophils Relative %: 64.8 % (ref 43.0–77.0)
Platelets: 272 10*3/uL (ref 150.0–400.0)
RBC: 3.64 Mil/uL — ABNORMAL LOW (ref 4.22–5.81)
RDW: 15.4 % (ref 11.5–15.5)
WBC: 6.9 10*3/uL (ref 4.0–10.5)

## 2023-01-06 MED ORDER — AMOXICILLIN 500 MG PO CAPS: 500.0000 mg | ORAL_CAPSULE | Freq: Two times a day (BID) | ORAL | 0 refills | Status: DC

## 2023-01-06 NOTE — Progress Notes (Unsigned)
Admit date:     12/27/2022 Discharge date: 12/30/22 Discharge Physician: Onalee Hua Tat   PCP: Joaquim Nam, MD    Recommendations at discharge:   Please follow up with primary care provider within 1-2 weeks  Please repeat BMP and CBC in one week    Hospital Course: 89 year old male with a history of chronic back pain, hypertension, hyperlipidemia, coronary disease, prostate cancer, CKD stage III, BPH, urinary retention, essential tremor presenting with approximate 1 week history of generalized weakness, urinary frequency and urgency, and dysuria.  The patient contacted his urology office, Dr. Ronne Binning, to discuss the above symptoms on 12/23/2022.  Urine sample was taken to the office on 12/24/2022, and the patient was prescribed empiric Bactrim.  The patient has been compliant with the medication.  His daughter called the urology office on 12/27/2022 to check on the urine culture results.  Unfortunately had not yet resulted, but the UA showed >30WBC.  Unfortunately, the patient continued to have urinary frequency, dysuria, and worsening generalized weakness.  The patient was noted to have a low-grade temperature at home of 99.0 F.  As result, the patient was brought to emergency department for further evaluation and treatment.   The patient denies any headache, chest pain, shortness breath, cough, hemoptysis, nausea, vomiting, diarrhea, hematochezia, melena.  At baseline, the patient is able to ambulate with a walker.  He states that his oral intake is poor.  He complains of some periumbilical and suprapubic discomfort. In the emergency department, a Foley catheter was inserted secondary to urinary retention with return of 600 cc of urine. He was afebrile and hemodynamically stable with oxygen saturation 100% room air. WBC 6.9, hemoglobin 12.1, platelets 20,000.  Sodium 131, potassium 4.5, bicarb 22, serum creatinine 2.32.  12/27/2022 UA 6-10 wbc.  He was started on ceftriaxone and IV fluids.    Assessment and Plan: Generalized weakness/failure to thrive -Multifactorial including UTI, acute on chronic renal failure, and hyponatremia -Continue IV fluids -Continue empiric ceftriaxone -B12--478 -Folic acid--5.5>>replete -TSH--2.187 -PT evaluation--no PT follow up   Acute on chronic renal failure--CKD stage IIIb -Baseline creatinine 1.4-1.6 -Patient presented with serum creatinine 2.32 -Second volume depletion -Continue IV fluids>>improving -serum creatinine 1.76 on day of d/c   UTI -Although the patient did not have significant pyuria, he had been on Bactrim prior to admission which may also compromise his culture data -Initially empiric ceftriaxone>>discontinue -start amoxil for Aerococcus and E faecalis -plan 6 more days amoxil after d/c   Acute urinary retention -Foley catheter inserted 12/27/2022 with return of 600 cc urine -Continue tamsulosin -foley removed and patient able to urinate spontaneously on 4/22   Hyponatremia -Secondary to line depletion, poor solute intake, and renal failure -Continue IV fluids>>improved -Monitor BMP   Essential hypertension -Continue carvedilol  -will not restart lisinopril in setting of CKD 3b   Mixed hyperlipidemia -Continue statin =================================  [facility] re: lesion on L ear.    Inpatient course discussed with patient.  Admitted with generalized weakness that was likely multifactorial, related to UTI, acute on chronic renal failure.  Treated with antibiotics and lisinopril was held in the meantime.  Done with amoxil.  Still with dysuria but better.  Rationale for amlodipine d/w pt.  BP controlled. Off lisinopril in the meantime.  He has urology follow-up pending.  Using walker at baseline.    No fevers.  No abdominal pain.  Meds, vitals, and allergies reviewed.   ROS: Per HPI unless specifically indicated in ROS section  GEN: nad, alert and oriented HEENT: ncat, irritated lesion on  the superior aspect of the left pinna. NECK: supple w/o LA CV: rrr.  PULM: ctab, no inc wob ABD: soft, +bs, not ttp EXT: no edema SKIN: Well-perfused

## 2023-01-06 NOTE — Patient Instructions (Signed)
Go to the lab on the way out.   If you have mychart we'll likely use that to update you.    Take care.  Glad to see you. Restart amoxil.    Update me about your situation and symptoms in about 3 days.

## 2023-01-08 DIAGNOSIS — R300 Dysuria: Secondary | ICD-10-CM | POA: Insufficient documentation

## 2023-01-08 NOTE — Assessment & Plan Note (Signed)
Continue on amlodipine for now.  Would not start lisinopril yet.  Rationale discussed with patient.  He agrees to plan. 30 minutes were devoted to patient care in this encounter (this includes time spent reviewing the patient's file/history, interviewing and examining the patient, counseling/reviewing plan with patient).

## 2023-01-08 NOTE — Assessment & Plan Note (Signed)
With recent creatinine elevation discussed with patient.  Off lisinopril.  Recheck labs pending.  See notes on labs.

## 2023-01-08 NOTE — Assessment & Plan Note (Addendum)
Restart amoxil given his dysuria.  I asked him to update me about his situation and symptoms in about 3 days.    Continue using walker in the meantime given his history of deconditioning.

## 2023-01-09 ENCOUNTER — Telehealth: Payer: Self-pay | Admitting: *Deleted

## 2023-01-09 NOTE — Progress Notes (Signed)
  Care Coordination   Note   01/09/2023 Name: BRONCO MCGRORY MRN: [id number] DOB: May 03, 1929  VINAY ERTL is a 89 y.o. year old male who sees Joaquim Nam, MD for primary care. I reached out to Ann Lions by phone today to offer care coordination services.  Mr. Berenson was given information about Care Coordination services today including:   The Care Coordination services include support from the care team which includes your Nurse Coordinator, Clinical Social Worker, or Pharmacist.  The Care Coordination team is here to help remove barriers to the health concerns and goals most important to you. Care Coordination services are voluntary, and the patient may decline or stop services at any time by request to their care team member.   Care Coordination Consent Status: Patient agreed to services and verbal consent obtained.   Follow up plan:  Telephone appointment with care coordination team member scheduled for:  01/14/2023  Encounter Outcome:  Pt. Scheduled  Burman Nieves, CCMA Care Coordination Care Guide Direct Dial: [phone]

## 2023-01-10 ENCOUNTER — Other Ambulatory Visit: Payer: Self-pay | Admitting: Family Medicine

## 2023-01-10 ENCOUNTER — Telehealth: Payer: Self-pay | Admitting: Family Medicine

## 2023-01-10 MED ORDER — AMLODIPINE BESYLATE 5 MG PO TABS: 7.5000 mg | ORAL_TABLET | Freq: Every day | ORAL | Status: DC

## 2023-01-10 NOTE — Telephone Encounter (Signed)
-----   Message from Albertine Patricia, CMA sent at 01/09/2023 11:30 AM EDT ----- Regarding: update Good morning,  I spoke with pt daughter today and she asked me to relay a message to you know that amoxicillin is working and pt is doing much better. Thanks!  Burman Nieves, CCMA Care Coordination Care Guide Direct Dial: [phone]

## 2023-01-10 NOTE — Telephone Encounter (Signed)
Noted. Thanks. See result note.  

## 2023-01-14 ENCOUNTER — Ambulatory Visit: Payer: Self-pay

## 2023-01-14 NOTE — Patient Outreach (Signed)
  Care Coordination   Initial Visit Note   01/14/2023 Name: [name] [name] MRN: [id number] DOB: [birth year]  [name] [name] is a 89 y.o. year old male who sees Joaquim Nam, MD for primary care. I spoke with  Ann Lions by phone today.  What matters to the patients health and wellness today?  Patient states he is doing better since hospitalization from 12/27/22-12/30/22 due to UTI.    He denies any UTI symptoms. Patient reports completing his antibiotics on yesterday.  Patient denies being discharged from  hospital with foley catheter.  Patient states he is urinating without difficulty.  Patient confirms his lisinopril was discontinued and he is now taking amlodipine as prescribed.  Patient reports today's blood pressure reading was 142/80, and recent readings of 137/74, 138/77.     Goals Addressed             This Visit's Progress    Management/ education of health conditions.       Interventions Today    Flowsheet Row Most Recent Value  Chronic Disease   Chronic disease during today's visit Hypertension (HTN), Other  [BPH, urinary retention]  General Interventions   General Interventions Discussed/Reviewed General Interventions Reviewed, Doctor Visits, Labs  [evaluation of current treatment plan for HTN, BPH/ Urinary retention and patients adherence to plan as established by provider. Assessed for blood pressure readings. Assessed for UTI symptoms.]  Labs Kidney Function  [reviewed kidney function lab work completed on 01/06/23.]  Doctor Visits Discussed/Reviewed Doctor Visits Reviewed  Annabell Sabal scheduled/ upcoming provider appointments.]  Education Interventions   Education Provided Provided Education, Provided Printed Education  [Advised to continue to monitor blood pressure daily and record.  Avoid drinking large amounts of liquid before going to bed or out in public.  Avoid or reduce how much caffeine or alcohol you drink.  Give yourself time when you urinate.]   Provided Verbal Education On When to see the doctor, Other  [Advised to notify your provider for any urinary changes or any changes in blood pressure.  Patient mailed education information on HTN and BPH]  Pharmacy Interventions   Pharmacy Dicussed/Reviewed Pharmacy Topics Reviewed  [medications reviewed and compliance discussed. Discussed medication change regarding discontinuation of lisinopril and addition of amlodipine.]              SDOH assessments and interventions completed:  Yes  SDOH Interventions Today    Flowsheet Row Most Recent Value  SDOH Interventions   Food Insecurity Interventions Intervention Not Indicated  Housing Interventions Intervention Not Indicated  Transportation Interventions Intervention Not Indicated        Care Coordination Interventions:  Yes, provided   Follow up plan: Follow up call scheduled for 02/12/23    Encounter Outcome:  Pt. Visit Completed   George Ina RN,BSN,CCM [facility] Care Coordination [phone] direct line

## 2023-01-14 NOTE — Patient Instructions (Addendum)
Visit Information  Thank you for taking time to visit with me today. Please don't hesitate to contact me if I can be of assistance to you.   Following are the goals we discussed today:  [Advised to continue to monitor blood pressure daily and record. Avoid drinking large amounts of liquid before going to bed or out in public. Avoid or reduce how much caffeine or alcohol you drink. Give yourself time when you urinate.]  Review education article on high blood pressure and benign prostate hyperplasia sent to you in the mail Notify your provider for any new or ongoing symptoms.  Continue to monitor blood pressure daily    Our next appointment is by telephone on 02/12/23 at 2:30 pm  Please call the care guide team at [phone] if you need to cancel or reschedule your appointment.   If you are experiencing a Mental Health or Behavioral Health Crisis or need someone to talk to, please call the Suicide and Crisis Lifeline: 988 call 1-800-273-TALK (toll free, 24 hour hotline)  The patient verbalized understanding of instructions, educational materials, and care plan provided today and agreed to receive a mailed copy of patient instructions, educational materials, and care plan.   George Ina RN,BSN,CCM [facility] Care Coordination [phone] direct line  Managing Your Hypertension Hypertension, also called high blood pressure, is when the force of the blood pressing against the walls of the arteries is too strong. Arteries are blood vessels that carry blood from your heart throughout your body. Hypertension forces the heart to work harder to pump blood and may cause the arteries to become narrow or stiff. Understanding blood pressure readings A blood pressure reading includes a higher number over a lower number: The first, or top, number is called the systolic pressure. It is a measure of the pressure in your arteries as your heart beats. The second, or bottom number, is called the diastolic  pressure. It is a measure of the pressure in your arteries as the heart relaxes. For most people, a normal blood pressure is below 120/80. Your personal target blood pressure may vary depending on your medical conditions, your age, and other factors. Blood pressure is classified into four stages. Based on your blood pressure reading, your health care provider may use the following stages to determine what type of treatment you need, if any. Systolic pressure and diastolic pressure are measured in a unit called millimeters of mercury (mmHg). Normal Systolic pressure: below 120. Diastolic pressure: below 80. Elevated Systolic pressure: 120-129. Diastolic pressure: below 80. Hypertension stage 1 Systolic pressure: 130-139. Diastolic pressure: 80-89. Hypertension stage 2 Systolic pressure: 140 or above. Diastolic pressure: 90 or above. How can this condition affect me? Managing your hypertension is very important. Over time, hypertension can damage the arteries and decrease blood flow to parts of the body, including the brain, heart, and kidneys. Having untreated or uncontrolled hypertension can lead to: A heart attack. A stroke. A weakened blood vessel (aneurysm). Heart failure. Kidney damage. Eye damage. Memory and concentration problems. Vascular dementia. What actions can I take to manage this condition? Hypertension can be managed by making lifestyle changes and possibly by taking medicines. Your health care provider will help you make a plan to bring your blood pressure within a normal range. You may be referred for counseling on a healthy diet and physical activity. Nutrition  Eat a diet that is high in fiber and potassium, and low in salt (sodium), added sugar, and fat. An example eating plan is called  the DASH diet. DASH stands for Dietary Approaches to Stop Hypertension. To eat this way: Eat plenty of fresh fruits and vegetables. Try to fill one-half of your plate at each meal  with fruits and vegetables. Eat whole grains, such as whole-wheat pasta, brown rice, or whole-grain bread. Fill about one-fourth of your plate with whole grains. Eat low-fat dairy products. Avoid fatty cuts of meat, processed or cured meats, and poultry with skin. Fill about one-fourth of your plate with lean proteins such as fish, chicken without skin, beans, eggs, and tofu. Avoid pre-made and processed foods. These tend to be higher in sodium, added sugar, and fat. Reduce your daily sodium intake. Many people with hypertension should eat less than 1,500 mg of sodium a day. Lifestyle  Work with your health care provider to maintain a healthy body weight or to lose weight. Ask what an ideal weight is for you. Get at least 30 minutes of exercise that causes your heart to beat faster (aerobic exercise) most days of the week. Activities may include walking, swimming, or biking. Include exercise to strengthen your muscles (resistance exercise), such as weight lifting, as part of your weekly exercise routine. Try to do these types of exercises for 30 minutes at least 3 days a week. Do not use any products that contain nicotine or tobacco. These products include cigarettes, chewing tobacco, and vaping devices, such as e-cigarettes. If you need help quitting, ask your health care provider. Control any long-term (chronic) conditions you have, such as high cholesterol or diabetes. Identify your sources of stress and find ways to manage stress. This may include meditation, deep breathing, or making time for fun activities. Alcohol use Do not drink alcohol if: Your health care provider tells you not to drink. You are pregnant, may be pregnant, or are planning to become pregnant. If you drink alcohol: Limit how much you have to: 0-1 drink a day for women. 0-2 drinks a day for men. Know how much alcohol is in your drink. In the [place] one drink equals one 12 oz bottle of beer (355 mL), one 5 oz glass of wine  (148 mL), or one 1 oz glass of hard liquor (44 mL). Medicines Your health care provider may prescribe medicine if lifestyle changes are not enough to get your blood pressure under control and if: Your systolic blood pressure is 130 or higher. Your diastolic blood pressure is 80 or higher. Take medicines only as told by your health care provider. Follow the directions carefully. Blood pressure medicines must be taken as told by your health care provider. The medicine does not work as well when you skip doses. Skipping doses also puts you at risk for problems. Monitoring Before you monitor your blood pressure: Do not smoke, drink caffeinated beverages, or exercise within 30 minutes before taking a measurement. Use the bathroom and empty your bladder (urinate). Sit quietly for at least 5 minutes before taking measurements. Monitor your blood pressure at home as told by your health care provider. To do this: Sit with your back straight and supported. Place your feet flat on the floor. Do not cross your legs. Support your arm on a flat surface, such as a table. Make sure your upper arm is at heart level. Each time you measure, take two or three readings one minute apart and record the results. You may also need to have your blood pressure checked regularly by your health care provider. General information Talk with your health care provider about your  diet, exercise habits, and other lifestyle factors that may be contributing to hypertension. Review all the medicines you take with your health care provider because there may be side effects or interactions. Keep all follow-up visits. Your health care provider can help you create and adjust your plan for managing your high blood pressure. Where to find more information National Heart, Lung, and Blood Institute: PopSteam.is American Heart Association: www.heart.org Contact a health care provider if: You think you are having a reaction to  medicines you have taken. You have repeated (recurrent) headaches. You feel dizzy. You have swelling in your ankles. You have trouble with your vision. Get help right away if: You develop a severe headache or confusion. You have unusual weakness or numbness, or you feel faint. You have severe pain in your chest or abdomen. You vomit repeatedly. You have trouble breathing. These symptoms may be an emergency. Get help right away. Call 911. Do not wait to see if the symptoms will go away. Do not drive yourself to the hospital. Summary Hypertension is when the force of blood pumping through your arteries is too strong. If this condition is not controlled, it may put you at risk for serious complications. Your personal target blood pressure may vary depending on your medical conditions, your age, and other factors. For most people, a normal blood pressure is less than 120/80. Hypertension is managed by lifestyle changes, medicines, or both. Lifestyle changes to help manage hypertension include losing weight, eating a healthy, low-sodium diet, exercising more, stopping smoking, and limiting alcohol. This information is not intended to replace advice given to you by your health care provider. Make sure you discuss any questions you have with your health care provider. Document Revised: 05/10/2021 Document Reviewed: 05/10/2021 Elsevier Patient Education  2023 Elsevier Inc.   Benign Prostatic Hyperplasia  Benign prostatic hyperplasia (BPH) is an enlarged prostate gland that is caused by the normal aging process. The prostate may get bigger as a man gets older. The condition is not caused by cancer. The prostate is a walnut-sized gland that is involved in the production of semen. It is located in front of the rectum and below the bladder. The bladder stores urine. The urethra carries stored urine out of the body. An enlarged prostate can press on the urethra. This can make it harder to pass urine.  The buildup of urine in the bladder can cause infection. Back pressure and infection may progress to bladder damage and kidney (renal) failure. What are the causes? This condition is part of the normal aging process. However, not all men develop problems from this condition. If the prostate enlarges away from the urethra, urine flow will not be blocked. If it enlarges toward the urethra and compresses it, there will be problems passing urine. What increases the risk? This condition is more likely to develop in men older than 50 years. What are the signs or symptoms? Symptoms of this condition include: Getting up often during the night to urinate. Needing to urinate frequently during the day. Difficulty starting urine flow. Decrease in size and strength of your urine stream. Leaking (dribbling) after urinating. Inability to pass urine. This needs immediate treatment. Inability to completely empty your bladder. Pain when you pass urine. This is more common if there is also an infection. Urinary tract infection (UTI). How is this diagnosed? This condition is diagnosed based on your medical history, a physical exam, and your symptoms. Tests will also be done, such as: A post-void bladder scan.  This measures any amount of urine that may remain in your bladder after you finish urinating. A digital rectal exam. In a rectal exam, your health care provider checks your prostate by putting a lubricated, gloved finger into your rectum to feel the back of your prostate gland. This exam detects the size of your gland and any abnormal lumps or growths. An exam of your urine (urinalysis). A prostate specific antigen (PSA) screening. This is a blood test used to screen for prostate cancer. An ultrasound. This test uses sound waves to electronically produce a picture of your prostate gland. Your health care provider may refer you to a specialist in kidney and prostate diseases (urologist). How is this  treated? Once symptoms begin, your health care provider will monitor your condition (active surveillance or watchful waiting). Treatment for this condition will depend on the severity of your condition. Treatment may include: Observation and yearly exams. This may be the only treatment needed if your condition and symptoms are mild. Medicines to relieve your symptoms, including: Medicines to shrink the prostate. Medicines to relax the muscle of the prostate. Surgery in severe cases. Surgery may include: Prostatectomy. In this procedure, the prostate tissue is removed completely through an open incision or with a laparoscope or robotics. Transurethral resection of the prostate (TURP). In this procedure, a tool is inserted through the opening at the tip of the penis (urethra). It is used to cut away tissue of the inner core of the prostate. The pieces are removed through the same opening of the penis. This removes the blockage. Transurethral incision (TUIP). In this procedure, small cuts are made in the prostate. This lessens the prostate's pressure on the urethra. Transurethral microwave thermotherapy (TUMT). This procedure uses microwaves to create heat. The heat destroys and removes a small amount of prostate tissue. Transurethral needle ablation (TUNA). This procedure uses radio frequencies to destroy and remove a small amount of prostate tissue. Interstitial laser coagulation (ILC). This procedure uses a laser to destroy and remove a small amount of prostate tissue. Transurethral electrovaporization (TUVP). This procedure uses electrodes to destroy and remove a small amount of prostate tissue. Prostatic urethral lift. This procedure inserts an implant to push the lobes of the prostate away from the urethra. Follow these instructions at home: Take over-the-counter and prescription medicines only as told by your health care provider. Monitor your symptoms for any changes. Contact your health care  provider with any changes. Avoid drinking large amounts of liquid before going to bed or out in public. Avoid or reduce how much caffeine or alcohol you drink. Give yourself time when you urinate. Keep all follow-up visits. This is important. Contact a health care provider if: You have unexplained back pain. Your symptoms do not get better with treatment. You develop side effects from the medicine you are taking. Your urine becomes very dark or has a bad smell. Your lower abdomen becomes distended and you have trouble passing urine. Get help right away if: You have a fever or chills. You suddenly cannot urinate. You feel light-headed or very dizzy, or you faint. There are large amounts of blood or clots in your urine. Your urinary problems become hard to manage. You develop moderate to severe low back or flank pain. The flank is the side of your body between the ribs and the hip. These symptoms may be an emergency. Get help right away. Call 911. Do not wait to see if the symptoms will go away. Do not drive yourself to  the hospital. Summary Benign prostatic hyperplasia (BPH) is an enlarged prostate that is caused by the normal aging process. It is not caused by cancer. An enlarged prostate can press on the urethra. This can make it hard to pass urine. This condition is more likely to develop in men older than 50 years. Get help right away if you suddenly cannot urinate. This information is not intended to replace advice given to you by your health care provider. Make sure you discuss any questions you have with your health care provider. Document Revised: 03/14/2021 Document Reviewed: 03/14/2021 Elsevier Patient Education  2023 [employer].

## 2023-01-27 ENCOUNTER — Telehealth: Payer: Self-pay | Admitting: Family Medicine

## 2023-01-27 NOTE — Telephone Encounter (Signed)
Spoke with [name] and advised her that we increased his medication on 4/23 due to hospital taking him off lisinopril. She stated the itching started on his neck and in his ears about 2 weeks after the increase. [name] wasn't sure if that's related or not.

## 2023-01-27 NOTE — Telephone Encounter (Signed)
Patient daughter [name] [name] called in and wanted clarification on her dads amLODipine (NORVASC) 5 MG tablet. She stated the bottle says take 1 tablet but he was informed to take 1.5-2 tablets daily. She also stated that he has been itching and not sure if its from the amlodipine or the kidney disease. Please advise. Thank you!

## 2023-01-27 NOTE — Telephone Encounter (Signed)
I wouldn't expect this to be med or kidney related but we can recheck him in clinic. Please offer that.  Thanks.

## 2023-01-27 NOTE — Telephone Encounter (Signed)
[name] [name] has been advised and offered OV; they are going to try and use some cortisone on this first and see if she can get it under control. Will call back if OV is needed.

## 2023-01-29 ENCOUNTER — Ambulatory Visit (INDEPENDENT_AMBULATORY_CARE_PROVIDER_SITE_OTHER): Payer: Medicare Other | Admitting: Urology

## 2023-01-29 ENCOUNTER — Encounter: Payer: Self-pay | Admitting: Urology

## 2023-01-29 VITALS — BP 137/68 | HR 69

## 2023-01-29 DIAGNOSIS — N138 Other obstructive and reflux uropathy: Secondary | ICD-10-CM

## 2023-01-29 DIAGNOSIS — R300 Dysuria: Secondary | ICD-10-CM

## 2023-01-29 DIAGNOSIS — N401 Enlarged prostate with lower urinary tract symptoms: Secondary | ICD-10-CM

## 2023-01-29 DIAGNOSIS — R339 Retention of urine, unspecified: Secondary | ICD-10-CM

## 2023-01-29 LAB — BLADDER SCAN AMB NON-IMAGING

## 2023-01-29 MED ORDER — SILODOSIN 8 MG PO CAPS: 8.0000 mg | ORAL_CAPSULE | Freq: Every day | ORAL | 3 refills | Status: DC

## 2023-01-29 MED ORDER — HYDROXYZINE HCL 10 MG PO TABS: 10.0000 mg | ORAL_TABLET | Freq: Three times a day (TID) | ORAL | 5 refills | Status: DC | PRN

## 2023-01-29 MED ORDER — FINASTERIDE 5 MG PO TABS
5.0000 mg | ORAL_TABLET | Freq: Every day | ORAL | 3 refills | Status: AC
Start: 2023-01-29 — End: ?

## 2023-01-29 NOTE — Progress Notes (Unsigned)
01/29/2023 3:13 PM   [name] [name] [birth year] [id number]  Referring provider: Joaquim Nam, MD [address] [place],  [place] [address]  Chief Complaint  Patient presents with   Urinary Retention    HPI: [name] [name] is a 89yo here for followup for BPH with a weak urinary stream. IPSS 7 QOl 2 on rapaflo. He has a weaker stream. NO straining to urinate. Nocturia 1-2x. No urinary hesitancy. Dysuria resolved. No other complaints today. PVR 131cc   PMH: Past Medical History:  Diagnosis Date   Acute renal failure (HCC) 12/06/2013   Back pain    occasionally   Benign essential tremor 2005   takes Primidone daily   Choledocholithiasis 12/06/2013   Constipation    takes OTC stool softener   Coronary artery disease 2007   s/p CABG, DR Eden Emms   GERD (gastroesophageal reflux disease)    takes Nexium daily   Gout 1995   "~ once/yr" (03/17/2014)   History of colon polyps    Hyperlipidemia 12/2005   takes Atorvastatin daily   Hypertension 1995   takes Lisinopril and Coreg daily   possible HCAP (healthcare-associated pneumonia) 12/09/2013   Prostate cancer (HCC) 2007   S/P treatment, followed by Urology prev   Right bundle branch block (RBBB)    Stenosis of cervical spine    Type II diabetes mellitus (HCC)    no meds;diet and exercise controlled    Wears dentures    Wears glasses     Surgical History: Past Surgical History:  Procedure Laterality Date   Adenosine myoview  01/24/2006   Ischemia by EKG, Cath   CATARACT EXTRACTION W/ INTRAOCULAR LENS IMPLANT Left 10/2013   CHOLECYSTECTOMY N/A 12/06/2013   Procedure: Diagnostic Laparoscopy for  drainage Intrabdominal abcess;  Surgeon: Emelia Loron, MD;  Location: [facility] OR;  Service: General;  Laterality: N/A;   CHOLECYSTECTOMY N/A 03/17/2014   Procedure: LAPAROSCOPIC CHOLECYSTECTOMY ;  Surgeon: Emelia Loron, MD;  Location: [facility] OR;  Service: General;  Laterality: N/A;   COLONOSCOPY     CORONARY ARTERY BYPASS  GRAFT  2007   CABGX 4   CYSTOSCOPY  02/15/2004   Mod BPH, moder tribec ?   ERCP N/A 12/06/2013   Procedure: ENDOSCOPIC RETROGRADE CHOLANGIOPANCREATOGRAPHY (ERCP);  Surgeon: Rachael Fee, MD;  Location: [facility] OR;  Service: Endoscopy;  Laterality: N/A;   LAPAROSCOPIC CHOLECYSTECTOMY  03/17/2014   LUMBAR FUSION  02/2012   lumbar spine for spinal stenosis   POSTERIOR CERVICAL FUSION/FORAMINOTOMY N/A 05/01/2020   Procedure: Posterior cervical fusion with lateral mass fixation - Cervical three - Cervical six, laminectomy Cervical three-six;  Surgeon: Tia Alert, MD;  Location: [facility] OR;  Service: Neurosurgery;  Laterality: N/A;   PROSTATE CRYOABLATION  09/18/2005   prostate CA    Home Medications:  Allergies as of 01/29/2023   No Known Allergies      Medication List        Accurate as of Jan 29, 2023  3:13 PM. If you have any questions, ask your nurse or doctor.          STOP taking these medications    amoxicillin 500 MG capsule Commonly known as: AMOXIL Stopped by: Wilkie Aye, MD       TAKE these medications    acetaminophen 500 MG tablet Commonly known as: TYLENOL Take 2 tablets (1,000 mg total) by mouth 3 (three) times daily as needed for mild pain or moderate pain.   allopurinol 300 MG tablet Commonly known  as: ZYLOPRIM TAKE 1 TABLET DAILY   amLODipine 5 MG tablet Commonly known as: NORVASC Take 1.5-2 tablets (7.5-10 mg total) by mouth daily.   aspirin 81 MG tablet Take 81 mg by mouth daily.   atorvastatin 40 MG tablet Commonly known as: LIPITOR TAKE 1 TABLET DAILY   carvedilol 20 MG 24 hr capsule Commonly known as: COREG CR TAKE 1 CAPSULE DAILY   folic acid 1 MG tablet Commonly known as: FOLVITE Take 1 tablet (1 mg total) by mouth daily.   melatonin 3 MG Tabs tablet Take 1 tablet (3 mg total) by mouth at bedtime.   omeprazole 40 MG capsule Commonly known as: PRILOSEC TAKE 1 CAPSULE DAILY AS NEEDED   primidone 50 MG tablet Commonly known  as: MYSOLINE TAKE ONE-HALF (1/2) TABLET DAILY   senna-docusate 8.6-50 MG tablet Commonly known as: Senokot-S Take 1-2 tablets by mouth daily.   silodosin 8 MG Caps capsule Commonly known as: RAPAFLO Take 1 capsule (8 mg total) by mouth at bedtime.   traZODone 50 MG tablet Commonly known as: DESYREL TAKE 1 TABLET AT BEDTIME        Allergies: No Known Allergies  Family History: Family History  Problem Relation Age of Onset   Heart disease Mother    Cancer Mother    Cancer Sister    Diabetes Sister    Cancer Brother        prostate   Prostate cancer Brother    Hypothyroidism Brother    Depression Neg Hx    Alcohol abuse Neg Hx    Drug abuse Neg Hx    Stroke Neg Hx    Colon cancer Neg Hx     Social History:  reports that he has never smoked. He has never used smokeless tobacco. He reports that he does not drink alcohol and does not use drugs.  ROS: All other review of systems were reviewed and are negative except what is noted above in HPI  Physical Exam: BP 137/68   Pulse 69   Constitutional:  Alert and oriented, No acute distress. HEENT: [place] AT, moist mucus membranes.  Trachea midline, no masses. Cardiovascular: No clubbing, cyanosis, or edema. Respiratory: Normal respiratory effort, no increased work of breathing. GI: Abdomen is soft, nontender, nondistended, no abdominal masses GU: No CVA tenderness.  Lymph: No cervical or inguinal lymphadenopathy. Skin: No rashes, bruises or suspicious lesions. Neurologic: Grossly intact, no focal deficits, moving all 4 extremities. Psychiatric: Normal mood and affect.  Laboratory Data: Lab Results  Component Value Date   WBC 6.9 01/06/2023   HGB 12.2 (L) 01/06/2023   HCT 35.8 (L) 01/06/2023   MCV 98.4 01/06/2023   PLT 272.0 01/06/2023    Lab Results  Component Value Date   CREATININE 1.66 (H) 01/06/2023    Lab Results  Component Value Date   PSA 4.93 (H) 09/13/2022   PSA 3.99 01/18/2020   PSA 3.96  01/04/2019    No results found for: "TESTOSTERONE"  Lab Results  Component Value Date   HGBA1C 6.1 09/13/2022    Urinalysis    Component Value Date/Time   COLORURINE YELLOW 12/27/2022 1539   APPEARANCEUR CLEAR 12/27/2022 1539   APPEARANCEUR Cloudy (A) 12/24/2022 1209   LABSPEC 1.009 12/27/2022 1539   PHURINE 6.0 12/27/2022 1539   GLUCOSEU NEGATIVE 12/27/2022 1539   HGBUR NEGATIVE 12/27/2022 1539   BILIRUBINUR NEGATIVE 12/27/2022 1539   BILIRUBINUR Negative 12/24/2022 1209   KETONESUR NEGATIVE 12/27/2022 1539   PROTEINUR 100 (A) 12/27/2022 1539  UROBILINOGEN 0.2 10/10/2021 1453   UROBILINOGEN 0.2 01/07/2014 1044   NITRITE NEGATIVE 12/27/2022 1539   LEUKOCYTESUR SMALL (A) 12/27/2022 1539    Lab Results  Component Value Date   LABMICR See below: 12/24/2022   WBCUA >30 (A) 12/24/2022   LABEPIT 0-10 12/24/2022   BACTERIA RARE (A) 12/27/2022    Pertinent Imaging:  No results found for this or any previous visit.  No results found for this or any previous visit.  No results found for this or any previous visit.  No results found for this or any previous visit.  No results found for this or any previous visit.  No valid procedures specified. No results found for this or any previous visit.  No results found for this or any previous visit.   Assessment & Plan:    1. Dysuria resolved - Urinalysis, Routine w reflex microscopic - BLADDER SCAN AMB NON-IMAGING  2. Urinary retention Continue rapaflo 8mg  daily and start finasteride 5mg  daily  3. Benign prostatic hyperplasia with urinary obstruction Rapaflo 8mg  daily and finasteride 5mg  daily   No follow-ups on file.  Wilkie Aye, MD  [facility] [place]

## 2023-01-30 ENCOUNTER — Encounter: Payer: Self-pay | Admitting: Urology

## 2023-01-30 NOTE — Patient Instructions (Signed)

## 2023-02-04 ENCOUNTER — Other Ambulatory Visit: Payer: Self-pay

## 2023-02-04 ENCOUNTER — Ambulatory Visit
Admission: EM | Admit: 2023-02-04 | Discharge: 2023-02-04 | Disposition: A | Discharge status: Home / Self Care | Payer: Medicare Other | Attending: Nurse Practitioner | Admitting: Nurse Practitioner

## 2023-02-04 ENCOUNTER — Encounter: Payer: Self-pay | Admitting: Emergency Medicine

## 2023-02-04 ENCOUNTER — Telehealth: Payer: Self-pay | Admitting: Family Medicine

## 2023-02-04 ENCOUNTER — Ambulatory Visit: Payer: Self-pay

## 2023-02-04 DIAGNOSIS — R21 Rash and other nonspecific skin eruption: Secondary | ICD-10-CM

## 2023-02-04 HISTORY — DX: Basal cell carcinoma of skin of left ear and external auricular canal: C44.219

## 2023-02-04 HISTORY — DX: Hypertensive chronic kidney disease with stage 1 through stage 4 chronic kidney disease, or unspecified chronic kidney disease: I12.9

## 2023-02-04 HISTORY — DX: Chronic kidney disease, stage 3b: N18.32

## 2023-02-04 MED ORDER — TRIAMCINOLONE ACETONIDE 0.1 % EX OINT: 1.0000 | TOPICAL_OINTMENT | Freq: Two times a day (BID) | CUTANEOUS | 0 refills | Status: DC

## 2023-02-04 MED ORDER — METHYLPREDNISOLONE SODIUM SUCC 125 MG IJ SOLR
60.0000 mg | Freq: Once | INTRAMUSCULAR | Status: AC
  Administered 2023-02-04: 60 mg via INTRAMUSCULAR

## 2023-02-04 NOTE — Telephone Encounter (Signed)
Pt's daughter, [name], called stating the pt's BP meds was recently changed due to the issues with the pt's kidneys. [name] stated the pt has now developed whelps & red spots on arm. [name] stated the believes it could be due to the the pt's blood possibly being thin. [name] is asking does Para March think the BP meds need to be changed again? Call back # [phone]

## 2023-02-04 NOTE — ED Provider Notes (Signed)
[facility]    CSN: [id number] Arrival date & time: 02/04/23  1452      History   Chief Complaint Chief Complaint  Patient presents with   Rash    HPI [name] [name] is a 89 y.o. male.   Patient presents today with daughter for purple discoloration around an itchy rash that has been ongoing for the past month since blood pressure medication was changed from lisinopril to amlodipine.  Daughter states they are concerned about the purple circles around the rash that have newly developed in the past couple of days.  No fever, cough, congestion.  No shortness of breath, throat or tongue swelling, or difficulty swallowing.  Patient reports he has been intense itch on his arms, chest, back, and head.  No rash on the lower extremities.  No recent change in detergents, soaps, personal care products.  Only recent change was a blood pressure medication and that occurred about a month ago shortly before the itching started.  Has been taking Benadryl and hydroxyzine which helps with the itching temporarily.    Past Medical History:  Diagnosis Date   Acute renal failure (HCC) 12/06/2013   Back pain    occasionally   Basal cell carcinoma (BCC) of antihelix of left ear    Benign essential tremor 2005   takes Primidone daily   Choledocholithiasis 12/06/2013   Constipation    takes OTC stool softener   Coronary artery disease 2007   s/p CABG, DR Eden Emms   GERD (gastroesophageal reflux disease)    takes Nexium daily   Gout 1995   "~ once/yr" (03/17/2014)   History of colon polyps    Hyperlipidemia 12/2005   takes Atorvastatin daily   Hypertension 1995   takes Lisinopril and Coreg daily   possible HCAP (healthcare-associated pneumonia) 12/09/2013   Prostate cancer (HCC) 2007   S/P treatment, followed by Urology prev   Right bundle branch block (RBBB)    stage 3b chronic kidney disease (HCC)    Stenosis of cervical spine    Type II diabetes mellitus (HCC)    no meds;diet  and exercise controlled    Wears dentures    Wears glasses     Patient Active Problem List   Diagnosis Date Noted   Dysuria 01/08/2023   BPH (benign prostatic hyperplasia) 12/28/2022   Failure to thrive in adult 12/28/2022   Skin lesion 08/21/2022   History of prostate cancer 10/10/2021   Acute on chronic urinary retention 10/10/2021   Cystitis 10/10/2021   DNR (do not resuscitate) 06/25/2021   Constipation 07/10/2020   Sleep disturbance 07/10/2020   Abnormality of gait 07/10/2020   Insomnia 06/12/2020   Acute renal failure superimposed on stage 3b chronic kidney disease (HCC)    Hyponatremia    Hypoalbuminemia due to protein-calorie malnutrition (HCC)    Transaminitis    Acute on chronic anemia    Stenosis of cervical spine with myelopathy (HCC) 05/04/2020   S/P cervical spinal fusion 05/01/2020   History of agent Orange exposure 04/05/2020   Paresthesia 01/20/2020   Health care maintenance 12/28/2017   GERD (gastroesophageal reflux disease) 12/28/2017   Medicare annual wellness visit, subsequent 10/30/2014   Advance care planning 10/30/2014   UTI (urinary tract infection) 04/19/2014   Right bundle branch block (RBBB)    Wheezing 12/09/2013   CKD (chronic kidney disease), stage III (HCC) 12/06/2013   Back pain 10/20/2011   Hypothyroidism 03/23/2009   UNSPECIFIED VITAMIN D DEFICIENCY 03/23/2009   Tremor  12/18/2006   CAD (coronary artery disease) 12/18/2006   Mixed hyperlipidemia 12/08/2005   History of diabetes mellitus 02/07/2005   Gout 09/09/1993   Essential hypertension 09/09/1993    Past Surgical History:  Procedure Laterality Date   Adenosine myoview  01/24/2006   Ischemia by EKG, Cath   CATARACT EXTRACTION W/ INTRAOCULAR LENS IMPLANT Left 10/2013   CHOLECYSTECTOMY N/A 12/06/2013   Procedure: Diagnostic Laparoscopy for  drainage Intrabdominal abcess;  Surgeon: Emelia Loron, MD;  Location: [facility] OR;  Service: General;  Laterality: N/A;   CHOLECYSTECTOMY N/A  03/17/2014   Procedure: LAPAROSCOPIC CHOLECYSTECTOMY ;  Surgeon: Emelia Loron, MD;  Location: [facility] OR;  Service: General;  Laterality: N/A;   COLONOSCOPY     CORONARY ARTERY BYPASS GRAFT  2007   CABGX 4   CYSTOSCOPY  02/15/2004   Mod BPH, moder tribec ?   ERCP N/A 12/06/2013   Procedure: ENDOSCOPIC RETROGRADE CHOLANGIOPANCREATOGRAPHY (ERCP);  Surgeon: Rachael Fee, MD;  Location: [facility] OR;  Service: Endoscopy;  Laterality: N/A;   LAPAROSCOPIC CHOLECYSTECTOMY  03/17/2014   LUMBAR FUSION  02/2012   lumbar spine for spinal stenosis   POSTERIOR CERVICAL FUSION/FORAMINOTOMY N/A 05/01/2020   Procedure: Posterior cervical fusion with lateral mass fixation - Cervical three - Cervical six, laminectomy Cervical three-six;  Surgeon: Tia Alert, MD;  Location: [facility] OR;  Service: Neurosurgery;  Laterality: N/A;   PROSTATE CRYOABLATION  09/18/2005   prostate CA       Home Medications    Prior to Admission medications   Medication Sig Start Date End Date Taking? Authorizing Provider  triamcinolone ointment (KENALOG) 0.1 % Apply 1 Application topically 2 (two) times daily. Apply small amount twice daily to clean skin.  Do not use for more than 14 days in a row. 02/04/23  Yes Valentino Nose, NP  acetaminophen (TYLENOL) 500 MG tablet Take 2 tablets (1,000 mg total) by mouth 3 (three) times daily as needed for mild pain or moderate pain. 06/06/20   Joaquim Nam, MD  allopurinol (ZYLOPRIM) 300 MG tablet TAKE 1 TABLET DAILY 05/28/22   Joaquim Nam, MD  amLODipine (NORVASC) 5 MG tablet Take 1.5-2 tablets (7.5-10 mg total) by mouth daily. 01/10/23   Joaquim Nam, MD  aspirin 81 MG tablet Take 81 mg by mouth daily.    [provider]  atorvastatin (LIPITOR) 40 MG tablet TAKE 1 TABLET DAILY 05/28/22   Joaquim Nam, MD  carvedilol (COREG CR) 20 MG 24 hr capsule TAKE 1 CAPSULE DAILY 11/08/22   Joaquim Nam, MD  finasteride (PROSCAR) 5 MG tablet Take 1 tablet (5 mg total) by mouth daily.  01/29/23   McKenzie, Mardene Celeste, MD  folic acid (FOLVITE) 1 MG tablet Take 1 tablet (1 mg total) by mouth daily. 12/31/22   Catarina Hartshorn, MD  hydrOXYzine (ATARAX) 10 MG tablet Take 1 tablet (10 mg total) by mouth every 8 (eight) hours as needed. 01/29/23   McKenzie, Mardene Celeste, MD  melatonin 3 MG TABS tablet Take 1 tablet (3 mg total) by mouth at bedtime. 05/17/20   Love, Evlyn Kanner, PA-C  omeprazole (PRILOSEC) 40 MG capsule TAKE 1 CAPSULE DAILY AS NEEDED 01/14/22   Joaquim Nam, MD  primidone (MYSOLINE) 50 MG tablet TAKE ONE-HALF (1/2) TABLET DAILY 05/28/22   Joaquim Nam, MD  senna-docusate (SENOKOT-S) 8.6-50 MG tablet Take 1-2 tablets by mouth daily. 06/22/21   Joaquim Nam, MD  silodosin (RAPAFLO) 8 MG CAPS capsule Take 1 capsule (  8 mg total) by mouth at bedtime. 01/29/23   Malen Gauze, MD  traZODone (DESYREL) 50 MG tablet TAKE 1 TABLET AT BEDTIME 11/08/22   Joaquim Nam, MD    Family History Family History  Problem Relation Age of Onset   Heart disease Mother    Cancer Mother    Cancer Sister    Diabetes Sister    Cancer Brother        prostate   Prostate cancer Brother    Hypothyroidism Brother    Depression Neg Hx    Alcohol abuse Neg Hx    Drug abuse Neg Hx    Stroke Neg Hx    Colon cancer Neg Hx     Social History Social History   Tobacco Use   Smoking status: Never   Smokeless tobacco: Never  Vaping Use   Vaping Use: Never used  Substance Use Topics   Alcohol use: No   Drug use: No     Allergies   Patient has no known allergies.   Review of Systems Review of Systems Per HPI  Physical Exam Triage Vital Signs ED Triage Vitals  Enc Vitals Group     BP 02/04/23 1558 (!) 157/79     Pulse Rate 02/04/23 1558 (!) 56     Resp 02/04/23 1558 20     Temp 02/04/23 1558 98 F (36.7 C)     Temp Source 02/04/23 1558 Oral     SpO2 02/04/23 1558 95 %     Weight --      Height --      Head Circumference --      Peak Flow --      Pain Score 02/04/23 1556  0     Pain Loc --      Pain Edu? --      Excl. in GC? --    No data found.  Updated Vital Signs BP (!) 157/79 (BP Location: Right Arm)   Pulse (!) 56   Temp 98 F (36.7 C) (Oral)   Resp 20   SpO2 95%   Visual Acuity Right Eye Distance:   Left Eye Distance:   Bilateral Distance:    Right Eye Near:   Left Eye Near:    Bilateral Near:     Physical Exam Vitals and nursing note reviewed.  Constitutional:      General: He is not in acute distress.    Appearance: Normal appearance. He is not toxic-appearing.  HENT:     Head: Normocephalic and atraumatic.     Mouth/Throat:     Mouth: Mucous membranes are moist.     Pharynx: Oropharynx is clear.  Pulmonary:     Effort: Pulmonary effort is normal. No respiratory distress.  Skin:    General: Skin is warm and dry.     Capillary Refill: Capillary refill takes less than 2 seconds.     Coloration: Skin is not jaundiced or pale.     Comments: Scattered purpura to bilateral upper extremities surrounding excoriated areas; there are excoriated areas to bilateral upper arms and low back.  No obvious rash visualized.  No warmth, oozing, active drainage.  Neurological:     Mental Status: He is alert and oriented to person, place, and time.  Psychiatric:        Behavior: Behavior is cooperative.      UC Treatments / Results  Labs (all labs ordered are listed, but only abnormal results are displayed) Labs Reviewed - No  data to display  EKG   Radiology No results found.  Procedures Procedures (including critical care time)  Medications Ordered in UC Medications  methylPREDNISolone sodium succinate (SOLU-MEDROL) 125 mg/2 mL injection 60 mg (60 mg Intramuscular Given 02/04/23 1633)    Initial Impression / Assessment and Plan / UC Course  I have reviewed the triage vital signs and the nursing notes.  Pertinent labs & imaging results that were available during my care of the patient were reviewed by me and considered in my  medical decision making (see chart for details).   Patient is well-appearing, afebrile, not tachycardic, not tachypneic, oxygenating well on room air.  Patient is mildly hypertensive today in triage.  1. Rash and nonspecific skin eruption Reassurance provided that purpura is likely secondary to itching at the skin Unclear etiology for the rash-I do suspect this may be a drug allergy, however thae does not include lower extremities and therefore not completely clear Patient has been taking hydroxyzine and Benadryl with very slight improvement in itching Start triamcinolone ointment Solu-Medrol 60 mg IM given today in urgent care for inflammation from rash Recommended follow-up with PCP for ongoing monitoring of the rash   The patient was given the opportunity to ask questions.  All questions answered to their satisfaction.  The patient is in agreement to this plan.    Final Clinical Impressions(s) / UC Diagnoses   Final diagnoses:  Rash and nonspecific skin eruption     Discharge Instructions      The purple spots on your arm are called purpura.  This is a little bit of bruising under the skin that comes from probably scratching at your arms.  I am not sure what is causing the rash today.  It seems like it is something that is coming into contact with your skin.  We have given you a shot of steroid called Solu-Medrol today to help with inflammation.  Continue Benadryl and hydroxyzine as needed for itching.  You can also start using the triamcinolone ointment twice daily in small quantities to the itchy areas which should help with the itching.  Recommend follow-up with PCP to discuss rash.    ED Prescriptions     Medication Sig Dispense Auth. Provider   triamcinolone ointment (KENALOG) 0.1 % Apply 1 Application topically 2 (two) times daily. Apply small amount twice daily to clean skin.  Do not use for more than 14 days in a row. 30 g Valentino Nose, NP      PDMP not reviewed  this encounter.   Valentino Nose, NP 02/04/23 1757

## 2023-02-04 NOTE — Telephone Encounter (Signed)
Unable to reach [name] (DPR signed) on phone; left v/m requesting [name] to call [phone].

## 2023-02-04 NOTE — Discharge Instructions (Signed)
The purple spots on your arm are called purpura.  This is a little bit of bruising under the skin that comes from probably scratching at your arms.  I am not sure what is causing the rash today.  It seems like it is something that is coming into contact with your skin.  We have given you a shot of steroid called Solu-Medrol today to help with inflammation.  Continue Benadryl and hydroxyzine as needed for itching.  You can also start using the triamcinolone ointment twice daily in small quantities to the itchy areas which should help with the itching.  Recommend follow-up with PCP to discuss rash.

## 2023-02-04 NOTE — Telephone Encounter (Signed)
[name] [name] called stating she was going to take the pt to a urgent care in [place] instead of the [place] [facility] in [place]. Call back # [phone]

## 2023-02-04 NOTE — Telephone Encounter (Signed)
Noted. Thanks.  Please get update on patient on Wednesday.

## 2023-02-04 NOTE — Telephone Encounter (Signed)
FYI: This call has been transferred to Access Nurse. Once the result note has been entered staff can address the message at that time.  Patient called in with the following symptoms:  Red Word: whelts/ red spots after taking medication    Please advise at Mobile [phone] (mobile)  Message is routed to Provider Pool and [facility] Triage

## 2023-02-04 NOTE — ED Triage Notes (Signed)
Pt family reports "purple" rash to [facility]UE and back for last several days. Called pcp and was told to be seen today but unable to be seen in office. Pt family reports recently started amlodipine and is wondering if two could be related.  Has tried hydroxyzine with no change in itching.

## 2023-02-04 NOTE — Telephone Encounter (Signed)
I spoke with [name] [facility] signed) and she said since no appts at [facility] she is going to take pt to [facility] and [name] will cb on 02/05/23 in AM with update on how pt is doing. Sending note to Dr Para March and Para March pool.

## 2023-02-05 NOTE — Telephone Encounter (Signed)
Called and spoke with patients daughter [name] [name] to check on the patient. She said they went to UC yesterday and was given steroid shot and TAC cream to use at home. UC told them he has contact dermatitis and to f/u with PCP. Was also told to take hydroxyzine during the day and benadryl at night. [name] [name] stated that patient is doing well today; that the shot is working well. They have not picked up his TAC cream yet but will today. Patient is not itchy today. Follow up appt has been made for 02/10/23 at 9:00 am.

## 2023-02-10 ENCOUNTER — Encounter: Payer: Self-pay | Admitting: Family Medicine

## 2023-02-10 ENCOUNTER — Ambulatory Visit (INDEPENDENT_AMBULATORY_CARE_PROVIDER_SITE_OTHER): Payer: Medicare Other | Admitting: Family Medicine

## 2023-02-10 VITALS — BP 116/60 | HR 60 | Temp 97.9°F | Ht 71.0 in | Wt 172.0 lb

## 2023-02-10 DIAGNOSIS — I10 Essential (primary) hypertension: Secondary | ICD-10-CM

## 2023-02-10 LAB — BASIC METABOLIC PANEL
BUN: 33 mg/dL — ABNORMAL HIGH (ref 6–23)
CO2: 26 mEq/L (ref 19–32)
Calcium: 9.7 mg/dL (ref 8.4–10.5)
Chloride: 103 mEq/L (ref 96–112)
Creatinine, Ser: 1.7 mg/dL — ABNORMAL HIGH (ref 0.40–1.50)
GFR: 34.68 mL/min — ABNORMAL LOW (ref >60.00)
Glucose, Bld: 151 mg/dL — ABNORMAL HIGH (ref 70–99)
Potassium: 4.4 mEq/L (ref 3.5–5.1)
Sodium: 138 mEq/L (ref 135–145)

## 2023-02-10 MED ORDER — AMLODIPINE BESYLATE 5 MG PO TABS: ORAL_TABLET | ORAL | Status: DC

## 2023-02-10 NOTE — Patient Instructions (Signed)
Stop amlodipine for now.  Go to the lab on the way out.   If you have mychart we'll likely use that to update you.    Take care.  Glad to see you.

## 2023-02-10 NOTE — Progress Notes (Unsigned)
He has pending MOHS clinic for skin lesion on L ear.    Rash started after amlodipine start.  Prev given steroid injection and hydroxyzine.  Still taking hydroxyzine and benadryl prn for itching- no ADE on med other than fatigue on med.  No lip or tongue swelling.    Itching predates rash, and that started after amlodipine start.      See about restart lisinopril vs clonidine after recheck Cr today.  Stop amlodipine in the meantime.     No acute rash but old excoriations from scratching on the B upper arms.   Rrr Ctab Abd soft, not ttp No BLE edema.     May need recheck labs done here at [facility]

## 2023-02-12 ENCOUNTER — Ambulatory Visit: Payer: Self-pay

## 2023-02-12 ENCOUNTER — Other Ambulatory Visit: Payer: Self-pay | Admitting: Family Medicine

## 2023-02-12 DIAGNOSIS — I10 Essential (primary) hypertension: Secondary | ICD-10-CM

## 2023-02-12 MED ORDER — LISINOPRIL 10 MG PO TABS
10.0000 mg | ORAL_TABLET | Freq: Every day | ORAL | 3 refills | Status: DC
Start: 2023-02-12 — End: 2023-08-13

## 2023-02-12 NOTE — Patient Outreach (Signed)
  Care Coordination   Follow Up Visit Note   02/12/2023 Name: [name] [name] MRN: [id number] DOB: 1929/05/02  [name] [name] is a 89 y.o. year old male who sees [name] Nam, MD for primary care. I spoke with  [name] [name]  and daughter, [name] [name] by phone today.  What matters to the patients health and wellness today?  Patient/ daughter states patient is having issues with itching constantly. Daughter states patient was recently seen in ED for this and had a follow up visit with his PCP on 02/10/23.   Patient/ daughter state PCP put his new blood pressure medication amlodipine on hold to see if this is causing the issues.  Daughter states patient is also waiting for follow up call from primary provider regarding Creatinine results.  Patient states he is monitoring his blood pressures. He reports today's BP was 134/78 and yesterday was 133/75.   Goals Addressed             This Visit's Progress    Management/ education of health conditions.       Interventions Today    Flowsheet Row Most Recent Value  Chronic Disease   Chronic disease during today's visit Hypertension (HTN), Other  [BPH/ Urinary retention, itching]  General Interventions   General Interventions Discussed/Reviewed General Interventions Reviewed, Doctor Visits  [evaluation of current treatment plan for HTN, BPH, itching and patients adherence to plan as established by provider. Assessed for ongoing BPH symptoms. Assessed for BP readings.]  Doctor Visits Discussed/Reviewed Doctor Visits Reviewed  Annabell Sabal upcoming/ scheduled provider visits]  Education Interventions   Education Provided Provided Education  [Encouraged patient to continue monitoring BP and recording since provider put BP medication on hold.]  Pharmacy Interventions   Pharmacy Dicussed/Reviewed Pharmacy Topics Reviewed  [medications reviewed and compliance discussed.  Confirmed patient using medication to help with itching.]               SDOH assessments and interventions completed:  No     Care Coordination Interventions:  Yes, provided   Follow up plan: Follow up call scheduled for 03/20/23    Encounter Outcome:  Pt. Visit Completed   George Ina RN,BSN,CCM [facility] Care Coordination [phone] direct line

## 2023-02-12 NOTE — Assessment & Plan Note (Signed)
Stop amlodipine for now. This could be causing itching.  No airway sx.  No stridor or lip swelling.  We can see about restart lisinopril vs trial of clonidine after recheck Cr today. May need recheck labs done here at [facility]

## 2023-02-20 ENCOUNTER — Other Ambulatory Visit (INDEPENDENT_AMBULATORY_CARE_PROVIDER_SITE_OTHER): Payer: Medicare Other

## 2023-02-20 DIAGNOSIS — I10 Essential (primary) hypertension: Secondary | ICD-10-CM | POA: Diagnosis not present

## 2023-02-20 LAB — BASIC METABOLIC PANEL
BUN: 27 mg/dL — ABNORMAL HIGH (ref 6–23)
CO2: 26 mEq/L (ref 19–32)
Calcium: 9.5 mg/dL (ref 8.4–10.5)
Chloride: 104 mEq/L (ref 96–112)
Creatinine, Ser: 1.66 mg/dL — ABNORMAL HIGH (ref 0.40–1.50)
GFR: 35.67 mL/min — ABNORMAL LOW (ref >60.00)
Glucose, Bld: 109 mg/dL — ABNORMAL HIGH (ref 70–99)
Potassium: 4.7 mEq/L (ref 3.5–5.1)
Sodium: 138 mEq/L (ref 135–145)

## 2023-02-23 ENCOUNTER — Other Ambulatory Visit: Payer: Self-pay | Admitting: Family Medicine

## 2023-03-25 ENCOUNTER — Ambulatory Visit: Payer: Self-pay

## 2023-03-25 NOTE — Patient Outreach (Signed)
  Care Coordination   Follow Up Visit Note   03/25/2023 Name: [name] [name] MRN: [id number] DOB: 28-Aug-1929  [name] [name] is a 89 y.o. year old male who sees Joaquim Nam, MD for primary care. I spoke with  Ann Lions by phone today.  What matters to the patients health and wellness today?  Patient states he continues to have itching but not as bad.  He reports taking OTC to manage.  Patient states he has not restarted the amlodipine.  Patient reports blood pressure today was 141/78.  Denies checking BP regularly.  Patient reports having cancer to left ear. He reports biopsy area has healed and he is  waiting for appointment for surgical removal.     Goals Addressed             This Visit's Progress    Management/ education of health conditions.       Interventions Today    Flowsheet Row Most Recent Value  Chronic Disease   Chronic disease during today's visit Hypertension (HTN), Other  [itching]  General Interventions   General Interventions Discussed/Reviewed General Interventions Reviewed, Doctor Visits  [evaluation of current treatment plan for HTN/ itching and patients adherence to plan as established by provider. Assessed for BP reading and ongoing itching.]  Doctor Visits Discussed/Reviewed Doctor Visits Reviewed  [reviewed scheduled / upcoming providers. Advised to continue follow up with providers as recommended.]  Education Interventions   Education Provided Provided Education  [advised to continue monitoring BP at least 2-3 times per week and record. Advised to notify provider of BP readings outside of established parameters]  Pharmacy Interventions   Pharmacy Dicussed/Reviewed Pharmacy Topics Reviewed  [medications reviewed and compliance discussed.]              SDOH assessments and interventions completed:  No     Care Coordination Interventions:  Yes, provided   Follow up plan: Follow up call scheduled for 05/19/23    Encounter  Outcome:  Pt. Visit Completed   George Ina RN,BSN,CCM [facility] Care Coordination [phone] direct line

## 2023-03-25 NOTE — Patient Instructions (Signed)
Visit Information  Thank you for taking time to visit with me today. Please don't hesitate to contact me if I can be of assistance to you.   Following are the goals we discussed today:   Goals Addressed             This Visit's Progress    Management/ education of health conditions.       Interventions Today    Flowsheet Row Most Recent Value  Chronic Disease   Chronic disease during today's visit Hypertension (HTN), Other  [itching]  General Interventions   General Interventions Discussed/Reviewed General Interventions Reviewed, Doctor Visits  [evaluation of current treatment plan for HTN/ itching and patients adherence to plan as established by provider. Assessed for BP reading and ongoing itching.]  Doctor Visits Discussed/Reviewed Doctor Visits Reviewed  [reviewed scheduled / upcoming providers. Advised to continue follow up with providers as recommended.]  Education Interventions   Education Provided Provided Education  [advised to continue monitoring BP at least 2-3 times per week and record. Advised to notify provider of BP readings outside of established parameters]  Pharmacy Interventions   Pharmacy Dicussed/Reviewed Pharmacy Topics Reviewed  [medications reviewed and compliance discussed.]              Our next appointment is by telephone on 05/19/23 at 3 pm  Please call the care guide team at [phone] if you need to cancel or reschedule your appointment.   If you are experiencing a Mental Health or Behavioral Health Crisis or need someone to talk to, please call the Suicide and Crisis Lifeline: 988 call 1-800-273-TALK (toll free, 24 hour hotline)  The patient verbalized understanding of instructions, educational materials, and care plan provided today and agreed to receive a mailed copy of patient instructions, educational materials, and care plan.   George Ina RN,BSN,CCM [facility] Care Coordination [phone] direct line

## 2023-04-03 ENCOUNTER — Other Ambulatory Visit: Payer: Self-pay | Admitting: Family Medicine

## 2023-05-07 ENCOUNTER — Other Ambulatory Visit: Payer: Self-pay | Admitting: Family Medicine

## 2023-05-13 ENCOUNTER — Telehealth: Payer: Self-pay | Admitting: Family Medicine

## 2023-05-13 NOTE — Telephone Encounter (Signed)
Mc.Carter daughter  [name] called  and stated that [facility] called and said he was out of refills and could the nurse give her a call back she can be reached at [phone]. [name] also stated that she not sure which medication he needs a refill on but its eight different meditations

## 2023-05-14 MED ORDER — CARVEDILOL PHOSPHATE ER 20 MG PO CP24: 20.0000 mg | ORAL_CAPSULE | Freq: Every day | ORAL | 3 refills | Status: DC

## 2023-05-14 MED ORDER — ALLOPURINOL 300 MG PO TABS: 300.0000 mg | ORAL_TABLET | Freq: Every day | ORAL | 3 refills | Status: DC

## 2023-05-14 NOTE — Telephone Encounter (Signed)
Spoke with patients daughter and she stated that carvedilol and allopurinol need to be ref[facility]. Erxs sent.

## 2023-05-14 NOTE — Addendum Note (Signed)
Addended by: Wendie Simmer B on: 05/14/2023 04:40 PM   Modules accepted: Orders

## 2023-05-14 NOTE — Telephone Encounter (Signed)
LMTCB

## 2023-05-19 ENCOUNTER — Ambulatory Visit: Payer: Self-pay

## 2023-05-19 NOTE — Patient Instructions (Signed)
Visit Information  Thank you for taking time to visit with me today. Please don't hesitate to contact me if I can be of assistance to you.   Following are the goals we discussed today:   Goals Addressed             This Visit's Progress    COMPLETED: Management/ education of health conditions.       Interventions Today    Flowsheet Row Most Recent Value  Chronic Disease   Chronic disease during today's visit Hypertension (HTN)  General Interventions   General Interventions Discussed/Reviewed General Interventions Reviewed, Doctor Visits  [evaluation of current treatment plan  for HTN and patients adherence to plan as establsied.  Assessed for BP readings]  Doctor Visits Discussed/Reviewed Doctor Visits Reviewed  [facility] upcoming provider visits.  Advised to keep follow up appointments with provider.  Confirmed patient has contact phone number for RN care manager for future reference if needed.]  Education Interventions   Education Provided Provided Education  [Advised to continue monitoring BP daily and recording. Advised to notify provider for BP readings outside of normal parameters.  Education article sent to patient on managing HTN]  Pharmacy Interventions   Pharmacy Dicussed/Reviewed Pharmacy Topics Reviewed  [medications reviewed and compliance discussed]              Please contact your primary care provider if care coordination services needed in the future.   If you are experiencing a Mental Health or Behavioral Health Crisis or need someone to talk to, please call the Suicide and Crisis Lifeline: 988 call 1-800-273-TALK (toll free, 24 hour hotline)  The patient verbalized understanding of instructions, educational materials, and care plan provided today and agreed to receive a mailed copy of patient instructions, educational materials, and care plan.   George Ina RN,BSN,CCM [facility] Care Coordination [phone] direct line  Managing Your  Hypertension Hypertension, also called high blood pressure, is when the force of the blood pressing against the walls of the arteries is too strong. Arteries are blood vessels that carry blood from your heart throughout your body. Hypertension forces the heart to work harder to pump blood and may cause the arteries to become narrow or stiff. Understanding blood pressure readings A blood pressure reading includes a higher number over a lower number: The first, or top, number is called the systolic pressure. It is a measure of the pressure in your arteries as your heart beats. The second, or bottom number, is called the diastolic pressure. It is a measure of the pressure in your arteries as the heart relaxes. For most people, a normal blood pressure is below 120/80. Your personal target blood pressure may vary depending on your medical conditions, your age, and other factors. Blood pressure is classified into four stages. Based on your blood pressure reading, your health care provider may use the following stages to determine what type of treatment you need, if any. Systolic pressure and diastolic pressure are measured in a unit called millimeters of mercury (mmHg). Normal Systolic pressure: below 120. Diastolic pressure: below 80. Elevated Systolic pressure: 120-129. Diastolic pressure: below 80. Hypertension stage 1 Systolic pressure: 130-139. Diastolic pressure: 80-89. Hypertension stage 2 Systolic pressure: 140 or above. Diastolic pressure: 90 or above. How can this condition affect me? Managing your hypertension is very important. Over time, hypertension can damage the arteries and decrease blood flow to parts of the body, including the brain, heart, and kidneys. Having untreated or uncontrolled hypertension can lead to: A heart  attack. A stroke. A weakened blood vessel (aneurysm). Heart failure. Kidney damage. Eye damage. Memory and concentration problems. Vascular dementia. What  actions can I take to manage this condition? Hypertension can be managed by making lifestyle changes and possibly by taking medicines. Your health care provider will help you make a plan to bring your blood pressure within a normal range. You may be referred for counseling on a healthy diet and physical activity. Nutrition  Eat a diet that is high in fiber and potassium, and low in salt (sodium), added sugar, and fat. An example eating plan is called the DASH diet. DASH stands for Dietary Approaches to Stop Hypertension. To eat this way: Eat plenty of fresh fruits and vegetables. Try to fill one-half of your plate at each meal with fruits and vegetables. Eat whole grains, such as whole-wheat pasta, brown rice, or whole-grain bread. Fill about one-fourth of your plate with whole grains. Eat low-fat dairy products. Avoid fatty cuts of meat, processed or cured meats, and poultry with skin. Fill about one-fourth of your plate with lean proteins such as fish, chicken without skin, beans, eggs, and tofu. Avoid pre-made and processed foods. These tend to be higher in sodium, added sugar, and fat. Reduce your daily sodium intake. Many people with hypertension should eat less than 1,500 mg of sodium a day. Lifestyle  Work with your health care provider to maintain a healthy body weight or to lose weight. Ask what an ideal weight is for you. Get at least 30 minutes of exercise that causes your heart to beat faster (aerobic exercise) most days of the week. Activities may include walking, swimming, or biking. Include exercise to strengthen your muscles (resistance exercise), such as weight lifting, as part of your weekly exercise routine. Try to do these types of exercises for 30 minutes at least 3 days a week. Do not use any products that contain nicotine or tobacco. These products include cigarettes, chewing tobacco, and vaping devices, such as e-cigarettes. If you need help quitting, ask your health care  provider. Control any long-term (chronic) conditions you have, such as high cholesterol or diabetes. Identify your sources of stress and find ways to manage stress. This may include meditation, deep breathing, or making time for fun activities. Alcohol use Do not drink alcohol if: Your health care provider tells you not to drink. You are pregnant, may be pregnant, or are planning to become pregnant. If you drink alcohol: Limit how much you have to: 0-1 drink a day for women. 0-2 drinks a day for men. Know how much alcohol is in your drink. In the [place] one drink equals one 12 oz bottle of beer (355 mL), one 5 oz glass of wine (148 mL), or one 1 oz glass of hard liquor (44 mL). Medicines Your health care provider may prescribe medicine if lifestyle changes are not enough to get your blood pressure under control and if: Your systolic blood pressure is 130 or higher. Your diastolic blood pressure is 80 or higher. Take medicines only as told by your health care provider. Follow the directions carefully. Blood pressure medicines must be taken as told by your health care provider. The medicine does not work as well when you skip doses. Skipping doses also puts you at risk for problems. Monitoring Before you monitor your blood pressure: Do not smoke, drink caffeinated beverages, or exercise within 30 minutes before taking a measurement. Use the bathroom and empty your bladder (urinate). Sit quietly for at least 5  minutes before taking measurements. Monitor your blood pressure at home as told by your health care provider. To do this: Sit with your back straight and supported. Place your feet flat on the floor. Do not cross your legs. Support your arm on a flat surface, such as a table. Make sure your upper arm is at heart level. Each time you measure, take two or three readings one minute apart and record the results. You may also need to have your blood pressure checked regularly by your health  care provider. General information Talk with your health care provider about your diet, exercise habits, and other lifestyle factors that may be contributing to hypertension. Review all the medicines you take with your health care provider because there may be side effects or interactions. Keep all follow-up visits. Your health care provider can help you create and adjust your plan for managing your high blood pressure. Where to find more information National Heart, Lung, and Blood Institute: PopSteam.is American Heart Association: www.heart.org Contact a health care provider if: You think you are having a reaction to medicines you have taken. You have repeated (recurrent) headaches. You feel dizzy. You have swelling in your ankles. You have trouble with your vision. Get help right away if: You develop a severe headache or confusion. You have unusual weakness or numbness, or you feel faint. You have severe pain in your chest or abdomen. You vomit repeatedly. You have trouble breathing. These symptoms may be an emergency. Get help right away. Call 911. Do not wait to see if the symptoms will go away. Do not drive yourself to the hospital. Summary Hypertension is when the force of blood pumping through your arteries is too strong. If this condition is not controlled, it may put you at risk for serious complications. Your personal target blood pressure may vary depending on your medical conditions, your age, and other factors. For most people, a normal blood pressure is less than 120/80. Hypertension is managed by lifestyle changes, medicines, or both. Lifestyle changes to help manage hypertension include losing weight, eating a healthy, low-sodium diet, exercising more, stopping smoking, and limiting alcohol. This information is not intended to replace advice given to you by your health care provider. Make sure you discuss any questions you have with your health care  provider. Document Revised: 05/10/2021 Document Reviewed: 05/10/2021 Elsevier Patient Education  2024 [employer].

## 2023-05-19 NOTE — Patient Outreach (Signed)
  Care Coordination   Follow Up Visit Note   05/19/2023 Name: [name] [name] MRN: [id number] DOB: 07/28/1929  [name] [name] is a 89 y.o. year old male who sees Joaquim Nam, MD for primary care. I spoke with  Ann Lions by phone today.  What matters to the patients health and wellness today?  Patient states he is doing well. He reports blood pressure readings range from 140/70's to 150/70's.  He states he has a follow up visits scheduled with provider at the [place] on 05/22/23.  Patient states his left ear has healed from biopsy. Patient denies any new symptoms or concerns at this time.  Patient agreeable that care coordination goals have been met.    Goals Addressed             This Visit's Progress    COMPLETED: Management/ education of health conditions.       Interventions Today    Flowsheet Row Most Recent Value  Chronic Disease   Chronic disease during today's visit Hypertension (HTN)  General Interventions   General Interventions Discussed/Reviewed General Interventions Reviewed, Doctor Visits  [evaluation of current treatment plan  for HTN and patients adherence to plan as establsied.  Assessed for BP readings]  Doctor Visits Discussed/Reviewed Doctor Visits Reviewed  [facility] upcoming provider visits.  Advised to keep follow up appointments with provider.  Confirmed patient has contact phone number for RN care manager for future reference if needed.]  Education Interventions   Education Provided Provided Education  [Advised to continue monitoring BP daily and recording. Advised to notify provider for BP readings outside of normal parameters.  Education article sent to patient on managing HTN]  Pharmacy Interventions   Pharmacy Dicussed/Reviewed Pharmacy Topics Reviewed  [medications reviewed and compliance discussed]              SDOH assessments and interventions completed:  No     Care Coordination Interventions:  Yes, provided   Follow up plan:  No further intervention required.   Encounter Outcome:  Patient Visit Completed   George Ina [facility] Care Coordination [phone] direct line

## 2023-06-10 ENCOUNTER — Encounter: Payer: Self-pay | Admitting: Family Medicine

## 2023-06-10 ENCOUNTER — Ambulatory Visit (INDEPENDENT_AMBULATORY_CARE_PROVIDER_SITE_OTHER): Payer: Medicare Other | Admitting: Family Medicine

## 2023-06-10 VITALS — BP 158/70 | HR 67 | Temp 97.6°F | Ht 71.0 in | Wt 171.0 lb

## 2023-06-10 DIAGNOSIS — Z77098 Contact with and (suspected) exposure to other hazardous, chiefly nonmedicinal, chemicals: Secondary | ICD-10-CM | POA: Diagnosis not present

## 2023-06-10 DIAGNOSIS — R5383 Other fatigue: Secondary | ICD-10-CM | POA: Diagnosis not present

## 2023-06-10 DIAGNOSIS — Z23 Encounter for immunization: Secondary | ICD-10-CM | POA: Diagnosis not present

## 2023-06-10 DIAGNOSIS — Z8546 Personal history of malignant neoplasm of prostate: Secondary | ICD-10-CM | POA: Diagnosis not present

## 2023-06-10 DIAGNOSIS — Z7739 Contact with and (suspected) exposure to other war theater: Secondary | ICD-10-CM

## 2023-06-10 DIAGNOSIS — N183 Chronic kidney disease, stage 3 unspecified: Secondary | ICD-10-CM

## 2023-06-10 DIAGNOSIS — D649 Anemia, unspecified: Secondary | ICD-10-CM | POA: Diagnosis not present

## 2023-06-10 LAB — CBC WITH DIFFERENTIAL/PLATELET
Basophils Absolute: 0 10*3/uL (ref 0.0–0.1)
Basophils Relative: 0.5 % (ref 0.0–3.0)
Eosinophils Absolute: 0.2 10*3/uL (ref 0.0–0.7)
Eosinophils Relative: 3.1 % (ref 0.0–5.0)
HCT: 39.7 % (ref 39.0–52.0)
Hemoglobin: 13.1 g/dL (ref 13.0–17.0)
Lymphocytes Relative: 27.7 % (ref 12.0–46.0)
Lymphs Abs: 1.8 10*3/uL (ref 0.7–4.0)
MCHC: 33.1 g/dL (ref 30.0–36.0)
MCV: 102.2 fL — ABNORMAL HIGH (ref 78.0–100.0)
Monocytes Absolute: 0.6 10*3/uL (ref 0.1–1.0)
Monocytes Relative: 9.4 % (ref 3.0–12.0)
Neutro Abs: 3.8 10*3/uL (ref 1.4–7.7)
Neutrophils Relative %: 59.3 % (ref 43.0–77.0)
Platelets: 204 10*3/uL (ref 150.0–400.0)
RBC: 3.88 Mil/uL — ABNORMAL LOW (ref 4.22–5.81)
RDW: 14.6 % (ref 11.5–15.5)
WBC: 6.5 10*3/uL (ref 4.0–10.5)

## 2023-06-10 LAB — TSH: TSH: 5.26 u[IU]/mL (ref 0.35–5.50)

## 2023-06-10 LAB — IRON: Iron: 88 ug/dL (ref 42–165)

## 2023-06-10 LAB — VITAMIN B12: Vitamin B-12: 376 pg/mL (ref 211–911)

## 2023-06-10 MED ORDER — FINASTERIDE 5 MG PO TABS: 5.0000 mg | ORAL_TABLET | Freq: Every day | ORAL | 3 refills | Status: DC

## 2023-06-10 NOTE — Patient Instructions (Signed)
Let me work on Medical illustrator for the [place].  Go to the lab on the way out.   If you have mychart we'll likely use that to update you.    Take care.  Glad to see you.

## 2023-06-10 NOTE — Progress Notes (Unsigned)
He got a letter from the [place].  He has h/o agent orange exposure.  He had CKD and h/o urinary incontinence with use of 5 or more pads per day.    Fatigue noted.  Gradually worse over the last 6 months.  Appetite is lower.  He has as lack of motivation.  Mood d/w pt. No SI/HI.    He wasn't able to get finasteride filled.  Was on med but not now.  Rx resent.   H/o itching, hydroxyzine didn't help.  Discussed that I would not expect this to be related to his current level of renal dysfunction.  Using walker at baseline, gait abnormality.  Discussed HHPT, that may be useful.  He'll consider.  He can update me if he wants to get that set up.  Meds, vitals, and allergies reviewed.   ROS: Per HPI unless specifically indicated in ROS section   Nad Ncat with postsurgical changes noted on the left pinna. Neck supple, no LA Rrr Ctab Abdomen soft.  Nontender. Skin well-perfused. Extremities without edema.

## 2023-06-11 ENCOUNTER — Encounter: Payer: Self-pay | Admitting: Family Medicine

## 2023-06-11 DIAGNOSIS — R5383 Other fatigue: Secondary | ICD-10-CM | POA: Insufficient documentation

## 2023-06-11 NOTE — Assessment & Plan Note (Signed)
He has a history of renal dysfunction. GFR of 34 most recently in September 2024 on labs at the [place].  This is similar to previous GFR values in April and June of this year at 52, 34, 35.  He also has a history of prostate cancer with subsequent urinary incontinence with use of 5 or more pads per day.

## 2023-06-11 NOTE — Assessment & Plan Note (Signed)
See history of agent orange exposure discussion.

## 2023-06-11 NOTE — Assessment & Plan Note (Signed)
See history of agent orange exposure discussion.  Recent labs discussed with patient.

## 2023-06-11 NOTE — Assessment & Plan Note (Signed)
Of unclear source.  Could be multifactorial.  Reasonable to recheck labs today before we make any changes.  Discussed that his mood could be affected by chronic medical conditions or his mood could also affect his energy level/fatigue.  At this point still okay for outpatient follow-up.  44 minutes were devoted to patient care in this encounter (this includes time spent reviewing the patient's file/history, interviewing and examining the patient, counseling/reviewing plan with patient).

## 2023-06-12 ENCOUNTER — Encounter: Payer: Self-pay | Admitting: Family Medicine

## 2023-06-18 ENCOUNTER — Other Ambulatory Visit: Payer: Self-pay | Admitting: Family Medicine

## 2023-06-18 DIAGNOSIS — R269 Unspecified abnormalities of gait and mobility: Secondary | ICD-10-CM

## 2023-06-18 MED ORDER — MIRTAZAPINE 7.5 MG PO TABS
7.5000 mg | ORAL_TABLET | Freq: Every day | ORAL | Status: DC
Start: 2023-06-18 — End: 2023-06-18

## 2023-06-18 MED ORDER — MIRTAZAPINE 7.5 MG PO TABS: 7.5000 mg | ORAL_TABLET | Freq: Every day | ORAL | 1 refills | Status: DC

## 2023-06-23 DIAGNOSIS — M1990 Unspecified osteoarthritis, unspecified site: Secondary | ICD-10-CM | POA: Diagnosis not present

## 2023-06-23 DIAGNOSIS — Z9181 History of falling: Secondary | ICD-10-CM | POA: Diagnosis not present

## 2023-06-23 DIAGNOSIS — N183 Chronic kidney disease, stage 3 unspecified: Secondary | ICD-10-CM | POA: Diagnosis not present

## 2023-06-23 DIAGNOSIS — I2510 Atherosclerotic heart disease of native coronary artery without angina pectoris: Secondary | ICD-10-CM | POA: Diagnosis not present

## 2023-06-23 DIAGNOSIS — E1122 Type 2 diabetes mellitus with diabetic chronic kidney disease: Secondary | ICD-10-CM | POA: Diagnosis not present

## 2023-06-23 DIAGNOSIS — R32 Unspecified urinary incontinence: Secondary | ICD-10-CM | POA: Diagnosis not present

## 2023-06-23 DIAGNOSIS — Z8546 Personal history of malignant neoplasm of prostate: Secondary | ICD-10-CM | POA: Diagnosis not present

## 2023-06-23 DIAGNOSIS — Z87440 Personal history of urinary (tract) infections: Secondary | ICD-10-CM | POA: Diagnosis not present

## 2023-06-23 DIAGNOSIS — I129 Hypertensive chronic kidney disease with stage 1 through stage 4 chronic kidney disease, or unspecified chronic kidney disease: Secondary | ICD-10-CM | POA: Diagnosis not present

## 2023-06-23 DIAGNOSIS — D631 Anemia in chronic kidney disease: Secondary | ICD-10-CM | POA: Diagnosis not present

## 2023-07-01 DIAGNOSIS — I2510 Atherosclerotic heart disease of native coronary artery without angina pectoris: Secondary | ICD-10-CM | POA: Diagnosis not present

## 2023-07-01 DIAGNOSIS — D631 Anemia in chronic kidney disease: Secondary | ICD-10-CM | POA: Diagnosis not present

## 2023-07-01 DIAGNOSIS — N183 Chronic kidney disease, stage 3 unspecified: Secondary | ICD-10-CM | POA: Diagnosis not present

## 2023-07-01 DIAGNOSIS — I129 Hypertensive chronic kidney disease with stage 1 through stage 4 chronic kidney disease, or unspecified chronic kidney disease: Secondary | ICD-10-CM | POA: Diagnosis not present

## 2023-07-01 DIAGNOSIS — E1122 Type 2 diabetes mellitus with diabetic chronic kidney disease: Secondary | ICD-10-CM | POA: Diagnosis not present

## 2023-07-01 DIAGNOSIS — M1990 Unspecified osteoarthritis, unspecified site: Secondary | ICD-10-CM | POA: Diagnosis not present

## 2023-07-08 DIAGNOSIS — I2510 Atherosclerotic heart disease of native coronary artery without angina pectoris: Secondary | ICD-10-CM | POA: Diagnosis not present

## 2023-07-08 DIAGNOSIS — D631 Anemia in chronic kidney disease: Secondary | ICD-10-CM | POA: Diagnosis not present

## 2023-07-08 DIAGNOSIS — N183 Chronic kidney disease, stage 3 unspecified: Secondary | ICD-10-CM | POA: Diagnosis not present

## 2023-07-08 DIAGNOSIS — I129 Hypertensive chronic kidney disease with stage 1 through stage 4 chronic kidney disease, or unspecified chronic kidney disease: Secondary | ICD-10-CM | POA: Diagnosis not present

## 2023-07-08 DIAGNOSIS — M1990 Unspecified osteoarthritis, unspecified site: Secondary | ICD-10-CM | POA: Diagnosis not present

## 2023-07-08 DIAGNOSIS — E1122 Type 2 diabetes mellitus with diabetic chronic kidney disease: Secondary | ICD-10-CM | POA: Diagnosis not present

## 2023-07-15 DIAGNOSIS — N183 Chronic kidney disease, stage 3 unspecified: Secondary | ICD-10-CM | POA: Diagnosis not present

## 2023-07-15 DIAGNOSIS — E1122 Type 2 diabetes mellitus with diabetic chronic kidney disease: Secondary | ICD-10-CM | POA: Diagnosis not present

## 2023-07-15 DIAGNOSIS — I129 Hypertensive chronic kidney disease with stage 1 through stage 4 chronic kidney disease, or unspecified chronic kidney disease: Secondary | ICD-10-CM | POA: Diagnosis not present

## 2023-07-15 DIAGNOSIS — I2510 Atherosclerotic heart disease of native coronary artery without angina pectoris: Secondary | ICD-10-CM | POA: Diagnosis not present

## 2023-07-15 DIAGNOSIS — M1990 Unspecified osteoarthritis, unspecified site: Secondary | ICD-10-CM | POA: Diagnosis not present

## 2023-07-15 DIAGNOSIS — D631 Anemia in chronic kidney disease: Secondary | ICD-10-CM | POA: Diagnosis not present

## 2023-07-23 DIAGNOSIS — E1122 Type 2 diabetes mellitus with diabetic chronic kidney disease: Secondary | ICD-10-CM | POA: Diagnosis not present

## 2023-07-23 DIAGNOSIS — I2510 Atherosclerotic heart disease of native coronary artery without angina pectoris: Secondary | ICD-10-CM | POA: Diagnosis not present

## 2023-07-23 DIAGNOSIS — Z87440 Personal history of urinary (tract) infections: Secondary | ICD-10-CM | POA: Diagnosis not present

## 2023-07-23 DIAGNOSIS — Z8546 Personal history of malignant neoplasm of prostate: Secondary | ICD-10-CM | POA: Diagnosis not present

## 2023-07-23 DIAGNOSIS — R32 Unspecified urinary incontinence: Secondary | ICD-10-CM | POA: Diagnosis not present

## 2023-07-23 DIAGNOSIS — I129 Hypertensive chronic kidney disease with stage 1 through stage 4 chronic kidney disease, or unspecified chronic kidney disease: Secondary | ICD-10-CM | POA: Diagnosis not present

## 2023-07-23 DIAGNOSIS — N183 Chronic kidney disease, stage 3 unspecified: Secondary | ICD-10-CM | POA: Diagnosis not present

## 2023-07-23 DIAGNOSIS — Z9181 History of falling: Secondary | ICD-10-CM | POA: Diagnosis not present

## 2023-07-23 DIAGNOSIS — D631 Anemia in chronic kidney disease: Secondary | ICD-10-CM | POA: Diagnosis not present

## 2023-07-23 DIAGNOSIS — M1990 Unspecified osteoarthritis, unspecified site: Secondary | ICD-10-CM | POA: Diagnosis not present

## 2023-07-24 DIAGNOSIS — N183 Chronic kidney disease, stage 3 unspecified: Secondary | ICD-10-CM | POA: Diagnosis not present

## 2023-07-24 DIAGNOSIS — M1990 Unspecified osteoarthritis, unspecified site: Secondary | ICD-10-CM | POA: Diagnosis not present

## 2023-07-24 DIAGNOSIS — I2510 Atherosclerotic heart disease of native coronary artery without angina pectoris: Secondary | ICD-10-CM | POA: Diagnosis not present

## 2023-07-24 DIAGNOSIS — E1122 Type 2 diabetes mellitus with diabetic chronic kidney disease: Secondary | ICD-10-CM | POA: Diagnosis not present

## 2023-07-24 DIAGNOSIS — I129 Hypertensive chronic kidney disease with stage 1 through stage 4 chronic kidney disease, or unspecified chronic kidney disease: Secondary | ICD-10-CM | POA: Diagnosis not present

## 2023-07-24 DIAGNOSIS — D631 Anemia in chronic kidney disease: Secondary | ICD-10-CM | POA: Diagnosis not present

## 2023-07-29 DIAGNOSIS — D631 Anemia in chronic kidney disease: Secondary | ICD-10-CM | POA: Diagnosis not present

## 2023-07-29 DIAGNOSIS — E1122 Type 2 diabetes mellitus with diabetic chronic kidney disease: Secondary | ICD-10-CM | POA: Diagnosis not present

## 2023-07-29 DIAGNOSIS — I2510 Atherosclerotic heart disease of native coronary artery without angina pectoris: Secondary | ICD-10-CM | POA: Diagnosis not present

## 2023-07-29 DIAGNOSIS — I129 Hypertensive chronic kidney disease with stage 1 through stage 4 chronic kidney disease, or unspecified chronic kidney disease: Secondary | ICD-10-CM | POA: Diagnosis not present

## 2023-07-29 DIAGNOSIS — N183 Chronic kidney disease, stage 3 unspecified: Secondary | ICD-10-CM | POA: Diagnosis not present

## 2023-07-29 DIAGNOSIS — M1990 Unspecified osteoarthritis, unspecified site: Secondary | ICD-10-CM | POA: Diagnosis not present

## 2023-08-06 ENCOUNTER — Ambulatory Visit: Payer: Medicare Other | Admitting: Urology

## 2023-08-06 VITALS — BP 187/80 | HR 59

## 2023-08-06 DIAGNOSIS — N138 Other obstructive and reflux uropathy: Secondary | ICD-10-CM | POA: Diagnosis not present

## 2023-08-06 DIAGNOSIS — N401 Enlarged prostate with lower urinary tract symptoms: Secondary | ICD-10-CM | POA: Diagnosis not present

## 2023-08-06 DIAGNOSIS — R339 Retention of urine, unspecified: Secondary | ICD-10-CM | POA: Diagnosis not present

## 2023-08-06 LAB — BLADDER SCAN AMB NON-IMAGING: Scan Result: 208

## 2023-08-06 NOTE — Progress Notes (Signed)
08/06/2023 12:12 PM   [name] [name] 1928/10/14 [id number]  Referring provider: Joaquim Nam, MD [address] [place],  [place] [address]  Followup BPH   HPI: [name] [name] is a 89yo here for followup for BPh with weak urianry stream and incomplete emptying. PVR 208cc. IPSS 19 QOL 3 on rapalfo 8mg  at bedtime. Urine stream fair. Nocturia 1-2x. No dysuria.    PMH: Past Medical History:  Diagnosis Date   Acute renal failure (HCC) 12/06/2013   Back pain    occasionally   Basal cell carcinoma (BCC) of antihelix of left ear    Benign essential tremor 2005   takes Primidone daily   Choledocholithiasis 12/06/2013   Constipation    takes OTC stool softener   Coronary artery disease 2007   s/p CABG, DR [name] [name]   GERD (gastroesophageal reflux disease)    takes Nexium daily   Gout 1995   "~ once/yr" (03/17/2014)   History of colon polyps    Hyperlipidemia 12/2005   takes Atorvastatin daily   Hypertension 1995   takes Lisinopril and Coreg daily   possible HCAP (healthcare-associated pneumonia) 12/09/2013   Prostate cancer (HCC) 2007   S/P treatment, followed by Urology prev   Right bundle branch block (RBBB)    stage 3b chronic kidney disease (HCC)    Stenosis of cervical spine    Type II diabetes mellitus (HCC)    no meds;diet and exercise controlled    Wears dentures    Wears glasses     Surgical History: Past Surgical History:  Procedure Laterality Date   Adenosine myoview  01/24/2006   Ischemia by EKG, Cath   CATARACT EXTRACTION W/ INTRAOCULAR LENS IMPLANT Left 10/2013   CHOLECYSTECTOMY N/A 12/06/2013   Procedure: Diagnostic Laparoscopy for  drainage Intrabdominal abcess;  Surgeon: [name] Loron, MD;  Location: [facility] OR;  Service: General;  Laterality: N/A;   CHOLECYSTECTOMY N/A 03/17/2014   Procedure: LAPAROSCOPIC CHOLECYSTECTOMY ;  Surgeon: [name] Loron, MD;  Location: [facility] OR;  Service: General;  Laterality: N/A;   COLONOSCOPY     CORONARY ARTERY  BYPASS GRAFT  2007   CABGX 4   CYSTOSCOPY  02/15/2004   Mod BPH, moder tribec ?   ERCP N/A 12/06/2013   Procedure: ENDOSCOPIC RETROGRADE CHOLANGIOPANCREATOGRAPHY (ERCP);  Surgeon: [name] Fee, MD;  Location: [facility] OR;  Service: Endoscopy;  Laterality: N/A;   LAPAROSCOPIC CHOLECYSTECTOMY  03/17/2014   LUMBAR FUSION  02/2012   lumbar spine for spinal stenosis   POSTERIOR CERVICAL FUSION/FORAMINOTOMY N/A 05/01/2020   Procedure: Posterior cervical fusion with lateral mass fixation - Cervical three - Cervical six, laminectomy Cervical three-six;  Surgeon: [name] Alert, MD;  Location: [facility] OR;  Service: Neurosurgery;  Laterality: N/A;   PROSTATE CRYOABLATION  09/18/2005   prostate CA    Home Medications:  Allergies as of 08/06/2023       Reactions   Amlodipine    Itching        Medication List        Accurate as of August 06, 2023 12:12 PM. If you have any questions, ask your nurse or doctor.          acetaminophen 500 MG tablet Commonly known as: TYLENOL Take 2 tablets (1,000 mg total) by mouth 3 (three) times daily as needed for mild pain or moderate pain.   allopurinol 300 MG tablet Commonly known as: ZYLOPRIM Take 1 tablet (300 mg total) by mouth daily.   aspirin 81 MG tablet  Take 81 mg by mouth daily.   atorvastatin 40 MG tablet Commonly known as: LIPITOR TAKE 1 TABLET DAILY   carvedilol 20 MG 24 hr capsule Commonly known as: COREG CR Take 1 capsule (20 mg total) by mouth daily.   finasteride 5 MG tablet Commonly known as: PROSCAR Take 1 tablet (5 mg total) by mouth daily.   folic acid 1 MG tablet Commonly known as: FOLVITE Take 1 tablet (1 mg total) by mouth daily.   lisinopril 10 MG tablet Commonly known as: ZESTRIL Take 1 tablet (10 mg total) by mouth daily.   melatonin 3 MG Tabs tablet Take 1 tablet (3 mg total) by mouth at bedtime.   mirtazapine 7.5 MG tablet Commonly known as: REMERON Take 1 tablet (7.5 mg total) by mouth at bedtime. Stop  trazodone with mirtazapine use.   omeprazole 40 MG capsule Commonly known as: PRILOSEC TAKE 1 CAPSULE DAILY AS NEEDED   primidone 50 MG tablet Commonly known as: MYSOLINE TAKE ONE-HALF (1/2) TABLET DAILY   senna-docusate 8.6-50 MG tablet Commonly known as: Senokot-S Take 1-2 tablets by mouth daily.   silodosin 8 MG Caps capsule Commonly known as: RAPAFLO Take 1 capsule (8 mg total) by mouth at bedtime.   triamcinolone ointment 0.1 % Commonly known as: KENALOG Apply 1 Application topically 2 (two) times daily. Apply small amount twice daily to clean skin.  Do not use for more than 14 days in a row.        Allergies:  Allergies  Allergen Reactions   Amlodipine     Itching    Family History: Family History  Problem Relation Age of Onset   Heart disease Mother    Cancer Mother    Cancer Sister    Diabetes Sister    Cancer Brother        prostate   Prostate cancer Brother    Hypothyroidism Brother    Depression Neg Hx    Alcohol abuse Neg Hx    Drug abuse Neg Hx    Stroke Neg Hx    Colon cancer Neg Hx     Social History:  reports that he has never smoked. He has never used smokeless tobacco. He reports that he does not drink alcohol and does not use drugs.  ROS: All other review of systems were reviewed and are negative except what is noted above in HPI  Physical Exam: There were no vitals taken for this visit.  Constitutional:  [name] and oriented, No acute distress. HEENT: [place] AT, moist mucus membranes.  Trachea midline, no masses. Cardiovascular: No clubbing, cyanosis, or edema. Respiratory: Normal respiratory effort, no increased work of breathing. GI: Abdomen is soft, nontender, nondistended, no abdominal masses GU: No CVA tenderness.  Lymph: No cervical or inguinal lymphadenopathy. Skin: No rashes, bruises or suspicious lesions. Neurologic: Grossly intact, no focal deficits, moving all 4 extremities. Psychiatric: Normal mood and affect.  Laboratory  Data: Lab Results  Component Value Date   WBC 6.5 06/10/2023   HGB 13.1 06/10/2023   HCT 39.7 06/10/2023   MCV 102.2 (H) 06/10/2023   PLT 204.0 06/10/2023    Lab Results  Component Value Date   CREATININE 1.66 (H) 02/20/2023    Lab Results  Component Value Date   PSA 4.93 (H) 09/13/2022   PSA 3.99 01/18/2020   PSA 3.96 01/04/2019    No results found for: "TESTOSTERONE"  Lab Results  Component Value Date   HGBA1C 6.1 09/13/2022    Urinalysis  Component Value Date/Time   COLORURINE YELLOW 12/27/2022 1539   APPEARANCEUR CLEAR 12/27/2022 1539   APPEARANCEUR Cloudy (A) 12/24/2022 1209   LABSPEC 1.009 12/27/2022 1539   PHURINE 6.0 12/27/2022 1539   GLUCOSEU NEGATIVE 12/27/2022 1539   HGBUR NEGATIVE 12/27/2022 1539   BILIRUBINUR NEGATIVE 12/27/2022 1539   BILIRUBINUR Negative 12/24/2022 1209   KETONESUR NEGATIVE 12/27/2022 1539   PROTEINUR 100 (A) 12/27/2022 1539   UROBILINOGEN 0.2 10/10/2021 1453   UROBILINOGEN 0.2 01/07/2014 1044   NITRITE NEGATIVE 12/27/2022 1539   LEUKOCYTESUR SMALL (A) 12/27/2022 1539    Lab Results  Component Value Date   LABMICR See below: 12/24/2022   WBCUA >30 (A) 12/24/2022   LABEPIT 0-10 12/24/2022   BACTERIA RARE (A) 12/27/2022    Pertinent Imaging:  No results found for this or any previous visit.  No results found for this or any previous visit.  No results found for this or any previous visit.  No results found for this or any previous visit.  No results found for this or any previous visit.  No valid procedures specified. No results found for this or any previous visit.  No results found for this or any previous visit.   Assessment & Plan:    1. Benign prostatic hyperplasia with urinary obstruction -continue rapaflo 8mg  at bedtime. Followup 1 year with PVR - Urinalysis, Routine w reflex microscopic  2. Urinary retention -continue rapalfo 8mg  daily.  - BLADDER SCAN AMB NON-IMAGING   No follow-ups on  file.  Wilkie Aye, MD  [facility] [place]

## 2023-08-06 NOTE — Progress Notes (Signed)
post void residual=208

## 2023-08-09 ENCOUNTER — Encounter: Payer: Self-pay | Admitting: Urology

## 2023-08-09 NOTE — Patient Instructions (Signed)

## 2023-08-12 ENCOUNTER — Encounter: Payer: Self-pay | Admitting: Family Medicine

## 2023-08-12 ENCOUNTER — Telehealth: Payer: Self-pay | Admitting: Family Medicine

## 2023-08-12 NOTE — Telephone Encounter (Signed)
Pt already has appt scheduled on 08/13/23 at 8:30 with Dr Alphonsus Sias. Sending note to Dr Alphonsus Sias who is out of office and FYI to Dr Para March as PCP. Who is in office.

## 2023-08-12 NOTE — Telephone Encounter (Signed)
error 

## 2023-08-12 NOTE — Telephone Encounter (Signed)
FYI: This call has been transferred to Access Nurse. Once the result note has been entered staff can address the message at that time.  Patient called in with the following symptoms:  Red Word:elevated blood pressure Patient daughter called in and stated that his blood pressure has been elevated since last Wednesday. She stated that it was reading 187/80 and today it was 180/87 without any symptoms. She stated that he was taking 2 lisinopril (ZESTRIL) 10 MG tablet but recently started following the directions and taking 1 daily. She isn't sure if that is causing the issue or not.   Please advise at Mobile [phone] (mobile)  Message is routed to Provider Pool and [facility] Triage

## 2023-08-13 ENCOUNTER — Ambulatory Visit (INDEPENDENT_AMBULATORY_CARE_PROVIDER_SITE_OTHER): Payer: Medicare Other | Admitting: Internal Medicine

## 2023-08-13 ENCOUNTER — Encounter: Payer: Self-pay | Admitting: Internal Medicine

## 2023-08-13 VITALS — BP 160/90 | HR 71 | Temp 97.8°F | Ht 71.0 in | Wt 180.0 lb

## 2023-08-13 DIAGNOSIS — I10 Essential (primary) hypertension: Secondary | ICD-10-CM

## 2023-08-13 DIAGNOSIS — N1832 Chronic kidney disease, stage 3b: Secondary | ICD-10-CM | POA: Diagnosis not present

## 2023-08-13 MED ORDER — LISINOPRIL 10 MG PO TABS: 20.0000 mg | ORAL_TABLET | Freq: Every day | ORAL | 0 refills | Status: DC

## 2023-08-13 NOTE — Progress Notes (Signed)
Subjective:    Patient ID: [name] [name], male    DOB: 10/05/1928, 89 y.o.   MRN: [id number]  HPI Here with daughter due to elevated blood pressure  Has had elevated BP over the past week Fluctuates 170-190 systolic---as low as 140 Average 170 over the past week Diastolic up to 90 (no higher)  Was high at urologist--- [phone] (and only went down to 187/90 after multiple checks)--last week  Does check regularly at home Mostly around 140 systolic  No chest pain  Slight DOE--no change.  Does do PT and exercising with them No headache--takes tylenol daily for back No palpitations  In reviewing meds---has gotten 20mg  of lisinopril until refill 2 weeks ago BP elevated since then  Current Outpatient Medications on File Prior to Visit  Medication Sig Dispense Refill   acetaminophen (TYLENOL) 500 MG tablet Take 2 tablets (1,000 mg total) by mouth 3 (three) times daily as needed for mild pain or moderate pain.     allopurinol (ZYLOPRIM) 300 MG tablet Take 1 tablet (300 mg total) by mouth daily. 90 tablet 3   aspirin 81 MG tablet Take 81 mg by mouth daily.     atorvastatin (LIPITOR) 40 MG tablet TAKE 1 TABLET DAILY 90 tablet 3   carvedilol (COREG CR) 20 MG 24 hr capsule Take 1 capsule (20 mg total) by mouth daily. 90 capsule 3   finasteride (PROSCAR) 5 MG tablet Take 1 tablet (5 mg total) by mouth daily. 90 tablet 3   folic acid (FOLVITE) 1 MG tablet Take 1 tablet (1 mg total) by mouth daily.     lisinopril (ZESTRIL) 10 MG tablet Take 1 tablet (10 mg total) by mouth daily. 90 tablet 3   melatonin 3 MG TABS tablet Take 1 tablet (3 mg total) by mouth at bedtime. 30 tablet 0   mirtazapine (REMERON) 7.5 MG tablet Take 1 tablet (7.5 mg total) by mouth at bedtime. Stop trazodone with mirtazapine use. 90 tablet 1   omeprazole (PRILOSEC) 40 MG capsule TAKE 1 CAPSULE DAILY AS NEEDED 90 capsule 3   primidone (MYSOLINE) 50 MG tablet TAKE ONE-HALF (1/2) TABLET DAILY 45 tablet 3   senna-docusate  (SENOKOT-S) 8.6-50 MG tablet Take 1-2 tablets by mouth daily.     triamcinolone ointment (KENALOG) 0.1 % Apply 1 Application topically 2 (two) times daily. Apply small amount twice daily to clean skin.  Do not use for more than 14 days in a row. 30 g 0   No current facility-administered medications on file prior to visit.    Allergies  Allergen Reactions   Amlodipine     Itching    Past Medical History:  Diagnosis Date   Acute renal failure (HCC) 12/06/2013   Back pain    occasionally   Basal cell carcinoma (BCC) of antihelix of left ear    Benign essential tremor 2005   takes Primidone daily   Choledocholithiasis 12/06/2013   Constipation    takes OTC stool softener   Coronary artery disease 2007   s/p CABG, DR Eden Emms   GERD (gastroesophageal reflux disease)    takes Nexium daily   Gout 1995   "~ once/yr" (03/17/2014)   History of colon polyps    Hyperlipidemia 12/2005   takes Atorvastatin daily   Hypertension 1995   takes Lisinopril and Coreg daily   possible HCAP (healthcare-associated pneumonia) 12/09/2013   Prostate cancer (HCC) 2007   S/P treatment, followed by Urology prev   Right bundle branch block (RBBB)  stage 3b chronic kidney disease (HCC)    Stenosis of cervical spine    Type II diabetes mellitus (HCC)    no meds;diet and exercise controlled    Wears dentures    Wears glasses     Past Surgical History:  Procedure Laterality Date   Adenosine myoview  01/24/2006   Ischemia by EKG, Cath   CATARACT EXTRACTION W/ INTRAOCULAR LENS IMPLANT Left 10/2013   CHOLECYSTECTOMY N/A 12/06/2013   Procedure: Diagnostic Laparoscopy for  drainage Intrabdominal abcess;  Surgeon: Emelia Loron, MD;  Location: [facility] OR;  Service: General;  Laterality: N/A;   CHOLECYSTECTOMY N/A 03/17/2014   Procedure: LAPAROSCOPIC CHOLECYSTECTOMY ;  Surgeon: Emelia Loron, MD;  Location: [facility] OR;  Service: General;  Laterality: N/A;   COLONOSCOPY     CORONARY ARTERY BYPASS GRAFT  2007    CABGX 4   CYSTOSCOPY  02/15/2004   Mod BPH, moder tribec ?   ERCP N/A 12/06/2013   Procedure: ENDOSCOPIC RETROGRADE CHOLANGIOPANCREATOGRAPHY (ERCP);  Surgeon: Rachael Fee, MD;  Location: [facility] OR;  Service: Endoscopy;  Laterality: N/A;   LAPAROSCOPIC CHOLECYSTECTOMY  03/17/2014   LUMBAR FUSION  02/2012   lumbar spine for spinal stenosis   POSTERIOR CERVICAL FUSION/FORAMINOTOMY N/A 05/01/2020   Procedure: Posterior cervical fusion with lateral mass fixation - Cervical three - Cervical six, laminectomy Cervical three-six;  Surgeon: Tia Alert, MD;  Location: [facility] OR;  Service: Neurosurgery;  Laterality: N/A;   PROSTATE CRYOABLATION  09/18/2005   prostate CA    Family History  Problem Relation Age of Onset   Heart disease Mother    Cancer Mother    Cancer Sister    Diabetes Sister    Cancer Brother        prostate   Prostate cancer Brother    Hypothyroidism Brother    Depression Neg Hx    Alcohol abuse Neg Hx    Drug abuse Neg Hx    Stroke Neg Hx    Colon cancer Neg Hx     Social History   Socioeconomic History   Marital status: Married    Spouse name: Not on file   Number of children: 1   Years of education: Not on file   Highest education level: Not on file  Occupational History   Occupation: Retired Development worker, [employer]  Tobacco Use   Smoking status: Never   Smokeless tobacco: Never  Vaping Use   Vaping status: Never Used  Substance and Sexual Activity   Alcohol use: No   Drug use: No   Sexual activity: Never  Other Topics Concern   Not on file  Social History Narrative   Widowed 2020 after 60+ years of marriage, lives with daughter.     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 [employer]   Agent orange exposure.  Sig service related noise exposure.     Right handed   Social Determinants of Health   Financial Resource Strain: Not on file  Food Insecurity: No Food Insecurity (01/14/2023)   Hunger Vital Sign    Worried About Running Out of Food in the Last Year: Never true     Ran Out of Food in the Last Year: Never true  Transportation Needs: No Transportation Needs (01/14/2023)   PRAPARE - Administrator, Civil Service (Medical): No    Lack of Transportation (Non-Medical): No  Physical Activity: Not on file  Stress: Not on file  Social Connections: Not on file  Intimate Partner Violence: Not  At Risk (12/27/2022)   Humiliation, Afraid, Rape, and Kick questionnaire    Fear of Current or Ex-Partner: No    Emotionally Abused: No    Physically Abused: No    Sexually Abused: No   Review of Systems Sleeps okay  Doesn't use salt    Objective:   Physical Exam Constitutional:      Appearance: Normal appearance.  Cardiovascular:     Rate and Rhythm: Normal rate and regular rhythm.     Heart sounds: No murmur heard.    No gallop.  Pulmonary:     Effort: Pulmonary effort is normal.     Breath sounds: Normal breath sounds. No wheezing or rales.  Musculoskeletal:     Cervical back: Neck supple.     Right lower leg: No edema.     Left lower leg: No edema.  Lymphadenopathy:     Cervical: No cervical adenopathy.  Neurological:     Mental Status: He is alert.            Assessment & Plan:

## 2023-08-13 NOTE — Telephone Encounter (Signed)
I will assess at the visit

## 2023-08-13 NOTE — Assessment & Plan Note (Signed)
Worsened considerably in hospital--but might have been from UTI, etc Will have to recheck renal function soon on the higher lisinopril

## 2023-08-13 NOTE — Assessment & Plan Note (Signed)
BP Readings from Last 3 Encounters:  08/13/23 (!) 160/90  08/06/23 (!) 187/80  06/10/23 (!) 158/70   Running high over the past 2 weeks since lisinopril reduced to 10mg   Had been taking 20mg  for 3 months with no symptoms--and better control Will go back to lisinopril 20mg  Continue the carvediolol Could consider hydralazine if doesn't do well back on the higher lisinopril

## 2023-08-13 NOTE — Telephone Encounter (Signed)
Agree. Thanks

## 2023-08-13 NOTE — Patient Instructions (Signed)
Please increase the lisinopril to 20mg  daily (two of the 10mg  dose) Monitor your blood pressure at home

## 2023-08-14 ENCOUNTER — Ambulatory Visit: Payer: No Typology Code available for payment source | Admitting: Family Medicine

## 2023-08-14 DIAGNOSIS — I129 Hypertensive chronic kidney disease with stage 1 through stage 4 chronic kidney disease, or unspecified chronic kidney disease: Secondary | ICD-10-CM | POA: Diagnosis not present

## 2023-08-14 DIAGNOSIS — E1122 Type 2 diabetes mellitus with diabetic chronic kidney disease: Secondary | ICD-10-CM | POA: Diagnosis not present

## 2023-08-14 DIAGNOSIS — I2510 Atherosclerotic heart disease of native coronary artery without angina pectoris: Secondary | ICD-10-CM | POA: Diagnosis not present

## 2023-08-14 DIAGNOSIS — M1990 Unspecified osteoarthritis, unspecified site: Secondary | ICD-10-CM | POA: Diagnosis not present

## 2023-08-14 DIAGNOSIS — N183 Chronic kidney disease, stage 3 unspecified: Secondary | ICD-10-CM | POA: Diagnosis not present

## 2023-08-14 DIAGNOSIS — D631 Anemia in chronic kidney disease: Secondary | ICD-10-CM | POA: Diagnosis not present

## 2023-08-28 ENCOUNTER — Ambulatory Visit: Payer: No Typology Code available for payment source | Admitting: Family Medicine

## 2023-09-08 ENCOUNTER — Encounter: Payer: Self-pay | Admitting: Family Medicine

## 2023-09-08 ENCOUNTER — Ambulatory Visit (INDEPENDENT_AMBULATORY_CARE_PROVIDER_SITE_OTHER): Payer: No Typology Code available for payment source | Admitting: Family Medicine

## 2023-09-08 VITALS — BP 142/70 | HR 72 | Temp 97.8°F | Ht 71.0 in | Wt 181.1 lb

## 2023-09-08 DIAGNOSIS — R051 Acute cough: Secondary | ICD-10-CM

## 2023-09-08 DIAGNOSIS — I10 Essential (primary) hypertension: Secondary | ICD-10-CM | POA: Diagnosis not present

## 2023-09-08 DIAGNOSIS — R251 Tremor, unspecified: Secondary | ICD-10-CM

## 2023-09-08 LAB — BASIC METABOLIC PANEL
BUN: 27 mg/dL — ABNORMAL HIGH (ref 6–23)
CO2: 27 meq/L (ref 19–32)
Calcium: 9.6 mg/dL (ref 8.4–10.5)
Chloride: 101 meq/L (ref 96–112)
Creatinine, Ser: 1.73 mg/dL — ABNORMAL HIGH (ref 0.40–1.50)
GFR: 33.82 mL/min — ABNORMAL LOW (ref >60.00)
Glucose, Bld: 156 mg/dL — ABNORMAL HIGH (ref 70–99)
Potassium: 4.6 meq/L (ref 3.5–5.1)
Sodium: 136 meq/L (ref 135–145)

## 2023-09-08 LAB — POC COVID19 BINAXNOW: SARS Coronavirus 2 Ag: NEGATIVE

## 2023-09-08 MED ORDER — LISINOPRIL 20 MG PO TABS: 20.0000 mg | ORAL_TABLET | Freq: Every day | ORAL | 3 refills | Status: DC

## 2023-09-08 MED ORDER — DOXYCYCLINE HYCLATE 100 MG PO TABS: 100.0000 mg | ORAL_TABLET | Freq: Two times a day (BID) | ORAL | 0 refills | Status: DC

## 2023-09-08 NOTE — Patient Instructions (Addendum)
Go to the lab on the way out.   If you have mychart we'll likely use that to update you.    Take care.  Glad to see you. Covid neg.  If you are feeling worse in the meantime, then let us know and start doxy.

## 2023-09-08 NOTE — Progress Notes (Addendum)
 BP is improved from prior. On higher dose of lisinopril now.  Not lightheaded. Discussed not increasing his med with DBP 64. He is taking 20mg  coreg and lisinopril.      Variable tremor, some better on primidone.  Ie primidone helped.    Recent URI sx.  Mult sick exposures- with others who tested neg for covid.  Sx started about 2 days ago.  Initially with ST.  Some congestion and cough.  No sputum.  No fevers.  Fatigued.  Covid neg at the OV.   He has administrative visit scheduled for the [place] tomorrow.    Meds, vitals, and allergies reviewed.   ROS: Per HPI unless specifically indicated in ROS section   Nad Ncat Neck supple, no LA Rrr Ctab Abd soft, not ttp Trace BLE edema. Skin well perfused.   31 minutes were devoted to patient care in this encounter (this includes time spent reviewing the patient's file/history, interviewing and examining the patient, counseling/reviewing plan with patient).

## 2023-09-09 DIAGNOSIS — R059 Cough, unspecified: Secondary | ICD-10-CM | POA: Insufficient documentation

## 2023-09-09 NOTE — Assessment & Plan Note (Signed)
 Suspect benign non-covid illness, likely viral. Supportive care for now.  However, if feeling worse in the meantime, then let us know and start doxy. He agrees with plan.

## 2023-09-09 NOTE — Assessment & Plan Note (Signed)
 Improved with 20mg  lisinopril.  Continue 20mg  coreg and 20mg  lisinopril.    See notes on BMET.

## 2023-09-17 DIAGNOSIS — C44329 Squamous cell carcinoma of skin of other parts of face: Secondary | ICD-10-CM | POA: Diagnosis not present

## 2023-09-17 DIAGNOSIS — D485 Neoplasm of uncertain behavior of skin: Secondary | ICD-10-CM | POA: Diagnosis not present

## 2023-09-17 DIAGNOSIS — L570 Actinic keratosis: Secondary | ICD-10-CM | POA: Diagnosis not present

## 2023-09-30 DIAGNOSIS — C44329 Squamous cell carcinoma of skin of other parts of face: Secondary | ICD-10-CM | POA: Diagnosis not present

## 2023-10-16 ENCOUNTER — Ambulatory Visit: Payer: Self-pay | Admitting: Family Medicine

## 2023-10-16 ENCOUNTER — Telehealth: Payer: Medicare Other | Admitting: Nurse Practitioner

## 2023-10-16 DIAGNOSIS — U071 COVID-19: Secondary | ICD-10-CM

## 2023-10-16 MED ORDER — MOLNUPIRAVIR EUA 200MG CAPSULE: 4.0000 | ORAL_CAPSULE | Freq: Two times a day (BID) | ORAL | 0 refills | Status: AC

## 2023-10-16 MED ORDER — BENZONATATE 100 MG PO CAPS: 100.0000 mg | ORAL_CAPSULE | Freq: Three times a day (TID) | ORAL | 0 refills | Status: DC | PRN

## 2023-10-16 MED ORDER — IPRATROPIUM BROMIDE 0.03 % NA SOLN: 2.0000 | Freq: Two times a day (BID) | NASAL | 12 refills | Status: DC

## 2023-10-16 NOTE — Telephone Encounter (Signed)
  Chief Complaint: sore throat Symptoms: fevers Frequency: yesterday Pertinent Negatives: Patient denies fevers, SOB, chest pain Disposition: [] ED /[] Urgent Care (no appt availability in office) / [] Appointment(In office/virtual)/ [x]  [facility]/ [] Home Care/ [] Refused Recommended Disposition /[] [facility]/ []  Follow-up with PCP Additional Notes: Patient daughter, Arland, calling on his behalf c/o sore throat, cough, congestion since yesterday. Daughter denies fevers. She endorses weakness, and patient not wanting to get out of bed, but is able to walk to BR. Endorses poor appetite, but able to take liquids. Scheduled patient per protocol on 10/16/2023 via virtual UC. Caregiver verbalized understanding and to call back with worsening symptoms.  Of note, patient could be heard on the phone with Arland during triage call.    Copied from CRM [id number]. Topic: Clinical - Red Word Triage >> Oct 16, 2023  9:19 AM Mercedes MATSU wrote: Red Word that prompted transfer to Nurse Triage: Patients daughter ARLAND is calling on his behalf. She states that the patient has come down with a really bad cold and he can barely walk. She is requesting a video visit for the patient. Reason for Disposition  [1] Sore throat is the only symptom AND [2] present > 48 hours  Answer Assessment - Initial Assessment Questions 1. ONSET: When did the nasal discharge start?      yesterday 2. AMOUNT: How much discharge is there?      Unknown, his face looks swollen 3. COUGH: Do you have a cough? If Yes, ask: Describe the color of your sputum (clear, white, yellow, green)     yes 4. RESPIRATORY DISTRESS: Describe your breathing.      Daughter denies distress 5. FEVER: Do you have a fever? If Yes, ask: What is your temperature, how was it measured, and when did it start?     No, 98 6. SEVERITY: Overall, how bad are you feeling right now? (e.g., doesn't interfere with normal activities, staying  home from school/work, staying in bed)      Can't get out of bed - hard for him to walk, but able to get up and use BR 7. OTHER SYMPTOMS: Do you have any other symptoms? (e.g., sore throat, earache, wheezing, vomiting)     Sore throat Arland, daughter  Answer Assessment - Initial Assessment Questions 1. ONSET: When did the throat start hurting? (Hours or days ago)      yesterday 2. SEVERITY: How bad is the sore throat? (Scale 1-10; mild, moderate or severe)   - MILD (1-3):  Doesn't interfere with eating or normal activities.   - MODERATE (4-7): Interferes with eating some solids and normal activities.   - SEVERE (8-10):  Excruciating pain, interferes with most normal activities.   - SEVERE WITH DYSPHAGIA (10): Can't swallow liquids, drooling.     moderate 3. STREP EXPOSURE: Has there been any exposure to strep within the past week? If Yes, ask: What type of contact occurred?      No, hasn't left house in days  6. PUS ON THE TONSILS: Is there pus on the tonsils in the back of your throat?     unknown  Protocols used: Common Cold-A-AH, Sore Throat-A-AH

## 2023-10-16 NOTE — Progress Notes (Signed)
 Virtual Visit Consent   KELLEY KNOTH, you are scheduled for a virtual visit with a [facility] provider today. Just as with appointments in the office, your consent must be obtained to participate. Your consent will be active for this visit and any virtual visit you may have with one of our providers in the next 365 days. If you have a MyChart account, a copy of this consent can be sent to you electronically.  As this is a virtual visit, video technology does not allow for your provider to perform a traditional examination. This may limit your provider's ability to fully assess your condition. If your provider identifies any concerns that need to be evaluated in person or the need to arrange testing (such as labs, EKG, etc.), we will make arrangements to do so. Although advances in technology are sophisticated, we cannot ensure that it will always work on either your end or our end. If the connection with a video visit is poor, the visit may have to be switched to a telephone visit. With either a video or telephone visit, we are not always able to ensure that we have a secure connection.  By engaging in this virtual visit, you consent to the provision of healthcare and authorize for your insurance to be billed (if applicable) for the services provided during this visit. Depending on your insurance coverage, you may receive a charge related to this service.  I need to obtain your verbal consent now. Are you willing to proceed with your visit today? JAHKARI MACLIN has provided verbal consent on 10/16/2023 for a virtual visit (video or telephone). Lauraine Kitty, FNP  Date: 10/16/2023 5:02 PM  Virtual Visit via Video Note   I, Lauraine Kitty, connected with  [name] [name]  ([id number], 03/25/1929) on 10/16/23 at  5:00 PM EST by a video-enabled telemedicine application and verified that I am speaking with the correct person using two identifiers.  Location: Patient: Virtual Visit Location  Patient: Home Provider: Virtual Visit Location Provider: Home Office  Daughter: present with patient at home to help provide history   I discussed the limitations of evaluation and management by telemedicine and the availability of in person appointments. The patient expressed understanding and agreed to proceed.    History of Present Illness: JAZIAH GOELLER is a 89 y.o. who identifies as a male who was assigned male at birth, and is being seen today with concerns over recent development of sore throat, weakness, body aches  He took a home COVID test and it was positive (today)  Symptom onset was yesterday  He has had COVID in the past without complication   He has been vaccinated for COVID   Symptoms today include sinus congestion and a sore throat mainly   Denies SOB or wheezing    History significant for CKD 3 last GFR was 33 Problems:  Patient Active Problem List   Diagnosis Date Noted   Cough 09/09/2023   Other fatigue 06/11/2023   Dysuria 01/08/2023   BPH (benign prostatic hyperplasia) 12/28/2022   Failure to thrive in adult 12/28/2022   Skin lesion 08/21/2022   History of prostate cancer 10/10/2021   Acute on chronic urinary retention 10/10/2021   Cystitis 10/10/2021   DNR (do not resuscitate) 06/25/2021   Constipation 07/10/2020   Sleep disturbance 07/10/2020   Abnormality of gait 07/10/2020   Insomnia 06/12/2020   Acute renal failure superimposed on stage 3b chronic kidney disease (HCC)    Hyponatremia  Hypoalbuminemia due to protein-calorie malnutrition (HCC)    Transaminitis    Acute on chronic anemia    Stenosis of cervical spine with myelopathy (HCC) 05/04/2020   S/P cervical spinal fusion 05/01/2020   History of agent Orange exposure 04/05/2020   Paresthesia 01/20/2020   Health care maintenance 12/28/2017   GERD (gastroesophageal reflux disease) 12/28/2017   Medicare annual wellness visit, subsequent 10/30/2014   Advance care planning 10/30/2014    UTI (urinary tract infection) 04/19/2014   Right bundle branch block (RBBB)    Wheezing 12/09/2013   CKD (chronic kidney disease), stage III (HCC) 12/06/2013   Back pain 10/20/2011   Hypothyroidism 03/23/2009   Vitamin D deficiency 03/23/2009   Tremor 12/18/2006   CAD (coronary artery disease) 12/18/2006   Mixed hyperlipidemia 12/08/2005   History of diabetes mellitus 02/07/2005   Gout 09/09/1993   Essential hypertension 09/09/1993    Allergies:  Allergies  Allergen Reactions   Amlodipine      Itching   Medications:  Current Outpatient Medications:    acetaminophen  (TYLENOL ) 500 MG tablet, Take 2 tablets (1,000 mg total) by mouth 3 (three) times daily as needed for mild pain or moderate pain., Disp: , Rfl:    allopurinol  (ZYLOPRIM ) 300 MG tablet, Take 1 tablet (300 mg total) by mouth daily., Disp: 90 tablet, Rfl: 3   aspirin  81 MG tablet, Take 81 mg by mouth daily., Disp: , Rfl:    atorvastatin  (LIPITOR) 40 MG tablet, TAKE 1 TABLET DAILY, Disp: 90 tablet, Rfl: 3   carvedilol  (COREG  CR) 20 MG 24 hr capsule, Take 1 capsule (20 mg total) by mouth daily., Disp: 90 capsule, Rfl: 3   doxycycline  (VIBRA -TABS) 100 MG tablet, Take 1 tablet (100 mg total) by mouth 2 (two) times daily., Disp: 14 tablet, Rfl: 0   finasteride  (PROSCAR ) 5 MG tablet, Take 1 tablet (5 mg total) by mouth daily., Disp: 90 tablet, Rfl: 3   folic acid  (FOLVITE ) 1 MG tablet, Take 1 tablet (1 mg total) by mouth daily., Disp: , Rfl:    lisinopril  (ZESTRIL ) 20 MG tablet, Take 1 tablet (20 mg total) by mouth daily., Disp: 90 tablet, Rfl: 3   melatonin 3 MG TABS tablet, Take 1 tablet (3 mg total) by mouth at bedtime., Disp: 30 tablet, Rfl: 0   mirtazapine  (REMERON ) 7.5 MG tablet, Take 1 tablet (7.5 mg total) by mouth at bedtime. Stop trazodone  with mirtazapine  use., Disp: 90 tablet, Rfl: 1   omeprazole  (PRILOSEC) 40 MG capsule, TAKE 1 CAPSULE DAILY AS NEEDED, Disp: 90 capsule, Rfl: 3   primidone  (MYSOLINE ) 50 MG tablet,  TAKE ONE-HALF (1/2) TABLET DAILY, Disp: 45 tablet, Rfl: 3   senna-docusate (SENOKOT-S) 8.6-50 MG tablet, Take 1-2 tablets by mouth daily., Disp: , Rfl:    triamcinolone  ointment (KENALOG ) 0.1 %, Apply 1 Application topically 2 (two) times daily. Apply small amount twice daily to clean skin.  Do not use for more than 14 days in a row., Disp: 30 g, Rfl: 0  Observations/Objective: Patient is well-developed, well-nourished in no acute distress.  Resting comfortably  at home.  Head is normocephalic, atraumatic.  No labored breathing.  Speech is clear and coherent with logical content.  Patient is alert and oriented at baseline.    Assessment and Plan:  1. COVID-19 (Primary)  Discussed hydration, using boost shakes to supplement calories  May use Coricidin HBP for symptom relief as well   - molnupiravir  EUA (LAGEVRIO ) 200 mg CAPS capsule; Take 4 capsules (800 mg total)  by mouth 2 (two) times daily for 5 days.  Dispense: 40 capsule; Refill: 0 - ipratropium (ATROVENT ) 0.03 % nasal spray; Place 2 sprays into both nostrils every 12 (twelve) hours.  Dispense: 30 mL; Refill: 12 - benzonatate  (TESSALON ) 100 MG capsule; Take 1 capsule (100 mg total) by mouth 3 (three) times daily as needed.  Dispense: 30 capsule; Refill: 0     Follow Up Instructions: I discussed the assessment and treatment plan with the patient. The patient was provided an opportunity to ask questions and all were answered. The patient agreed with the plan and demonstrated an understanding of the instructions.  A copy of instructions were sent to the patient via MyChart unless otherwise noted below.    The patient was advised to call back or seek an in-person evaluation if the symptoms worsen or if the condition fails to improve as anticipated.    Lauraine Kitty, FNP

## 2023-10-16 NOTE — Telephone Encounter (Signed)
 Noted. Thanks.

## 2023-10-24 ENCOUNTER — Ambulatory Visit: Payer: Self-pay | Admitting: Family Medicine

## 2023-10-24 ENCOUNTER — Telehealth: Payer: Self-pay | Admitting: Nurse Practitioner

## 2023-10-24 ENCOUNTER — Ambulatory Visit
Admission: EM | Admit: 2023-10-24 | Discharge: 2023-10-24 | Disposition: A | Discharge status: Home / Self Care | Payer: No Typology Code available for payment source | Attending: Nurse Practitioner | Admitting: Nurse Practitioner

## 2023-10-24 ENCOUNTER — Ambulatory Visit (HOSPITAL_COMMUNITY)
Admit: 2023-10-24 | Discharge: 2023-10-24 | Disposition: A | Discharge status: Home / Self Care | Payer: No Typology Code available for payment source | Attending: Cardiovascular Disease | Admitting: Cardiovascular Disease

## 2023-10-24 DIAGNOSIS — R531 Weakness: Secondary | ICD-10-CM | POA: Diagnosis present

## 2023-10-24 DIAGNOSIS — R0989 Other specified symptoms and signs involving the circulatory and respiratory systems: Secondary | ICD-10-CM | POA: Diagnosis not present

## 2023-10-24 DIAGNOSIS — Z8616 Personal history of COVID-19: Secondary | ICD-10-CM | POA: Insufficient documentation

## 2023-10-24 DIAGNOSIS — R059 Cough, unspecified: Secondary | ICD-10-CM | POA: Insufficient documentation

## 2023-10-24 LAB — POCT INFLUENZA A/B
Influenza A, POC: NEGATIVE
Influenza B, POC: NEGATIVE

## 2023-10-24 LAB — POC RSV: RSV Antigen, POC: NEGATIVE

## 2023-10-24 MED ORDER — FLUTICASONE PROPIONATE 50 MCG/ACT NA SUSP
2.0000 | Freq: Every day | NASAL | 0 refills | Status: DC
Start: 2023-10-24 — End: 2024-01-28

## 2023-10-24 MED ORDER — AMOXICILLIN-POT CLAVULANATE 875-125 MG PO TABS: 1.0000 | ORAL_TABLET | Freq: Two times a day (BID) | ORAL | 0 refills | Status: DC

## 2023-10-24 NOTE — Telephone Encounter (Signed)
Chest x-ray was negative for active cardiopulmonary disease.  Will treat patient for acute sinusitis with Augmentin 875/125 mg tablets, and fluticasone 50 mcg nasal spray.  The patient's daughter was contacted by the nursing staff and advised of same.

## 2023-10-24 NOTE — Telephone Encounter (Signed)
Noted, will await UC report.  Thanks.

## 2023-10-24 NOTE — Discharge Instructions (Addendum)
Go to [facility] to the Radiology Department for a chest xray.  Influenza and RSV test are also pending.  You will be contacted once the results are received.  You will have access to the results via MyChart. Once the xray results are received, medication will be sent to his pharmacy based on the results. May take OTC Tylenol as needed for pain, fever, or general discomfort. Recommend normal saline nasal spray for nasal congestion and runny nose. Recommend using a humidifier at nighttime during sleep and sleeping elevated while the cough persists. Go to the emergency department for increased weakness, shortness of breath, difficulty breathing or other concerns. Follow-up with PCP within 7 to 10 days for reevaluation. Follow-up as needed.

## 2023-10-24 NOTE — ED Triage Notes (Signed)
Pt reports he is SOB, has chest congestion, weak  and a cough x  1 day

## 2023-10-24 NOTE — Telephone Encounter (Signed)
Daughter called in stating EMS is at home and EMS is stating patient is stable, so patient is refusing transport. This RN spoke with paramedic who stated vital signs are perfect, patient is not wanting to go to ED via EMS, and the patient's lungs sound clear of wheezing or rhonci. Patient is not febrile. Paramedics suggested helping patient into personal vehicle with daughter and they can assist with ambulation so patient can be seen at Urgent Care. Patient agreed to this plan and this will happen now. Advised daughter to call back after Urgent Care visit to make follow up appointment, if needed.   Copied from CRM [id number]. Topic: Clinical - Medical Advice >> Oct 24, 2023 12:49 PM [name] [name] wrote: Reason for CRM: [name] [name] - daughter calling, states she spoke to nurse triage earlier who advised her to call 911 for pt. Ambulance states he is presenting stable, very stable & oxygen levels are fine. Requesting to speak with [name] nurse.

## 2023-10-24 NOTE — Telephone Encounter (Signed)
Copied from CRM [id number]. Topic: Clinical - Red Word Triage >> Oct 24, 2023 11:19 AM Mackie Pai E wrote: [facility] that prompted transfer to Nurse Triage: Patient's daughter on the phone stating that her father (patient) was diagnosed with COVID last week, was prescribed medication but he is not improving. Patient is still experiencing muscle weakness, trouble walking, sinus drainage, and a consistent cough.  Chief Complaint: Worsening COVID symptoms Symptoms: SOB, weakness, congestion, cough Frequency: Since last Wednesday Pertinent Negatives: Patient denies fever Disposition: [x] ED /[] Urgent Care (no appt availability in office) / [] Appointment(In office/virtual)/ []  [facility]/ [] Home Care/ [] Refused Recommended Disposition /[]  Mobile Bus/ []  Follow-up with PCP Additional Notes: Spoke to the patient's daughter, [name], on behalf of her father who is experiencing worsening COVID symptoms. Patient started experiencing COVID symptoms on Wednesday of last week. Patient was diagnosed with COVID via a virtual appointment the next day. Patient is currently experiencing mild/moderate SOB, weakness, congestion and coughing spells. Patient stated he feels SOB at rest and feels that he cannot take deep breaths. Patient's daughter stated that the patient is too weak to get out of the bed today. This RN advised patient to go to the ED. Daughter is unable to transport patient. Daughter agreed to call 911 at this time.   Reason for Disposition  [1] MODERATE difficulty breathing (e.g., speaks in phrases, SOB even at rest, pulse 100-120) AND [2] new-onset or WORSE  Answer Assessment - Initial Assessment Questions 1. COVID-19 ONSET: "When did the symptoms of COVID-19 first start?"     Symptoms started last Wednesday 2. DIAGNOSIS CONFIRMATION: "How were you diagnosed?" (e.g., COVID-19 oral or nasal viral test; COVID-19 antibody test; doctor visit)     10/16/23 virtual appointment and diagnosed  with COVID 3. MAIN SYMPTOM:  "What is your main concern or symptom right now?" (e.g., breathing difficulty, cough, fatigue. loss of smell)     Difficulty breathing 4. SYMPTOM ONSET: "When did the symptoms start?"     Last Wednesday 5. BETTER-SAME-WORSE: "Are you getting better, staying the same, or getting worse over the last 1 to 2 weeks?"     Daughter thinks symptoms are getting worse, patient is unable to get out of bed today 6. RECENT MEDICAL VISIT: "Have you been seen by a healthcare provider (doctor, NP, PA) for these persisting COVID-19 symptoms?" If Yes, ask: "When were you seen?" (e.g., date)     Virtual appointment on 10/16/23 7. COUGH: "Do you have a cough?" If Yes, ask: "How bad is the cough?"       Severe coughing spells 8. FEVER: "Do you have a fever?" If Yes, ask: "What is your temperature, how was it measured, and when did it start?"     Denies 9. BREATHING DIFFICULTY: "Are you having any trouble breathing?" If Yes, ask: "How bad is your breathing?" (e.g., mild, moderate, severe)    - MILD: No SOB at rest, mild SOB with walking, speaks normally in sentences, can lie down, no retractions, pulse < 100.    - MODERATE: SOB at rest, SOB with minimal exertion and prefers to sit, cannot lie down flat, speaks in phrases, mild retractions, audible wheezing, pulse 100-120.    - SEVERE: Very SOB at rest, speaks in single words, struggling to breathe, sitting hunched forward, retractions, pulse > 120.       Mild/moderate- states it's hard to take deep breaths, states he is having SOB at rest, daughter states patient is still able to speak in clear  and complete sentences 10. OTHER SYMPTOMS: "Do you have any other symptoms?"  (e.g., fatigue, headache, muscle pain, weakness)     Weakness, sinus congestion, coughing, "feels miserable", SOB 11. HIGH RISK DISEASE: "Do you have any chronic medical problems?" (e.g., asthma, heart or lung disease, weak immune system, obesity, etc.)      Kidney  disease  Protocols used: Coronavirus (COVID-19) Persisting Symptoms Follow-up Call-[facility]

## 2023-10-24 NOTE — ED Provider Notes (Addendum)
[facility]    CSN: [id number] Arrival date & time: 10/24/23  1315      History   Chief Complaint Chief Complaint  Patient presents with   Shortness of Breath    HPI [name] [name] is a 89 y.o. male.   The history is provided by the patient and a relative (Daughter).   Patient brought in by his daughter for complaints of shortness of breath, chest congestion, cough, and generalized weakness.  Daughter reports patient was diagnosed with COVID on 2/6.  Patient completed a telehealth visit and was prescribed molnupiravir, ipratropium nasal spray, and benzonatate.  Daughter reports over the past several days, patient has had increased weakness, nasal congestion, runny nose, and postnasal drainage.  Patient and daughter deny fever, chills, ear pain, wheezing, difficulty breathing, chest pain, abdominal pain, nausea, vomiting, diarrhea, or rash.  Daughter states that when she completed the telehealth visit, she was told that if patient's symptoms do not improve, that he will need follow-up for possible antibiotics.  Past Medical History:  Diagnosis Date   Acute renal failure (HCC) 12/06/2013   Back pain    occasionally   Basal cell carcinoma (BCC) of antihelix of left ear    Benign essential tremor 2005   takes Primidone daily   Choledocholithiasis 12/06/2013   Constipation    takes OTC stool softener   Coronary artery disease 2007   s/p CABG, DR Eden Emms   GERD (gastroesophageal reflux disease)    takes Nexium daily   Gout 1995   "~ once/yr" (03/17/2014)   History of colon polyps    Hyperlipidemia 12/2005   takes Atorvastatin daily   Hypertension 1995   takes Lisinopril and Coreg daily   possible HCAP (healthcare-associated pneumonia) 12/09/2013   Prostate cancer (HCC) 2007   S/P treatment, followed by Urology prev   Right bundle branch block (RBBB)    stage 3b chronic kidney disease (HCC)    Stenosis of cervical spine    Type II diabetes mellitus (HCC)     no meds;diet and exercise controlled    Wears dentures    Wears glasses     Patient Active Problem List   Diagnosis Date Noted   Cough 09/09/2023   Other fatigue 06/11/2023   Dysuria 01/08/2023   BPH (benign prostatic hyperplasia) 12/28/2022   Failure to thrive in adult 12/28/2022   Skin lesion 08/21/2022   History of prostate cancer 10/10/2021   Acute on chronic urinary retention 10/10/2021   Cystitis 10/10/2021   DNR (do not resuscitate) 06/25/2021   Constipation 07/10/2020   Sleep disturbance 07/10/2020   Abnormality of gait 07/10/2020   Insomnia 06/12/2020   Acute renal failure superimposed on stage 3b chronic kidney disease (HCC)    Hyponatremia    Hypoalbuminemia due to protein-calorie malnutrition (HCC)    Transaminitis    Acute on chronic anemia    Stenosis of cervical spine with myelopathy (HCC) 05/04/2020   S/P cervical spinal fusion 05/01/2020   History of agent Orange exposure 04/05/2020   Paresthesia 01/20/2020   Health care maintenance 12/28/2017   GERD (gastroesophageal reflux disease) 12/28/2017   Medicare annual wellness visit, subsequent 10/30/2014   Advance care planning 10/30/2014   UTI (urinary tract infection) 04/19/2014   Right bundle branch block (RBBB)    Wheezing 12/09/2013   CKD (chronic kidney disease), stage III (HCC) 12/06/2013   Back pain 10/20/2011   Hypothyroidism 03/23/2009   Vitamin D deficiency 03/23/2009   Tremor 12/18/2006  CAD (coronary artery disease) 12/18/2006   Mixed hyperlipidemia 12/08/2005   History of diabetes mellitus 02/07/2005   Gout 09/09/1993   Essential hypertension 09/09/1993    Past Surgical History:  Procedure Laterality Date   Adenosine myoview  01/24/2006   Ischemia by EKG, Cath   CATARACT EXTRACTION W/ INTRAOCULAR LENS IMPLANT Left 10/2013   CHOLECYSTECTOMY N/A 12/06/2013   Procedure: Diagnostic Laparoscopy for  drainage Intrabdominal abcess;  Surgeon: Emelia Loron, MD;  Location: [facility] OR;  Service:  General;  Laterality: N/A;   CHOLECYSTECTOMY N/A 03/17/2014   Procedure: LAPAROSCOPIC CHOLECYSTECTOMY ;  Surgeon: Emelia Loron, MD;  Location: [facility] OR;  Service: General;  Laterality: N/A;   COLONOSCOPY     CORONARY ARTERY BYPASS GRAFT  2007   CABGX 4   CYSTOSCOPY  02/15/2004   Mod BPH, moder tribec ?   ERCP N/A 12/06/2013   Procedure: ENDOSCOPIC RETROGRADE CHOLANGIOPANCREATOGRAPHY (ERCP);  Surgeon: Rachael Fee, MD;  Location: [facility] OR;  Service: Endoscopy;  Laterality: N/A;   LAPAROSCOPIC CHOLECYSTECTOMY  03/17/2014   LUMBAR FUSION  02/2012   lumbar spine for spinal stenosis   POSTERIOR CERVICAL FUSION/FORAMINOTOMY N/A 05/01/2020   Procedure: Posterior cervical fusion with lateral mass fixation - Cervical three - Cervical six, laminectomy Cervical three-six;  Surgeon: Tia Alert, MD;  Location: [facility] OR;  Service: Neurosurgery;  Laterality: N/A;   PROSTATE CRYOABLATION  09/18/2005   prostate CA       Home Medications    Prior to Admission medications   Medication Sig Start Date End Date Taking? Authorizing Provider  acetaminophen (TYLENOL) 500 MG tablet Take 2 tablets (1,000 mg total) by mouth 3 (three) times daily as needed for mild pain or moderate pain. 06/06/20   Joaquim Nam, MD  allopurinol (ZYLOPRIM) 300 MG tablet Take 1 tablet (300 mg total) by mouth daily. 05/14/23   Joaquim Nam, MD  amoxicillin-clavulanate (AUGMENTIN) 875-125 MG tablet Take 1 tablet by mouth every 12 (twelve) hours. 10/24/23   Leath-Warren, Sadie Haber, NP  aspirin 81 MG tablet Take 81 mg by mouth daily.    [provider]  atorvastatin (LIPITOR) 40 MG tablet TAKE 1 TABLET DAILY 05/07/23   Joaquim Nam, MD  benzonatate (TESSALON) 100 MG capsule Take 1 capsule (100 mg total) by mouth 3 (three) times daily as needed. 10/16/23   Viviano Simas, FNP  carvedilol (COREG CR) 20 MG 24 hr capsule Take 1 capsule (20 mg total) by mouth daily. 05/14/23   Joaquim Nam, MD  doxycycline (VIBRA-TABS) 100 MG  tablet Take 1 tablet (100 mg total) by mouth 2 (two) times daily. 09/08/23   Joaquim Nam, MD  finasteride (PROSCAR) 5 MG tablet Take 1 tablet (5 mg total) by mouth daily. 06/10/23   Joaquim Nam, MD  fluticasone Aleda Grana) 50 MCG/ACT nasal spray Place 2 sprays into both nostrils daily. 10/24/23   Leath-Warren, Sadie Haber, NP  folic acid (FOLVITE) 1 MG tablet Take 1 tablet (1 mg total) by mouth daily. 12/31/22   Catarina Hartshorn, MD  ipratropium (ATROVENT) 0.03 % nasal spray Place 2 sprays into both nostrils every 12 (twelve) hours. 10/16/23   Viviano Simas, FNP  lisinopril (ZESTRIL) 20 MG tablet Take 1 tablet (20 mg total) by mouth daily. 09/08/23   Joaquim Nam, MD  melatonin 3 MG TABS tablet Take 1 tablet (3 mg total) by mouth at bedtime. 05/17/20   Love, Evlyn Kanner, PA-C  mirtazapine (REMERON) 7.5 MG tablet Take 1 tablet (7.5  mg total) by mouth at bedtime. Stop trazodone with mirtazapine use. 06/18/23   Joaquim Nam, MD  omeprazole (PRILOSEC) 40 MG capsule TAKE 1 CAPSULE DAILY AS NEEDED 04/04/23   Joaquim Nam, MD  primidone (MYSOLINE) 50 MG tablet TAKE ONE-HALF (1/2) TABLET DAILY 05/07/23   Joaquim Nam, MD  senna-docusate (SENOKOT-S) 8.6-50 MG tablet Take 1-2 tablets by mouth daily. 06/22/21   Joaquim Nam, MD  triamcinolone ointment (KENALOG) 0.1 % Apply 1 Application topically 2 (two) times daily. Apply small amount twice daily to clean skin.  Do not use for more than 14 days in a row. 02/04/23   Valentino Nose, NP    Family History Family History  Problem Relation Age of Onset   Heart disease Mother    Cancer Mother    Cancer Sister    Diabetes Sister    Cancer Brother        prostate   Prostate cancer Brother    Hypothyroidism Brother    Depression Neg Hx    Alcohol abuse Neg Hx    Drug abuse Neg Hx    Stroke Neg Hx    Colon cancer Neg Hx     Social History Social History   Tobacco Use   Smoking status: Never   Smokeless tobacco: Never  Vaping Use    Vaping status: Never Used  Substance Use Topics   Alcohol use: No   Drug use: No     Allergies   Amlodipine   Review of Systems Review of Systems Per HPI  Physical Exam Triage Vital Signs ED Triage Vitals  Encounter Vitals Group     BP 10/24/23 1325 (!) 193/81     Systolic BP Percentile --      Diastolic BP Percentile --      Pulse Rate 10/24/23 1325 86     Resp 10/24/23 1325 20     Temp 10/24/23 1325 (!) 97.5 F (36.4 C)     Temp Source 10/24/23 1325 Oral     SpO2 10/24/23 1325 93 %     Weight --      Height --      Head Circumference --      Peak Flow --      Pain Score 10/24/23 1328 0     Pain Loc --      Pain Education --      Exclude from Growth Chart --    No data found.  Updated Vital Signs BP (!) 193/81   Pulse 86   Temp (!) 97.5 F (36.4 C) (Oral)   Resp 20   SpO2 93%   Visual Acuity Right Eye Distance:   Left Eye Distance:   Bilateral Distance:    Right Eye Near:   Left Eye Near:    Bilateral Near:     Physical Exam Vitals and nursing note reviewed.  Constitutional:      General: He is not in acute distress.    Appearance: Normal appearance.  HENT:     Head: Normocephalic.     Right Ear: Tympanic membrane, ear canal and external ear normal.     Left Ear: Tympanic membrane, ear canal and external ear normal.     Nose: Congestion present.     Right Turbinates: Enlarged and swollen.     Left Turbinates: Enlarged and swollen.     Right Sinus: No maxillary sinus tenderness or frontal sinus tenderness.     Left Sinus: No maxillary sinus  tenderness or frontal sinus tenderness.     Mouth/Throat:     Lips: Pink.     Mouth: Mucous membranes are moist.     Pharynx: Uvula midline. Posterior oropharyngeal erythema and postnasal drip present. No pharyngeal swelling, oropharyngeal exudate or uvula swelling.  Eyes:     Extraocular Movements: Extraocular movements intact.     Conjunctiva/sclera: Conjunctivae normal.     Pupils: Pupils are equal,  round, and reactive to light.  Cardiovascular:     Rate and Rhythm: Normal rate and regular rhythm.     Pulses: Normal pulses.     Heart sounds: Normal heart sounds.  Pulmonary:     Effort: Pulmonary effort is normal. No respiratory distress.     Breath sounds: Normal breath sounds. No stridor. No wheezing, rhonchi or rales.  Abdominal:     General: Bowel sounds are normal.     Palpations: Abdomen is soft.     Tenderness: There is no abdominal tenderness.  Musculoskeletal:     Cervical back: Normal range of motion.  Lymphadenopathy:     Cervical: No cervical adenopathy.  Skin:    General: Skin is warm and dry.  Neurological:     General: No focal deficit present.     Mental Status: He is alert and oriented to person, place, and time.  Psychiatric:        Mood and Affect: Mood normal.        Behavior: Behavior normal.      UC Treatments / Results  Labs (all labs ordered are listed, but only abnormal results are displayed) Labs Reviewed  POCT INFLUENZA A/B - Normal  POC RSV - Normal  CBC WITH DIFFERENTIAL/PLATELET  COMPREHENSIVE METABOLIC PANEL    EKG   Radiology DG Chest 2 View Result Date: 10/24/2023 CLINICAL DATA:  Cough EXAM: CHEST - 2 VIEW COMPARISON:  08/13/2022 FINDINGS: Stable heart size status post sternotomy and CABG. Aortic atherosclerosis. Elevation of the right hemidiaphragm. No focal airspace consolidation, pleural effusion, or pneumothorax. IMPRESSION: No active cardiopulmonary disease. Electronically Signed   By: Duanne Guess D.O.   On: 10/24/2023 16:27    Procedures Procedures (including critical care time)  Medications Ordered in UC Medications - No data to display  Initial Impression / Assessment and Plan / UC Course  I have reviewed the triage vital signs and the nursing notes.  Pertinent labs & imaging results that were available during my care of the patient were reviewed by me and considered in my medical decision making (see chart for  details).  Chest x-ray is pending.  Patient and daughter will go to [facility] for imaging.  Daughter was advised once the results of the imaging are received, she will be contacted.  CBC and CMP collected for safety.  Symptoms are most likely residual of patient's recent COVID diagnosis.  Daughter was advised if the x-ray is negative, will treat patient for acute sinusitis.  Supportive care recommendations were provided and discussed with the patient's daughter to include fluids, rest, over-the-counter Tylenol, and use of a humidifier at nighttime during sleep.  Daughter was advised that if patient continues to complain of weakness, fatigue, or other concerns, it is recommended that he go to the emergency department for further evaluation.  Daughter was in agreement with this plan of care and verbalized understanding.  All questions were answered.  Patient stable for discharge.   Final Clinical Impressions(s) / UC Diagnoses   Final diagnoses:  Cough, unspecified type  Weakness  Upper respiratory symptom  History of COVID-19     Discharge Instructions      Go to [facility] to the Radiology Department for a chest xray.  Influenza and RSV test are also pending.  You will be contacted once the results are received.  You will have access to the results via MyChart. Once the xray results are received, medication will be sent to his pharmacy based on the results. May take OTC Tylenol as needed for pain, fever, or general discomfort. Recommend normal saline nasal spray for nasal congestion and runny nose. Recommend using a humidifier at nighttime during sleep and sleeping elevated while the cough persists. Go to the emergency department for increased weakness, shortness of breath, difficulty breathing or other concerns. Follow-up with PCP within 7 to 10 days for reevaluation. Follow-up as needed.      ED Prescriptions   None    PDMP not reviewed this encounter.    Abran Cantor, NP 10/24/23 1801    Abran Cantor, NP 10/24/23 2003

## 2023-10-24 NOTE — Telephone Encounter (Signed)
FYI

## 2023-10-25 LAB — COMPREHENSIVE METABOLIC PANEL
ALT: 14 [IU]/L (ref 0–44)
AST: 19 [IU]/L (ref 0–40)
Albumin: 3.8 g/dL (ref 3.6–4.6)
Alkaline Phosphatase: 96 [IU]/L (ref 44–121)
BUN/Creatinine Ratio: 25 — ABNORMAL HIGH (ref 10–24)
BUN: 43 mg/dL — ABNORMAL HIGH (ref 10–36)
Bilirubin Total: 0.8 mg/dL (ref 0.0–1.2)
CO2: 16 mmol/L — ABNORMAL LOW (ref 20–29)
Calcium: 9.6 mg/dL (ref 8.6–10.2)
Chloride: 101 mmol/L (ref 96–106)
Creatinine, Ser: 1.72 mg/dL — ABNORMAL HIGH (ref 0.76–1.27)
Globulin, Total: 3.3 g/dL (ref 1.5–4.5)
Glucose: 180 mg/dL — ABNORMAL HIGH (ref 70–99)
Potassium: 4.8 mmol/L (ref 3.5–5.2)
Sodium: 137 mmol/L (ref 134–144)
Total Protein: 7.1 g/dL (ref 6.0–8.5)
eGFR: 37 mL/min/{1.73_m2} — ABNORMAL LOW (ref >59)

## 2023-10-25 LAB — CBC WITH DIFFERENTIAL/PLATELET
Basophils Absolute: 0 10*3/uL (ref 0.0–0.2)
Basos: 0 %
EOS (ABSOLUTE): 0.1 10*3/uL (ref 0.0–0.4)
Eos: 1 %
Hematocrit: 37 % — ABNORMAL LOW (ref 37.5–51.0)
Hemoglobin: 13 g/dL (ref 13.0–17.7)
Immature Grans (Abs): 0 10*3/uL (ref 0.0–0.1)
Immature Granulocytes: 0 %
Lymphocytes Absolute: 1.9 10*3/uL (ref 0.7–3.1)
Lymphs: 16 %
MCH: 34.6 pg — ABNORMAL HIGH (ref 26.6–33.0)
MCHC: 35.1 g/dL (ref 31.5–35.7)
MCV: 98 fL — ABNORMAL HIGH (ref 79–97)
Monocytes Absolute: 1 10*3/uL — ABNORMAL HIGH (ref 0.1–0.9)
Monocytes: 9 %
Neutrophils Absolute: 8.5 10*3/uL — ABNORMAL HIGH (ref 1.4–7.0)
Neutrophils: 74 %
Platelets: 262 10*3/uL (ref 150–450)
RBC: 3.76 x10E6/uL — ABNORMAL LOW (ref 4.14–5.80)
RDW: 13.1 % (ref 11.6–15.4)
WBC: 11.6 10*3/uL — ABNORMAL HIGH (ref 3.4–10.8)

## 2023-11-05 ENCOUNTER — Other Ambulatory Visit: Payer: Self-pay | Admitting: Family Medicine

## 2023-11-05 DIAGNOSIS — R251 Tremor, unspecified: Secondary | ICD-10-CM

## 2023-11-06 ENCOUNTER — Other Ambulatory Visit: Payer: Self-pay | Admitting: Urology

## 2023-11-26 NOTE — Assessment & Plan Note (Signed)
 Tremor noted, continue primidone.  Reasonable and appropriate to work with home health physical therapy.

## 2023-12-01 DIAGNOSIS — Z981 Arthrodesis status: Secondary | ICD-10-CM | POA: Diagnosis not present

## 2023-12-01 DIAGNOSIS — N183 Chronic kidney disease, stage 3 unspecified: Secondary | ICD-10-CM | POA: Diagnosis not present

## 2023-12-01 DIAGNOSIS — E039 Hypothyroidism, unspecified: Secondary | ICD-10-CM | POA: Diagnosis not present

## 2023-12-01 DIAGNOSIS — N390 Urinary tract infection, site not specified: Secondary | ICD-10-CM | POA: Diagnosis not present

## 2023-12-01 DIAGNOSIS — M109 Gout, unspecified: Secondary | ICD-10-CM | POA: Diagnosis not present

## 2023-12-01 DIAGNOSIS — M1990 Unspecified osteoarthritis, unspecified site: Secondary | ICD-10-CM | POA: Diagnosis not present

## 2023-12-01 DIAGNOSIS — K219 Gastro-esophageal reflux disease without esophagitis: Secondary | ICD-10-CM | POA: Diagnosis not present

## 2023-12-01 DIAGNOSIS — H9193 Unspecified hearing loss, bilateral: Secondary | ICD-10-CM | POA: Diagnosis not present

## 2023-12-01 DIAGNOSIS — Z7982 Long term (current) use of aspirin: Secondary | ICD-10-CM | POA: Diagnosis not present

## 2023-12-01 DIAGNOSIS — E1122 Type 2 diabetes mellitus with diabetic chronic kidney disease: Secondary | ICD-10-CM | POA: Diagnosis not present

## 2023-12-01 DIAGNOSIS — D631 Anemia in chronic kidney disease: Secondary | ICD-10-CM | POA: Diagnosis not present

## 2023-12-01 DIAGNOSIS — Z85828 Personal history of other malignant neoplasm of skin: Secondary | ICD-10-CM | POA: Diagnosis not present

## 2023-12-01 DIAGNOSIS — E785 Hyperlipidemia, unspecified: Secondary | ICD-10-CM | POA: Diagnosis not present

## 2023-12-01 DIAGNOSIS — I4510 Unspecified right bundle-branch block: Secondary | ICD-10-CM | POA: Diagnosis not present

## 2023-12-01 DIAGNOSIS — Z8546 Personal history of malignant neoplasm of prostate: Secondary | ICD-10-CM | POA: Diagnosis not present

## 2023-12-01 DIAGNOSIS — Z792 Long term (current) use of antibiotics: Secondary | ICD-10-CM | POA: Diagnosis not present

## 2023-12-01 DIAGNOSIS — G250 Essential tremor: Secondary | ICD-10-CM | POA: Diagnosis not present

## 2023-12-01 DIAGNOSIS — N400 Enlarged prostate without lower urinary tract symptoms: Secondary | ICD-10-CM | POA: Diagnosis not present

## 2023-12-01 DIAGNOSIS — G959 Disease of spinal cord, unspecified: Secondary | ICD-10-CM | POA: Diagnosis not present

## 2023-12-01 DIAGNOSIS — M4802 Spinal stenosis, cervical region: Secondary | ICD-10-CM | POA: Diagnosis not present

## 2023-12-01 DIAGNOSIS — Z9181 History of falling: Secondary | ICD-10-CM | POA: Diagnosis not present

## 2023-12-01 DIAGNOSIS — G4700 Insomnia, unspecified: Secondary | ICD-10-CM | POA: Diagnosis not present

## 2023-12-01 DIAGNOSIS — I2510 Atherosclerotic heart disease of native coronary artery without angina pectoris: Secondary | ICD-10-CM | POA: Diagnosis not present

## 2023-12-01 DIAGNOSIS — Z951 Presence of aortocoronary bypass graft: Secondary | ICD-10-CM | POA: Diagnosis not present

## 2023-12-01 DIAGNOSIS — I129 Hypertensive chronic kidney disease with stage 1 through stage 4 chronic kidney disease, or unspecified chronic kidney disease: Secondary | ICD-10-CM | POA: Diagnosis not present

## 2023-12-09 ENCOUNTER — Other Ambulatory Visit: Payer: Self-pay | Admitting: Family Medicine

## 2023-12-09 DIAGNOSIS — N390 Urinary tract infection, site not specified: Secondary | ICD-10-CM | POA: Diagnosis not present

## 2023-12-09 DIAGNOSIS — D631 Anemia in chronic kidney disease: Secondary | ICD-10-CM | POA: Diagnosis not present

## 2023-12-09 DIAGNOSIS — E1122 Type 2 diabetes mellitus with diabetic chronic kidney disease: Secondary | ICD-10-CM | POA: Diagnosis not present

## 2023-12-09 DIAGNOSIS — I129 Hypertensive chronic kidney disease with stage 1 through stage 4 chronic kidney disease, or unspecified chronic kidney disease: Secondary | ICD-10-CM | POA: Diagnosis not present

## 2023-12-09 DIAGNOSIS — N183 Chronic kidney disease, stage 3 unspecified: Secondary | ICD-10-CM | POA: Diagnosis not present

## 2023-12-09 DIAGNOSIS — G250 Essential tremor: Secondary | ICD-10-CM | POA: Diagnosis not present

## 2023-12-16 DIAGNOSIS — E1122 Type 2 diabetes mellitus with diabetic chronic kidney disease: Secondary | ICD-10-CM | POA: Diagnosis not present

## 2023-12-16 DIAGNOSIS — I129 Hypertensive chronic kidney disease with stage 1 through stage 4 chronic kidney disease, or unspecified chronic kidney disease: Secondary | ICD-10-CM | POA: Diagnosis not present

## 2023-12-16 DIAGNOSIS — D631 Anemia in chronic kidney disease: Secondary | ICD-10-CM | POA: Diagnosis not present

## 2023-12-16 DIAGNOSIS — N390 Urinary tract infection, site not specified: Secondary | ICD-10-CM | POA: Diagnosis not present

## 2023-12-16 DIAGNOSIS — N183 Chronic kidney disease, stage 3 unspecified: Secondary | ICD-10-CM | POA: Diagnosis not present

## 2023-12-16 DIAGNOSIS — G250 Essential tremor: Secondary | ICD-10-CM | POA: Diagnosis not present

## 2023-12-26 DIAGNOSIS — E1122 Type 2 diabetes mellitus with diabetic chronic kidney disease: Secondary | ICD-10-CM | POA: Diagnosis not present

## 2023-12-26 DIAGNOSIS — D631 Anemia in chronic kidney disease: Secondary | ICD-10-CM | POA: Diagnosis not present

## 2023-12-26 DIAGNOSIS — N183 Chronic kidney disease, stage 3 unspecified: Secondary | ICD-10-CM | POA: Diagnosis not present

## 2023-12-26 DIAGNOSIS — N390 Urinary tract infection, site not specified: Secondary | ICD-10-CM | POA: Diagnosis not present

## 2023-12-26 DIAGNOSIS — I129 Hypertensive chronic kidney disease with stage 1 through stage 4 chronic kidney disease, or unspecified chronic kidney disease: Secondary | ICD-10-CM | POA: Diagnosis not present

## 2023-12-26 DIAGNOSIS — G250 Essential tremor: Secondary | ICD-10-CM | POA: Diagnosis not present

## 2023-12-30 DIAGNOSIS — D631 Anemia in chronic kidney disease: Secondary | ICD-10-CM | POA: Diagnosis not present

## 2023-12-30 DIAGNOSIS — N183 Chronic kidney disease, stage 3 unspecified: Secondary | ICD-10-CM | POA: Diagnosis not present

## 2023-12-30 DIAGNOSIS — I129 Hypertensive chronic kidney disease with stage 1 through stage 4 chronic kidney disease, or unspecified chronic kidney disease: Secondary | ICD-10-CM | POA: Diagnosis not present

## 2023-12-30 DIAGNOSIS — N390 Urinary tract infection, site not specified: Secondary | ICD-10-CM | POA: Diagnosis not present

## 2023-12-30 DIAGNOSIS — G250 Essential tremor: Secondary | ICD-10-CM | POA: Diagnosis not present

## 2023-12-30 DIAGNOSIS — E1122 Type 2 diabetes mellitus with diabetic chronic kidney disease: Secondary | ICD-10-CM | POA: Diagnosis not present

## 2023-12-31 DIAGNOSIS — N390 Urinary tract infection, site not specified: Secondary | ICD-10-CM | POA: Diagnosis not present

## 2023-12-31 DIAGNOSIS — Z981 Arthrodesis status: Secondary | ICD-10-CM | POA: Diagnosis not present

## 2023-12-31 DIAGNOSIS — G250 Essential tremor: Secondary | ICD-10-CM | POA: Diagnosis not present

## 2023-12-31 DIAGNOSIS — G959 Disease of spinal cord, unspecified: Secondary | ICD-10-CM | POA: Diagnosis not present

## 2023-12-31 DIAGNOSIS — Z7982 Long term (current) use of aspirin: Secondary | ICD-10-CM | POA: Diagnosis not present

## 2023-12-31 DIAGNOSIS — Z85828 Personal history of other malignant neoplasm of skin: Secondary | ICD-10-CM | POA: Diagnosis not present

## 2023-12-31 DIAGNOSIS — M109 Gout, unspecified: Secondary | ICD-10-CM | POA: Diagnosis not present

## 2023-12-31 DIAGNOSIS — H9193 Unspecified hearing loss, bilateral: Secondary | ICD-10-CM | POA: Diagnosis not present

## 2023-12-31 DIAGNOSIS — Z792 Long term (current) use of antibiotics: Secondary | ICD-10-CM | POA: Diagnosis not present

## 2023-12-31 DIAGNOSIS — Z951 Presence of aortocoronary bypass graft: Secondary | ICD-10-CM | POA: Diagnosis not present

## 2023-12-31 DIAGNOSIS — I4510 Unspecified right bundle-branch block: Secondary | ICD-10-CM | POA: Diagnosis not present

## 2023-12-31 DIAGNOSIS — M1990 Unspecified osteoarthritis, unspecified site: Secondary | ICD-10-CM | POA: Diagnosis not present

## 2023-12-31 DIAGNOSIS — E1122 Type 2 diabetes mellitus with diabetic chronic kidney disease: Secondary | ICD-10-CM | POA: Diagnosis not present

## 2023-12-31 DIAGNOSIS — I2510 Atherosclerotic heart disease of native coronary artery without angina pectoris: Secondary | ICD-10-CM | POA: Diagnosis not present

## 2023-12-31 DIAGNOSIS — Z9181 History of falling: Secondary | ICD-10-CM | POA: Diagnosis not present

## 2023-12-31 DIAGNOSIS — M4802 Spinal stenosis, cervical region: Secondary | ICD-10-CM | POA: Diagnosis not present

## 2023-12-31 DIAGNOSIS — D631 Anemia in chronic kidney disease: Secondary | ICD-10-CM | POA: Diagnosis not present

## 2023-12-31 DIAGNOSIS — G4700 Insomnia, unspecified: Secondary | ICD-10-CM | POA: Diagnosis not present

## 2023-12-31 DIAGNOSIS — K219 Gastro-esophageal reflux disease without esophagitis: Secondary | ICD-10-CM | POA: Diagnosis not present

## 2023-12-31 DIAGNOSIS — N400 Enlarged prostate without lower urinary tract symptoms: Secondary | ICD-10-CM | POA: Diagnosis not present

## 2023-12-31 DIAGNOSIS — N183 Chronic kidney disease, stage 3 unspecified: Secondary | ICD-10-CM | POA: Diagnosis not present

## 2023-12-31 DIAGNOSIS — E039 Hypothyroidism, unspecified: Secondary | ICD-10-CM | POA: Diagnosis not present

## 2023-12-31 DIAGNOSIS — E785 Hyperlipidemia, unspecified: Secondary | ICD-10-CM | POA: Diagnosis not present

## 2023-12-31 DIAGNOSIS — I129 Hypertensive chronic kidney disease with stage 1 through stage 4 chronic kidney disease, or unspecified chronic kidney disease: Secondary | ICD-10-CM | POA: Diagnosis not present

## 2023-12-31 DIAGNOSIS — Z8546 Personal history of malignant neoplasm of prostate: Secondary | ICD-10-CM | POA: Diagnosis not present

## 2024-01-06 DIAGNOSIS — D631 Anemia in chronic kidney disease: Secondary | ICD-10-CM | POA: Diagnosis not present

## 2024-01-06 DIAGNOSIS — I129 Hypertensive chronic kidney disease with stage 1 through stage 4 chronic kidney disease, or unspecified chronic kidney disease: Secondary | ICD-10-CM | POA: Diagnosis not present

## 2024-01-06 DIAGNOSIS — N183 Chronic kidney disease, stage 3 unspecified: Secondary | ICD-10-CM | POA: Diagnosis not present

## 2024-01-06 DIAGNOSIS — N390 Urinary tract infection, site not specified: Secondary | ICD-10-CM | POA: Diagnosis not present

## 2024-01-06 DIAGNOSIS — G250 Essential tremor: Secondary | ICD-10-CM | POA: Diagnosis not present

## 2024-01-06 DIAGNOSIS — E1122 Type 2 diabetes mellitus with diabetic chronic kidney disease: Secondary | ICD-10-CM | POA: Diagnosis not present

## 2024-01-15 DIAGNOSIS — N183 Chronic kidney disease, stage 3 unspecified: Secondary | ICD-10-CM | POA: Diagnosis not present

## 2024-01-15 DIAGNOSIS — D631 Anemia in chronic kidney disease: Secondary | ICD-10-CM | POA: Diagnosis not present

## 2024-01-15 DIAGNOSIS — I129 Hypertensive chronic kidney disease with stage 1 through stage 4 chronic kidney disease, or unspecified chronic kidney disease: Secondary | ICD-10-CM | POA: Diagnosis not present

## 2024-01-15 DIAGNOSIS — G250 Essential tremor: Secondary | ICD-10-CM | POA: Diagnosis not present

## 2024-01-15 DIAGNOSIS — N390 Urinary tract infection, site not specified: Secondary | ICD-10-CM | POA: Diagnosis not present

## 2024-01-15 DIAGNOSIS — E1122 Type 2 diabetes mellitus with diabetic chronic kidney disease: Secondary | ICD-10-CM | POA: Diagnosis not present

## 2024-01-27 DIAGNOSIS — I129 Hypertensive chronic kidney disease with stage 1 through stage 4 chronic kidney disease, or unspecified chronic kidney disease: Secondary | ICD-10-CM | POA: Diagnosis not present

## 2024-01-27 DIAGNOSIS — D631 Anemia in chronic kidney disease: Secondary | ICD-10-CM | POA: Diagnosis not present

## 2024-01-27 DIAGNOSIS — N183 Chronic kidney disease, stage 3 unspecified: Secondary | ICD-10-CM | POA: Diagnosis not present

## 2024-01-27 DIAGNOSIS — N390 Urinary tract infection, site not specified: Secondary | ICD-10-CM | POA: Diagnosis not present

## 2024-01-27 DIAGNOSIS — G250 Essential tremor: Secondary | ICD-10-CM | POA: Diagnosis not present

## 2024-01-27 DIAGNOSIS — E1122 Type 2 diabetes mellitus with diabetic chronic kidney disease: Secondary | ICD-10-CM | POA: Diagnosis not present

## 2024-01-28 ENCOUNTER — Emergency Department (HOSPITAL_COMMUNITY)

## 2024-01-28 ENCOUNTER — Encounter (HOSPITAL_COMMUNITY): Payer: Self-pay | Admitting: *Deleted

## 2024-01-28 ENCOUNTER — Inpatient Hospital Stay (HOSPITAL_COMMUNITY)
Admission: EM | Admit: 2024-01-28 | Discharge: 2024-02-03 | DRG: 312 | Disposition: A | Discharge status: Skilled Nursing Facility | Attending: Internal Medicine | Admitting: Internal Medicine

## 2024-01-28 ENCOUNTER — Other Ambulatory Visit: Payer: Self-pay

## 2024-01-28 DIAGNOSIS — S22089A Unspecified fracture of T11-T12 vertebra, initial encounter for closed fracture: Secondary | ICD-10-CM | POA: Diagnosis present

## 2024-01-28 DIAGNOSIS — Z961 Presence of intraocular lens: Secondary | ICD-10-CM | POA: Diagnosis present

## 2024-01-28 DIAGNOSIS — Z9049 Acquired absence of other specified parts of digestive tract: Secondary | ICD-10-CM

## 2024-01-28 DIAGNOSIS — M50123 Cervical disc disorder at C6-C7 level with radiculopathy: Secondary | ICD-10-CM | POA: Diagnosis present

## 2024-01-28 DIAGNOSIS — I4891 Unspecified atrial fibrillation: Secondary | ICD-10-CM | POA: Diagnosis present

## 2024-01-28 DIAGNOSIS — R338 Other retention of urine: Secondary | ICD-10-CM | POA: Diagnosis present

## 2024-01-28 DIAGNOSIS — R55 Syncope and collapse: Principal | ICD-10-CM

## 2024-01-28 DIAGNOSIS — Z809 Family history of malignant neoplasm, unspecified: Secondary | ICD-10-CM

## 2024-01-28 DIAGNOSIS — Y92091 Bathroom in other non-institutional residence as the place of occurrence of the external cause: Secondary | ICD-10-CM

## 2024-01-28 DIAGNOSIS — Z833 Family history of diabetes mellitus: Secondary | ICD-10-CM

## 2024-01-28 DIAGNOSIS — N1832 Chronic kidney disease, stage 3b: Secondary | ICD-10-CM | POA: Diagnosis present

## 2024-01-28 DIAGNOSIS — I951 Orthostatic hypotension: Secondary | ICD-10-CM | POA: Diagnosis not present

## 2024-01-28 DIAGNOSIS — R001 Bradycardia, unspecified: Secondary | ICD-10-CM | POA: Diagnosis present

## 2024-01-28 DIAGNOSIS — Z8546 Personal history of malignant neoplasm of prostate: Secondary | ICD-10-CM

## 2024-01-28 DIAGNOSIS — Z981 Arthrodesis status: Secondary | ICD-10-CM

## 2024-01-28 DIAGNOSIS — I129 Hypertensive chronic kidney disease with stage 1 through stage 4 chronic kidney disease, or unspecified chronic kidney disease: Secondary | ICD-10-CM | POA: Diagnosis present

## 2024-01-28 DIAGNOSIS — N183 Chronic kidney disease, stage 3 unspecified: Secondary | ICD-10-CM | POA: Diagnosis present

## 2024-01-28 DIAGNOSIS — S0083XA Contusion of other part of head, initial encounter: Secondary | ICD-10-CM | POA: Diagnosis present

## 2024-01-28 DIAGNOSIS — R58 Hemorrhage, not elsewhere classified: Secondary | ICD-10-CM | POA: Diagnosis not present

## 2024-01-28 DIAGNOSIS — M79602 Pain in left arm: Secondary | ICD-10-CM | POA: Diagnosis present

## 2024-01-28 DIAGNOSIS — Y9301 Activity, walking, marching and hiking: Secondary | ICD-10-CM | POA: Diagnosis present

## 2024-01-28 DIAGNOSIS — Z79899 Other long term (current) drug therapy: Secondary | ICD-10-CM

## 2024-01-28 DIAGNOSIS — Z66 Do not resuscitate: Secondary | ICD-10-CM | POA: Diagnosis present

## 2024-01-28 DIAGNOSIS — M109 Gout, unspecified: Secondary | ICD-10-CM | POA: Diagnosis present

## 2024-01-28 DIAGNOSIS — Z8042 Family history of malignant neoplasm of prostate: Secondary | ICD-10-CM

## 2024-01-28 DIAGNOSIS — G8929 Other chronic pain: Secondary | ICD-10-CM | POA: Diagnosis present

## 2024-01-28 DIAGNOSIS — I480 Paroxysmal atrial fibrillation: Secondary | ICD-10-CM | POA: Diagnosis not present

## 2024-01-28 DIAGNOSIS — R269 Unspecified abnormalities of gait and mobility: Secondary | ICD-10-CM | POA: Diagnosis present

## 2024-01-28 DIAGNOSIS — Z9842 Cataract extraction status, left eye: Secondary | ICD-10-CM

## 2024-01-28 DIAGNOSIS — K219 Gastro-esophageal reflux disease without esophagitis: Secondary | ICD-10-CM | POA: Diagnosis present

## 2024-01-28 DIAGNOSIS — G250 Essential tremor: Secondary | ICD-10-CM | POA: Diagnosis present

## 2024-01-28 DIAGNOSIS — R531 Weakness: Secondary | ICD-10-CM

## 2024-01-28 DIAGNOSIS — I493 Ventricular premature depolarization: Secondary | ICD-10-CM | POA: Diagnosis present

## 2024-01-28 DIAGNOSIS — K5900 Constipation, unspecified: Secondary | ICD-10-CM | POA: Diagnosis present

## 2024-01-28 DIAGNOSIS — Z87440 Personal history of urinary (tract) infections: Secondary | ICD-10-CM

## 2024-01-28 DIAGNOSIS — M549 Dorsalgia, unspecified: Secondary | ICD-10-CM | POA: Diagnosis present

## 2024-01-28 DIAGNOSIS — Z8701 Personal history of pneumonia (recurrent): Secondary | ICD-10-CM

## 2024-01-28 DIAGNOSIS — I4510 Unspecified right bundle-branch block: Secondary | ICD-10-CM | POA: Diagnosis present

## 2024-01-28 DIAGNOSIS — R339 Retention of urine, unspecified: Secondary | ICD-10-CM | POA: Diagnosis present

## 2024-01-28 DIAGNOSIS — W1830XA Fall on same level, unspecified, initial encounter: Secondary | ICD-10-CM | POA: Diagnosis present

## 2024-01-28 DIAGNOSIS — Z9889 Other specified postprocedural states: Secondary | ICD-10-CM

## 2024-01-28 DIAGNOSIS — N401 Enlarged prostate with lower urinary tract symptoms: Secondary | ICD-10-CM | POA: Diagnosis present

## 2024-01-28 DIAGNOSIS — I10 Essential (primary) hypertension: Secondary | ICD-10-CM

## 2024-01-28 DIAGNOSIS — Z85828 Personal history of other malignant neoplasm of skin: Secondary | ICD-10-CM

## 2024-01-28 DIAGNOSIS — M79601 Pain in right arm: Secondary | ICD-10-CM | POA: Diagnosis present

## 2024-01-28 DIAGNOSIS — E1122 Type 2 diabetes mellitus with diabetic chronic kidney disease: Secondary | ICD-10-CM | POA: Diagnosis present

## 2024-01-28 DIAGNOSIS — Z8249 Family history of ischemic heart disease and other diseases of the circulatory system: Secondary | ICD-10-CM

## 2024-01-28 DIAGNOSIS — I2510 Atherosclerotic heart disease of native coronary artery without angina pectoris: Secondary | ICD-10-CM | POA: Diagnosis present

## 2024-01-28 DIAGNOSIS — E871 Hypo-osmolality and hyponatremia: Secondary | ICD-10-CM | POA: Diagnosis present

## 2024-01-28 DIAGNOSIS — Z7982 Long term (current) use of aspirin: Secondary | ICD-10-CM

## 2024-01-28 DIAGNOSIS — N400 Enlarged prostate without lower urinary tract symptoms: Secondary | ICD-10-CM | POA: Diagnosis present

## 2024-01-28 DIAGNOSIS — S0033XA Contusion of nose, initial encounter: Secondary | ICD-10-CM | POA: Diagnosis present

## 2024-01-28 DIAGNOSIS — Z951 Presence of aortocoronary bypass graft: Secondary | ICD-10-CM

## 2024-01-28 DIAGNOSIS — M4802 Spinal stenosis, cervical region: Secondary | ICD-10-CM | POA: Diagnosis present

## 2024-01-28 DIAGNOSIS — E782 Mixed hyperlipidemia: Secondary | ICD-10-CM | POA: Diagnosis present

## 2024-01-28 DIAGNOSIS — Z604 Social exclusion and rejection: Secondary | ICD-10-CM | POA: Diagnosis present

## 2024-01-28 DIAGNOSIS — Z860100 Personal history of colon polyps, unspecified: Secondary | ICD-10-CM

## 2024-01-28 DIAGNOSIS — I440 Atrioventricular block, first degree: Secondary | ICD-10-CM | POA: Diagnosis present

## 2024-01-28 DIAGNOSIS — S0081XA Abrasion of other part of head, initial encounter: Secondary | ICD-10-CM | POA: Diagnosis present

## 2024-01-28 LAB — CBC WITH DIFFERENTIAL/PLATELET
Abs Immature Granulocytes: 0.03 10*3/uL (ref 0.00–0.07)
Basophils Absolute: 0 10*3/uL (ref 0.0–0.1)
Basophils Relative: 1 %
Eosinophils Absolute: 0.3 10*3/uL (ref 0.0–0.5)
Eosinophils Relative: 5 %
HCT: 35.2 % — ABNORMAL LOW (ref 39.0–52.0)
Hemoglobin: 12.1 g/dL — ABNORMAL LOW (ref 13.0–17.0)
Immature Granulocytes: 1 %
Lymphocytes Relative: 22 %
Lymphs Abs: 1.4 10*3/uL (ref 0.7–4.0)
MCH: 35 pg — ABNORMAL HIGH (ref 26.0–34.0)
MCHC: 34.4 g/dL (ref 30.0–36.0)
MCV: 101.7 fL — ABNORMAL HIGH (ref 80.0–100.0)
Monocytes Absolute: 0.5 10*3/uL (ref 0.1–1.0)
Monocytes Relative: 8 %
Neutro Abs: 4 10*3/uL (ref 1.7–7.7)
Neutrophils Relative %: 63 %
Platelets: 172 10*3/uL (ref 150–400)
RBC: 3.46 MIL/uL — ABNORMAL LOW (ref 4.22–5.81)
RDW: 14.4 % (ref 11.5–15.5)
WBC: 6.2 10*3/uL (ref 4.0–10.5)
nRBC: 0 % (ref 0.0–0.2)

## 2024-01-28 LAB — COMPREHENSIVE METABOLIC PANEL WITH GFR
ALT: 17 U/L (ref 0–44)
AST: 19 U/L (ref 15–41)
Albumin: 3 g/dL — ABNORMAL LOW (ref 3.5–5.0)
Alkaline Phosphatase: 52 U/L (ref 38–126)
Anion gap: 5 (ref 5–15)
BUN: 45 mg/dL — ABNORMAL HIGH (ref 8–23)
CO2: 22 mmol/L (ref 22–32)
Calcium: 9 mg/dL (ref 8.9–10.3)
Chloride: 107 mmol/L (ref 98–111)
Creatinine, Ser: 1.66 mg/dL — ABNORMAL HIGH (ref 0.61–1.24)
GFR, Estimated: 38 mL/min — ABNORMAL LOW (ref >60)
Glucose, Bld: 160 mg/dL — ABNORMAL HIGH (ref 70–99)
Potassium: 4.9 mmol/L (ref 3.5–5.1)
Sodium: 134 mmol/L — ABNORMAL LOW (ref 135–145)
Total Bilirubin: 1 mg/dL (ref 0.0–1.2)
Total Protein: 6.2 g/dL — ABNORMAL LOW (ref 6.5–8.1)

## 2024-01-28 LAB — CBG MONITORING, ED: Glucose-Capillary: 167 mg/dL — ABNORMAL HIGH (ref 70–99)

## 2024-01-28 LAB — TROPONIN I (HIGH SENSITIVITY)
Troponin I (High Sensitivity): 11 ng/L (ref <18)
Troponin I (High Sensitivity): 11 ng/L (ref <18)

## 2024-01-28 LAB — TSH: TSH: 3.66 u[IU]/mL (ref 0.350–4.500)

## 2024-01-28 LAB — CK: Total CK: 117 U/L (ref 49–397)

## 2024-01-28 MED ORDER — ATORVASTATIN CALCIUM 40 MG PO TABS
40.0000 mg | ORAL_TABLET | Freq: Every day | ORAL | Status: DC
  Administered 2024-01-29 – 2024-02-03 (×6): 40 mg via ORAL
  Filled 2024-01-28 (×6): qty 1

## 2024-01-28 MED ORDER — HYDROCODONE-ACETAMINOPHEN 5-325 MG PO TABS
1.0000 | ORAL_TABLET | Freq: Once | ORAL | Status: AC
  Administered 2024-01-28: 1 via ORAL
  Filled 2024-01-28: qty 1

## 2024-01-28 MED ORDER — SODIUM CHLORIDE 0.9 % IV SOLN: INTRAVENOUS | Status: AC

## 2024-01-28 MED ORDER — TAMSULOSIN HCL 0.4 MG PO CAPS
0.4000 mg | ORAL_CAPSULE | Freq: Every day | ORAL | Status: DC
  Administered 2024-01-28 – 2024-02-02 (×6): 0.4 mg via ORAL
  Filled 2024-01-28 (×6): qty 1

## 2024-01-28 MED ORDER — ASPIRIN 81 MG PO TBEC
81.0000 mg | DELAYED_RELEASE_TABLET | Freq: Every day | ORAL | Status: DC
  Administered 2024-01-29 – 2024-02-03 (×6): 81 mg via ORAL
  Filled 2024-01-28 (×10): qty 1

## 2024-01-28 MED ORDER — ACETAMINOPHEN 500 MG PO TABS
1000.0000 mg | ORAL_TABLET | Freq: Three times a day (TID) | ORAL | Status: DC | PRN
  Administered 2024-01-28: 1000 mg via ORAL
  Filled 2024-01-28: qty 2

## 2024-01-28 MED ORDER — CEPHALEXIN 250 MG PO CAPS
250.0000 mg | ORAL_CAPSULE | Freq: Three times a day (TID) | ORAL | Status: DC
  Administered 2024-01-28 – 2024-02-03 (×16): 250 mg via ORAL
  Filled 2024-01-28 (×24): qty 1

## 2024-01-28 MED ORDER — LISINOPRIL 10 MG PO TABS
20.0000 mg | ORAL_TABLET | Freq: Every day | ORAL | Status: DC
  Administered 2024-01-29 – 2024-01-31 (×3): 20 mg via ORAL
  Filled 2024-01-28 (×3): qty 2

## 2024-01-28 MED ORDER — MELATONIN 3 MG PO TABS
3.0000 mg | ORAL_TABLET | Freq: Every day | ORAL | Status: DC
  Administered 2024-01-28 – 2024-02-02 (×6): 3 mg via ORAL
  Filled 2024-01-28 (×6): qty 1

## 2024-01-28 MED ORDER — SENNOSIDES-DOCUSATE SODIUM 8.6-50 MG PO TABS
1.0000 | ORAL_TABLET | Freq: Every day | ORAL | Status: DC
  Administered 2024-01-28 – 2024-02-03 (×7): 1 via ORAL
  Filled 2024-01-28 (×7): qty 1

## 2024-01-28 MED ORDER — SODIUM CHLORIDE 0.9 % IV BOLUS
500.0000 mL | Freq: Once | INTRAVENOUS | Status: AC
  Administered 2024-01-28: 500 mL via INTRAVENOUS

## 2024-01-28 MED ORDER — MIRTAZAPINE 15 MG PO TABS
7.5000 mg | ORAL_TABLET | Freq: Every day | ORAL | Status: DC
  Administered 2024-01-28 – 2024-02-02 (×6): 7.5 mg via ORAL
  Filled 2024-01-28 (×6): qty 1

## 2024-01-28 MED ORDER — BACITRACIN ZINC 500 UNIT/GM EX OINT
TOPICAL_OINTMENT | Freq: Once | CUTANEOUS | Status: AC
Start: 2024-01-28 — End: 2024-01-28
  Administered 2024-01-28: 1 via TOPICAL
  Filled 2024-01-28: qty 0.9

## 2024-01-28 MED ORDER — KETOROLAC TROMETHAMINE 15 MG/ML IJ SOLN
15.0000 mg | Freq: Once | INTRAMUSCULAR | Status: AC
  Administered 2024-01-28: 15 mg via INTRAVENOUS
  Filled 2024-01-28: qty 1

## 2024-01-28 MED ORDER — PRIMIDONE 50 MG PO TABS
25.0000 mg | ORAL_TABLET | Freq: Every day | ORAL | Status: DC
  Administered 2024-01-28 – 2024-02-03 (×7): 25 mg via ORAL
  Filled 2024-01-28 (×7): qty 1

## 2024-01-28 MED ORDER — ALLOPURINOL 100 MG PO TABS
300.0000 mg | ORAL_TABLET | Freq: Every day | ORAL | Status: DC
  Administered 2024-01-29 – 2024-02-03 (×6): 300 mg via ORAL
  Filled 2024-01-28 (×6): qty 3

## 2024-01-28 MED ORDER — HEPARIN SODIUM (PORCINE) 5000 UNIT/ML IJ SOLN
5000.0000 [IU] | Freq: Three times a day (TID) | INTRAMUSCULAR | Status: DC
  Administered 2024-01-29 – 2024-02-03 (×15): 5000 [IU] via SUBCUTANEOUS
  Filled 2024-01-28 (×15): qty 1

## 2024-01-28 NOTE — ED Notes (Signed)
 Patient informed of need for urine sample. Pt reports that he does not feel the need to go at this time. Urinal at bedside, call bell within reach.

## 2024-01-28 NOTE — ED Provider Notes (Signed)
 [facility] MEDICAL SURGICAL UNIT Provider Note  CSN: [id number] Arrival date & time: 01/28/24 1137  Chief Complaint(s) Loss of Consciousness  HPI [name] [name] is a 89 y.o. male with past medical history as below, significant for CAD, HLD, HTN, CKD, T2DM  who presents to the ED with complaint of fall.  Patient reports he attempted to stand up and his legs gave out and he fell down.  Does not believe he had LOC but he is not sure.  He hit his face on the ground.  Did not have any chest pain or palpitations, lightheadedness nausea or vomiting prior to the episode.  Unable to get up off the floor and EMS was dispatched.  Currently he is complaining of diffuse pain, headache.  No nausea or chest pain.  He is having some paresthesias to his fingertips and toes, some pain to his neck posteriorly.  Does report history of prior neck surgery.   Past Medical History Past Medical History:  Diagnosis Date   Acute renal failure (HCC) 12/06/2013   Back pain    occasionally   Basal cell carcinoma (BCC) of antihelix of left ear    Benign essential tremor 2005   takes Primidone  daily   Choledocholithiasis 12/06/2013   Constipation    takes OTC stool softener   Coronary artery disease 2007   s/p CABG, DR Stann Earnest   GERD (gastroesophageal reflux disease)    takes Nexium  daily   Gout 1995   "~ once/yr" (03/17/2014)   History of colon polyps    Hyperlipidemia 12/2005   takes Atorvastatin  daily   Hypertension 1995   takes Lisinopril  and Coreg  daily   possible HCAP (healthcare-associated pneumonia) 12/09/2013   Prostate cancer (HCC) 2007   S/P treatment, followed by Urology prev   Right bundle branch block (RBBB)    stage 3b chronic kidney disease (HCC)    Stenosis of cervical spine    Type II diabetes mellitus (HCC)    no meds;diet and exercise controlled    Wears dentures    Wears glasses    Patient Active Problem List   Diagnosis Date Noted   Syncope 01/28/2024   Cough 09/09/2023    Other fatigue 06/11/2023   Dysuria 01/08/2023   BPH (benign prostatic hyperplasia) 12/28/2022   Failure to thrive in adult 12/28/2022   Skin lesion 08/21/2022   History of prostate cancer 10/10/2021   Acute on chronic urinary retention 10/10/2021   Cystitis 10/10/2021   DNR (do not resuscitate) 06/25/2021   Constipation 07/10/2020   Sleep disturbance 07/10/2020   Abnormality of gait 07/10/2020   Insomnia 06/12/2020   Acute renal failure superimposed on stage 3b chronic kidney disease (HCC)    Hyponatremia    Hypoalbuminemia due to protein-calorie malnutrition (HCC)    Transaminitis    Acute on chronic anemia    Stenosis of cervical spine with myelopathy (HCC) 05/04/2020   S/P cervical spinal fusion 05/01/2020   History of agent Orange exposure 04/05/2020   Paresthesia 01/20/2020   Health care maintenance 12/28/2017   GERD (gastroesophageal reflux disease) 12/28/2017   Medicare annual wellness visit, subsequent 10/30/2014   Advance care planning 10/30/2014   UTI (urinary tract infection) 04/19/2014   Right bundle branch block (RBBB)    Wheezing 12/09/2013   CKD (chronic kidney disease), stage III (HCC) 12/06/2013   Back pain 10/20/2011   Hypothyroidism 03/23/2009   Vitamin D deficiency 03/23/2009   Tremor 12/18/2006   CAD (coronary artery disease) 12/18/2006  Mixed hyperlipidemia 12/08/2005   History of diabetes mellitus 02/07/2005   Gout 09/09/1993   Essential hypertension 09/09/1993   Home Medication(s) Prior to Admission medications   Medication Sig Start Date End Date Taking? Authorizing Provider  acetaminophen  (TYLENOL ) 500 MG tablet Take 2 tablets (1,000 mg total) by mouth 3 (three) times daily as needed for mild pain or moderate pain. 06/06/20  Yes Donnie Galea, MD  allopurinol  (ZYLOPRIM ) 300 MG tablet Take 1 tablet (300 mg total) by mouth daily. 05/14/23  Yes Donnie Galea, MD  aspirin  81 MG tablet Take 81 mg by mouth daily.   Yes [provider]   atorvastatin  (LIPITOR) 40 MG tablet TAKE 1 TABLET DAILY 05/07/23  Yes Donnie Galea, MD  carvedilol  (COREG  CR) 20 MG 24 hr capsule Take 1 capsule (20 mg total) by mouth daily. 05/14/23  Yes Donnie Galea, MD  lisinopril  (ZESTRIL ) 20 MG tablet Take 1 tablet (20 mg total) by mouth daily. 09/08/23  Yes Donnie Galea, MD  melatonin 3 MG TABS tablet Take 1 tablet (3 mg total) by mouth at bedtime. 05/17/20  Yes Love, Renay Carota, PA-C  mirtazapine  (REMERON ) 7.5 MG tablet TAKE 1 TABLET(7.5 MG) BY MOUTH AT BEDTIME. STOP TRAZODONE  WITH MIRTAZAPINE  USE 12/09/23  Yes Donnie Galea, MD  omeprazole  (PRILOSEC) 40 MG capsule TAKE 1 CAPSULE DAILY AS NEEDED Patient taking differently: Take 40 mg by mouth daily as needed (heartburn, acid reflux, indigestion). 04/04/23  Yes Donnie Galea, MD  primidone  (MYSOLINE ) 50 MG tablet TAKE ONE-HALF (1/2) TABLET DAILY 05/07/23  Yes Donnie Galea, MD  senna-docusate (SENOKOT-S) 8.6-50 MG tablet Take 1-2 tablets by mouth daily. 06/22/21  Yes Donnie Galea, MD  silodosin  (RAPAFLO ) 8 MG CAPS capsule TAKE 1 CAPSULE AT BEDTIME 11/06/23  Yes McKenzie, Arden Beck, MD                                                                                                                                    Past Surgical History Past Surgical History:  Procedure Laterality Date   Adenosine myoview   01/24/2006   Ischemia by EKG, Cath   CATARACT EXTRACTION W/ INTRAOCULAR LENS IMPLANT Left 10/2013   CHOLECYSTECTOMY N/A 12/06/2013   Procedure: Diagnostic Laparoscopy for  drainage Intrabdominal abcess;  Surgeon: Enid Harry, MD;  Location: [facility] OR;  Service: General;  Laterality: N/A;   CHOLECYSTECTOMY N/A 03/17/2014   Procedure: LAPAROSCOPIC CHOLECYSTECTOMY ;  Surgeon: Enid Harry, MD;  Location: [facility] OR;  Service: General;  Laterality: N/A;   COLONOSCOPY     CORONARY ARTERY BYPASS GRAFT  2007   CABGX 4   CYSTOSCOPY  02/15/2004   Mod BPH, moder tribec ?   ERCP N/A 12/06/2013    Procedure: ENDOSCOPIC RETROGRADE CHOLANGIOPANCREATOGRAPHY (ERCP);  Surgeon: Janel Medford, MD;  Location: [facility] OR;  Service: Endoscopy;  Laterality: N/A;   LAPAROSCOPIC CHOLECYSTECTOMY  03/17/2014   LUMBAR  FUSION  02/2012   lumbar spine for spinal stenosis   POSTERIOR CERVICAL FUSION/FORAMINOTOMY N/A 05/01/2020   Procedure: Posterior cervical fusion with lateral mass fixation - Cervical three - Cervical six, laminectomy Cervical three-six;  Surgeon: Isadora Mar, MD;  Location: [facility] OR;  Service: Neurosurgery;  Laterality: N/A;   PROSTATE CRYOABLATION  09/18/2005   prostate CA   Family History Family History  Problem Relation Age of Onset   Heart disease Mother    Cancer Mother    Cancer Sister    Diabetes Sister    Cancer Brother        prostate   Prostate cancer Brother    Hypothyroidism Brother    Depression Neg Hx    Alcohol abuse Neg Hx    Drug abuse Neg Hx    Stroke Neg Hx    Colon cancer Neg Hx     Social History Social History   Tobacco Use   Smoking status: Never   Smokeless tobacco: Never  Vaping Use   Vaping status: Never Used  Substance Use Topics   Alcohol use: No   Drug use: No   Allergies Patient has no known allergies.  Review of Systems A thorough review of systems was obtained and all systems are negative except as noted in the HPI and PMH.   Physical Exam Vital Signs  I have reviewed the triage vital signs BP 137/70 (BP Location: Right Arm)   Pulse (!) 56   Temp 97.8 F (36.6 C) (Oral)   Resp 16   Ht 5\' 11"  (1.803 m)   Wt 82.6 kg   SpO2 96%   BMI 25.40 kg/m  Physical Exam Vitals and nursing note reviewed.  Constitutional:      General: He is not in acute distress.    Appearance: He is well-developed.  HENT:     Head: Normocephalic.     Comments: Contusion noted to nasal bridge, abrasion to forehead and nasal bridge    Right Ear: External ear normal.     Left Ear: External ear normal.     Mouth/Throat:     Mouth: Mucous membranes are  moist.  Eyes:     General: No scleral icterus.    Extraocular Movements: Extraocular movements intact.     Pupils: Pupils are equal, round, and reactive to light.  Cardiovascular:     Rate and Rhythm: Normal rate and regular rhythm.     Pulses: Normal pulses.     Heart sounds: Normal heart sounds.  Pulmonary:     Effort: Pulmonary effort is normal. No respiratory distress.     Breath sounds: Normal breath sounds.  Abdominal:     General: Abdomen is flat.     Palpations: Abdomen is soft.     Tenderness: There is no abdominal tenderness.  Musculoskeletal:     Cervical back: No rigidity. Pain with movement present.     Right lower leg: No edema.     Left lower leg: No edema.  Skin:    General: Skin is warm and dry.     Capillary Refill: Capillary refill takes less than 2 seconds.  Neurological:     Mental Status: He is alert and oriented to person, place, and time.     GCS: GCS eye subscore is 4. GCS verbal subscore is 5. GCS motor subscore is 6.     Cranial Nerves: Cranial nerves 2-12 are intact.     Sensory: Sensation is intact.     Motor:  Motor function is intact.     Coordination: Coordination is intact.     Comments: Gait testing deferred secondary to patient safety. Strength 5/5 to BLUE/BLLE, equal and symmetric  Paresthesias noted to fingertips and his toes  Psychiatric:        Mood and Affect: Mood normal.        Behavior: Behavior normal.     ED Results and Treatments Labs (all labs ordered are listed, but only abnormal results are displayed) Labs Reviewed  CBC WITH DIFFERENTIAL/PLATELET - Abnormal; Notable for the following components:      Result Value   RBC 3.46 (*)    Hemoglobin 12.1 (*)    HCT 35.2 (*)    MCV 101.7 (*)    MCH 35.0 (*)    All other components within normal limits  COMPREHENSIVE METABOLIC PANEL WITH GFR - Abnormal; Notable for the following components:   Sodium 134 (*)    Glucose, Bld 160 (*)    BUN 45 (*)    Creatinine, Ser 1.66 (*)     Total Protein 6.2 (*)    Albumin  3.0 (*)    GFR, Estimated 38 (*)    All other components within normal limits  BASIC METABOLIC PANEL WITH GFR - Abnormal; Notable for the following components:   CO2 21 (*)    BUN 54 (*)    Creatinine, Ser 1.63 (*)    GFR, Estimated 39 (*)    All other components within normal limits  CBC - Abnormal; Notable for the following components:   RBC 3.31 (*)    Hemoglobin 11.6 (*)    HCT 33.5 (*)    MCV 101.2 (*)    MCH 35.0 (*)    All other components within normal limits  CBG MONITORING, ED - Abnormal; Notable for the following components:   Glucose-Capillary 167 (*)    All other components within normal limits  CK  TSH  URINALYSIS, ROUTINE W REFLEX MICROSCOPIC  TROPONIN I (HIGH SENSITIVITY)  TROPONIN I (HIGH SENSITIVITY)                                                                                                                          Radiology DG Pelvis Portable Result Date: 01/28/2024 CLINICAL DATA:  Fall. EXAM: PORTABLE PELVIS 1-2 VIEWS COMPARISON:  None Available. FINDINGS: There is no evidence of pelvic fracture or diastasis. No pelvic bone lesions are seen. IMPRESSION: Negative. Electronically Signed   By: Rosalene Colon M.D.   On: 01/28/2024 14:46   DG Chest Portable 1 View Result Date: 01/28/2024 CLINICAL DATA:  Fall. EXAM: PORTABLE CHEST 1 VIEW COMPARISON:  October 24, 2023. FINDINGS: Stable cardiomegaly. Status post coronary artery bypass graft. No acute pulmonary disease. Bony thorax is unremarkable. IMPRESSION: No active disease. Electronically Signed   By: Rosalene Colon M.D.   On: 01/28/2024 14:45   CT Head Wo Contrast Result Date: 01/28/2024 CLINICAL DATA:  Syncope, fall, head injury EXAM: CT  HEAD WITHOUT CONTRAST CT MAXILLOFACIAL WITHOUT CONTRAST CT CERVICAL SPINE WITHOUT CONTRAST TECHNIQUE: Multidetector CT imaging of the head, cervical spine, and maxillofacial structures were performed using the standard protocol without  intravenous contrast. Multiplanar CT image reconstructions of the cervical spine and maxillofacial structures were also generated. RADIATION DOSE REDUCTION: This exam was performed according to the departmental dose-optimization program which includes automated exposure control, adjustment of the mA and/or kV according to patient size and/or use of iterative reconstruction technique. COMPARISON:  08/13/2022 FINDINGS: CT HEAD FINDINGS Brain: No evidence of acute infarction, hemorrhage, hydrocephalus, extra-axial collection or mass lesion/mass effect. Extensive periventricular and deep white matter hypodensity. Vascular: No hyperdense vessel or unexpected calcification. CT FACIAL BONES FINDINGS Skull: Normal. Negative for fracture or focal lesion. Facial bones: No displaced fractures or dislocations. Sinuses/Orbits: No acute finding. Other: Patient is edentulous. CT CERVICAL SPINE FINDINGS Alignment: Postoperative straightening of the normal cervical lordosis. Skull base and vertebrae: No acute fracture. No primary bone lesion or focal pathologic process. Soft tissues and spinal canal: No prevertebral fluid or swelling. No visible canal hematoma. Disc levels: Status post posterior laminectomy and fusion of C3-C6. Mild-to-moderate multilevel cervical disc degenerative change, worst at C6-C7. Upper chest: Negative. Other: None. IMPRESSION: 1. No acute intracranial pathology. Advanced small-vessel white matter disease, in keeping with patient age. 2. No displaced fractures or dislocations of the facial bones. 3. No fracture or static subluxation of the cervical spine. 4. Status post posterior laminectomy and fusion of C3-C6. Mild-to-moderate multilevel cervical disc degenerative change, worst at C6-C7. Electronically Signed   By: Fredricka Jenny M.D.   On: 01/28/2024 13:27   CT Maxillofacial Wo Contrast Result Date: 01/28/2024 CLINICAL DATA:  Syncope, fall, head injury EXAM: CT HEAD WITHOUT CONTRAST CT MAXILLOFACIAL  WITHOUT CONTRAST CT CERVICAL SPINE WITHOUT CONTRAST TECHNIQUE: Multidetector CT imaging of the head, cervical spine, and maxillofacial structures were performed using the standard protocol without intravenous contrast. Multiplanar CT image reconstructions of the cervical spine and maxillofacial structures were also generated. RADIATION DOSE REDUCTION: This exam was performed according to the departmental dose-optimization program which includes automated exposure control, adjustment of the mA and/or kV according to patient size and/or use of iterative reconstruction technique. COMPARISON:  08/13/2022 FINDINGS: CT HEAD FINDINGS Brain: No evidence of acute infarction, hemorrhage, hydrocephalus, extra-axial collection or mass lesion/mass effect. Extensive periventricular and deep white matter hypodensity. Vascular: No hyperdense vessel or unexpected calcification. CT FACIAL BONES FINDINGS Skull: Normal. Negative for fracture or focal lesion. Facial bones: No displaced fractures or dislocations. Sinuses/Orbits: No acute finding. Other: Patient is edentulous. CT CERVICAL SPINE FINDINGS Alignment: Postoperative straightening of the normal cervical lordosis. Skull base and vertebrae: No acute fracture. No primary bone lesion or focal pathologic process. Soft tissues and spinal canal: No prevertebral fluid or swelling. No visible canal hematoma. Disc levels: Status post posterior laminectomy and fusion of C3-C6. Mild-to-moderate multilevel cervical disc degenerative change, worst at C6-C7. Upper chest: Negative. Other: None. IMPRESSION: 1. No acute intracranial pathology. Advanced small-vessel white matter disease, in keeping with patient age. 2. No displaced fractures or dislocations of the facial bones. 3. No fracture or static subluxation of the cervical spine. 4. Status post posterior laminectomy and fusion of C3-C6. Mild-to-moderate multilevel cervical disc degenerative change, worst at C6-C7. Electronically Signed    By: Fredricka Jenny M.D.   On: 01/28/2024 13:27   CT Cervical Spine Wo Contrast Result Date: 01/28/2024 CLINICAL DATA:  Syncope, fall, head injury EXAM: CT HEAD WITHOUT CONTRAST CT MAXILLOFACIAL WITHOUT CONTRAST CT  CERVICAL SPINE WITHOUT CONTRAST TECHNIQUE: Multidetector CT imaging of the head, cervical spine, and maxillofacial structures were performed using the standard protocol without intravenous contrast. Multiplanar CT image reconstructions of the cervical spine and maxillofacial structures were also generated. RADIATION DOSE REDUCTION: This exam was performed according to the departmental dose-optimization program which includes automated exposure control, adjustment of the mA and/or kV according to patient size and/or use of iterative reconstruction technique. COMPARISON:  08/13/2022 FINDINGS: CT HEAD FINDINGS Brain: No evidence of acute infarction, hemorrhage, hydrocephalus, extra-axial collection or mass lesion/mass effect. Extensive periventricular and deep white matter hypodensity. Vascular: No hyperdense vessel or unexpected calcification. CT FACIAL BONES FINDINGS Skull: Normal. Negative for fracture or focal lesion. Facial bones: No displaced fractures or dislocations. Sinuses/Orbits: No acute finding. Other: Patient is edentulous. CT CERVICAL SPINE FINDINGS Alignment: Postoperative straightening of the normal cervical lordosis. Skull base and vertebrae: No acute fracture. No primary bone lesion or focal pathologic process. Soft tissues and spinal canal: No prevertebral fluid or swelling. No visible canal hematoma. Disc levels: Status post posterior laminectomy and fusion of C3-C6. Mild-to-moderate multilevel cervical disc degenerative change, worst at C6-C7. Upper chest: Negative. Other: None. IMPRESSION: 1. No acute intracranial pathology. Advanced small-vessel white matter disease, in keeping with patient age. 2. No displaced fractures or dislocations of the facial bones. 3. No fracture or static  subluxation of the cervical spine. 4. Status post posterior laminectomy and fusion of C3-C6. Mild-to-moderate multilevel cervical disc degenerative change, worst at C6-C7. Electronically Signed   By: Fredricka Jenny M.D.   On: 01/28/2024 13:27    Pertinent labs & imaging results that were available during my care of the patient were reviewed by me and considered in my medical decision making (see MDM for details).  Medications Ordered in ED Medications  acetaminophen  (TYLENOL ) tablet 1,000 mg (1,000 mg Oral Given 01/28/24 1848)  allopurinol  (ZYLOPRIM ) tablet 300 mg (has no administration in time range)  aspirin  EC tablet 81 mg (has no administration in time range)  atorvastatin  (LIPITOR) tablet 40 mg (has no administration in time range)  lisinopril  (ZESTRIL ) tablet 20 mg (has no administration in time range)  mirtazapine  (REMERON ) tablet 7.5 mg (7.5 mg Oral Given 01/28/24 2123)  senna-docusate (Senokot-S) tablet 1-2 tablet (1 tablet Oral Given 01/28/24 1849)  tamsulosin  (FLOMAX ) capsule 0.4 mg (0.4 mg Oral Given 01/28/24 1848)  melatonin tablet 3 mg (3 mg Oral Given 01/28/24 2123)  primidone  (MYSOLINE ) tablet 25 mg (25 mg Oral Given 01/28/24 1848)  heparin  injection 5,000 Units (5,000 Units Subcutaneous Given 01/29/24 0609)  0.9 %  sodium chloride  infusion ( Intravenous New Bag/Given 01/28/24 1847)  cephALEXin  (KEFLEX ) capsule 250 mg (250 mg Oral Not Given 01/29/24 0609)  traMADol  (ULTRAM ) tablet 50 mg (50 mg Oral Given 01/29/24 0229)  Chlorhexidine  Gluconate Cloth 2 % PADS 6 each (has no administration in time range)  HYDROcodone -acetaminophen  (NORCO/VICODIN) 5-325 MG per tablet 1 tablet (1 tablet Oral Given 01/28/24 1319)  sodium chloride  0.9 % bolus 500 mL (0 mLs Intravenous Stopped 01/28/24 1507)  ketorolac (TORADOL) 15 MG/ML injection 15 mg (15 mg Intravenous Given 01/28/24 1621)  bacitracin  ointment (1 Application Topical Given 01/28/24 1706)  Procedures Procedures  (including critical care time)  Medical Decision Making / ED Course    Medical Decision Making:    SOLAN VOSLER is a 89 y.o. male with past medical history as below, significant for CAD, HLD, HTN, CKD, T2DM  who presents to the ED with complaint of fall. The complaint involves an extensive differential diagnosis and also carries with it a high risk of complications and morbidity.  Serious etiology was considered. Ddx includes but is not limited to: Differential diagnoses for head trauma includes subdural hematoma, epidural hematoma, acute concussion, traumatic subarachnoid hemorrhage, cerebral contusions, cardiac syncope, vasovagal, orthostatic etc.   Complete initial physical exam performed, notably the patient was in no acute distress.    Reviewed and confirmed nursing documentation for past medical history, family history, social history.  Vital signs reviewed.     Brief summary:  89 year old male history as above here with fall.  Patient reports he stood up, legs felt weak/gave out when he fell to the ground.  Unsure if he had LOC, does remember falling.  EMS reported that he was lying on the bathroom floor, once a set him up he became more alert.  Initially was unresponsive with snoring respirations per EMS.   Clinical Course as of 01/29/24 0750  Wed Jan 28, 2024  1348 Creatinine(!): 1.66 Similar to prior  [SG]    Clinical Course User Index [SG] Teddi Favors, DO     Upon further discussion with the patient and family he reports that he was feeling normal, he was walking to the restroom, he had no prodrome. Reports that he was walking and woke up on the floor. Unable to move his legs or get up. He was hypotensive on EMS arrival but this did improve. EKG does show RBBB with a 1st degree av block, pvc's. His bp is stable here. Labs/imaging were stable. Bradycardia noted, he  is on carvedilol , may need to dc. Concern for possible cardiac syncope, pt reports unable to walk at this time as well, was doing well up until his fall today. Will consult cardiology and hospitalist. Would recommend admission for syncope w/u and pt/ot eval, may require placement.              Additional history obtained: -Additional history obtained from ems -External records from outside source obtained and reviewed including: Chart review including previous notes, labs, imaging, consultation notes including  Primary care documentation Home medications> no blood thinners  Lab Tests: -I ordered, reviewed, and interpreted labs.   The pertinent results include:   Labs Reviewed  CBC WITH DIFFERENTIAL/PLATELET - Abnormal; Notable for the following components:      Result Value   RBC 3.46 (*)    Hemoglobin 12.1 (*)    HCT 35.2 (*)    MCV 101.7 (*)    MCH 35.0 (*)    All other components within normal limits  COMPREHENSIVE METABOLIC PANEL WITH GFR - Abnormal; Notable for the following components:   Sodium 134 (*)    Glucose, Bld 160 (*)    BUN 45 (*)    Creatinine, Ser 1.66 (*)    Total Protein 6.2 (*)    Albumin  3.0 (*)    GFR, Estimated 38 (*)    All other components within normal limits  BASIC METABOLIC PANEL WITH GFR - Abnormal; Notable for the following components:   CO2 21 (*)    BUN 54 (*)    Creatinine, Ser 1.63 (*)    GFR, Estimated  39 (*)    All other components within normal limits  CBC - Abnormal; Notable for the following components:   RBC 3.31 (*)    Hemoglobin 11.6 (*)    HCT 33.5 (*)    MCV 101.2 (*)    MCH 35.0 (*)    All other components within normal limits  CBG MONITORING, ED - Abnormal; Notable for the following components:   Glucose-Capillary 167 (*)    All other components within normal limits  CK  TSH  URINALYSIS, ROUTINE W REFLEX MICROSCOPIC  TROPONIN I (HIGH SENSITIVITY)  TROPONIN I (HIGH SENSITIVITY)    Notable for labs  stable  EKG   EKG Interpretation Date/Time:  Wednesday Jan 28 2024 11:49:17 EDT Ventricular Rate:  54 PR Interval:  240 QRS Duration:  149 QT Interval:  472 QTC Calculation: 448 R Axis:   -38  Text Interpretation: Sinus or ectopic atrial rhythm Multiform ventricular premature complexes Prolonged PR interval Right bundle branch block similar to prior Confirmed by Russella Courts ([id number]) on 01/28/2024 1:16:18 PM         Imaging Studies ordered: I ordered imaging studies including CTH CTM/F, CT c-spine, cxr/pelvis xr I independently visualized the following imaging with scope of interpretation limited to determining acute life threatening conditions related to emergency care; findings noted above I agree with the radiologist interpretation If any imaging was obtained with contrast I closely monitored patient for any possible adverse reaction a/w contrast administration in the emergency department   Medicines ordered and prescription drug management: Meds ordered this encounter  Medications   HYDROcodone -acetaminophen  (NORCO/VICODIN) 5-325 MG per tablet 1 tablet    Refill:  0   sodium chloride  0.9 % bolus 500 mL   ketorolac (TORADOL) 15 MG/ML injection 15 mg   bacitracin  ointment   acetaminophen  (TYLENOL ) tablet 1,000 mg   allopurinol  (ZYLOPRIM ) tablet 300 mg   aspirin  EC tablet 81 mg   atorvastatin  (LIPITOR) tablet 40 mg   lisinopril  (ZESTRIL ) tablet 20 mg   mirtazapine  (REMERON ) tablet 7.5 mg   senna-docusate (Senokot-S) tablet 1-2 tablet   tamsulosin  (FLOMAX ) capsule 0.4 mg   melatonin tablet 3 mg   primidone  (MYSOLINE ) tablet 25 mg   heparin  injection 5,000 Units   0.9 %  sodium chloride  infusion   cephALEXin  (KEFLEX ) capsule 250 mg   traMADol  (ULTRAM ) tablet 50 mg   Chlorhexidine  Gluconate Cloth 2 % PADS 6 each    -I have reviewed the patients home medicines and have made adjustments as needed   Consultations Obtained: I requested consultation with the hospitalist,   and discussed lab and imaging findings as well as pertinent plan    Cardiac Monitoring: The patient was maintained on a cardiac monitor.  I personally viewed and interpreted the cardiac monitored which showed an underlying rhythm of: sinus brady, pvc's Continuous pulse oximetry interpreted by myself, 99% on ra.    Social Determinants of Health:  Diagnosis or treatment significantly limited by social determinants of health: n/a   Reevaluation: After the interventions noted above, I reevaluated the patient and found that they have stayed the same  Co morbidities that complicate the patient evaluation  Past Medical History:  Diagnosis Date   Acute renal failure (HCC) 12/06/2013   Back pain    occasionally   Basal cell carcinoma (BCC) of antihelix of left ear    Benign essential tremor 2005   takes Primidone  daily   Choledocholithiasis 12/06/2013   Constipation    takes OTC stool softener  Coronary artery disease 2007   s/p CABG, DR Stann Earnest   GERD (gastroesophageal reflux disease)    takes Nexium  daily   Gout 1995   "~ once/yr" (03/17/2014)   History of colon polyps    Hyperlipidemia 12/2005   takes Atorvastatin  daily   Hypertension 1995   takes Lisinopril  and Coreg  daily   possible HCAP (healthcare-associated pneumonia) 12/09/2013   Prostate cancer (HCC) 2007   S/P treatment, followed by Urology prev   Right bundle branch block (RBBB)    stage 3b chronic kidney disease (HCC)    Stenosis of cervical spine    Type II diabetes mellitus (HCC)    no meds;diet and exercise controlled    Wears dentures    Wears glasses       Dispostion: Disposition decision including need for hospitalization was considered, and patient admitted to the hospital.    Final Clinical Impression(s) / ED Diagnoses Final diagnoses:  Syncope, unspecified syncope type  Weakness        Teddi Favors, DO 01/29/24 0750

## 2024-01-28 NOTE — ED Notes (Signed)
 Pt returned from CT

## 2024-01-28 NOTE — ED Triage Notes (Addendum)
 Pt BIB [facility] from home for c/o syncopal episode  When ems arrived pt was found lying on bathroom floor, ems reports when they assisted pt up and sat him down on the toilet pt became unresponsive with snoring respirations, once pt was on stretcher pt became more alert  Pt refused c-collar with ems  Pt states he was standing in the bathroom and he states his knees gave out and he fell to the floor  Pt BP with ems was 99/64, P 56, O2 95%  Pt has abrasion to forehead and bridge of nose and some redness to bilateral knees  Pt states he hurts all over

## 2024-01-28 NOTE — ED Notes (Signed)
 EDP made aware of pt with bigeminy

## 2024-01-28 NOTE — H&P (Signed)
 [facility] H&P   Patient Demographics:    [name] [name], is a 89 y.o. male  MRN: [id number]   DOB - 1929-08-20  Admit Date - 01/28/2024  Outpatient Primary MD for the patient is Donnie Galea, MD  Referring MD/NP/PA: Dr Martina Sledge  Patient coming from: home  Chief Complaint  Patient presents with   Loss of Consciousness      HPI:    [name] [name]  is a 89 y.o. male, with a history of chronic back pain, hypertension, hyperlipidemia, coronary disease, prostate cancer, CKD stage III, BPH, urinary retention, essential tremor . - Patient presents to ED secondary to episode of syncope, patient reports he tried to stand up, and then his legs gave out, he fell down, unclear if he had total loss of consciousness or not, report he hit his face on the ground, denies any chest pain, palpitation, reports recently has been feeling weak, lightheaded in general but nothing specific before this fall, reports he was unable to get up off the floor and called EMS, he is complaining of generalized body ache, pain, headache, any nausea, chest pain or shortness of breath. - in ED  he was noted to be in A-fib with RVR, started on Cardizem drip, converted to normal sinus rhythm, CT chest/abdomen/pelvis significant for T11 vertebral fracture, blood work significant for baseline creatinine of 1.6 KX, sodium of 134, no leukocytosis, troponins negative x 2, [facility] hospitalist consulted to admit.     Review of systems:     A full 10 point Review of Systems was done, except as stated above, all other Review of Systems were negative.   With Past History of the following :    Past Medical History:  Diagnosis Date   Acute renal failure (HCC) 12/06/2013   Back pain    occasionally   Basal cell carcinoma (BCC) of antihelix of left ear    Benign essential tremor 2005   takes Primidone  daily    Choledocholithiasis 12/06/2013   Constipation    takes OTC stool softener   Coronary artery disease 2007   s/p CABG, DR Stann Earnest   GERD (gastroesophageal reflux disease)    takes Nexium  daily   Gout 1995   "~ once/yr" (03/17/2014)   History of colon polyps    Hyperlipidemia 12/2005   takes Atorvastatin  daily   Hypertension 1995   takes Lisinopril  and Coreg  daily   possible HCAP (healthcare-associated pneumonia) 12/09/2013   Prostate cancer (HCC) 2007   S/P treatment, followed by Urology prev   Right bundle branch block (RBBB)    stage 3b chronic kidney disease (HCC)    Stenosis of cervical spine    Type II diabetes mellitus (HCC)    no meds;diet and exercise controlled    Wears dentures    Wears glasses       Past Surgical History:  Procedure Laterality Date   Adenosine  myoview   01/24/2006   Ischemia by EKG, Cath   CATARACT EXTRACTION W/ INTRAOCULAR LENS IMPLANT Left 10/2013   CHOLECYSTECTOMY N/A 12/06/2013   Procedure: Diagnostic Laparoscopy for  drainage Intrabdominal abcess;  Surgeon: Enid Harry, MD;  Location: [facility] OR;  Service: General;  Laterality: N/A;   CHOLECYSTECTOMY N/A 03/17/2014   Procedure: LAPAROSCOPIC CHOLECYSTECTOMY ;  Surgeon: Enid Harry, MD;  Location: [facility] OR;  Service: General;  Laterality: N/A;   COLONOSCOPY     CORONARY ARTERY BYPASS GRAFT  2007   CABGX 4   CYSTOSCOPY  02/15/2004   Mod BPH, moder tribec ?   ERCP N/A 12/06/2013   Procedure: ENDOSCOPIC RETROGRADE CHOLANGIOPANCREATOGRAPHY (ERCP);  Surgeon: Janel Medford, MD;  Location: [facility] OR;  Service: Endoscopy;  Laterality: N/A;   LAPAROSCOPIC CHOLECYSTECTOMY  03/17/2014   LUMBAR FUSION  02/2012   lumbar spine for spinal stenosis   POSTERIOR CERVICAL FUSION/FORAMINOTOMY N/A 05/01/2020   Procedure: Posterior cervical fusion with lateral mass fixation - Cervical three - Cervical six, laminectomy Cervical three-six;  Surgeon: Isadora Mar, MD;  Location: [facility] OR;  Service: Neurosurgery;  Laterality:  N/A;   PROSTATE CRYOABLATION  09/18/2005   prostate CA      Social History:     Social History   Tobacco Use   Smoking status: Never   Smokeless tobacco: Never  Substance Use Topics   Alcohol use: No      Family History :     Family History  Problem Relation Age of Onset   Heart disease Mother    Cancer Mother    Cancer Sister    Diabetes Sister    Cancer Brother        prostate   Prostate cancer Brother    Hypothyroidism Brother    Depression Neg Hx    Alcohol abuse Neg Hx    Drug abuse Neg Hx    Stroke Neg Hx    Colon cancer Neg Hx     Home Medications:   Prior to Admission medications   Medication Sig Start Date End Date Taking? Authorizing Provider  acetaminophen  (TYLENOL ) 500 MG tablet Take 2 tablets (1,000 mg total) by mouth 3 (three) times daily as needed for mild pain or moderate pain. 06/06/20  Yes Donnie Galea, MD  allopurinol  (ZYLOPRIM ) 300 MG tablet Take 1 tablet (300 mg total) by mouth daily. 05/14/23  Yes Donnie Galea, MD  aspirin  81 MG tablet Take 81 mg by mouth daily.   Yes [provider]  atorvastatin  (LIPITOR) 40 MG tablet TAKE 1 TABLET DAILY 05/07/23  Yes Donnie Galea, MD  carvedilol  (COREG  CR) 20 MG 24 hr capsule Take 1 capsule (20 mg total) by mouth daily. 05/14/23  Yes Donnie Galea, MD  lisinopril  (ZESTRIL ) 20 MG tablet Take 1 tablet (20 mg total) by mouth daily. 09/08/23  Yes Donnie Galea, MD  melatonin 3 MG TABS tablet Take 1 tablet (3 mg total) by mouth at bedtime. 05/17/20  Yes Love, Renay Carota, PA-C  mirtazapine  (REMERON ) 7.5 MG tablet TAKE 1 TABLET(7.5 MG) BY MOUTH AT BEDTIME. STOP TRAZODONE  WITH MIRTAZAPINE  USE 12/09/23  Yes Donnie Galea, MD  omeprazole  (PRILOSEC) 40 MG capsule TAKE 1 CAPSULE DAILY AS NEEDED Patient taking differently: Take 40 mg by mouth daily as needed (heartburn, acid reflux, indigestion). 04/04/23  Yes Donnie Galea, MD  primidone  (MYSOLINE ) 50 MG tablet TAKE ONE-HALF (1/2) TABLET DAILY  05/07/23  Yes Donnie Galea, MD  senna-docusate (SENOKOT-S)  8.6-50 MG tablet Take 1-2 tablets by mouth daily. 06/22/21  Yes Donnie Galea, MD  silodosin  (RAPAFLO ) 8 MG CAPS capsule TAKE 1 CAPSULE AT BEDTIME 11/06/23  Yes McKenzie, Arden Beck, MD     Allergies:    No Known Allergies   Physical Exam:   Vitals  Blood pressure 110/83, pulse 61, temperature 97.6 F (36.4 C), temperature source Oral, resp. rate 13, height 5\' 11"  (1.803 m), weight 77.1 kg, SpO2 98%.   1. General Well, elderly male, pleasant, no apparent distress, with few facial bruising from fall  2. Normal affect and insight, Not Suicidal or Homicidal, Awake Alert, Oriented X 3.  3. No F.N deficits, ALL C.Nerves Intact, Strength 5/5 all 4 extremities, Sensation intact all 4 extremities, Plantars down going.  4. Ears and Eyes appear Normal, Conjunctivae clear, PERRLA. Moist Oral Mucosa.  5. Supple Neck, No JVD, No cervical lymphadenopathy appriciated, No Carotid Bruits.  6. Symmetrical Chest wall movement, Good air movement bilaterally, CTAB.  7. RRR, No Gallops, Rubs or Murmurs, No Parasternal Heave.  8. Positive Bowel Sounds, Abdomen Soft, No tenderness, No organomegaly appriciated,No rebound -guarding or rigidity.  9.  No Cyanosis, Normal Skin Turgor, No Skin Rash or Bruise.  10. Good muscle tone,  joints appear normal , no effusions, Normal ROM.     Data Review:    CBC Recent Labs  Lab 01/28/24 1227  WBC 6.2  HGB 12.1*  HCT 35.2*  PLT 172  MCV 101.7*  MCH 35.0*  MCHC 34.4  RDW 14.4  LYMPHSABS 1.4  MONOABS 0.5  EOSABS 0.3  BASOSABS 0.0   ------------------------------------------------------------------------------------------------------------------  Chemistries  Recent Labs  Lab 01/28/24 1227  NA 134*  K 4.9  CL 107  CO2 22  GLUCOSE 160*  BUN 45*  CREATININE 1.66*  CALCIUM  9.0  AST 19  ALT 17  ALKPHOS 52  BILITOT 1.0    ------------------------------------------------------------------------------------------------------------------ estimated creatinine clearance is 29.6 mL/min (A) (by C-G formula based on SCr of 1.66 mg/dL (H)). ------------------------------------------------------------------------------------------------------------------ No results for input(s): "TSH", "T4TOTAL", "T3FREE", "THYROIDAB" in the last 72 hours.  Invalid input(s): "FREET3"  Coagulation profile No results for input(s): "INR", "PROTIME" in the last 168 hours. ------------------------------------------------------------------------------------------------------------------- No results for input(s): "DDIMER" in the last 72 hours. -------------------------------------------------------------------------------------------------------------------  Cardiac Enzymes No results for input(s): "CKMB", "TROPONINI", "MYOGLOBIN" in the last 168 hours.  Invalid input(s): "CK" ------------------------------------------------------------------------------------------------------------------ No results found for: "BNP"   ---------------------------------------------------------------------------------------------------------------  Urinalysis    Component Value Date/Time   COLORURINE YELLOW 12/27/2022 1539   APPEARANCEUR CLEAR 12/27/2022 1539   APPEARANCEUR Cloudy (A) 12/24/2022 1209   LABSPEC 1.009 12/27/2022 1539   PHURINE 6.0 12/27/2022 1539   GLUCOSEU NEGATIVE 12/27/2022 1539   HGBUR NEGATIVE 12/27/2022 1539   BILIRUBINUR NEGATIVE 12/27/2022 1539   BILIRUBINUR Negative 12/24/2022 1209   KETONESUR NEGATIVE 12/27/2022 1539   PROTEINUR 100 (A) 12/27/2022 1539   UROBILINOGEN 0.2 10/10/2021 1453   UROBILINOGEN 0.2 01/07/2014 1044   NITRITE NEGATIVE 12/27/2022 1539   LEUKOCYTESUR SMALL (A) 12/27/2022 1539    ----------------------------------------------------------------------------------------------------------------    Imaging Results:    DG Pelvis Portable Result Date: 01/28/2024 CLINICAL DATA:  Fall. EXAM: PORTABLE PELVIS 1-2 VIEWS COMPARISON:  None Available. FINDINGS: There is no evidence of pelvic fracture or diastasis. No pelvic bone lesions are seen. IMPRESSION: Negative. Electronically Signed   By: Rosalene Colon M.D.   On: 01/28/2024 14:46   DG Chest Portable 1 View Result Date: 01/28/2024 CLINICAL DATA:  Fall. EXAM: PORTABLE CHEST 1  VIEW COMPARISON:  October 24, 2023. FINDINGS: Stable cardiomegaly. Status post coronary artery bypass graft. No acute pulmonary disease. Bony thorax is unremarkable. IMPRESSION: No active disease. Electronically Signed   By: Rosalene Colon M.D.   On: 01/28/2024 14:45   CT Head Wo Contrast Result Date: 01/28/2024 CLINICAL DATA:  Syncope, fall, head injury EXAM: CT HEAD WITHOUT CONTRAST CT MAXILLOFACIAL WITHOUT CONTRAST CT CERVICAL SPINE WITHOUT CONTRAST TECHNIQUE: Multidetector CT imaging of the head, cervical spine, and maxillofacial structures were performed using the standard protocol without intravenous contrast. Multiplanar CT image reconstructions of the cervical spine and maxillofacial structures were also generated. RADIATION DOSE REDUCTION: This exam was performed according to the departmental dose-optimization program which includes automated exposure control, adjustment of the mA and/or kV according to patient size and/or use of iterative reconstruction technique. COMPARISON:  08/13/2022 FINDINGS: CT HEAD FINDINGS Brain: No evidence of acute infarction, hemorrhage, hydrocephalus, extra-axial collection or mass lesion/mass effect. Extensive periventricular and deep white matter hypodensity. Vascular: No hyperdense vessel or unexpected calcification. CT FACIAL BONES FINDINGS Skull: Normal. Negative for fracture or focal lesion. Facial bones: No displaced fractures or dislocations. Sinuses/Orbits: No acute finding. Other: Patient is edentulous. CT CERVICAL SPINE FINDINGS  Alignment: Postoperative straightening of the normal cervical lordosis. Skull base and vertebrae: No acute fracture. No primary bone lesion or focal pathologic process. Soft tissues and spinal canal: No prevertebral fluid or swelling. No visible canal hematoma. Disc levels: Status post posterior laminectomy and fusion of C3-C6. Mild-to-moderate multilevel cervical disc degenerative change, worst at C6-C7. Upper chest: Negative. Other: None. IMPRESSION: 1. No acute intracranial pathology. Advanced small-vessel white matter disease, in keeping with patient age. 2. No displaced fractures or dislocations of the facial bones. 3. No fracture or static subluxation of the cervical spine. 4. Status post posterior laminectomy and fusion of C3-C6. Mild-to-moderate multilevel cervical disc degenerative change, worst at C6-C7. Electronically Signed   By: Fredricka Jenny M.D.   On: 01/28/2024 13:27   CT Maxillofacial Wo Contrast Result Date: 01/28/2024 CLINICAL DATA:  Syncope, fall, head injury EXAM: CT HEAD WITHOUT CONTRAST CT MAXILLOFACIAL WITHOUT CONTRAST CT CERVICAL SPINE WITHOUT CONTRAST TECHNIQUE: Multidetector CT imaging of the head, cervical spine, and maxillofacial structures were performed using the standard protocol without intravenous contrast. Multiplanar CT image reconstructions of the cervical spine and maxillofacial structures were also generated. RADIATION DOSE REDUCTION: This exam was performed according to the departmental dose-optimization program which includes automated exposure control, adjustment of the mA and/or kV according to patient size and/or use of iterative reconstruction technique. COMPARISON:  08/13/2022 FINDINGS: CT HEAD FINDINGS Brain: No evidence of acute infarction, hemorrhage, hydrocephalus, extra-axial collection or mass lesion/mass effect. Extensive periventricular and deep white matter hypodensity. Vascular: No hyperdense vessel or unexpected calcification. CT FACIAL BONES FINDINGS  Skull: Normal. Negative for fracture or focal lesion. Facial bones: No displaced fractures or dislocations. Sinuses/Orbits: No acute finding. Other: Patient is edentulous. CT CERVICAL SPINE FINDINGS Alignment: Postoperative straightening of the normal cervical lordosis. Skull base and vertebrae: No acute fracture. No primary bone lesion or focal pathologic process. Soft tissues and spinal canal: No prevertebral fluid or swelling. No visible canal hematoma. Disc levels: Status post posterior laminectomy and fusion of C3-C6. Mild-to-moderate multilevel cervical disc degenerative change, worst at C6-C7. Upper chest: Negative. Other: None. IMPRESSION: 1. No acute intracranial pathology. Advanced small-vessel white matter disease, in keeping with patient age. 2. No displaced fractures or dislocations of the facial bones. 3. No fracture or static subluxation of the cervical spine.  4. Status post posterior laminectomy and fusion of C3-C6. Mild-to-moderate multilevel cervical disc degenerative change, worst at C6-C7. Electronically Signed   By: Fredricka Jenny M.D.   On: 01/28/2024 13:27   CT Cervical Spine Wo Contrast Result Date: 01/28/2024 CLINICAL DATA:  Syncope, fall, head injury EXAM: CT HEAD WITHOUT CONTRAST CT MAXILLOFACIAL WITHOUT CONTRAST CT CERVICAL SPINE WITHOUT CONTRAST TECHNIQUE: Multidetector CT imaging of the head, cervical spine, and maxillofacial structures were performed using the standard protocol without intravenous contrast. Multiplanar CT image reconstructions of the cervical spine and maxillofacial structures were also generated. RADIATION DOSE REDUCTION: This exam was performed according to the departmental dose-optimization program which includes automated exposure control, adjustment of the mA and/or kV according to patient size and/or use of iterative reconstruction technique. COMPARISON:  08/13/2022 FINDINGS: CT HEAD FINDINGS Brain: No evidence of acute infarction, hemorrhage, hydrocephalus,  extra-axial collection or mass lesion/mass effect. Extensive periventricular and deep white matter hypodensity. Vascular: No hyperdense vessel or unexpected calcification. CT FACIAL BONES FINDINGS Skull: Normal. Negative for fracture or focal lesion. Facial bones: No displaced fractures or dislocations. Sinuses/Orbits: No acute finding. Other: Patient is edentulous. CT CERVICAL SPINE FINDINGS Alignment: Postoperative straightening of the normal cervical lordosis. Skull base and vertebrae: No acute fracture. No primary bone lesion or focal pathologic process. Soft tissues and spinal canal: No prevertebral fluid or swelling. No visible canal hematoma. Disc levels: Status post posterior laminectomy and fusion of C3-C6. Mild-to-moderate multilevel cervical disc degenerative change, worst at C6-C7. Upper chest: Negative. Other: None. IMPRESSION: 1. No acute intracranial pathology. Advanced small-vessel white matter disease, in keeping with patient age. 2. No displaced fractures or dislocations of the facial bones. 3. No fracture or static subluxation of the cervical spine. 4. Status post posterior laminectomy and fusion of C3-C6. Mild-to-moderate multilevel cervical disc degenerative change, worst at C6-C7. Electronically Signed   By: Fredricka Jenny M.D.   On: 01/28/2024 13:27     EKG:  Vent. rate 54 BPM PR interval 240 ms QRS duration 149 ms QT/QTcB 472/448 ms P-R-T axes 235 -38 32 Sinus or ectopic atrial rhythm Multiform ventricular premature complexes Prolonged PR interval Right bundle branch block  Assessment & Plan:    Principal Problem:   Syncope Active Problems:   Essential hypertension   CAD (coronary artery disease)   CKD (chronic kidney disease), stage III (HCC)   Abnormality of gait   DNR (do not resuscitate)   BPH (benign prostatic hyperplasia)  Syncope versus presyncope Generalized weakness/deconditioning -Presents with fall, with concern for presyncope versus syncope, not totally  clear about loss of consciousness -Will admit to telemetry -Will check 2D echo. -Will check UA to rule out UTI as he is having low-grade temperature -Will check orthostasis (was too weak and unsteady to stand up for orthostasis while in ED) -Continue gentle hydration -he was noted to have mild bradycardia while in ED, I will hold his home Coreg  and check TSH - Consult PT, OT - CT head with no acute finding  Bradycardia - Heart rate noted to be 40s to 50s at rest, with right bundle branch block, will hold Coreg  for now, check TSH and monitoring telemetry, will obtain 2D echo    chronic renal failure--CKD stage IIIb -Baseline creatinine 1.4-1.6 - Nephrotoxic medications, continue with gentle hydration  Hyponatremia - I will continue with IV fluids    Essential hypertension - Overall blood pressure soft to acceptable, will hold Coreg  due to bradycardia, continue with lisinopril    Hyperlipidemia -Continue statin  Facial  bruising/wounds from fall - Will keep on p.o. for Keflex   Essential tremors -continue with home primidone   DVT Prophylaxis Heparin   AM Labs Ordered, also please review Full Orders  Family Communication: Admission, patients condition and plan of care including tests being ordered have been discussed with the patient and grandson at bedside who indicate understanding and agree with the plan and Code Status.  Code Status DNR, confirmed by patient  Likely DC to home  Consults called: None  Admission status: Observation  Time spent in minutes : 70 minutes   Seena Dadds M.D on 01/28/2024 at 5:52 PM   [facility] - Office  [phone]

## 2024-01-29 ENCOUNTER — Observation Stay (HOSPITAL_COMMUNITY)

## 2024-01-29 DIAGNOSIS — W1830XA Fall on same level, unspecified, initial encounter: Secondary | ICD-10-CM | POA: Diagnosis present

## 2024-01-29 DIAGNOSIS — I951 Orthostatic hypotension: Secondary | ICD-10-CM | POA: Diagnosis present

## 2024-01-29 DIAGNOSIS — R1311 Dysphagia, oral phase: Secondary | ICD-10-CM | POA: Diagnosis present

## 2024-01-29 DIAGNOSIS — I4891 Unspecified atrial fibrillation: Secondary | ICD-10-CM | POA: Diagnosis present

## 2024-01-29 DIAGNOSIS — S0993XD Unspecified injury of face, subsequent encounter: Secondary | ICD-10-CM | POA: Diagnosis not present

## 2024-01-29 DIAGNOSIS — I129 Hypertensive chronic kidney disease with stage 1 through stage 4 chronic kidney disease, or unspecified chronic kidney disease: Secondary | ICD-10-CM | POA: Diagnosis present

## 2024-01-29 DIAGNOSIS — I493 Ventricular premature depolarization: Secondary | ICD-10-CM | POA: Diagnosis present

## 2024-01-29 DIAGNOSIS — E441 Mild protein-calorie malnutrition: Secondary | ICD-10-CM | POA: Diagnosis present

## 2024-01-29 DIAGNOSIS — R339 Retention of urine, unspecified: Secondary | ICD-10-CM | POA: Diagnosis present

## 2024-01-29 DIAGNOSIS — R55 Syncope and collapse: Secondary | ICD-10-CM | POA: Diagnosis present

## 2024-01-29 DIAGNOSIS — N179 Acute kidney failure, unspecified: Secondary | ICD-10-CM | POA: Diagnosis present

## 2024-01-29 DIAGNOSIS — K5900 Constipation, unspecified: Secondary | ICD-10-CM | POA: Diagnosis present

## 2024-01-29 DIAGNOSIS — M549 Dorsalgia, unspecified: Secondary | ICD-10-CM | POA: Diagnosis present

## 2024-01-29 DIAGNOSIS — N400 Enlarged prostate without lower urinary tract symptoms: Secondary | ICD-10-CM | POA: Diagnosis present

## 2024-01-29 DIAGNOSIS — S22089A Unspecified fracture of T11-T12 vertebra, initial encounter for closed fracture: Secondary | ICD-10-CM | POA: Diagnosis present

## 2024-01-29 DIAGNOSIS — I4510 Unspecified right bundle-branch block: Secondary | ICD-10-CM | POA: Diagnosis present

## 2024-01-29 DIAGNOSIS — R262 Difficulty in walking, not elsewhere classified: Secondary | ICD-10-CM | POA: Diagnosis present

## 2024-01-29 DIAGNOSIS — D84821 Immunodeficiency due to drugs: Secondary | ICD-10-CM | POA: Diagnosis present

## 2024-01-29 DIAGNOSIS — N1832 Chronic kidney disease, stage 3b: Secondary | ICD-10-CM | POA: Diagnosis present

## 2024-01-29 DIAGNOSIS — S0083XA Contusion of other part of head, initial encounter: Secondary | ICD-10-CM | POA: Diagnosis present

## 2024-01-29 DIAGNOSIS — R338 Other retention of urine: Secondary | ICD-10-CM | POA: Diagnosis present

## 2024-01-29 DIAGNOSIS — G8929 Other chronic pain: Secondary | ICD-10-CM | POA: Diagnosis present

## 2024-01-29 DIAGNOSIS — I10 Essential (primary) hypertension: Secondary | ICD-10-CM | POA: Diagnosis present

## 2024-01-29 DIAGNOSIS — M6281 Muscle weakness (generalized): Secondary | ICD-10-CM | POA: Diagnosis present

## 2024-01-29 DIAGNOSIS — R001 Bradycardia, unspecified: Secondary | ICD-10-CM | POA: Diagnosis present

## 2024-01-29 DIAGNOSIS — G992 Myelopathy in diseases classified elsewhere: Secondary | ICD-10-CM | POA: Diagnosis present

## 2024-01-29 DIAGNOSIS — M79601 Pain in right arm: Secondary | ICD-10-CM | POA: Diagnosis present

## 2024-01-29 DIAGNOSIS — R5383 Other fatigue: Secondary | ICD-10-CM | POA: Diagnosis present

## 2024-01-29 DIAGNOSIS — Z8546 Personal history of malignant neoplasm of prostate: Secondary | ICD-10-CM | POA: Diagnosis not present

## 2024-01-29 DIAGNOSIS — M4802 Spinal stenosis, cervical region: Secondary | ICD-10-CM | POA: Diagnosis present

## 2024-01-29 DIAGNOSIS — G250 Essential tremor: Secondary | ICD-10-CM | POA: Diagnosis present

## 2024-01-29 DIAGNOSIS — M6259 Muscle wasting and atrophy, not elsewhere classified, multiple sites: Secondary | ICD-10-CM | POA: Diagnosis present

## 2024-01-29 DIAGNOSIS — R251 Tremor, unspecified: Secondary | ICD-10-CM | POA: Diagnosis present

## 2024-01-29 DIAGNOSIS — M79602 Pain in left arm: Secondary | ICD-10-CM | POA: Diagnosis present

## 2024-01-29 DIAGNOSIS — K219 Gastro-esophageal reflux disease without esophagitis: Secondary | ICD-10-CM | POA: Diagnosis present

## 2024-01-29 DIAGNOSIS — Z66 Do not resuscitate: Secondary | ICD-10-CM | POA: Diagnosis present

## 2024-01-29 DIAGNOSIS — Y92091 Bathroom in other non-institutional residence as the place of occurrence of the external cause: Secondary | ICD-10-CM | POA: Diagnosis not present

## 2024-01-29 DIAGNOSIS — Y9301 Activity, walking, marching and hiking: Secondary | ICD-10-CM | POA: Diagnosis present

## 2024-01-29 DIAGNOSIS — I2510 Atherosclerotic heart disease of native coronary artery without angina pectoris: Secondary | ICD-10-CM | POA: Diagnosis present

## 2024-01-29 DIAGNOSIS — S0081XA Abrasion of other part of head, initial encounter: Secondary | ICD-10-CM | POA: Diagnosis present

## 2024-01-29 DIAGNOSIS — N401 Enlarged prostate with lower urinary tract symptoms: Secondary | ICD-10-CM | POA: Diagnosis present

## 2024-01-29 DIAGNOSIS — E871 Hypo-osmolality and hyponatremia: Secondary | ICD-10-CM | POA: Diagnosis present

## 2024-01-29 DIAGNOSIS — M109 Gout, unspecified: Secondary | ICD-10-CM | POA: Diagnosis present

## 2024-01-29 DIAGNOSIS — E1122 Type 2 diabetes mellitus with diabetic chronic kidney disease: Secondary | ICD-10-CM | POA: Diagnosis present

## 2024-01-29 DIAGNOSIS — E782 Mixed hyperlipidemia: Secondary | ICD-10-CM | POA: Diagnosis present

## 2024-01-29 LAB — BASIC METABOLIC PANEL WITH GFR
Anion gap: 9 (ref 5–15)
BUN: 54 mg/dL — ABNORMAL HIGH (ref 8–23)
CO2: 21 mmol/L — ABNORMAL LOW (ref 22–32)
Calcium: 9.2 mg/dL (ref 8.9–10.3)
Chloride: 106 mmol/L (ref 98–111)
Creatinine, Ser: 1.63 mg/dL — ABNORMAL HIGH (ref 0.61–1.24)
GFR, Estimated: 39 mL/min — ABNORMAL LOW (ref >60)
Glucose, Bld: 92 mg/dL (ref 70–99)
Potassium: 4.2 mmol/L (ref 3.5–5.1)
Sodium: 136 mmol/L (ref 135–145)

## 2024-01-29 LAB — ECHOCARDIOGRAM COMPLETE
AR max vel: 1.61 cm2
AV Area VTI: 1.79 cm2
AV Area mean vel: 1.65 cm2
AV Mean grad: 4 mmHg
AV Peak grad: 9.4 mmHg
Ao pk vel: 1.53 m/s
Area-P 1/2: 2.67 cm2
Calc EF: 67.8 %
Height: 71 in
S' Lateral: 3.8 cm
Single Plane A2C EF: 69.8 %
Single Plane A4C EF: 65.2 %
Weight: 2913.6 [oz_av]

## 2024-01-29 LAB — CBC
HCT: 33.5 % — ABNORMAL LOW (ref 39.0–52.0)
Hemoglobin: 11.6 g/dL — ABNORMAL LOW (ref 13.0–17.0)
MCH: 35 pg — ABNORMAL HIGH (ref 26.0–34.0)
MCHC: 34.6 g/dL (ref 30.0–36.0)
MCV: 101.2 fL — ABNORMAL HIGH (ref 80.0–100.0)
Platelets: 159 10*3/uL (ref 150–400)
RBC: 3.31 MIL/uL — ABNORMAL LOW (ref 4.22–5.81)
RDW: 14.5 % (ref 11.5–15.5)
WBC: 7.4 10*3/uL (ref 4.0–10.5)
nRBC: 0 % (ref 0.0–0.2)

## 2024-01-29 MED ORDER — SALINE SPRAY 0.65 % NA SOLN
1.0000 | NASAL | Status: DC | PRN
  Filled 2024-01-29: qty 44

## 2024-01-29 MED ORDER — ACETAMINOPHEN 500 MG PO TABS: 1000.0000 mg | ORAL_TABLET | Freq: Three times a day (TID) | ORAL | Status: DC | PRN

## 2024-01-29 MED ORDER — TRAMADOL HCL 50 MG PO TABS
50.0000 mg | ORAL_TABLET | Freq: Four times a day (QID) | ORAL | Status: DC | PRN
  Administered 2024-01-29 – 2024-02-02 (×8): 50 mg via ORAL
  Filled 2024-01-29 (×9): qty 1

## 2024-01-29 MED ORDER — CHLORHEXIDINE GLUCONATE CLOTH 2 % EX PADS
6.0000 | MEDICATED_PAD | Freq: Every day | CUTANEOUS | Status: DC
  Administered 2024-01-29 – 2024-02-03 (×6): 6 via TOPICAL

## 2024-01-29 MED ORDER — HYDROCODONE-ACETAMINOPHEN 5-325 MG PO TABS
1.0000 | ORAL_TABLET | Freq: Four times a day (QID) | ORAL | Status: DC | PRN
  Administered 2024-01-29 – 2024-02-03 (×12): 1 via ORAL
  Filled 2024-01-29 (×12): qty 1

## 2024-01-29 NOTE — Progress Notes (Signed)
 PROGRESS NOTE  [name]:[id number] DOB: [birth year] DOA: 01/28/2024 PCP: Donnie Galea, MD  HPI/Recap of past 24 hours: [name] [name]  is a 89 y.o. male, with a history of chronic back pain, HTN, HLD, CAD, prostate cancer, CKD stage III, BPH, urinary retention, essential tremor, presents to ED secondary to episode of syncope, patient reports he tried to stand up, and then his legs gave out, he fell down, unclear if he had total loss of consciousness or not, report he hit his face on the ground. Recently has been feeling weak, lightheaded in general but nothing specific before this fall, reports he was unable to get up off the floor and called EMS, he is complaining of generalized body ache. In ED, CT head unremarkable, blood work significant for baseline creatinine of 1.6, sodium of 134, no leukocytosis, troponins negative x 2, CK WNL. [facility] hospital[facility] consulted to admit.   Today patient reports having bilateral upper extremity soreness, no obvious wounds or bruising noted.  Able to still move upper extremities, with good range of motion.  Patient denies any chest pain, shortness of breath, abdominal pain, nausea/vomiting, fever/chills.   Assessment/Plan: Principal Problem:   Syncope Active Problems:   Essential hypertension   CAD (coronary artery disease)   CKD (chronic kidney disease), stage III (HCC)   Abnormality of gait   DNR (do not resuscitate)   BPH (benign prostatic hyperplasia)   Syncope versus presyncope Generalized weakness/deconditioning ?Orthostatic hypotension UA pending collection CXR unremarkable EKG now with SR, noted prolonged PR (noted prior), PVCs ECHO pending CT head with no acute finding Check orthostasis Continue gentle hydration Consult PT, OT Telemetry   Bradycardia Noted prior Heart rate noted to be 40s to 50s at rest, with right bundle branch block Hold Coreg  for now Monitor on Tele  Acute Urinary retention Noted 821 mls on  bladder scan Foley catheter placed  CKD stage IIIb Baseline creatinine 1.4-1.6 Nephrotoxic medications Continue with gentle hydration   Mild hyponatremia Gentle IVF   Essential hypertension BP now stable Hold Coreg  due to bradycardia, continue with lisinopril    Hyperlipidemia Continue statin   Facial bruising/wounds from fall Continue p.o. Keflex    Essential tremors Continue with home primidone     Estimated body mass index is 25.4 kg/m as calculated from the following:   Height as of this encounter: 5\' 11"  (1.803 m).   Weight as of this encounter: 82.6 kg.     Code Status: DNR  Family Communication: None at bedside  Disposition Plan: Status is: Observation The patient will require care spanning > 2 midnights and should be moved to inpatient because: Level of care      Consultants: None  Procedures: None  Antimicrobials: Keflex   DVT prophylaxis: Heparin  [place]   Objective: Vitals:   01/28/24 1759 01/28/24 2204 01/29/24 0204 01/29/24 0555  BP: 131/79 112/65 138/75 137/70  Pulse:  66 (!) 57 (!) 56  Resp: 14 20 18 16   Temp: 97.6 F (36.4 C) 98.1 F (36.7 C) 97.8 F (36.6 C) 97.8 F (36.6 C)  TempSrc: Oral Oral Oral Oral  SpO2: 99% 97% 97% 96%  Weight:      Height:        Intake/Output Summary (Last 24 hours) at 01/29/2024 0658 Last data filed at 01/29/2024 0649 Gross per 24 hour  Intake 1650.5 ml  Output 1200 ml  Net 450.5 ml   Filed Weights   01/28/24 1200 01/28/24 1750  Weight: 77.1 kg 82.6 kg  Exam: General: NAD, noted facial bruising, dried blood clots noted in nasal cavity Cardiovascular: S1, S2 present Respiratory: CTAB Abdomen: Soft, nontender, nondistended, bowel sounds present Musculoskeletal: No bilateral pedal edema noted Skin: Normal Psychiatry: Normal mood     Data Reviewed: CBC: Recent Labs  Lab 01/28/24 1227 01/29/24 0401  WBC 6.2 7.4  NEUTROABS 4.0  --   HGB 12.1* 11.6*  HCT 35.2* 33.5*  MCV 101.7* 101.2*   PLT 172 159   Basic Metabolic Panel: Recent Labs  Lab 01/28/24 1227  NA 134*  K 4.9  CL 107  CO2 22  GLUCOSE 160*  BUN 45*  CREATININE 1.66*  CALCIUM  9.0   GFR: Estimated Creatinine Clearance: 29.6 mL/min (A) (by C-G formula based on SCr of 1.66 mg/dL (H)). Liver Function Tests: Recent Labs  Lab 01/28/24 1227  AST 19  ALT 17  ALKPHOS 52  BILITOT 1.0  PROT 6.2*  ALBUMIN  3.0*   No results for input(s): "LIPASE", "AMYLASE" in the last 168 hours. No results for input(s): "AMMONIA" in the last 168 hours. Coagulation Profile: No results for input(s): "INR", "PROTIME" in the last 168 hours. Cardiac Enzymes: Recent Labs  Lab 01/28/24 1227  CKTOTAL 117   BNP (last 3 results) No results for input(s): "PROBNP" in the last 8760 hours. HbA1C: No results for input(s): "HGBA1C" in the last 72 hours. CBG: Recent Labs  Lab 01/28/24 1150  GLUCAP 167*   Lipid Profile: No results for input(s): "CHOL", "HDL", "LDLCALC", "TRIG", "CHOLHDL", "LDLDIRECT" in the last 72 hours. Thyroid  Function Tests: Recent Labs    01/28/24 1449  TSH 3.660   Anemia Panel: No results for input(s): "VITAMINB12", "FOLATE", "FERRITIN", "TIBC", "IRON", "RETICCTPCT" in the last 72 hours. Urine analysis:    Component Value Date/Time   COLORURINE YELLOW 12/27/2022 1539   APPEARANCEUR CLEAR 12/27/2022 1539   APPEARANCEUR Cloudy (A) 12/24/2022 1209   LABSPEC 1.009 12/27/2022 1539   PHURINE 6.0 12/27/2022 1539   GLUCOSEU NEGATIVE 12/27/2022 1539   HGBUR NEGATIVE 12/27/2022 1539   BILIRUBINUR NEGATIVE 12/27/2022 1539   BILIRUBINUR Negative 12/24/2022 1209   KETONESUR NEGATIVE 12/27/2022 1539   PROTEINUR 100 (A) 12/27/2022 1539   UROBILINOGEN 0.2 10/10/2021 1453   UROBILINOGEN 0.2 01/07/2014 1044   NITRITE NEGATIVE 12/27/2022 1539   LEUKOCYTESUR SMALL (A) 12/27/2022 1539   Sepsis Labs: @LABRCNTIP (procalcitonin:4,lacticidven:4)  )No results found for this or any previous visit (from the past  240 hours).    Studies: DG Pelvis Portable Result Date: 01/28/2024 CLINICAL DATA:  Fall. EXAM: PORTABLE PELVIS 1-2 VIEWS COMPARISON:  None Available. FINDINGS: There is no evidence of pelvic fracture or diastasis. No pelvic bone lesions are seen. IMPRESSION: Negative. Electronically Signed   By: Rosalene Colon M.D.   On: 01/28/2024 14:46   DG Chest Portable 1 View Result Date: 01/28/2024 CLINICAL DATA:  Fall. EXAM: PORTABLE CHEST 1 VIEW COMPARISON:  October 24, 2023. FINDINGS: Stable cardiomegaly. Status post coronary artery bypass graft. No acute pulmonary disease. Bony thorax is unremarkable. IMPRESSION: No active disease. Electronically Signed   By: Rosalene Colon M.D.   On: 01/28/2024 14:45   CT Head Wo Contrast Result Date: 01/28/2024 CLINICAL DATA:  Syncope, fall, head injury EXAM: CT HEAD WITHOUT CONTRAST CT MAXILLOFACIAL WITHOUT CONTRAST CT CERVICAL SPINE WITHOUT CONTRAST TECHNIQUE: Multidetector CT imaging of the head, cervical spine, and maxillofacial structures were performed using the standard protocol without intravenous contrast. Multiplanar CT image reconstructions of the cervical spine and maxillofacial structures were also generated. RADIATION DOSE  REDUCTION: This exam was performed according to the departmental dose-optimization program which includes automated exposure control, adjustment of the mA and/or kV according to patient size and/or use of iterative reconstruction technique. COMPARISON:  08/13/2022 FINDINGS: CT HEAD FINDINGS Brain: No evidence of acute infarction, hemorrhage, hydrocephalus, extra-axial collection or mass lesion/mass effect. Extensive periventricular and deep white matter hypodensity. Vascular: No hyperdense vessel or unexpected calcification. CT FACIAL BONES FINDINGS Skull: Normal. Negative for fracture or focal lesion. Facial bones: No displaced fractures or dislocations. Sinuses/Orbits: No acute finding. Other: Patient is edentulous. CT CERVICAL SPINE  FINDINGS Alignment: Postoperative straightening of the normal cervical lordosis. Skull base and vertebrae: No acute fracture. No primary bone lesion or focal pathologic process. Soft tissues and spinal canal: No prevertebral fluid or swelling. No visible canal hematoma. Disc levels: Status post posterior laminectomy and fusion of C3-C6. Mild-to-moderate multilevel cervical disc degenerative change, worst at C6-C7. Upper chest: Negative. Other: None. IMPRESSION: 1. No acute intracranial pathology. Advanced small-vessel white matter disease, in keeping with patient age. 2. No displaced fractures or dislocations of the facial bones. 3. No fracture or static subluxation of the cervical spine. 4. Status post posterior laminectomy and fusion of C3-C6. Mild-to-moderate multilevel cervical disc degenerative change, worst at C6-C7. Electronically Signed   By: Fredricka Jenny M.D.   On: 01/28/2024 13:27   CT Maxillofacial Wo Contrast Result Date: 01/28/2024 CLINICAL DATA:  Syncope, fall, head injury EXAM: CT HEAD WITHOUT CONTRAST CT MAXILLOFACIAL WITHOUT CONTRAST CT CERVICAL SPINE WITHOUT CONTRAST TECHNIQUE: Multidetector CT imaging of the head, cervical spine, and maxillofacial structures were performed using the standard protocol without intravenous contrast. Multiplanar CT image reconstructions of the cervical spine and maxillofacial structures were also generated. RADIATION DOSE REDUCTION: This exam was performed according to the departmental dose-optimization program which includes automated exposure control, adjustment of the mA and/or kV according to patient size and/or use of iterative reconstruction technique. COMPARISON:  08/13/2022 FINDINGS: CT HEAD FINDINGS Brain: No evidence of acute infarction, hemorrhage, hydrocephalus, extra-axial collection or mass lesion/mass effect. Extensive periventricular and deep white matter hypodensity. Vascular: No hyperdense vessel or unexpected calcification. CT FACIAL BONES  FINDINGS Skull: Normal. Negative for fracture or focal lesion. Facial bones: No displaced fractures or dislocations. Sinuses/Orbits: No acute finding. Other: Patient is edentulous. CT CERVICAL SPINE FINDINGS Alignment: Postoperative straightening of the normal cervical lordosis. Skull base and vertebrae: No acute fracture. No primary bone lesion or focal pathologic process. Soft tissues and spinal canal: No prevertebral fluid or swelling. No visible canal hematoma. Disc levels: Status post posterior laminectomy and fusion of C3-C6. Mild-to-moderate multilevel cervical disc degenerative change, worst at C6-C7. Upper chest: Negative. Other: None. IMPRESSION: 1. No acute intracranial pathology. Advanced small-vessel white matter disease, in keeping with patient age. 2. No displaced fractures or dislocations of the facial bones. 3. No fracture or static subluxation of the cervical spine. 4. Status post posterior laminectomy and fusion of C3-C6. Mild-to-moderate multilevel cervical disc degenerative change, worst at C6-C7. Electronically Signed   By: Fredricka Jenny M.D.   On: 01/28/2024 13:27   CT Cervical Spine Wo Contrast Result Date: 01/28/2024 CLINICAL DATA:  Syncope, fall, head injury EXAM: CT HEAD WITHOUT CONTRAST CT MAXILLOFACIAL WITHOUT CONTRAST CT CERVICAL SPINE WITHOUT CONTRAST TECHNIQUE: Multidetector CT imaging of the head, cervical spine, and maxillofacial structures were performed using the standard protocol without intravenous contrast. Multiplanar CT image reconstructions of the cervical spine and maxillofacial structures were also generated. RADIATION DOSE REDUCTION: This exam was performed according to the departmental  dose-optimization program which includes automated exposure control, adjustment of the mA and/or kV according to patient size and/or use of iterative reconstruction technique. COMPARISON:  08/13/2022 FINDINGS: CT HEAD FINDINGS Brain: No evidence of acute infarction, hemorrhage,  hydrocephalus, extra-axial collection or mass lesion/mass effect. Extensive periventricular and deep white matter hypodensity. Vascular: No hyperdense vessel or unexpected calcification. CT FACIAL BONES FINDINGS Skull: Normal. Negative for fracture or focal lesion. Facial bones: No displaced fractures or dislocations. Sinuses/Orbits: No acute finding. Other: Patient is edentulous. CT CERVICAL SPINE FINDINGS Alignment: Postoperative straightening of the normal cervical lordosis. Skull base and vertebrae: No acute fracture. No primary bone lesion or focal pathologic process. Soft tissues and spinal canal: No prevertebral fluid or swelling. No visible canal hematoma. Disc levels: Status post posterior laminectomy and fusion of C3-C6. Mild-to-moderate multilevel cervical disc degenerative change, worst at C6-C7. Upper chest: Negative. Other: None. IMPRESSION: 1. No acute intracranial pathology. Advanced small-vessel white matter disease, in keeping with patient age. 2. No displaced fractures or dislocations of the facial bones. 3. No fracture or static subluxation of the cervical spine. 4. Status post posterior laminectomy and fusion of C3-C6. Mild-to-moderate multilevel cervical disc degenerative change, worst at C6-C7. Electronically Signed   By: Fredricka Jenny M.D.   On: 01/28/2024 13:27    Scheduled Meds:  allopurinol   300 mg Oral Daily   aspirin  EC  81 mg Oral Daily   atorvastatin   40 mg Oral Daily   cephALEXin   250 mg Oral Q8H   Chlorhexidine  Gluconate Cloth  6 each Topical Daily   heparin   5,000 Units Subcutaneous Q8H   lisinopril   20 mg Oral Daily   melatonin  3 mg Oral QHS   mirtazapine   7.5 mg Oral QHS   primidone   25 mg Oral Daily   senna-docusate  1-2 tablet Oral Daily   tamsulosin   0.4 mg Oral QPC supper    Continuous Infusions:  sodium chloride  50 mL/hr at 01/28/24 1847     LOS: 0 days     Veronica Gordon, MD [facility]  If 7PM-7AM, please contact  night-coverage [email or web address] 01/29/2024, 6:58 AM

## 2024-01-29 NOTE — Progress Notes (Signed)
 Mobility Specialist Progress Note:    01/29/24 1315  Mobility  Activity Stood at bedside;Transferred from chair to bed  Level of Assistance Moderate assist, patient does 50-74%  Assistive Device Front wheel walker  Distance Ambulated (ft) 2 ft  Range of Motion/Exercises Active;All extremities  Activity Response Tolerated well  Mobility visit 1 Mobility  Mobility Specialist Start Time (ACUTE ONLY) 1300  Mobility Specialist Stop Time (ACUTE ONLY) 1315  Mobility Specialist Time Calculation (min) (ACUTE ONLY) 15 min   Pt received in chair, staff requesting assistance to transfer. Required ModA to stand and transfer with RW. Tolerated well, asx throughout. Left pt supine, alarm on. All needs met.   Glinda Lapping Mobility Specialist Please contact via Special educational needs teacher or  Rehab office at [phone]

## 2024-01-29 NOTE — Plan of Care (Signed)
  Problem: Education: Goal: Knowledge of General Education information will improve Description: Including pain rating scale, medication(s)/side effects and non-pharmacologic comfort measures Outcome: Progressing   Problem: Pain Managment: Goal: General experience of comfort will improve and/or be controlled Outcome: Progressing   Problem: Skin Integrity: Goal: Risk for impaired skin integrity will decrease Outcome: Progressing

## 2024-01-29 NOTE — Evaluation (Signed)
 Occupational Therapy Evaluation Patient Details Name: [name] [name] MRN: [id number] DOB: 1929-07-02 Today's Date: 01/29/2024   History of Present Illness   [name] [name]  is a 89 y.o. male, with a history of chronic back pain, hypertension, hyperlipidemia, coronary disease, prostate cancer, CKD stage III, BPH, urinary retention, essential tremor .  - Patient presents to ED secondary to episode of syncope, patient reports he tried to stand up, and then his legs gave out, he fell down, unclear if he had total loss of consciousness or not, report he hit his face on the ground, denies any chest pain, palpitation, reports recently has been feeling weak, lightheaded in general but nothing specific before this fall, reports he was unable to get up off the floor and called EMS, he is complaining of generalized body ache, pain, headache, any nausea, chest pain or shortness of breath.  - in ED  he was noted to be in A-fib with RVR, started on Cardizem drip, converted to normal sinus rhythm, CT chest/abdomen/pelvis significant for T11 vertebral fracture, blood work significant for baseline creatinine of 1.6 KX, sodium of 134, no leukocytosis, troponins negative x 2, [facility] hospitalist consulted to admit. (per MD)     Clinical Impressions Pt agreeable to OT and PT co-evaluation. Pt lives with daughter who is available PRN. Pt required min to mod A for bed mobility and mod A for transfers with RW. Unsteady in standing with progressive weakness noted during ambulation. B UE presents with new weakness and significant decrease in fine motor coordination. Mild gross motor deficit noted to L UE. Assist needed for ADL's based on weakness and instability. Pt left in the chair with call bell within reach and chair alarm set. Pt will benefit from continued OT in the hospital and recommended venue below to increase strength, balance, and endurance for safe ADL's.        If plan is discharge home, recommend the  following:   A lot of help with walking and/or transfers;A lot of help with bathing/dressing/bathroom;Assistance with cooking/housework;Assist for transportation;Help with stairs or ramp for entrance     Functional Status Assessment   Patient has had a recent decline in their functional status and demonstrates the ability to make significant improvements in function in a reasonable and predictable amount of time.     Equipment Recommendations   None recommended by OT             Precautions/Restrictions   Precautions Precautions: Fall Recall of Precautions/Restrictions: Intact Restrictions Weight Bearing Restrictions Per Provider Order: No     Mobility Bed Mobility Overal bed mobility: Needs Assistance Bed Mobility: Supine to Sit     Supine to sit: Min assist, Mod assist, HOB elevated     General bed mobility comments: labored movement; poor turnk control and B UE strength to come to sit.    Transfers Overall transfer level: Needs assistance Equipment used: Rolling walker (2 wheels) Transfers: Sit to/from Stand, Bed to chair/wheelchair/BSC Sit to Stand: Min assist, Mod assist     Step pivot transfers: Mod assist     General transfer comment: unsteady with use of RW; labored movement      Balance Overall balance assessment: Needs assistance Sitting-balance support: No upper extremity supported, Feet supported Sitting balance-Leahy Scale: Fair Sitting balance - Comments: seated at EOB   Standing balance support: Bilateral upper extremity supported, During functional activity, Reliant on assistive device for balance Standing balance-Leahy Scale: Poor Standing balance comment: poor to fair with RW  ADL either performed or assessed with clinical judgement   ADL Overall ADL's : Needs assistance/impaired Eating/Feeding: Set up;Sitting   Grooming: Set up;Sitting   Upper Body Bathing: Set up;Sitting   Lower Body  Bathing: Maximal assistance;Total assistance;Sitting/lateral leans   Upper Body Dressing : Set up;Sitting;Minimal assistance   Lower Body Dressing: Maximal assistance;Sitting/lateral leans Lower Body Dressing Details (indicate cue type and reason): Lacked dexterity to don socks at EOB. Toilet Transfer: Moderate assistance;Stand-pivot;Ambulation;Rolling walker (2 wheels) Toilet Transfer Details (indicate cue type and reason): Simulated via EOB to chair and ambulation in the hall with RW. Toileting- Clothing Manipulation and Hygiene: Moderate assistance;Maximal assistance;Sitting/lateral lean       Functional mobility during ADLs: Moderate assistance;Rolling walker (2 wheels) General ADL Comments: Noted to become progressively weaker in B LE while ambulating a short distance just outside the room with RW.     Vision Baseline Vision/History: 1 Wears glasses Ability to See in Adequate Light: 1 Impaired Patient Visual Report: No change from baseline Vision Assessment?: Yes Eye Alignment: Within Functional Limits Tracking/Visual Pursuits: Other (comment) (Noted difficulty in upper R quadrant. Eyes observed to drift back to midline after a short time.) Visual Fields: No apparent deficits     Perception Perception: Not tested       Praxis Praxis: Not tested       Pertinent Vitals/Pain Pain Assessment Pain Assessment: Faces Faces Pain Scale: Hurts a little bit Pain Location: upper neck, arms, hands Pain Descriptors / Indicators: Numbness Pain Intervention(s): Monitored during session, Repositioned     Extremity/Trunk Assessment Upper Extremity Assessment Upper Extremity Assessment: RUE deficits/detail;LUE deficits/detail RUE Deficits / Details: 3-/5 shoulder flexion; 4+/5 elbow flexion; 4+/5 elbow extension, wrist flexion and extension 3+/5, composit grasp 3+/5. Poor fine motor skills. RUE Sensation: WNL RUE Coordination: decreased fine motor LUE Deficits / Details: 3-/5  shoulder flexion; 4-/5 elbow flexion; 3+/5 elbow extension, wrist flexion and extension 3+/5, composit grasp 3+/5. Poor fine motor skills. Mild gross motor deficit as well. LUE Sensation: WNL (Pt did report numbness in hands.) LUE Coordination: decreased fine motor;decreased gross motor   Lower Extremity Assessment Lower Extremity Assessment: Defer to PT evaluation   Cervical / Trunk Assessment Cervical / Trunk Assessment: Kyphotic   Communication Communication Communication: No apparent difficulties   Cognition Arousal: Alert Behavior During Therapy: WFL for tasks assessed/performed Cognition: No apparent impairments                               Following commands: Intact       Cueing  General Comments   Cueing Techniques: Verbal cues;Tactile cues                 Home Living Family/patient expects to be discharged to:: Private residence Living Arrangements: Children Available Help at Discharge: Family;Available PRN/intermittently (daughter) Type of Home: House Home Access: Stairs to enter [employer] of Steps: 2 Entrance Stairs-Rails: None Home Layout: One level     Bathroom Shower/Tub: Producer, television/film/video: Standard Bathroom Accessibility: No   Home Equipment: Agricultural consultant (2 wheels);Cane - quad;Grab bars - toilet;Shower seat   Additional Comments: Pt reports living in the same location as previous history.      Prior Functioning/Environment Prior Level of Function : Needs assist       Physical Assist : ADLs (physical)   ADLs (physical): IADLs;Bathing Mobility Comments: [place] ambulation with RW ADLs Comments: Assist for bathing and IADL's.  OT Problem List: Decreased strength;Decreased range of motion;Decreased activity tolerance;Impaired balance (sitting and/or standing);Decreased coordination;Impaired sensation;Impaired UE functional use   OT Treatment/Interventions: Self-care/ADL training;Therapeutic  exercise;Neuromuscular education;Energy conservation;DME and/or AE instruction;Therapeutic activities;Patient/family education;Visual/perceptual remediation/compensation;Balance training      OT Goals(Current goals can be found in the care plan section)   Acute Rehab OT Goals Patient Stated Goal: improve function OT Goal Formulation: With patient Time For Goal Achievement: 02/12/24 Potential to Achieve Goals: Good   OT Frequency:  Min 2X/week    Co-evaluation PT/OT/SLP Co-Evaluation/Treatment: Yes Reason for Co-Treatment: To address functional/ADL transfers   OT goals addressed during session: ADL's and self-care      AM-PAC OT "6 Clicks" Daily Activity     Outcome Measure Help from another person eating meals?: A Little Help from another person taking care of personal grooming?: A Little Help from another person toileting, which includes using toliet, bedpan, or urinal?: A Lot Help from another person bathing (including washing, rinsing, drying)?: A Lot Help from another person to put on and taking off regular upper body clothing?: A Little Help from another person to put on and taking off regular lower body clothing?: A Lot 6 Click Score: 15   End of Session Equipment Utilized During Treatment: Rolling walker (2 wheels);Gait belt  Activity Tolerance: Patient tolerated treatment well Patient left: in chair;with call bell/phone within reach;with chair alarm set  OT Visit Diagnosis: Unsteadiness on feet (R26.81);Other abnormalities of gait and mobility (R26.89);Muscle weakness (generalized) (M62.81);History of falling (Z91.81);Other symptoms and signs involving the nervous system (G25.427)                Time: 0623-7628 OT Time Calculation (min): 22 min Charges:  OT General Charges $OT Visit: 1 Visit OT Evaluation $OT Eval Low Complexity: 1 Low  Keiyon Plack OT, MOT   Thurnell Floss 01/29/2024, 12:09 PM

## 2024-01-29 NOTE — Plan of Care (Signed)
  Problem: Acute Rehab OT Goals (only OT should resolve) Goal: Pt. Will Perform Eating Flowsheets (Taken 01/29/2024 1211) Pt Will Perform Eating: Independently Goal: Pt. Will Perform Grooming Flowsheets (Taken 01/29/2024 1211) Pt Will Perform Grooming: Independently Goal: Pt. Will Perform Upper Body Dressing Flowsheets (Taken 01/29/2024 1211) Pt Will Perform Upper Body Dressing: Independently Goal: Pt. Will Perform Lower Body Dressing Flowsheets (Taken 01/29/2024 1211) Pt Will Perform Lower Body Dressing: with modified independence Goal: Pt. Will Transfer To Toilet Flowsheets (Taken 01/29/2024 1211) Pt Will Transfer to Toilet:  with modified independence  squat pivot transfer Goal: Pt. Will Perform Toileting-Clothing Manipulation Flowsheets (Taken 01/29/2024 1211) Pt Will Perform Toileting - Clothing Manipulation and hygiene:  with modified independence  sitting/lateral leans Goal: Pt/Caregiver Will Perform Home Exercise Program Flowsheets (Taken 01/29/2024 1211) Pt/caregiver will Perform Home Exercise Program:  Increased strength  Increased ROM  Both right and left upper extremity  Independently  Queenie Aufiero OT, MOT

## 2024-01-29 NOTE — Progress Notes (Addendum)
 Mobility Specialist Progress Note:    01/29/24 1416  Orthostatic Lying   BP- Lying 125/67  Pulse- Lying 50  Orthostatic Sitting  BP- Sitting 106/65  Pulse- Sitting 54  Orthostatic Standing at 0 minutes  BP- Standing at 0 minutes 102/56  Pulse- Standing at 0 minutes 63  Orthostatic Standing at 3 minutes  BP- Standing at 3 minutes 96/52  Pulse- Standing at 3 minutes 58  Mobility  Activity Stood at bedside;Dangled on edge of bed  Level of Assistance Moderate assist, patient does 50-74%  Assistive Device Front wheel walker  Range of Motion/Exercises Active;All extremities  Activity Response Tolerated well  Mobility visit 1 Mobility  Mobility Specialist Start Time (ACUTE ONLY) 1412  Mobility Specialist Stop Time (ACUTE ONLY) 1434  Mobility Specialist Time Calculation (min) (ACUTE ONLY) 22 min   Pt received in bed, daughter in room. Agreeable to session. Required ModA to stand EOB with RW. Tolerated well, pt denies dizziness, lightheadedness, and any other symptoms. Only c/o pain from recent fall. Returned supine, alarm on. All needs met.  Glinda Lapping Mobility Specialist Please contact via Special educational needs teacher or  Rehab office at [phone]

## 2024-01-29 NOTE — NC FL2 (Addendum)
 [place]  MEDICAID FL2 LEVEL OF CARE FORM     IDENTIFICATION  Patient Name: [name] [name] Birthdate: 01-26-1929 Sex: male Admission Date (Current Location): 01/28/2024  [facility] and [place] Number:  [employer] and Address:  [facility],  [address] [place] [address]      Provider Number: [id number]  Attending Physician Name and Address:  Veronica Gordon, MD  Relative Name and Phone Number:       Current Level of Care: Hospital Recommended Level of Care: Skilled Nursing Facility Prior Approval Number:    Date Approved/Denied:   PASRR Number:  [id number]  Discharge Plan: SNF    Current Diagnoses: Patient Active Problem List   Diagnosis Date Noted   Syncope 01/28/2024   Cough 09/09/2023   Other fatigue 06/11/2023   Dysuria 01/08/2023   BPH (benign prostatic hyperplasia) 12/28/2022   Failure to thrive in adult 12/28/2022   Skin lesion 08/21/2022   History of prostate cancer 10/10/2021   Acute on chronic urinary retention 10/10/2021   Cystitis 10/10/2021   DNR (do not resuscitate) 06/25/2021   Constipation 07/10/2020   Sleep disturbance 07/10/2020   Abnormality of gait 07/10/2020   Insomnia 06/12/2020   Acute renal failure superimposed on stage 3b chronic kidney disease (HCC)    Hyponatremia    Hypoalbuminemia due to protein-calorie malnutrition (HCC)    Transaminitis    Acute on chronic anemia    Stenosis of cervical spine with myelopathy (HCC) 05/04/2020   S/P cervical spinal fusion 05/01/2020   History of agent Orange exposure 04/05/2020   Paresthesia 01/20/2020   Health care maintenance 12/28/2017   GERD (gastroesophageal reflux disease) 12/28/2017   Medicare annual wellness visit, subsequent 10/30/2014   Advance care planning 10/30/2014   UTI (urinary tract infection) 04/19/2014   Right bundle branch block (RBBB)    Wheezing 12/09/2013   CKD (chronic kidney disease), stage III (HCC) 12/06/2013   Back pain  10/20/2011   Hypothyroidism 03/23/2009   Vitamin D deficiency 03/23/2009   Tremor 12/18/2006   CAD (coronary artery disease) 12/18/2006   Mixed hyperlipidemia 12/08/2005   History of diabetes mellitus 02/07/2005   Gout 09/09/1993   Essential hypertension 09/09/1993    Orientation RESPIRATION BLADDER Height & Weight     Self, Time, Situation, Place  Normal Continent Weight: 182 lb 1.6 oz (82.6 kg) Height:  5\' 11"  (180.3 cm)  BEHAVIORAL SYMPTOMS/MOOD NEUROLOGICAL BOWEL NUTRITION STATUS      Continent Diet (Heart healthy)  AMBULATORY STATUS COMMUNICATION OF NEEDS Skin   Extensive Assist Verbally Normal                       Personal Care Assistance Level of Assistance  Bathing, Feeding, Dressing Bathing Assistance: Limited assistance Feeding assistance: Independent Dressing Assistance: Limited assistance     Functional Limitations Info  Sight, Hearing, Speech Sight Info: Impaired Hearing Info: Adequate Speech Info: Adequate    SPECIAL CARE FACTORS FREQUENCY  PT (By licensed PT), OT (By licensed OT)     PT Frequency: 5 times weekly OT Frequency: 5 times weekly            Contractures Contractures Info: Not present    Additional Factors Info  Code Status, Allergies Code Status Info: DNR-Limited Allergies Info: NKA           Current Medications (01/29/2024):  This is the current hospital active medication list Current Facility-Administered Medications  Medication Dose Route Frequency Provider Last Rate  Last Admin   0.9 %  sodium chloride  infusion   Intravenous Continuous Elgergawy, Dawood S, MD 50 mL/hr at 01/28/24 1847 New Bag at 01/28/24 1847   acetaminophen  (TYLENOL ) tablet 1,000 mg  1,000 mg Oral TID PRN Ezenduka, Nkeiruka J, MD       allopurinol  (ZYLOPRIM ) tablet 300 mg  300 mg Oral Daily Elgergawy, Dawood S, MD   300 mg at 01/29/24 1610   aspirin  EC tablet 81 mg  81 mg Oral Daily Elgergawy, Dawood S, MD   81 mg at 01/29/24 9604   atorvastatin   (LIPITOR) tablet 40 mg  40 mg Oral Daily Elgergawy, Dawood S, MD   40 mg at 01/29/24 0820   cephALEXin  (KEFLEX ) capsule 250 mg  250 mg Oral Q8H Elgergawy, Dawood S, MD   250 mg at 01/28/24 2124   Chlorhexidine  Gluconate Cloth 2 % PADS 6 each  6 each Topical Daily Arrien, Mauricio Daniel, MD       heparin  injection 5,000 Units  5,000 Units Subcutaneous Q8H Elgergawy, Dawood S, MD   5,000 Units at 01/29/24 5409   HYDROcodone -acetaminophen  (NORCO/VICODIN) 5-325 MG per tablet 1 tablet  1 tablet Oral Q6H PRN Ezenduka, Nkeiruka J, MD       lisinopril  (ZESTRIL ) tablet 20 mg  20 mg Oral Daily Elgergawy, Dawood S, MD   20 mg at 01/29/24 0820   melatonin tablet 3 mg  3 mg Oral QHS Elgergawy, Dawood S, MD   3 mg at 01/28/24 2123   mirtazapine  (REMERON ) tablet 7.5 mg  7.5 mg Oral QHS Elgergawy, Dawood S, MD   7.5 mg at 01/28/24 2123   primidone  (MYSOLINE ) tablet 25 mg  25 mg Oral Daily Elgergawy, Dawood S, MD   25 mg at 01/29/24 0820   senna-docusate (Senokot-S) tablet 1-2 tablet  1-2 tablet Oral Daily Elgergawy, Dawood S, MD   1 tablet at 01/29/24 8119   sodium chloride  (OCEAN) 0.65 % nasal spray 1 spray  1 spray Each Nare PRN Ezenduka, Nkeiruka J, MD       tamsulosin  (FLOMAX ) capsule 0.4 mg  0.4 mg Oral QPC supper Elgergawy, Dawood S, MD   0.4 mg at 01/28/24 1478   traMADol  (ULTRAM ) tablet 50 mg  50 mg Oral Q6H PRN Arrien, Mauricio Daniel, MD   50 mg at 01/29/24 0830     Discharge Medications: Please see discharge summary for a list of discharge medications.  Relevant Imaging Results:  Relevant Lab Results:   Additional Information SSN: [id number] LCSWA

## 2024-01-29 NOTE — Progress Notes (Signed)
*  PRELIMINARY RESULTS* Echocardiogram 2D Echocardiogram has been performed.  [name] [name] 01/29/2024, 1:45 PM

## 2024-01-29 NOTE — Progress Notes (Signed)
 Patient c/o numbness in his hands and feet. On call MD Arrien made aware.

## 2024-01-29 NOTE — TOC Initial Note (Signed)
 Transition of Care [facility]) - Initial/Assessment Note    Patient Details  Name: [name] [name] MRN: [id number] Date of Birth: 1929-04-25  [facility] of [facility]) CM/SW Contact:    Grandville Lax, LCSWA Phone Number: 01/29/2024, 11:55 AM  Clinical Narrative:                 CSW updated that PT is recommending SNF for pt. CSW met with pt at bedside to complete assessment. Pt lives with his daughter. Pt states he is mostly independent. Pt states that he has an aide that comes out twice weekly to assist with bathing. Pt has a cane and walker to use in the home. CSW spoke with pt about SNF placement, he is agreeable and prefers placement in the county if possible. CSW to complete referral and send out for review. [facility] notified of pts hospital admission. [facility] notification is [phone]. TOC to follow.   Expected Discharge Plan: Skilled Nursing Facility Barriers to Discharge: Continued Medical Work up   Patient Goals and CMS Choice Patient states their goals for this hospitalization and ongoing recovery are:: return home CMS Medicare.gov Compare Post Acute Care list provided to:: Patient Choice offered to / list presented to : Patient      Expected Discharge Plan and Services In-house Referral: Clinical Social Work Discharge Planning Services: CM Consult Post Acute Care Choice: Skilled Nursing Facility Living arrangements for the past 2 months: Single Family Home                                      Prior Living Arrangements/Services Living arrangements for the past 2 months: Single Family Home Lives with:: Adult Children Patient language and need for interpreter reviewed:: Yes Do you feel safe going back to the place where you live?: Yes      Need for Family Participation in Patient Care: Yes (Comment) Care giver support system in place?: Yes (comment) Current home services: DME Criminal Activity/Legal Involvement Pertinent to Current Situation/Hospitalization: No  - Comment as needed  Activities of Daily Living   ADL Screening (condition at time of admission) Independently performs ADLs?: Yes (appropriate for developmental age) Is the patient deaf or have difficulty hearing?: No Does the patient have difficulty seeing, even when wearing glasses/contacts?: No Does the patient have difficulty concentrating, remembering, or making decisions?: No  Permission Sought/Granted                  Emotional Assessment Appearance:: Appears stated age Attitude/Demeanor/Rapport: Engaged Affect (typically observed): Accepting Orientation: : Oriented to Self, Oriented to Place, Oriented to  Time, Oriented to Situation Alcohol / Substance Use: Not Applicable Psych Involvement: No (comment)  Admission diagnosis:  Syncope [R55] Weakness [R53.1] Syncope, unspecified syncope type [R55] Patient Active Problem List   Diagnosis Date Noted   Syncope 01/28/2024   Cough 09/09/2023   Other fatigue 06/11/2023   Dysuria 01/08/2023   BPH (benign prostatic hyperplasia) 12/28/2022   Failure to thrive in adult 12/28/2022   Skin lesion 08/21/2022   History of prostate cancer 10/10/2021   Acute on chronic urinary retention 10/10/2021   Cystitis 10/10/2021   DNR (do not resuscitate) 06/25/2021   Constipation 07/10/2020   Sleep disturbance 07/10/2020   Abnormality of gait 07/10/2020   Insomnia 06/12/2020   Acute renal failure superimposed on stage 3b chronic kidney disease (HCC)    Hyponatremia    Hypoalbuminemia due to protein-calorie  malnutrition (HCC)    Transaminitis    Acute on chronic anemia    Stenosis of cervical spine with myelopathy (HCC) 05/04/2020   S/P cervical spinal fusion 05/01/2020   History of agent Orange exposure 04/05/2020   Paresthesia 01/20/2020   Health care maintenance 12/28/2017   GERD (gastroesophageal reflux disease) 12/28/2017   Medicare annual wellness visit, subsequent 10/30/2014   Advance care planning 10/30/2014   UTI  (urinary tract infection) 04/19/2014   Right bundle branch block (RBBB)    Wheezing 12/09/2013   CKD (chronic kidney disease), stage III (HCC) 12/06/2013   Back pain 10/20/2011   Hypothyroidism 03/23/2009   Vitamin D deficiency 03/23/2009   Tremor 12/18/2006   CAD (coronary artery disease) 12/18/2006   Mixed hyperlipidemia 12/08/2005   History of diabetes mellitus 02/07/2005   Gout 09/09/1993   Essential hypertension 09/09/1993   PCP:  Donnie Galea, MD Pharmacy:   [facility] [phone] - [place], [place] - [address] AT [address] OF [address] & [address] [place] [address] Phone: [phone] Fax: [phone]  [facility] - [place], [place] - [address] [place] [address] Phone: [phone] Fax: [phone]     Social Drivers of Health (SDOH) Social History: SDOH Screenings   Food Insecurity: No Food Insecurity (01/28/2024)  Housing: Low Risk  (01/28/2024)  Transportation Needs: No Transportation Needs (01/28/2024)  Utilities: Not At Risk (01/28/2024)  Depression (PHQ2-9): High Risk (06/10/2023)  Social Connections: Unknown (01/28/2024)  Tobacco Use: Low Risk  (01/28/2024)   SDOH Interventions:     Readmission Risk Interventions     No data to display

## 2024-01-29 NOTE — Progress Notes (Signed)
 Urinary retention 821 ml, placed foley catheter

## 2024-01-29 NOTE — Plan of Care (Signed)
  Problem: Acute Rehab PT Goals(only PT should resolve) Goal: Pt Will Go Supine/Side To Sit Outcome: Progressing Flowsheets (Taken 01/29/2024 1411) Pt will go Supine/Side to Sit: with minimal assist Goal: Patient Will Transfer Sit To/From Stand Outcome: Progressing Flowsheets (Taken 01/29/2024 1411) Patient will transfer sit to/from stand: with minimal assist Goal: Pt Will Transfer Bed To Chair/Chair To Bed Outcome: Progressing Flowsheets (Taken 01/29/2024 1411) Pt will Transfer Bed to Chair/Chair to Bed: with min assist Goal: Pt Will Ambulate Outcome: Progressing Flowsheets (Taken 01/29/2024 1411) Pt will Ambulate:  50 feet  with minimal assist  with rolling walker   2:11 PM, 01/29/24 Walton Guppy, MPT Physical Therapist with [facility] [phone] office [phone] mobile phone

## 2024-01-29 NOTE — Evaluation (Signed)
 Physical Therapy Evaluation Patient Details Name: [name] [name] MRN: [id number] DOB: 1929/05/18 Today's Date: 01/29/2024  History of Present Illness  [name] [name]  is a 89 y.o. male, with a history of chronic back pain, hypertension, hyperlipidemia, coronary disease, prostate cancer, CKD stage III, BPH, urinary retention, essential tremor .  - Patient presents to ED secondary to episode of syncope, patient reports he tried to stand up, and then his legs gave out, he fell down, unclear if he had total loss of consciousness or not, report he hit his face on the ground, denies any chest pain, palpitation, reports recently has been feeling weak, lightheaded in general but nothing specific before this fall, reports he was unable to get up off the floor and called EMS, he is complaining of generalized body ache, pain, headache, any nausea, chest pain or shortness of breath.  - in ED  he was noted to be in A-fib with RVR, started on Cardizem drip, converted to normal sinus rhythm, CT chest/abdomen/pelvis significant for T11 vertebral fracture, blood work significant for baseline creatinine of 1.6 KX, sodium of 134, no leukocytosis, troponins negative x 2, [facility] hospitalist consulted to admit.   Clinical Impression  Patient demonstrates slow labored movement for sitting up at bedside, once seated c/o numbness and limited use of hands, very unsteady on feet due to BLE weakness, able to ambulate out side of room, but limited to attempting hallways due to weakness, fatigue and fall risk. Patient tolerated sitting up in chair after therapy. Patient will benefit from continued skilled physical therapy in hospital and recommended venue below to increase strength, balance, endurance for safe ADLs and gait.           If plan is discharge home, recommend the following: A lot of help with bathing/dressing/bathroom;A lot of help with walking and/or transfers;Help with stairs or ramp for entrance;Assistance with  cooking/housework   Can travel by private vehicle   No    Equipment Recommendations None recommended by PT  Recommendations for Other Services       Functional Status Assessment Patient has had a recent decline in their functional status and demonstrates the ability to make significant improvements in function in a reasonable and predictable amount of time.     Precautions / Restrictions Precautions Precautions: Fall Recall of Precautions/Restrictions: Intact Restrictions Weight Bearing Restrictions Per Provider Order: No      Mobility  Bed Mobility Overal bed mobility: Needs Assistance Bed Mobility: Supine to Sit     Supine to sit: Min assist, Mod assist, HOB elevated     General bed mobility comments: slow labored movement    Transfers Overall transfer level: Needs assistance Equipment used: Rolling walker (2 wheels) Transfers: Sit to/from Stand, Bed to chair/wheelchair/BSC Sit to Stand: Min assist, Mod assist   Step pivot transfers: Mod assist       General transfer comment: increased time, labored movement    Ambulation/Gait Ambulation/Gait assistance: Min assist, Mod assist Gait Distance (Feet): 25 Feet Assistive device: Rolling walker (2 wheels) Gait Pattern/deviations: Decreased step length - right, Decreased step length - left, Decreased stride length, Trunk flexed, Knees buckling Gait velocity: decreased     General Gait Details: slow labored movement with occasional buckling of knees and limited mostly due to c/o fatigue, generalized weakness  Stairs            Wheelchair Mobility     Tilt Bed    Modified Rankin (Stroke Patients Only)  Balance Overall balance assessment: Needs assistance Sitting-balance support: Feet supported, No upper extremity supported Sitting balance-Leahy Scale: Fair Sitting balance - Comments: seated at EOB   Standing balance support: Bilateral upper extremity supported, During functional activity,  Reliant on assistive device for balance Standing balance-Leahy Scale: Poor Standing balance comment: fair/poor using RW                             Pertinent Vitals/Pain Pain Assessment Pain Assessment: Faces Pain Location: upper neck, arms, hands Pain Descriptors / Indicators: Numbness Pain Intervention(s): Monitored during session    Home Living Family/patient expects to be discharged to:: Private residence Living Arrangements: Children Available Help at Discharge: Family;Available PRN/intermittently Type of Home: House Home Access: Stairs to enter Entrance Stairs-Rails: None Entrance Stairs-Number of Steps: 2   Home Layout: One level Home Equipment: Agricultural consultant (2 wheels);Cane - quad;Grab bars - toilet;Shower seat Additional Comments: Pt reports living in the same location as previous history.    Prior Function Prior Level of Function : Needs assist       Physical Assist : ADLs (physical);Mobility (physical) Mobility (physical): Bed mobility;Transfers;Gait;Stairs ADLs (physical): IADLs;Bathing Mobility Comments: [place] ambulation with RW ADLs Comments: Assist for bathing and IADL's.     Extremity/Trunk Assessment   Upper Extremity Assessment Upper Extremity Assessment: Defer to OT evaluation    Lower Extremity Assessment Lower Extremity Assessment: Generalized weakness    Cervical / Trunk Assessment Cervical / Trunk Assessment: Kyphotic  Communication   Communication Communication: No apparent difficulties    Cognition Arousal: Alert Behavior During Therapy: WFL for tasks assessed/performed   PT - Cognitive impairments: No apparent impairments                         Following commands: Intact       Cueing Cueing Techniques: Verbal cues, Tactile cues     General Comments      Exercises     Assessment/Plan    PT Assessment Patient needs continued PT services  PT Problem List Decreased strength;Decreased activity  tolerance;Decreased balance;Decreased mobility       PT Treatment Interventions DME instruction;Gait training;Stair training;Functional mobility training;Therapeutic activities;Therapeutic exercise;Balance training;Patient/family education    PT Goals (Current goals can be found in the Care Plan section)  Acute Rehab PT Goals Patient Stated Goal: return home after rehab PT Goal Formulation: With patient Time For Goal Achievement: 02/12/24 Potential to Achieve Goals: Good    Frequency Min 3X/week     Co-evaluation PT/OT/SLP Co-Evaluation/Treatment: Yes Reason for Co-Treatment: To address functional/ADL transfers PT goals addressed during session: Mobility/safety with mobility;Balance;Proper use of DME         AM-PAC PT "6 Clicks" Mobility  Outcome Measure Help needed turning from your back to your side while in a flat bed without using bedrails?: A Little Help needed moving from lying on your back to sitting on the side of a flat bed without using bedrails?: A Lot Help needed moving to and from a bed to a chair (including a wheelchair)?: A Lot Help needed standing up from a chair using your arms (e.g., wheelchair or bedside chair)?: A Lot Help needed to walk in hospital room?: A Lot Help needed climbing 3-5 steps with a railing? : A Lot 6 Click Score: 13    End of Session Equipment Utilized During Treatment: Gait belt Activity Tolerance: Patient tolerated treatment well;Patient limited by fatigue Patient left: in chair;with  call bell/phone within reach;with chair alarm set Nurse Communication: Mobility status PT Visit Diagnosis: Unsteadiness on feet (R26.81);Other abnormalities of gait and mobility (R26.89);Muscle weakness (generalized) (M62.81)    Time: 1610-9604 PT Time Calculation (min) (ACUTE ONLY): 22 min   Charges:   PT Evaluation $PT Eval Moderate Complexity: 1 Mod PT Treatments $Therapeutic Activity: 8-22 mins PT General Charges $$ ACUTE PT VISIT: 1  Visit         2:10 PM, 01/29/24 Walton Guppy, MPT Physical Therapist with [facility] [phone] office [phone] mobile phone

## 2024-01-30 DIAGNOSIS — R55 Syncope and collapse: Secondary | ICD-10-CM

## 2024-01-30 LAB — BASIC METABOLIC PANEL WITH GFR
Anion gap: 5 (ref 5–15)
BUN: 52 mg/dL — ABNORMAL HIGH (ref 8–23)
CO2: 21 mmol/L — ABNORMAL LOW (ref 22–32)
Calcium: 8.9 mg/dL (ref 8.9–10.3)
Chloride: 109 mmol/L (ref 98–111)
Creatinine, Ser: 1.74 mg/dL — ABNORMAL HIGH (ref 0.61–1.24)
GFR, Estimated: 36 mL/min — ABNORMAL LOW (ref >60)
Glucose, Bld: 85 mg/dL (ref 70–99)
Potassium: 4.1 mmol/L (ref 3.5–5.1)
Sodium: 135 mmol/L (ref 135–145)

## 2024-01-30 LAB — CBC
HCT: 33.7 % — ABNORMAL LOW (ref 39.0–52.0)
Hemoglobin: 11.6 g/dL — ABNORMAL LOW (ref 13.0–17.0)
MCH: 34.8 pg — ABNORMAL HIGH (ref 26.0–34.0)
MCHC: 34.4 g/dL (ref 30.0–36.0)
MCV: 101.2 fL — ABNORMAL HIGH (ref 80.0–100.0)
Platelets: 152 10*3/uL (ref 150–400)
RBC: 3.33 MIL/uL — ABNORMAL LOW (ref 4.22–5.81)
RDW: 14.4 % (ref 11.5–15.5)
WBC: 6.3 10*3/uL (ref 4.0–10.5)
nRBC: 0 % (ref 0.0–0.2)

## 2024-01-30 MED ORDER — SODIUM CHLORIDE 0.9 % IV SOLN: INTRAVENOUS | Status: AC

## 2024-01-30 NOTE — Plan of Care (Signed)

## 2024-01-30 NOTE — Progress Notes (Signed)
 PROGRESS NOTE  [name] [name] ZOX:[id number] DOB: 02-08-1929 DOA: 01/28/2024 PCP: Donnie Galea, MD  HPI/Recap of past 24 hours: [name] [name]  is a 89 y.o. male, with a history of chronic back pain, HTN, HLD, CAD, prostate cancer, CKD stage III, BPH, urinary retention, essential tremor, presents to ED secondary to episode of syncope, patient reports he tried to stand up, and then his legs gave out, he fell down, unclear if he had total loss of consciousness or not, report he hit his face on the ground. Recently has been feeling weak, lightheaded in general but nothing specific before this fall, reports he was unable to get up off the floor and called EMS, he is complaining of generalized body ache. In ED, CT head unremarkable, blood work significant for baseline creatinine of 1.6, sodium of 134, no leukocytosis, troponins negative x 2, CK WNL. [facility] hospitalist consulted to admit.    Today, patient still complaining of bilateral upper extremity soreness likely post fall, no limited range of motion, able to move upper extremities freely, no numbness just aching.  Denied any other new complaints.   Assessment/Plan: Principal Problem:   Syncope Active Problems:   Essential hypertension   CAD (coronary artery disease)   CKD (chronic kidney disease), stage III (HCC)   Abnormality of gait   DNR (do not resuscitate)   BPH (benign prostatic hyperplasia)   Syncope versus presyncope Generalized weakness/deconditioning ++Orthostatic hypotension Orthostatic hypotension positive CXR unremarkable EKG now with SR, noted prolonged PR (noted prior), PVCs ECHO with EF of 55 to 60%, no regional wall motion abnormalities, grade 1 diastolic dysfunction CT head with no acute finding Continue gentle hydration Consult PT, OT Telemetry   Bradycardia Noted prior, chronic, ongoing Heart rate noted to be 40s to 50s at rest, with right bundle branch block Hold Coreg  for now Monitor on  Tele  Acute Urinary retention Noted 821 mls on bladder scan Foley catheter placed  CKD stage IIIb Baseline creatinine 1.4-1.6 Nephrotoxic medications Continue with gentle hydration   Essential hypertension BP now stable Hold Coreg  due to bradycardia, continue with lisinopril , hold if renal function worsens or becomes hypotensive   Hyperlipidemia Continue statin   Facial bruising/wounds from fall Continue p.o. Keflex    Essential tremors Continue with home primidone     Estimated body mass index is 25.4 kg/m as calculated from the following:   Height as of this encounter: 5\' 11"  (1.803 m).   Weight as of this encounter: 82.6 kg.     Code Status: DNR  Family Communication: Daughter at bedside  Disposition Plan: Status is: Inpatient The patient will require care spanning > 2 midnights and should be moved to inpatient because: Level of care      Consultants: None  Procedures: None  Antimicrobials: Keflex   DVT prophylaxis: Heparin  [place]   Objective: Vitals:   01/29/24 1402 01/29/24 1929 01/30/24 0404 01/30/24 1213  BP: 129/61 128/61 132/68 (!) 90/56  Pulse: (!) 49 (!) 52 (!) 48 (!) 54  Resp: 18 (!) 21 16   Temp: 98.3 F (36.8 C) 97.9 F (36.6 C) 97.8 F (36.6 C) 97.7 F (36.5 C)  TempSrc:  Oral Oral Oral  SpO2: 96% 97% 100% 98%  Weight:      Height:        Intake/Output Summary (Last 24 hours) at 01/30/2024 1803 Last data filed at 01/30/2024 1219 Gross per 24 hour  Intake 720 ml  Output 1750 ml  Net -1030 ml   [employer]  01/28/24 1200 01/28/24 1750  Weight: 77.1 kg 82.6 kg    Exam: General: NAD, noted facial bruising, dried blood clots noted in nasal cavity Cardiovascular: S1, S2 present Respiratory: CTAB Abdomen: Soft, nontender, nondistended, bowel sounds present Musculoskeletal: No bilateral pedal edema noted Skin: Normal Psychiatry: Normal mood     Data Reviewed: CBC: Recent Labs  Lab 01/28/24 1227 01/29/24 0401  01/30/24 0400  WBC 6.2 7.4 6.3  NEUTROABS 4.0  --   --   HGB 12.1* 11.6* 11.6*  HCT 35.2* 33.5* 33.7*  MCV 101.7* 101.2* 101.2*  PLT 172 159 152   Basic Metabolic Panel: Recent Labs  Lab 01/28/24 1227 01/29/24 0401 01/30/24 0400  NA 134* 136 135  K 4.9 4.2 4.1  CL 107 106 109  CO2 22 21* 21*  GLUCOSE 160* 92 85  BUN 45* 54* 52*  CREATININE 1.66* 1.63* 1.74*  CALCIUM  9.0 9.2 8.9   GFR: Estimated Creatinine Clearance: 28.2 mL/min (A) (by C-G formula based on SCr of 1.74 mg/dL (H)). Liver Function Tests: Recent Labs  Lab 01/28/24 1227  AST 19  ALT 17  ALKPHOS 52  BILITOT 1.0  PROT 6.2*  ALBUMIN  3.0*   No results for input(s): "LIPASE", "AMYLASE" in the last 168 hours. No results for input(s): "AMMONIA" in the last 168 hours. Coagulation Profile: No results for input(s): "INR", "PROTIME" in the last 168 hours. Cardiac Enzymes: Recent Labs  Lab 01/28/24 1227  CKTOTAL 117   BNP (last 3 results) No results for input(s): "PROBNP" in the last 8760 hours. HbA1C: No results for input(s): "HGBA1C" in the last 72 hours. CBG: Recent Labs  Lab 01/28/24 1150  GLUCAP 167*   Lipid Profile: No results for input(s): "CHOL", "HDL", "LDLCALC", "TRIG", "CHOLHDL", "LDLDIRECT" in the last 72 hours. Thyroid  Function Tests: Recent Labs    01/28/24 1449  TSH 3.660   Anemia Panel: No results for input(s): "VITAMINB12", "FOLATE", "FERRITIN", "TIBC", "IRON", "RETICCTPCT" in the last 72 hours. Urine analysis:    Component Value Date/Time   COLORURINE YELLOW 12/27/2022 1539   APPEARANCEUR CLEAR 12/27/2022 1539   APPEARANCEUR Cloudy (A) 12/24/2022 1209   LABSPEC 1.009 12/27/2022 1539   PHURINE 6.0 12/27/2022 1539   GLUCOSEU NEGATIVE 12/27/2022 1539   HGBUR NEGATIVE 12/27/2022 1539   BILIRUBINUR NEGATIVE 12/27/2022 1539   BILIRUBINUR Negative 12/24/2022 1209   KETONESUR NEGATIVE 12/27/2022 1539   PROTEINUR 100 (A) 12/27/2022 1539   UROBILINOGEN 0.2 10/10/2021 1453    UROBILINOGEN 0.2 01/07/2014 1044   NITRITE NEGATIVE 12/27/2022 1539   LEUKOCYTESUR SMALL (A) 12/27/2022 1539   Sepsis Labs: @LABRCNTIP (procalcitonin:4,lacticidven:4)  )No results found for this or any previous visit (from the past 240 hours).    Studies: No results found.   Scheduled Meds:  allopurinol   300 mg Oral Daily   aspirin  EC  81 mg Oral Daily   atorvastatin   40 mg Oral Daily   cephALEXin   250 mg Oral Q8H   Chlorhexidine  Gluconate Cloth  6 each Topical Daily   heparin   5,000 Units Subcutaneous Q8H   lisinopril   20 mg Oral Daily   melatonin  3 mg Oral QHS   mirtazapine   7.5 mg Oral QHS   primidone   25 mg Oral Daily   senna-docusate  1-2 tablet Oral Daily   tamsulosin   0.4 mg Oral QPC supper    Continuous Infusions:  sodium chloride  75 mL/hr at 01/30/24 0950     LOS: 1 day     Taite Baldassari J Katie Faraone, MD [facility]  If 7PM-7AM, please contact night-coverage [email or web address] 01/30/2024, 6:03 PM

## 2024-01-30 NOTE — TOC Progression Note (Signed)
 Transition of Care [facility]) - Progression Note    Patient Details  Name: AMAL SAIKI MRN: [id number] Date of Birth: 07-30-1929  Transition of Care [employer]) CM/SW Contact  [place]ville Lax, [place] Phone Number: 01/30/2024, 10:51 AM  Clinical Narrative:    CSW spoke with pts daughter who is at bedside with pt to review bed offers. They would like to accept bed at [facility] at this time. CSW updated bed choice in HUB. CSW updated Richad Champagne in admissions of accepted bed. TOC to follow.   Expected Discharge Plan: Skilled Nursing Facility Barriers to Discharge: Continued Medical Work up  Expected Discharge Plan and Services In-house Referral: Clinical Social Work Discharge Planning Services: CM Consult Post Acute Care Choice: Skilled Nursing Facility Living arrangements for the past 2 months: Single Family Home                                       Social Determinants of Health (SDOH) Interventions SDOH Screenings   Food Insecurity: No Food Insecurity (01/28/2024)  Housing: Low Risk  (01/28/2024)  Transportation Needs: No Transportation Needs (01/28/2024)  Utilities: Not At Risk (01/28/2024)  Depression (PHQ2-9): High Risk (06/10/2023)  Social Connections: Unknown (01/30/2024)  Tobacco Use: Low Risk  (01/28/2024)    Readmission Risk Interventions     No data to display

## 2024-01-31 LAB — CBC
HCT: 33.4 % — ABNORMAL LOW (ref 39.0–52.0)
Hemoglobin: 10.9 g/dL — ABNORMAL LOW (ref 13.0–17.0)
MCH: 33.9 pg (ref 26.0–34.0)
MCHC: 32.6 g/dL (ref 30.0–36.0)
MCV: 103.7 fL — ABNORMAL HIGH (ref 80.0–100.0)
Platelets: 147 10*3/uL — ABNORMAL LOW (ref 150–400)
RBC: 3.22 MIL/uL — ABNORMAL LOW (ref 4.22–5.81)
RDW: 14.3 % (ref 11.5–15.5)
WBC: 6.8 10*3/uL (ref 4.0–10.5)
nRBC: 0 % (ref 0.0–0.2)

## 2024-01-31 LAB — BASIC METABOLIC PANEL WITH GFR
Anion gap: 4 — ABNORMAL LOW (ref 5–15)
BUN: 48 mg/dL — ABNORMAL HIGH (ref 8–23)
CO2: 22 mmol/L (ref 22–32)
Calcium: 8.6 mg/dL — ABNORMAL LOW (ref 8.9–10.3)
Chloride: 107 mmol/L (ref 98–111)
Creatinine, Ser: 1.57 mg/dL — ABNORMAL HIGH (ref 0.61–1.24)
GFR, Estimated: 41 mL/min — ABNORMAL LOW (ref >60)
Glucose, Bld: 91 mg/dL (ref 70–99)
Potassium: 4.1 mmol/L (ref 3.5–5.1)
Sodium: 133 mmol/L — ABNORMAL LOW (ref 135–145)

## 2024-01-31 MED ORDER — PREDNISONE 5 MG (21) PO TBPK
10.0000 mg | ORAL_TABLET | Freq: Every morning | ORAL | Status: AC
  Administered 2024-01-31: 10 mg via ORAL
  Filled 2024-01-31: qty 21

## 2024-01-31 MED ORDER — PREDNISONE 10 MG PO TABS
10.0000 mg | ORAL_TABLET | Freq: Every evening | ORAL | Status: AC
  Administered 2024-02-01: 10 mg via ORAL
  Filled 2024-01-31: qty 1

## 2024-01-31 MED ORDER — GABAPENTIN 100 MG PO CAPS
100.0000 mg | ORAL_CAPSULE | Freq: Every day | ORAL | Status: DC
Start: 2024-01-31 — End: 2024-02-03
  Administered 2024-01-31 – 2024-02-02 (×3): 100 mg via ORAL
  Filled 2024-01-31 (×3): qty 1

## 2024-01-31 MED ORDER — PREDNISONE 5 MG (21) PO TBPK
5.0000 mg | ORAL_TABLET | ORAL | Status: AC
  Administered 2024-01-31: 5 mg via ORAL

## 2024-01-31 MED ORDER — PREDNISONE 5 MG PO TABS
5.0000 mg | ORAL_TABLET | Freq: Three times a day (TID) | ORAL | Status: AC
  Administered 2024-02-01 (×3): 5 mg via ORAL
  Filled 2024-01-31 (×3): qty 1

## 2024-01-31 MED ORDER — PREDNISONE 10 MG PO TABS
10.0000 mg | ORAL_TABLET | Freq: Every evening | ORAL | Status: AC
  Administered 2024-01-31: 10 mg via ORAL

## 2024-01-31 MED ORDER — PREDNISONE 5 MG PO TABS
5.0000 mg | ORAL_TABLET | Freq: Four times a day (QID) | ORAL | Status: DC
  Administered 2024-02-02 – 2024-02-03 (×5): 5 mg via ORAL
  Filled 2024-01-31 (×5): qty 1

## 2024-01-31 NOTE — Plan of Care (Signed)

## 2024-01-31 NOTE — Progress Notes (Signed)
 PROGRESS NOTE  [name] [name] ZOX:[id number] DOB: 1929-07-04 DOA: 01/28/2024 PCP: Donnie Galea, MD  HPI/Recap of past 24 hours: [name] [name]  is a 89 y.o. male, with a history of chronic back pain, HTN, HLD, CAD, prostate cancer, CKD stage III, BPH, urinary retention, essential tremor, presents to ED secondary to episode of syncope, patient reports he tried to stand up, and then his legs gave out, he fell down, unclear if he had total loss of consciousness or not, report he hit his face on the ground. Recently has been feeling weak, lightheaded in general but nothing specific before this fall, reports he was unable to get up off the floor and called EMS, he is complaining of generalized body ache. In ED, CT head unremarkable, blood work significant for baseline creatinine of 1.6, sodium of 134, no leukocytosis, troponins negative x 2, CK WNL. [facility] hospitalist consulted to admit.    Today, patient continues to complain of bilateral upper extremity pain, numbness on the tip of his fingers, weak handgrip.  Strength in BUE WNL, noted full range of motion.   Assessment/Plan: Principal Problem:   Syncope Active Problems:   Essential hypertension   CAD (coronary artery disease)   CKD (chronic kidney disease), stage III (HCC)   Abnormality of gait   DNR (do not resuscitate)   BPH (benign prostatic hyperplasia)   Syncope versus presyncope Generalized weakness/deconditioning ++Orthostatic hypotension Noted to be orthostatic hypotension positive CXR unremarkable EKG now with SR, noted prolonged PR (noted prior), PVCs ECHO with EF of 55 to 60%, no regional wall motion abnormalities, grade 1 diastolic dysfunction CT head with no acute finding S/p gentle hydration Consult PT, OT Telemetry   Bradycardia Noted prior, chronic, ongoing Heart rate noted to be 40s to 50s at rest, with right bundle branch block Hold Coreg  for now Monitor on Tele  Acute Urinary retention Noted 821  mls on bladder scan Foley catheter place Outpatient urology follow-up  Possible C6-C7 degenerative disc disease with radiculopathy Reports BUE pain, numbness on the tip of his fingers, weak handgrip.  Strength in BUE WNL, noted full range of motion History of laminectomy and fusion of C3-C6 CT cervical spine noted mild to moderate multilevel cervical disc degenerative change, worse around C6-C7 Discussed with neurosurgery PA on 01/31/2024, recommend outpatient follow-up, for further management Start prednisone taper, low-dose gabapentin, continue Norco PT/OT  CKD stage IIIb Baseline creatinine 1.4-1.6 Nephrotoxic medications S/p gentle hydration   Essential hypertension BP now stable Hold Coreg  due to bradycardia, hold lisinopril  with soft BP   Hyperlipidemia Continue statin   Facial bruising/wounds from fall Continue p.o. Keflex    Essential tremors Continue with home primidone     Estimated body mass index is 25.4 kg/m as calculated from the following:   Height as of this encounter: 5\' 11"  (1.803 m).   Weight as of this encounter: 82.6 kg.     Code Status: DNR  Family Communication: None at bedside  Disposition Plan: Status is: Inpatient The patient will require care spanning > 2 midnights and should be moved to inpatient because: Level of care      Consultants: None  Procedures: None  Antimicrobials: Keflex   DVT prophylaxis: Heparin  [place]   Objective: Vitals:   01/30/24 1213 01/30/24 1934 01/31/24 0447 01/31/24 1252  BP: (!) 90/56 126/63 124/65 128/62  Pulse: (!) 54 (!) 55 (!) 52 (!) 50  Resp:  14 16   Temp: 97.7 F (36.5 C) (!) 97.5 F (36.4 C) 97.7 F (  36.5 C)   TempSrc: Oral Oral Oral   SpO2: 98% 98% 96% 100%  Weight:      Height:        Intake/Output Summary (Last 24 hours) at 01/31/2024 1641 Last data filed at 01/31/2024 1250 Gross per 24 hour  Intake 2007 ml  Output 2475 ml  Net -468 ml   Filed Weights   01/28/24 1200 01/28/24 1750   Weight: 77.1 kg 82.6 kg    Exam: General: NAD, noted facial bruising, dried blood clots noted in nasal cavity Cardiovascular: S1, S2 present Respiratory: CTAB Abdomen: Soft, nontender, nondistended, bowel sounds present Musculoskeletal: No bilateral pedal edema noted Skin: Normal Psychiatry: Normal mood  Neurology: BUE pain, numbness on the tip of his fingers, weak handgrip.  Strength in BUE WNL, noted full range of motion of BUE.  No other focal neurologic deficits noted    Data Reviewed: CBC: Recent Labs  Lab 01/28/24 1227 01/29/24 0401 01/30/24 0400 01/31/24 0349  WBC 6.2 7.4 6.3 6.8  NEUTROABS 4.0  --   --   --   HGB 12.1* 11.6* 11.6* 10.9*  HCT 35.2* 33.5* 33.7* 33.4*  MCV 101.7* 101.2* 101.2* 103.7*  PLT 172 159 152 147*   Basic Metabolic Panel: Recent Labs  Lab 01/28/24 1227 01/29/24 0401 01/30/24 0400 01/31/24 0349  NA 134* 136 135 133*  K 4.9 4.2 4.1 4.1  CL 107 106 109 107  CO2 22 21* 21* 22  GLUCOSE 160* 92 85 91  BUN 45* 54* 52* 48*  CREATININE 1.66* 1.63* 1.74* 1.57*  CALCIUM  9.0 9.2 8.9 8.6*   GFR: Estimated Creatinine Clearance: 31.3 mL/min (A) (by C-G formula based on SCr of 1.57 mg/dL (H)). Liver Function Tests: Recent Labs  Lab 01/28/24 1227  AST 19  ALT 17  ALKPHOS 52  BILITOT 1.0  PROT 6.2*  ALBUMIN  3.0*   No results for input(s): "LIPASE", "AMYLASE" in the last 168 hours. No results for input(s): "AMMONIA" in the last 168 hours. Coagulation Profile: No results for input(s): "INR", "PROTIME" in the last 168 hours. Cardiac Enzymes: Recent Labs  Lab 01/28/24 1227  CKTOTAL 117   BNP (last 3 results) No results for input(s): "PROBNP" in the last 8760 hours. HbA1C: No results for input(s): "HGBA1C" in the last 72 hours. CBG: Recent Labs  Lab 01/28/24 1150  GLUCAP 167*   Lipid Profile: No results for input(s): "CHOL", "HDL", "LDLCALC", "TRIG", "CHOLHDL", "LDLDIRECT" in the last 72 hours. Thyroid  Function Tests: No  results for input(s): "TSH", "T4TOTAL", "FREET4", "T3FREE", "THYROIDAB" in the last 72 hours.  Anemia Panel: No results for input(s): "VITAMINB12", "FOLATE", "FERRITIN", "TIBC", "IRON", "RETICCTPCT" in the last 72 hours. Urine analysis:    Component Value Date/Time   COLORURINE YELLOW 12/27/2022 1539   APPEARANCEUR CLEAR 12/27/2022 1539   APPEARANCEUR Cloudy (A) 12/24/2022 1209   LABSPEC 1.009 12/27/2022 1539   PHURINE 6.0 12/27/2022 1539   GLUCOSEU NEGATIVE 12/27/2022 1539   HGBUR NEGATIVE 12/27/2022 1539   BILIRUBINUR NEGATIVE 12/27/2022 1539   BILIRUBINUR Negative 12/24/2022 1209   KETONESUR NEGATIVE 12/27/2022 1539   PROTEINUR 100 (A) 12/27/2022 1539   UROBILINOGEN 0.2 10/10/2021 1453   UROBILINOGEN 0.2 01/07/2014 1044   NITRITE NEGATIVE 12/27/2022 1539   LEUKOCYTESUR SMALL (A) 12/27/2022 1539   Sepsis Labs: @LABRCNTIP (procalcitonin:4,lacticidven:4)  )No results found for this or any previous visit (from the past 240 hours).    Studies: No results found.   Scheduled Meds:  allopurinol   300 mg Oral Daily  aspirin  EC  81 mg Oral Daily   atorvastatin   40 mg Oral Daily   cephALEXin   250 mg Oral Q8H   Chlorhexidine  Gluconate Cloth  6 each Topical Daily   gabapentin  100 mg Oral QHS   heparin   5,000 Units Subcutaneous Q8H   melatonin  3 mg Oral QHS   mirtazapine   7.5 mg Oral QHS   predniSONE  10 mg Oral Nightly   [START ON 02/01/2024] predniSONE  10 mg Oral Nightly   [START ON 02/01/2024] predniSONE  5 mg Oral 3 x daily with food   [START ON 02/02/2024] predniSONE  5 mg Oral 4X daily taper   predniSONE  5 mg Oral PC supper   primidone   25 mg Oral Daily   senna-docusate  1-2 tablet Oral Daily   tamsulosin   0.4 mg Oral QPC supper    Continuous Infusions:     LOS: 2 days     Michalene Debruler J Catricia Scheerer, MD [facility]  If 7PM-7AM, please contact night-coverage [email or web address] 01/31/2024, 4:41 PM

## 2024-01-31 NOTE — Progress Notes (Signed)
 Went in to complete patient's orthostatic vitals as requested by physician. Patient declined to stand, stating pain. Provided pain medications.

## 2024-01-31 NOTE — Plan of Care (Signed)

## 2024-01-31 NOTE — Progress Notes (Signed)
 Mobility Specialist Progress Note:    01/31/24 0902  Mobility  Activity Transferred from bed to chair;Stood at bedside  Level of Assistance Maximum assist, patient does 25-49%  Assistive Device Front wheel walker  Distance Ambulated (ft) 3 ft  Range of Motion/Exercises Active;All extremities  Activity Response Tolerated well  Mobility Referral Yes  Mobility visit 1 Mobility  Mobility Specialist Start Time (ACUTE ONLY) 0902  Mobility Specialist Stop Time (ACUTE ONLY) [id number]  Mobility Specialist Time Calculation (min) (ACUTE ONLY) 14 min   Pt received in bed, NT requesting assistance transferring to chair. Required MaxA to stand and pivot with RW. Tolerated well,asx throughout. NT in room, all needs met.  Breah Joa Mobility Specialist Please contact via Special educational needs teacher or  Rehab office at [phone]

## 2024-02-01 MED ORDER — ALUM & MAG HYDROXIDE-SIMETH 200-200-20 MG/5ML PO SUSP: 30.0000 mL | Freq: Four times a day (QID) | ORAL | Status: DC | PRN

## 2024-02-01 MED ORDER — LISINOPRIL 10 MG PO TABS
20.0000 mg | ORAL_TABLET | Freq: Every day | ORAL | Status: DC
  Administered 2024-02-01 – 2024-02-03 (×3): 20 mg via ORAL
  Filled 2024-02-01 (×3): qty 2

## 2024-02-01 MED ORDER — PREDNISONE 10 MG PO TABS: 10.0000 mg | ORAL_TABLET | Freq: Every evening | ORAL | Status: DC

## 2024-02-01 MED ORDER — GABAPENTIN 100 MG PO CAPS: 100.0000 mg | ORAL_CAPSULE | Freq: Every day | ORAL | Status: DC

## 2024-02-01 MED ORDER — PREDNISONE 5 MG PO TABS: ORAL_TABLET | ORAL | Status: DC

## 2024-02-01 MED ORDER — CARVEDILOL PHOSPHATE ER 10 MG PO CP24: 10.0000 mg | ORAL_CAPSULE | Freq: Every day | ORAL | Status: DC

## 2024-02-01 MED ORDER — HYDROCODONE-ACETAMINOPHEN 5-325 MG PO TABS: 1.0000 | ORAL_TABLET | Freq: Four times a day (QID) | ORAL | 0 refills | Status: AC | PRN

## 2024-02-01 MED ORDER — CEPHALEXIN 250 MG PO CAPS: 250.0000 mg | ORAL_CAPSULE | Freq: Three times a day (TID) | ORAL | Status: DC

## 2024-02-01 MED ORDER — PANTOPRAZOLE SODIUM 40 MG PO TBEC
40.0000 mg | DELAYED_RELEASE_TABLET | Freq: Every day | ORAL | Status: DC
  Administered 2024-02-01 – 2024-02-03 (×3): 40 mg via ORAL
  Filled 2024-02-01 (×3): qty 1

## 2024-02-01 MED ORDER — SALINE SPRAY 0.65 % NA SOLN: 1.0000 | NASAL | Status: DC | PRN

## 2024-02-01 NOTE — Progress Notes (Signed)
 Patient has rested well. Vitals have been stable. No complaints this shift.

## 2024-02-01 NOTE — Progress Notes (Signed)
 PROGRESS NOTE  [name] [name]:[id number] DOB: 1929/01/18 DOA: 01/28/2024 PCP: Donnie Galea, MD  HPI/Recap of past 24 hours: [name] [name]  is a 89 y.o. male, with a history of chronic back pain, HTN, HLD, CAD, prostate cancer, CKD stage III, BPH, urinary retention, essential tremor, presents to ED secondary to episode of syncope, patient reports he tried to stand up, and then his legs gave out, he fell down, unclear if he had total loss of consciousness or not, report he hit his face on the ground. Recently has been feeling weak, lightheaded in general but nothing specific before this fall, reports he was unable to get up off the floor and called EMS, he is complaining of generalized body ache. In ED, CT head unremarkable, blood work significant for baseline creatinine of 1.6, sodium of 134, no leukocytosis, troponins negative x 2, CK WNL. [facility] hospital[facility] consulted to admit.   Today, patient reports bilateral upper extremity feels better, denies any other new complaints.   Assessment/Plan: Principal Problem:   Syncope Active Problems:   Essential hypertension   CAD (coronary artery disease)   CKD (chronic kidney disease), stage III (HCC)   Abnormality of gait   DNR (do not resuscitate)   BPH (benign prostatic hyperplasia)   Syncope versus presyncope Generalized weakness/deconditioning Orthostatic hypotension Noted to be orthostatic hypotension positive on admission CXR unremarkable EKG now with SR, noted prolonged PR (noted prior), PVCs ECHO with EF of 55 to 60%, no regional wall motion abnormalities, grade 1 diastolic dysfunction CT head with no acute finding S/p gentle hydration Consult PT, OT Telemetry   Bradycardia Noted prior, chronic, ongoing Heart rate noted to be 40s to 50s at rest, with right bundle branch block Hold Coreg  for now Monitor on Tele  Acute Urinary retention BPH Noted 821 mls on bladder scan Foley catheter place Continue  flomax  Outpatient urology follow-up for voiding trial given hx of flomax   Possible C6-C7 degenerative disc disease with radiculopathy Reports BUE pain, numbness on the tip of his fingers, weak handgrip.  Strength in BUE WNL, noted full range of motion History of laminectomy and fusion of C3-C6 CT cervical spine noted mild to moderate multilevel cervical disc degenerative change, worse around C6-C7 Discussed with neurosurgery PA on 01/31/2024, recommend outpatient follow-up, for further management Continue prednisone taper, low-dose gabapentin (may increase as pt tolerates/renal fxn), continue Norco PT/OT  CKD stage IIIb Baseline creatinine 1.4-1.6 Nephrotoxic medications S/p gentle hydration   Essential hypertension BP now stable Hold Coreg  due to bradycardia, continue lisinopril    Hyperlipidemia Continue statin   Facial bruising/wounds from fall Continue p.o. Keflex  X 5 days   Essential tremors Continue with home primidone     Estimated body mass index is 25.83 kg/m as calculated from the following:   Height as of this encounter: 5\' 11"  (1.803 m).   Weight as of this encounter: 84 kg.     Code Status: DNR  Family Communication: Daughter and grandson at bedside  Disposition Plan: Status is: Inpatient The patient will require care spanning > 2 midnights and should be moved to inpatient because: Level of care      Consultants: None  Procedures: None  Antimicrobials: Keflex   DVT prophylaxis: Heparin  [place]   Objective: Vitals:   01/31/24 0447 01/31/24 1252 01/31/24 1927 02/01/24 0435  BP: 124/65 128/62 (!) 153/78 139/69  Pulse: (!) 52 (!) 50 64 (!) 58  Resp: 16  18 14   Temp: 97.7 F (36.5 C)  98.9 F (37.2 C)  97.6 F (36.4 C)  TempSrc: Oral  Oral Oral  SpO2: 96% 100% 98% 97%  Weight:    84 kg  Height:        Intake/Output Summary (Last 24 hours) at 02/01/2024 1143 Last data filed at 02/01/2024 0800 Gross per 24 hour  Intake 960 ml  Output 2100 ml   Net -1140 ml   Filed Weights   01/28/24 1200 01/28/24 1750 02/01/24 0435  Weight: 77.1 kg 82.6 kg 84 kg    Exam: General: NAD, noted facial bruising Cardiovascular: S1, S2 present Respiratory: CTAB Abdomen: Soft, nontender, nondistended, bowel sounds present Musculoskeletal: No bilateral pedal edema noted Skin: Normal Psychiatry: Normal mood  Neurology: BUE pain, numbness on the tip of his fingers, weak handgrip.  Strength in BUE WNL, noted full range of motion of BUE.  No other focal neurologic deficits noted    Data Reviewed: CBC: Recent Labs  Lab 01/28/24 1227 01/29/24 0401 01/30/24 0400 01/31/24 0349  WBC 6.2 7.4 6.3 6.8  NEUTROABS 4.0  --   --   --   HGB 12.1* 11.6* 11.6* 10.9*  HCT 35.2* 33.5* 33.7* 33.4*  MCV 101.7* 101.2* 101.2* 103.7*  PLT 172 159 152 147*   Basic Metabolic Panel: Recent Labs  Lab 01/28/24 1227 01/29/24 0401 01/30/24 0400 01/31/24 0349  NA 134* 136 135 133*  K 4.9 4.2 4.1 4.1  CL 107 106 109 107  CO2 22 21* 21* 22  GLUCOSE 160* 92 85 91  BUN 45* 54* 52* 48*  CREATININE 1.66* 1.63* 1.74* 1.57*  CALCIUM  9.0 9.2 8.9 8.6*   GFR: Estimated Creatinine Clearance: 31.3 mL/min (A) (by C-G formula based on SCr of 1.57 mg/dL (H)). Liver Function Tests: Recent Labs  Lab 01/28/24 1227  AST 19  ALT 17  ALKPHOS 52  BILITOT 1.0  PROT 6.2*  ALBUMIN  3.0*   No results for input(s): "LIPASE", "AMYLASE" in the last 168 hours. No results for input(s): "AMMONIA" in the last 168 hours. Coagulation Profile: No results for input(s): "INR", "PROTIME" in the last 168 hours. Cardiac Enzymes: Recent Labs  Lab 01/28/24 1227  CKTOTAL 117   BNP (last 3 results) No results for input(s): "PROBNP" in the last 8760 hours. HbA1C: No results for input(s): "HGBA1C" in the last 72 hours. CBG: Recent Labs  Lab 01/28/24 1150  GLUCAP 167*   Lipid Profile: No results for input(s): "CHOL", "HDL", "LDLCALC", "TRIG", "CHOLHDL", "LDLDIRECT" in the last  72 hours. Thyroid  Function Tests: No results for input(s): "TSH", "T4TOTAL", "FREET4", "T3FREE", "THYROIDAB" in the last 72 hours.  Anemia Panel: No results for input(s): "VITAMINB12", "FOLATE", "FERRITIN", "TIBC", "IRON", "RETICCTPCT" in the last 72 hours. Urine analysis:    Component Value Date/Time   COLORURINE YELLOW 12/27/2022 1539   APPEARANCEUR CLEAR 12/27/2022 1539   APPEARANCEUR Cloudy (A) 12/24/2022 1209   LABSPEC 1.009 12/27/2022 1539   PHURINE 6.0 12/27/2022 1539   GLUCOSEU NEGATIVE 12/27/2022 1539   HGBUR NEGATIVE 12/27/2022 1539   BILIRUBINUR NEGATIVE 12/27/2022 1539   BILIRUBINUR Negative 12/24/2022 1209   KETONESUR NEGATIVE 12/27/2022 1539   PROTEINUR 100 (A) 12/27/2022 1539   UROBILINOGEN 0.2 10/10/2021 1453   UROBILINOGEN 0.2 01/07/2014 1044   NITRITE NEGATIVE 12/27/2022 1539   LEUKOCYTESUR SMALL (A) 12/27/2022 1539   Sepsis Labs: @LABRCNTIP (procalcitonin:4,lacticidven:4)  )No results found for this or any previous visit (from the past 240 hours).    Studies: No results found.   Scheduled Meds:  allopurinol   300 mg Oral Daily   aspirin   EC  81 mg Oral Daily   atorvastatin   40 mg Oral Daily   cephALEXin   250 mg Oral Q8H   Chlorhexidine  Gluconate Cloth  6 each Topical Daily   gabapentin  100 mg Oral QHS   heparin   5,000 Units Subcutaneous Q8H   melatonin  3 mg Oral QHS   mirtazapine   7.5 mg Oral QHS   predniSONE  10 mg Oral Nightly   predniSONE  5 mg Oral 3 x daily with food   [START ON 02/02/2024] predniSONE  5 mg Oral 4X daily taper   primidone   25 mg Oral Daily   senna-docusate  1-2 tablet Oral Daily   tamsulosin   0.4 mg Oral QPC supper    Continuous Infusions:     LOS: 3 days     Veronica Gordon, MD [facility]  If 7PM-7AM, please contact night-coverage [email or web address] 02/01/2024, 11:43 AM

## 2024-02-01 NOTE — Plan of Care (Signed)

## 2024-02-01 NOTE — Care Management (Signed)
 CM called [facility] and spoke with RN staff reports patient is not on the admission list to be admitted today.

## 2024-02-01 NOTE — Plan of Care (Signed)
  Problem: Nutrition: Goal: Adequate nutrition will be maintained Outcome: Progressing   Problem: Elimination: Goal: Will not experience complications related to bowel motility Outcome: Progressing   Problem: Nutrition: Goal: Adequate nutrition will be maintained Outcome: Progressing   Problem: Elimination: Goal: Will not experience complications related to bowel motility Outcome: Progressing

## 2024-02-02 LAB — BASIC METABOLIC PANEL WITH GFR
Anion gap: 11 (ref 5–15)
BUN: 55 mg/dL — ABNORMAL HIGH (ref 8–23)
CO2: 20 mmol/L — ABNORMAL LOW (ref 22–32)
Calcium: 8.7 mg/dL — ABNORMAL LOW (ref 8.9–10.3)
Chloride: 100 mmol/L (ref 98–111)
Creatinine, Ser: 1.67 mg/dL — ABNORMAL HIGH (ref 0.61–1.24)
GFR, Estimated: 38 mL/min — ABNORMAL LOW (ref >60)
Glucose, Bld: 155 mg/dL — ABNORMAL HIGH (ref 70–99)
Potassium: 4.6 mmol/L (ref 3.5–5.1)
Sodium: 131 mmol/L — ABNORMAL LOW (ref 135–145)

## 2024-02-02 MED ORDER — PREDNISONE 5 MG PO TABS: ORAL_TABLET | ORAL | Status: DC

## 2024-02-02 MED ORDER — LACTULOSE 10 GM/15ML PO SOLN
20.0000 g | Freq: Once | ORAL | Status: AC
  Administered 2024-02-02: 20 g via ORAL
  Filled 2024-02-02: qty 30

## 2024-02-02 MED ORDER — BISACODYL 10 MG RE SUPP
10.0000 mg | Freq: Once | RECTAL | Status: AC
Start: 2024-02-02 — End: 2024-02-02
  Administered 2024-02-02: 10 mg via RECTAL
  Filled 2024-02-02: qty 1

## 2024-02-02 MED ORDER — GABAPENTIN 100 MG PO CAPS: 100.0000 mg | ORAL_CAPSULE | Freq: Two times a day (BID) | ORAL | Status: DC

## 2024-02-02 MED ORDER — MAGNESIUM CITRATE PO SOLN
1.0000 | Freq: Once | ORAL | Status: AC
  Administered 2024-02-02: 1 via ORAL
  Filled 2024-02-02: qty 296

## 2024-02-02 MED ORDER — POLYETHYLENE GLYCOL 3350 17 G PO PACK
17.0000 g | PACK | Freq: Every day | ORAL | Status: DC
  Administered 2024-02-02 – 2024-02-03 (×2): 17 g via ORAL
  Filled 2024-02-02 (×2): qty 1

## 2024-02-02 NOTE — Discharge Summary (Signed)
 Physician Discharge Summary   Patient: [name] [name] MRN: [id number] DOB: 08/18/1929  Admit date:     01/28/2024  Discharge date: 02/03/24  Discharge Physician: Veronica Gordon   PCP: Donnie Galea, MD   Recommendations at discharge:   Follow-up with PCP Follow-up with neurosurgery  Discharge Diagnoses: Principal Problem:   Syncope Active Problems:   Essential hypertension   CAD (coronary artery disease)   CKD (chronic kidney disease), stage III (HCC)   Abnormality of gait   DNR (do not resuscitate)   BPH (benign prostatic hyperplasia)    Hospital Course: [name] [name]  is a 89 y.o. male, with a history of chronic back pain, HTN, HLD, CAD, prostate cancer, CKD stage III, BPH, urinary retention, essential tremor, presents to ED secondary to episode of syncope, patient reports he tried to stand up, and then his legs gave out, he fell down, unclear if he had total loss of consciousness or not, report he hit his face on the ground. Recently has been feeling weak, lightheaded in general but nothing specific before this fall, reports he was unable to get up off the floor and called EMS, he is complaining of generalized body ache. In ED, CT head unremarkable, blood work significant for baseline creatinine of 1.6, sodium of 134, no leukocytosis, troponins negative x 2, CK WNL. [facility] consulted to admit.      Today, patient continues to report improvement in his bilateral upper extremities, feels much better.  Patient reports having 2 small Bms.  Plan to discharge to SNF    Assessment and Plan:  Syncope versus presyncope Generalized weakness/deconditioning Orthostatic hypotension Noted to be orthostatic hypotension positive on admission CXR unremarkable EKG now with SR, noted prolonged PR (noted prior), PVCs ECHO with EF of 55 to 60%, no regional wall motion abnormalities, grade 1 diastolic dysfunction CT head with no acute finding S/p gentle  hydration Consult PT, OT-recommend SNF   Bradycardia Noted prior, chronic, ongoing Heart rate noted to be 40s to 50s at rest, with right bundle branch block Reduced Coreg  dose for now May benefit from following up with cardiology for medication adjustments as needed   Acute Urinary retention BPH Noted 821 mls on bladder scan Foley catheter place Continue flomax  Outpatient urology follow-up for voiding trial given hx of BPH   Possible C6-C7 degenerative disc disease with radiculopathy Reports BUE pain, numbness on the tip of his fingers, weak handgrip.  Strength in BUE WNL, noted full range of motion History of laminectomy and fusion of C3-C6 CT cervical spine noted mild to moderate multilevel cervical disc degenerative change, worse around C6-C7 Discussed with neurosurgery PA on 01/31/2024, recommend outpatient follow-up, for further management Continue prednisone taper dose, 5 mg 4 times daily x 2 days, then 5 mg 2 times daily x 1 day and complete, low-dose gabapentin (may increase as pt tolerates/renal fxn), continue Norco PT/OT-SNF   CKD stage IIIb Baseline creatinine 1.4-1.6 Avoid nephrotoxic medications S/p gentle hydration   Essential hypertension BP now stable Continue reduced dose of Coreg  due to bradycardia (hold if heart rate less than 50 ), continue lisinopril    Hyperlipidemia Continue statin   Facial bruising/wounds from fall Completed p.o. Keflex  x 5 days   Essential tremors Continue with home primidone      Consultants: None Procedures performed: None Disposition: Skilled nursing facility Diet recommendation:  Discharge Diet Orders (From admission, onward)     Start     Ordered   02/02/24 0000  Diet - low  sodium heart healthy        02/02/24 0939            DISCHARGE MEDICATION: Allergies as of 02/03/2024   No Known Allergies      Medication List     TAKE these medications    acetaminophen  500 MG tablet Commonly known as:  TYLENOL  Take 2 tablets (1,000 mg total) by mouth 3 (three) times daily as needed for mild pain or moderate pain.   allopurinol  300 MG tablet Commonly known as: ZYLOPRIM  Take 1 tablet (300 mg total) by mouth daily.   aspirin  81 MG tablet Take 81 mg by mouth daily.   atorvastatin  40 MG tablet Commonly known as: LIPITOR TAKE 1 TABLET DAILY   carvedilol  10 MG 24 hr capsule Commonly known as: COREG  CR Take 1 capsule (10 mg total) by mouth daily. What changed:  medication strength how much to take   gabapentin 100 MG capsule Commonly known as: NEURONTIN Take 1 capsule (100 mg total) by mouth 2 (two) times daily.   HYDROcodone -acetaminophen  5-325 MG tablet Commonly known as: NORCO/VICODIN Take 1 tablet by mouth every 6 (six) hours as needed for up to 3 days for moderate pain (pain score 4-6).   lisinopril  20 MG tablet Commonly known as: ZESTRIL  Take 1 tablet (20 mg total) by mouth daily.   melatonin 3 MG Tabs tablet Take 1 tablet (3 mg total) by mouth at bedtime.   mirtazapine  7.5 MG tablet Commonly known as: REMERON  TAKE 1 TABLET(7.5 MG) BY MOUTH AT BEDTIME. STOP TRAZODONE  WITH MIRTAZAPINE  USE   omeprazole  40 MG capsule Commonly known as: PRILOSEC TAKE 1 CAPSULE DAILY AS NEEDED What changed: See the new instructions.   polyethylene glycol 17 g packet Commonly known as: MIRALAX  / GLYCOLAX  Take 17 g by mouth daily.   predniSONE 5 MG tablet Commonly known as: DELTASONE Take 1 tablet (5 mg total) by mouth 4 (four) times daily for 2 days, THEN 1 tablet (5 mg total) 2 (two) times daily for 1 day. Start taking on: Feb 03, 2024   primidone  50 MG tablet Commonly known as: MYSOLINE  TAKE ONE-HALF (1/2) TABLET DAILY   senna-docusate 8.6-50 MG tablet Commonly known as: Senokot-S Take 1-2 tablets by mouth daily.   silodosin  8 MG Caps capsule Commonly known as: RAPAFLO  TAKE 1 CAPSULE AT BEDTIME   sodium chloride  0.65 % Soln nasal spray Commonly known as: OCEAN Place 1  spray into both nostrils as needed for congestion (for dried blood in nasal cavity).        Contact information for follow-up providers     Donnie Galea, MD. Schedule an appointment as soon as possible for a visit in 1 week(s).   Specialty: Family Medicine Contact information: [address] [place] [address] [phone]         Joaquin Mulberry, MD. Schedule an appointment as soon as possible for a visit.   Specialty: Neurosurgery Contact information: [address] [place] [address] [phone]         Marco Severs, MD Follow up.   Specialty: Urology Why: For voiding trial/remove Foley catheter Contact information: [address] [place] [address] [phone]              Contact information for after-discharge care     Destination     [facility] .   Service: Skilled Nursing Contact information: [address] [facility] [place]   [address] [phone]                    Discharge Exam: Filed Weights   01/28/24 1200 01/28/24 1750 02/01/24 0435  Weight: 77.1 kg 82.6 kg 84 kg   General: NAD  Cardiovascular: S1, S2 present Respiratory: CTAB Abdomen: Soft, nontender, nondistended, bowel sounds present Musculoskeletal: No bilateral pedal edema noted Skin: Normal Psychiatry: Normal mood   Condition at discharge: stable  The results of significant diagnostics from this hospitalization (including imaging, microbiology, ancillary and laboratory) are listed below for reference.   Imaging Studies: ECHOCARDIOGRAM COMPLETE Result Date: 01/29/2024    ECHOCARDIOGRAM REPORT   Patient Name:   KRIKOR WILLET Date of Exam: 01/29/2024 Medical Rec #:  [id number]          Height:       71.0 in Accession #:    [id number]         Weight:       182.1 lb Date of Birth:  09/11/1928          BSA:          2.026 m Patient Age:    89 years           BP:            137/70 mmHg Patient Gender: M                  HR:           53 bpm. Exam Location:  [facility] Procedure: 2D Echo, Color Doppler and Cardiac Doppler (Both Spectral and Color            Flow Doppler were utilized during procedure). Indications:    R55 Syncope  History:        Patient has no prior history of Echocardiogram examinations.  Sonographer:    Andrena Bang Referring Phys: 25 [address] [place] IMPRESSIONS  1. Left ventricular ejection fraction, by estimation, is 55 to 60%. The left ventricle has normal function. The left ventricle has no regional wall motion abnormalities. There is mild left ventricular hypertrophy. Left ventricular diastolic parameters are consistent with Grade I diastolic dysfunction (impaired relaxation).  2. Right ventricular systolic function is normal. The right ventricular size is normal.  3. Left atrial size was mildly dilated.  4. The mitral valve is abnormal. Trivial mitral valve regurgitation. No evidence of mitral stenosis.  5. The aortic valve is tricuspid. There is moderate calcification of the aortic valve. There is moderate thickening of the aortic valve. Aortic valve regurgitation is not visualized. Mild aortic valve stenosis.  6. Aortic dilatation noted. There is mild dilatation of the aortic root, measuring 38 mm. There is moderate dilatation of the ascending aorta, measuring 41 mm.  7. The inferior vena cava is normal in size with greater than 50% respiratory variability, suggesting right atrial pressure of 3 mmHg. FINDINGS  Left Ventricle: Left ventricular ejection fraction, by estimation, is 55 to 60%. The left ventricle has normal function. The left ventricle has no regional wall motion abnormalities. Strain was performed and the global longitudinal strain is indeterminate. The left ventricular internal cavity size was normal in size. There is mild left ventricular hypertrophy. Left ventricular diastolic parameters are consistent with Grade I diastolic dysfunction  (impaired relaxation). Right Ventricle: The right ventricular size is normal. No increase in right ventricular wall thickness. Right ventricular systolic function is normal. Left Atrium: Left atrial size was mildly dilated. Right Atrium: Right atrial size  was normal in size. Pericardium: There is no evidence of pericardial effusion. Mitral Valve: The mitral valve is abnormal. There is mild thickening of the mitral valve leaflet(s). There is mild calcification of the mitral valve leaflet(s). Mild mitral annular calcification. Trivial mitral valve regurgitation. No evidence of mitral valve stenosis. Tricuspid Valve: The tricuspid valve is normal in structure. Tricuspid valve regurgitation is not demonstrated. No evidence of tricuspid stenosis. Aortic Valve: The aortic valve is tricuspid. There is moderate calcification of the aortic valve. There is moderate thickening of the aortic valve. Aortic valve regurgitation is not visualized. Mild aortic stenosis is present. Aortic valve mean gradient measures 4.0 mmHg. Aortic valve peak gradient measures 9.4 mmHg. Aortic valve area, by VTI measures 1.79 cm. Pulmonic Valve: The pulmonic valve was normal in structure. Pulmonic valve regurgitation is not visualized. No evidence of pulmonic stenosis. Aorta: Aortic dilatation noted. There is mild dilatation of the aortic root, measuring 38 mm. There is moderate dilatation of the ascending aorta, measuring 41 mm. Venous: The inferior vena cava is normal in size with greater than 50% respiratory variability, suggesting right atrial pressure of 3 mmHg. IAS/Shunts: The interatrial septum was not well visualized. Additional Comments: 3D was performed not requiring image post processing on an independent workstation and was indeterminate.  LEFT VENTRICLE PLAX 2D LVIDd:         4.90 cm      Diastology LVIDs:         3.80 cm      LV e' medial:    5.98 cm/s LV PW:         1.10 cm      LV E/e' medial:  10.4 LV IVS:        1.20 cm      LV  e' lateral:   6.53 cm/s LVOT diam:     2.10 cm      LV E/e' lateral: 9.6 LV SV:         60 LV SV Index:   30 LVOT Area:     3.46 cm  LV Volumes (MOD) LV vol d, MOD A2C: 112.0 ml LV vol d, MOD A4C: 89.6 ml LV vol s, MOD A2C: 33.8 ml LV vol s, MOD A4C: 31.2 ml LV SV MOD A2C:     78.2 ml LV SV MOD A4C:     89.6 ml LV SV MOD BP:      68.1 ml RIGHT VENTRICLE RV S prime:     5.98 cm/s TAPSE (M-mode): 1.1 cm LEFT ATRIUM             Index LA diam:        4.10 cm 2.02 cm/m LA Vol (A2C):   56.1 ml 27.69 ml/m LA Vol (A4C):   63.4 ml 31.29 ml/m LA Biplane Vol: 59.1 ml 29.17 ml/m  AORTIC VALVE AV Area (Vmax):    1.61 cm AV Area (Vmean):   1.65 cm AV Area (VTI):     1.79 cm AV Vmax:           153.00 cm/s AV Vmean:          91.100 cm/s AV VTI:            0.335 m AV Peak Grad:      9.4 mmHg AV Mean Grad:      4.0 mmHg LVOT Vmax:         71.10 cm/s LVOT Vmean:        43.300 cm/s LVOT VTI:  0.173 m LVOT/AV VTI ratio: 0.52  AORTA Ao Asc diam: 4.10 cm MITRAL VALVE MV Area (PHT): 2.67 cm    SHUNTS MV Decel Time: 284 msec    Systemic VTI:  0.17 m MV E velocity: 62.40 cm/s  Systemic Diam: 2.10 cm MV A velocity: 96.80 cm/s MV E/A ratio:  0.64 Janelle Mediate MD Electronically signed by Janelle Mediate MD Signature Date/Time: 01/29/2024/2:45:08 PM    Final    DG Pelvis Portable Result Date: 01/28/2024 CLINICAL DATA:  Fall. EXAM: PORTABLE PELVIS 1-2 VIEWS COMPARISON:  None Available. FINDINGS: There is no evidence of pelvic fracture or diastasis. No pelvic bone lesions are seen. IMPRESSION: Negative. Electronically Signed   By: Rosalene Colon M.D.   On: 01/28/2024 14:46   DG Chest Portable 1 View Result Date: 01/28/2024 CLINICAL DATA:  Fall. EXAM: PORTABLE CHEST 1 VIEW COMPARISON:  October 24, 2023. FINDINGS: Stable cardiomegaly. Status post coronary artery bypass graft. No acute pulmonary disease. Bony thorax is unremarkable. IMPRESSION: No active disease. Electronically Signed   By: Rosalene Colon M.D.   On: 01/28/2024  14:45   CT Head Wo Contrast Result Date: 01/28/2024 CLINICAL DATA:  Syncope, fall, head injury EXAM: CT HEAD WITHOUT CONTRAST CT MAXILLOFACIAL WITHOUT CONTRAST CT CERVICAL SPINE WITHOUT CONTRAST TECHNIQUE: Multidetector CT imaging of the head, cervical spine, and maxillofacial structures were performed using the standard protocol without intravenous contrast. Multiplanar CT image reconstructions of the cervical spine and maxillofacial structures were also generated. RADIATION DOSE REDUCTION: This exam was performed according to the departmental dose-optimization program which includes automated exposure control, adjustment of the mA and/or kV according to patient size and/or use of iterative reconstruction technique. COMPARISON:  08/13/2022 FINDINGS: CT HEAD FINDINGS Brain: No evidence of acute infarction, hemorrhage, hydrocephalus, extra-axial collection or mass lesion/mass effect. Extensive periventricular and deep white matter hypodensity. Vascular: No hyperdense vessel or unexpected calcification. CT FACIAL BONES FINDINGS Skull: Normal. Negative for fracture or focal lesion. Facial bones: No displaced fractures or dislocations. Sinuses/Orbits: No acute finding. Other: Patient is edentulous. CT CERVICAL SPINE FINDINGS Alignment: Postoperative straightening of the normal cervical lordosis. Skull base and vertebrae: No acute fracture. No primary bone lesion or focal pathologic process. Soft tissues and spinal canal: No prevertebral fluid or swelling. No visible canal hematoma. Disc levels: Status post posterior laminectomy and fusion of C3-C6. Mild-to-moderate multilevel cervical disc degenerative change, worst at C6-C7. Upper chest: Negative. Other: None. IMPRESSION: 1. No acute intracranial pathology. Advanced small-vessel white matter disease, in keeping with patient age. 2. No displaced fractures or dislocations of the facial bones. 3. No fracture or static subluxation of the cervical spine. 4. Status post  posterior laminectomy and fusion of C3-C6. Mild-to-moderate multilevel cervical disc degenerative change, worst at C6-C7. Electronically Signed   By: Fredricka Jenny M.D.   On: 01/28/2024 13:27   CT Maxillofacial Wo Contrast Result Date: 01/28/2024 CLINICAL DATA:  Syncope, fall, head injury EXAM: CT HEAD WITHOUT CONTRAST CT MAXILLOFACIAL WITHOUT CONTRAST CT CERVICAL SPINE WITHOUT CONTRAST TECHNIQUE: Multidetector CT imaging of the head, cervical spine, and maxillofacial structures were performed using the standard protocol without intravenous contrast. Multiplanar CT image reconstructions of the cervical spine and maxillofacial structures were also generated. RADIATION DOSE REDUCTION: This exam was performed according to the departmental dose-optimization program which includes automated exposure control, adjustment of the mA and/or kV according to patient size and/or use of iterative reconstruction technique. COMPARISON:  08/13/2022 FINDINGS: CT HEAD FINDINGS Brain: No evidence of acute infarction, hemorrhage, hydrocephalus, extra-axial  collection or mass lesion/mass effect. Extensive periventricular and deep white matter hypodensity. Vascular: No hyperdense vessel or unexpected calcification. CT FACIAL BONES FINDINGS Skull: Normal. Negative for fracture or focal lesion. Facial bones: No displaced fractures or dislocations. Sinuses/Orbits: No acute finding. Other: Patient is edentulous. CT CERVICAL SPINE FINDINGS Alignment: Postoperative straightening of the normal cervical lordosis. Skull base and vertebrae: No acute fracture. No primary bone lesion or focal pathologic process. Soft tissues and spinal canal: No prevertebral fluid or swelling. No visible canal hematoma. Disc levels: Status post posterior laminectomy and fusion of C3-C6. Mild-to-moderate multilevel cervical disc degenerative change, worst at C6-C7. Upper chest: Negative. Other: None. IMPRESSION: 1. No acute intracranial pathology. Advanced  small-vessel white matter disease, in keeping with patient age. 2. No displaced fractures or dislocations of the facial bones. 3. No fracture or static subluxation of the cervical spine. 4. Status post posterior laminectomy and fusion of C3-C6. Mild-to-moderate multilevel cervical disc degenerative change, worst at C6-C7. Electronically Signed   By: Fredricka Jenny M.D.   On: 01/28/2024 13:27   CT Cervical Spine Wo Contrast Result Date: 01/28/2024 CLINICAL DATA:  Syncope, fall, head injury EXAM: CT HEAD WITHOUT CONTRAST CT MAXILLOFACIAL WITHOUT CONTRAST CT CERVICAL SPINE WITHOUT CONTRAST TECHNIQUE: Multidetector CT imaging of the head, cervical spine, and maxillofacial structures were performed using the standard protocol without intravenous contrast. Multiplanar CT image reconstructions of the cervical spine and maxillofacial structures were also generated. RADIATION DOSE REDUCTION: This exam was performed according to the departmental dose-optimization program which includes automated exposure control, adjustment of the mA and/or kV according to patient size and/or use of iterative reconstruction technique. COMPARISON:  08/13/2022 FINDINGS: CT HEAD FINDINGS Brain: No evidence of acute infarction, hemorrhage, hydrocephalus, extra-axial collection or mass lesion/mass effect. Extensive periventricular and deep white matter hypodensity. Vascular: No hyperdense vessel or unexpected calcification. CT FACIAL BONES FINDINGS Skull: Normal. Negative for fracture or focal lesion. Facial bones: No displaced fractures or dislocations. Sinuses/Orbits: No acute finding. Other: Patient is edentulous. CT CERVICAL SPINE FINDINGS Alignment: Postoperative straightening of the normal cervical lordosis. Skull base and vertebrae: No acute fracture. No primary bone lesion or focal pathologic process. Soft tissues and spinal canal: No prevertebral fluid or swelling. No visible canal hematoma. Disc levels: Status post posterior  laminectomy and fusion of C3-C6. Mild-to-moderate multilevel cervical disc degenerative change, worst at C6-C7. Upper chest: Negative. Other: None. IMPRESSION: 1. No acute intracranial pathology. Advanced small-vessel white matter disease, in keeping with patient age. 2. No displaced fractures or dislocations of the facial bones. 3. No fracture or static subluxation of the cervical spine. 4. Status post posterior laminectomy and fusion of C3-C6. Mild-to-moderate multilevel cervical disc degenerative change, worst at C6-C7. Electronically Signed   By: Fredricka Jenny M.D.   On: 01/28/2024 13:27    Microbiology: Results for orders placed or performed during the hospital encounter of 12/27/22  Urine Culture     Status: None   Collection Time: 12/27/22  3:39 PM   Specimen: Urine, Clean Catch  Result Value Ref Range Status   Specimen Description   Final    URINE, CLEAN CATCH Performed at [facility], [address] [place], [place] [address]    Special Requests   Final    NONE Performed at [facility], [address], [place], [place] [address]    Culture   Final    NO GROWTH Performed at [facility], [address] [place], [place] [address]    Report Status 12/29/2022 FINAL  Final    Labs: CBC: Recent Labs  Lab 01/28/24 1227 01/29/24 0401 01/30/24 0400 01/31/24 0349  WBC 6.2 7.4 6.3 6.8  NEUTROABS 4.0  --   --   --   HGB 12.1* 11.6* 11.6* 10.9*  HCT 35.2* 33.5* 33.7* 33.4*  MCV 101.7* 101.2* 101.2* 103.7*  PLT 172 159 152 147*   Basic Metabolic Panel: Recent Labs  Lab 01/28/24 1227 01/29/24 0401 01/30/24 0400 01/31/24 0349 02/02/24 0549  NA 134* 136 135 133* 131*  K 4.9 4.2 4.1 4.1 4.6  CL 107 106 109 107 100  CO2 22 21* 21* 22 20*  GLUCOSE 160* 92 85 91 155*  BUN 45* 54* 52* 48* 55*  CREATININE 1.66* 1.63* 1.74* 1.57* 1.67*  CALCIUM  9.0 9.2 8.9 8.6* 8.7*   Liver Function Tests: Recent Labs  Lab 01/28/24 1227  AST 19  ALT 17  ALKPHOS 52  BILITOT 1.0   PROT 6.2*  ALBUMIN  3.0*   CBG: Recent Labs  Lab 01/28/24 1150  GLUCAP 167*    Discharge time spent: greater than 30 minutes.  Signed: Veronica Gordon, MD [facility] 02/03/2024

## 2024-02-02 NOTE — TOC Transition Note (Signed)
 Transition of Care [facility]) - Discharge Note   Patient Details  Name: [name] [name] MRN: [id number] Date of Birth: 1929/08/07  [facility] CM/SW Contact:  Grandville Lax, LCSWA Phone Number: 02/02/2024, 11:44 AM   Clinical Narrative:    CSW updated that pt is medically stable for D/C to [facility] today. CSW sent D/C clinicals to [facility] in admissions who confirms they can accept. CSW sent D/C clinicals to facility via [facility]. Pts daughter updated on plan for D/C. RN provided with room and report numbers. Med necessity sent to floor for RN. EMS to be called when RN is ready. TOC signing off.   Final next level of care: Skilled Nursing Facility Barriers to Discharge: Barriers Resolved   Patient Goals and CMS Choice Patient states their goals for this hospitalization and ongoing recovery are:: go to SNF CMS Medicare.gov Compare Post Acute Care list provided to:: Patient Choice offered to / list presented to : Patient      Discharge Placement                Patient to be transferred to facility by: EMS Name of family member notified: Daughter Patient and family notified of of transfer: 02/02/24  Discharge Plan and Services Additional resources added to the After Visit Summary for   In-house Referral: Clinical Social Work Discharge Planning Services: CM Consult Post Acute Care Choice: Skilled Nursing Facility                               Social Drivers of Health (SDOH) Interventions SDOH Screenings   Food Insecurity: No Food Insecurity (01/28/2024)  Housing: Low Risk  (01/28/2024)  Transportation Needs: No Transportation Needs (01/28/2024)  Utilities: Not At Risk (01/28/2024)  Depression (PHQ2-9): High Risk (06/10/2023)  Social Connections: Socially Isolated (01/30/2024)  Tobacco Use: Low Risk  (01/28/2024)     Readmission Risk Interventions     No data to display

## 2024-02-02 NOTE — Plan of Care (Signed)
  Problem: Education: Goal: Knowledge of General Education information will improve Description: Including pain rating scale, medication(s)/side effects and non-pharmacologic comfort measures Outcome: Adequate for Discharge   Problem: Health Behavior/Discharge Planning: Goal: Ability to manage health-related needs will improve Outcome: Adequate for Discharge   Problem: Clinical Measurements: Goal: Ability to maintain clinical measurements within normal limits will improve Outcome: Adequate for Discharge Goal: Will remain free from infection Outcome: Adequate for Discharge Goal: Diagnostic test results will improve Outcome: Adequate for Discharge Goal: Respiratory complications will improve Outcome: Adequate for Discharge Goal: Cardiovascular complication will be avoided Outcome: Adequate for Discharge   Problem: Activity: Goal: Risk for activity intolerance will decrease Outcome: Adequate for Discharge   Problem: Nutrition: Goal: Adequate nutrition will be maintained Outcome: Adequate for Discharge   Problem: Coping: Goal: Level of anxiety will decrease Outcome: Adequate for Discharge   Problem: Elimination: Goal: Will not experience complications related to bowel motility Outcome: Adequate for Discharge Goal: Will not experience complications related to urinary retention Outcome: Adequate for Discharge   Problem: Pain Managment: Goal: General experience of comfort will improve and/or be controlled Outcome: Adequate for Discharge   Problem: Safety: Goal: Ability to remain free from injury will improve Outcome: Adequate for Discharge   Problem: Skin Integrity: Goal: Risk for impaired skin integrity will decrease Outcome: Adequate for Discharge   Problem: Acute Rehab OT Goals (only OT should resolve) Goal: Pt. Will Perform Eating Outcome: Adequate for Discharge Goal: Pt. Will Perform Grooming Outcome: Adequate for Discharge Goal: Pt. Will Perform Upper Body  Dressing Outcome: Adequate for Discharge Goal: Pt. Will Perform Lower Body Dressing Outcome: Adequate for Discharge Goal: Pt. Will Transfer To Toilet Outcome: Adequate for Discharge Goal: Pt. Will Perform Toileting-Clothing Manipulation Outcome: Adequate for Discharge Goal: Pt/Caregiver Will Perform Home Exercise Program Outcome: Adequate for Discharge   Problem: Acute Rehab PT Goals(only PT should resolve) Goal: Pt Will Go Supine/Side To Sit Outcome: Adequate for Discharge Goal: Patient Will Transfer Sit To/From Stand Outcome: Adequate for Discharge Goal: Pt Will Transfer Bed To Chair/Chair To Bed Outcome: Adequate for Discharge Goal: Pt Will Ambulate Outcome: Adequate for Discharge

## 2024-02-02 NOTE — Progress Notes (Signed)
 Physical Therapy Treatment Patient Details Name: [name] [name] MRN: [id number] DOB: [birth year] Today's Date: 02/02/2024   History of Present Illness [name] [name]  is a 89 y.o. male, with a history of chronic back pain, hypertension, hyperlipidemia, coronary disease, prostate cancer, CKD stage III, BPH, urinary retention, essential tremor .  - Patient presents to ED secondary to episode of syncope, patient reports he tried to stand up, and then his legs gave out, he fell down, unclear if he had total loss of consciousness or not, report he hit his face on the ground, denies any chest pain, palpitation, reports recently has been feeling weak, lightheaded in general but nothing specific before this fall, reports he was unable to get up off the floor and called EMS, he is complaining of generalized body ache, pain, headache, any nausea, chest pain or shortness of breath.  - in ED  he was noted to be in A-fib with RVR, started on Cardizem drip, converted to normal sinus rhythm, CT chest/abdomen/pelvis significant for T11 vertebral fracture, blood work significant for baseline creatinine of 1.6 KX, sodium of 134, no leukocytosis, troponins negative x 2, [facility] hospitalist consulted to admit.    PT Comments  Pt c/o pain in hands and neck, premedicated and monitored through session.  Pt present with slow labored movements with bed mobility, transfer and gait with min to mod A required for safety.   Pt's knee buckling occassionaly and cueing to stand within the walker as tendency to push too far forward.  Able to ambulate to hallway today though required increased assistance returning to chair due to weakness and fatigue.  EOS pt left in chair with call bell within reach, RN aware of status.   If plan is discharge home, recommend the following:     Can travel by private vehicle        Equipment Recommendations       Recommendations for Other Services       Precautions / Restrictions  Precautions Precautions: Fall Recall of Precautions/Restrictions: Intact Restrictions Weight Bearing Restrictions Per Provider Order: No     Mobility  Bed Mobility Overal bed mobility: Needs Assistance Bed Mobility: Supine to Sit     Supine to sit: Min assist, Mod assist, HOB elevated     General bed mobility comments: slow labored movement    Transfers Overall transfer level: Needs assistance Equipment used: Rolling walker (2 wheels) Transfers: Sit to/from Stand Sit to Stand: Min assist, Mod assist           General transfer comment: increased time, labored movement    Ambulation/Gait Ambulation/Gait assistance: Min assist, Mod assist Gait Distance (Feet): 25 Feet Assistive device: Rolling walker (2 wheels) Gait Pattern/deviations: Decreased step length - right, Decreased step length - left, Decreased stride length, Trunk flexed, Knees buckling Gait velocity: decreased     General Gait Details: slow labored movement with occasional buckling of knees and limited mostly due to c/o fatigue, generalized weakness   Stairs             Wheelchair Mobility     Tilt Bed    Modified Rankin (Stroke Patients Only)       Balance                                            Communication    Cognition Arousal: Alert Behavior During Therapy: [facility]  for tasks assessed/performed   PT - Cognitive impairments: No apparent impairments                                Cueing    Exercises      General Comments        Pertinent Vitals/Pain Pain Assessment Pain Score: 6  Pain Location: upper neck, arms, hands Pain Descriptors / Indicators: Numbness, Discomfort Pain Intervention(s): Monitored during session, Repositioned, Premedicated before session (Pillow in lap to support UE at EOS in chair)    Home Living                          Prior Function            PT Goals (current goals can now be found in the care  plan section)      Frequency           PT Plan      Co-evaluation              AM-PAC PT "6 Clicks" Mobility   Outcome Measure  Help needed turning from your back to your side while in a flat bed without using bedrails?: A Little Help needed moving from lying on your back to sitting on the side of a flat bed without using bedrails?: A Lot Help needed moving to and from a bed to a chair (including a wheelchair)?: A Lot Help needed standing up from a chair using your arms (e.g., wheelchair or bedside chair)?: A Lot Help needed to walk in hospital room?: A Lot Help needed climbing 3-5 steps with a railing? : A Lot 6 Click Score: 13    End of Session Equipment Utilized During Treatment: Gait belt Activity Tolerance: Patient tolerated treatment well;Patient limited by fatigue Patient left: in chair;with call bell/phone within reach;with chair alarm set         Time: 0912-0928 PT Time Calculation (min) (ACUTE ONLY): 16 min  Charges:    $Therapeutic Activity: 8-22 mins PT General Charges $$ ACUTE PT VISIT: 1 Visit                     [name] [name], LPTA/CLT; CBIS [phone]  [name] [name] 02/02/2024, 9:57 AM

## 2024-02-03 DIAGNOSIS — N179 Acute kidney failure, unspecified: Secondary | ICD-10-CM | POA: Diagnosis present

## 2024-02-03 DIAGNOSIS — R55 Syncope and collapse: Secondary | ICD-10-CM | POA: Diagnosis not present

## 2024-02-03 DIAGNOSIS — M549 Dorsalgia, unspecified: Secondary | ICD-10-CM | POA: Diagnosis present

## 2024-02-03 DIAGNOSIS — R1311 Dysphagia, oral phase: Secondary | ICD-10-CM | POA: Diagnosis present

## 2024-02-03 DIAGNOSIS — M109 Gout, unspecified: Secondary | ICD-10-CM | POA: Diagnosis present

## 2024-02-03 DIAGNOSIS — Z8546 Personal history of malignant neoplasm of prostate: Secondary | ICD-10-CM | POA: Diagnosis not present

## 2024-02-03 DIAGNOSIS — E441 Mild protein-calorie malnutrition: Secondary | ICD-10-CM | POA: Diagnosis present

## 2024-02-03 DIAGNOSIS — R262 Difficulty in walking, not elsewhere classified: Secondary | ICD-10-CM | POA: Diagnosis present

## 2024-02-03 DIAGNOSIS — R001 Bradycardia, unspecified: Secondary | ICD-10-CM | POA: Diagnosis present

## 2024-02-03 DIAGNOSIS — R5381 Other malaise: Secondary | ICD-10-CM | POA: Diagnosis not present

## 2024-02-03 DIAGNOSIS — N189 Chronic kidney disease, unspecified: Secondary | ICD-10-CM | POA: Diagnosis not present

## 2024-02-03 DIAGNOSIS — R251 Tremor, unspecified: Secondary | ICD-10-CM | POA: Diagnosis present

## 2024-02-03 DIAGNOSIS — G992 Myelopathy in diseases classified elsewhere: Secondary | ICD-10-CM | POA: Diagnosis present

## 2024-02-03 DIAGNOSIS — D84821 Immunodeficiency due to drugs: Secondary | ICD-10-CM | POA: Diagnosis present

## 2024-02-03 DIAGNOSIS — R339 Retention of urine, unspecified: Secondary | ICD-10-CM | POA: Diagnosis present

## 2024-02-03 DIAGNOSIS — D649 Anemia, unspecified: Secondary | ICD-10-CM | POA: Diagnosis not present

## 2024-02-03 DIAGNOSIS — M6259 Muscle wasting and atrophy, not elsewhere classified, multiple sites: Secondary | ICD-10-CM | POA: Diagnosis not present

## 2024-02-03 DIAGNOSIS — R5383 Other fatigue: Secondary | ICD-10-CM | POA: Diagnosis present

## 2024-02-03 DIAGNOSIS — M4802 Spinal stenosis, cervical region: Secondary | ICD-10-CM | POA: Diagnosis present

## 2024-02-03 DIAGNOSIS — I2510 Atherosclerotic heart disease of native coronary artery without angina pectoris: Secondary | ICD-10-CM | POA: Diagnosis present

## 2024-02-03 DIAGNOSIS — M6281 Muscle weakness (generalized): Secondary | ICD-10-CM | POA: Diagnosis present

## 2024-02-03 DIAGNOSIS — R2689 Other abnormalities of gait and mobility: Secondary | ICD-10-CM | POA: Diagnosis not present

## 2024-02-03 DIAGNOSIS — S0993XD Unspecified injury of face, subsequent encounter: Secondary | ICD-10-CM | POA: Diagnosis not present

## 2024-02-03 DIAGNOSIS — R41841 Cognitive communication deficit: Secondary | ICD-10-CM | POA: Diagnosis not present

## 2024-02-03 DIAGNOSIS — M4982 Spondylopathy in diseases classified elsewhere, cervical region: Secondary | ICD-10-CM | POA: Diagnosis not present

## 2024-02-03 DIAGNOSIS — N400 Enlarged prostate without lower urinary tract symptoms: Secondary | ICD-10-CM | POA: Diagnosis present

## 2024-02-03 DIAGNOSIS — I10 Essential (primary) hypertension: Secondary | ICD-10-CM | POA: Diagnosis present

## 2024-02-03 MED ORDER — SENNOSIDES-DOCUSATE SODIUM 8.6-50 MG PO TABS: 1.0000 | ORAL_TABLET | Freq: Two times a day (BID) | ORAL | Status: DC

## 2024-02-03 MED ORDER — POLYETHYLENE GLYCOL 3350 17 G PO PACK: 17.0000 g | PACK | Freq: Two times a day (BID) | ORAL | Status: DC

## 2024-02-03 MED ORDER — POLYETHYLENE GLYCOL 3350 17 G PO PACK: 17.0000 g | PACK | Freq: Every day | ORAL | Status: DC

## 2024-02-03 MED ORDER — PREDNISONE 5 MG PO TABS: ORAL_TABLET | ORAL | Status: AC

## 2024-02-03 MED ORDER — BISACODYL 5 MG PO TBEC
10.0000 mg | DELAYED_RELEASE_TABLET | Freq: Once | ORAL | Status: AC
Start: 2024-02-03 — End: 2024-02-03
  Administered 2024-02-03: 10 mg via ORAL
  Filled 2024-02-03: qty 2

## 2024-02-03 MED ORDER — SMOG ENEMA
960.0000 mL | Freq: Once | RECTAL | Status: DC
  Filled 2024-02-03: qty 960

## 2024-02-03 NOTE — Progress Notes (Signed)
 PROGRESS NOTE  [name] [name] LKG:[id number] DOB: 11-Feb-1929 DOA: 01/28/2024 PCP: Donnie Galea, MD  HPI/Recap of past 24 hours: [name] [name]  is a 89 y.o. male, with a history of chronic back pain, HTN, HLD, CAD, prostate cancer, CKD stage III, BPH, urinary retention, essential tremor, presents to ED secondary to episode of syncope, patient reports he tried to stand up, and then his legs gave out, he fell down, unclear if he had total loss of consciousness or not, report he hit his face on the ground. Recently has been feeling weak, lightheaded in general but nothing specific before this fall, reports he was unable to get up off the floor and called EMS, he is complaining of generalized body ache. In ED, CT head unremarkable, blood work significant for baseline creatinine of 1.6, sodium of 134, no leukocytosis, troponins negative x 2, CK WNL. [facility] hospitalist consulted to admit.    Today, patient continues to report improvement in his bilateral upper extremities, feels much better.  Patient reports having 2 small Bms.  Plan to discharge to SNF    Assessment/Plan: Principal Problem:   Syncope Active Problems:   Essential hypertension   CAD (coronary artery disease)   CKD (chronic kidney disease), stage III (HCC)   Abnormality of gait   DNR (do not resuscitate)   BPH (benign prostatic hyperplasia)   Syncope versus presyncope Generalized weakness/deconditioning Orthostatic hypotension Noted to be orthostatic hypotension positive on admission CXR unremarkable EKG now with SR, noted prolonged PR (noted prior), PVCs ECHO with EF of 55 to 60%, no regional wall motion abnormalities, grade 1 diastolic dysfunction CT head with no acute finding S/p gentle hydration Consult PT, OT Telemetry   Bradycardia Noted prior, chronic, ongoing Heart rate noted to be 40s to 50s at rest, with right bundle branch block  Acute Urinary retention BPH Noted 821 mls on bladder scan Foley  catheter place Continue flomax  Outpatient urology follow-up for voiding trial given hx of flomax   Possible C6-C7 degenerative disc disease with radiculopathy Reports BUE pain, numbness on the tip of his fingers, weak handgrip.  Strength in BUE WNL, noted full range of motion History of laminectomy and fusion of C3-C6 CT cervical spine noted mild to moderate multilevel cervical disc degenerative change, worse around C6-C7 Discussed with neurosurgery PA on 01/31/2024, recommend outpatient follow-up, for further management Continue prednisone taper, low-dose gabapentin (may increase as pt tolerates/renal fxn), continue Norco PT/OT  CKD stage IIIb Baseline creatinine 1.4-1.6 Nephrotoxic medications S/p gentle hydration   Essential hypertension BP now stable   Hyperlipidemia Continue statin   Facial bruising/wounds from fall Completed p.o. Keflex  X 5 days   Essential tremors Continue with home primidone     Estimated body mass index is 25.83 kg/m as calculated from the following:   Height as of this encounter: 5\' 11"  (1.803 m).   Weight as of this encounter: 84 kg.     Code Status: DNR  Family Communication: Daughter and grandson at bedside  Disposition Plan: Status is: Inpatient The patient will require care spanning > 2 midnights and should be moved to inpatient because: Level of care      Consultants: None  Procedures: None  Antimicrobials: Completed Keflex   DVT prophylaxis: Heparin  [place]   Objective: Vitals:   02/02/24 1317 02/02/24 1945 02/02/24 1945 02/03/24 0351  BP: (!) 143/76 133/77 133/77 135/70  Pulse: (!) 56 (!) 53 (!) 53 (!) 53  Resp: 16 19 19 18   Temp: 97.8 F (36.6 C) 97.7  F (36.5 C) 97.7 F (36.5 C) (!) 97.5 F (36.4 C)  TempSrc: Oral Oral Oral Oral  SpO2: 97% 97% 97% 97%  Weight:      Height:        Intake/Output Summary (Last 24 hours) at 02/03/2024 0942 Last data filed at 02/03/2024 0356 Gross per 24 hour  Intake --  Output 1600  ml  Net -1600 ml   Filed Weights   01/28/24 1200 01/28/24 1750 02/01/24 0435  Weight: 77.1 kg 82.6 kg 84 kg    Exam: General: NAD Cardiovascular: S1, S2 present Respiratory: CTAB Abdomen: Soft, nontender, nondistended, bowel sounds present Musculoskeletal: No bilateral pedal edema noted Skin: Normal Psychiatry: Normal mood     Data Reviewed: CBC: Recent Labs  Lab 01/28/24 1227 01/29/24 0401 01/30/24 0400 01/31/24 0349  WBC 6.2 7.4 6.3 6.8  NEUTROABS 4.0  --   --   --   HGB 12.1* 11.6* 11.6* 10.9*  HCT 35.2* 33.5* 33.7* 33.4*  MCV 101.7* 101.2* 101.2* 103.7*  PLT 172 159 152 147*   Basic Metabolic Panel: Recent Labs  Lab 01/28/24 1227 01/29/24 0401 01/30/24 0400 01/31/24 0349 02/02/24 0549  NA 134* 136 135 133* 131*  K 4.9 4.2 4.1 4.1 4.6  CL 107 106 109 107 100  CO2 22 21* 21* 22 20*  GLUCOSE 160* 92 85 91 155*  BUN 45* 54* 52* 48* 55*  CREATININE 1.66* 1.63* 1.74* 1.57* 1.67*  CALCIUM  9.0 9.2 8.9 8.6* 8.7*   GFR: Estimated Creatinine Clearance: 29.4 mL/min (A) (by C-G formula based on SCr of 1.67 mg/dL (H)). Liver Function Tests: Recent Labs  Lab 01/28/24 1227  AST 19  ALT 17  ALKPHOS 52  BILITOT 1.0  PROT 6.2*  ALBUMIN  3.0*   No results for input(s): "LIPASE", "AMYLASE" in the last 168 hours. No results for input(s): "AMMONIA" in the last 168 hours. Coagulation Profile: No results for input(s): "INR", "PROTIME" in the last 168 hours. Cardiac Enzymes: Recent Labs  Lab 01/28/24 1227  CKTOTAL 117   BNP (last 3 results) No results for input(s): "PROBNP" in the last 8760 hours. HbA1C: No results for input(s): "HGBA1C" in the last 72 hours. CBG: Recent Labs  Lab 01/28/24 1150  GLUCAP 167*   Lipid Profile: No results for input(s): "CHOL", "HDL", "LDLCALC", "TRIG", "CHOLHDL", "LDLDIRECT" in the last 72 hours. Thyroid  Function Tests: No results for input(s): "TSH", "T4TOTAL", "FREET4", "T3FREE", "THYROIDAB" in the last 72  hours.  Anemia Panel: No results for input(s): "VITAMINB12", "FOLATE", "FERRITIN", "TIBC", "IRON", "RETICCTPCT" in the last 72 hours. Urine analysis:    Component Value Date/Time   COLORURINE YELLOW 12/27/2022 1539   APPEARANCEUR CLEAR 12/27/2022 1539   APPEARANCEUR Cloudy (A) 12/24/2022 1209   LABSPEC 1.009 12/27/2022 1539   PHURINE 6.0 12/27/2022 1539   GLUCOSEU NEGATIVE 12/27/2022 1539   HGBUR NEGATIVE 12/27/2022 1539   BILIRUBINUR NEGATIVE 12/27/2022 1539   BILIRUBINUR Negative 12/24/2022 1209   KETONESUR NEGATIVE 12/27/2022 1539   PROTEINUR 100 (A) 12/27/2022 1539   UROBILINOGEN 0.2 10/10/2021 1453   UROBILINOGEN 0.2 01/07/2014 1044   NITRITE NEGATIVE 12/27/2022 1539   LEUKOCYTESUR SMALL (A) 12/27/2022 1539   Sepsis Labs: @LABRCNTIP (procalcitonin:4,lacticidven:4)  )No results found for this or any previous visit (from the past 240 hours).    Studies: No results found.   Scheduled Meds:  allopurinol   300 mg Oral Daily   aspirin  EC  81 mg Oral Daily   atorvastatin   40 mg Oral Daily   cephALEXin   250 mg Oral Q8H   Chlorhexidine  Gluconate Cloth  6 each Topical Daily   gabapentin  100 mg Oral QHS   heparin   5,000 Units Subcutaneous Q8H   lisinopril   20 mg Oral Daily   melatonin  3 mg Oral QHS   mirtazapine   7.5 mg Oral QHS   pantoprazole   40 mg Oral Daily   polyethylene glycol  17 g Oral Daily   predniSONE  5 mg Oral 4X daily taper   primidone   25 mg Oral Daily   senna-docusate  1-2 tablet Oral Daily   tamsulosin   0.4 mg Oral QPC supper    Continuous Infusions:     LOS: 5 days     Veronica Gordon, MD [facility]  If 7PM-7AM, please contact night-coverage [email or web address] 02/03/2024, 9:42 AM

## 2024-02-03 NOTE — Progress Notes (Signed)
 Pt has not produced a bm overnight. Pt  requested an order for Dulcolax. Stated he takes it at home when he hasn't had a bm in several days. Pt also stated it usually works within four hours. Contacted on call physician. Dulcolax order placed. Will continue to monitor pt for the remainder of shift for any changes.

## 2024-02-03 NOTE — TOC Transition Note (Signed)
 Transition of Care [facility]) - Discharge Note   Patient Details  Name: [name] [name] MRN: [id number] Date of Birth: 06/10/1929  [facility] CM/SW Contact:  Grandville Lax, LCSWA Phone Number: 02/03/2024, 10:55 AM  Clinical Narrative:    CSW updated that D/C was held due to pt not having bowel movement. Pt now stable for D/C. CSW spoke with Richad Champagne at [facility], they are ready to accept pt. RN provided with room and report. EMS called for transport. CSW left secure VM for pts daughter with update. Med necessity on floor for RN. TOC signing off.   Final next level of care: Skilled Nursing Facility Barriers to Discharge: Barriers Resolved   Patient Goals and CMS Choice Patient states their goals for this hospitalization and ongoing recovery are:: go to SNF CMS Medicare.gov Compare Post Acute Care list provided to:: Patient Choice offered to / list presented to : Patient      Discharge Placement                Patient to be transferred to facility by: EMS Name of family member notified: Daughter Patient and family notified of of transfer: 02/02/24  Discharge Plan and Services Additional resources added to the After Visit Summary for   In-house Referral: Clinical Social Work Discharge Planning Services: CM Consult Post Acute Care Choice: Skilled Nursing Facility                               Social Drivers of Health (SDOH) Interventions SDOH Screenings   Food Insecurity: No Food Insecurity (01/28/2024)  Housing: Low Risk  (01/28/2024)  Transportation Needs: No Transportation Needs (01/28/2024)  Utilities: Not At Risk (01/28/2024)  Depression (PHQ2-9): High Risk (06/10/2023)  Social Connections: Socially Isolated (01/30/2024)  Tobacco Use: Low Risk  (01/28/2024)     Readmission Risk Interventions     No data to display

## 2024-02-04 DIAGNOSIS — M109 Gout, unspecified: Secondary | ICD-10-CM | POA: Diagnosis not present

## 2024-02-04 DIAGNOSIS — Z8546 Personal history of malignant neoplasm of prostate: Secondary | ICD-10-CM | POA: Diagnosis not present

## 2024-02-04 DIAGNOSIS — N400 Enlarged prostate without lower urinary tract symptoms: Secondary | ICD-10-CM | POA: Diagnosis not present

## 2024-02-04 DIAGNOSIS — M4802 Spinal stenosis, cervical region: Secondary | ICD-10-CM | POA: Diagnosis not present

## 2024-02-04 DIAGNOSIS — R251 Tremor, unspecified: Secondary | ICD-10-CM | POA: Diagnosis not present

## 2024-02-04 DIAGNOSIS — R339 Retention of urine, unspecified: Secondary | ICD-10-CM | POA: Diagnosis not present

## 2024-02-04 DIAGNOSIS — N179 Acute kidney failure, unspecified: Secondary | ICD-10-CM | POA: Diagnosis not present

## 2024-02-04 DIAGNOSIS — I10 Essential (primary) hypertension: Secondary | ICD-10-CM | POA: Diagnosis not present

## 2024-02-04 DIAGNOSIS — R5383 Other fatigue: Secondary | ICD-10-CM | POA: Diagnosis not present

## 2024-02-04 DIAGNOSIS — I2510 Atherosclerotic heart disease of native coronary artery without angina pectoris: Secondary | ICD-10-CM | POA: Diagnosis not present

## 2024-02-04 DIAGNOSIS — G992 Myelopathy in diseases classified elsewhere: Secondary | ICD-10-CM | POA: Diagnosis not present

## 2024-02-06 ENCOUNTER — Ambulatory Visit: Admitting: Family Medicine

## 2024-02-08 DIAGNOSIS — R5381 Other malaise: Secondary | ICD-10-CM | POA: Diagnosis not present

## 2024-02-08 DIAGNOSIS — M4802 Spinal stenosis, cervical region: Secondary | ICD-10-CM | POA: Diagnosis not present

## 2024-02-10 DIAGNOSIS — T83712A Erosion of implanted urethral mesh to surrounding organ or tissue, initial encounter: Secondary | ICD-10-CM | POA: Diagnosis not present

## 2024-02-10 DIAGNOSIS — E1122 Type 2 diabetes mellitus with diabetic chronic kidney disease: Secondary | ICD-10-CM | POA: Diagnosis present

## 2024-02-10 DIAGNOSIS — R001 Bradycardia, unspecified: Secondary | ICD-10-CM | POA: Diagnosis not present

## 2024-02-10 DIAGNOSIS — I4510 Unspecified right bundle-branch block: Secondary | ICD-10-CM | POA: Diagnosis not present

## 2024-02-10 DIAGNOSIS — R55 Syncope and collapse: Secondary | ICD-10-CM | POA: Diagnosis not present

## 2024-02-10 DIAGNOSIS — I10 Essential (primary) hypertension: Secondary | ICD-10-CM | POA: Diagnosis not present

## 2024-02-10 DIAGNOSIS — R5381 Other malaise: Secondary | ICD-10-CM | POA: Diagnosis not present

## 2024-02-10 DIAGNOSIS — B3742 Candidal balanitis: Secondary | ICD-10-CM | POA: Diagnosis not present

## 2024-02-10 DIAGNOSIS — M4800 Spinal stenosis, site unspecified: Secondary | ICD-10-CM | POA: Diagnosis not present

## 2024-02-10 DIAGNOSIS — R339 Retention of urine, unspecified: Secondary | ICD-10-CM | POA: Diagnosis not present

## 2024-02-10 DIAGNOSIS — D631 Anemia in chronic kidney disease: Secondary | ICD-10-CM | POA: Diagnosis present

## 2024-02-10 DIAGNOSIS — I129 Hypertensive chronic kidney disease with stage 1 through stage 4 chronic kidney disease, or unspecified chronic kidney disease: Secondary | ICD-10-CM | POA: Diagnosis present

## 2024-02-10 DIAGNOSIS — T839XXA Unspecified complication of genitourinary prosthetic device, implant and graft, initial encounter: Secondary | ICD-10-CM | POA: Diagnosis not present

## 2024-02-10 DIAGNOSIS — R369 Urethral discharge, unspecified: Secondary | ICD-10-CM | POA: Diagnosis present

## 2024-02-10 DIAGNOSIS — R627 Adult failure to thrive: Secondary | ICD-10-CM | POA: Diagnosis present

## 2024-02-10 DIAGNOSIS — M48062 Spinal stenosis, lumbar region with neurogenic claudication: Secondary | ICD-10-CM | POA: Diagnosis not present

## 2024-02-10 DIAGNOSIS — N189 Chronic kidney disease, unspecified: Secondary | ICD-10-CM | POA: Diagnosis not present

## 2024-02-10 DIAGNOSIS — N281 Cyst of kidney, acquired: Secondary | ICD-10-CM | POA: Diagnosis not present

## 2024-02-10 DIAGNOSIS — E785 Hyperlipidemia, unspecified: Secondary | ICD-10-CM | POA: Diagnosis present

## 2024-02-10 DIAGNOSIS — I110 Hypertensive heart disease with heart failure: Secondary | ICD-10-CM | POA: Diagnosis not present

## 2024-02-10 DIAGNOSIS — Z951 Presence of aortocoronary bypass graft: Secondary | ICD-10-CM | POA: Diagnosis not present

## 2024-02-10 DIAGNOSIS — G992 Myelopathy in diseases classified elsewhere: Secondary | ICD-10-CM | POA: Diagnosis not present

## 2024-02-10 DIAGNOSIS — R5383 Other fatigue: Secondary | ICD-10-CM | POA: Diagnosis not present

## 2024-02-10 DIAGNOSIS — Z7189 Other specified counseling: Secondary | ICD-10-CM | POA: Diagnosis not present

## 2024-02-10 DIAGNOSIS — T83518A Infection and inflammatory reaction due to other urinary catheter, initial encounter: Secondary | ICD-10-CM | POA: Diagnosis present

## 2024-02-10 DIAGNOSIS — Y846 Urinary catheterization as the cause of abnormal reaction of the patient, or of later complication, without mention of misadventure at the time of the procedure: Secondary | ICD-10-CM | POA: Diagnosis present

## 2024-02-10 DIAGNOSIS — R41841 Cognitive communication deficit: Secondary | ICD-10-CM | POA: Diagnosis not present

## 2024-02-10 DIAGNOSIS — N179 Acute kidney failure, unspecified: Secondary | ICD-10-CM | POA: Diagnosis not present

## 2024-02-10 DIAGNOSIS — Z960 Presence of urogenital implants: Secondary | ICD-10-CM | POA: Diagnosis not present

## 2024-02-10 DIAGNOSIS — B9620 Unspecified Escherichia coli [E. coli] as the cause of diseases classified elsewhere: Secondary | ICD-10-CM | POA: Diagnosis present

## 2024-02-10 DIAGNOSIS — M109 Gout, unspecified: Secondary | ICD-10-CM | POA: Diagnosis not present

## 2024-02-10 DIAGNOSIS — E039 Hypothyroidism, unspecified: Secondary | ICD-10-CM | POA: Diagnosis present

## 2024-02-10 DIAGNOSIS — Z741 Need for assistance with personal care: Secondary | ICD-10-CM | POA: Diagnosis not present

## 2024-02-10 DIAGNOSIS — N368 Other specified disorders of urethra: Secondary | ICD-10-CM | POA: Diagnosis present

## 2024-02-10 DIAGNOSIS — Z79899 Other long term (current) drug therapy: Secondary | ICD-10-CM | POA: Diagnosis not present

## 2024-02-10 DIAGNOSIS — N1832 Chronic kidney disease, stage 3b: Secondary | ICD-10-CM | POA: Diagnosis not present

## 2024-02-10 DIAGNOSIS — L89152 Pressure ulcer of sacral region, stage 2: Secondary | ICD-10-CM | POA: Diagnosis present

## 2024-02-10 DIAGNOSIS — I2510 Atherosclerotic heart disease of native coronary artery without angina pectoris: Secondary | ICD-10-CM | POA: Diagnosis not present

## 2024-02-10 DIAGNOSIS — Z8546 Personal history of malignant neoplasm of prostate: Secondary | ICD-10-CM | POA: Diagnosis not present

## 2024-02-10 DIAGNOSIS — N183 Chronic kidney disease, stage 3 unspecified: Secondary | ICD-10-CM | POA: Diagnosis not present

## 2024-02-10 DIAGNOSIS — N390 Urinary tract infection, site not specified: Secondary | ICD-10-CM | POA: Diagnosis present

## 2024-02-10 DIAGNOSIS — Z8249 Family history of ischemic heart disease and other diseases of the circulatory system: Secondary | ICD-10-CM | POA: Diagnosis not present

## 2024-02-10 DIAGNOSIS — N401 Enlarged prostate with lower urinary tract symptoms: Secondary | ICD-10-CM | POA: Diagnosis not present

## 2024-02-10 DIAGNOSIS — E871 Hypo-osmolality and hyponatremia: Secondary | ICD-10-CM | POA: Diagnosis present

## 2024-02-10 DIAGNOSIS — R102 Pelvic and perineal pain: Secondary | ICD-10-CM | POA: Diagnosis not present

## 2024-02-10 DIAGNOSIS — D649 Anemia, unspecified: Secondary | ICD-10-CM | POA: Diagnosis not present

## 2024-02-10 DIAGNOSIS — Z515 Encounter for palliative care: Secondary | ICD-10-CM | POA: Diagnosis not present

## 2024-02-10 DIAGNOSIS — R2689 Other abnormalities of gait and mobility: Secondary | ICD-10-CM | POA: Diagnosis not present

## 2024-02-10 DIAGNOSIS — M6281 Muscle weakness (generalized): Secondary | ICD-10-CM | POA: Diagnosis not present

## 2024-02-10 DIAGNOSIS — M6250 Muscle wasting and atrophy, not elsewhere classified, unspecified site: Secondary | ICD-10-CM | POA: Diagnosis not present

## 2024-02-10 DIAGNOSIS — M4802 Spinal stenosis, cervical region: Secondary | ICD-10-CM | POA: Diagnosis not present

## 2024-02-10 DIAGNOSIS — K219 Gastro-esophageal reflux disease without esophagitis: Secondary | ICD-10-CM | POA: Diagnosis present

## 2024-02-10 DIAGNOSIS — M4982 Spondylopathy in diseases classified elsewhere, cervical region: Secondary | ICD-10-CM | POA: Diagnosis not present

## 2024-02-10 DIAGNOSIS — Q549 Hypospadias, unspecified: Secondary | ICD-10-CM | POA: Diagnosis not present

## 2024-02-10 DIAGNOSIS — M6259 Muscle wasting and atrophy, not elsewhere classified, multiple sites: Secondary | ICD-10-CM | POA: Diagnosis not present

## 2024-02-10 DIAGNOSIS — C61 Malignant neoplasm of prostate: Secondary | ICD-10-CM | POA: Diagnosis not present

## 2024-02-10 DIAGNOSIS — D84821 Immunodeficiency due to drugs: Secondary | ICD-10-CM | POA: Diagnosis not present

## 2024-02-10 DIAGNOSIS — G250 Essential tremor: Secondary | ICD-10-CM | POA: Diagnosis not present

## 2024-02-10 DIAGNOSIS — N400 Enlarged prostate without lower urinary tract symptoms: Secondary | ICD-10-CM | POA: Diagnosis not present

## 2024-02-10 DIAGNOSIS — Y738 Miscellaneous gastroenterology and urology devices associated with adverse incidents, not elsewhere classified: Secondary | ICD-10-CM | POA: Diagnosis present

## 2024-02-10 DIAGNOSIS — Z66 Do not resuscitate: Secondary | ICD-10-CM | POA: Diagnosis not present

## 2024-02-11 DIAGNOSIS — N179 Acute kidney failure, unspecified: Secondary | ICD-10-CM | POA: Diagnosis not present

## 2024-02-11 DIAGNOSIS — I4510 Unspecified right bundle-branch block: Secondary | ICD-10-CM | POA: Diagnosis not present

## 2024-02-11 DIAGNOSIS — N189 Chronic kidney disease, unspecified: Secondary | ICD-10-CM | POA: Diagnosis not present

## 2024-02-11 DIAGNOSIS — R5381 Other malaise: Secondary | ICD-10-CM | POA: Diagnosis not present

## 2024-02-11 DIAGNOSIS — N400 Enlarged prostate without lower urinary tract symptoms: Secondary | ICD-10-CM | POA: Diagnosis not present

## 2024-02-11 DIAGNOSIS — M6250 Muscle wasting and atrophy, not elsewhere classified, unspecified site: Secondary | ICD-10-CM | POA: Diagnosis not present

## 2024-02-11 DIAGNOSIS — R5383 Other fatigue: Secondary | ICD-10-CM | POA: Diagnosis not present

## 2024-02-11 DIAGNOSIS — I10 Essential (primary) hypertension: Secondary | ICD-10-CM | POA: Diagnosis not present

## 2024-02-11 DIAGNOSIS — G250 Essential tremor: Secondary | ICD-10-CM | POA: Diagnosis not present

## 2024-02-11 DIAGNOSIS — D649 Anemia, unspecified: Secondary | ICD-10-CM | POA: Diagnosis not present

## 2024-02-11 DIAGNOSIS — M4800 Spinal stenosis, site unspecified: Secondary | ICD-10-CM | POA: Diagnosis not present

## 2024-02-11 DIAGNOSIS — N183 Chronic kidney disease, stage 3 unspecified: Secondary | ICD-10-CM | POA: Diagnosis not present

## 2024-02-11 DIAGNOSIS — G992 Myelopathy in diseases classified elsewhere: Secondary | ICD-10-CM | POA: Diagnosis not present

## 2024-02-11 DIAGNOSIS — I2510 Atherosclerotic heart disease of native coronary artery without angina pectoris: Secondary | ICD-10-CM | POA: Diagnosis not present

## 2024-02-11 DIAGNOSIS — R339 Retention of urine, unspecified: Secondary | ICD-10-CM | POA: Diagnosis not present

## 2024-02-11 DIAGNOSIS — N401 Enlarged prostate with lower urinary tract symptoms: Secondary | ICD-10-CM | POA: Diagnosis not present

## 2024-02-11 DIAGNOSIS — Z8546 Personal history of malignant neoplasm of prostate: Secondary | ICD-10-CM | POA: Diagnosis not present

## 2024-02-11 DIAGNOSIS — Z960 Presence of urogenital implants: Secondary | ICD-10-CM | POA: Diagnosis not present

## 2024-02-11 DIAGNOSIS — R55 Syncope and collapse: Secondary | ICD-10-CM | POA: Diagnosis not present

## 2024-02-11 DIAGNOSIS — R001 Bradycardia, unspecified: Secondary | ICD-10-CM | POA: Diagnosis not present

## 2024-02-11 DIAGNOSIS — M109 Gout, unspecified: Secondary | ICD-10-CM | POA: Diagnosis not present

## 2024-02-11 DIAGNOSIS — M4802 Spinal stenosis, cervical region: Secondary | ICD-10-CM | POA: Diagnosis not present

## 2024-02-13 DIAGNOSIS — R55 Syncope and collapse: Secondary | ICD-10-CM | POA: Diagnosis not present

## 2024-02-13 DIAGNOSIS — M6259 Muscle wasting and atrophy, not elsewhere classified, multiple sites: Secondary | ICD-10-CM | POA: Diagnosis not present

## 2024-02-13 DIAGNOSIS — I4510 Unspecified right bundle-branch block: Secondary | ICD-10-CM | POA: Diagnosis not present

## 2024-02-13 DIAGNOSIS — G250 Essential tremor: Secondary | ICD-10-CM | POA: Diagnosis not present

## 2024-02-13 DIAGNOSIS — N401 Enlarged prostate with lower urinary tract symptoms: Secondary | ICD-10-CM | POA: Diagnosis not present

## 2024-02-13 DIAGNOSIS — I110 Hypertensive heart disease with heart failure: Secondary | ICD-10-CM | POA: Diagnosis not present

## 2024-02-13 DIAGNOSIS — Z741 Need for assistance with personal care: Secondary | ICD-10-CM | POA: Diagnosis not present

## 2024-02-13 DIAGNOSIS — M48062 Spinal stenosis, lumbar region with neurogenic claudication: Secondary | ICD-10-CM | POA: Diagnosis not present

## 2024-02-13 DIAGNOSIS — N1832 Chronic kidney disease, stage 3b: Secondary | ICD-10-CM | POA: Diagnosis not present

## 2024-02-13 DIAGNOSIS — R339 Retention of urine, unspecified: Secondary | ICD-10-CM | POA: Diagnosis not present

## 2024-02-13 DIAGNOSIS — R2689 Other abnormalities of gait and mobility: Secondary | ICD-10-CM | POA: Diagnosis not present

## 2024-02-13 DIAGNOSIS — R001 Bradycardia, unspecified: Secondary | ICD-10-CM | POA: Diagnosis not present

## 2024-02-13 DIAGNOSIS — B3742 Candidal balanitis: Secondary | ICD-10-CM | POA: Diagnosis not present

## 2024-02-13 DIAGNOSIS — M6281 Muscle weakness (generalized): Secondary | ICD-10-CM | POA: Diagnosis not present

## 2024-02-16 ENCOUNTER — Other Ambulatory Visit: Payer: Self-pay

## 2024-02-16 ENCOUNTER — Inpatient Hospital Stay (HOSPITAL_COMMUNITY)

## 2024-02-16 ENCOUNTER — Emergency Department (HOSPITAL_COMMUNITY)

## 2024-02-16 ENCOUNTER — Encounter (HOSPITAL_COMMUNITY): Payer: Self-pay

## 2024-02-16 ENCOUNTER — Inpatient Hospital Stay (HOSPITAL_COMMUNITY)
Admission: EM | Admit: 2024-02-16 | Discharge: 2024-02-19 | DRG: 699 | Disposition: A | Discharge status: Hospice | Source: Skilled Nursing Facility | Attending: Internal Medicine | Admitting: Internal Medicine

## 2024-02-16 DIAGNOSIS — L2989 Other pruritus: Secondary | ICD-10-CM | POA: Diagnosis not present

## 2024-02-16 DIAGNOSIS — R627 Adult failure to thrive: Secondary | ICD-10-CM | POA: Diagnosis present

## 2024-02-16 DIAGNOSIS — Z961 Presence of intraocular lens: Secondary | ICD-10-CM | POA: Diagnosis present

## 2024-02-16 DIAGNOSIS — M50123 Cervical disc disorder at C6-C7 level with radiculopathy: Secondary | ICD-10-CM | POA: Diagnosis present

## 2024-02-16 DIAGNOSIS — E785 Hyperlipidemia, unspecified: Secondary | ICD-10-CM | POA: Diagnosis present

## 2024-02-16 DIAGNOSIS — Z7401 Bed confinement status: Secondary | ICD-10-CM | POA: Diagnosis not present

## 2024-02-16 DIAGNOSIS — K219 Gastro-esophageal reflux disease without esophagitis: Secondary | ICD-10-CM | POA: Diagnosis not present

## 2024-02-16 DIAGNOSIS — E1122 Type 2 diabetes mellitus with diabetic chronic kidney disease: Secondary | ICD-10-CM | POA: Diagnosis present

## 2024-02-16 DIAGNOSIS — T83712A Erosion of implanted urethral mesh to surrounding organ or tissue, initial encounter: Secondary | ICD-10-CM | POA: Diagnosis not present

## 2024-02-16 DIAGNOSIS — L89152 Pressure ulcer of sacral region, stage 2: Secondary | ICD-10-CM | POA: Diagnosis present

## 2024-02-16 DIAGNOSIS — N368 Other specified disorders of urethra: Secondary | ICD-10-CM | POA: Diagnosis present

## 2024-02-16 DIAGNOSIS — G250 Essential tremor: Secondary | ICD-10-CM | POA: Diagnosis present

## 2024-02-16 DIAGNOSIS — R5383 Other fatigue: Secondary | ICD-10-CM | POA: Diagnosis not present

## 2024-02-16 DIAGNOSIS — Z951 Presence of aortocoronary bypass graft: Secondary | ICD-10-CM | POA: Diagnosis not present

## 2024-02-16 DIAGNOSIS — N179 Acute kidney failure, unspecified: Secondary | ICD-10-CM | POA: Diagnosis not present

## 2024-02-16 DIAGNOSIS — I129 Hypertensive chronic kidney disease with stage 1 through stage 4 chronic kidney disease, or unspecified chronic kidney disease: Secondary | ICD-10-CM | POA: Diagnosis present

## 2024-02-16 DIAGNOSIS — Z981 Arthrodesis status: Secondary | ICD-10-CM

## 2024-02-16 DIAGNOSIS — I2510 Atherosclerotic heart disease of native coronary artery without angina pectoris: Secondary | ICD-10-CM | POA: Diagnosis present

## 2024-02-16 DIAGNOSIS — B9620 Unspecified Escherichia coli [E. coli] as the cause of diseases classified elsewhere: Secondary | ICD-10-CM | POA: Diagnosis present

## 2024-02-16 DIAGNOSIS — Y738 Miscellaneous gastroenterology and urology devices associated with adverse incidents, not elsewhere classified: Secondary | ICD-10-CM | POA: Diagnosis present

## 2024-02-16 DIAGNOSIS — M4982 Spondylopathy in diseases classified elsewhere, cervical region: Secondary | ICD-10-CM | POA: Diagnosis not present

## 2024-02-16 DIAGNOSIS — R102 Pelvic and perineal pain: Secondary | ICD-10-CM | POA: Diagnosis not present

## 2024-02-16 DIAGNOSIS — Z85828 Personal history of other malignant neoplasm of skin: Secondary | ICD-10-CM

## 2024-02-16 DIAGNOSIS — Y846 Urinary catheterization as the cause of abnormal reaction of the patient, or of later complication, without mention of misadventure at the time of the procedure: Secondary | ICD-10-CM | POA: Diagnosis present

## 2024-02-16 DIAGNOSIS — R339 Retention of urine, unspecified: Secondary | ICD-10-CM | POA: Diagnosis not present

## 2024-02-16 DIAGNOSIS — Z79899 Other long term (current) drug therapy: Secondary | ICD-10-CM | POA: Diagnosis not present

## 2024-02-16 DIAGNOSIS — T83712D Erosion of implanted urethral mesh to surrounding organ or tissue, subsequent encounter: Secondary | ICD-10-CM | POA: Diagnosis not present

## 2024-02-16 DIAGNOSIS — D631 Anemia in chronic kidney disease: Secondary | ICD-10-CM | POA: Diagnosis present

## 2024-02-16 DIAGNOSIS — Q549 Hypospadias, unspecified: Secondary | ICD-10-CM

## 2024-02-16 DIAGNOSIS — B9689 Other specified bacterial agents as the cause of diseases classified elsewhere: Secondary | ICD-10-CM | POA: Diagnosis present

## 2024-02-16 DIAGNOSIS — E039 Hypothyroidism, unspecified: Secondary | ICD-10-CM | POA: Diagnosis present

## 2024-02-16 DIAGNOSIS — Z9842 Cataract extraction status, left eye: Secondary | ICD-10-CM

## 2024-02-16 DIAGNOSIS — Z66 Do not resuscitate: Secondary | ICD-10-CM | POA: Diagnosis not present

## 2024-02-16 DIAGNOSIS — Z789 Other specified health status: Secondary | ICD-10-CM

## 2024-02-16 DIAGNOSIS — D84821 Immunodeficiency due to drugs: Secondary | ICD-10-CM | POA: Diagnosis not present

## 2024-02-16 DIAGNOSIS — N390 Urinary tract infection, site not specified: Secondary | ICD-10-CM | POA: Diagnosis present

## 2024-02-16 DIAGNOSIS — T839XXA Unspecified complication of genitourinary prosthetic device, implant and graft, initial encounter: Secondary | ICD-10-CM | POA: Diagnosis not present

## 2024-02-16 DIAGNOSIS — K5900 Constipation, unspecified: Secondary | ICD-10-CM | POA: Diagnosis present

## 2024-02-16 DIAGNOSIS — M6281 Muscle weakness (generalized): Secondary | ICD-10-CM | POA: Diagnosis not present

## 2024-02-16 DIAGNOSIS — Z8546 Personal history of malignant neoplasm of prostate: Secondary | ICD-10-CM

## 2024-02-16 DIAGNOSIS — I10 Essential (primary) hypertension: Secondary | ICD-10-CM | POA: Diagnosis not present

## 2024-02-16 DIAGNOSIS — R682 Dry mouth, unspecified: Secondary | ICD-10-CM | POA: Diagnosis not present

## 2024-02-16 DIAGNOSIS — T83518A Infection and inflammatory reaction due to other urinary catheter, initial encounter: Principal | ICD-10-CM | POA: Diagnosis present

## 2024-02-16 DIAGNOSIS — E46 Unspecified protein-calorie malnutrition: Secondary | ICD-10-CM | POA: Diagnosis not present

## 2024-02-16 DIAGNOSIS — M6259 Muscle wasting and atrophy, not elsewhere classified, multiple sites: Secondary | ICD-10-CM | POA: Diagnosis not present

## 2024-02-16 DIAGNOSIS — L8990 Pressure ulcer of unspecified site, unspecified stage: Secondary | ICD-10-CM | POA: Diagnosis present

## 2024-02-16 DIAGNOSIS — R55 Syncope and collapse: Secondary | ICD-10-CM | POA: Diagnosis not present

## 2024-02-16 DIAGNOSIS — Z7189 Other specified counseling: Secondary | ICD-10-CM | POA: Diagnosis not present

## 2024-02-16 DIAGNOSIS — E871 Hypo-osmolality and hyponatremia: Secondary | ICD-10-CM | POA: Diagnosis present

## 2024-02-16 DIAGNOSIS — M109 Gout, unspecified: Secondary | ICD-10-CM | POA: Diagnosis present

## 2024-02-16 DIAGNOSIS — Z8249 Family history of ischemic heart disease and other diseases of the circulatory system: Secondary | ICD-10-CM | POA: Diagnosis not present

## 2024-02-16 DIAGNOSIS — Z515 Encounter for palliative care: Secondary | ICD-10-CM

## 2024-02-16 DIAGNOSIS — Z833 Family history of diabetes mellitus: Secondary | ICD-10-CM

## 2024-02-16 DIAGNOSIS — R404 Transient alteration of awareness: Secondary | ICD-10-CM | POA: Diagnosis not present

## 2024-02-16 DIAGNOSIS — N400 Enlarged prostate without lower urinary tract symptoms: Secondary | ICD-10-CM | POA: Diagnosis present

## 2024-02-16 DIAGNOSIS — R369 Urethral discharge, unspecified: Secondary | ICD-10-CM | POA: Diagnosis present

## 2024-02-16 DIAGNOSIS — R531 Weakness: Secondary | ICD-10-CM | POA: Diagnosis not present

## 2024-02-16 DIAGNOSIS — N281 Cyst of kidney, acquired: Secondary | ICD-10-CM | POA: Diagnosis not present

## 2024-02-16 DIAGNOSIS — R52 Pain, unspecified: Secondary | ICD-10-CM | POA: Diagnosis not present

## 2024-02-16 DIAGNOSIS — E119 Type 2 diabetes mellitus without complications: Secondary | ICD-10-CM | POA: Diagnosis not present

## 2024-02-16 DIAGNOSIS — N1832 Chronic kidney disease, stage 3b: Secondary | ICD-10-CM | POA: Diagnosis present

## 2024-02-16 DIAGNOSIS — C61 Malignant neoplasm of prostate: Secondary | ICD-10-CM | POA: Diagnosis not present

## 2024-02-16 DIAGNOSIS — R2689 Other abnormalities of gait and mobility: Secondary | ICD-10-CM | POA: Diagnosis not present

## 2024-02-16 DIAGNOSIS — R4182 Altered mental status, unspecified: Secondary | ICD-10-CM | POA: Diagnosis not present

## 2024-02-16 LAB — CBC WITH DIFFERENTIAL/PLATELET
Abs Immature Granulocytes: 0.09 10*3/uL — ABNORMAL HIGH (ref 0.00–0.07)
Basophils Absolute: 0 10*3/uL (ref 0.0–0.1)
Basophils Relative: 0 %
Eosinophils Absolute: 0.1 10*3/uL (ref 0.0–0.5)
Eosinophils Relative: 1 %
HCT: 30.5 % — ABNORMAL LOW (ref 39.0–52.0)
Hemoglobin: 10.6 g/dL — ABNORMAL LOW (ref 13.0–17.0)
Immature Granulocytes: 1 %
Lymphocytes Relative: 6 %
Lymphs Abs: 0.9 10*3/uL (ref 0.7–4.0)
MCH: 34.4 pg — ABNORMAL HIGH (ref 26.0–34.0)
MCHC: 34.8 g/dL (ref 30.0–36.0)
MCV: 99 fL (ref 80.0–100.0)
Monocytes Absolute: 0.6 10*3/uL (ref 0.1–1.0)
Monocytes Relative: 4 %
Neutro Abs: 13.4 10*3/uL — ABNORMAL HIGH (ref 1.7–7.7)
Neutrophils Relative %: 88 %
Platelets: 226 10*3/uL (ref 150–400)
RBC: 3.08 MIL/uL — ABNORMAL LOW (ref 4.22–5.81)
RDW: 14.1 % (ref 11.5–15.5)
WBC: 15.1 10*3/uL — ABNORMAL HIGH (ref 4.0–10.5)
nRBC: 0 % (ref 0.0–0.2)

## 2024-02-16 LAB — CBC
HCT: 29.6 % — ABNORMAL LOW (ref 39.0–52.0)
Hemoglobin: 10.2 g/dL — ABNORMAL LOW (ref 13.0–17.0)
MCH: 34.7 pg — ABNORMAL HIGH (ref 26.0–34.0)
MCHC: 34.5 g/dL (ref 30.0–36.0)
MCV: 100.7 fL — ABNORMAL HIGH (ref 80.0–100.0)
Platelets: 197 10*3/uL (ref 150–400)
RBC: 2.94 MIL/uL — ABNORMAL LOW (ref 4.22–5.81)
RDW: 14.4 % (ref 11.5–15.5)
WBC: 12.9 10*3/uL — ABNORMAL HIGH (ref 4.0–10.5)
nRBC: 0 % (ref 0.0–0.2)

## 2024-02-16 LAB — COMPREHENSIVE METABOLIC PANEL WITH GFR
ALT: 19 U/L (ref 0–44)
AST: 22 U/L (ref 15–41)
Albumin: 2.2 g/dL — ABNORMAL LOW (ref 3.5–5.0)
Alkaline Phosphatase: 66 U/L (ref 38–126)
Anion gap: 13 (ref 5–15)
BUN: 120 mg/dL — ABNORMAL HIGH (ref 8–23)
CO2: 17 mmol/L — ABNORMAL LOW (ref 22–32)
Calcium: 8.8 mg/dL — ABNORMAL LOW (ref 8.9–10.3)
Chloride: 96 mmol/L — ABNORMAL LOW (ref 98–111)
Creatinine, Ser: 2.76 mg/dL — ABNORMAL HIGH (ref 0.61–1.24)
GFR, Estimated: 21 mL/min — ABNORMAL LOW (ref >60)
Glucose, Bld: 105 mg/dL — ABNORMAL HIGH (ref 70–99)
Potassium: 4.7 mmol/L (ref 3.5–5.1)
Sodium: 126 mmol/L — ABNORMAL LOW (ref 135–145)
Total Bilirubin: 0.7 mg/dL (ref 0.0–1.2)
Total Protein: 6.2 g/dL — ABNORMAL LOW (ref 6.5–8.1)

## 2024-02-16 LAB — URINALYSIS, W/ REFLEX TO CULTURE (INFECTION SUSPECTED)
Bilirubin Urine: NEGATIVE
Glucose, UA: NEGATIVE mg/dL
Ketones, ur: NEGATIVE mg/dL
Nitrite: NEGATIVE
Protein, ur: 100 mg/dL — AB
RBC / HPF: >50 RBC/hpf (ref 0–5)
Specific Gravity, Urine: 1.012 (ref 1.005–1.030)
WBC, UA: >50 WBC/hpf (ref 0–5)
pH: 5 (ref 5.0–8.0)

## 2024-02-16 LAB — PROTIME-INR
INR: 1.2 (ref 0.8–1.2)
Prothrombin Time: 15 s (ref 11.4–15.2)

## 2024-02-16 LAB — I-STAT CG4 LACTIC ACID, ED
Lactic Acid, Venous: 0.7 mmol/L (ref 0.5–1.9)
Lactic Acid, Venous: 1 mmol/L (ref 0.5–1.9)

## 2024-02-16 LAB — CREATININE, SERUM
Creatinine, Ser: 2.83 mg/dL — ABNORMAL HIGH (ref 0.61–1.24)
GFR, Estimated: 20 mL/min — ABNORMAL LOW (ref >60)

## 2024-02-16 LAB — MAGNESIUM: Magnesium: 3.7 mg/dL — ABNORMAL HIGH (ref 1.7–2.4)

## 2024-02-16 MED ORDER — SODIUM CHLORIDE 0.9 % IV BOLUS
1000.0000 mL | Freq: Once | INTRAVENOUS | Status: AC
  Administered 2024-02-16: 1000 mL via INTRAVENOUS

## 2024-02-16 MED ORDER — CARVEDILOL 12.5 MG PO TABS
12.5000 mg | ORAL_TABLET | Freq: Two times a day (BID) | ORAL | Status: DC
  Administered 2024-02-16 – 2024-02-17 (×2): 12.5 mg via ORAL
  Filled 2024-02-16 (×2): qty 1

## 2024-02-16 MED ORDER — MIRTAZAPINE 15 MG PO TABS
7.5000 mg | ORAL_TABLET | Freq: Every day | ORAL | Status: DC
  Administered 2024-02-17 – 2024-02-18 (×2): 7.5 mg via ORAL
  Filled 2024-02-16 (×3): qty 1

## 2024-02-16 MED ORDER — VANCOMYCIN HCL IN DEXTROSE 1-5 GM/200ML-% IV SOLN
1000.0000 mg | Freq: Once | INTRAVENOUS | Status: AC
  Administered 2024-02-16: 1000 mg via INTRAVENOUS
  Filled 2024-02-16: qty 200

## 2024-02-16 MED ORDER — POLYETHYLENE GLYCOL 3350 17 G PO PACK
17.0000 g | PACK | Freq: Every day | ORAL | Status: DC
  Administered 2024-02-16: 17 g via ORAL
  Filled 2024-02-16: qty 1

## 2024-02-16 MED ORDER — SODIUM CHLORIDE 0.9 % IV SOLN
2.0000 g | Freq: Once | INTRAVENOUS | Status: AC
  Administered 2024-02-16: 2 g via INTRAVENOUS
  Filled 2024-02-16: qty 12.5

## 2024-02-16 MED ORDER — TAMSULOSIN HCL 0.4 MG PO CAPS
0.4000 mg | ORAL_CAPSULE | Freq: Every day | ORAL | Status: DC
  Administered 2024-02-16 – 2024-02-19 (×4): 0.4 mg via ORAL
  Filled 2024-02-16 (×4): qty 1

## 2024-02-16 MED ORDER — OXYCODONE-ACETAMINOPHEN 5-325 MG PO TABS
1.0000 | ORAL_TABLET | Freq: Once | ORAL | Status: AC
  Administered 2024-02-16: 1 via ORAL
  Filled 2024-02-16: qty 1

## 2024-02-16 MED ORDER — LACTATED RINGERS IV BOLUS (SEPSIS)
500.0000 mL | Freq: Once | INTRAVENOUS | Status: AC
  Administered 2024-02-16: 500 mL via INTRAVENOUS

## 2024-02-16 MED ORDER — ACETAMINOPHEN 500 MG PO TABS
1000.0000 mg | ORAL_TABLET | Freq: Three times a day (TID) | ORAL | Status: DC | PRN
  Administered 2024-02-17 (×2): 1000 mg via ORAL
  Filled 2024-02-16 (×2): qty 2

## 2024-02-16 MED ORDER — GABAPENTIN 100 MG PO CAPS
100.0000 mg | ORAL_CAPSULE | Freq: Two times a day (BID) | ORAL | Status: DC
  Administered 2024-02-17 – 2024-02-19 (×5): 100 mg via ORAL
  Filled 2024-02-16 (×6): qty 1

## 2024-02-16 MED ORDER — CHLORHEXIDINE GLUCONATE CLOTH 2 % EX PADS
6.0000 | MEDICATED_PAD | Freq: Every day | CUTANEOUS | Status: DC
  Administered 2024-02-16 – 2024-02-17 (×2): 6 via TOPICAL

## 2024-02-16 MED ORDER — SENNOSIDES-DOCUSATE SODIUM 8.6-50 MG PO TABS
1.0000 | ORAL_TABLET | Freq: Every day | ORAL | Status: DC
  Administered 2024-02-16 – 2024-02-18 (×2): 1 via ORAL
  Filled 2024-02-16 (×4): qty 1

## 2024-02-16 MED ORDER — ATORVASTATIN CALCIUM 40 MG PO TABS
40.0000 mg | ORAL_TABLET | Freq: Every day | ORAL | Status: DC
  Administered 2024-02-16: 40 mg via ORAL
  Filled 2024-02-16: qty 1

## 2024-02-16 MED ORDER — ENOXAPARIN SODIUM 30 MG/0.3ML IJ SOSY
30.0000 mg | PREFILLED_SYRINGE | INTRAMUSCULAR | Status: DC
Start: 2024-02-16 — End: 2024-02-17
  Administered 2024-02-16 – 2024-02-17 (×2): 30 mg via SUBCUTANEOUS
  Filled 2024-02-16 (×2): qty 0.3

## 2024-02-16 MED ORDER — LISINOPRIL 20 MG PO TABS
20.0000 mg | ORAL_TABLET | Freq: Every day | ORAL | Status: DC
  Administered 2024-02-16 – 2024-02-18 (×3): 20 mg via ORAL
  Filled 2024-02-16 (×4): qty 1

## 2024-02-16 MED ORDER — PRIMIDONE 50 MG PO TABS
25.0000 mg | ORAL_TABLET | Freq: Every day | ORAL | Status: DC
  Administered 2024-02-16 – 2024-02-18 (×3): 25 mg via ORAL
  Filled 2024-02-16 (×4): qty 0.5

## 2024-02-16 MED ORDER — LACTATED RINGERS IV SOLN: INTRAVENOUS | Status: DC

## 2024-02-16 NOTE — ED Provider Notes (Signed)
 [facility]MENT AT [facility] Provider Note   CSN: [id number] Arrival date & time: 02/16/24  0245     History  Chief Complaint  Patient presents with   Abnormal Lab    [name] [name] is a 89 y.o. male.   Abnormal Lab Patient presents for abnormal lab result.  Medical history includes HTN, CAD, GERD, CKD, DM, prostate cancer, anemia.  He was admitted 2 weeks ago for syncopal episode.  He was discharged to skilled nursing facility.  He was sent in today by EMS from nursing facility for a abnormal lab result from Friday.  Nursing facility did not specify which lab was abnormal.  Patient, himself, states that he has pain all over.  He does not specify any particular areas of discomfort.     Home Medications Prior to Admission medications   Medication Sig Start Date End Date Taking? Authorizing Provider  acetaminophen  (TYLENOL ) 500 MG tablet Take 2 tablets (1,000 mg total) by mouth 3 (three) times daily as needed for mild pain or moderate pain. 06/06/20  Yes Donnie Galea, MD  allopurinol  (ZYLOPRIM ) 300 MG tablet Take 1 tablet (300 mg total) by mouth daily. 05/14/23  Yes Donnie Galea, MD  atorvastatin  (LIPITOR) 40 MG tablet TAKE 1 TABLET DAILY 05/07/23  Yes Donnie Galea, MD  bisacodyl  5 MG EC tablet Take 5 mg by mouth daily.   Yes [provider]  carvedilol  (COREG ) 12.5 MG tablet Take 12.5 mg by mouth 2 (two) times daily with a meal.   Yes [provider]  doxycycline  (VIBRAMYCIN ) 100 MG capsule Take 100 mg by mouth 2 (two) times daily.   Yes [provider]  gabapentin  (NEURONTIN ) 100 MG capsule Take 1 capsule (100 mg total) by mouth 2 (two) times daily. 02/02/24  Yes Veronica Gordon, MD  lisinopril  (ZESTRIL ) 20 MG tablet Take 1 tablet (20 mg total) by mouth daily. 09/08/23  Yes Donnie Galea, MD  melatonin 3 MG TABS tablet Take 1 tablet (3 mg total) by mouth at bedtime. 05/17/20  Yes Love, Renay Carota, PA-C   mirtazapine  (REMERON ) 7.5 MG tablet TAKE 1 TABLET(7.5 MG) BY MOUTH AT BEDTIME. STOP TRAZODONE  WITH MIRTAZAPINE  USE 12/09/23  Yes Donnie Galea, MD  mupirocin ointment (BACTROBAN) 2 % Apply 1 Application topically 2 (two) times daily. Apply topically twice a day to the urethral opening of the penis and leave it open to air 02/14/24 02/24/24 Yes [provider]  omeprazole  (PRILOSEC) 40 MG capsule TAKE 1 CAPSULE DAILY AS NEEDED Patient taking differently: Take 40 mg by mouth daily as needed (heartburn, acid reflux, indigestion). 04/04/23  Yes Donnie Galea, MD  polyethylene glycol (MIRALAX  / GLYCOLAX ) 17 g packet Take 17 g by mouth daily. Patient taking differently: Take 17 g by mouth 2 (two) times daily. 02/03/24  Yes Ezenduka, Nkeiruka J, MD  primidone  (MYSOLINE ) 50 MG tablet TAKE ONE-HALF (1/2) TABLET DAILY 05/07/23  Yes Donnie Galea, MD  senna-docusate (SENOKOT-S) 8.6-50 MG tablet Take 1-2 tablets by mouth daily. Patient taking differently: Take 2 tablets by mouth 2 (two) times daily. 06/22/21  Yes Donnie Galea, MD  silodosin  (RAPAFLO ) 8 MG CAPS capsule TAKE 1 CAPSULE AT BEDTIME 11/06/23  Yes McKenzie, Arden Beck, MD  sodium chloride  (OCEAN) 0.65 % SOLN nasal spray Place 1 spray into both nostrils as needed for congestion (for dried blood in nasal cavity). 02/01/24  Yes Ezenduka, Nkeiruka J, MD  traMADol  HCl 25 MG  TABS Take 25 mg by mouth every 8 (eight) hours as needed (for pain).   Yes [provider]      Allergies    Patient has no known allergies.    Review of Systems   Review of Systems  Musculoskeletal:  Positive for arthralgias and myalgias.  All other systems reviewed and are negative.   Physical Exam Updated Vital Signs BP 108/61   Pulse (!) 58   Temp 98.2 F (36.8 C) (Oral)   Resp 17   Ht 5\' 11"  (1.803 m)   Wt 84 kg   SpO2 100%   BMI 25.83 kg/m  Physical Exam Vitals and nursing note reviewed. Exam conducted with a chaperone present.   Constitutional:      General: He is not in acute distress.    Appearance: Normal appearance. He is well-developed. He is not ill-appearing, toxic-appearing or diaphoretic.  HENT:     Head: Normocephalic and atraumatic.     Right Ear: External ear normal.     Left Ear: External ear normal.     Nose: Nose normal.     Mouth/Throat:     Mouth: Mucous membranes are moist.  Eyes:     Extraocular Movements: Extraocular movements intact.     Conjunctiva/sclera: Conjunctivae normal.  Cardiovascular:     Rate and Rhythm: Normal rate and regular rhythm.  Pulmonary:     Effort: Pulmonary effort is normal. No respiratory distress.     Breath sounds: Normal breath sounds.  Chest:     Chest wall: No tenderness.  Abdominal:     General: There is distension.     Palpations: Abdomen is soft.     Tenderness: There is no abdominal tenderness.  Genitourinary:    Penis: Discharge and swelling present.      Comments: Traumatic hypospadias from indwelling Foley catheter with purulent drainage from meatus. Musculoskeletal:        General: No swelling.     Cervical back: Neck supple.  Skin:    General: Skin is warm and dry.     Coloration: Skin is not jaundiced or pale.  Neurological:     General: No focal deficit present.     Mental Status: He is alert and oriented to person, place, and time.  Psychiatric:        Mood and Affect: Mood normal.        Behavior: Behavior normal.     ED Results / Procedures / Treatments   Labs (all labs ordered are listed, but only abnormal results are displayed) Labs Reviewed  URINALYSIS, W/ REFLEX TO CULTURE (INFECTION SUSPECTED) - Abnormal; Notable for the following components:      Result Value   APPearance CLOUDY (*)    Hgb urine dipstick LARGE (*)    Protein, ur 100 (*)    Leukocytes,Ua LARGE (*)    Bacteria, UA MANY (*)    All other components within normal limits  CBC WITH DIFFERENTIAL/PLATELET - Abnormal; Notable for the following components:    WBC 15.1 (*)    RBC 3.08 (*)    Hemoglobin 10.6 (*)    HCT 30.5 (*)    MCH 34.4 (*)    Neutro Abs 13.4 (*)    Abs Immature Granulocytes 0.09 (*)    All other components within normal limits  COMPREHENSIVE METABOLIC PANEL WITH GFR - Abnormal; Notable for the following components:   Sodium 126 (*)    Chloride 96 (*)    CO2 17 (*)  Glucose, Bld 105 (*)    BUN 120 (*)    Creatinine, Ser 2.76 (*)    Calcium  8.8 (*)    Total Protein 6.2 (*)    Albumin  2.2 (*)    GFR, Estimated 21 (*)    All other components within normal limits  CULTURE, BLOOD (ROUTINE X 2)  CULTURE, BLOOD (ROUTINE X 2)  URINE CULTURE  PROTIME-INR  CBC WITH DIFFERENTIAL/PLATELET  MAGNESIUM   I-STAT CG4 LACTIC ACID, ED  I-STAT CG4 LACTIC ACID, ED    EKG EKG Interpretation Date/Time:  Monday February 16 2024 03:40:28 EDT Ventricular Rate:  59 PR Interval:    QRS Duration:  161 QT Interval:  480 QTC Calculation: 476 R Axis:   -41  Text Interpretation: Atrial fibrillation Ventricular premature complex RBBB and LAFB Inferior infarct, old Confirmed by Iva Mariner [phone]) on 02/16/2024 4:03:11 AM  Radiology DG Chest Port 1 View Result Date: 02/16/2024 CLINICAL DATA:  Chronic chest pain and possible sepsis EXAM: PORTABLE CHEST 1 VIEW COMPARISON:  01/28/2024 FINDINGS: Cardiac shadow is stable. Aortic calcifications and postoperative changes are again noted. Elevation of the right hemidiaphragm is seen. Lungs are well aerated without focal infiltrate. No bony abnormality is noted. IMPRESSION: No active disease. Electronically Signed   By: Violeta Grey M.D.   On: 02/16/2024 03:22    Procedures Procedures    Medications Ordered in ED Medications  oxyCODONE -acetaminophen  (PERCOCET/ROXICET) 5-325 MG per tablet 1 tablet (has no administration in time range)  lactated ringers  bolus 500 mL (0 mLs Intravenous Stopped 02/16/24 0409)  vancomycin  (VANCOCIN ) IVPB 1000 mg/200 mL premix (0 mg Intravenous Stopped 02/16/24 0446)   ceFEPIme (MAXIPIME) 2 g in sodium chloride  0.9 % 100 mL IVPB (0 g Intravenous Stopped 02/16/24 0348)  sodium chloride  0.9 % bolus 1,000 mL (1,000 mLs Intravenous New Bag/Given 02/16/24 0447)    ED Course/ Medical Decision Making/ A&P                                 Medical Decision Making Amount and/or Complexity of Data Reviewed Labs: ordered. Radiology: ordered.  Risk Prescription drug management. Decision regarding hospitalization.   This patient presents to the ED for concern of abnormal lab work at facility, this involves an extensive number of treatment options, and is a complaint that carries with it a high risk of complications and morbidity.  The differential diagnosis includes leukocytosis, anemia, electrolyte derangements   Co morbidities / Chronic conditions that complicate the patient evaluation  HTN, CAD, GERD, CKD, DM, prostate cancer, anemia   Additional history obtained:  Additional history obtained from EMR External records from outside source obtained and reviewed including N/A   Lab Tests:  I Ordered, and personally interpreted labs.  The pertinent results include: Leukocytosis is present.  Anemia has baseline.  CMP notable for AKI, hyponatremia and non-anion gap metabolic acidosis.   Imaging Studies ordered:  I ordered imaging studies including chest x-ray I independently visualized and interpreted imaging which showed no acute findings I agree with the radiologist interpretation   Cardiac Monitoring: / EKG:  The patient was maintained on a cardiac monitor.  I personally viewed and interpreted the cardiac monitored which showed an underlying rhythm of: Atrial fibrillation   Problem List / ED Course / Critical interventions / Medication management  Patient presenting from skilled nursing facility for concern of abnormal lab work.  Further details of his recent lab work are not available  on his arrival.  I did review his nursing facility paperwork.   It does appear that he was started on doxycycline  3 days ago.  Physical exam is notable for purulent drainage from penile meatus.  Per chart review, Foley catheter was placed for urine retention during his recent hospitalization.  This was only about 2 weeks ago.  Percocet was ordered for analgesia.  Vancomycin  and cefepime were ordered.  Workup was initiated.  Patient's lab work shows leukocytosis.  It also appears that he has an AKI with some new hyponatremia.  Additional IV fluids were ordered.  I spoke with urologist on-call, Dr. Sherrine Dolly, who will see the patient in consult.  For now, he recommends leaving Foley catheter in place and minimizing tension on area of meatus wound.  Patient was admitted for further management. I ordered medication including vancomycin  and cefepime for infected penis wound and UTI; IV fluids for hydration and hyponatremia; Percocet for analgesia Reevaluation of the patient after these medicines showed that the patient improved I have reviewed the patients home medicines and have made adjustments as needed   Consultations Obtained:  I requested consultation with the urologist, Dr. Sherrine Dolly,  and discussed lab and imaging findings as well as pertinent plan - they recommend: Leaving Foley catheter in place.  Urology will see in consult.   Social Determinants of Health:  Currently resides in skilled nursing facility         Final Clinical Impression(s) / ED Diagnoses Final diagnoses:  Penile discharge  Failure of outpatient treatment  AKI (acute kidney injury) (HCC)  Hyponatremia  Urinary tract infection with hematuria, site unspecified    Rx / DC Orders ED Discharge Orders     None         Iva Mariner, MD 02/16/24 [phone]

## 2024-02-16 NOTE — Consult Note (Signed)
 Urology Consult Note   Requesting Attending Physician:  Iva Mariner, MD Service Providing Consult: Urology  Consulting Attending: Dr. Cathi Cluster   Reason for Consult:  acquired hypospadias  HPI: [name] [name] is seen in consultation for reasons noted above at the request of Iva Mariner, MD. Patient is a 89 year old presenting to SNF for concern of abnormal labs.  Patient is known to Dr. Claretta Croft and was seen multiple times last year for BPH/urinary retention.  This has been managed with Rapaflo  8mg  daily.  His postvoid residuals have been between 130 and .  PMH significant for HTN, CAD, CKD stage III, urinary retention and prostate cancer-treated with ablation 09/18/2005.  On physical exam-to the ED, he was found to have erosion with some fibrinous exudate on the lower aspect of the urethral meatus.  Catheter appears to have been placed during his 02/02/2024 admission to included urinary retention 821 mL bladder scan.   Patient has a creatinine of 2.7 which is increased from prior he also has hyponatremia to 126.  Patient has a history of prostate cancer for grade group 1 disease treated with cryoablation in 2007.    ------------------  Assessment:  89 y.o. 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  History of prostate cancer, hyponatremia and new AKI.  Recommendations: #chronic foley #urethral erosion -Once creatinine has improved consider void trial while inpatient. - Patient will need ultrasound prior to any attempted void trial continue catheter for the time being. - Urethral eversion shows no evidence of infection.  # AKI with hyponatremia. -Recommend renal ultrasound to confirm if hydronephrosis concern for possible secondary obstruction of the ureters.  # Prostate cancer - Hyponatremia can occur in metastatic prostate cancer patient not been seen by [facility] since 2020 - If there is hydronephrosis on ultrasound recommend  CT    Case and plan discussed with Dr. Cathi Cluster  Past Medical History: Past Medical History:  Diagnosis Date   Acute renal failure (HCC) 12/06/2013   Back pain    occasionally   Basal cell carcinoma (BCC) of antihelix of left ear    Benign essential tremor 2005   takes Primidone  daily   Choledocholithiasis 12/06/2013   Constipation    takes OTC stool softener   Coronary artery disease 2007   s/p CABG, DR Stann Earnest   GERD (gastroesophageal reflux disease)    takes Nexium  daily   Gout 1995   "~ once/yr" (03/17/2014)   History of colon polyps    Hyperlipidemia 12/2005   takes Atorvastatin  daily   Hypertension 1995   takes Lisinopril  and Coreg  daily   possible HCAP (healthcare-associated pneumonia) 12/09/2013   Prostate cancer (HCC) 2007   S/P treatment, followed by Urology prev   Right bundle branch block (RBBB)    stage 3b chronic kidney disease (HCC)    Stenosis of cervical spine    Type II diabetes mellitus (HCC)    no meds;diet and exercise controlled    Wears dentures    Wears glasses     Past Surgical History:  Past Surgical History:  Procedure Laterality Date   Adenosine myoview   01/24/2006   Ischemia by EKG, Cath   CATARACT EXTRACTION W/ INTRAOCULAR LENS IMPLANT Left 10/2013   CHOLECYSTECTOMY N/A 12/06/2013   Procedure: Diagnostic Laparoscopy for  drainage Intrabdominal abcess;  Surgeon: Enid Harry, MD;  Location: [facility] OR;  Service: General;  Laterality: N/A;   CHOLECYSTECTOMY N/A 03/17/2014   Procedure: LAPAROSCOPIC CHOLECYSTECTOMY ;  Surgeon: Enid Harry, MD;  Location:  [facility] OR;  Service: General;  Laterality: N/A;   COLONOSCOPY     CORONARY ARTERY BYPASS GRAFT  2007   CABGX 4   CYSTOSCOPY  02/15/2004   Mod BPH, moder tribec ?   ERCP N/A 12/06/2013   Procedure: ENDOSCOPIC RETROGRADE CHOLANGIOPANCREATOGRAPHY (ERCP);  Surgeon: Janel Medford, MD;  Location: [facility] OR;  Service: Endoscopy;  Laterality: N/A;   LAPAROSCOPIC CHOLECYSTECTOMY  03/17/2014   LUMBAR  FUSION  02/2012   lumbar spine for spinal stenosis   POSTERIOR CERVICAL FUSION/FORAMINOTOMY N/A 05/01/2020   Procedure: Posterior cervical fusion with lateral mass fixation - Cervical three - Cervical six, laminectomy Cervical three-six;  Surgeon: Isadora Mar, MD;  Location: [facility] OR;  Service: Neurosurgery;  Laterality: N/A;   PROSTATE CRYOABLATION  09/18/2005   prostate CA    Medication: No current facility-administered medications for this encounter.   Current Outpatient Medications  Medication Sig Dispense Refill   acetaminophen  (TYLENOL ) 500 MG tablet Take 2 tablets (1,000 mg total) by mouth 3 (three) times daily as needed for mild pain or moderate pain.     allopurinol  (ZYLOPRIM ) 300 MG tablet Take 1 tablet (300 mg total) by mouth daily. 90 tablet 3   atorvastatin  (LIPITOR) 40 MG tablet TAKE 1 TABLET DAILY 90 tablet 3   bisacodyl  5 MG EC tablet Take 5 mg by mouth daily.     carvedilol  (COREG ) 12.5 MG tablet Take 12.5 mg by mouth 2 (two) times daily with a meal.     doxycycline  (VIBRAMYCIN ) 100 MG capsule Take 100 mg by mouth 2 (two) times daily.     gabapentin  (NEURONTIN ) 100 MG capsule Take 1 capsule (100 mg total) by mouth 2 (two) times daily.     lisinopril  (ZESTRIL ) 20 MG tablet Take 1 tablet (20 mg total) by mouth daily. 90 tablet 3   melatonin 3 MG TABS tablet Take 1 tablet (3 mg total) by mouth at bedtime. 30 tablet 0   mirtazapine  (REMERON ) 7.5 MG tablet TAKE 1 TABLET(7.5 MG) BY MOUTH AT BEDTIME. STOP TRAZODONE  WITH MIRTAZAPINE  USE 90 tablet 1   mupirocin ointment (BACTROBAN) 2 % Apply 1 Application topically 2 (two) times daily. Apply topically twice a day to the urethral opening of the penis and leave it open to air     omeprazole  (PRILOSEC) 40 MG capsule TAKE 1 CAPSULE DAILY AS NEEDED (Patient taking differently: Take 40 mg by mouth daily as needed (heartburn, acid reflux, indigestion).) 90 capsule 3   polyethylene glycol (MIRALAX  / GLYCOLAX ) 17 g packet Take 17 g by mouth  daily. (Patient taking differently: Take 17 g by mouth 2 (two) times daily.)     primidone  (MYSOLINE ) 50 MG tablet TAKE ONE-HALF (1/2) TABLET DAILY 45 tablet 3   senna-docusate (SENOKOT-S) 8.6-50 MG tablet Take 1-2 tablets by mouth daily. (Patient taking differently: Take 2 tablets by mouth 2 (two) times daily.)     silodosin  (RAPAFLO ) 8 MG CAPS capsule TAKE 1 CAPSULE AT BEDTIME 90 capsule 3   sodium chloride  (OCEAN) 0.65 % SOLN nasal spray Place 1 spray into both nostrils as needed for congestion (for dried blood in nasal cavity).     traMADol  HCl 25 MG TABS Take 25 mg by mouth every 8 (eight) hours as needed (for pain).      Allergies: No Known Allergies  Social History: Social History   Tobacco Use   Smoking status: Never   Smokeless tobacco: Never  Vaping Use   Vaping status: Never Used  Substance Use  Topics   Alcohol use: No   Drug use: No    Family History Family History  Problem Relation Age of Onset   Heart disease Mother    Cancer Mother    Cancer Sister    Diabetes Sister    Cancer Brother        prostate   Prostate cancer Brother    Hypothyroidism Brother    Depression Neg Hx    Alcohol abuse Neg Hx    Drug abuse Neg Hx    Stroke Neg Hx    Colon cancer Neg Hx     ROS   Objective   Vital signs in last 24 hours: BP (!) 112/57   Pulse (!) 53   Temp 98.9 F (37.2 C)   Resp 13   Ht 5\' 11"  (1.803 m)   Wt 84 kg   SpO2 100%   BMI 25.83 kg/m   Physical Exam General: Mildly oriented does not compensate well. Pulmonary: Normal work of breathing Cardiovascular: no cyanosis Abdomen: Soft, NTTP, nondistended GU: Some ventral erosion noted on exam, would consider this dry gangrene that does not require debridement.  No evidence of Fournier's gangrene in the scrotum medial thighs or mons pubis or penis.  No fluctuance, no induration no crepitus.   Most Recent Labs: Lab Results  Component Value Date   WBC 15.1 (H) 02/16/2024   HGB 10.6 (L) 02/16/2024    HCT 30.5 (L) 02/16/2024   PLT 226 02/16/2024    Lab Results  Component Value Date   NA 126 (L) 02/16/2024   K 4.7 02/16/2024   CL 96 (L) 02/16/2024   CO2 17 (L) 02/16/2024   BUN 120 (H) 02/16/2024   CREATININE 2.76 (H) 02/16/2024   CALCIUM  8.8 (L) 02/16/2024   MG 1.8 12/30/2022   PHOS 2.4 (L) 12/28/2022    Lab Results  Component Value Date   INR 1.2 02/16/2024     Urine Culture: @[id number] (laburin,org,[id number],[id number])@   IMAGING: DG Chest Port 1 View Result Date: 02/16/2024 CLINICAL DATA:  Chronic chest pain and possible sepsis EXAM: PORTABLE CHEST 1 VIEW COMPARISON:  01/28/2024 FINDINGS: Cardiac shadow is stable. Aortic calcifications and postoperative changes are again noted. Elevation of the right hemidiaphragm is seen. Lungs are well aerated without focal infiltrate. No bony abnormality is noted. IMPRESSION: No active disease. Electronically Signed   By: Violeta Grey M.D.   On: 02/16/2024 03:22    ------  Alla Ar, NP Pager: [phone]   Please contact the urology consult pager with any further questions/concerns.

## 2024-02-16 NOTE — H&P (Signed)
 History and Physical    Patient: [name] [name] ZOX:[id number] DOB: 12/11/1928 DOA: 02/16/2024 DOS: the patient was seen and examined on 02/16/2024 PCP: Donnie Galea, MD  Patient coming from: SNF  Chief Complaint:  Chief Complaint  Patient presents with   Abnormal Lab   HPI: [name] [name] is a 89 y.o. male with medical history significant of chronic back pain, HTN, HLD, CAD, prostate cancer, CKD stage III, BPH, urinary retention, essential tremor, and recent discharge to SNF after GLF on 5/21 iso orthostatic hypotension and bradycardia (Coreg  reduced) p/w reported abnormal lab and found to have purulent drainage from foley placed last admission.  Pt and daughter, [name], at bedside unable to provide medical history. From what I can gather by chart review, pt "sent in today by EMS from nursing facility for a abnormal lab result from Friday. Nursing facility did not specify which lab was abnormal. Patient, himself, states that he has pain all over. He does not specify any particular areas of discomfort."  In the ED, pt bradycardic and hypotensive. Labs notable for Na 126 (131  on 5/26), Cr 2.76 (1.67 on 5/26), WBC 15.1-->12.9 (s/p 1.5L in ED). UA positive for LE and many bacteria, but neg for nitrite. Urine culture pending. Pt admitted to medicine for ongoing w/u.  Review of Systems: As mentioned in the history of present illness. All other systems reviewed and are negative. Past Medical History:  Diagnosis Date   Acute renal failure (HCC) 12/06/2013   Back pain    occasionally   Basal cell carcinoma (BCC) of antihelix of left ear    Benign essential tremor 2005   takes Primidone  daily   Choledocholithiasis 12/06/2013   Constipation    takes OTC stool softener   Coronary artery disease 2007   s/p CABG, DR Stann Earnest   GERD (gastroesophageal reflux disease)    takes Nexium  daily   Gout 1995   "~ once/yr" (03/17/2014)   History of colon polyps    Hyperlipidemia 12/2005   takes  Atorvastatin  daily   Hypertension 1995   takes Lisinopril  and Coreg  daily   possible HCAP (healthcare-associated pneumonia) 12/09/2013   Prostate cancer (HCC) 2007   S/P treatment, followed by Urology prev   Right bundle branch block (RBBB)    stage 3b chronic kidney disease (HCC)    Stenosis of cervical spine    Type II diabetes mellitus (HCC)    no meds;diet and exercise controlled    Wears dentures    Wears glasses    Past Surgical History:  Procedure Laterality Date   Adenosine myoview   01/24/2006   Ischemia by EKG, Cath   CATARACT EXTRACTION W/ INTRAOCULAR LENS IMPLANT Left 10/2013   CHOLECYSTECTOMY N/A 12/06/2013   Procedure: Diagnostic Laparoscopy for  drainage Intrabdominal abcess;  Surgeon: Enid Harry, MD;  Location: [facility] OR;  Service: General;  Laterality: N/A;   CHOLECYSTECTOMY N/A 03/17/2014   Procedure: LAPAROSCOPIC CHOLECYSTECTOMY ;  Surgeon: Enid Harry, MD;  Location: [facility] OR;  Service: General;  Laterality: N/A;   COLONOSCOPY     CORONARY ARTERY BYPASS GRAFT  2007   CABGX 4   CYSTOSCOPY  02/15/2004   Mod BPH, moder tribec ?   ERCP N/A 12/06/2013   Procedure: ENDOSCOPIC RETROGRADE CHOLANGIOPANCREATOGRAPHY (ERCP);  Surgeon: Janel Medford, MD;  Location: [facility] OR;  Service: Endoscopy;  Laterality: N/A;   LAPAROSCOPIC CHOLECYSTECTOMY  03/17/2014   LUMBAR FUSION  02/2012   lumbar spine for spinal stenosis   POSTERIOR CERVICAL FUSION/FORAMINOTOMY  N/A 05/01/2020   Procedure: Posterior cervical fusion with lateral mass fixation - Cervical three - Cervical six, laminectomy Cervical three-six;  Surgeon: Isadora Mar, MD;  Location: [facility] OR;  Service: Neurosurgery;  Laterality: N/A;   PROSTATE CRYOABLATION  09/18/2005   prostate CA   Social History:  reports that he has never smoked. He has never used smokeless tobacco. He reports that he does not drink alcohol and does not use drugs.  No Known Allergies  Family History  Problem Relation Age of Onset   Heart disease  Mother    Cancer Mother    Cancer Sister    Diabetes Sister    Cancer Brother        prostate   Prostate cancer Brother    Hypothyroidism Brother    Depression Neg Hx    Alcohol abuse Neg Hx    Drug abuse Neg Hx    Stroke Neg Hx    Colon cancer Neg Hx     Prior to Admission medications   Medication Sig Start Date End Date Taking? Authorizing Provider  acetaminophen  (TYLENOL ) 500 MG tablet Take 2 tablets (1,000 mg total) by mouth 3 (three) times daily as needed for mild pain or moderate pain. 06/06/20  Yes Donnie Galea, MD  allopurinol  (ZYLOPRIM ) 300 MG tablet Take 1 tablet (300 mg total) by mouth daily. 05/14/23  Yes Donnie Galea, MD  atorvastatin  (LIPITOR) 40 MG tablet TAKE 1 TABLET DAILY 05/07/23  Yes Donnie Galea, MD  bisacodyl  5 MG EC tablet Take 5 mg by mouth daily.   Yes [provider]  carvedilol  (COREG ) 12.5 MG tablet Take 12.5 mg by mouth 2 (two) times daily with a meal.   Yes [provider]  doxycycline  (VIBRAMYCIN ) 100 MG capsule Take 100 mg by mouth 2 (two) times daily.   Yes [provider]  gabapentin  (NEURONTIN ) 100 MG capsule Take 1 capsule (100 mg total) by mouth 2 (two) times daily. 02/02/24  Yes Ezenduka, Nkeiruka J, MD  lisinopril  (ZESTRIL ) 20 MG tablet Take 1 tablet (20 mg total) by mouth daily. 09/08/23  Yes Donnie Galea, MD  melatonin 3 MG TABS tablet Take 1 tablet (3 mg total) by mouth at bedtime. 05/17/20  Yes Love, Renay Carota, PA-C  mirtazapine  (REMERON ) 7.5 MG tablet TAKE 1 TABLET(7.5 MG) BY MOUTH AT BEDTIME. STOP TRAZODONE  WITH MIRTAZAPINE  USE 12/09/23  Yes Donnie Galea, MD  mupirocin ointment (BACTROBAN) 2 % Apply 1 Application topically 2 (two) times daily. Apply topically twice a day to the urethral opening of the penis and leave it open to air 02/14/24 02/24/24 Yes [provider]  omeprazole  (PRILOSEC) 40 MG capsule TAKE 1 CAPSULE DAILY AS NEEDED Patient taking differently: Take 40 mg by mouth daily as needed  (heartburn, acid reflux, indigestion). 04/04/23  Yes Donnie Galea, MD  polyethylene glycol (MIRALAX  / GLYCOLAX ) 17 g packet Take 17 g by mouth daily. Patient taking differently: Take 17 g by mouth 2 (two) times daily. 02/03/24  Yes Ezenduka, Nkeiruka J, MD  primidone  (MYSOLINE ) 50 MG tablet TAKE ONE-HALF (1/2) TABLET DAILY 05/07/23  Yes Donnie Galea, MD  senna-docusate (SENOKOT-S) 8.6-50 MG tablet Take 1-2 tablets by mouth daily. Patient taking differently: Take 2 tablets by mouth 2 (two) times daily. 06/22/21  Yes Donnie Galea, MD  silodosin  (RAPAFLO ) 8 MG CAPS capsule TAKE 1 CAPSULE AT BEDTIME 11/06/23  Yes McKenzie, Arden Beck, MD  sodium chloride  (OCEAN) 0.65 % SOLN nasal  spray Place 1 spray into both nostrils as needed for congestion (for dried blood in nasal cavity). 02/01/24  Yes Ezenduka, Nkeiruka J, MD  traMADol  HCl 25 MG TABS Take 25 mg by mouth every 8 (eight) hours as needed (for pain).   Yes [provider]    Physical Exam: Vitals:   02/16/24 1000 02/16/24 1037 02/16/24 1045 02/16/24 1159  BP: (!) 102/58  (!) 94/47 (!) 100/48  Pulse: (!) 55  (!) 54 (!) 56  Resp: 18   19  Temp:  (!) 97.3 F (36.3 C)  98.2 F (36.8 C)  TempSrc:  Oral    SpO2: 99%  99% 99%  Weight:      Height:       General: Alert, oriented x3, resting comfortably in no acute distress Respiratory: Lungs clear to auscultation bilaterally with normal respiratory effort; no w/r/r Cardiovascular: Regular rate and rhythm w/o m/r/g   Data Reviewed:  Lab Results  Component Value Date   WBC 12.9 (H) 02/16/2024   HGB 10.2 (L) 02/16/2024   HCT 29.6 (L) 02/16/2024   MCV 100.7 (H) 02/16/2024   PLT 197 02/16/2024   Lab Results  Component Value Date   GLUCOSE 105 (H) 02/16/2024   CALCIUM  8.8 (L) 02/16/2024   NA 126 (L) 02/16/2024   K 4.7 02/16/2024   CO2 17 (L) 02/16/2024   CL 96 (L) 02/16/2024   BUN 120 (H) 02/16/2024   CREATININE 2.83 (H) 02/16/2024   Lab Results  Component Value  Date   ALT 19 02/16/2024   AST 22 02/16/2024   ALKPHOS 66 02/16/2024   BILITOT 0.7 02/16/2024   Lab Results  Component Value Date   INR 1.2 02/16/2024   INR 1.0 05/01/2020   INR 1.06 03/10/2014    Radiology: [facility] Chest Port 1 View Result Date: 02/16/2024 CLINICAL DATA:  Chronic chest pain and possible sepsis EXAM: PORTABLE CHEST 1 VIEW COMPARISON:  01/28/2024 FINDINGS: Cardiac shadow is stable. Aortic calcifications and postoperative changes are again noted. Elevation of the right hemidiaphragm is seen. Lungs are well aerated without focal infiltrate. No bony abnormality is noted. IMPRESSION: No active disease. Electronically Signed   By: Violeta Grey M.D.   On: 02/16/2024 03:22    Assessment and Plan: 26M h/o chronic back pain, HTN, HLD, CAD, prostate cancer, CKD stage III, BPH, urinary retention, essential tremor, and recent discharge to SNF after GLF on 5/21 iso orthostatic hypotension and bradycardia (Coreg  reduced) p/w reported abnormal lab and found to have purulent drainage from foley placed last admission.  Purulent drainage from foley Presumed CAUTI -Urology consulted; apprec eval/recs -Continue IV vancomycin  and IV cefepime for now -MIVF: LR at 50cc/h for 24h -F/u blood and urine cultures  Physical deconditioning Recent GLF -PTOT consulted; apprec eval/recs  HTN -PTA Coreg  12.5mg  BID and lisinopril  20mg  daily  Constipation -PTA senna/docusate and miralax    Advance Care Planning:   Code Status: Full Code   Consults: Urology  Family Communication: Daughter, [name].  Severity of Illness: The appropriate patient status for this patient is INPATIENT. Inpatient status is judged to be reasonable and necessary in order to provide the required intensity of service to ensure the patient's safety. The patient's presenting symptoms, physical exam findings, and initial radiographic and laboratory data in the context of their chronic comorbidities is felt to place them at high  risk for further clinical deterioration. Furthermore, it is not anticipated that the patient will be medically stable for discharge from the hospital within  2 midnights of admission.   * I certify that at the point of admission it is my clinical judgment that the patient will require inpatient hospital care spanning beyond 2 midnights from the point of admission due to high intensity of service, high risk for further deterioration and high frequency of surveillance required.*   ------- I spent 55 minutes reviewing previous labs/notes, obtaining separate history at the bedside, counseling/discussing the treatment plan outlined above, ordering medications/tests, and performing clinical documentation.  Author: Arne Langdon, MD 02/16/2024 2:53 PM  For on call review [email or web address].

## 2024-02-16 NOTE — ED Notes (Signed)
 MD notified about pts hypotension, requested fluid ordered

## 2024-02-16 NOTE — ED Notes (Signed)
 Pt cleaned, bed and brief changed, call light within reach

## 2024-02-16 NOTE — ED Triage Notes (Addendum)
 Patient BIB [facility] from [facility] for evaluation of abnormal lab. Facility reports patient was seen on Friday and had a telehealth visit earlier today. Per [facility] staff, facility is not sure what lab is abnormal. Per report, patient has a rash in the groin. On evaluation, patient has a oozing wound on the tip of his penis. Patient has a foley catheter in place. Patient complaining of chronic pain in the middle of back. Patient alert and oriented x4.

## 2024-02-16 NOTE — TOC Initial Note (Signed)
 Transition of Care [facility]) - Initial/Assessment Note    Patient Details  Name: [name] [name] MRN: [id number] Date of Birth: [birth year]  Transition of Care [facility]) CM/SW Contact:    Juliane Och, LCSW Phone Number: 02/16/2024, 3:43 PM  Clinical Narrative:                  3:43 PM Per chart review, patient is from [facility]. SNF admissions confirmed patient was STR for approximately two weeks prior to current hospitalization and is able to return upon discharge.   Expected Discharge Plan: Skilled Nursing Facility Barriers to Discharge: Continued Medical Work up, English as a second language teacher, SNF Pending bed offer   Patient Goals and CMS Choice            Expected Discharge Plan and Services In-house Referral: Clinical Social Work   Post Acute Care Choice: Skilled Nursing Facility Living arrangements for the past 2 months: Single Family Home, Skilled Nursing Facility                                      Prior Living Arrangements/Services Living arrangements for the past 2 months: Single Family Home, Skilled Nursing Facility Lives with:: Adult Children, Facility Resident Patient language and need for interpreter reviewed:: Yes              Criminal Activity/Legal Involvement Pertinent to Current Situation/Hospitalization: No - Comment as needed  Activities of Daily Living      Permission Sought/Granted Permission sought to share information with : Family Supports, Oceanographer granted to share information with : No (Contact information on chart)  Share Information with NAME: [name]  Permission granted to share info w AGENCY: SNF  Permission granted to share info w Relationship: Daughter  Permission granted to share info w Contact Information: [phone]  Emotional Assessment       Orientation: : Oriented to Self, Oriented to Place, Oriented to  Time, Oriented to Situation Alcohol / Substance Use: Not  Applicable Psych Involvement: No (comment)  Admission diagnosis:  Penile discharge [R36.9] Hyponatremia [E87.1] AKI (acute kidney injury) (HCC) [N17.9] Foley catheter problem, initial encounter (HCC) [Q03.9XXA] Failure of outpatient treatment [Z78.9] Urinary tract infection with hematuria, site unspecified [N39.0, R31.9] Patient Active Problem List   Diagnosis Date Noted   Foley catheter problem, initial encounter (HCC) 02/16/2024   Syncope 01/28/2024   Cough 09/09/2023   Other fatigue 06/11/2023   Dysuria 01/08/2023   BPH (benign prostatic hyperplasia) 12/28/2022   Failure to thrive in adult 12/28/2022   Skin lesion 08/21/2022   History of prostate cancer 10/10/2021   Acute on chronic urinary retention 10/10/2021   Cystitis 10/10/2021   DNR (do not resuscitate) 06/25/2021   Constipation 07/10/2020   Sleep disturbance 07/10/2020   Abnormality of gait 07/10/2020   Insomnia 06/12/2020   Acute renal failure superimposed on stage 3b chronic kidney disease (HCC)    Hyponatremia    Hypoalbuminemia due to protein-calorie malnutrition (HCC)    Transaminitis    Acute on chronic anemia    Stenosis of cervical spine with myelopathy (HCC) 05/04/2020   S/P cervical spinal fusion 05/01/2020   History of agent Orange exposure 04/05/2020   Paresthesia 01/20/2020   Health care maintenance 12/28/2017   GERD (gastroesophageal reflux disease) 12/28/2017   Medicare annual wellness visit, subsequent 10/30/2014   Advance care planning 10/30/2014   UTI (urinary tract infection) 04/19/2014  Right bundle branch block (RBBB)    Wheezing 12/09/2013   CKD (chronic kidney disease), stage III (HCC) 12/06/2013   Back pain 10/20/2011   Hypothyroidism 03/23/2009   Vitamin D deficiency 03/23/2009   Tremor 12/18/2006   CAD (coronary artery disease) 12/18/2006   Mixed hyperlipidemia 12/08/2005   History of diabetes mellitus 02/07/2005   Gout 09/09/1993   Essential hypertension 09/09/1993   PCP:   Donnie Galea, MD Pharmacy:   [facility] - [place], [place] - [address] [address] [place] [address] Phone: [phone] Fax: [phone]     Social Drivers of Health (SDOH) Social History: SDOH Screenings   Food Insecurity: No Food Insecurity (01/28/2024)  Housing: Low Risk  (01/28/2024)  Transportation Needs: No Transportation Needs (01/28/2024)  Utilities: Not At Risk (01/28/2024)  Depression (PHQ2-9): High Risk (06/10/2023)  Social Connections: Socially Isolated (01/30/2024)  Tobacco Use: Low Risk  (02/16/2024)   SDOH Interventions:     Readmission Risk Interventions     No data to display

## 2024-02-16 NOTE — ED Notes (Signed)
 Called daughter and left voicemail with update

## 2024-02-16 NOTE — ED Notes (Signed)
 Pt given 2 warm blankets to warm him up

## 2024-02-17 DIAGNOSIS — Z7189 Other specified counseling: Secondary | ICD-10-CM

## 2024-02-17 DIAGNOSIS — Z515 Encounter for palliative care: Secondary | ICD-10-CM

## 2024-02-17 LAB — BASIC METABOLIC PANEL WITH GFR
Anion gap: 10 (ref 5–15)
BUN: 114 mg/dL — ABNORMAL HIGH (ref 8–23)
CO2: 16 mmol/L — ABNORMAL LOW (ref 22–32)
Calcium: 7.8 mg/dL — ABNORMAL LOW (ref 8.9–10.3)
Chloride: 99 mmol/L (ref 98–111)
Creatinine, Ser: 2.64 mg/dL — ABNORMAL HIGH (ref 0.61–1.24)
GFR, Estimated: 22 mL/min — ABNORMAL LOW (ref >60)
Glucose, Bld: 107 mg/dL — ABNORMAL HIGH (ref 70–99)
Potassium: 4.4 mmol/L (ref 3.5–5.1)
Sodium: 125 mmol/L — ABNORMAL LOW (ref 135–145)

## 2024-02-17 LAB — CBC
HCT: 27.3 % — ABNORMAL LOW (ref 39.0–52.0)
Hemoglobin: 9.2 g/dL — ABNORMAL LOW (ref 13.0–17.0)
MCH: 33.6 pg (ref 26.0–34.0)
MCHC: 33.7 g/dL (ref 30.0–36.0)
MCV: 99.6 fL (ref 80.0–100.0)
Platelets: 196 10*3/uL (ref 150–400)
RBC: 2.74 MIL/uL — ABNORMAL LOW (ref 4.22–5.81)
RDW: 14.5 % (ref 11.5–15.5)
WBC: 10.9 10*3/uL — ABNORMAL HIGH (ref 4.0–10.5)
nRBC: 0 % (ref 0.0–0.2)

## 2024-02-17 MED ORDER — METHOCARBAMOL 500 MG PO TABS
750.0000 mg | ORAL_TABLET | Freq: Three times a day (TID) | ORAL | Status: DC | PRN
  Administered 2024-02-17: 750 mg via ORAL
  Filled 2024-02-17: qty 2

## 2024-02-17 MED ORDER — SODIUM CHLORIDE 0.9 % IV SOLN
2.0000 g | INTRAVENOUS | Status: DC
  Administered 2024-02-17: 2 g via INTRAVENOUS
  Filled 2024-02-17: qty 12.5

## 2024-02-17 MED ORDER — BIOTENE DRY MOUTH MT LIQD: 15.0000 mL | OROMUCOSAL | Status: DC | PRN

## 2024-02-17 MED ORDER — POLYVINYL ALCOHOL 1.4 % OP SOLN: 1.0000 [drp] | Freq: Four times a day (QID) | OPHTHALMIC | Status: DC | PRN

## 2024-02-17 MED ORDER — HYDROMORPHONE HCL 1 MG/ML IJ SOLN
1.0000 mg | INTRAMUSCULAR | Status: DC
  Administered 2024-02-17 – 2024-02-19 (×8): 1 mg via INTRAVENOUS
  Filled 2024-02-17 (×8): qty 1

## 2024-02-17 MED ORDER — ONDANSETRON 4 MG PO TBDP: 4.0000 mg | ORAL_TABLET | Freq: Four times a day (QID) | ORAL | Status: DC | PRN

## 2024-02-17 MED ORDER — GLYCOPYRROLATE 0.2 MG/ML IJ SOLN: 0.2000 mg | INTRAMUSCULAR | Status: DC | PRN

## 2024-02-17 MED ORDER — GLYCOPYRROLATE 1 MG PO TABS: 1.0000 mg | ORAL_TABLET | ORAL | Status: DC | PRN

## 2024-02-17 MED ORDER — LORAZEPAM 2 MG/ML IJ SOLN
0.5000 mg | INTRAMUSCULAR | Status: DC | PRN
  Administered 2024-02-18: 1 mg via INTRAVENOUS
  Administered 2024-02-19: 0.5 mg via INTRAVENOUS
  Filled 2024-02-17 (×2): qty 1

## 2024-02-17 MED ORDER — HYDROMORPHONE HCL 1 MG/ML IJ SOLN
0.5000 mg | INTRAMUSCULAR | Status: DC | PRN
  Administered 2024-02-18: 1 mg via INTRAVENOUS
  Filled 2024-02-17: qty 1

## 2024-02-17 MED ORDER — SODIUM CHLORIDE 0.9 % IV SOLN: 1.0000 g | INTRAVENOUS | Status: DC

## 2024-02-17 MED ORDER — ONDANSETRON HCL 4 MG/2ML IJ SOLN: 4.0000 mg | Freq: Four times a day (QID) | INTRAMUSCULAR | Status: DC | PRN

## 2024-02-17 MED ORDER — SODIUM CHLORIDE 0.9 % IV SOLN: INTRAVENOUS | Status: DC

## 2024-02-17 NOTE — TOC Progression Note (Addendum)
 Transition of Care [facility]) - Progression Note    Patient Details  Name: [name] [name] MRN: [id number] Date of Birth: 17-Jan-1929  [facility]) CM/SW Contact  Juliane Och, LCSW Phone Number: 02/17/2024, 2:05 PM  Clinical Narrative:     2:05 PM Per chart review, patient's daughter requested Palliative Care consultation and is considering hospice if patient eligible.  4:30 PM Per Palliative Care NP, patient and patient's family expressed interest in residential hospice with [facility]. CSW informed intake coordinator who stated that a RN would evaluate patient at bedside tomorrow morning. Medical team made aware.  Expected Discharge Plan: Skilled Nursing Facility Barriers to Discharge: Continued Medical Work up, English as a second language teacher, SNF Pending bed offer  Expected Discharge Plan and Services In-house Referral: Clinical Social Work   Post Acute Care Choice: Skilled Nursing Facility Living arrangements for the past 2 months: Single Family Home, Skilled Nursing Facility                                       Social Determinants of Health (SDOH) Interventions SDOH Screenings   Food Insecurity: No Food Insecurity (01/28/2024)  Housing: Low Risk  (01/28/2024)  Transportation Needs: No Transportation Needs (01/28/2024)  Utilities: Not At Risk (01/28/2024)  Depression (PHQ2-9): High Risk (06/10/2023)  Social Connections: Socially Isolated (01/30/2024)  Tobacco Use: Low Risk  (02/16/2024)    Readmission Risk Interventions     No data to display

## 2024-02-17 NOTE — Progress Notes (Addendum)
  Progress Note   Patient: [name] [name]:[id number] DOB: Oct 24, 1928 DOA: 02/16/2024     1 DOS: the patient was seen and examined on 02/17/2024   Brief hospital course: "[name] [name] is a 89 y.o. male with medical history significant of chronic back pain, HTN, HLD, CAD, prostate cancer, CKD stage III, BPH, urinary retention, essential tremor, and recent discharge to SNF after GLF on 5/21 iso orthostatic hypotension and bradycardia (Coreg  reduced) p/w reported abnormal lab and found to have purulent drainage from foley placed last admission. " See H&P for full HPI on admission & ED course.   Assessment and Plan:  Purulent drainage from foley Presumed CAUTI --Urology consulted; apprec eval/recs --IV cefepime  --Follow up blood and urine cultures --MIVF: LR at 50cc/h for 24h  Hyponatremia - Na 126 on admission, from 131 on 5/26 --on IV fluids - change LR to NS --Monitor BMP   Physical deconditioning Recent GLF --PTOT consulted; apprec eval/recs  AKI superimposed on CKD stage IIIb Baseline creatinine 1.4-1.6 Presented with Cr 2.76 --Continue IV fluids --Avoid neprhotoxins and hypotension --Monitor BMP   HTN --PTA Coreg  12.5mg  BID and lisinopril  20mg  daily   Constipation --PTA senna/docusate and miralax      Subjective: Pt seen with daughter at bedside this AM.  Pt up in recliner.  He reports ongoing neck pain and tightness since his prior fall (before last admission), and numbness in his hands.  He was supposed to see neurosurgery today, but had to cancel since admitted.  He is not sleeping mostly due to pain.   Pt and daughter requesting palliative consultation and considering hospice if he qualifies.  Pt's main concern is being out of pain.  He's been too weak to do much with therapy since d/c to rehab after last admission.   Physical Exam: Vitals:   02/16/24 1045 02/16/24 1159 02/16/24 1600 02/17/24 0049  BP: (!) 94/47 (!) 100/48 (!) 161/142 (!) 124/58   Pulse: (!) 54 (!) 56 (!) 57 64  Resp:  19 17 16   Temp:  98.2 F (36.8 C) (!) 97.3 F (36.3 C) 98.1 F (36.7 C)  TempSrc:   Oral Oral  SpO2: 99% 99% 98% 99%  Weight:      Height:       General exam: awake, alert, no acute distress HEENT: moist mucus membranes, hearing grossly normal  Respiratory system: CTAB, no wheezes, rales or rhonchi, normal respiratory effort. Cardiovascular system: normal S1/S2, RRR, no JVD, murmurs, rubs, gallops,  ankle/pedal edema noted symmetric.   Gastrointestinal system: soft, NT, ND, no HSM felt, +bowel sounds. Central nervous system: A&O x 3. no gross focal neurologic deficits, normal speech Skin: dry, intact, normal temperature Psychiatry: normal mood, congruent affect, judgement and insight appear normal   Data Reviewed:  Notable labs -- Na 126 >> 125 Bicarb 16 gap 10 BUN 114 Cr 2.74 >> 2.64 Ca 7.8 WBC 12.9 >> 10.9 Hbg 10.2 >> 9.2   Family Communication: daughter at bedside on rounds  Disposition: Status is: Inpatient Remains inpatient appropriate because: remains on IV antibiotics pending cultures   Planned Discharge Destination: Skilled nursing facility    Time spent: 45 minutes  Author: Montey Apa, DO 02/17/2024 1:35 PM  For on call review [email or web address].

## 2024-02-17 NOTE — Progress Notes (Signed)
     Subjective: NAEON. Pt sleeping on rounds  Objective: Vital signs in last 24 hours: Temp:  [97.3 F (36.3 C)-98.1 F (36.7 C)] 98.1 F (36.7 C) (06/10 0049) Pulse Rate:  [57-64] 64 (06/10 0049) Resp:  [16-17] 16 (06/10 0049) BP: (124-161)/(58-142) 124/58 (06/10 0049) SpO2:  [98 %-99 %] 99 % (06/10 0049)  Assessment/Plan: #chronic foley #urethral erosion #AKI #hyponatremia Renal US  with no evidence of hydronephrosis. TOV today. Not coherent enough to collect accurate PVR. Scheduled Q3 bladder scans with purewick. Replace foley if volume > or patient becomes uncomfortable.   # Prostate cancer Hyponatremia can occur in metastatic prostate cancer patient not been seen by [facility] since 2020.   Intake/Output from previous day: 06/09 0701 - 06/10 0700 In: 620 [P.O.:620] Out: 2500 [Urine:2500]  Intake/Output this shift: Total I/O In: 240 [P.O.:240] Out: -   Physical Exam:  General: Alert and oriented CV: No cyanosis Lungs: equal chest rise Abdomen: Soft, NTND, no rebound or guarding Skin: Gu: foley in place draining clear yellow urine. Erosion of meatus.   Lab Results: Recent Labs    02/16/24 0315 02/16/24 0859 02/17/24 0457  HGB 10.6* 10.2* 9.2*  HCT 30.5* 29.6* 27.3*   BMET Recent Labs    02/16/24 0315 02/16/24 0859 02/17/24 0457  NA 126*  --  125*  K 4.7  --  4.4  CL 96*  --  99  CO2 17*  --  16*  GLUCOSE 105*  --  107*  BUN 120*  --  114*  CREATININE 2.76* 2.83* 2.64*  CALCIUM  8.8*  --  7.8*  HGB 10.6* 10.2* 9.2*  WBC 15.1* 12.9* 10.9*     Studies/Results: US  RENAL Result Date: 02/16/2024 CLINICAL DATA:  History of prostate carcinoma EXAM: RENAL / URINARY TRACT ULTRASOUND COMPLETE COMPARISON:  None Available. FINDINGS: Right Kidney: Renal measurements: 10.1 x 4.9 x 4.2 cm. = volume: 110 mL. No mass or hydronephrosis is noted. Increased echogenicity is noted. Mild cortical thinning is seen. 2 cm simple cyst is noted in the lower  pole. Left Kidney: Renal measurements: 8.6 x 4.9 x 5.5 cm. = volume: 121 mL. Multiple cysts are noted throughout the left kidney. The largest of these is noted in the lower pole measuring 7.2 cm. No mass or hydronephrosis is noted. Bladder: Decompressed by Foley catheter. Other: None. IMPRESSION: Bilateral simple renal cysts.  No follow-up is recommended. Mild cortical thinning and increased echogenicity within the right kidney. Electronically Signed   By: Violeta Grey M.D.   On: 02/16/2024 22:59   DG Chest Port 1 View Result Date: 02/16/2024 CLINICAL DATA:  Chronic chest pain and possible sepsis EXAM: PORTABLE CHEST 1 VIEW COMPARISON:  01/28/2024 FINDINGS: Cardiac shadow is stable. Aortic calcifications and postoperative changes are again noted. Elevation of the right hemidiaphragm is seen. Lungs are well aerated without focal infiltrate. No bony abnormality is noted. IMPRESSION: No active disease. Electronically Signed   By: Violeta Grey M.D.   On: 02/16/2024 03:22      LOS: 1 day   Alla Ar, NP [facility] Pager: [phone]  02/17/2024, 1:21 PM

## 2024-02-17 NOTE — Evaluation (Signed)
 Physical Therapy Evaluation Patient Details Name: [name] [name] MRN: [id number] DOB: 04/11/1929 Today's Date: 02/17/2024  History of Present Illness  Pt is a 89 yr old male who presents from [facility] 02/16/24 due to abnormal labs. PMH: chronic back pain, HTN, HLD, CAD, prostate cancer, CKD stage III, BPH, urinary retention, essential tremor  Clinical Impression  Pt admitted for above however during PT/OT eval pt found to have a wound draining puss at opening of penis/catheter site and large L heel wound. RN and CNA were both notified. Pt has been at SNF since last admission on 5/21 due to fall. Pt with c/o pain in back and penis and numbness in feet. Pt requiring modAX2 for bed mobility and maxAX2 for sit to stand with use of stedy for safe transfer to chair. Acute PT to cont to follow. Recommend inpatient rehab program < 3 hrs a day to address weakness and balance deficits to improve transfer ability prior to transition home with daughter.        If plan is discharge home, recommend the following: A lot of help with bathing/dressing/bathroom;A lot of help with walking and/or transfers;Help with stairs or ramp for entrance;Assistance with cooking/housework   Can travel by private vehicle   No    Equipment Recommendations None recommended by PT  Recommendations for Other Services  Other (comment) (prevalon boots)    Functional Status Assessment Patient has had a recent decline in their functional status and demonstrates the ability to make significant improvements in function in a reasonable and predictable amount of time.     Precautions / Restrictions Precautions Precautions: Fall Restrictions Weight Bearing Restrictions Per Provider Order: No      Mobility  Bed Mobility Overal bed mobility: Needs Assistance Bed Mobility: Rolling, Sidelying to Sit Rolling: Mod assist, Used rails Sidelying to sit: Mod assist, +2 for physical assistance, HOB elevated, Used rails       General  bed mobility comments: pt slow to initiate but did assist will all mobility, modA to achieve full sidelying for LE management, modA for trunk elevation and LE management off EOB    Transfers Overall transfer level: Needs assistance Equipment used: Ambulation equipment used Transfers: Sit to/from Stand, Bed to chair/wheelchair/BSC Sit to Stand: Mod assist, Max assist, +2 physical assistance (up to stedy)           General transfer comment: pt completed 3 sit to stands with modAx2 from elevated height and maxAX2 from lower chair height to power up, once up pt able to maintain upright posture with tactile cues to posterior hips for 8-20 secs at a time    Ambulation/Gait               General Gait Details: unable this date  Stairs            Wheelchair Mobility     Tilt Bed    Modified Rankin (Stroke Patients Only)       Balance Overall balance assessment: Needs assistance Sitting-balance support: Feet supported, Bilateral upper extremity supported Sitting balance-Leahy Scale: Fair Sitting balance - Comments: pt with anterior bias, able to correct with cues, unable to maintain midline > 10 sec   Standing balance support: Bilateral upper extremity supported, During functional activity, Reliant on assistive device for balance Standing balance-Leahy Scale: Poor Standing balance comment: dependent on external support  Pertinent Vitals/Pain Pain Assessment Pain Assessment: Faces Faces Pain Scale: Hurts little more Pain Location: penis (site of wound at catheter), back Pain Descriptors / Indicators: Numbness, Discomfort Pain Intervention(s): Monitored during session, Patient requesting pain meds-RN notified    Home Living Family/patient expects to be discharged to:: Skilled nursing facility                   Additional Comments: pt has been at [facility] since recent admission for fall on 5/21. Prior to that  admission was living at home with daughter    Prior Function Prior Level of Function : Needs assist         Mobility (physical): Bed mobility;Transfers;Gait   Mobility Comments: pt reports facility using stedy and hoyer lift ADLs Comments: assist for all ADLs from SNF staff     Extremity/Trunk Assessment   Upper Extremity Assessment Upper Extremity Assessment: Defer to OT evaluation    Lower Extremity Assessment Lower Extremity Assessment: Generalized weakness (L LE stiffer than R)    Cervical / Trunk Assessment Cervical / Trunk Assessment: Kyphotic  Communication   Communication Communication: Impaired Factors Affecting Communication: Reduced clarity of speech    Cognition Arousal: Alert Behavior During Therapy: WFL for tasks assessed/performed                           PT - Cognition Comments: delayed response time, although very HOH, once re-oriented pt able to recall place and date Following commands: Intact (with increased time)       Cueing Cueing Techniques: Verbal cues, Tactile cues     General Comments General comments (skin integrity, edema, etc.): pt with noted puss and necrotic soft tissue at catheter site/penis openning, Pt's BP stable  129/60 supine, 131/64 in sitting. Pt also with large deep purple circle on L heel - RN notified. pt also with dry stool and was dependent for hygiene    Exercises     Assessment/Plan    PT Assessment Patient needs continued PT services  PT Problem List Decreased strength;Decreased activity tolerance;Decreased balance;Decreased mobility       PT Treatment Interventions DME instruction;Gait training;Stair training;Functional mobility training;Therapeutic activities;Therapeutic exercise;Balance training;Patient/family education    PT Goals (Current goals can be found in the Care Plan section)  Acute Rehab PT Goals Patient Stated Goal: get the foley catheter out PT Goal Formulation: With patient Time For  Goal Achievement: 03/02/24 Potential to Achieve Goals: Fair    Frequency Min 2X/week     Co-evaluation PT/OT/SLP Co-Evaluation/Treatment: Yes Reason for Co-Treatment: To address functional/ADL transfers PT goals addressed during session: Mobility/safety with mobility;Balance;Proper use of DME         AM-PAC PT "6 Clicks" Mobility  Outcome Measure Help needed turning from your back to your side while in a flat bed without using bedrails?: A Lot Help needed moving from lying on your back to sitting on the side of a flat bed without using bedrails?: A Lot Help needed moving to and from a bed to a chair (including a wheelchair)?: A Lot Help needed standing up from a chair using your arms (e.g., wheelchair or bedside chair)?: A Lot Help needed to walk in hospital room?: Total Help needed climbing 3-5 steps with a railing? : Total 6 Click Score: 10    End of Session   Activity Tolerance: Patient tolerated treatment well Patient left: in chair;with call bell/phone within reach;with chair alarm set Nurse Communication: Mobility status (  wound on penis and L heel, needs prevalon boots) PT Visit Diagnosis: Unsteadiness on feet (R26.81);Other abnormalities of gait and mobility (R26.89);Muscle weakness (generalized) (M62.81)    Time: 8295-6213 PT Time Calculation (min) (ACUTE ONLY): 49 min   Charges:   PT Evaluation $PT Eval Moderate Complexity: 1 Mod PT Treatments $Therapeutic Activity: 8-22 mins PT General Charges $$ ACUTE PT VISIT: 1 Visit         Renaee Caro, PT, DPT Acute Rehabilitation Services Secure chat preferred Office #: [phone]   Jenna Moan 02/17/2024, 9:14 AM

## 2024-02-17 NOTE — Consult Note (Addendum)
 Palliative Medicine Inpatient Consult Note  Consulting Provider:  Montey Apa, DO   Reason for consult:  Goals of care - pt and family requested  02/17/2024  HPI:  Per intake H&P --> [name] [name] is a 89 y.o. male with medical history significant of chronic back pain, HTN, HLD, CAD, prostate cancer, CKD stage III, BPH, urinary retention, essential tremor, and recent discharge to SNF after GLF on 5/21 iso orthostatic hypotension and bradycardia (Coreg  reduced) p/w reported abnormal lab and found to have purulent drainage from foley placed last admission and has been treated for a UTI. Rufus has been having significant pain and complains he "can't move" therefore the PMT has been consulted for additional goals of care conversations.   Clinical Assessment/Goals of Care:  *Please note that this is a verbal dictation therefore any spelling or grammatical errors are due to the "Dragon Medical One" system interpretation.  I have reviewed medical records including EPIC notes, labs and imaging, received report from bedside RN, assessed the patient who is lying in bed and shares pain in lower back.    I met with Egon and his daughter, [name] to further discuss diagnosis prognosis, GOC, EOL wishes, disposition and options.   I introduced Palliative Medicine as specialized medical care for people living with serious illness. It focuses on providing relief from the symptoms and stress of a serious illness. The goal is to improve quality of life for both the patient and the family.  Medical History Review and Understanding:  A review of [name] [name]'s past medical history significant for chronic back pain, hypertension, hyperlipidemia, prostate cancer, stage III chronic kidney disease, urinary retention in the setting of BPH, essential tremor, and recent falls was completed.  Social History:  [name] lives in [place] .  He shares that his wife is deceased she died in 2019-03-01.   He formerly was in the [employer].  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  He has 1 daughter and 1 son from a prior marriage who is deceased.  He has 3 grandchildren and 2 great-grandchildren.  He shares that he is someone who loves sports, fishing, hunting, and anything outdoors.  He is a man of great faith.  Functional and Nutritional State:  Preceding recent hospitalizations drawls was functioning fairly well.  Unfortunately after a recent hospitalization and SNF stay he has been immobilized.  He does endorse a good appetite.  Advance Directives:  A detailed discussion was had today regarding advanced directives.  [name] shares that his daughter, [name] is his Social research officer, government.  Code Status:  Concepts specific to code status, artifical feeding and hydration, continued IV antibiotics and rehospitalization was had.  The difference between a aggressive medical intervention path  and a palliative comfort care path for this patient at this time was had.   Encouraged patient/family to consider DNR/DNI status understanding evidenced based poor outcomes in similar hospitalized patient, as the cause of arrest is likely associated with advanced chronic/terminal illness rather than an easily reversible acute cardio-pulmonary event. I explained that DNR/DNI does not change the medical plan and it only comes into effect after a person has arrested (died).  It is a protective measure to keep us  from harming the patient in their last moments of life.  [name] was agreeable to DNR/DNI with understanding that patient would not receive CPR, defibrillation, ACLS medications, or intubation.   Discussion:  [name] and I discussed his traumatic fall in the  bathroom, his 1 week stay at [facility] and then his stay at [facility].  We reviewed that his stay at [facility] rehabilitation was the premise of his decline.  He shares had readmission in the setting of an infection at the  site of the foley.   Since coming in [name] has had worsening pain and endorses that he has lived a good 89 years and he does not desire living a life with less ability than prior. We reviewed the options one would be to continue the present course while the other would be to focus on comfort.  We talked about transition to comfort measures in house and what that would entail inclusive of medications to control pain, dyspnea, agitation, nausea, itching, and hiccups.  We discussed stopping all uneccessary measures such as cardiac monitoring, blood draws, needle sticks, and frequent vital signs  [name] is very tearful but feels he does not want to live this way. Utilized reflective listening throughout our time together.   [name] endorses wanting to be "out of pain" and \"comfortable". He does understands that comfort medications can make him more somnolent though accepts this. He only asks to spend time with his grandchildren prior to starting medications. He and his daughter agree that he would like to go to [facility] inpatient [facility] for end of life care.  Discussed the importance of continued conversation with family and their  medical providers regarding overall plan of care and treatment options, ensuring decisions are within the context of the patients values and GOCs.  Decision Maker: [name] (Daughter): [phone] (Mobile)   SUMMARY OF RECOMMENDATIONS   DNAR/DNI  Comfort Care  mIVF and IV antibiotics will be stopped  Have ordered dilaudid  1mg  Q4H ATC  Additional Medications per [facility]  Foley placement for comfort  Unrestricted visitation  TOC referral to [facility]  Ongoing PMT support  Code Status/Advance Care Planning: DNAR/DNI   Palliative Prophylaxis:  Aspiration, Bowel Regimen, Delirium Protocol, Frequent Pain Assessment, Oral Care, Palliative Wound Care, and Turn Reposition  Additional Recommendations (Limitations, Scope, Preferences): Continue  present care  Psycho-social/Spiritual:  Desire for further Chaplaincy support: Yes Additional Recommendations: Education on EOL care   Prognosis: Limited days to 2 weeks once aggressive symptom management is initiated.   Discharge Planning: Discharge to [facility] home.   Vitals:   02/17/24 0049 02/17/24 1614  BP: (!) 124/58 (!) 126/53  Pulse: 64 (!) 55  Resp: 16 18  Temp: 98.1 F (36.7 C) 98.3 F (36.8 C)  SpO2: 99% 99%    Intake/Output Summary (Last 24 hours) at 02/17/2024 1630 Last data filed at 02/17/2024 0800 Gross per 24 hour  Intake 860 ml  Output 1500 ml  Net -640 ml   Last Weight  Most recent update: 02/16/2024  3:45 AM    Weight  84 kg (185 lb 3 oz)            Gen:  Elderly Caucasian M chronically ill appearing HEENT: moist mucous membranes CV: Regular rate and rhythm  PULM: On RA, breathing is even and nonlabored ABD: soft/nontender  EXT: Generalized edema  Neuro: Alert and oriented x3   PPS: 20-30%   This conversation/these recommendations were discussed with patient primary care team, Dr. Antoniette Batty ______________________________________________________ Camille [facility] Palliative Medicine Team Team Cell Phone: [phone] Please utilize secure chat with additional questions, if there is no response within 30 minutes please call the above phone number  Total Time: 75 Billing based on MDM: High  Palliative  Medicine Team providers are available by phone from 7am to 7pm daily and can be reached through the team cell phone.  Should this patient require assistance outside of these hours, please call the patient's attending physician.

## 2024-02-17 NOTE — Progress Notes (Signed)
 Pt still c/o pain all over at 7/10. Pt stated Tylenol  has not done anything. RN messaged MD on call to make aware and to see if we can get an order for something stronger.   [name] [name] [name]

## 2024-02-17 NOTE — Evaluation (Signed)
 Occupational Therapy Evaluation Patient Details Name: JUANYA VILLAVICENCIO MRN: [id number] DOB: February 18, 1929 Today's Date: 02/17/2024   History of Present Illness   Pt is a 89 yr old male who presents from [facility] 02/16/24 due to abnormal labs. PMH: chronic back pain, HTN, HLD, CAD, prostate cancer, CKD stage III, BPH, urinary retention, essential tremor     Clinical Impressions Pt reported when they have been at SNF they were using steady vs lift for mobility and assist with ADLS. Pt presented with draining puss from penis/catheter, large L heel wound and was unaware they had a bm. Pt completed bed mobility with mod x2 for bed mobility, max assist x2 with stedy for sit to stands off different surface height with max standing tolerance of about 20 seconds. He then required set up of food materials to be able to participate in self feeding. Patient will benefit from continued inpatient follow up therapy, <3 hours/day.      If plan is discharge home, recommend the following:   Two people to help with walking and/or transfers;A lot of help with bathing/dressing/bathroom;Assistance with feeding;Direct supervision/assist for medications management;Direct supervision/assist for financial management;Assist for transportation;Help with stairs or ramp for entrance;Supervision due to cognitive status     Functional Status Assessment   Patient has had a recent decline in their functional status and demonstrates the ability to make significant improvements in function in a reasonable and predictable amount of time.     Equipment Recommendations    (TBD)     Recommendations for Other Services         Precautions/Restrictions   Precautions Precautions: Fall Recall of Precautions/Restrictions: Intact Precaution/Restrictions Comments: L heel Restrictions Weight Bearing Restrictions Per Provider Order: No     Mobility Bed Mobility Overal bed mobility: Needs Assistance Bed Mobility: Rolling,  Sidelying to Sit Rolling: Mod assist, Used rails Sidelying to sit: Mod assist, +2 for physical assistance, HOB elevated, Used rails Supine to sit: Min assist, Mod assist, HOB elevated     General bed mobility comments: pt slow to initiate but did assist will all mobility, modA to achieve full sidelying for LE management, modA for trunk elevation and LE management off EOB    Transfers Overall transfer level: Needs assistance Equipment used: Ambulation equipment used Transfers: Sit to/from Stand, Bed to chair/wheelchair/BSC Sit to Stand: Mod assist, Max assist, +2 physical assistance (with steady)           General transfer comment: pt completed 3 sit to stands with modAx2 from elevated height and maxAX2 from lower chair height to power up, once up pt able to maintain upright posture with tactile cues to posterior hips for 8-20 secs at a time      Balance Overall balance assessment: Needs assistance Sitting-balance support: Feet supported, Bilateral upper extremity supported Sitting balance-Leahy Scale: Fair Sitting balance - Comments: pt with anterior bias, able to correct with cues, unable to maintain midline > 10 sec   Standing balance support: Bilateral upper extremity supported, During functional activity, Reliant on assistive device for balance Standing balance-Leahy Scale: Poor Standing balance comment: dependent on external support                           ADL either performed or assessed with clinical judgement   ADL Overall ADL's : Needs assistance/impaired Eating/Feeding: Set up;Sitting   Grooming: Set up;Sitting   Upper Body Bathing: Minimal assistance;Sitting   Lower Body Bathing: Maximal assistance;Bed level  Upper Body Dressing : Minimal assistance;Sitting   Lower Body Dressing: Maximal assistance;Bed level   Toilet Transfer: Moderate assistance;+2 for physical assistance;+2 for safety/equipment;BSC/3in1 Toilet Transfer Details (indicate cue  type and reason): simulated with chair and use of steady Toileting- Clothing Manipulation and Hygiene: Total assistance;Bed level               Vision Baseline Vision/History: 1 Wears glasses Ability to See in Adequate Light: 1 Impaired Patient Visual Report:  (no glassess present in session)       Perception         Praxis         Pertinent Vitals/Pain Pain Assessment Pain Assessment: Faces Faces Pain Scale: Hurts little more Pain Location: penis (site of wound at catheter), back Pain Descriptors / Indicators: Numbness, Discomfort Pain Intervention(s): Limited activity within patient's tolerance, Monitored during session, Repositioned     Extremity/Trunk Assessment Upper Extremity Assessment Upper Extremity Assessment: Generalized weakness RUE Deficits / Details: 3-/5 shoulder flexion; 4+/5 elbow flexion; 4+/5 elbow extension, wrist flexion and extension 3+/5, composit grasp 3+/5. Poor fine motor skills. RUE Coordination: decreased fine motor LUE Deficits / Details: 3-/5 shoulder flexion; 4-/5 elbow flexion; 3+/5 elbow extension, wrist flexion and extension 3+/5, composit grasp 3+/5. Poor fine motor skills. Mild gross motor deficit as well. LUE Sensation: decreased light touch LUE Coordination: decreased fine motor   Lower Extremity Assessment Lower Extremity Assessment: Defer to PT evaluation   Cervical / Trunk Assessment Cervical / Trunk Assessment: Kyphotic   Communication Communication Communication: Impaired Factors Affecting Communication: Reduced clarity of speech   Cognition Arousal: Alert Behavior During Therapy: WFL for tasks assessed/performed Cognition: Cognition impaired   Orientation impairments: Situation Awareness: Intellectual awareness intact Memory impairment (select all impairments): Short-term memory Attention impairment (select first level of impairment): Divided attention Executive functioning impairment (select all impairments):  Problem solving OT - Cognition Comments: Pt noted in session increase in time to recall and sometimes unable to recall information within the session                 Following commands: Intact (with increase in time)       Cueing  General Comments   Cueing Techniques: Verbal cues;Tactile cues  pt with noted puss and necrotic soft tissue at catheter site/penis openning, Pt's BP stable 129/60 supine, 131/64 in sitting. Pt also with large deep purple circle on L heel - RN notified. pt also with dry stool and was dependent for hygiene   Exercises     Shoulder Instructions      Home Living Family/patient expects to be discharged to:: Skilled nursing facility Living Arrangements: Children Available Help at Discharge: Family;Available PRN/intermittently Type of Home: House Home Access: Stairs to enter [employer] of Steps: 2 Entrance Stairs-Rails: None Home Layout: One level     Bathroom Shower/Tub: Producer, television/film/video: Standard Bathroom Accessibility: No   Home Equipment: Agricultural consultant (2 wheels);Cane - quad;Grab bars - toilet;Shower seat   Additional Comments: pt has been at [facility] since recent admission for fall on 5/21. Prior to that admission was living at home with daughter      Prior Functioning/Environment Prior Level of Function : Needs assist       Physical Assist : ADLs (physical);Mobility (physical) Mobility (physical): Bed mobility;Transfers;Gait ADLs (physical): IADLs;Bathing Mobility Comments: pt reports facility using stedy and hoyer lift ADLs Comments: assist for all ADLs from SNF staff    OT Problem List: Decreased strength;Decreased range of  motion;Decreased activity tolerance;Impaired balance (sitting and/or standing);Decreased coordination;Impaired sensation;Impaired UE functional use   OT Treatment/Interventions: Self-care/ADL training;Therapeutic exercise;Neuromuscular education;Energy conservation;DME  and/or AE instruction;Therapeutic activities;Patient/family education;Visual/perceptual remediation/compensation;Balance training      OT Goals(Current goals can be found in the care plan section)   Acute Rehab OT Goals Patient Stated Goal: to have less pain OT Goal Formulation: With patient Time For Goal Achievement: 03/02/24 Potential to Achieve Goals: Good ADL Goals Pt Will Perform Eating: Independently;sitting Pt Will Perform Grooming: Independently;sitting Pt Will Perform Upper Body Bathing: Independently;sitting Pt Will Perform Lower Body Bathing: with max assist;sitting/lateral leans Pt Will Perform Upper Body Dressing: Independently;sitting Pt Will Perform Lower Body Dressing: with max assist;sitting/lateral leans Additional ADL Goal #1: Pt will be able to sit at EOB with no support for 8 mins while completion of ADLS   OT Frequency:  Min 2X/week    Co-evaluation PT/OT/SLP Co-Evaluation/Treatment: Yes Reason for Co-Treatment: To address functional/ADL transfers   OT goals addressed during session: ADL's and self-care      AM-PAC OT "6 Clicks" Daily Activity     Outcome Measure Help from another person eating meals?: A Little Help from another person taking care of personal grooming?: A Little Help from another person toileting, which includes using toliet, bedpan, or urinal?: Total Help from another person bathing (including washing, rinsing, drying)?: A Lot Help from another person to put on and taking off regular upper body clothing?: A Little Help from another person to put on and taking off regular lower body clothing?: A Lot 6 Click Score: 14   End of Session Equipment Utilized During Treatment: Rolling walker (2 wheels);Gait belt Nurse Communication: Mobility status (skin, penis)  Activity Tolerance: Patient tolerated treatment well Patient left: in chair;with call bell/phone within reach;with chair alarm set  OT Visit Diagnosis: Unsteadiness on feet  (R26.81);Other abnormalities of gait and mobility (R26.89);Muscle weakness (generalized) (M62.81);History of falling (Z91.81);Other symptoms and signs involving the nervous system (R29.898)                Time: 1610-9604 OT Time Calculation (min): 46 min Charges:  OT General Charges $OT Visit: 1 Visit OT Evaluation $OT Eval Low Complexity: 1 Low  Erving Heather OTR/L  Acute Rehab Services  [phone] office number   Stevphen Elders 02/17/2024, 10:06 AM

## 2024-02-18 LAB — URINE CULTURE: Culture: >=100000 — AB

## 2024-02-18 NOTE — TOC Progression Note (Signed)
 Transition of Care [facility]) - Progression Note    Patient Details  Name: [name] [name] MRN: [id number] Date of Birth: 01-Jan-1929  Transition of Care [facility]) CM/SW Contact  Elspeth Hals, LCSW Phone Number: 02/18/2024, 1:08 PM  Clinical Narrative:   CSW spoke with [facility]: evaluation still planned for today but will be around 1530.      Expected Discharge Plan: Skilled Nursing Facility Barriers to Discharge: Continued Medical Work up, English as a second language teacher, SNF Pending bed offer  Expected Discharge Plan and Services In-house Referral: Clinical Social Work   Post Acute Care Choice: Skilled Nursing Facility Living arrangements for the past 2 months: Single Family Home, Skilled Nursing Facility                                       Social Determinants of Health (SDOH) Interventions SDOH Screenings   Food Insecurity: No Food Insecurity (02/18/2024)  Housing: Low Risk  (02/18/2024)  Transportation Needs: No Transportation Needs (02/18/2024)  Utilities: Not At Risk (02/18/2024)  Depression (PHQ2-9): High Risk (06/10/2023)  Social Connections: Socially Isolated (02/18/2024)  Tobacco Use: Low Risk  (02/16/2024)    Readmission Risk Interventions     No data to display

## 2024-02-18 NOTE — Progress Notes (Signed)
   Palliative Medicine Inpatient Follow Up Note HPI: [name] [name] is a 89 y.o. male with medical history significant of chronic back pain, HTN, HLD, CAD, prostate cancer, CKD stage III, BPH, urinary retention, essential tremor, and recent discharge to SNF after GLF on 5/21 iso orthostatic hypotension and bradycardia (Coreg  reduced) p/w reported abnormal lab and found to have purulent drainage from foley placed last admission and has been treated for a UTI. [name] [name] has been having significant pain and complains he can't move therefore the PMT has been consulted for additional goals of care conversations.   Today's Discussion 02/18/2024  *Please note that this is a verbal dictation therefore any spelling or grammatical errors are due to the Dragon Medical One system interpretation.  Chart reviewed inclusive of vital signs, progress notes, laboratory results, and diagnostic images.   A symptoms check was completed this morning. [name] [name] is more confused in the setting of medication initiation. He, his daughter, and grandson are present. We confirmed the plan for continued comfort care and for [name] [name] to go to an inpatient hospice facility.   Created space and opportunity for patients family to explore thoughts feelings and fears regarding [name] [name]' current medical situation. They share understanding of the plan at this time.   [facility] plans to come and evaluate [name] [name] this morning.  Family share if not accepted to [facility] that any other inpatient hospice home will be okay.   Questions and concerns addressed/Palliative Support Provided.   Objective Assessment: Vital Signs Vitals:   02/18/24 0536 02/18/24 0906  BP: (!) 125/53 136/72  Pulse: (!) 59 65  Resp: 18 19  Temp: 98.3 F (36.8 C) 98.9 F (37.2 C)  SpO2: 97% 95%    Intake/Output Summary (Last 24 hours) at 02/18/2024 1309 Last data filed at 02/18/2024 1610 Gross per 24 hour  Intake 240 ml  Output 1200 ml  Net -960  ml   Last Weight  Most recent update: 02/16/2024  3:45 AM    Weight  84 kg (185 lb 3 oz)            Gen:  Elderly Caucasian M chronically ill appearing HEENT: moist mucous membranes CV: Regular rate and rhythm  PULM: On RA, breathing is even and nonlabored ABD: soft/nontender  EXT: Generalized edema  Neuro: Alert and oriented x3   SUMMARY OF RECOMMENDATIONS   DNAR/DNI   Comfort Care    Continued dilaudid  1mg  Q4H ATC   Additional Medications per [facility]   Unrestricted visitation   TOC referral to [facility]   Ongoing PMT support  Billing based on MDM: High in the setting of review and modification parenterally controlled substances ______________________________________________________________________________________ [name] [name] Palliative Medicine Team Team Cell Phone: [phone] Please utilize secure chat with additional questions, if there is no response within 30 minutes please call the above phone number  Palliative Medicine Team providers are available by phone from 7am to 7pm daily and can be reached through the team cell phone.  Should this patient require assistance outside of these hours, please call the patient's attending physician.

## 2024-02-18 NOTE — Progress Notes (Signed)
 [facility] PROGRESS NOTE  Patient: [name] [name] YNW:[id number]   PCP: Donnie Galea, MD DOB: February 16, 1929   DOA: 02/16/2024   DOS: 02/18/2024    Subjective: Palliative care reached out to me that the patient has decided to transition to comfort care.  Daughter reported no other acute complaint at bedside.  Reports intermittent confusion.  Objective:  Vitals:   02/17/24 1614 02/17/24 2009 02/18/24 0536 02/18/24 0906  BP: (!) 126/53 118/61 (!) 125/53 136/72  Pulse: (!) 55 (!) 56 (!) 59 65  Resp: 18 18 18 19   Temp: 98.3 F (36.8 C) 97.6 F (36.4 C) 98.3 F (36.8 C) 98.9 F (37.2 C)  TempSrc:      SpO2: 99% 99% 97% 95%  Weight:      Height:       Patient was sleeping at the time of my evaluation. Assessment and plan: Currently based on the conversation with palliative care transitioning to comfort care in the hospital and goal is to go to residential hospice on discharge.  Author: Charlean Congress, MD [facility] 02/18/2024 6:54 PM   If 7PM-7AM, please contact night-coverage at [email or web address]

## 2024-02-18 NOTE — Progress Notes (Signed)
 Patient went back into urinary retention yesterday afternoon and Foley catheter was replaced for palliation. The shared decision was made to transition to comfort measures. Patient was asleep on rounds, I did not wake him. I reviewed plan with his family who will contact us  if they have any questions or concerns during this difficult process. No charge for this visit.

## 2024-02-19 DIAGNOSIS — Z515 Encounter for palliative care: Secondary | ICD-10-CM

## 2024-02-19 DIAGNOSIS — L8990 Pressure ulcer of unspecified site, unspecified stage: Secondary | ICD-10-CM | POA: Diagnosis present

## 2024-02-19 MED ORDER — HYDROMORPHONE HCL 1 MG/ML IJ SOLN: 1.0000 mg | INTRAMUSCULAR | Status: DC

## 2024-02-19 MED ORDER — LORAZEPAM 2 MG/ML IJ SOLN: 0.5000 mg | INTRAMUSCULAR | Status: DC | PRN

## 2024-02-19 MED ORDER — LORAZEPAM 2 MG/ML IJ SOLN: 0.5000 mg | Freq: Once | INTRAMUSCULAR | Status: DC

## 2024-02-19 MED ORDER — HYDROXYZINE HCL 25 MG PO TABS
25.0000 mg | ORAL_TABLET | Freq: Three times a day (TID) | ORAL | Status: DC | PRN
  Administered 2024-02-19: 25 mg via ORAL
  Filled 2024-02-19: qty 1

## 2024-02-19 NOTE — TOC Transition Note (Signed)
 Transition of Care [facility]) - Discharge Note   Patient Details  Name: [name] [name] MRN: [id number] Date of Birth: 26-Nov-1928  Transition of Care [facility] CM/SW Contact:  Jeffory Mings, [place] Phone Number: 02/19/2024, 11:25 AM   Clinical Narrative:  Pt for dc to [name] today. Spoke to [employer] in [facility] intake who confirmed they are prepared to admit pt. RN provided with number for report and PTAR arranged for transport. Pt's dtr [name] aware of dc and reports agreeable. SW signing off at dc.   Paullette Boston, MSW, LCSW [phone] (coverage)      Final next level of care: Skilled Nursing Facility Barriers to Discharge: Barriers Resolved   Patient Goals and CMS Choice            Discharge Placement                Patient to be transferred to facility by: [facility] Name of family member notified: [name]/dtr Patient and family notified of of transfer: 02/19/24  Discharge Plan and Services Additional resources added to the After Visit Summary for   In-house Referral: Clinical Social Work   Post Acute Care Choice: Skilled Nursing Facility                               Social Drivers of Health (SDOH) Interventions SDOH Screenings   Food Insecurity: No Food Insecurity (02/18/2024)  Housing: Low Risk  (02/18/2024)  Transportation Needs: No Transportation Needs (02/18/2024)  Utilities: Not At Risk (02/18/2024)  Depression (PHQ2-9): High Risk (06/10/2023)  Social Connections: Socially Isolated (02/18/2024)  Tobacco Use: Low Risk  (02/16/2024)     Readmission Risk Interventions     No data to display

## 2024-02-19 NOTE — TOC Progression Note (Signed)
 Transition of Care [facility]) - Progression Note    Patient Details  Name: [name] [name] MRN: [id number] Date of Birth: 07-30-1929  Transition of Care [facility]) CM/SW Contact  Paullette Boston [place], [place] Phone Number: 02/19/2024, 10:19 AM  Clinical Narrative:  Eldora Greet to Burdette Carolin with [facility] who confirmed pt has been approved for [facility] and they have a bed available today. MD updated. Plan for dc today.  Paullette Boston, MSW, LCSW [phone] (coverage)       Expected Discharge Plan: Skilled Nursing Facility Barriers to Discharge: Continued Medical Work up, English as a second language teacher, SNF Pending bed offer  Expected Discharge Plan and Services In-house Referral: Clinical Social Work   Post Acute Care Choice: Skilled Nursing Facility Living arrangements for the past 2 months: Single Family Home, Skilled Nursing Facility                                       Social Determinants of Health (SDOH) Interventions SDOH Screenings   Food Insecurity: No Food Insecurity (02/18/2024)  Housing: Low Risk  (02/18/2024)  Transportation Needs: No Transportation Needs (02/18/2024)  Utilities: Not At Risk (02/18/2024)  Depression (PHQ2-9): High Risk (06/10/2023)  Social Connections: Socially Isolated (02/18/2024)  Tobacco Use: Low Risk  (02/16/2024)    Readmission Risk Interventions     No data to display

## 2024-02-19 NOTE — Progress Notes (Signed)
 Pt discharge to [facility] with IV and Foley cath in place per MD's order.

## 2024-02-19 NOTE — Plan of Care (Signed)
 Pt will be discharged with foley catheter in place. Report given to Augusta Leaver at [facility].

## 2024-02-19 NOTE — Discharge Summary (Addendum)
 Physician Discharge Summary   Patient: [name] [name] MRN: [id number] DOB: 1928/10/11  Admit date:     02/16/2024  Discharge date: 02/19/24  Discharge Physician: Charlean Congress  PCP: Donnie Galea, MD  Recommendations at discharge:  Establish care with hospice.  Discharge Diagnoses: Principal Problem:   Foley catheter problem, initial encounter [facility]) Active Problems:   Hypothyroidism   Essential hypertension   CAD (coronary artery disease)   GERD (gastroesophageal reflux disease)   Acute renal failure superimposed on stage 3b chronic kidney disease (HCC)   History of prostate cancer   BPH (benign prostatic hyperplasia)   Failure to thrive in adult   Hospice care patient   Pressure injury of skin  Hospital Course: Purulent drainage from foley ESBL E coli CAUTI Urology consulted; apprec eval/recs Was on IV cefepime  Now comfort care.    Hyponatremia Na 126 on admission, from 131 on 5/26 Now comfort care.   AKI superimposed on CKD stage IIIb Baseline creatinine 1.4-1.6 Presented with Cr 2.76 Treated with IV fluids Now comfort care.    HTN PTA Coreg  12.5mg  BID and lisinopril  20mg  daily On hold in hospital   Constipation PTA senna/docusate and miralax   Possible C6-C7 degenerative disc disease with radiculopathy History of laminectomy and fusion of C3-C6 CT cervical spine noted mild to moderate multilevel cervical disc degenerative change, worse around C6-C7 Was discussed with neurosurgery PA on 01/31/2024, recommend outpatient follow-up, for further management Continue pain control.   Hyperlipidemia Was on statin   Essential tremors on home primidone  Witnessed some tremors near the discharge, resolved on its own and pt was able to arouse easily and follow commands easily. Recommended to continue primidone  but if needed can get ativan  instead if he can't swallow primidone .   Consultants:  Palliative care  Urology  Procedures performed:   none  DISCHARGE MEDICATION: Allergies as of 02/19/2024   No Known Allergies      Medication List     STOP taking these medications    allopurinol  300 MG tablet Commonly known as: ZYLOPRIM    atorvastatin  40 MG tablet Commonly known as: LIPITOR   bisacodyl  5 MG EC tablet Generic drug: bisacodyl    carvedilol  12.5 MG tablet Commonly known as: COREG    doxycycline  100 MG capsule Commonly known as: VIBRAMYCIN    gabapentin  100 MG capsule Commonly known as: NEURONTIN    lisinopril  20 MG tablet Commonly known as: ZESTRIL    melatonin 3 MG Tabs tablet   mupirocin ointment 2 % Commonly known as: BACTROBAN   omeprazole  40 MG capsule Commonly known as: PRILOSEC   polyethylene glycol 17 g packet Commonly known as: MIRALAX  / GLYCOLAX    senna-docusate 8.6-50 MG tablet Commonly known as: Senokot-S   sodium chloride  0.65 % Soln nasal spray Commonly known as: OCEAN   traMADol  HCl 25 MG Tabs       TAKE these medications    acetaminophen  500 MG tablet Commonly known as: TYLENOL  Take 2 tablets (1,000 mg total) by mouth 3 (three) times daily as needed for mild pain or moderate pain.   HYDROmorphone  1 MG/ML injection Commonly known as: DILAUDID  Inject 1 mL (1 mg total) into the vein every 4 (four) hours.   LORazepam  2 MG/ML injection Commonly known as: ATIVAN  Inject 0.25-0.5 mLs (0.5-1 mg total) into the vein every hour as needed for anxiety, seizure or sedation (tremors).   mirtazapine  7.5 MG tablet Commonly known as: REMERON  TAKE 1 TABLET(7.5 MG) BY MOUTH AT BEDTIME. STOP TRAZODONE  WITH MIRTAZAPINE  USE  primidone  50 MG tablet Commonly known as: MYSOLINE  TAKE ONE-HALF (1/2) TABLET DAILY   silodosin  8 MG Caps capsule Commonly known as: RAPAFLO  TAKE 1 CAPSULE AT BEDTIME       Disposition: Residential hospice Diet recommendation: Regular diet  Discharge Exam: Vitals:   02/17/24 2009 02/18/24 0536 02/18/24 0906 02/19/24 0956  BP: 118/61 (!) 125/53 136/72  139/64  Pulse: (!) 56 (!) 59 65 64  Resp: 18 18 19 19   Temp: 97.6 F (36.4 C) 98.3 F (36.8 C) 98.9 F (37.2 C) 98.4 F (36.9 C)  TempSrc:      SpO2: 99% 97% 95% 98%  Weight:      Height:       General: Appear in mild distress; no visible Abnormal Neck Mass Or lumps, Conjunctiva normal Cardiovascular: S1 and S2 Present, no Murmur, Respiratory: good respiratory effort, Bilateral Air entry present and CTA, no Crackles, no wheezes Abdomen: Bowel Sound present,  Extremities: no Pedal edema Neurology: lethargic and oriented to self, easily aroused. Had some tremors.  Filed Weights   02/16/24 0345  Weight: 84 kg   Condition at discharge: stable  The results of significant diagnostics from this hospitalization (including imaging, microbiology, ancillary and laboratory) are listed below for reference.   Imaging Studies: US  RENAL Result Date: 02/16/2024 CLINICAL DATA:  History of prostate carcinoma EXAM: RENAL / URINARY TRACT ULTRASOUND COMPLETE COMPARISON:  None Available. FINDINGS: Right Kidney: Renal measurements: 10.1 x 4.9 x 4.2 cm. = volume: 110 mL. No mass or hydronephrosis is noted. Increased echogenicity is noted. Mild cortical thinning is seen. 2 cm simple cyst is noted in the lower pole. Left Kidney: Renal measurements: 8.6 x 4.9 x 5.5 cm. = volume: 121 mL. Multiple cysts are noted throughout the left kidney. The largest of these is noted in the lower pole measuring 7.2 cm. No mass or hydronephrosis is noted. Bladder: Decompressed by Foley catheter. Other: None. IMPRESSION: Bilateral simple renal cysts.  No follow-up is recommended. Mild cortical thinning and increased echogenicity within the right kidney. Electronically Signed   By: Violeta Grey M.D.   On: 02/16/2024 22:59   DG Chest Port 1 View Result Date: 02/16/2024 CLINICAL DATA:  Chronic chest pain and possible sepsis EXAM: PORTABLE CHEST 1 VIEW COMPARISON:  01/28/2024 FINDINGS: Cardiac shadow is stable. Aortic calcifications  and postoperative changes are again noted. Elevation of the right hemidiaphragm is seen. Lungs are well aerated without focal infiltrate. No bony abnormality is noted. IMPRESSION: No active disease. Electronically Signed   By: Violeta Grey M.D.   On: 02/16/2024 03:22   ECHOCARDIOGRAM COMPLETE Result Date: 01/29/2024    ECHOCARDIOGRAM REPORT   Patient Name:   HAMP MORELAND Date of Exam: 01/29/2024 Medical Rec #:  [id number]          Height:       71.0 in Accession #:    [id number]         Weight:       182.1 lb Date of Birth:  01-21-1929          BSA:          2.026 m Patient Age:    89 years           BP:           137/70 mmHg Patient Gender: M                  HR:           53  bpm. Exam Location:  [facility] Procedure: 2D Echo, Color Doppler and Cardiac Doppler (Both Spectral and Color            Flow Doppler were utilized during procedure). Indications:    R55 Syncope  History:        Patient has no prior history of Echocardiogram examinations.  Sonographer:    Andrena Bang Referring Phys: [id number] DAWOOD S [place] IMPRESSIONS  1. Left ventricular ejection fraction, by estimation, is 55 to 60%. The left ventricle has normal function. The left ventricle has no regional wall motion abnormalities. There is mild left ventricular hypertrophy. Left ventricular diastolic parameters are consistent with Grade I diastolic dysfunction (impaired relaxation).  2. Right ventricular systolic function is normal. The right ventricular size is normal.  3. Left atrial size was mildly dilated.  4. The mitral valve is abnormal. Trivial mitral valve regurgitation. No evidence of mitral stenosis.  5. The aortic valve is tricuspid. There is moderate calcification of the aortic valve. There is moderate thickening of the aortic valve. Aortic valve regurgitation is not visualized. Mild aortic valve stenosis.  6. Aortic dilatation noted. There is mild dilatation of the aortic root, measuring 38 mm. There is moderate dilatation of the  ascending aorta, measuring 41 mm.  7. The inferior vena cava is normal in size with greater than 50% respiratory variability, suggesting right atrial pressure of 3 mmHg. FINDINGS  Left Ventricle: Left ventricular ejection fraction, by estimation, is 55 to 60%. The left ventricle has normal function. The left ventricle has no regional wall motion abnormalities. Strain was performed and the global longitudinal strain is indeterminate. The left ventricular internal cavity size was normal in size. There is mild left ventricular hypertrophy. Left ventricular diastolic parameters are consistent with Grade I diastolic dysfunction (impaired relaxation). Right Ventricle: The right ventricular size is normal. No increase in right ventricular wall thickness. Right ventricular systolic function is normal. Left Atrium: Left atrial size was mildly dilated. Right Atrium: Right atrial size was normal in size. Pericardium: There is no evidence of pericardial effusion. Mitral Valve: The mitral valve is abnormal. There is mild thickening of the mitral valve leaflet(s). There is mild calcification of the mitral valve leaflet(s). Mild mitral annular calcification. Trivial mitral valve regurgitation. No evidence of mitral valve stenosis. Tricuspid Valve: The tricuspid valve is normal in structure. Tricuspid valve regurgitation is not demonstrated. No evidence of tricuspid stenosis. Aortic Valve: The aortic valve is tricuspid. There is moderate calcification of the aortic valve. There is moderate thickening of the aortic valve. Aortic valve regurgitation is not visualized. Mild aortic stenosis is present. Aortic valve mean gradient measures 4.0 mmHg. Aortic valve peak gradient measures 9.4 mmHg. Aortic valve area, by VTI measures 1.79 cm. Pulmonic Valve: The pulmonic valve was normal in structure. Pulmonic valve regurgitation is not visualized. No evidence of pulmonic stenosis. Aorta: Aortic dilatation noted. There is mild dilatation of  the aortic root, measuring 38 mm. There is moderate dilatation of the ascending aorta, measuring 41 mm. Venous: The inferior vena cava is normal in size with greater than 50% respiratory variability, suggesting right atrial pressure of 3 mmHg. IAS/Shunts: The interatrial septum was not well visualized. Additional Comments: 3D was performed not requiring image post processing on an independent workstation and was indeterminate.  LEFT VENTRICLE PLAX 2D LVIDd:         4.90 cm      Diastology LVIDs:         3.80 cm  LV e' medial:    5.98 cm/s LV PW:         1.10 cm      LV E/e' medial:  10.4 LV IVS:        1.20 cm      LV e' lateral:   6.53 cm/s LVOT diam:     2.10 cm      LV E/e' lateral: 9.6 LV SV:         60 LV SV Index:   30 LVOT Area:     3.46 cm  LV Volumes (MOD) LV vol d, MOD A2C: 112.0 ml LV vol d, MOD A4C: 89.6 ml LV vol s, MOD A2C: 33.8 ml LV vol s, MOD A4C: 31.2 ml LV SV MOD A2C:     78.2 ml LV SV MOD A4C:     89.6 ml LV SV MOD BP:      68.1 ml RIGHT VENTRICLE RV S prime:     5.98 cm/s TAPSE (M-mode): 1.1 cm LEFT ATRIUM             Index LA diam:        4.10 cm 2.02 cm/m LA Vol (A2C):   56.1 ml 27.69 ml/m LA Vol (A4C):   63.4 ml 31.29 ml/m LA Biplane Vol: 59.1 ml 29.17 ml/m  AORTIC VALVE AV Area (Vmax):    1.61 cm AV Area (Vmean):   1.65 cm AV Area (VTI):     1.79 cm AV Vmax:           153.00 cm/s AV Vmean:          91.100 cm/s AV VTI:            0.335 m AV Peak Grad:      9.4 mmHg AV Mean Grad:      4.0 mmHg LVOT Vmax:         71.10 cm/s LVOT Vmean:        43.300 cm/s LVOT VTI:          0.173 m LVOT/AV VTI ratio: 0.52  AORTA Ao Asc diam: 4.10 cm MITRAL VALVE MV Area (PHT): 2.67 cm    SHUNTS MV Decel Time: 284 msec    Systemic VTI:  0.17 m MV E velocity: 62.40 cm/s  Systemic Diam: 2.10 cm MV A velocity: 96.80 cm/s MV E/A ratio:  0.64 Janelle Mediate MD Electronically signed by Janelle Mediate MD Signature Date/Time: 01/29/2024/2:45:08 PM    Final    DG Pelvis Portable Result Date: 01/28/2024 CLINICAL  DATA:  Fall. EXAM: PORTABLE PELVIS 1-2 VIEWS COMPARISON:  None Available. FINDINGS: There is no evidence of pelvic fracture or diastasis. No pelvic bone lesions are seen. IMPRESSION: Negative. Electronically Signed   By: Rosalene Colon M.D.   On: 01/28/2024 14:46   DG Chest Portable 1 View Result Date: 01/28/2024 CLINICAL DATA:  Fall. EXAM: PORTABLE CHEST 1 VIEW COMPARISON:  October 24, 2023. FINDINGS: Stable cardiomegaly. Status post coronary artery bypass graft. No acute pulmonary disease. Bony thorax is unremarkable. IMPRESSION: No active disease. Electronically Signed   By: Rosalene Colon M.D.   On: 01/28/2024 14:45   CT Head Wo Contrast Result Date: 01/28/2024 CLINICAL DATA:  Syncope, fall, head injury EXAM: CT HEAD WITHOUT CONTRAST CT MAXILLOFACIAL WITHOUT CONTRAST CT CERVICAL SPINE WITHOUT CONTRAST TECHNIQUE: Multidetector CT imaging of the head, cervical spine, and maxillofacial structures were performed using the standard protocol without intravenous contrast. Multiplanar CT image reconstructions of the cervical spine and maxillofacial  structures were also generated. RADIATION DOSE REDUCTION: This exam was performed according to the departmental dose-optimization program which includes automated exposure control, adjustment of the mA and/or kV according to patient size and/or use of iterative reconstruction technique. COMPARISON:  08/13/2022 FINDINGS: CT HEAD FINDINGS Brain: No evidence of acute infarction, hemorrhage, hydrocephalus, extra-axial collection or mass lesion/mass effect. Extensive periventricular and deep white matter hypodensity. Vascular: No hyperdense vessel or unexpected calcification. CT FACIAL BONES FINDINGS Skull: Normal. Negative for fracture or focal lesion. Facial bones: No displaced fractures or dislocations. Sinuses/Orbits: No acute finding. Other: Patient is edentulous. CT CERVICAL SPINE FINDINGS Alignment: Postoperative straightening of the normal cervical lordosis. Skull  base and vertebrae: No acute fracture. No primary bone lesion or focal pathologic process. Soft tissues and spinal canal: No prevertebral fluid or swelling. No visible canal hematoma. Disc levels: Status post posterior laminectomy and fusion of C3-C6. Mild-to-moderate multilevel cervical disc degenerative change, worst at C6-C7. Upper chest: Negative. Other: None. IMPRESSION: 1. No acute intracranial pathology. Advanced small-vessel white matter disease, in keeping with patient age. 2. No displaced fractures or dislocations of the facial bones. 3. No fracture or static subluxation of the cervical spine. 4. Status post posterior laminectomy and fusion of C3-C6. Mild-to-moderate multilevel cervical disc degenerative change, worst at C6-C7. Electronically Signed   By: Fredricka Jenny M.D.   On: 01/28/2024 13:27   CT Maxillofacial Wo Contrast Result Date: 01/28/2024 CLINICAL DATA:  Syncope, fall, head injury EXAM: CT HEAD WITHOUT CONTRAST CT MAXILLOFACIAL WITHOUT CONTRAST CT CERVICAL SPINE WITHOUT CONTRAST TECHNIQUE: Multidetector CT imaging of the head, cervical spine, and maxillofacial structures were performed using the standard protocol without intravenous contrast. Multiplanar CT image reconstructions of the cervical spine and maxillofacial structures were also generated. RADIATION DOSE REDUCTION: This exam was performed according to the departmental dose-optimization program which includes automated exposure control, adjustment of the mA and/or kV according to patient size and/or use of iterative reconstruction technique. COMPARISON:  08/13/2022 FINDINGS: CT HEAD FINDINGS Brain: No evidence of acute infarction, hemorrhage, hydrocephalus, extra-axial collection or mass lesion/mass effect. Extensive periventricular and deep white matter hypodensity. Vascular: No hyperdense vessel or unexpected calcification. CT FACIAL BONES FINDINGS Skull: Normal. Negative for fracture or focal lesion. Facial bones: No displaced  fractures or dislocations. Sinuses/Orbits: No acute finding. Other: Patient is edentulous. CT CERVICAL SPINE FINDINGS Alignment: Postoperative straightening of the normal cervical lordosis. Skull base and vertebrae: No acute fracture. No primary bone lesion or focal pathologic process. Soft tissues and spinal canal: No prevertebral fluid or swelling. No visible canal hematoma. Disc levels: Status post posterior laminectomy and fusion of C3-C6. Mild-to-moderate multilevel cervical disc degenerative change, worst at C6-C7. Upper chest: Negative. Other: None. IMPRESSION: 1. No acute intracranial pathology. Advanced small-vessel white matter disease, in keeping with patient age. 2. No displaced fractures or dislocations of the facial bones. 3. No fracture or static subluxation of the cervical spine. 4. Status post posterior laminectomy and fusion of C3-C6. Mild-to-moderate multilevel cervical disc degenerative change, worst at C6-C7. Electronically Signed   By: Fredricka Jenny M.D.   On: 01/28/2024 13:27   CT Cervical Spine Wo Contrast Result Date: 01/28/2024 CLINICAL DATA:  Syncope, fall, head injury EXAM: CT HEAD WITHOUT CONTRAST CT MAXILLOFACIAL WITHOUT CONTRAST CT CERVICAL SPINE WITHOUT CONTRAST TECHNIQUE: Multidetector CT imaging of the head, cervical spine, and maxillofacial structures were performed using the standard protocol without intravenous contrast. Multiplanar CT image reconstructions of the cervical spine and maxillofacial structures were also generated. RADIATION DOSE REDUCTION: This exam  was performed according to the departmental dose-optimization program which includes automated exposure control, adjustment of the mA and/or kV according to patient size and/or use of iterative reconstruction technique. COMPARISON:  08/13/2022 FINDINGS: CT HEAD FINDINGS Brain: No evidence of acute infarction, hemorrhage, hydrocephalus, extra-axial collection or mass lesion/mass effect. Extensive periventricular and  deep white matter hypodensity. Vascular: No hyperdense vessel or unexpected calcification. CT FACIAL BONES FINDINGS Skull: Normal. Negative for fracture or focal lesion. Facial bones: No displaced fractures or dislocations. Sinuses/Orbits: No acute finding. Other: Patient is edentulous. CT CERVICAL SPINE FINDINGS Alignment: Postoperative straightening of the normal cervical lordosis. Skull base and vertebrae: No acute fracture. No primary bone lesion or focal pathologic process. Soft tissues and spinal canal: No prevertebral fluid or swelling. No visible canal hematoma. Disc levels: Status post posterior laminectomy and fusion of C3-C6. Mild-to-moderate multilevel cervical disc degenerative change, worst at C6-C7. Upper chest: Negative. Other: None. IMPRESSION: 1. No acute intracranial pathology. Advanced small-vessel white matter disease, in keeping with patient age. 2. No displaced fractures or dislocations of the facial bones. 3. No fracture or static subluxation of the cervical spine. 4. Status post posterior laminectomy and fusion of C3-C6. Mild-to-moderate multilevel cervical disc degenerative change, worst at C6-C7. Electronically Signed   By: Fredricka Jenny M.D.   On: 01/28/2024 13:27    Microbiology: Results for orders placed or performed during the hospital encounter of 02/16/24  Blood Culture (routine x 2)     Status: None (Preliminary result)   Collection Time: 02/16/24  3:15 AM   Specimen: BLOOD  Result Value Ref Range Status   Specimen Description BLOOD SITE NOT SPECIFIED  Final   Special Requests   Final    BOTTLES DRAWN AEROBIC AND ANAEROBIC Blood Culture adequate volume   Culture   Final    NO GROWTH 3 DAYS Performed at [facility], [address] [place], [place] [address]    Report Status PENDING  Incomplete  Urine Culture     Status: Abnormal   Collection Time: 02/16/24  3:23 AM   Specimen: Urine, Random  Result Value Ref Range Status   Specimen Description URINE,  RANDOM  Final   Special Requests   Final    NONE Reflexed from [id number] Performed at [facility], [address], [place], [place] [address]    Culture (A)  Final    >=100,000 COLONIES/mL ESCHERICHIA COLI Confirmed Extended Spectrum Beta-Lactamase Producer (ESBL).  In bloodstream infections from ESBL organisms, carbapenems are preferred over piperacillin /tazobactam. They are shown to have a lower risk of mortality.    Report Status 02/18/2024 FINAL  Final   Organism ID, Bacteria ESCHERICHIA COLI (A)  Final      Susceptibility   Escherichia coli - MIC*    AMPICILLIN >=32 RESISTANT Resistant     CEFAZOLIN  >=64 RESISTANT Resistant     CEFEPIME 16 RESISTANT Resistant     CEFTRIAXONE  >=64 RESISTANT Resistant     CIPROFLOXACIN  >=4 RESISTANT Resistant     GENTAMICIN <=1 SENSITIVE Sensitive     IMIPENEM <=0.25 SENSITIVE Sensitive     NITROFURANTOIN <=16 SENSITIVE Sensitive     TRIMETH /SULFA  <=20 SENSITIVE Sensitive     AMPICILLIN/SULBACTAM >=32 RESISTANT Resistant     PIP/TAZO 8 SENSITIVE Sensitive ug/mL    * >=100,000 COLONIES/mL ESCHERICHIA COLI  Blood Culture (routine x 2)     Status: None (Preliminary result)   Collection Time: 02/16/24  4:53 AM   Specimen: BLOOD  Result Value Ref Range  Status   Specimen Description BLOOD SITE NOT SPECIFIED  Final   Special Requests   Final    BOTTLES DRAWN AEROBIC AND ANAEROBIC Blood Culture adequate volume   Culture   Final    NO GROWTH 3 DAYS Performed at [facility], [address] [place], [place] [address]    Report Status PENDING  Incomplete   Labs: CBC: Recent Labs  Lab 02/16/24 0315 02/16/24 0859 02/17/24 0457  WBC 15.1* 12.9* 10.9*  NEUTROABS 13.4*  --   --   HGB 10.6* 10.2* 9.2*  HCT 30.5* 29.6* 27.3*  MCV 99.0 100.7* 99.6  PLT 226 197 196   Basic Metabolic Panel: Recent Labs  Lab 02/16/24 0315 02/16/24 0859 02/17/24 0457  NA 126*  --  125*  K 4.7  --  4.4  CL 96*  --  99  CO2 17*  --  16*  GLUCOSE  105*  --  107*  BUN 120*  --  114*  CREATININE 2.76* 2.83* 2.64*  CALCIUM  8.8*  --  7.8*  MG 3.7*  --   --    Liver Function Tests: Recent Labs  Lab 02/16/24 0315  AST 22  ALT 19  ALKPHOS 66  BILITOT 0.7  PROT 6.2*  ALBUMIN  2.2*   CBG: No results for input(s): GLUCAP in the last 168 hours.  Discharge time spent: greater than 30 minutes.  Author: Charlean Congress, MD  [facility]

## 2024-02-20 ENCOUNTER — Ambulatory Visit: Payer: Self-pay | Admitting: Family Medicine

## 2024-02-20 ENCOUNTER — Telehealth: Payer: Self-pay | Admitting: Family Medicine

## 2024-02-20 NOTE — Telephone Encounter (Signed)
 Called his daughter about recent events, now with hospice care.  Goal for pain control and comfort care. I gave my condolences.   She thanked me for the call.  I appreciate hospice help.

## 2024-02-21 LAB — CULTURE, BLOOD (ROUTINE X 2)
Culture: NO GROWTH
Culture: NO GROWTH
Special Requests: ADEQUATE
Special Requests: ADEQUATE

## 2024-02-26 DIAGNOSIS — R339 Retention of urine, unspecified: Secondary | ICD-10-CM | POA: Insufficient documentation

## 2024-02-26 DIAGNOSIS — Z978 Presence of other specified devices: Secondary | ICD-10-CM | POA: Insufficient documentation

## 2024-02-26 NOTE — Progress Notes (Deleted)
 Name: [name] [name] DOB: 12/02/1928 MRN: [id number]  History of Present Illness: Mr. Court is a 89 y.o. male who presents today at [facility] [place].  Relevant History includes: 1. Prostate cancer.  2. BPH with LUTS (nocturia, weak stream, incomplete bladder emptying). Taking Rapaflo .  At last visit with Dr. Claretta Croft on 08/06/2023: Doing well.   Since last visit: > 01/28/2024 - 02/03/2024: Admitted for syncope and generalized weakness/deconditioning. Found to be in urinary retention with PVR >800 ml; indwelling Foley catheter placed.   > 02/16/2024 - 02/19/2024: Admitted for CAUTI with purulent drainage from Foley. Catheter was exchanged on 02/17/2024. Urine culture grew E coli (ESBL). Urology consulted. Treated with IV cefepime . He is now on comfort care / hospice.   ***continue cath permanently; can be exchanged by hospice nurse  Today: He reports the catheter is draining ***.  He {Actions; denies-reports:120008} gross hematuria.  He {Actions; denies-reports:120008} flank pain or abdominal pain. He {Actions; denies-reports:120008} fevers, nausea, or vomiting.   Medications: Current Outpatient Medications  Medication Sig Dispense Refill   acetaminophen  (TYLENOL ) 500 MG tablet Take 2 tablets (1,000 mg total) by mouth 3 (three) times daily as needed for mild pain or moderate pain.     HYDROmorphone  (DILAUDID ) 1 MG/ML injection Inject 1 mL (1 mg total) into the vein every 4 (four) hours.     LORazepam  (ATIVAN ) 2 MG/ML injection Inject 0.25-0.5 mLs (0.5-1 mg total) into the vein every hour as needed for anxiety, seizure or sedation (tremors).     mirtazapine  (REMERON ) 7.5 MG tablet TAKE 1 TABLET(7.5 MG) BY MOUTH AT BEDTIME. STOP TRAZODONE  WITH MIRTAZAPINE  USE 90 tablet 1   primidone  (MYSOLINE ) 50 MG tablet TAKE ONE-HALF (1/2) TABLET DAILY 45 tablet 3   silodosin  (RAPAFLO ) 8 MG CAPS capsule TAKE 1 CAPSULE AT BEDTIME 90 capsule 3   No current facility-administered  medications for this visit.    Allergies: No Known Allergies  Past Medical History:  Diagnosis Date   Acute renal failure (HCC) 12/06/2013   Back pain    occasionally   Basal cell carcinoma (BCC) of antihelix of left ear    Benign essential tremor 2005   takes Primidone  daily   Choledocholithiasis 12/06/2013   Constipation    takes OTC stool softener   Coronary artery disease 2007   s/p CABG, DR Stann Earnest   GERD (gastroesophageal reflux disease)    takes Nexium  daily   Gout 1995   ~ once/yr (03/17/2014)   History of colon polyps    Hyperlipidemia 12/2005   takes Atorvastatin  daily   Hypertension 1995   takes Lisinopril  and Coreg  daily   possible HCAP (healthcare-associated pneumonia) 12/09/2013   Prostate cancer (HCC) 2007   S/P treatment, followed by Urology prev   Right bundle branch block (RBBB)    stage 3b chronic kidney disease (HCC)    Stenosis of cervical spine    Type II diabetes mellitus (HCC)    no meds;diet and exercise controlled    Wears dentures    Wears glasses    Past Surgical History:  Procedure Laterality Date   Adenosine myoview   01/24/2006   Ischemia by EKG, Cath   CATARACT EXTRACTION W/ INTRAOCULAR LENS IMPLANT Left 10/2013   CHOLECYSTECTOMY N/A 12/06/2013   Procedure: Diagnostic Laparoscopy for  drainage Intrabdominal abcess;  Surgeon: Enid Harry, MD;  Location: [facility] OR;  Service: General;  Laterality: N/A;   CHOLECYSTECTOMY N/A 03/17/2014   Procedure: LAPAROSCOPIC CHOLECYSTECTOMY ;  Surgeon: Enid Harry, MD;  Location: [facility] OR;  Service: General;  Laterality: N/A;   COLONOSCOPY     CORONARY ARTERY BYPASS GRAFT  2007   CABGX 4   CYSTOSCOPY  02/15/2004   Mod BPH, moder tribec ?   ERCP N/A 12/06/2013   Procedure: ENDOSCOPIC RETROGRADE CHOLANGIOPANCREATOGRAPHY (ERCP);  Surgeon: Janel Medford, MD;  Location: [facility] OR;  Service: Endoscopy;  Laterality: N/A;   LAPAROSCOPIC CHOLECYSTECTOMY  03/17/2014   LUMBAR FUSION  02/2012   lumbar spine for  spinal stenosis   POSTERIOR CERVICAL FUSION/FORAMINOTOMY N/A 05/01/2020   Procedure: Posterior cervical fusion with lateral mass fixation - Cervical three - Cervical six, laminectomy Cervical three-six;  Surgeon: Isadora Mar, MD;  Location: [facility] OR;  Service: Neurosurgery;  Laterality: N/A;   PROSTATE CRYOABLATION  09/18/2005   prostate CA   Family History  Problem Relation Age of Onset   Heart disease Mother    Cancer Mother    Cancer Sister    Diabetes Sister    Cancer Brother        prostate   Prostate cancer Brother    Hypothyroidism Brother    Depression Neg Hx    Alcohol  abuse Neg Hx    Drug abuse Neg Hx    Stroke Neg Hx    Colon cancer Neg Hx    Social History   Socioeconomic History   Marital status: Married    Spouse name: Not on file   Number of children: 1   Years of education: Not on file   Highest education level: Not on file  Occupational History   Occupation: Retired Development worker, [employer]  Tobacco Use   Smoking status: Never   Smokeless tobacco: Never  Vaping Use   Vaping status: Never Used  Substance and Sexual Activity   Alcohol  use: No   Drug use: No   Sexual activity: Never  Other Topics Concern   Not on file  Social History Narrative   Widowed 2020 after 60+ years of marriage, lives with daughter.     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 [employer]   Agent orange exposure.  Sig service related noise exposure.     Right handed   Social Drivers of Health   Financial Resource Strain: Not on file  Food Insecurity: No Food Insecurity (02/18/2024)   Hunger Vital Sign    Worried About Running Out of Food in the Last Year: Never true    Ran Out of Food in the Last Year: Never true  Transportation Needs: No Transportation Needs (02/18/2024)   PRAPARE - Administrator, Civil Service (Medical): No    Lack of Transportation (Non-Medical): No  Physical Activity: Not on file  Stress: Not on file  Social Connections: Socially Isolated (02/18/2024)    Social Connection and Isolation Panel    Frequency of Communication with Friends and Family: More than three times a week    Frequency of Social Gatherings with Friends and Family: More than three times a week    Attends Religious Services: Never    Database administrator or Organizations: No    Attends Banker Meetings: Never    Marital Status: Widowed  Intimate Partner Violence: Not At Risk (02/18/2024)   Humiliation, Afraid, Rape, and Kick questionnaire    Fear of Current or Ex-Partner: No    Emotionally Abused: No    Physically Abused: No    Sexually Abused: No    Review of Systems Constitutional: Patient denies any unintentional  weight loss or change in strength lntegumentary: Patient denies any rashes or pruritus Cardiovascular: Patient denies chest pain or syncope Respiratory: Patient denies shortness of breath Gastrointestinal: ***Patient denies nausea, vomiting, constipation, or diarrhea ***As per HPI Musculoskeletal: Patient denies muscle cramps or weakness Neurologic: Patient denies convulsions or seizures Allergic/Immunologic: Patient denies recent allergic reaction(s) Hematologic/Lymphatic: Patient denies bleeding tendencies Endocrine: Patient denies heat/cold intolerance  GU: As per HPI.  OBJECTIVE There were no vitals filed for this visit. There is no height or weight on file to calculate BMI.  Physical Examination Constitutional: No obvious distress; patient is non-toxic appearing  Cardiovascular: No visible lower extremity edema.  Respiratory: The patient does not have audible wheezing/stridor; respirations do not appear labored  Gastrointestinal: Abdomen non-distended Musculoskeletal: Normal ROM of UEs  Skin: No obvious rashes/open sores  Neurologic: CN 2-12 grossly intact Psychiatric: Answered questions appropriately with normal affect  Hematologic/Lymphatic/Immunologic: No obvious bruises or sites of spontaneous bleeding  UA: ***negative  ***positive for *** leukocytes, *** blood, ***nitrites Urine microscopy: *** WBC/hpf, *** RBC/hpf, *** bacteria ***glucosuria (secondary to ***Jardiance ***Farxiga use) ***otherwise unremarkable  PVR: *** ml  ASSESSMENT No diagnosis found. ***  We agreed to plan for follow up in *** months / ***1 year or sooner if needed. Patient verbalized understanding of and agreement with current plan. All questions were answered.  PLAN Advised the following: 1. *** 2. ***No follow-ups on file.  No orders of the defined types were placed in this encounter.   It has been explained that the patient is to follow regularly with their PCP in addition to all other providers involved in their care and to follow instructions provided by these respective offices. Patient advised to contact urology clinic if any urologic-pertaining questions, concerns, new symptoms or problems arise in the interim period.  There are no Patient Instructions on file for this visit.  Electronically signed by:  Lauretta Ponto, FNP   02/26/24    11:28 AM

## 2024-03-01 ENCOUNTER — Ambulatory Visit: Admitting: Urology

## 2024-03-01 DIAGNOSIS — Z515 Encounter for palliative care: Secondary | ICD-10-CM

## 2024-03-01 DIAGNOSIS — N138 Other obstructive and reflux uropathy: Secondary | ICD-10-CM

## 2024-03-01 DIAGNOSIS — Z09 Encounter for follow-up examination after completed treatment for conditions other than malignant neoplasm: Secondary | ICD-10-CM

## 2024-03-01 DIAGNOSIS — N390 Urinary tract infection, site not specified: Secondary | ICD-10-CM

## 2024-03-01 DIAGNOSIS — R339 Retention of urine, unspecified: Secondary | ICD-10-CM

## 2024-03-01 DIAGNOSIS — Z978 Presence of other specified devices: Secondary | ICD-10-CM

## 2024-03-05 ENCOUNTER — Ambulatory Visit: Admitting: Urology

## 2024-03-09 ENCOUNTER — Ambulatory Visit: Admitting: Family Medicine

## 2024-03-09 ENCOUNTER — Ambulatory Visit

## 2024-03-09 DEATH — deceased

## 2024-08-11 ENCOUNTER — Ambulatory Visit: Payer: No Typology Code available for payment source | Admitting: Urology
